# Patient Record
Sex: Female | Born: 1995 | Race: Black or African American | Hispanic: No | Marital: Married | Smoking: Never smoker
Health system: Southern US, Community
[De-identification: ages and names within clinical notes are randomized; demographics above are authoritative.]

## PROBLEM LIST (undated history)

## (undated) DIAGNOSIS — I10 Essential (primary) hypertension: Secondary | ICD-10-CM

## (undated) DIAGNOSIS — N289 Disorder of kidney and ureter, unspecified: Secondary | ICD-10-CM

## (undated) DIAGNOSIS — D649 Anemia, unspecified: Secondary | ICD-10-CM

## (undated) DIAGNOSIS — N390 Urinary tract infection, site not specified: Secondary | ICD-10-CM

## (undated) DIAGNOSIS — G988 Other disorders of nervous system: Secondary | ICD-10-CM

## (undated) DIAGNOSIS — R519 Headache, unspecified: Secondary | ICD-10-CM

## (undated) DIAGNOSIS — E119 Type 2 diabetes mellitus without complications: Secondary | ICD-10-CM

## (undated) DIAGNOSIS — G709 Myoneural disorder, unspecified: Secondary | ICD-10-CM

## (undated) HISTORY — DX: Other disorders of nervous system: G98.8

## (undated) HISTORY — DX: Essential (primary) hypertension: I10

## (undated) HISTORY — PX: INSERTION OF DIALYSIS CATHETER: SHX1324

## (undated) HISTORY — PX: OTHER SURGICAL HISTORY: SHX169

---
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
## 2013-03-04 DIAGNOSIS — I10 Essential (primary) hypertension: Secondary | ICD-10-CM

## 2013-03-04 HISTORY — DX: Essential (primary) hypertension: I10

## 2013-03-06 DIAGNOSIS — F331 Major depressive disorder, recurrent, moderate: Secondary | ICD-10-CM | POA: Insufficient documentation

## 2013-04-13 DIAGNOSIS — G629 Polyneuropathy, unspecified: Secondary | ICD-10-CM | POA: Insufficient documentation

## 2013-04-13 DIAGNOSIS — R634 Abnormal weight loss: Secondary | ICD-10-CM | POA: Insufficient documentation

## 2015-05-03 DIAGNOSIS — Z975 Presence of (intrauterine) contraceptive device: Secondary | ICD-10-CM | POA: Insufficient documentation

## 2015-08-13 DIAGNOSIS — Z8619 Personal history of other infectious and parasitic diseases: Secondary | ICD-10-CM | POA: Insufficient documentation

## 2015-09-09 ENCOUNTER — Other Ambulatory Visit: Payer: Self-pay | Admitting: Emergency Medicine

## 2015-09-09 ENCOUNTER — Inpatient Hospital Stay
Admission: EM | Admit: 2015-09-09 | Discharge: 2015-09-10 | DRG: 637 | Disposition: A | Discharge status: Home / Self Care | Payer: Medicaid Other | Attending: Specialist | Admitting: Specialist

## 2015-09-09 ENCOUNTER — Emergency Department: Payer: Medicaid Other

## 2015-09-09 ENCOUNTER — Encounter: Payer: Self-pay | Admitting: Emergency Medicine

## 2015-09-09 DIAGNOSIS — E1010 Type 1 diabetes mellitus with ketoacidosis without coma: Principal | ICD-10-CM | POA: Diagnosis present

## 2015-09-09 DIAGNOSIS — Z833 Family history of diabetes mellitus: Secondary | ICD-10-CM | POA: Diagnosis not present

## 2015-09-09 DIAGNOSIS — Z9119 Patient's noncompliance with other medical treatment and regimen: Secondary | ICD-10-CM

## 2015-09-09 DIAGNOSIS — Z803 Family history of malignant neoplasm of breast: Secondary | ICD-10-CM

## 2015-09-09 DIAGNOSIS — D72829 Elevated white blood cell count, unspecified: Secondary | ICD-10-CM | POA: Diagnosis present

## 2015-09-09 DIAGNOSIS — E870 Hyperosmolality and hypernatremia: Secondary | ICD-10-CM | POA: Diagnosis present

## 2015-09-09 DIAGNOSIS — E860 Dehydration: Secondary | ICD-10-CM | POA: Diagnosis present

## 2015-09-09 DIAGNOSIS — N170 Acute kidney failure with tubular necrosis: Secondary | ICD-10-CM | POA: Diagnosis present

## 2015-09-09 DIAGNOSIS — Z794 Long term (current) use of insulin: Secondary | ICD-10-CM

## 2015-09-09 DIAGNOSIS — E871 Hypo-osmolality and hyponatremia: Secondary | ICD-10-CM | POA: Diagnosis present

## 2015-09-09 DIAGNOSIS — E1110 Type 2 diabetes mellitus with ketoacidosis without coma: Secondary | ICD-10-CM | POA: Diagnosis present

## 2015-09-09 HISTORY — DX: Type 2 diabetes mellitus without complications: E11.9

## 2015-09-09 LAB — BASIC METABOLIC PANEL
Anion gap: 12 (ref 5–15)
Anion gap: 10 (ref 5–15)
BUN: 23 mg/dL — AB (ref 6–20)
BUN: 21 mg/dL — AB (ref 6–20)
CALCIUM: 8.6 mg/dL — AB (ref 8.9–10.3)
CALCIUM: 8.5 mg/dL — AB (ref 8.9–10.3)
CO2: 16 mmol/L — AB (ref 22–32)
CO2: 19 mmol/L — AB (ref 22–32)
CREATININE: 0.87 mg/dL (ref 0.44–1.00)
CREATININE: 0.86 mg/dL (ref 0.44–1.00)
Chloride: 107 mmol/L (ref 101–111)
Chloride: 106 mmol/L (ref 101–111)
GFR calc Af Amer: >60 mL/min (ref >60)
GFR calc Af Amer: >60 mL/min (ref >60)
GFR calc non Af Amer: >60 mL/min (ref >60)
GFR calc non Af Amer: >60 mL/min (ref >60)
GLUCOSE: 257 mg/dL — AB (ref 65–99)
GLUCOSE: 194 mg/dL — AB (ref 65–99)
Potassium: 4.4 mmol/L (ref 3.5–5.1)
Potassium: 4.5 mmol/L (ref 3.5–5.1)
Sodium: 135 mmol/L (ref 135–145)
Sodium: 135 mmol/L (ref 135–145)

## 2015-09-09 LAB — CBC
HCT: 39.9 % (ref 35.0–47.0)
Hemoglobin: 13.3 g/dL (ref 12.0–16.0)
MCH: 30.6 pg (ref 26.0–34.0)
MCHC: 33.4 g/dL (ref 32.0–36.0)
MCV: 91.6 fL (ref 80.0–100.0)
PLATELETS: 435 10*3/uL (ref 150–440)
RBC: 4.36 MIL/uL (ref 3.80–5.20)
RDW: 12.9 % (ref 11.5–14.5)
WBC: 21.2 10*3/uL — ABNORMAL HIGH (ref 3.6–11.0)

## 2015-09-09 LAB — POCT PREGNANCY, URINE: Preg Test, Ur: NEGATIVE

## 2015-09-09 LAB — MRSA PCR SCREENING: MRSA by PCR: NEGATIVE

## 2015-09-09 LAB — GLUCOSE, CAPILLARY
GLUCOSE-CAPILLARY: 406 mg/dL — AB (ref 65–99)
GLUCOSE-CAPILLARY: 309 mg/dL — AB (ref 65–99)
GLUCOSE-CAPILLARY: 247 mg/dL — AB (ref 65–99)
GLUCOSE-CAPILLARY: 201 mg/dL — AB (ref 65–99)
GLUCOSE-CAPILLARY: 167 mg/dL — AB (ref 65–99)
GLUCOSE-CAPILLARY: 175 mg/dL — AB (ref 65–99)
GLUCOSE-CAPILLARY: 152 mg/dL — AB (ref 65–99)
GLUCOSE-CAPILLARY: 176 mg/dL — AB (ref 65–99)
Glucose-Capillary: 118 mg/dL — ABNORMAL HIGH (ref 65–99)
Glucose-Capillary: 122 mg/dL — ABNORMAL HIGH (ref 65–99)
Glucose-Capillary: 133 mg/dL — ABNORMAL HIGH (ref 65–99)
Glucose-Capillary: 198 mg/dL — ABNORMAL HIGH (ref 65–99)
Glucose-Capillary: 483 mg/dL — ABNORMAL HIGH (ref 65–99)

## 2015-09-09 LAB — HEMOGLOBIN A1C: Hgb A1c MFr Bld: 15.1 % — ABNORMAL HIGH (ref 4.0–6.0)

## 2015-09-09 MED ORDER — ONDANSETRON HCL 4 MG PO TABS: 4.0000 mg | ORAL_TABLET | Freq: Four times a day (QID) | ORAL | Status: DC | PRN

## 2015-09-09 MED ORDER — ONDANSETRON HCL 4 MG/2ML IJ SOLN
4.0000 mg | Freq: Four times a day (QID) | INTRAMUSCULAR | Status: DC | PRN
  Administered 2015-09-09: 4 mg via INTRAVENOUS
  Filled 2015-09-09: qty 2

## 2015-09-09 MED ORDER — LIVING WELL WITH DIABETES BOOK
Freq: Once | Status: AC
  Administered 2015-09-09: 09:00:00
  Filled 2015-09-09 (×2): qty 1

## 2015-09-09 MED ORDER — ONDANSETRON HCL 4 MG/2ML IJ SOLN
INTRAMUSCULAR | Status: AC
  Filled 2015-09-09: qty 2

## 2015-09-09 MED ORDER — SODIUM CHLORIDE 0.9 % IV SOLN
INTRAVENOUS | Status: DC
  Administered 2015-09-09: 4.3 [IU]/h via INTRAVENOUS
  Filled 2015-09-09: qty 2.5

## 2015-09-09 MED ORDER — ACETAMINOPHEN 650 MG RE SUPP: 650.0000 mg | Freq: Four times a day (QID) | RECTAL | Status: DC | PRN

## 2015-09-09 MED ORDER — SODIUM CHLORIDE 0.9 % IV SOLN: INTRAVENOUS | Status: DC

## 2015-09-09 MED ORDER — PNEUMOCOCCAL VAC POLYVALENT 25 MCG/0.5ML IJ INJ
0.5000 mL | INJECTION | INTRAMUSCULAR | Status: AC
  Administered 2015-09-10: 0.5 mL via INTRAMUSCULAR
  Filled 2015-09-09: qty 0.5

## 2015-09-09 MED ORDER — ACETAMINOPHEN 325 MG PO TABS
650.0000 mg | ORAL_TABLET | Freq: Four times a day (QID) | ORAL | Status: DC | PRN
  Administered 2015-09-09 – 2015-09-10 (×3): 650 mg via ORAL
  Filled 2015-09-09 (×3): qty 2

## 2015-09-09 MED ORDER — METOCLOPRAMIDE HCL 5 MG/ML IJ SOLN
INTRAMUSCULAR | Status: AC
  Filled 2015-09-09: qty 2

## 2015-09-09 MED ORDER — SODIUM CHLORIDE 0.9 % IV BOLUS (SEPSIS)
1000.0000 mL | Freq: Once | INTRAVENOUS | Status: AC
  Administered 2015-09-09: 1000 mL via INTRAVENOUS

## 2015-09-09 MED ORDER — ONDANSETRON HCL 4 MG/2ML IJ SOLN
4.0000 mg | Freq: Once | INTRAMUSCULAR | Status: AC
  Administered 2015-09-09: 4 mg via INTRAVENOUS

## 2015-09-09 MED ORDER — FENTANYL CITRATE (PF) 100 MCG/2ML IJ SOLN
50.0000 ug | Freq: Once | INTRAMUSCULAR | Status: AC
  Administered 2015-09-09: 50 ug via INTRAVENOUS

## 2015-09-09 MED ORDER — SODIUM CHLORIDE 0.9 % IV SOLN
INTRAVENOUS | Status: DC
  Administered 2015-09-09: 4.2 [IU]/h via INTRAVENOUS
  Filled 2015-09-09: qty 2.5

## 2015-09-09 MED ORDER — SODIUM CHLORIDE 0.9 % IV SOLN: INTRAVENOUS | Status: AC

## 2015-09-09 MED ORDER — INSULIN DETEMIR 100 UNIT/ML ~~LOC~~ SOLN
16.0000 [IU] | Freq: Two times a day (BID) | SUBCUTANEOUS | Status: DC
  Administered 2015-09-09 – 2015-09-10 (×2): 16 [IU] via SUBCUTANEOUS
  Filled 2015-09-09 (×3): qty 0.16

## 2015-09-09 MED ORDER — DEXTROSE-NACL 5-0.45 % IV SOLN
INTRAVENOUS | Status: DC
  Administered 2015-09-09: 15:00:00 via INTRAVENOUS

## 2015-09-09 MED ORDER — POTASSIUM CHLORIDE 10 MEQ/100ML IV SOLN
10.0000 meq | INTRAVENOUS | Status: AC
  Administered 2015-09-09 (×2): 10 meq via INTRAVENOUS
  Filled 2015-09-09 (×2): qty 100

## 2015-09-09 MED ORDER — FENTANYL CITRATE (PF) 100 MCG/2ML IJ SOLN
INTRAMUSCULAR | Status: AC
  Filled 2015-09-09: qty 2

## 2015-09-09 MED ORDER — ENOXAPARIN SODIUM 40 MG/0.4ML ~~LOC~~ SOLN
40.0000 mg | SUBCUTANEOUS | Status: DC
  Administered 2015-09-09: 40 mg via SUBCUTANEOUS
  Filled 2015-09-09: qty 0.4

## 2015-09-09 MED ORDER — METOCLOPRAMIDE HCL 5 MG/ML IJ SOLN
10.0000 mg | Freq: Once | INTRAMUSCULAR | Status: AC
  Administered 2015-09-09: 10 mg via INTRAVENOUS

## 2015-09-09 NOTE — Care Management Note (Signed)
Case Management Note  Patient Details  Name: Emma Thomas MRN: [id number] Date of Birth: [birth year]  Subjective/Objective:    Case management referral received for [facility] verification. Patient DOES have an active Medicaid Policy.  Patient reports she just doesn't take her medication as prescribed. PCP is Dr. Derrel Nip. No additional needs identified.               Action/Plan:   Expected Discharge Date:                  Expected Discharge Plan:  Home/Self Care  In-House Referral:     Discharge planning Services  CM Consult  Post Acute Care Choice:    Choice offered to:     DME Arranged:    DME Agency:     HH Arranged:    HH Agency:     Status of Service:  In process, will continue to follow  Medicare Important Message Given:    Date Medicare IM Given:    Medicare IM give by:    Date Additional Medicare IM Given:    Additional Medicare Important Message give by:     If discussed at [place] of Stay Meetings, dates discussed:    Additional Comments:  Jolly Mango, RN 09/09/2015, 3:22 PM

## 2015-09-09 NOTE — ED Provider Notes (Signed)
[facility] Emergency Department Provider Note  ____________________________________________  Time seen: Approximately 6:15 AM  I have reviewed the triage vital signs and the nursing notes.   HISTORY  Chief Complaint Hyperglycemia    HPI Emma Thomas is a 19 y.o. female with a history of diabetes who comes in today with nausea and vomiting. The patient reports that she's been feeling this way for the last day. The patient also endorses some shortness of breath and some chest pain. The patient reports that she's had no diarrhea or abdominal pain no problems with her urination. When asked if she is taking her insulin she reports that she has not been taking her insulin like she is supposed to for a while. The patient's emesis is nonbilious and nonbloody. The patient reports he just does not feel well so she decided to come in for evaluation. The patient also endorses some increased thirst since yesterday as well. She's had no headache or blurred vision, no problems with urination.   Past Medical History  Diagnosis Date  . Diabetes mellitus without complication ([place])     There are no active problems to display for this patient.   History reviewed. No pertinent past surgical history.  Current Outpatient Rx  Name  Route  Sig  Dispense  Refill  . insulin aspart protamine- aspart (NOVOLOG MIX 70/30) (70-30) 100 UNIT/ML injection   Subcutaneous   Inject 40 Units into the skin daily with breakfast.         . insulin aspart protamine- aspart (NOVOLOG MIX 70/30) (70-30) 100 UNIT/ML injection   Subcutaneous   Inject 22 Units into the skin daily with supper.           Allergies Review of patient's allergies indicates no known allergies.  No family history on file.  Social History Social History  Substance Use Topics  . Smoking status: Never Smoker   . Smokeless tobacco: None  . Alcohol Use: No    Review of Systems Constitutional: No  fever/chills Eyes: No visual changes. ENT: No sore throat. Cardiovascular:  chest pain. Respiratory:  shortness of breath. Gastrointestinal: Nausea and vomiting with No abdominal pain,  No diarrhea.  No constipation. Genitourinary: Negative for dysuria. Musculoskeletal: back pain. Skin: Negative for rash. Neurological: Negative for headaches, focal weakness or numbness. 10-point ROS otherwise negative.  ____________________________________________   PHYSICAL EXAM:  VITAL SIGNS: ED Triage Vitals  Enc Vitals Group     BP 09/09/15 0615 140/88 mmHg     Pulse --      Resp 09/09/15 0615 22     Temp --      Temp Source 09/09/15 0615 Oral     SpO2 09/09/15 0615 98 %     Weight 09/09/15 0615 150 lb (68.04 kg)     Height 09/09/15 0615 5\' 2"  (1.575 m)     Head Cir --      Peak Flow --      Pain Score 09/09/15 0616 7     Pain Loc --      Pain Edu? --      Excl. in [place]? --     Constitutional: Alert and oriented. Well appearing and in moderate distress. Eyes: Conjunctivae are normal. PERRL. EOMI. Head: Atraumatic. Nose: No congestion/rhinnorhea. Mouth/Throat: Mucous membranes are moist.  Oropharynx non-erythematous. Cardiovascular: Tachycardia regular rhythm. Grossly normal heart sounds.  Good peripheral circulation. Respiratory: Normal respiratory effort.  No retractions. Lungs CTAB. Gastrointestinal: Soft and nontender. No distention. Positive bowel sounds  Musculoskeletal: No lower extremity tenderness nor edema.   Neurologic:  Normal speech and language. No gross focal neurologic deficits are appreciated.  Skin:  Skin is warm, dry and intact.  Psychiatric: Mood and affect are normal.   ____________________________________________   LABS (all labs ordered are listed, but only abnormal results are displayed)  Labs Reviewed  CBC  URINALYSIS COMPLETEWITH MICROSCOPIC ([place])  COMPREHENSIVE METABOLIC PANEL  LIPASE, BLOOD  TROPONIN I  BLOOD GAS, VENOUS  CBG MONITORING,  ED   ____________________________________________  EKG  ED ECG REPORT I, Loney Hering, the attending physician, personally viewed and interpreted this ECG.   Date: 09/09/2015  EKG Time: 616  Rate: 108  Rhythm: sinus tachycardia  Axis: none  Intervals: prolonged qtc  ST&T Change: none  ____________________________________________  RADIOLOGY  CXR: NAD ____________________________________________   PROCEDURES  Procedure(s) performed: None  Critical Care performed: Yes, see critical care note(s)  CRITICAL CARE Performed by: Charlesetta Ivory P   Total critical care time: 45 min  Critical care time was exclusive of separately billable procedures and treating other patients.  Critical care was necessary to treat or prevent imminent or life-threatening deterioration.  Critical care was time spent personally by me on the following activities: development of treatment plan with patient and/or surrogate as well as nursing, discussions with consultants, evaluation of patient's response to treatment, examination of patient, obtaining history from patient or surrogate, ordering and performing treatments and interventions, ordering and review of laboratory studies, ordering and review of radiographic studies, pulse oximetry and re-evaluation of patient's condition.  ____________________________________________   INITIAL IMPRESSION / ASSESSMENT AND PLAN / ED COURSE  Pertinent labs & imaging results that were available during my care of the patient were reviewed by me and considered in my medical decision making (see chart for details).  This is a 19 year old female with a history of diabetes who comes in today with vomiting and nausea as well as increased thirst and high blood sugars. My concern is the patient may be in diabetic ketoacidosis. We'll check some blood work, a urinalysis, a chest x-ray and reassess the patient. The patient will receive a liter of normal saline  while we are waiting the results of her blood work. The patient received a second Liter of NS. I will start the patient on an insulin drip and admit her to the hospitalist service. He received a dose of Reglan as well as a dose of Zofran. ____________________________________________   FINAL CLINICAL IMPRESSION(S) / ED DIAGNOSES  Final diagnoses:  Diabetic ketoacidosis without coma associated with type 1 diabetes mellitus ([place])      Loney Hering, MD 09/09/15 [phone]

## 2015-09-09 NOTE — H&P (Signed)
[facility] [place] NAME: Rylei Giasson    MR#:  [id number]  DATE OF BIRTH:  02-06-1996  DATE OF ADMISSION:  09/09/2015  PRIMARY CARE PHYSICIAN: [facility]   REQUESTING/REFERRING PHYSICIAN: Dr. Mariea Clonts  CHIEF COMPLAINT:   Chief Complaint  Patient presents with  . Hyperglycemia   acute DKA  HISTORY OF PRESENT ILLNESS:  Jamilee Volner  is a 19 y.o. female with a known history of type 1 diabetes who presents to the hospital due to ongoing nausea vomiting for the past day. Patient says that she has not been compliant in taking her insulin like she was supposed to. She has also not been checking her blood sugars on a regular basis. Given her worsening nausea vomiting and increasing thirst she came to the ER for further evaluation and was noted to be in acute diabetic ketoacidosis. Hospitalist services were contacted further treatment and evaluation. Patient denies any fevers chills chest pain, abdominal pain, diarrhea or any other sick contacts.  PAST MEDICAL HISTORY:   Past Medical History  Diagnosis Date  . Diabetes mellitus without complication ([place])     PAST SURGICAL HISTORY:  History reviewed. No pertinent past surgical history.  SOCIAL HISTORY:   Social History  Substance Use Topics  . Smoking status: Never Smoker   . Smokeless tobacco: Not on file  . Alcohol Use: No    FAMILY HISTORY:   Family History  Problem Relation Age of Onset  . Breast cancer Mother   . Diabetes type II Brother     DRUG ALLERGIES:  No Known Allergies  REVIEW OF SYSTEMS:   Review of Systems  Constitutional: Negative for fever and weight loss.  HENT: Negative for congestion, nosebleeds and tinnitus.   Eyes: Negative for blurred vision, double vision and redness.  Respiratory: Negative for cough, hemoptysis and shortness of breath.   Cardiovascular: Negative for chest pain, orthopnea, leg swelling and PND.   Gastrointestinal: Positive for nausea and vomiting. Negative for abdominal pain, diarrhea and melena.  Genitourinary: Negative for dysuria, urgency and hematuria.  Musculoskeletal: Negative for joint pain and falls.  Neurological: Negative for dizziness, tingling, sensory change, focal weakness, seizures, weakness and headaches.  Endo/Heme/Allergies: Negative for polydipsia. Does not bruise/bleed easily.  Psychiatric/Behavioral: Negative for depression and memory loss. The patient is not nervous/anxious.     MEDICATIONS AT HOME:   Prior to Admission medications   Medication Sig Start Date End Date Taking? Authorizing Provider  insulin aspart protamine- aspart (NOVOLOG MIX 70/30) (70-30) 100 UNIT/ML injection Inject 40 Units into the skin daily with breakfast.   Yes Historical Provider, MD  insulin aspart protamine- aspart (NOVOLOG MIX 70/30) (70-30) 100 UNIT/ML injection Inject 22 Units into the skin daily with supper.   Yes Historical Provider, MD      VITAL SIGNS:  Blood pressure 127/79, pulse 108, resp. rate 18, height 5\' 2"  (1.575 m), weight 68.04 kg (150 lb), SpO2 99 %.  PHYSICAL EXAMINATION:  Physical Exam  GENERAL:  19 y.o.-year-old patient lying in the bed with no acute distress.  EYES: Pupils equal, round, reactive to light and accommodation. No scleral icterus. Extraocular muscles intact.  HEENT: Head atraumatic, normocephalic. Oropharynx and nasopharynx clear. No oropharyngeal erythema, moist oral mucosa  NECK:  Supple, no jugular venous distention. No thyroid enlargement, no tenderness.  LUNGS: Normal breath sounds bilaterally, no wheezing, rales, rhonchi. No use of accessory muscles of respiration.  CARDIOVASCULAR: S1, S2 RRR. No murmurs,  rubs, gallops, clicks.  ABDOMEN: Soft, nontender, nondistended. Bowel sounds present. No organomegaly or mass.  EXTREMITIES: No pedal edema, cyanosis, or clubbing. + 2 pedal & radial pulses b/l.   NEUROLOGIC: Cranial nerves II through  XII are intact. No focal Motor or sensory deficits appreciated b/l PSYCHIATRIC: The patient is alert and oriented x 3. Good affect.  SKIN: No obvious rash, lesion, or ulcer.   LABORATORY PANEL:   CBC No results for input(s): WBC, HGB, HCT, PLT in the last 168 hours. ------------------------------------------------------------------------------------------------------------------  Chemistries  No results for input(s): NA, K, CL, CO2, GLUCOSE, BUN, CREATININE, CALCIUM, MG, AST, ALT, ALKPHOS, BILITOT in the last 168 hours.  Invalid input(s): GFRCGP ------------------------------------------------------------------------------------------------------------------  Cardiac Enzymes No results for input(s): TROPONINI in the last 168 hours. ------------------------------------------------------------------------------------------------------------------  RADIOLOGY:  No results found.   IMPRESSION AND PLAN:   20 year old female with past medical history of type 1 diabetes who presents to the hospital due to nausea vomiting and noted to be in acute diabetic ketoacidosis.  #1 acute DKA-this is likely secondary to patient's noncompliance. Patient has not been taking her insulin and following a strict diet. -We'll hydrate her with IV fluids, start insulin drip DKA protocol. Follow serial metabolic profiles. -Continue insulin drip until the anion gap closes. We'll add potassium to her IV fluids once it falls below 4.5. -I will get an endocrinology consult as patient wants to see somebody locally. -Check a hemoglobin A1c.  #2 leukocytosis-likely stress mediated. -No evidence of acute infectious source. Follow white cell count.  #3 acute renal failure-likely acute tubular necrosis from dehydration and DKA. -Hydrate her with IV fluids and follow BUN and creatinine.  #4 hyponatremia-likely hyperosmolar secondary to the hyperglycemia. -Should improve as the blood sugars correct.   All the  records are reviewed and case discussed with ED provider. Management plans discussed with the patient, family and they are in agreement.  CODE STATUS: Full  TOTAL Critical Care TIME TAKING CARE OF THIS PATIENT: 45 minutes.    Henreitta Leber M.D on 09/09/2015 at 9:06 AM  Between 7am to 6pm - Pager - [phone]  After 6pm go to [email or web address] - password [email or web address] [place] [facility]  Office  [phone]  CC: Primary care physician; [place]

## 2015-09-09 NOTE — Consult Note (Signed)
Endocrine Initial Consult Note Date of Consult: 09/09/2015  Consulting Service: [facility] Endocrinology  Consulting MD: Dr. Verdell Carmine  SUBJECTIVE: Reason for Consultation: DKA, uncontrolled diabetes The history was obtained from the chart. Patient was not arousable during interview.   History of Present Illness: Emma Thomas is a 19 y.o. female with uncontrolled type 1 DM and history of nonadherence admitted with nausea/vomiting and found to be in DKA. She was admitted to the ICU and is currently on IV insulin and fluids with anion gap closed. She was unable to provide any history. Per her RN, patient had received zofran not long before my visit. She is currently NPO. Fingerstick BG 133.  Per the EMR, she is on a regimen of mixed insulin 70/30 40 units before breakfast and 22 units before supper. The most recent Hb A1c was 15.1%.    ROS could not be completed due to patient's somnolence.   Patient Active Problem List   Diagnosis Date Noted  . DKA (diabetic ketoacidoses) ([place]) 09/09/2015     Past Medical History  Diagnosis Date  . Diabetes mellitus without complication ([place])    History reviewed. No pertinent past surgical history. Family History  Problem Relation Age of Onset  . Breast cancer Mother   . Diabetes type II Brother     Social History:  Social History  Substance Use Topics  . Smoking status: Never Smoker   . Smokeless tobacco: Not on file  . Alcohol Use: No    No Known Allergies   Medications:  70/30 mixed insulin, 40 units in am, 22 units in pm  OBJECTIVE: Temp:  [98.8 F (37.1 C)] 98.8 F (37.1 C) (10/24 1032) Pulse Rate:  [108-120] 109 (10/24 1700) Resp:  [16-25] 19 (10/24 1700) BP: (102-140)/(63-88) 102/65 mmHg (10/24 1700) SpO2:  [97 %-100 %] 98 % (10/24 1700) Weight:  [68.04 kg (150 lb)] 68.04 kg (150 lb) (10/24 0615)  Temp (24hrs), Avg:98.8 F (37.1 C), Min:98.8 F (37.1 C), Max:98.8 F (37.1 C)  Weight: 68.04 kg (150 lb)  Physical  Exam: limited Gen: lying in bed asleep, not arousable, mumbling but no coherent speech HEENT: NCAT, limited exam CVS: tachycardic, reg rhythm, no murmur Lung: CTAB Abd: soft, benign Extr: warm, well-perfused, no obvious lesions Skin: warm dry Neuro: could not assess  Labs: BMP Latest Ref Rng 09/09/2015 09/09/2015  Glucose 65 - 99 mg/dL 194(H) 257(H)  BUN 6 - 20 mg/dL 21(H) 23(H)  Creatinine 0.44 - 1.00 mg/dL 0.86 0.87  Sodium 135 - 145 mmol/L 135 135  Potassium 3.5 - 5.1 mmol/L 4.5 4.4  Chloride 101 - 111 mmol/L 106 107  CO2 22 - 32 mmol/L 19(L) 16(L)  Calcium 8.9 - 10.3 mg/dL 8.5(L) 8.6(L)    CBC Latest Ref Rng 09/09/2015  WBC 3.6 - 11.0 K/uL 21.2(H)  Hemoglobin 12.0 - 16.0 g/dL 13.3  Hematocrit 35.0 - 47.0 % 39.9  Platelets 150 - 440 K/uL 435   Component     Latest Ref Rng 09/09/2015  Hemoglobin A1C     4.0 - 6.0 % 15.1 (H)   Blood glucose values reviewed in glucose accordion view  ASSESSMENT:  1. DKA - resolved, anion gap now 12 2. Type 1 diabetes, uncontrolled  RECOMMENDATIONS:   Start Levemir 16 units subQ every 12 hours; overlap with insulin infusion by 2 hours then discontinue insulin gtt Check fingerstick BG hourly x 3 hours after insulin gtt discontinued Transition to [facility] and HS fingerstick bg Once diet is advanced, would start  Novolog 10 units with meals, hold if PO intake poor Continue IV fluids, D5 1/2 NS Plan reviewed with patient's nurse Will follow along and coordinate follow up post-discharge Thank you for allowing me to participate in this patient's care.   Atha Starks, MD [facility] Endocrinology

## 2015-09-09 NOTE — ED Notes (Addendum)
Pt to room 8 via EMS from home; c/o N/V, back pain and increased thirst since yesterday; last FSBS Sund am was 300's and took ds insulin at that time--none since; +BS, abd soft/nondist, c/o diffuse discomfort with palpation of abdomen; pt also reports "hasn't been taking insulin like she is supposed to"; when asked why, st "I just don't"

## 2015-09-09 NOTE — ED Notes (Signed)
Pt has completed two 1065ml bags of normal saline per order. See mar.

## 2015-09-09 NOTE — ED Notes (Signed)
Attempt to call report to ICU, RN unavailable to take report at this time.

## 2015-09-09 NOTE — ED Notes (Signed)
Attempts made by this RN and medic for second iv site unsuccessful.

## 2015-09-09 NOTE — ED Notes (Signed)
Blood gas sample obtained for resp  By this RN. Stacy from resp collected sample from room.

## 2015-09-09 NOTE — Progress Notes (Signed)
Patient was admitted with DKA and started on an insulin drip. In reviewing the chart, noted in H&P that patient "has not been compliant in taking her insulin like she was supposed to. She has also not been checking her blood sugars on a regular basis". Went to talk with patient regarding diabetes and home regimen for diabetes control. Patient kept her eyes closed the entire time I was visiting with her and only answered my questions and did not contribute anything extra to our conversation. Patient stated that she has Medicaid (which is not reflected in the chart at this time) and that gets her insulin from [facility]. Patient states that she takes 70/30 40 units with breakfast and 70/30 22 units with supper. Patient reports that she has everything she needs for diabetes management (insulin, syringes, glucometer, and testing supplies) and she does not have any problems with getting any of those things noted.  Patient reports that she sees Dr. Hoy Morn (PCP) for diabetes management. Inquired about an endocrinologist and patient states that she needs a local endocrinologist. Informed patient that Dr. Gabriel Carina is consulted to see her as an inpatient. Informed patient that diabetes coordinator will be following along while she is inpatient and that diabetes coordinator will revisit with her tomorrow. Patient verbalized understanding of information discussed and states that she has no questions at this time.  Thanks, Barnie Alderman, RN, MSN, CCRN, CDE Diabetes Coordinator Inpatient Diabetes Program [phone] (Team Pager from [place] to [place]) [phone] ([place] office) [phone] [facility] office) [phone] [facility] office)

## 2015-09-10 LAB — BASIC METABOLIC PANEL
ANION GAP: 9 (ref 5–15)
BUN: 16 mg/dL (ref 6–20)
CO2: 21 mmol/L — AB (ref 22–32)
Calcium: 8.4 mg/dL — ABNORMAL LOW (ref 8.9–10.3)
Chloride: 103 mmol/L (ref 101–111)
Creatinine, Ser: 0.65 mg/dL (ref 0.44–1.00)
GFR calc Af Amer: >60 mL/min (ref >60)
GFR calc non Af Amer: >60 mL/min (ref >60)
GLUCOSE: 235 mg/dL — AB (ref 65–99)
POTASSIUM: 4.1 mmol/L (ref 3.5–5.1)
Sodium: 133 mmol/L — ABNORMAL LOW (ref 135–145)

## 2015-09-10 LAB — GLUCOSE, CAPILLARY
GLUCOSE-CAPILLARY: 483 mg/dL — AB (ref 65–99)
GLUCOSE-CAPILLARY: 236 mg/dL — AB (ref 65–99)
Glucose-Capillary: 253 mg/dL — ABNORMAL HIGH (ref 65–99)

## 2015-09-10 LAB — CBC
HCT: 38.3 % (ref 35.0–47.0)
HEMOGLOBIN: 13 g/dL (ref 12.0–16.0)
MCH: 31.1 pg (ref 26.0–34.0)
MCHC: 34 g/dL (ref 32.0–36.0)
MCV: 91.4 fL (ref 80.0–100.0)
Platelets: 368 10*3/uL (ref 150–440)
RBC: 4.2 MIL/uL (ref 3.80–5.20)
RDW: 12.7 % (ref 11.5–14.5)
WBC: 9.6 10*3/uL (ref 3.6–11.0)

## 2015-09-10 MED ORDER — INSULIN ASPART 100 UNIT/ML ~~LOC~~ SOLN: 0.0000 [IU] | Freq: Three times a day (TID) | SUBCUTANEOUS | Status: DC

## 2015-09-10 MED ORDER — INSULIN ASPART 100 UNIT/ML ~~LOC~~ SOLN
0.0000 [IU] | Freq: Three times a day (TID) | SUBCUTANEOUS | Status: DC
  Administered 2015-09-10: 5 [IU] via SUBCUTANEOUS
  Administered 2015-09-10: 3 [IU] via SUBCUTANEOUS
  Filled 2015-09-10: qty 5
  Filled 2015-09-10: qty 3

## 2015-09-10 MED ORDER — INSULIN DETEMIR 100 UNIT/ML FLEXPEN: 16.0000 [IU] | PEN_INJECTOR | Freq: Two times a day (BID) | SUBCUTANEOUS | Status: DC

## 2015-09-10 MED ORDER — INSULIN ASPART 100 UNIT/ML ~~LOC~~ SOLN
8.0000 [IU] | Freq: Three times a day (TID) | SUBCUTANEOUS | Status: DC
  Administered 2015-09-10: 8 [IU] via SUBCUTANEOUS
  Filled 2015-09-10: qty 8

## 2015-09-10 MED ORDER — INSULIN ASPART 100 UNIT/ML FLEXPEN: 8.0000 [IU] | PEN_INJECTOR | Freq: Three times a day (TID) | SUBCUTANEOUS | Status: DC

## 2015-09-10 MED ORDER — INSULIN ASPART 100 UNIT/ML ~~LOC~~ SOLN: 0.0000 [IU] | Freq: Every day | SUBCUTANEOUS | Status: DC

## 2015-09-10 NOTE — Progress Notes (Addendum)
Patient discharge. Discharge instructions given - verbal and hard copy, patient verbalized information back, all questions answered. Friend at side. Ambulates w/o difficulty. No needs. Prescriptions given.  Patient aware to keep a log of her blood sugars. Patient did not want to wait for a wheelchair and ambulated to exit with friend at side.

## 2015-09-10 NOTE — Progress Notes (Signed)
Insulin drip off at 2000. Morning levimir given. Should go to floor today.

## 2015-09-10 NOTE — Discharge Summary (Signed)
[facility] NAME: Emma Thomas    MR#:  [id number]  DATE OF BIRTH:  [birth year]  DATE OF ADMISSION:  09/09/2015 ADMITTING PHYSICIAN: Henreitta Leber, MD  DATE OF DISCHARGE: 09/10/2015 12:32 PM  PRIMARY CARE PHYSICIAN: [facility]    ADMISSION DIAGNOSIS:  Diabetic ketoacidosis without coma associated with type 1 diabetes mellitus ([place]) [E10.10]  DISCHARGE DIAGNOSIS:  Active Problems:   DKA (diabetic ketoacidoses) ([place])   SECONDARY DIAGNOSIS:   Past Medical History  Diagnosis Date  . Diabetes mellitus without complication [facility])     HOSPITAL COURSE:   19 year old female with past medical history of type 1 diabetes who presents to the hospital due to nausea vomiting and noted to be in acute diabetic ketoacidosis.  #1 acute DKA-this is likely secondary to patient's noncompliance. Patient had not been taking her insulin and following a strict diet. -Patient was admitted to the hospital and started on aggressive IV fluids, insulin drip. After getting aggressive therapy her anion gap has closed. Her clinical symptoms of nausea and vomiting and thirst have improved. -Patient was seen by endocrinology and he recommended switching her from her insulin 75/25 to Levemir and NovoLog with meals. Patient's hemoglobin A1c was noted to be as high as 11. -Patient currently is tolerating diet well. She is being discharged on Levemir 16 units twice daily along with NovoLog with meals and follow up with endocrinology as an outpatient.  #2 leukocytosis-likely stress mediated and has normalized now. -No evidence of acute infectious source.  #3 acute renal failure-likely acute tubular necrosis from dehydration and DKA. -Improved and normalized with IV fluid hydration.  #4 hyponatremia-likely hyperosmolar secondary to the hyperglycemia. -Corrected as her hyperglycemia has improved  DISCHARGE CONDITIONS:    Stable  CONSULTS OBTAINED:  Treatment Team:  Judi Cong, MD Abby Percell Locus, MD  DRUG ALLERGIES:  No Known Allergies  DISCHARGE MEDICATIONS:   Discharge Medication List as of 09/10/2015 12:13 PM    START taking these medications   Details  insulin aspart (NOVOLOG FLEXPEN) 100 UNIT/ML FlexPen Inject 8 Units into the skin 3 (three) times daily with meals., Starting 09/10/2015, Until Discontinued, Print    Insulin Detemir (LEVEMIR FLEXPEN) 100 UNIT/ML Pen Inject 16 Units into the skin 2 (two) times daily., Starting 09/10/2015, Until Discontinued, Print      STOP taking these medications     insulin aspart protamine- aspart (NOVOLOG MIX 70/30) (70-30) 100 UNIT/ML injection      insulin aspart protamine- aspart (NOVOLOG MIX 70/30) (70-30) 100 UNIT/ML injection          DISCHARGE INSTRUCTIONS:   DIET:  Diabetic diet  DISCHARGE CONDITION:  Stable  ACTIVITY:  Activity as tolerated  OXYGEN:  Home Oxygen: No.   Oxygen Delivery: room air  DISCHARGE LOCATION:  home   If you experience worsening of your admission symptoms, develop shortness of breath, life threatening emergency, suicidal or homicidal thoughts you must seek medical attention immediately by calling 911 or calling your MD immediately  if symptoms less severe.  You Must read complete instructions/literature along with all the possible adverse reactions/side effects for all the Medicines you take and that have been prescribed to you. Take any new Medicines after you have completely understood and accpet all the possible adverse reactions/side effects.   Please note  You were cared for by a hospitalist during your hospital stay. If you have any questions about your discharge medications or the  care you received while you were in the hospital after you are discharged, you can call the unit and asked to speak with the hospitalist on call if the hospitalist that took care of you is not available. Once you  are discharged, your primary care physician will handle any further medical issues. Please note that NO REFILLS for any discharge medications will be authorized once you are discharged, as it is imperative that you return to your primary care physician (or establish a relationship with a primary care physician if you do not have one) for your aftercare needs so that they can reassess your need for medications and monitor your lab values.     Today   No complaints presently. no nausea, vomiting. off the insulin drip and tolerating by mouth well.  VITAL SIGNS:  Blood pressure 108/72, pulse 96, temperature 98 F (36.7 C), temperature source Oral, resp. rate 20, height 5\' 2"  (1.575 m), weight 68.04 kg (150 lb), SpO2 99 %.  I/O:   Intake/Output Summary (Last 24 hours) at 09/10/15 1436 Last data filed at 09/10/15 0200  Gross per 24 hour  Intake 1628.24 ml  Output   1250 ml  Net 378.24 ml    PHYSICAL EXAMINATION:   GENERAL:  19 y.o.-year-old patient lying in the bed with no acute distress.  EYES: Pupils equal, round, reactive to light and accommodation. No scleral icterus. Extraocular muscles intact.  HEENT: Head atraumatic, normocephalic. Oropharynx and nasopharynx clear.  NECK:  Supple, no jugular venous distention. No thyroid enlargement, no tenderness.  LUNGS: Normal breath sounds bilaterally, no wheezing, rales,rhonchi. No use of accessory muscles of respiration.  CARDIOVASCULAR: S1, S2 normal. No murmurs, rubs, or gallops.  ABDOMEN: Soft, non-tender, non-distended. Bowel sounds present. No organomegaly or mass.  EXTREMITIES: No pedal edema, cyanosis, or clubbing.  NEUROLOGIC: Cranial nerves II through XII are intact. No focal motor or sensory defecits b/l.  PSYCHIATRIC: The patient is alert and oriented x 3. Good affect.  SKIN: No obvious rash, lesion, or ulcer.   DATA REVIEW:   CBC  Recent Labs Lab 09/10/15 0552  WBC 9.6  HGB 13.0  HCT 38.3  PLT 368    Chemistries    Recent Labs Lab 09/10/15 0552  NA 133*  K 4.1  CL 103  CO2 21*  GLUCOSE 235*  BUN 16  CREATININE 0.65  CALCIUM 8.4*    Cardiac Enzymes No results for input(s): TROPONINI in the last 168 hours.  Microbiology Results  Results for orders placed or performed during the hospital encounter of 09/09/15  MRSA PCR Screening     Status: None   Collection Time: 09/09/15  6:07 AM  Result Value Ref Range Status   MRSA by PCR NEGATIVE NEGATIVE Final    Comment:        The GeneXpert MRSA Assay (FDA approved for NASAL specimens only), is one component of a comprehensive MRSA colonization surveillance program. It is not intended to diagnose MRSA infection nor to guide or monitor treatment for MRSA infections.     RADIOLOGY:  Dg Chest Portable 1 View  09/09/2015  CLINICAL DATA:  Sob and chest pain. Vomiting x 1 day. Back pain since getting sick. Diabetic. Stated her symptoms were because her blood surgar way high. EXAM: PORTABLE CHEST 1 VIEW COMPARISON:  None. FINDINGS: The heart size and mediastinal contours are within normal limits. Both lungs are clear. The visualized skeletal structures are unremarkable. IMPRESSION: No active disease. Electronically Signed   By: Lajean Manes  M.D.   On: 09/09/2015 09:35      Management plans discussed with the patient, family and they are in agreement.  CODE STATUS:     Code Status Orders        Start     Ordered   09/09/15 1103  Full code   Continuous     09/09/15 1102      TOTAL TIME TAKING CARE OF THIS PATIENT: 40 minutes.    Henreitta Leber M.D on 09/10/2015 at 2:36 PM  Between 7am to 6pm - Pager - [phone]  After 6pm go to [email or web address] - password [email or web address] [place] [facility]  Office  [phone]  CC: Primary care physician; [place]

## 2015-09-10 NOTE — Progress Notes (Signed)
S: Ms. Isenberg reports she is feeling better today but still no appetite. She denies nausea or vomiting but had a headache earlier. She did not eat breakfast. Transitioned from IV insulin to subQ yesterday pm. BG mildly elevated this am. No hypoglycemia events. She will likely be discharged today.  O:  Filed Vitals:   09/10/15 0300 09/10/15 0400 09/10/15 0500 09/10/15 0600  BP: 111/72 98/62 104/74   Pulse: 94 95 92 92  Temp:      TempSrc:      Resp: 17 16 17 13   Height:      Weight:      SpO2: 99% 97% 99% 99%   Gen: well-appearing, conversant CVS: RRR, s1, s2 no murmur Lung: CTAB Abd: soft, normoactive bowel sounds, nontender Extr: no edema, wwp Psych: good insight into her medical condition  BMP Latest Ref Rng 09/10/2015 09/09/2015 09/09/2015  Glucose 65 - 99 mg/dL 235(H) 194(H) 257(H)  BUN 6 - 20 mg/dL 16 21(H) 23(H)  Creatinine 0.44 - 1.00 mg/dL 0.65 0.86 0.87  Sodium 135 - 145 mmol/L 133(L) 135 135  Potassium 3.5 - 5.1 mmol/L 4.1 4.5 4.4  Chloride 101 - 111 mmol/L 103 106 107  CO2 22 - 32 mmol/L 21(L) 19(L) 16(L)  Calcium 8.9 - 10.3 mg/dL 8.4(L) 8.5(L) 8.6(L)   CBC Latest Ref Rng 09/10/2015 09/09/2015  WBC 3.6 - 11.0 K/uL 9.6 21.2(H)  Hemoglobin 12.0 - 16.0 g/dL 13.0 13.3  Hematocrit 35.0 - 47.0 % 38.3 39.9  Platelets 150 - 440 K/uL 368 435   Blood glucose reviewed in glucose accordion view   ASSESSMENT:  1. DKA - resolved  2. Type 1 diabetes, uncontrolled  RECOMMENDATIONS:   Continue Levemir 16 units subQ every 12 hours  Start Novolog 8 units TIDCC. Hold if not eating. Correction scale upon discharge: 3 units/50 BG>200 mg/dL Check fingerstick BG AC/HS Discontinue IV fluids including dextrose Will see her in follow up next week She will need prescriptions for the following upon discharge: Insulin pens, pen needles, test strips, lancets, glucometer  Thank you for allowing me to participate in this patients care. Atha Starks, MD [facility] Endocr[facility]

## 2015-09-10 NOTE — Progress Notes (Addendum)
Inpatient Diabetes Program Recommendations  AACE/ADA: New Consensus Statement on Inpatient Glycemic Control (2015)  Target Ranges:  Prepandial:   less than 140 mg/dL      Peak postprandial:   less than 180 mg/dL (1-2 hours)      Critically ill patients:  140 - 180 mg/dL  Results for Emma Thomas, Emma Thomas (MRN [id number]) as of 09/10/2015 07:50  Ref. Range 09/09/2015 15:56 09/09/2015 17:09 09/09/2015 18:07 09/09/2015 19:46 09/09/2015 21:05 09/09/2015 22:08 09/09/2015 23:47 09/10/2015 07:12  Glucose-Capillary Latest Ref Range: 65-99 mg/dL 175 (H) 133 (H) 122 (H) 118 (H) 152 (H) 176 (H) 198 (H) 236 (H)   Review of Glycemic Control  Diabetes history: DM1 Outpatient Diabetes medications: 70/30 40 units with breakfast and 70/30 22 units with supper Current orders for Inpatient glycemic control: Levemir 16 units BID  Inpatient Diabetes Program Recommendations: IV fluids: If patient is eating and drinking without any issues, please consider discontinuing dextrose in IV fluids. Correction (SSI): Patient has Type 1 diabetes and will require Novolog correction. Please order Novolog sensitive correction scale ACHS. Insulin - Meal Coverage: Patient has Type 1 diabetes and will require meal coverage. If patient is eating at least 50% of meals, please order Novolog 10 units TID with meals as recommended by Dr. Graceann Congress.  Note: Patient received Levemir 16 units on 09/09/15 at 17:42 and was transitioned off IV insulin 2 hours after receiving Levemir. Fasting glucose 236 mg/dl at 7:12 this morning and patient received Levemir 16 units at 6:09 am today. Since patient has Type 1 diabetes she has an absolute insulin deficiency and will require both correction and meal coverage insulin.   09/10/15@8 :10-Talked with Dr. Verdell Carmine regarding recommendations. Order given for Novolog sensitive correction scale only. Dr. Verdell Carmine plans to discharge patient today and will call Dr. Graceann Congress to discuss discharge plan for patient  (whether she will discharge back on 70/30 or change to Levemir and Novolog).  Talked with Maudie Mercury, RN to update on plan and orders.  Thanks, Barnie Alderman, RN, MSN, CCRN, CDE Diabetes Coordinator Inpatient Diabetes Program [phone] (Team Pager from [place] to [place]) [phone] ([place] office) [phone] [facility] office) [phone] [facility] office)

## 2015-09-19 LAB — BLOOD GAS, VENOUS
Acid-base deficit: 20.2 mmol/L — ABNORMAL HIGH (ref 0.0–2.0)
Bicarbonate: 9.4 mEq/L — ABNORMAL LOW (ref 21.0–28.0)
FIO2: 0.21
PH VEN: 7.05 — AB (ref 7.250–7.300)
Patient temperature: 37
pCO2, Ven: 34 mmHg — ABNORMAL LOW (ref 44.0–60.0)

## 2015-11-12 ENCOUNTER — Encounter: Payer: Self-pay | Admitting: Emergency Medicine

## 2015-11-12 ENCOUNTER — Emergency Department
Admission: EM | Admit: 2015-11-12 | Discharge: 2015-11-12 | Disposition: A | Discharge status: Home / Self Care | Payer: Medicaid Other | Attending: Emergency Medicine | Admitting: Emergency Medicine

## 2015-11-12 DIAGNOSIS — E669 Obesity, unspecified: Secondary | ICD-10-CM | POA: Insufficient documentation

## 2015-11-12 DIAGNOSIS — Z794 Long term (current) use of insulin: Secondary | ICD-10-CM | POA: Diagnosis not present

## 2015-11-12 DIAGNOSIS — E119 Type 2 diabetes mellitus without complications: Secondary | ICD-10-CM | POA: Diagnosis not present

## 2015-11-12 DIAGNOSIS — L0231 Cutaneous abscess of buttock: Secondary | ICD-10-CM | POA: Insufficient documentation

## 2015-11-12 MED ORDER — LIDOCAINE-EPINEPHRINE (PF) 1 %-1:200000 IJ SOLN
INTRAMUSCULAR | Status: AC
  Filled 2015-11-12: qty 30

## 2015-11-12 MED ORDER — LIDOCAINE-EPINEPHRINE (PF) 2 %-1:200000 IJ SOLN: 30.0000 mL | Freq: Once | INTRAMUSCULAR | Status: DC

## 2015-11-12 MED ORDER — OXYCODONE-ACETAMINOPHEN 7.5-325 MG PO TABS: 1.0000 | ORAL_TABLET | ORAL | Status: DC | PRN

## 2015-11-12 MED ORDER — LIDOCAINE HCL (PF) 1 % IJ SOLN
INTRAMUSCULAR | Status: AC
  Filled 2015-11-12: qty 5

## 2015-11-12 MED ORDER — SULFAMETHOXAZOLE-TRIMETHOPRIM 800-160 MG PO TABS: 1.0000 | ORAL_TABLET | Freq: Two times a day (BID) | ORAL | Status: DC

## 2015-11-12 NOTE — ED Notes (Signed)
Patient ambulatory to triage with steady gait, without difficulty or distress noted; pt reports abscess to right buttock since yesterday, hx of same

## 2015-11-12 NOTE — ED Notes (Signed)
Pt discharged home after verbalizing understanding of discharge instructions; nad noted. 

## 2015-11-12 NOTE — Discharge Instructions (Signed)
Percutaneous Abscess Drain, Care After °Refer to this sheet in the next few weeks. These instructions provide you with information on caring for yourself after your procedure. Your health care provider may also give you more specific instructions. Your treatment has been planned according to current medical practices, but problems sometimes occur. Call your health care provider if you have any problems or questions after your procedure. °WHAT TO EXPECT AFTER THE PROCEDURE °After your procedure, it is typical to have the following:  °· A small amount of discomfort in the area where the drainage tube was placed. °· A small amount of bruising around the area where the drainage tube was placed. °· Sleepiness and fatigue for the rest of the day from the medicines used. °HOME CARE INSTRUCTIONS °· Rest at home for 1-2 days following your procedure or as directed by your health care provider. °· If you go home right after the procedure, plan to have someone with you for 24 hours. °· Do not take a bath or shower for 24 hours after your procedure. °· Take medicines only as directed by your health care provider. Ask your health care provider when you can resume taking any normal medicines. °· Change bandages (dressings) as directed.   °· You may be told to record the amount of drainage from the bag every time you empty it. Follow your health care provider's directions for emptying the bag. Write down the amount of drainage, the date, and the time you emptied it. °· Call your health care provider when the drain is putting out less than 10 mL of drainage per day for 2-3 days in a row or as directed by your health care provider. °· Follow your health care provider's instructions for cleaning the drainage tube. You may need to clean the tube every day so that it does not clog. °SEEK MEDICAL CARE IF: °· You have increased bleeding (more than a small spot) from the site where the drainage tube was placed. °· You have redness,  swelling, or increasing pain around the site where the drainage tube was placed. °· You notice a discharge or bad smell coming from the site where the drainage tube was placed. °· You have a fever or chills.  °· You have pain that is not helped by medicine.   °SEEK IMMEDIATE MEDICAL CARE IF: °· There is leakage around the drainage tube. °· The drainage tube pulls out. °· You suddenly stop having drainage from the tube. °· You suddenly have blood in the drainage fluid. °· You become dizzy or faint. °· You develop a rash.   °· You have nausea or vomiting. °· You have difficulty breathing, feel short of breath, or feel faint.   °· You develop chest pain. °· You have problems with your speech or vision. °· You have trouble balancing or moving your arms or legs. °  °This information is not intended to replace advice given to you by your health care provider. Make sure you discuss any questions you have with your health care provider. °  °Document Released: 03/19/2014 Document Revised: 08/21/2014 Document Reviewed: 03/19/2014 °Elsevier Interactive Patient Education ©2016 [employer] Inc[employer] ° °

## 2015-11-12 NOTE — ED Provider Notes (Signed)
[facility] Emergency Department Provider Note  ____________________________________________  Time seen: Approximately 7:12 AM  I have reviewed the triage vital signs and the nursing notes.   HISTORY  Chief Complaint Abscess    HPI Emma Thomas is a 19 y.o. female patient complaining of pain and swelling to the right buttocks. Patient states she noticed a swelling yesterday. Patient states she has a history of abscess. Patient rated her pain discomfort as 8/10 no palliative measures taken for this complaint.   Past Medical History  Diagnosis Date  . Diabetes mellitus without complication [facility])     Patient Active Problem List   Diagnosis Date Noted  . DKA (diabetic ketoacidoses) ([place]) 09/09/2015    History reviewed. No pertinent past surgical history.  Current Outpatient Rx  Name  Route  Sig  Dispense  Refill  . insulin aspart (NOVOLOG FLEXPEN) 100 UNIT/ML FlexPen   Subcutaneous   Inject 8 Units into the skin 3 (three) times daily with meals.   15 mL   11   . Insulin Detemir (LEVEMIR FLEXPEN) 100 UNIT/ML Pen   Subcutaneous   Inject 16 Units into the skin 2 (two) times daily.   15 mL   11   . oxyCODONE-acetaminophen (PERCOCET) 7.5-325 MG tablet   Oral   Take 1 tablet by mouth every 4 (four) hours as needed for severe pain.   20 tablet   0   . sulfamethoxazole-trimethoprim (BACTRIM DS,SEPTRA DS) 800-160 MG tablet   Oral   Take 1 tablet by mouth 2 (two) times daily.   20 tablet   0     Allergies Review of patient's allergies indicates no known allergies.  Family History  Problem Relation Age of Onset  . Breast cancer Mother   . Diabetes type II Brother     Social History Social History  Substance Use Topics  . Smoking status: Never Smoker   . Smokeless tobacco: None  . Alcohol Use: No    Review of Systems Constitutional: No fever/chills Eyes: No visual changes. ENT: No sore throat. Cardiovascular: Denies chest  pain. Respiratory: Denies shortness of breath. Gastrointestinal: No abdominal pain.  No nausea, no vomiting.  No diarrhea.  No constipation. Genitourinary: Negative for dysuria. Musculoskeletal: Negative for back pain. Skin: Negative for rash. Swelling right buttocks. Neurological: Negative for headaches, focal weakness or numbness. Endocrine: Obese 10-point ROS otherwise negative.  ____________________________________________   PHYSICAL EXAM:  VITAL SIGNS: ED Triage Vitals  Enc Vitals Group     BP 11/12/15 0634 117/75 mmHg     Pulse Rate 11/12/15 0634 100     Resp 11/12/15 0634 18     Temp 11/12/15 0634 97.9 F (36.6 C)     Temp Source 11/12/15 0634 Oral     SpO2 11/12/15 0634 100 %     Weight 11/12/15 0634 160 lb (72.576 kg)     Height 11/12/15 0634 5\' 6"  (1.676 m)     Head Cir --      Peak Flow --      Pain Score 11/12/15 0633 8     Pain Loc --      Pain Edu? --      Excl. in [place]? --     Constitutional: Alert and oriented. Well appearing and in no acute distress. Eyes: Conjunctivae are normal. PERRL. EOMI. Head: Atraumatic. Nose: No congestion/rhinnorhea. Mouth/Throat: Mucous membranes are moist.  Oropharynx non-erythematous. Neck: No stridor. No cervical spine tenderness to palpation. Hematological/Lymphatic/Immunilogical: No cervical lymphadenopathy. Cardiovascular:  Normal rate, regular rhythm. Grossly normal heart sounds.  Good peripheral circulation. Respiratory: Normal respiratory effort.  No retractions. Lungs CTAB. Gastrointestinal: Soft and nontender. No distention. No abdominal bruits. No CVA tenderness. Musculoskeletal: No lower extremity tenderness nor edema.  No joint effusions. Neurologic:  Normal speech and language. No gross focal neurologic deficits are appreciated. No gait instability. Skin:  Skin is warm, dry and intact. No rash noted. Nodule lesion right buttocks Psychiatric: Mood and affect are normal. Speech and behavior are  normal.  ____________________________________________   LABS (all labs ordered are listed, but only abnormal results are displayed)  Labs Reviewed  WOUND CULTURE   ____________________________________________  EKG   ____________________________________________  RADIOLOGY   ____________________________________________   PROCEDURES  Procedure(s) performed: See procedure note  Critical Care performed: No  __________________INCISION AND DRAINAGE Performed by: Sable Feil Consent: Verbal consent obtained. Risks and benefits: risks, benefits and alternatives were discussed Type: abscess  Body area: Right buttocks Anesthesia: local infiltration  Incision was made with a scalpel.  Local anesthetic: lidocaine 1% with epinephrine  Anesthetic total: 8 mL ml  Complexity: complex Blunt dissection to break up loculations  Drainage: purulent.   Drainage amount: Small Packing material: 1/4 in iodoform gauze  Patient tolerance: Patient tolerated the procedure well with no immediate complications.   __________________________   INITIAL IMPRESSION / ASSESSMENT AND PLAN / ED COURSE  Pertinent labs & imaging results that were available during my care of the patient were reviewed by me and considered in my medical decision making (see chart for details).  Abscess right buttocks. Area was I&D. Cultures were taken. She given discharge Instructions and a prescription for Percocet and Bactrim. Patient advised to follow-up 20 file for wound check. ____________________________________________   FINAL CLINICAL IMPRESSION(S) / ED DIAGNOSES  Final diagnoses:  Cutaneous abscess of buttock      Sable Feil, PA-C 11/12/15 0800  Earleen Newport, MD 11/12/15 [phone]

## 2015-11-12 NOTE — ED Notes (Signed)
Pt presents with abscess to buttock. Pt has had these in the past. Complains that unable to sit down on it and it has gotten larger in last 24 hours.

## 2015-11-17 LAB — WOUND CULTURE

## 2015-11-18 NOTE — Progress Notes (Signed)
ED Antimicrobial Stewardship Positive Culture Follow Up   Emma Thomas is an 20 y.o. female who presented to [facility] on 11/12/2015 with a chief complaint of  Chief Complaint  Patient presents with  . Abscess    Recent Results (from the past 720 hour(s))  Wound culture     Status: None   Collection Time: 11/12/15  7:52 AM  Result Value Ref Range Status   Specimen Description BUTTOCKS  Final   Special Requests BUTTOCK  Final   Gram Stain MODERATE WBC SEEN RARE GRAM NEGATIVE RODS   Final   Culture   Final    LIGHT GROWTH GROUP B STREP(S.AGALACTIAE)ISOLATED Virtually 100% of S. agalactiae (Group B) strains are susceptible to Penicillin.  For Penicillin-allergic patients, Erythromycin (85-95% sensitive) and Clindamycin (80% sensitive) are drugs of choice. Contact microbiology lab to request sensitivities if  needed within 7 days. WITH NORMAL SKIN FLORA    Report Status 11/17/2015 FINAL  Final    [x]  Treated with sulfamethoxazole-trimethoprim 800/160 1 tab PO BID x 10 days, organism resistant to prescribed antimicrobial []  Patient discharged originally without antimicrobial agent and treatment is now indicated  New antibiotic prescription: Faxed results to patient's PCP Ishmael Holter [place]) to consider transitioning to Amoxicillin 500 mg PO BID x 10 days if they felt appropriate.     Gricel Copen G 11/18/2015, 2:22 PM

## 2016-01-11 ENCOUNTER — Emergency Department
Admission: EM | Admit: 2016-01-11 | Discharge: 2016-01-12 | Disposition: A | Discharge status: Home / Self Care | Payer: Medicaid Other | Attending: Emergency Medicine | Admitting: Emergency Medicine

## 2016-01-11 ENCOUNTER — Encounter: Payer: Self-pay | Admitting: Emergency Medicine

## 2016-01-11 DIAGNOSIS — L0291 Cutaneous abscess, unspecified: Secondary | ICD-10-CM

## 2016-01-11 DIAGNOSIS — L02416 Cutaneous abscess of left lower limb: Secondary | ICD-10-CM | POA: Insufficient documentation

## 2016-01-11 DIAGNOSIS — R739 Hyperglycemia, unspecified: Secondary | ICD-10-CM

## 2016-01-11 DIAGNOSIS — E1165 Type 2 diabetes mellitus with hyperglycemia: Secondary | ICD-10-CM | POA: Insufficient documentation

## 2016-01-11 DIAGNOSIS — Z794 Long term (current) use of insulin: Secondary | ICD-10-CM | POA: Insufficient documentation

## 2016-01-11 LAB — COMPREHENSIVE METABOLIC PANEL
ALK PHOS: 109 U/L (ref 38–126)
ALT: 11 U/L — AB (ref 14–54)
AST: 14 U/L — ABNORMAL LOW (ref 15–41)
Albumin: 3.7 g/dL (ref 3.5–5.0)
Anion gap: 13 (ref 5–15)
BUN: 13 mg/dL (ref 6–20)
CALCIUM: 9.1 mg/dL (ref 8.9–10.3)
CHLORIDE: 94 mmol/L — AB (ref 101–111)
CO2: 21 mmol/L — ABNORMAL LOW (ref 22–32)
CREATININE: 0.76 mg/dL (ref 0.44–1.00)
Glucose, Bld: 569 mg/dL (ref 65–99)
Potassium: 4.4 mmol/L (ref 3.5–5.1)
Sodium: 128 mmol/L — ABNORMAL LOW (ref 135–145)
TOTAL PROTEIN: 7.6 g/dL (ref 6.5–8.1)
Total Bilirubin: 1.2 mg/dL (ref 0.3–1.2)

## 2016-01-11 LAB — CBC WITH DIFFERENTIAL/PLATELET
Basophils Absolute: 0.1 10*3/uL (ref 0–0.1)
Basophils Relative: 1 %
EOS PCT: 1 %
Eosinophils Absolute: 0.1 10*3/uL (ref 0–0.7)
HCT: 40.9 % (ref 35.0–47.0)
Hemoglobin: 13.7 g/dL (ref 12.0–16.0)
LYMPHS ABS: 1.3 10*3/uL (ref 1.0–3.6)
LYMPHS PCT: 12 %
MCH: 30.1 pg (ref 26.0–34.0)
MCHC: 33.6 g/dL (ref 32.0–36.0)
MCV: 89.9 fL (ref 80.0–100.0)
MONO ABS: 0.7 10*3/uL (ref 0.2–0.9)
Monocytes Relative: 7 %
Neutro Abs: 8.9 10*3/uL — ABNORMAL HIGH (ref 1.4–6.5)
Neutrophils Relative %: 79 %
PLATELETS: 342 10*3/uL (ref 150–440)
RBC: 4.55 MIL/uL (ref 3.80–5.20)
RDW: 12.8 % (ref 11.5–14.5)
WBC: 11.1 10*3/uL — ABNORMAL HIGH (ref 3.6–11.0)

## 2016-01-11 LAB — BLOOD GAS, VENOUS
Acid-base deficit: 4.2 mmol/L — ABNORMAL HIGH (ref 0.0–2.0)
Bicarbonate: 20.1 mEq/L — ABNORMAL LOW (ref 21.0–28.0)
O2 Saturation: 88.1 %
PCO2 VEN: 34 mmHg — AB (ref 44.0–60.0)
PH VEN: 7.38 (ref 7.320–7.430)
Patient temperature: 37
pO2, Ven: <31 mmHg (ref 30.0–45.0)

## 2016-01-11 LAB — GLUCOSE, CAPILLARY: Glucose-Capillary: 531 mg/dL — ABNORMAL HIGH (ref 65–99)

## 2016-01-11 MED ORDER — INSULIN ASPART 100 UNIT/ML ~~LOC~~ SOLN: 10.0000 [IU] | Freq: Once | SUBCUTANEOUS | Status: DC

## 2016-01-11 MED ORDER — SODIUM CHLORIDE 0.9 % IV BOLUS (SEPSIS)
1000.0000 mL | Freq: Once | INTRAVENOUS | Status: AC
  Administered 2016-01-11: 1000 mL via INTRAVENOUS

## 2016-01-11 MED ORDER — SULFAMETHOXAZOLE-TRIMETHOPRIM 800-160 MG PO TABS
1.0000 | ORAL_TABLET | Freq: Once | ORAL | Status: AC
  Administered 2016-01-12: 1 via ORAL
  Filled 2016-01-11: qty 1

## 2016-01-11 MED ORDER — LIDOCAINE-EPINEPHRINE 1 %-1:100000 IJ SOLN
10.0000 mL | Freq: Once | INTRAMUSCULAR | Status: AC
  Administered 2016-01-11: 10 mL

## 2016-01-11 MED ORDER — SODIUM CHLORIDE 0.9 % IV BOLUS (SEPSIS): 1000.0000 mL | Freq: Once | INTRAVENOUS | Status: DC

## 2016-01-11 MED ORDER — LIDOCAINE-EPINEPHRINE (PF) 1 %-1:200000 IJ SOLN
INTRAMUSCULAR | Status: AC
  Filled 2016-01-11: qty 30

## 2016-01-11 NOTE — ED Notes (Signed)
Pt states is diabetic and not taking her insulin.

## 2016-01-11 NOTE — ED Notes (Signed)
Pt with abscess to upper inner left thigh for two days, area is approx 1.5 inches in diameter, pt denies known fever, no drainage noted.

## 2016-01-11 NOTE — ED Provider Notes (Signed)
[facility] Emergency Department Provider Note  Time seen: 11:32 PM  I have reviewed the triage vital signs and the nursing notes.   HISTORY  Chief Complaint Abscess    HPI Emma Thomas is a 20 y.o. female with a past medical history of diabetes who presents the emergency department with elevated blood glucose and an abscess to her left thigh. According to the patient for the past 2 days she has noticed a painful red swollen area to her left medial thigh. Denies fever, nausea, vomiting. Patient is also noted her blood glucose has been in the 4-500 range. She states she ran out of insulin 3 days ago. She recently got a job and her Medicaid ran out, she has no way to obtain her insulin at this time. Denies any abdominal pain, chest pain. Describes the pain over the abscess on the left thigh as moderate, worse if she moves or touches it.     Past Medical History  Diagnosis Date  . Diabetes mellitus without complication [facility])     Patient Active Problem List   Diagnosis Date Noted  . DKA (diabetic ketoacidoses) ([place]) 09/09/2015    No past surgical history on file.  Current Outpatient Rx  Name  Route  Sig  Dispense  Refill  . insulin aspart (NOVOLOG FLEXPEN) 100 UNIT/ML FlexPen   Subcutaneous   Inject 8 Units into the skin 3 (three) times daily with meals.   15 mL   11   . Insulin Detemir (LEVEMIR FLEXPEN) 100 UNIT/ML Pen   Subcutaneous   Inject 16 Units into the skin 2 (two) times daily.   15 mL   11   . oxyCODONE-acetaminophen (PERCOCET) 7.5-325 MG tablet   Oral   Take 1 tablet by mouth every 4 (four) hours as needed for severe pain.   20 tablet   0   . sulfamethoxazole-trimethoprim (BACTRIM DS,SEPTRA DS) 800-160 MG tablet   Oral   Take 1 tablet by mouth 2 (two) times daily.   20 tablet   0     Allergies Review of patient's allergies indicates no known allergies.  Family History  Problem Relation Age of Onset  . Breast cancer  Mother   . Diabetes type II Brother     Social History Social History  Substance Use Topics  . Smoking status: Never Smoker   . Smokeless tobacco: Never Used  . Alcohol Use: No    Review of Systems Constitutional: Negative for fever. Cardiovascular: Negative for chest pain. Respiratory: Negative for shortness of breath. Gastrointestinal: Negative for abdominal pain, vomiting and diarrhea. Skin: Abscess to left thigh. Neurological: Negative for headache 10-point ROS otherwise negative.  ____________________________________________   PHYSICAL EXAM:  VITAL SIGNS: ED Triage Vitals  Enc Vitals Group     BP 01/11/16 2105 149/95 mmHg     Pulse Rate 01/11/16 2105 100     Resp 01/11/16 2105 18     Temp 01/11/16 2105 98.6 F (37 C)     Temp Source 01/11/16 2105 Oral     SpO2 01/11/16 2105 100 %     Weight 01/11/16 2105 144 lb (65.318 kg)     Height 01/11/16 2105 5\' 6"  (1.676 m)     Head Cir --      Peak Flow --      Pain Score 01/11/16 2106 8     Pain Loc --      Pain Edu? --      Excl. in [place]? --  Constitutional: Alert and oriented. Well appearing and in no distress. Eyes: Normal exam ENT   Head: Normocephalic and atraumatic   Mouth/Throat: Mucous membranes are moist. Cardiovascular: Normal rate, regular rhythm. No murmur Respiratory: Normal respiratory effort without tachypnea nor retractions. Breath sounds are clear  Gastrointestinal: Soft and nontender. No distention. Musculoskeletal: Nontender with normal range of motion in all extremities Neurologic:  Normal speech and language. No gross focal neurologic deficits  Skin:  1.5 x 2 cm area of erythema, swelling, tenderness, mild fluctuance of the left medial proximal thigh. Psychiatric: Mood and affect are normal. Speech and behavior are normal.  ____________________________________________     INITIAL IMPRESSION / ASSESSMENT AND PLAN / ED COURSE  Pertinent labs & imaging results that were available  during my care of the patient were reviewed by me and considered in my medical decision making (see chart for details).  Patient with likely 1.5 x 2 cm abscess to left medial proximal thigh. Mild fluctuance, we will evaluate with bedside ultrasound to ensure adequate fluid pocket for drainage. Patient has elevated blood glucose which is more likely related to the patient's noncompliance for the past 3 days. Patient ran out of insulin as her Medicaid ran out. Case management has worked with the patient has arranged for the patient to obtain insulin.   Incision and drainage performed by myself, approximately 15-20 cc of purulent exudate removed. We'll place patient on Bactrim. We'll recheck a fingerstick. Procedure well tolerated by patient.  Blood sugar currently 360 we'll continue with IV fluids and additional insulin dose into the blood glucose is under 300 and then the patient will be discharged home with Bactrim and a short course of pain medication. Case management is working with the patient to her insulin filled tomorrow.  Patient care signed out to Dr. Karma Greaser.     INCISION AND DRAINAGE Performed by: Harvest Dark Consent: Verbal consent obtained. Risks and benefits: risks, benefits and alternatives were discussed Type: abscess  Body area: left medial thight  Anesthesia: local infiltration  Incision was made with a scalpel.  Local anesthetic: lidocaine 1% with epinephrine  Anesthetic total: 8 ml  Complexity: complex Blunt dissection to break up loculations  Drainage: purulent  Drainage amount: 15cc purulent Patient tolerance: Patient tolerated the procedure well with no immediate complications.     ____________________________________________   FINAL CLINICAL IMPRESSION(S) / ED DIAGNOSES  Hyperglycemia Abscess   Harvest Dark, MD 01/12/16 0009

## 2016-01-11 NOTE — Care Management Note (Signed)
Case Management Note  Patient Details  Name: IMALAY BEMBENEK MRN: [id number] Date of Birth: [birth year]  Subjective/Objective:  20 y.o. F seen in the ED for elevated CBG ( 531). Reports she was using both Novolog and Humalin Insulin Pens but is "Losing her Medicaid" at the end of February and is no longer able to afford her medications.   Uses Novolog 8 Units with each meal. Will require Prescriptions for syringes, as well as, vial of Novolog at discharge so she can have this filled at her [facility] in [place] [phone])        Action/Plan: Will contact usual CM, Michelle in am for specific procedure in this county to [facility] patients requiring Medication assistance. No further CM needs at this time. Made patient as well as MD aware of plan.    Expected Discharge Date:                  Expected Discharge Plan:     In-House Referral:     Discharge planning Services     Post Acute Care Choice:    Choice offered to:     DME Arranged:    DME Agency:     HH Arranged:    [place] Agency:     Status of Service:     Medicare Important Message Given:    Date Medicare IM Given:    Medicare IM give by:    Date Additional Medicare IM Given:    Additional Medicare Important Message give by:     If discussed at [place] of Stay Meetings, dates discussed:    Additional Comments:  Delrae Sawyers, RN 01/11/2016, 11:03 PM

## 2016-01-11 NOTE — ED Notes (Signed)
Pt resting comfortably at this time.

## 2016-01-12 LAB — GLUCOSE, CAPILLARY
GLUCOSE-CAPILLARY: 308 mg/dL — AB (ref 65–99)
Glucose-Capillary: 370 mg/dL — ABNORMAL HIGH (ref 65–99)

## 2016-01-12 MED ORDER — HYDROCODONE-ACETAMINOPHEN 5-325 MG PO TABS: 1.0000 | ORAL_TABLET | ORAL | Status: DC | PRN

## 2016-01-12 MED ORDER — INSULIN ASPART 100 UNIT/ML ~~LOC~~ SOLN
6.0000 [IU] | Freq: Once | SUBCUTANEOUS | Status: AC
  Administered 2016-01-12: 6 [IU] via SUBCUTANEOUS
  Filled 2016-01-12: qty 6

## 2016-01-12 MED ORDER — SODIUM CHLORIDE 0.9 % IV BOLUS (SEPSIS)
1000.0000 mL | Freq: Once | INTRAVENOUS | Status: AC
Start: 2016-01-12 — End: 2016-01-12
  Administered 2016-01-12: 1000 mL via INTRAVENOUS

## 2016-01-12 MED ORDER — SULFAMETHOXAZOLE-TRIMETHOPRIM 800-160 MG PO TABS: 1.0000 | ORAL_TABLET | Freq: Two times a day (BID) | ORAL | Status: DC

## 2016-01-12 MED ORDER — HYDROCODONE-ACETAMINOPHEN 5-325 MG PO TABS
2.0000 | ORAL_TABLET | Freq: Once | ORAL | Status: AC
  Administered 2016-01-12: 2 via ORAL

## 2016-01-12 MED ORDER — HYDROCODONE-ACETAMINOPHEN 5-325 MG PO TABS
ORAL_TABLET | ORAL | Status: AC
  Administered 2016-01-12: 2 via ORAL
  Filled 2016-01-12: qty 2

## 2016-01-12 NOTE — Progress Notes (Addendum)
[place] letter completed. Will ascertain which pharmacies are participants in [place] area and fax to facilitate patient care. Spoke with Emma Thomas on the phone to keep her updated on the progress of [place] that we had talked about last pm. She was unable to tell me why the provider did not write Rx for Novolog Insulin last pm. CM suggested she call her PCP, [facility] in [place] to see if they had a provider on call that could call in her Prescription for  Novolog to a participating Pharmacy. If not she will have to call them first thing in am to avoid another ED visit. Made her aware that we would be unable to cover the cost of her pain meds Emma Thomas expressed understanding and appreciation. Understands she needs to complete all of antibiotics for infection.Also understands this is available once in 12 month period.  Will continue to follow.  Faxed [place] letter with follow up call to [facility] @ [facility] Phone [phone] fax: [phone]. Made Emma Thomas aware she could pick up her Rx there later today. No further CM needs.

## 2016-01-12 NOTE — ED Provider Notes (Signed)
-----------------------------------------   12:16 AM on 01/12/2016 -----------------------------------------   Blood pressure 149/95, pulse 100, temperature 98.6 F (37 C), temperature source Oral, resp. rate 18, height 5\' 6"  (1.676 m), weight 65.318 kg, last menstrual period 01/11/2016, SpO2 100 %.  Assuming care from Dr. Kerman Passey.  In short, Emma Thomas is a 20 y.o. female with a chief complaint of Abscess .  Refer to the original H&P for additional details.  The current plan of care is to monitor on fluids and discharge.  ----------------------------------------- 1:18 AM on 01/12/2016 -----------------------------------------  The patient's fingerstick blood sugars down to 308.  I authorized to [facility] for her pain.  We will discharge as per Dr. Lucilla Lame recommendations.  [name], MD 01/12/16 [phone]

## 2016-01-12 NOTE — Discharge Instructions (Signed)
Abscess An abscess (boil or furuncle) is an infected area on or under the skin. This area is filled with yellowish-white fluid (pus) and other material (debris). HOME CARE   Only take medicines as told by your doctor.  If you were given antibiotic medicine, take it as directed. Finish the medicine even if you start to feel better.  If gauze is used, follow your doctor's directions for changing the gauze.  To avoid spreading the infection:  Keep your abscess covered with a bandage.  Wash your hands well.  Do not share personal care items, towels, or whirlpools with others.  Avoid skin contact with others.  Keep your skin and clothes clean around the abscess.  Keep all doctor visits as told. GET HELP RIGHT AWAY IF:   You have more pain, puffiness (swelling), or redness in the wound site.  You have more fluid or blood coming from the wound site.  You have muscle aches, chills, or you feel sick.  You have a fever. MAKE SURE YOU:   Understand these instructions.  Will watch your condition.  Will get help right away if you are not doing well or get worse.   This information is not intended to replace advice given to you by your health care provider. Make sure you discuss any questions you have with your health care provider.   Document Released: 04/20/2008 Document Revised: 05/03/2012 Document Reviewed: 01/16/2012 Elsevier Interactive Patient Education 2016 Elsevier Inc.  Hyperglycemia High blood sugar (hyperglycemia) means that the level of sugar in your blood is higher than it should be. Signs of high blood sugar include:  Feeling thirsty.  Frequent peeing (urinating).  Feeling tired or sleepy.  Dry mouth.  Vision changes.  Feeling weak.  Feeling hungry but losing weight.  Numbness and tingling in your hands or feet.  Headache. When you ignore these signs, your blood sugar may keep going up. These problems may get worse, and other problems may  begin. HOME CARE  Check your blood sugars as told by your doctor. Write down the numbers with the date and time.  Take the right amount of insulin or diabetes pills at the right time. Write down the dose with date and time.  Refill your insulin or diabetes pills before running out.  Watch what you eat. Follow your meal plan.  Drink liquids without sugar, such as water. Check with your doctor if you have kidney or heart disease.  Follow your doctor's orders for exercise. Exercise at the same time of day.  Keep your doctor's appointments. GET HELP RIGHT AWAY IF:   You have trouble thinking or are confused.  You have fast breathing with fruity smelling breath.  You pass out (faint).  You have 2 to 3 days of high blood sugars and you do not know why.  You have chest pain.  You are feeling sick to your stomach (nauseous) or throwing up (vomiting).  You have sudden vision changes. MAKE SURE YOU:   Understand these instructions.  Will watch your condition.  Will get help right away if you are not doing well or get worse.   This information is not intended to replace advice given to you by your health care provider. Make sure you discuss any questions you have with your health care provider.   Document Released: 08/30/2009 Document Revised: 11/23/2014 Document Reviewed: 07/09/2015 Elsevier Interactive Patient Education [employer].

## 2016-01-12 NOTE — ED Notes (Signed)
Pt discharged to home.  Family member driving.  Discharge instructions reviewed.  Verbalized understanding.  No questions or concerns at this time.  Teach back verified.  Pt in NAD.  No items left in ED.   

## 2016-01-12 NOTE — Progress Notes (Signed)
Reviewed MATCH procedure for [facility] with Gary, the WE CM. Pt was discharged with pain meds and antibiotics. NO RX for Novolog and/or Syringes in discharge prescriptions. Will call pt later today and assess if she can make follow up appt with [facility] for Rxs. Unsure why Rxs were not written last pm as CM and MD had Face to face conversation about same. Will hold off on follow through until I can call [facility] or pt to provide closure.

## 2016-02-03 ENCOUNTER — Encounter: Payer: Self-pay | Admitting: Emergency Medicine

## 2016-02-03 ENCOUNTER — Inpatient Hospital Stay
Admission: EM | Admit: 2016-02-03 | Discharge: 2016-02-04 | DRG: 638 | Disposition: A | Discharge status: Home / Self Care | Payer: 59 | Attending: Internal Medicine | Admitting: Internal Medicine

## 2016-02-03 DIAGNOSIS — E860 Dehydration: Secondary | ICD-10-CM | POA: Diagnosis present

## 2016-02-03 DIAGNOSIS — E1110 Type 2 diabetes mellitus with ketoacidosis without coma: Secondary | ICD-10-CM | POA: Diagnosis present

## 2016-02-03 DIAGNOSIS — R112 Nausea with vomiting, unspecified: Secondary | ICD-10-CM

## 2016-02-03 DIAGNOSIS — E1010 Type 1 diabetes mellitus with ketoacidosis without coma: Principal | ICD-10-CM | POA: Diagnosis present

## 2016-02-03 DIAGNOSIS — Z794 Long term (current) use of insulin: Secondary | ICD-10-CM

## 2016-02-03 DIAGNOSIS — Z79899 Other long term (current) drug therapy: Secondary | ICD-10-CM

## 2016-02-03 DIAGNOSIS — Z833 Family history of diabetes mellitus: Secondary | ICD-10-CM

## 2016-02-03 DIAGNOSIS — Z803 Family history of malignant neoplasm of breast: Secondary | ICD-10-CM

## 2016-02-03 DIAGNOSIS — E871 Hypo-osmolality and hyponatremia: Secondary | ICD-10-CM | POA: Diagnosis present

## 2016-02-03 DIAGNOSIS — Z9114 Patient's other noncompliance with medication regimen: Secondary | ICD-10-CM

## 2016-02-03 LAB — BLOOD GAS, VENOUS
ACID-BASE DEFICIT: 24.5 mmol/L — AB (ref 0.0–2.0)
BICARBONATE: 6.7 meq/L — AB (ref 21.0–28.0)
O2 Saturation: 34.9 %
PATIENT TEMPERATURE: 37
PO2 VEN: 36 mmHg (ref 31.0–45.0)
pCO2, Ven: 30 mmHg — ABNORMAL LOW (ref 44.0–60.0)
pH, Ven: 6.96 — CL (ref 7.320–7.430)

## 2016-02-03 LAB — URINALYSIS COMPLETE WITH MICROSCOPIC (ARMC ONLY)
BILIRUBIN URINE: NEGATIVE
Bacteria, UA: NONE SEEN
Leukocytes, UA: NEGATIVE
Nitrite: NEGATIVE
PROTEIN: 100 mg/dL — AB
Specific Gravity, Urine: 1.021 (ref 1.005–1.030)
pH: 5 (ref 5.0–8.0)

## 2016-02-03 LAB — BASIC METABOLIC PANEL
Anion gap: 23 — ABNORMAL HIGH (ref 5–15)
Anion gap: 11 (ref 5–15)
BUN: 16 mg/dL (ref 6–20)
BUN: 17 mg/dL (ref 6–20)
CALCIUM: 9.5 mg/dL (ref 8.9–10.3)
CHLORIDE: 111 mmol/L (ref 101–111)
CO2: 8 mmol/L — AB (ref 22–32)
CO2: 12 mmol/L — ABNORMAL LOW (ref 22–32)
CREATININE: 1.06 mg/dL — AB (ref 0.44–1.00)
CREATININE: 0.74 mg/dL (ref 0.44–1.00)
Calcium: 8.7 mg/dL — ABNORMAL LOW (ref 8.9–10.3)
Chloride: 99 mmol/L — ABNORMAL LOW (ref 101–111)
GFR calc Af Amer: >60 mL/min (ref >60)
GFR calc Af Amer: >60 mL/min (ref >60)
GLUCOSE: 525 mg/dL — AB (ref 65–99)
Glucose, Bld: 197 mg/dL — ABNORMAL HIGH (ref 65–99)
Potassium: 4.4 mmol/L (ref 3.5–5.1)
Potassium: 4.1 mmol/L (ref 3.5–5.1)
SODIUM: 134 mmol/L — AB (ref 135–145)
Sodium: 130 mmol/L — ABNORMAL LOW (ref 135–145)

## 2016-02-03 LAB — CBC
HCT: 48.1 % — ABNORMAL HIGH (ref 35.0–47.0)
Hemoglobin: 15.6 g/dL (ref 12.0–16.0)
MCH: 30.2 pg (ref 26.0–34.0)
MCHC: 32.4 g/dL (ref 32.0–36.0)
MCV: 93.2 fL (ref 80.0–100.0)
PLATELETS: 416 10*3/uL (ref 150–440)
RBC: 5.16 MIL/uL (ref 3.80–5.20)
RDW: 13.6 % (ref 11.5–14.5)
WBC: 12 10*3/uL — ABNORMAL HIGH (ref 3.6–11.0)

## 2016-02-03 LAB — GLUCOSE, CAPILLARY
GLUCOSE-CAPILLARY: 452 mg/dL — AB (ref 65–99)
GLUCOSE-CAPILLARY: 204 mg/dL — AB (ref 65–99)
GLUCOSE-CAPILLARY: 184 mg/dL — AB (ref 65–99)
Glucose-Capillary: 164 mg/dL — ABNORMAL HIGH (ref 65–99)
Glucose-Capillary: 171 mg/dL — ABNORMAL HIGH (ref 65–99)
Glucose-Capillary: 175 mg/dL — ABNORMAL HIGH (ref 65–99)
Glucose-Capillary: 184 mg/dL — ABNORMAL HIGH (ref 65–99)
Glucose-Capillary: 217 mg/dL — ABNORMAL HIGH (ref 65–99)
Glucose-Capillary: 330 mg/dL — ABNORMAL HIGH (ref 65–99)
Glucose-Capillary: 422 mg/dL — ABNORMAL HIGH (ref 65–99)
Glucose-Capillary: 489 mg/dL — ABNORMAL HIGH (ref 65–99)

## 2016-02-03 LAB — MRSA PCR SCREENING: MRSA BY PCR: NEGATIVE

## 2016-02-03 LAB — POCT PREGNANCY, URINE: Preg Test, Ur: NEGATIVE

## 2016-02-03 LAB — MAGNESIUM: MAGNESIUM: 2 mg/dL (ref 1.7–2.4)

## 2016-02-03 MED ORDER — POTASSIUM CHLORIDE 10 MEQ/100ML IV SOLN
10.0000 meq | INTRAVENOUS | Status: DC
  Administered 2016-02-03: 10 meq via INTRAVENOUS
  Filled 2016-02-03 (×2): qty 100

## 2016-02-03 MED ORDER — ACETAMINOPHEN 650 MG RE SUPP: 650.0000 mg | Freq: Four times a day (QID) | RECTAL | Status: DC | PRN

## 2016-02-03 MED ORDER — SODIUM CHLORIDE 0.9 % IV BOLUS (SEPSIS): 1000.0000 mL | Freq: Once | INTRAVENOUS | Status: DC

## 2016-02-03 MED ORDER — ONDANSETRON HCL 4 MG PO TABS: 4.0000 mg | ORAL_TABLET | Freq: Four times a day (QID) | ORAL | Status: DC | PRN

## 2016-02-03 MED ORDER — ONDANSETRON HCL 4 MG/2ML IJ SOLN: 4.0000 mg | Freq: Four times a day (QID) | INTRAMUSCULAR | Status: DC | PRN

## 2016-02-03 MED ORDER — ACETAMINOPHEN 500 MG PO TABS
1000.0000 mg | ORAL_TABLET | Freq: Once | ORAL | Status: AC
  Administered 2016-02-03: 1000 mg via ORAL
  Filled 2016-02-03: qty 2

## 2016-02-03 MED ORDER — INSULIN REGULAR HUMAN 100 UNIT/ML IJ SOLN
INTRAMUSCULAR | Status: DC
  Administered 2016-02-03: 4.3 [IU]/h via INTRAVENOUS
  Filled 2016-02-03: qty 2.5

## 2016-02-03 MED ORDER — ONDANSETRON HCL 4 MG/2ML IJ SOLN
4.0000 mg | Freq: Once | INTRAMUSCULAR | Status: AC
Start: 2016-02-03 — End: 2016-02-03
  Administered 2016-02-03: 4 mg via INTRAVENOUS

## 2016-02-03 MED ORDER — DEXTROSE-NACL 5-0.45 % IV SOLN
INTRAVENOUS | Status: DC
  Administered 2016-02-03: 19:00:00 via INTRAVENOUS

## 2016-02-03 MED ORDER — ONDANSETRON HCL 4 MG/2ML IJ SOLN
INTRAMUSCULAR | Status: AC
  Administered 2016-02-03: 4 mg via INTRAVENOUS
  Filled 2016-02-03: qty 2

## 2016-02-03 MED ORDER — SODIUM CHLORIDE 0.9 % IV SOLN: INTRAVENOUS | Status: DC

## 2016-02-03 MED ORDER — ENOXAPARIN SODIUM 40 MG/0.4ML ~~LOC~~ SOLN
40.0000 mg | SUBCUTANEOUS | Status: DC
  Filled 2016-02-03: qty 0.4

## 2016-02-03 MED ORDER — SODIUM CHLORIDE 0.9 % IV SOLN
INTRAVENOUS | Status: DC
  Administered 2016-02-03: 4.7 [IU]/h via INTRAVENOUS
  Administered 2016-02-03: 7.2 [IU]/h via INTRAVENOUS
  Filled 2016-02-03: qty 2.5

## 2016-02-03 MED ORDER — SODIUM CHLORIDE 0.9 % IV BOLUS (SEPSIS)
1000.0000 mL | Freq: Once | INTRAVENOUS | Status: AC
  Administered 2016-02-03: 1000 mL via INTRAVENOUS

## 2016-02-03 MED ORDER — SODIUM CHLORIDE 0.9 % IV BOLUS (SEPSIS)
1000.0000 mL | Freq: Once | INTRAVENOUS | Status: AC
Start: 2016-02-03 — End: 2016-02-03
  Administered 2016-02-03: 1000 mL via INTRAVENOUS

## 2016-02-03 MED ORDER — POLYETHYLENE GLYCOL 3350 17 G PO PACK: 17.0000 g | PACK | Freq: Every day | ORAL | Status: DC | PRN

## 2016-02-03 MED ORDER — SODIUM CHLORIDE 0.9% FLUSH
3.0000 mL | Freq: Two times a day (BID) | INTRAVENOUS | Status: DC
  Administered 2016-02-03 (×2): 3 mL via INTRAVENOUS

## 2016-02-03 MED ORDER — ACETAMINOPHEN 325 MG PO TABS
650.0000 mg | ORAL_TABLET | Freq: Four times a day (QID) | ORAL | Status: DC | PRN
  Administered 2016-02-03: 650 mg via ORAL
  Filled 2016-02-03: qty 2

## 2016-02-03 NOTE — Progress Notes (Signed)
1730 admitted from ED. Noncompliant type 1 diabetic. Stable . On insulin drip.

## 2016-02-03 NOTE — ED Notes (Signed)
Pt to ed with c/o back pain, nausea and elevated blood sugar.  Pt with CBG of 452 at triage.  Pt skin warm and dry.

## 2016-02-03 NOTE — H&P (Signed)
[facility] NAME: Emma Thomas    MR#:  [id number]  DATE OF BIRTH:  [birth year]  DATE OF ADMISSION:  02/03/2016  PRIMARY CARE PHYSICIAN: [facility] [place]   REQUESTING/REFERRING PHYSICIAN: Dr. Mariea Clonts  CHIEF COMPLAINT:   Chief Complaint  Patient presents with  . Back Pain  . Hyperglycemia    HISTORY OF PRESENT ILLNESS:  Emma Thomas  is a 20 y.o. female with a known history of Insulin-dependent diabetes mellitus presents to the emergency room complaining of nausea, vomiting, abdominal pain. Patient has ran out of her Levemir since Friday. She normally takes 16 units twice a day and NovoLog 8 units 3 times a day with meals. Patient has not had any vomiting in the emergency room but continues to have abdominal pain and back pain. She has follow-up with endocrinology Dr. Lovett Calender on 02/06/2016. ABG shows pH of 7.96. Bicarbonate is 8. Blood sugars 545. She missed her Levemir as her pharmacy [facility] told her she needs prior authorization for her insulin.  PAST MEDICAL HISTORY:   Past Medical History  Diagnosis Date  . Diabetes mellitus without complication ([place])     PAST SURGICAL HISTORY:  History reviewed. No pertinent past surgical history.  SOCIAL HISTORY:   Social History  Substance Use Topics  . Smoking status: Never Smoker   . Smokeless tobacco: Never Used  . Alcohol Use: No    FAMILY HISTORY:   Family History  Problem Relation Age of Onset  . Breast cancer Mother   . Diabetes type II Brother     DRUG ALLERGIES:  No Known Allergies  REVIEW OF SYSTEMS:   Review of Systems  Constitutional: Positive for malaise/fatigue. Negative for fever, chills and weight loss.  HENT: Negative for hearing loss and nosebleeds.   Eyes: Negative for blurred vision, double vision and pain.  Respiratory: Negative for cough, hemoptysis, sputum production, shortness of breath and wheezing.   Cardiovascular:  Negative for chest pain, palpitations, orthopnea and leg swelling.  Gastrointestinal: Positive for nausea, vomiting and abdominal pain. Negative for diarrhea and constipation.  Genitourinary: Negative for dysuria and hematuria.  Musculoskeletal: Negative for myalgias, back pain and falls.  Skin: Negative for rash.  Neurological: Positive for dizziness and weakness. Negative for tremors, sensory change, speech change, focal weakness, seizures and headaches.  Endo/Heme/Allergies: Does not bruise/bleed easily.  Psychiatric/Behavioral: Negative for depression and memory loss. The patient is not nervous/anxious.     MEDICATIONS AT HOME:   Prior to Admission medications   Medication Sig Start Date End Date Taking? Authorizing Provider  insulin aspart (NOVOLOG FLEXPEN) 100 UNIT/ML FlexPen Inject 8 Units into the skin 3 (three) times daily with meals. 09/10/15  Yes Henreitta Leber, MD  Insulin Detemir (LEVEMIR FLEXPEN) 100 UNIT/ML Pen Inject 16 Units into the skin 2 (two) times daily. 09/10/15  Yes Henreitta Leber, MD  HYDROcodone-acetaminophen (NORCO/VICODIN) 5-325 MG tablet Take 1 tablet by mouth every 4 (four) hours as needed for moderate pain. Patient not taking: Reported on 02/03/2016 01/12/16   Harvest Dark, MD  oxyCODONE-acetaminophen (PERCOCET) 7.5-325 MG tablet Take 1 tablet by mouth every 4 (four) hours as needed for severe pain. Patient not taking: Reported on 02/03/2016 11/12/15 11/11/16  Sable Feil, PA-C  sulfamethoxazole-trimethoprim (BACTRIM DS,SEPTRA DS) 800-160 MG tablet Take 1 tablet by mouth 2 (two) times daily. Patient not taking: Reported on 02/03/2016 01/12/16   Harvest Dark, MD     VITAL SIGNS:  Last  menstrual period 01/11/2016.  PHYSICAL EXAMINATION:  Physical Exam  GENERAL:  20 y.o.-year-old patient lying in the bed with no acute distress.  EYES:  No scleral icterus. Extraocular muscles intact.  HEENT: Head atraumatic, normocephalic. Oropharynx and  nasopharynx clear. No oropharyngeal erythema, moist oral dry NECK:  Supple, no jugular venous distention. No thyroid enlargement, no tenderness.  LUNGS: Normal breath sounds bilaterally, no wheezing, rales, rhonchi. No use of accessory muscles of respiration.  CARDIOVASCULAR: S1, S2 normal. No murmurs, rubs, or gallops. Tachycardia ABDOMEN: Soft, nondistended. Bowel sounds present. No organomegaly or mass. Epigastric tenderness. No rigidity or guarding. EXTREMITIES: No pedal edema, cyanosis, or clubbing. + 2 pedal & radial pulses b/l.   NEUROLOGIC: Moves all 4 extremities equally. PSYCHIATRIC: The patient is alert and oriented x 3.   LABORATORY PANEL:   CBC  Recent Labs Lab 02/03/16 1129  WBC 12.0*  HGB 15.6  HCT 48.1*  PLT 416   ------------------------------------------------------------------------------------------------------------------  Chemistries   Recent Labs Lab 02/03/16 1129  NA 130*  K 4.4  CL 99*  CO2 8*  GLUCOSE 525*  BUN 16  CREATININE 1.06*  CALCIUM 9.5   ------------------------------------------------------------------------------------------------------------------  Cardiac Enzymes No results for input(s): TROPONINI in the last 168 hours. ------------------------------------------------------------------------------------------------------------------  RADIOLOGY:  No results found.   IMPRESSION AND PLAN:   * DKA with associated severe dehydration, pseudohyponatremia - due to missing her Levemir for 3 days. Heart aggressive IV fluid resuscitation with 3 L normal saline bolus. We'll continue normal saline at 200 ML per hour after that. Change fluids to D5 normal saline once blood sugars less than 250. Every 4 hours BMP. Potassium replacement. We will request case manager help for patient's Levemir. Patient has follow-up with endocrinology in 3 days.  Patient is critically ill with severe DKA with pH of 6.96. Will be admitted to ICU.   All  the records are reviewed and case discussed with ED provider. Management plans discussed with the patient, family and they are in agreement.  CODE STATUS: FULL CODE  TOTAL TIME TAKING CARE OF THIS PATIENT: 40 minutes.   Hillary Bow R M.D on 02/03/2016 at 2:35 PM  Between 7am to 6pm - Pager - [phone]  After 6pm go to [email or web address] - password [email or web address] [facility] [place] [facility]  Office  [phone]  CC: Primary care physician; [facility] [place]  Note: This dictation was prepared with Dragon dictation along with smaller phrase technology. Any transcriptional errors that result from this process are unintentional.

## 2016-02-03 NOTE — ED Provider Notes (Addendum)
[facility] Emergency Department Provider Note  ____________________________________________  Time seen: Approximately 1:24 PM  I have reviewed the triage vital signs and the nursing notes.   HISTORY  Chief Complaint Back Pain and Hyperglycemia    HPI Emma Thomas is a 20 y.o. female with a history of type 1 diabetes and multiple previous admissions for DKA presenting for "I think I have DKA." Patient states that she ran out of her insulin, last dose Thursday, and was unable to obtain more due to insurance issues. Over the past 2 days, she has developed polydipsia, low back pain, nausea and a single episode of vomiting in the emergency department.  Last bowel movement was several days ago and it is common for her to go several days without bowel movements. She denies any abdominal pain, cough or cold symptoms, fever or chills, urinary symptoms. LMP 2/24.   Past Medical History  Diagnosis Date  . Diabetes mellitus without complication [facility])     Patient Active Problem List   Diagnosis Date Noted  . DKA (diabetic ketoacidoses) ([place]) 09/09/2015    History reviewed. No pertinent past surgical history.  Current Outpatient Rx  Name  Route  Sig  Dispense  Refill  . HYDROcodone-acetaminophen (NORCO/VICODIN) 5-325 MG tablet   Oral   Take 1 tablet by mouth every 4 (four) hours as needed for moderate pain.   10 tablet   0   . insulin aspart (NOVOLOG FLEXPEN) 100 UNIT/ML FlexPen   Subcutaneous   Inject 8 Units into the skin 3 (three) times daily with meals.   15 mL   11   . Insulin Detemir (LEVEMIR FLEXPEN) 100 UNIT/ML Pen   Subcutaneous   Inject 16 Units into the skin 2 (two) times daily.   15 mL   11   . oxyCODONE-acetaminophen (PERCOCET) 7.5-325 MG tablet   Oral   Take 1 tablet by mouth every 4 (four) hours as needed for severe pain.   20 tablet   0   . sulfamethoxazole-trimethoprim (BACTRIM DS,SEPTRA DS) 800-160 MG tablet   Oral   Take 1  tablet by mouth 2 (two) times daily.   20 tablet   0     Allergies Review of patient's allergies indicates no known allergies.  Family History  Problem Relation Age of Onset  . Breast cancer Mother   . Diabetes type II Brother     Social History Social History  Substance Use Topics  . Smoking status: Never Smoker   . Smokeless tobacco: Never Used  . Alcohol Use: No    Review of Systems Constitutional: No fever/chills. No syncope or lightheadedness. Eyes: No visual changes. No eye discharge. ENT: No sore throat. No congestion or rhinorrhea. Cardiovascular: Denies chest pain, palpitations. Respiratory: Denies shortness of breath.  No cough. Gastrointestinal: No abdominal pain.  Positive nausea, positive vomiting.  No diarrhea.  Chronic constipation which is unchanged.  Genitourinary: Negative for dysuria. No hematuria. Musculoskeletal: Positive low back pain. Skin: Negative for rash. Neurological: Positive for mild headache. Negative for numbness tickling or weakness. No gait change.  10-point ROS otherwise negative.  ____________________________________________   PHYSICAL EXAM:  VITAL SIGNS: ED Triage Vitals  Enc Vitals Group     BP --      Pulse --      Resp --      Temp --      Temp src --      SpO2 --      Weight --  Height --      Head Cir --      Peak Flow --      Pain Score 02/03/16 1128 8     Pain Loc --      Pain Edu? --      Excl. in [place]? --     Constitutional: Patient is alert and oriented and answering questions appropriately. She appears clinically dehydrated, but nontoxic at this time.  Eyes: Conjunctivae are normal.  EOMI. no scleral icterus. No eye discharge. Head: Atraumatic. Nose: No congestion/rhinnorhea. Mouth/Throat: Mucous membranes are dry. Diffuse poor dentition but no evidence of acute abscess..  Neck: No stridor.  Supple.  No meningismus. Cardiovascular: Normal rate, regular rhythm. No murmurs, rubs or gallops.   Respiratory: Normal respiratory effort.  No retractions. Lungs CTAB.  No wheezes, rales or ronchi. Gastrointestinal: Abdomen is soft and nondistended. The patient has minimal tenderness to palpation in the left lower quadrant without palpable mass. No rebound or guarding. No peritoneal signs.  Musculoskeletal: No LE edema. Moves all extremities without pain. Neurologic:  Normal speech and language. No gross focal neurologic deficits are appreciated.  Skin:  Skin is warm, dry and intact. No rash noted. Psychiatric: Mood and affect are normal. Speech and behavior are normal.  Normal judgement.  ____________________________________________   LABS (all labs ordered are listed, but only abnormal results are displayed)  Labs Reviewed  GLUCOSE, CAPILLARY - Abnormal; Notable for the following:    Glucose-Capillary 452 (*)    All other components within normal limits  BASIC METABOLIC PANEL - Abnormal; Notable for the following:    Sodium 130 (*)    Chloride 99 (*)    CO2 8 (*)    Glucose, Bld 525 (*)    Creatinine, Ser 1.06 (*)    Anion gap 23 (*)    All other components within normal limits  CBC - Abnormal; Notable for the following:    WBC 12.0 (*)    HCT 48.1 (*)    All other components within normal limits  URINALYSIS COMPLETEWITH MICROSCOPIC ([facility] ONLY)  BLOOD GAS, VENOUS  CBG MONITORING, ED  POC URINE PREG, ED   ____________________________________________  EKG  ED ECG REPORT I, Eula Listen, the attending physician, personally viewed and interpreted this ECG.   Date: 02/03/2016  EKG Time: 1354  Rate: 110  Rhythm: sinus tachycardia  Axis: Normal  Intervals:Prolonged QtC  ST&T Change: No ST elevation.  ____________________________________________  RADIOLOGY  No results found.  ____________________________________________   PROCEDURES  Procedure(s) performed: None  Critical Care performed:  No ____________________________________________   INITIAL IMPRESSION / ASSESSMENT AND PLAN / ED COURSE  Pertinent labs & imaging results that were available during my care of the patient were reviewed by me and considered in my medical decision making (see chart for details).  20 y.o. female with a history of type I DM was not taken any insulin for 4 days presenting with thirst, nausea and vomiting. The patient's labs from triage due show hyperglycemia with an anion gap of 23; she is in DKA. The most likely etiology is the case noncompliance. She has some minimal left lower quadrant tenderness but also has not had a bowel movement in several days. At this time, acute infection such as diverticulitis or bowel obstruction is much less likely than medication noncompliance as a cause for her symptoms. We will also check her urine to rule out pregnancy and urinary tract infection. I will initiate a insulin drip and admit the patient  for further treatment.  ----------------------------------------- 1:57 PM on 02/03/2016 -----------------------------------------  The patient has been admitted to the hospital. Her gas shows a pH of 6.96.  ____________________________________________  FINAL CLINICAL IMPRESSION(S) / ED DIAGNOSES  Final diagnoses:  Diabetic ketoacidosis without coma associated with type 1 diabetes mellitus ([place])  Non-intractable vomiting with nausea, vomiting of unspecified type  Noncompliance with medications  Dehydration with hyponatremia      NEW MEDICATIONS STARTED DURING THIS VISIT:  New Prescriptions   No medications on file     Eula Listen, MD 02/03/16 1357  Eula Listen, MD 02/03/16 1408

## 2016-02-04 LAB — BASIC METABOLIC PANEL
ANION GAP: 4 — AB (ref 5–15)
ANION GAP: 6 (ref 5–15)
BUN: 12 mg/dL (ref 6–20)
BUN: 13 mg/dL (ref 6–20)
CALCIUM: 8.3 mg/dL — AB (ref 8.9–10.3)
CHLORIDE: 110 mmol/L (ref 101–111)
CO2: 17 mmol/L — ABNORMAL LOW (ref 22–32)
CO2: 16 mmol/L — AB (ref 22–32)
Calcium: 8.2 mg/dL — ABNORMAL LOW (ref 8.9–10.3)
Chloride: 112 mmol/L — ABNORMAL HIGH (ref 101–111)
Creatinine, Ser: 0.47 mg/dL (ref 0.44–1.00)
Creatinine, Ser: 0.67 mg/dL (ref 0.44–1.00)
GFR calc Af Amer: >60 mL/min (ref >60)
GLUCOSE: 176 mg/dL — AB (ref 65–99)
Glucose, Bld: 146 mg/dL — ABNORMAL HIGH (ref 65–99)
POTASSIUM: 3.3 mmol/L — AB (ref 3.5–5.1)
POTASSIUM: 3.9 mmol/L (ref 3.5–5.1)
SODIUM: 133 mmol/L — AB (ref 135–145)
Sodium: 132 mmol/L — ABNORMAL LOW (ref 135–145)

## 2016-02-04 LAB — GLUCOSE, CAPILLARY
GLUCOSE-CAPILLARY: 136 mg/dL — AB (ref 65–99)
GLUCOSE-CAPILLARY: 149 mg/dL — AB (ref 65–99)
GLUCOSE-CAPILLARY: 151 mg/dL — AB (ref 65–99)
GLUCOSE-CAPILLARY: 157 mg/dL — AB (ref 65–99)
GLUCOSE-CAPILLARY: 210 mg/dL — AB (ref 65–99)
Glucose-Capillary: 136 mg/dL — ABNORMAL HIGH (ref 65–99)
Glucose-Capillary: 246 mg/dL — ABNORMAL HIGH (ref 65–99)

## 2016-02-04 MED ORDER — INSULIN ASPART 100 UNIT/ML ~~LOC~~ SOLN: 0.0000 [IU] | Freq: Every day | SUBCUTANEOUS | Status: DC

## 2016-02-04 MED ORDER — INSULIN DETEMIR 100 UNIT/ML ~~LOC~~ SOLN
19.0000 [IU] | Freq: Once | SUBCUTANEOUS | Status: AC
  Administered 2016-02-04: 19 [IU] via SUBCUTANEOUS
  Filled 2016-02-04: qty 0.19

## 2016-02-04 MED ORDER — SODIUM CHLORIDE 0.9 % IV SOLN: 250.0000 mL | INTRAVENOUS | Status: DC | PRN

## 2016-02-04 MED ORDER — SODIUM CHLORIDE 0.9% FLUSH
3.0000 mL | Freq: Two times a day (BID) | INTRAVENOUS | Status: DC
  Administered 2016-02-04: 3 mL via INTRAVENOUS

## 2016-02-04 MED ORDER — SODIUM CHLORIDE 0.9% FLUSH: 3.0000 mL | INTRAVENOUS | Status: DC | PRN

## 2016-02-04 MED ORDER — OXYCODONE-ACETAMINOPHEN 5-325 MG PO TABS
1.0000 | ORAL_TABLET | Freq: Once | ORAL | Status: AC
  Administered 2016-02-04: 1 via ORAL

## 2016-02-04 MED ORDER — MEPERIDINE HCL 25 MG/ML IJ SOLN
25.0000 mg | Freq: Once | INTRAMUSCULAR | Status: AC
  Administered 2016-02-04: 25 mg via INTRAVENOUS
  Filled 2016-02-04: qty 1

## 2016-02-04 MED ORDER — OXYCODONE-ACETAMINOPHEN 5-325 MG PO TABS
ORAL_TABLET | ORAL | Status: AC
  Filled 2016-02-04: qty 1

## 2016-02-04 MED ORDER — POTASSIUM CHLORIDE CRYS ER 20 MEQ PO TBCR: 40.0000 meq | EXTENDED_RELEASE_TABLET | Freq: Once | ORAL | Status: DC

## 2016-02-04 MED ORDER — INSULIN ASPART 100 UNIT/ML ~~LOC~~ SOLN
0.0000 [IU] | Freq: Three times a day (TID) | SUBCUTANEOUS | Status: DC
  Administered 2016-02-04: 5 [IU] via SUBCUTANEOUS
  Filled 2016-02-04: qty 5

## 2016-02-04 MED ORDER — PROMETHAZINE HCL 25 MG/ML IJ SOLN
12.5000 mg | Freq: Once | INTRAMUSCULAR | Status: AC
  Administered 2016-02-04: 12.5 mg via INTRAVENOUS
  Filled 2016-02-04: qty 1

## 2016-02-04 MED ORDER — INSULIN LISPRO 100 UNIT/ML ~~LOC~~ SOLN: 5.0000 [IU] | Freq: Three times a day (TID) | SUBCUTANEOUS | Status: DC

## 2016-02-04 NOTE — Progress Notes (Signed)
Patient alert and oriented. Some complaints of headaches this am. Has subsided with medication. Up to bedside commode, ate breakfast. Patient has refused to take insulin for lunch. Explained the importance and she states that she will take at home when she eats. Does not want to stay at hospital for lunch. Did offer patient some crackers or sandwich but denied.Dr Posey Pronto sent prescriptions to patients pharmacy. Follow up with Endocrinology. Care Manager Moishe Spice has called grandfather to pick patient from hospital.

## 2016-02-04 NOTE — Progress Notes (Signed)
Pharmacy ICU Daily Progress Note  TH is a 20yo female admitted 02/03/16 for DKA 2/2 non-compliant type 1 diabetes.  PTA insulin regimen: levemir 16 units BID, novolog 8 units TID AC.   Active pharmacy consults: none  Infectious:  Antimicrobials: none WBC Afebrile Culture Results:  Electrolytes:  Potassium: 3.3 Magnesium: 2.0 on 3/20 Phosphorus: none Supplementation plans: KCl 89mEq PO x1  Pulmonary: Bronchodilators? none Ventilator status: no O2 sat/FiO2: 100 on RA  Current steroids: none Taper plans:   GI:  Constipation PPx: PEG prn (pt has not had BM in several days, which is not uncommon for her, but she does have lower abdominal tenderness) Feeding status: carb-modified, thin liquids LBM: per patient, several days SUP: none  Insulin:  SSI use in 24hrs:  Current Insulin orders: insulin gtt @ 3.6 units/hr, SSI TID AC + QHS Last 3 CBGs: 146, 197, 525  DVT PPx: lovenox 40mg  SQ q24hrs  Sedation+Pain:  RASS goal? 0 GCS 15 Last pain score:  Opioid use in last 24 hrs:   Pressors: none MAP goal:   Medication education/counseling required? No

## 2016-02-04 NOTE — Discharge Instructions (Signed)
Take your insulin and keep log of your sugars

## 2016-02-04 NOTE — Discharge Summary (Signed)
[place] [facility] [place] NAME: Emma Thomas    MR#:  [id number]  DATE OF BIRTH:  November 14, 1996  DATE OF ADMISSION:  02/03/2016 ADMITTING PHYSICIAN: Hillary Bow, MD  DATE OF DISCHARGE: 02/04/2016  PRIMARY CARE PHYSICIAN: [facility]    ADMISSION DIAGNOSIS:  Dehydration with hyponatremia [E87.1] Noncompliance with medications [Z91.14] Diabetic ketoacidosis without coma associated with type 1 diabetes mellitus ([place]) [E10.10] Non-intractable vomiting with nausea, vomiting of unspecified type [R11.2]  DISCHARGE DIAGNOSIS:  DKA without coma associated with type 1 diabetes Type 1 diabetes  SECONDARY DIAGNOSIS:   Past Medical History  Diagnosis Date  . Diabetes mellitus without complication [facility])     HOSPITAL COURSE:  Emma Thomas is a 20 y.o. female with a known history of Insulin-dependent diabetes mellitus presents to the emergency room complaining of nausea, vomiting, abdominal pain. Patient has ran out of her Levemir since Friday. She normally takes 16 units twice a day and NovoLog 8 units 3 times a day with meals.   * DKA with associated severe dehydration, pseudohyponatremia - due to missing her Levemir for 3 days. -Patient received aggressive IV fluid resuscitation with 3 L normal saline bolus. We'll continue normal saline at 200 ML per hour after that. -Patient's anion gap has closed  -Patient will be able to take Levemir 16 units twice a day and Humalog 5 units 3 times a day. Manager has verified with her insurance for coverage of this insulin regimen. -She will follow E endocrinology on her scheduled appointment 02/07/2016 Patient has follow-up with endocrinology in 3 days.  *Headache We'll give one dose of Demerol and Phenergan Patient has had Percocet earlier how ever did not help. She does have history of migraine headaches in the remote past. Patient is asked to follow with primary care physician for  further evaluation.  She is being discharged later this afternoon to home  CONSULTS OBTAINED:     DRUG ALLERGIES:  No Known Allergies  DISCHARGE MEDICATIONS:   Current Discharge Medication List    START taking these medications   Details  insulin lispro (HUMALOG) 100 UNIT/ML injection Inject 0.05 mLs (5 Units total) into the skin 3 (three) times daily before meals. Qty: 10 mL, Refills: 11      CONTINUE these medications which have NOT CHANGED   Details  Insulin Detemir (LEVEMIR FLEXPEN) 100 UNIT/ML Pen Inject 16 Units into the skin 2 (two) times daily. Qty: 15 mL, Refills: 11    HYDROcodone-acetaminophen (NORCO/VICODIN) 5-325 MG tablet Take 1 tablet by mouth every 4 (four) hours as needed for moderate pain. Qty: 10 tablet, Refills: 0      STOP taking these medications     insulin aspart (NOVOLOG FLEXPEN) 100 UNIT/ML FlexPen      oxyCODONE-acetaminophen (PERCOCET) 7.5-325 MG tablet      sulfamethoxazole-trimethoprim (BACTRIM DS,SEPTRA DS) 800-160 MG tablet         If you experience worsening of your admission symptoms, develop shortness of breath, life threatening emergency, suicidal or homicidal thoughts you must seek medical attention immediately by calling 911 or calling your MD immediately  if symptoms less severe.  You Must read complete instructions/literature along with all the possible adverse reactions/side effects for all the Medicines you take and that have been prescribed to you. Take any new Medicines after you have completely understood and accept all the possible adverse reactions/side effects.   Please note  You were cared for by a hospitalist during your  hospital stay. If you have any questions about your discharge medications or the care you received while you were in the hospital after you are discharged, you can call the unit and asked to speak with the hospitalist on call if the hospitalist that took care of you is not available. Once you are  discharged, your primary care physician will handle any further medical issues. Please note that NO REFILLS for any discharge medications will be authorized once you are discharged, as it is imperative that you return to your primary care physician (or establish a relationship with a primary care physician if you do not have one) for your aftercare needs so that they can reassess your need for medications and monitor your lab values. Today   SUBJECTIVE   Headache  VITAL SIGNS:  Blood pressure 108/72, pulse 98, temperature 98.6 F (37 C), temperature source Oral, resp. rate 18, weight 67.2 kg (148 lb 2.4 oz), last menstrual period 01/11/2016, SpO2 100 %.  I/O:   Intake/Output Summary (Last 24 hours) at 02/04/16 1140 Last data filed at 02/04/16 0400  Gross per 24 hour  Intake   1345 ml  Output    250 ml  Net   1095 ml    PHYSICAL EXAMINATION:  GENERAL:  20 y.o.-year-old patient lying in the bed with no acute distress.  EYES: Pupils equal, round, reactive to light and accommodation. No scleral icterus. Extraocular muscles intact.  HEENT: Head atraumatic, normocephalic. Oropharynx and nasopharynx clear.  NECK:  Supple, no jugular venous distention. No thyroid enlargement, no tenderness.  LUNGS: Normal breath sounds bilaterally, no wheezing, rales,rhonchi or crepitation. No use of accessory muscles of respiration.  CARDIOVASCULAR: S1, S2 normal. No murmurs, rubs, or gallops.  ABDOMEN: Soft, non-tender, non-distended. Bowel sounds present. No organomegaly or mass.  EXTREMITIES: No pedal edema, cyanosis, or clubbing.  NEUROLOGIC: Cranial nerves II through XII are intact. Muscle strength 5/5 in all extremities. Sensation intact. Gait not checked.  PSYCHIATRIC: The patient is alert and oriented x 3.  SKIN: No obvious rash, lesion, or ulcer.   DATA REVIEW:   CBC   Recent Labs Lab 02/03/16 1129  WBC 12.0*  HGB 15.6  HCT 48.1*  PLT 416    Chemistries   Recent Labs Lab  02/03/16 1933  02/04/16 0409  NA  --   < > 133*  K  --   < > 3.3*  CL  --   < > 112*  CO2  --   < > 17*  GLUCOSE  --   < > 146*  BUN  --   < > 12  CREATININE  --   < > 0.47  CALCIUM  --   < > 8.3*  MG 2.0  --   --   < > = values in this interval not displayed.  Microbiology Results   Recent Results (from the past 240 hour(s))  MRSA PCR Screening     Status: None   Collection Time: 02/03/16  8:03 PM  Result Value Ref Range Status   MRSA by PCR NEGATIVE NEGATIVE Final    Comment:        The GeneXpert MRSA Assay (FDA approved for NASAL specimens only), is one component of a comprehensive MRSA colonization surveillance program. It is not intended to diagnose MRSA infection nor to guide or monitor treatment for MRSA infections.     RADIOLOGY:  No results found.   Management plans discussed with the patient, family and they are in agreement.  CODE STATUS:     Code Status Orders        Start     Ordered   02/03/16 1431  Full code   Continuous     02/03/16 1431    Code Status History    Date Active Date Inactive Code Status Order ID Comments User Context   09/09/2015 11:02 AM 09/10/2015  3:32 PM Full Code [id number]  Henreitta Leber, MD Inpatient      TOTAL TIME TAKING CARE OF THIS PATIENT: 40* minutes.    Shiheem Corporan M.D on 02/04/2016 at 11:40 AM  Between 7am to 6pm - Pager - [phone] After 6pm go to [email or web address] - password [email or web address] [facility]  [place] [facility]  Office  [phone]  CC: Primary care physician; [place]

## 2016-02-04 NOTE — Progress Notes (Addendum)
Chaplain rounded the unit and provided a compassionate presence and support with prayer to patient who appeared to be sleeping. Chaplain Kasumi Ditullio [phone] 

## 2016-02-04 NOTE — Progress Notes (Signed)
Inpatient Diabetes Program Recommendations  AACE/ADA: New Consensus Statement on Inpatient Glycemic Control (2015)  Target Ranges:  Prepandial:   less than 140 mg/dL      Peak postprandial:   less than 180 mg/dL (1-2 hours)      Critically ill patients:  140 - 180 mg/dL  Results for Emma Thomas, Emma Thomas (MRN [id number]) as of 02/04/2016 10:07  Ref. Range 02/03/2016 23:36 02/04/2016 00:38 02/04/2016 01:39 02/04/2016 02:52 02/04/2016 04:11 02/04/2016 05:13 02/04/2016 08:23  Glucose-Capillary Latest Ref Range: 65-99 mg/dL 164 (H) 151 (H) 136 (H) 149 (H) 136 (H) 157 (H) 246 (H)  Results for Emma Thomas, Emma Thomas (MRN [id number]) as of 02/04/2016 10:07  Ref. Range 02/03/2016 11:29  Glucose Latest Ref Range: 65-99 mg/dL 525 (HH)   Review of Glycemic Control  Diabetes history: DM1 Outpatient Diabetes medications: Levemir 16 units BID, Novolog 8 units TID with meals Current orders for Inpatient glycemic control: Novolog 0-15 units TID with meals, Novolog 0-5 units QHS  Inpatient Diabetes Program Recommendations: Insulin - Basal: Patient admitted with DKA and was placed on insulin drip and has been transitioned off. Patient received Levemir 19 units at 3:15 am today and glucose 246 mg/dl at 8:23 am today. Please consider ordering Levemir 15 units BID.  Correction (SSI): Patient has Type 1 diabetes and is very sensitive to insulin. Please consider decreasing Novolog correction to sensitive correction scale ACHS. Insulin - Meal Coverage: Since patient has Type 1 diabetes she will require meal coverage for carbohydrates consumed. Please consider ordering Novolog 5 units TID with meals for meal coverage if patient eats at least 50% of meal. HgbA1C: Please order an A1C to evaluate glycemic control over the past 2-3 months. Outpatient Referral: Called Endocrinology office and verified patient does have an appointment with Dr. Graceann Congress on 02/07/16 @ 9:30 am.  Thanks, Barnie Alderman, RN, MSN, CDE Diabetes Coordinator Inpatient  Diabetes Program [phone] (Team Pager from [place] to [place]) [phone] ([place] office) [phone] [facility] office) [phone] [facility] office)

## 2016-02-04 NOTE — Progress Notes (Signed)
Patient ID: Emma Thomas, female   DOB: 01-25-1996, 20 y.o.   MRN: [id number]                                          [name] was admitted to the Hospital on 02/03/2016 and Discharged  02/04/2016 and should be excused from work/school   for  2 days starting 02/03/2016 , may return to work/school without any restrictions.  Call Fritzi Mandes MD, [facility]  [phone] with questions.  Vonnie Ligman M.D on 02/04/2016,at 12:33 PM

## 2016-02-04 NOTE — Progress Notes (Signed)
Patient walked out of unit after refusing wheelchair and refusing to have her grandfather come pick her up.

## 2016-02-04 NOTE — Care Management (Addendum)
Patient admitted with DKA and it is reported to CM that patient is not able to obtain her insulin.  Spoke with patient and informed that she can pay for her insulin but has not been able to obtain her novolog because it is requiring prior authorization. Confirmed with [facility] in [place] that there is a pending authorization for the Novolog.  Per insurance company Humalog will not require prior authorization.  Informed patient and attending.  Dr patel will write for humalog.  Updated primary nurse.  Patient's primary PCP is Dr Hoy Morn with [facility].  She has an appt with endocrinology on 3/24

## 2016-02-04 NOTE — Care Management (Signed)
Patient informed nurse that she did not have transportation home and no one to call.  CM called contact on face sheet- patient's grandfather and he will transport patient home.  Informed patient

## 2016-06-08 ENCOUNTER — Inpatient Hospital Stay
Admission: EM | Admit: 2016-06-08 | Discharge: 2016-06-11 | DRG: 871 | Disposition: A | Discharge status: Home / Self Care | Payer: 59 | Attending: Internal Medicine | Admitting: Internal Medicine

## 2016-06-08 ENCOUNTER — Encounter: Payer: Self-pay | Admitting: Emergency Medicine

## 2016-06-08 DIAGNOSIS — Z803 Family history of malignant neoplasm of breast: Secondary | ICD-10-CM

## 2016-06-08 DIAGNOSIS — E1010 Type 1 diabetes mellitus with ketoacidosis without coma: Secondary | ICD-10-CM | POA: Diagnosis present

## 2016-06-08 DIAGNOSIS — Z9119 Patient's noncompliance with other medical treatment and regimen: Secondary | ICD-10-CM

## 2016-06-08 DIAGNOSIS — I10 Essential (primary) hypertension: Secondary | ICD-10-CM | POA: Diagnosis present

## 2016-06-08 DIAGNOSIS — A419 Sepsis, unspecified organism: Secondary | ICD-10-CM | POA: Diagnosis present

## 2016-06-08 DIAGNOSIS — Z9114 Patient's other noncompliance with medication regimen: Secondary | ICD-10-CM | POA: Diagnosis not present

## 2016-06-08 DIAGNOSIS — N10 Acute tubulo-interstitial nephritis: Secondary | ICD-10-CM | POA: Diagnosis present

## 2016-06-08 DIAGNOSIS — N12 Tubulo-interstitial nephritis, not specified as acute or chronic: Secondary | ICD-10-CM | POA: Diagnosis present

## 2016-06-08 DIAGNOSIS — E876 Hypokalemia: Secondary | ICD-10-CM | POA: Diagnosis present

## 2016-06-08 DIAGNOSIS — Z833 Family history of diabetes mellitus: Secondary | ICD-10-CM | POA: Diagnosis not present

## 2016-06-08 DIAGNOSIS — Z794 Long term (current) use of insulin: Secondary | ICD-10-CM | POA: Diagnosis not present

## 2016-06-08 DIAGNOSIS — R000 Tachycardia, unspecified: Secondary | ICD-10-CM | POA: Diagnosis present

## 2016-06-08 LAB — BASIC METABOLIC PANEL
Anion gap: 9 (ref 5–15)
BUN: 10 mg/dL (ref 6–20)
CHLORIDE: 109 mmol/L (ref 101–111)
CO2: 16 mmol/L — ABNORMAL LOW (ref 22–32)
CREATININE: 0.69 mg/dL (ref 0.44–1.00)
Calcium: 7.7 mg/dL — ABNORMAL LOW (ref 8.9–10.3)
GFR calc Af Amer: >60 mL/min (ref >60)
GFR calc non Af Amer: >60 mL/min (ref >60)
GLUCOSE: 176 mg/dL — AB (ref 65–99)
Potassium: 3.8 mmol/L (ref 3.5–5.1)
SODIUM: 134 mmol/L — AB (ref 135–145)

## 2016-06-08 LAB — URINALYSIS COMPLETE WITH MICROSCOPIC (ARMC ONLY)
Bilirubin Urine: NEGATIVE
Glucose, UA: >500 mg/dL — AB
NITRITE: NEGATIVE
PROTEIN: 100 mg/dL — AB
SPECIFIC GRAVITY, URINE: 1.02 (ref 1.005–1.030)
pH: 5 (ref 5.0–8.0)

## 2016-06-08 LAB — CBC WITH DIFFERENTIAL/PLATELET
Basophils Absolute: 0 10*3/uL (ref 0–0.1)
Basophils Relative: 0 %
EOS ABS: 0 10*3/uL (ref 0–0.7)
EOS PCT: 0 %
HCT: 39.3 % (ref 35.0–47.0)
HEMOGLOBIN: 13.3 g/dL (ref 12.0–16.0)
LYMPHS ABS: 1.5 10*3/uL (ref 1.0–3.6)
LYMPHS PCT: 10 %
MCH: 31.3 pg (ref 26.0–34.0)
MCHC: 33.9 g/dL (ref 32.0–36.0)
MCV: 92.2 fL (ref 80.0–100.0)
MONOS PCT: 11 %
Monocytes Absolute: 1.7 10*3/uL — ABNORMAL HIGH (ref 0.2–0.9)
NEUTROS PCT: 79 %
Neutro Abs: 12.5 10*3/uL — ABNORMAL HIGH (ref 1.4–6.5)
Platelets: 318 10*3/uL (ref 150–440)
RBC: 4.26 MIL/uL (ref 3.80–5.20)
RDW: 13.8 % (ref 11.5–14.5)
WBC: 15.7 10*3/uL — ABNORMAL HIGH (ref 3.6–11.0)

## 2016-06-08 LAB — COMPREHENSIVE METABOLIC PANEL
ALT: 11 U/L — AB (ref 14–54)
AST: 20 U/L (ref 15–41)
Albumin: 3.4 g/dL — ABNORMAL LOW (ref 3.5–5.0)
Alkaline Phosphatase: 146 U/L — ABNORMAL HIGH (ref 38–126)
Anion gap: 16 — ABNORMAL HIGH (ref 5–15)
BUN: 13 mg/dL (ref 6–20)
CHLORIDE: 101 mmol/L (ref 101–111)
CO2: 17 mmol/L — AB (ref 22–32)
CREATININE: 0.84 mg/dL (ref 0.44–1.00)
Calcium: 8.6 mg/dL — ABNORMAL LOW (ref 8.9–10.3)
GFR calc Af Amer: >60 mL/min (ref >60)
GFR calc non Af Amer: >60 mL/min (ref >60)
Glucose, Bld: 271 mg/dL — ABNORMAL HIGH (ref 65–99)
POTASSIUM: 4.1 mmol/L (ref 3.5–5.1)
SODIUM: 134 mmol/L — AB (ref 135–145)
Total Bilirubin: 1.9 mg/dL — ABNORMAL HIGH (ref 0.3–1.2)
Total Protein: 8.1 g/dL (ref 6.5–8.1)

## 2016-06-08 LAB — GLUCOSE, CAPILLARY
GLUCOSE-CAPILLARY: 250 mg/dL — AB (ref 65–99)
GLUCOSE-CAPILLARY: 303 mg/dL — AB (ref 65–99)
GLUCOSE-CAPILLARY: 253 mg/dL — AB (ref 65–99)

## 2016-06-08 LAB — LACTIC ACID, PLASMA: LACTIC ACID, VENOUS: 2.2 mmol/L — AB (ref 0.5–1.9)

## 2016-06-08 MED ORDER — SODIUM CHLORIDE 0.9 % IV BOLUS (SEPSIS)
1000.0000 mL | Freq: Once | INTRAVENOUS | Status: AC
  Administered 2016-06-08: 1000 mL via INTRAVENOUS

## 2016-06-08 MED ORDER — ACETAMINOPHEN 325 MG PO TABS
ORAL_TABLET | ORAL | Status: AC
  Administered 2016-06-08: 650 mg
  Filled 2016-06-08: qty 2

## 2016-06-08 MED ORDER — INSULIN DETEMIR 100 UNIT/ML ~~LOC~~ SOLN
16.0000 [IU] | Freq: Two times a day (BID) | SUBCUTANEOUS | Status: DC
  Administered 2016-06-08 – 2016-06-10 (×5): 16 [IU] via SUBCUTANEOUS
  Filled 2016-06-08 (×7): qty 0.16

## 2016-06-08 MED ORDER — MORPHINE SULFATE (PF) 4 MG/ML IV SOLN
4.0000 mg | Freq: Once | INTRAVENOUS | Status: AC
  Administered 2016-06-08: 4 mg via INTRAVENOUS
  Filled 2016-06-08: qty 1

## 2016-06-08 MED ORDER — ONDANSETRON HCL 4 MG/2ML IJ SOLN: 4.0000 mg | Freq: Four times a day (QID) | INTRAMUSCULAR | Status: DC | PRN

## 2016-06-08 MED ORDER — INSULIN ASPART 100 UNIT/ML ~~LOC~~ SOLN
0.0000 [IU] | Freq: Every day | SUBCUTANEOUS | Status: DC
  Administered 2016-06-08: 4 [IU] via SUBCUTANEOUS
  Administered 2016-06-09: 2 [IU] via SUBCUTANEOUS
  Filled 2016-06-08: qty 2
  Filled 2016-06-08: qty 4

## 2016-06-08 MED ORDER — ONDANSETRON HCL 4 MG PO TABS: 4.0000 mg | ORAL_TABLET | Freq: Four times a day (QID) | ORAL | Status: DC | PRN

## 2016-06-08 MED ORDER — INSULIN DETEMIR 100 UNIT/ML FLEXPEN: 16.0000 [IU] | PEN_INJECTOR | Freq: Two times a day (BID) | SUBCUTANEOUS | Status: DC

## 2016-06-08 MED ORDER — ACETAMINOPHEN 325 MG PO TABS
650.0000 mg | ORAL_TABLET | Freq: Four times a day (QID) | ORAL | Status: DC | PRN
  Administered 2016-06-08 – 2016-06-09 (×2): 650 mg via ORAL
  Filled 2016-06-08 (×2): qty 2

## 2016-06-08 MED ORDER — ONDANSETRON HCL 4 MG/2ML IJ SOLN
4.0000 mg | Freq: Once | INTRAMUSCULAR | Status: AC
  Administered 2016-06-08: 4 mg via INTRAVENOUS

## 2016-06-08 MED ORDER — DEXTROSE 5 % IV SOLN
2.0000 g | Freq: Once | INTRAVENOUS | Status: AC
  Administered 2016-06-08: 2 g via INTRAVENOUS
  Filled 2016-06-08: qty 2

## 2016-06-08 MED ORDER — HYDROCODONE-ACETAMINOPHEN 5-325 MG PO TABS
1.0000 | ORAL_TABLET | ORAL | Status: DC | PRN
  Administered 2016-06-09: 1 via ORAL
  Administered 2016-06-10 – 2016-06-11 (×5): 2 via ORAL
  Filled 2016-06-08 (×4): qty 2
  Filled 2016-06-08: qty 1
  Filled 2016-06-08: qty 2

## 2016-06-08 MED ORDER — ENOXAPARIN SODIUM 40 MG/0.4ML ~~LOC~~ SOLN
40.0000 mg | SUBCUTANEOUS | Status: DC
  Filled 2016-06-08: qty 0.4

## 2016-06-08 MED ORDER — INSULIN ASPART 100 UNIT/ML ~~LOC~~ SOLN
0.0000 [IU] | Freq: Three times a day (TID) | SUBCUTANEOUS | Status: DC
  Administered 2016-06-09: 5 [IU] via SUBCUTANEOUS
  Administered 2016-06-09: 1 [IU] via SUBCUTANEOUS
  Administered 2016-06-10 (×2): 2 [IU] via SUBCUTANEOUS
  Filled 2016-06-08: qty 1
  Filled 2016-06-08: qty 2
  Filled 2016-06-08: qty 3
  Filled 2016-06-08: qty 2

## 2016-06-08 MED ORDER — SODIUM CHLORIDE 0.9% FLUSH
3.0000 mL | Freq: Two times a day (BID) | INTRAVENOUS | Status: DC
  Administered 2016-06-08: 3 mL via INTRAVENOUS

## 2016-06-08 MED ORDER — ONDANSETRON HCL 4 MG/2ML IJ SOLN
INTRAMUSCULAR | Status: AC
  Administered 2016-06-08: 4 mg via INTRAVENOUS
  Filled 2016-06-08: qty 2

## 2016-06-08 MED ORDER — INSULIN ASPART 100 UNIT/ML ~~LOC~~ SOLN
5.0000 [IU] | Freq: Three times a day (TID) | SUBCUTANEOUS | Status: DC
  Administered 2016-06-09 – 2016-06-10 (×5): 5 [IU] via SUBCUTANEOUS
  Filled 2016-06-08 (×5): qty 5

## 2016-06-08 MED ORDER — INSULIN ASPART 100 UNIT/ML ~~LOC~~ SOLN
10.0000 [IU] | Freq: Once | SUBCUTANEOUS | Status: AC
  Administered 2016-06-08: 10 [IU] via SUBCUTANEOUS
  Filled 2016-06-08: qty 10

## 2016-06-08 MED ORDER — SODIUM CHLORIDE 0.9 % IV SOLN
INTRAVENOUS | Status: DC
  Administered 2016-06-08 – 2016-06-11 (×7): via INTRAVENOUS

## 2016-06-08 MED ORDER — ACETAMINOPHEN 650 MG RE SUPP: 650.0000 mg | Freq: Four times a day (QID) | RECTAL | Status: DC | PRN

## 2016-06-08 MED ORDER — DEXTROSE 5 % IV SOLN
1.0000 g | INTRAVENOUS | Status: DC
  Administered 2016-06-09 – 2016-06-10 (×2): 1 g via INTRAVENOUS
  Filled 2016-06-08 (×2): qty 10

## 2016-06-08 NOTE — ED Triage Notes (Signed)
Patient presents to the ED with hyperglycemia.  Patient states, "I feel terrible, I've been sleeping all day, my back hurts and my head hurts."  Patient has a fever in triage and is shivering.  Patient states she is a type 1 diabetic but does not always check her sugar regularly.

## 2016-06-08 NOTE — H&P (Signed)
[facility] [place] NAME: Emma Thomas    MR#:  [id number]  DATE OF BIRTH:  [birth year]  DATE OF ADMISSION:  06/08/2016  PRIMARY CARE PHYSICIAN: [facility]   REQUESTING/REFERRING PHYSICIAN: Dr. Larae Grooms  CHIEF COMPLAINT:   Chief Complaint  Patient presents with  . Hyperglycemia  . Weakness    HISTORY OF PRESENT ILLNESS:  Emma Thomas  is a 20 y.o. female with a known history of Type 1 diabetes mellitus noncompliant with her insulin regimen presents to emergency room secondary to fever and left flank pain. 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 She forgets to take her insulin at times. Her last admission for DKA was in March 2017. She says her symptoms started about a week ago with chills and had some abdominal pain which resolved immediately. Yesterday she started having left flank pain associated with chills, weakness, increased thirst and increased frequency of urination. She is sexually active, denies any pain with sexual intercourse. She couldn't stay awake today and so presented to the emergency room. She has a temperature of 10 82F, increased white count, tachycardia and possible pyelonephritis. She is being admitted for sepsis.  PAST MEDICAL HISTORY:   Past Medical History:  Diagnosis Date  . Diabetes mellitus without complication (HCC)    Type 1 DM  . Hypertension     PAST SURGICAL HISTORY:   Past Surgical History:  Procedure Laterality Date  . abscess removal      SOCIAL HISTORY:   Social History  Substance Use Topics  . Smoking status: Never Smoker  . Smokeless tobacco: Never Used  . Alcohol use No    FAMILY HISTORY:   Family History  Problem Relation Age of Onset  . Breast cancer Mother   . Diabetes type II Brother     DRUG ALLERGIES:  No Known Allergies  REVIEW OF SYSTEMS:   Review of Systems  Constitutional: Positive  for fever and malaise/fatigue. Negative for chills and weight loss.  HENT: Negative for ear discharge, ear pain, hearing loss and nosebleeds.   Eyes: Negative for blurred vision, double vision and photophobia.  Respiratory: Negative for cough, hemoptysis, shortness of breath and wheezing.   Cardiovascular: Negative for chest pain, palpitations, orthopnea and leg swelling.  Gastrointestinal: Positive for abdominal pain and nausea. Negative for constipation, diarrhea, heartburn, melena and vomiting.  Genitourinary: Positive for frequency. Negative for dysuria and urgency.  Musculoskeletal: Positive for back pain. Negative for myalgias and neck pain.  Skin: Negative for rash.  Neurological: Negative for dizziness, tingling, tremors, sensory change, speech change, focal weakness and headaches.  Endo/Heme/Allergies: Does not bruise/bleed easily.  Psychiatric/Behavioral: Negative for depression.    MEDICATIONS AT HOME:   Prior to Admission medications   Medication Sig Start Date End Date Taking? Authorizing Provider  Insulin Detemir (LEVEMIR FLEXPEN) 100 UNIT/ML Pen Inject 16 Units into the skin 2 (two) times daily. 09/10/15  Yes Henreitta Leber, MD  insulin lispro (HUMALOG) 100 UNIT/ML injection Inject 0.05 mLs (5 Units total) into the skin 3 (three) times daily before meals. 02/04/16  Yes Fritzi Mandes, MD      VITAL SIGNS:  Blood pressure (!) 149/95, pulse (!) 125, temperature (!) 103.2 F (39.6 C), temperature source Oral, resp. rate (!) 22, height 5\' 6"  (1.676 m), weight 65.8 kg (145 lb), SpO2 100 %.  PHYSICAL EXAMINATION:   Physical Exam  GENERAL:  20 y.o.-year-old patient sitting in the bed with no acute distress.  EYES: Pupils equal, round, reactive to light and accommodation. No scleral icterus. Extraocular muscles intact.  HEENT: Head atraumatic, normocephalic. Oropharynx and nasopharynx clear.  NECK:  Supple, no jugular venous distention. No thyroid enlargement, no tenderness.   LUNGS: Normal breath sounds bilaterally, no wheezing, rales,rhonchi or crepitation. No use of accessory muscles of respiration. Decreased bibasilar breath sounds. CARDIOVASCULAR: S1, S2 normal. No murmurs, rubs, or gallops.  ABDOMEN: soft but tender left flank region, CVA tenderness present. No guarding or rigidity,, nondistended. Bowel sounds present. No organomegaly or mass.  EXTREMITIES: No pedal edema, cyanosis, or clubbing.  NEUROLOGIC: Cranial nerves II through XII are intact. Muscle strength 5/5 in all extremities. Sensation intact. Gait not checked.  PSYCHIATRIC: The patient is alert and oriented x 3.  SKIN: No obvious rash, lesion, or ulcer.   LABORATORY PANEL:   CBC  Recent Labs Lab 06/08/16 1857  WBC 15.7*  HGB 13.3  HCT 39.3  PLT 318   ------------------------------------------------------------------------------------------------------------------  Chemistries   Recent Labs Lab 06/08/16 1857  NA 134*  K 4.1  CL 101  CO2 17*  GLUCOSE 271*  BUN 13  CREATININE 0.84  CALCIUM 8.6*  AST 20  ALT 11*  ALKPHOS 146*  BILITOT 1.9*   ------------------------------------------------------------------------------------------------------------------  Cardiac Enzymes No results for input(s): TROPONINI in the last 168 hours. ------------------------------------------------------------------------------------------------------------------  RADIOLOGY:  No results found.  EKG:   Orders placed or performed during the hospital encounter of 02/03/16  . ED EKG  . ED EKG  . EKG 12-Lead  . EKG 12-Lead    IMPRESSION AND PLAN:   Emma Thomas  is a 20 y.o. female with a known history of Type 1 diabetes mellitus noncompliant with her insulin regimen presents to emergency room secondary to fever and left flank pain.  #1 sepsis-secondary to UTI and acute pyelonephritis. -Blood cultures and urine cultures have been sent for. -IV fluid resuscitation. Recheck lactic  acid. -Started on Rocephin.  #2 type 1 diabetes mellitus-uncontrolled diabetes mellitus due to noncompliance. -Education given. Restart on Levemir twice a day and pre-meal insulin. Also sliding scale insulin. -Check A1c. On carb controlled diet. -Anion gap on the borderline high side. IV fluid resuscitation and recheck another basic metabolic panel tonight.  #3 leukocytosis-secondary to her infection. Monitor.  #4 DVT prophylaxis-on Lovenox    All the records are reviewed and case discussed with ED provider. Management plans discussed with the patient, family and they are in agreement.  CODE STATUS: Full Code  TOTAL TIME TAKING CARE OF THIS PATIENT: 50  minutes.    Gladstone Lighter M.D on 06/08/2016 at 8:06 PM  Between 7am to 6pm - Pager - [phone]  After 6pm go to [email or web address] - password [email or web address] [facility]  [facility]  Office  [phone]  CC: Primary care physician; Trainer

## 2016-06-08 NOTE — Progress Notes (Signed)
Pharmacy Antibiotic Note  Emma Thomas is a 20 y.o. female admitted on 06/08/2016 with UTI.  Pharmacy has been consulted for ceftriaxone dosing.  Plan:  Pt received ceftriaxone 2g IV for one does in the ED. Patient will be started on ceftriaxone 1g IV q24 tomorrow evening.   Height: 5\' 6"  (167.6 cm) Weight: 145 lb (65.8 kg) IBW/kg (Calculated) : 59.3  Temp (24hrs), Avg:103.2 F (39.6 C), Min:103.2 F (39.6 C), Max:103.2 F (39.6 C)  No results for input(s): WBC, CREATININE, LATICACIDVEN, VANCOTROUGH, VANCOPEAK, VANCORANDOM, GENTTROUGH, GENTPEAK, GENTRANDOM, TOBRATROUGH, TOBRAPEAK, TOBRARND, AMIKACINPEAK, AMIKACINTROU, AMIKACIN in the last 168 hours.  CrCl cannot be calculated (Patient's most recent lab result is older than the maximum 21 days allowed.).    No Known Allergies  Antimicrobials this admission: Ceftriaxone 07/24 >>   Microbiology results: 07/24 BCx: Sent 07/24 UCx: Sent   Thank you for allowing pharmacy to be a part of this patient's care.  Darrow Bussing, PharmD Pharmacy Resident 06/08/2016 7:08 PM

## 2016-06-08 NOTE — ED Provider Notes (Signed)
[facility] Emergency Department Provider Note   ____________________________________________  Time seen: Approximately [id number] PM  I have reviewed the triage vital signs and the nursing notes.   HISTORY  Chief Complaint Hyperglycemia and Weakness   HPI Emma Thomas is a 20 y.o. female with a history of diabetes and hypertension who is presenting to the emergency department today with weakness as well as back pain and a headache. She says that her symptoms started yesterday with body aches. She says that she thought she was just working a lot but they continued today. She says that she is also having left flank pain and left lower back pain which is a 12 out of 10 at this time. Says that her headache is about a 910 and was gradual in onset. Denies any neck pain. Says that she intermittently takes her insulin last took her insulin earlier today.His that she urinates frequently but this is her baseline. Denies any burning with urination. Says that she doesn't a history of urinary tract infections.   Past Medical History:  Diagnosis Date  . Diabetes mellitus without complication ([place])   . Hypertension     Patient Active Problem List   Diagnosis Date Noted  . DKA (diabetic ketoacidoses) ([place]) 09/09/2015    Past Surgical History:  Procedure Laterality Date  . abscess removal      Current Outpatient Rx  . Order #: [id number] Class: Print  . Order #: [id number] Class: Print  . Order #: [id number] Class: Normal    Allergies Review of patient's allergies indicates no known allergies.  Family History  Problem Relation Age of Onset  . Breast cancer Mother   . Diabetes type II Brother     Social History Social History  Substance Use Topics  . Smoking status: Never Smoker  . Smokeless tobacco: Never Used  . Alcohol use No    Review of Systems Constitutional: Weakness and chills Eyes: No visual changes. ENT: No sore throat. Cardiovascular: Denies  chest pain. Respiratory: Denies shortness of breath. Gastrointestinal: No abdominal pain.  No nausea, no vomiting.  No diarrhea.  No constipation. Genitourinary: Negative for dysuria. Musculoskeletal: Negative for back pain. Skin: Negative for rash. Neurological: Negative for headaches, focal weakness or numbness.  10-point ROS otherwise negative.  ____________________________________________   PHYSICAL EXAM:  VITAL SIGNS: ED Triage Vitals  Enc Vitals Group     BP 06/08/16 1819 (!) 142/95     Pulse Rate 06/08/16 1819 (!) 129     Resp 06/08/16 1819 (!) 22     Temp 06/08/16 1819 (!) 103.2 F (39.6 C)     Temp Source 06/08/16 1819 Oral     SpO2 06/08/16 1819 98 %     Weight 06/08/16 1819 145 lb (65.8 kg)     Height 06/08/16 1819 5\' 6"  (1.676 m)     Head Circumference --      Peak Flow --      Pain Score 06/08/16 1820 10     Pain Loc --      Pain Edu? --      Excl. in [place]? --     Constitutional: Alert and oriented. Ill-appearing. Eyes: Conjunctivae are normal. PERRL. EOMI. Head: Atraumatic. Nose: No congestion/rhinnorhea. Mouth/Throat: Mucous membranes are moist.   Neck: No stridor.  No meningismus. Ranges neck freely. Cardiovascular: Tachycardic, regular rhythm. Grossly normal heart sounds.   Respiratory: Normal respiratory effort.  No retractions. Lungs CTAB. Gastrointestinal: Soft and nontender. No distention. Left-sided CVA tenderness palpation. Musculoskeletal:  No lower extremity tenderness nor edema.  No joint effusions. Neurologic:  Normal speech and language. No gross focal neurologic deficits are appreciated. No gait instability. Skin:  Skin is warm, dry and intact. No rash noted. Psychiatric: Mood and affect are normal. Speech and behavior are normal.  ____________________________________________   LABS (all labs ordered are listed, but only abnormal results are displayed)  Labs Reviewed  GLUCOSE, CAPILLARY - Abnormal; Notable for the following:        Result Value   Glucose-Capillary 250 (*)    All other components within normal limits  CULTURE, BLOOD (ROUTINE X 2)  CULTURE, BLOOD (ROUTINE X 2)  URINE CULTURE  COMPREHENSIVE METABOLIC PANEL  CBC WITH DIFFERENTIAL/PLATELET  LACTIC ACID, PLASMA  LACTIC ACID, PLASMA  URINALYSIS COMPLETEWITH MICROSCOPIC ([facility] ONLY)   ____________________________________________  EKG   ____________________________________________  RADIOLOGY   ____________________________________________   PROCEDURES    Procedures   ____________________________________________   INITIAL IMPRESSION / ASSESSMENT AND PLAN / ED COURSE  Pertinent labs & imaging results that were available during my care of the patient were reviewed by me and considered in my medical decision making (see chart for details).  Sepsis alert called.  ----------------------------------------- 7:57 PM on 06/08/2016 ----------------------------------------- Patient with likely pyelonephritis. Also with anion gap acidosis likely a combination of early DKA as well as sepsis with an elevated lactic acid. Patient will be admitted to the hospital. Discussed management with Dr. Tressia Miners who recommended subcutaneous aspart because of the patient's relatively low glucose. Ceftriaxone given. Patient updated about diagnosis and plan.  ____________________________________________   FINAL CLINICAL IMPRESSION(S) / ED DIAGNOSES  Sepsis. Pyelonephritis. Diabetic ketoacidosis.    NEW MEDICATIONS STARTED DURING THIS VISIT:  New Prescriptions   No medications on file     Note:  This document was prepared using Dragon voice recognition software and may include unintentional dictation errors.    Orbie Pyo, MD 06/08/16 [phone]

## 2016-06-08 NOTE — ED Notes (Signed)
Report called to Ashley RN

## 2016-06-09 LAB — BASIC METABOLIC PANEL
Anion gap: 16 — ABNORMAL HIGH (ref 5–15)
Anion gap: 9 (ref 5–15)
BUN: 9 mg/dL (ref 6–20)
BUN: 5 mg/dL — AB (ref 6–20)
CO2: 14 mmol/L — ABNORMAL LOW (ref 22–32)
CO2: 19 mmol/L — ABNORMAL LOW (ref 22–32)
CREATININE: 0.51 mg/dL (ref 0.44–1.00)
Calcium: 7.8 mg/dL — ABNORMAL LOW (ref 8.9–10.3)
Calcium: 7.5 mg/dL — ABNORMAL LOW (ref 8.9–10.3)
Chloride: 104 mmol/L (ref 101–111)
Chloride: 103 mmol/L (ref 101–111)
Creatinine, Ser: 0.57 mg/dL (ref 0.44–1.00)
GFR calc Af Amer: >60 mL/min (ref >60)
Glucose, Bld: 246 mg/dL — ABNORMAL HIGH (ref 65–99)
Glucose, Bld: 122 mg/dL — ABNORMAL HIGH (ref 65–99)
POTASSIUM: 4.3 mmol/L (ref 3.5–5.1)
POTASSIUM: 3.2 mmol/L — AB (ref 3.5–5.1)
SODIUM: 134 mmol/L — AB (ref 135–145)
SODIUM: 131 mmol/L — AB (ref 135–145)

## 2016-06-09 LAB — GLUCOSE, CAPILLARY
GLUCOSE-CAPILLARY: 129 mg/dL — AB (ref 65–99)
GLUCOSE-CAPILLARY: 110 mg/dL — AB (ref 65–99)
Glucose-Capillary: 215 mg/dL — ABNORMAL HIGH (ref 65–99)
Glucose-Capillary: 226 mg/dL — ABNORMAL HIGH (ref 65–99)

## 2016-06-09 LAB — HEMOGLOBIN A1C: HEMOGLOBIN A1C: 14.4 % — AB (ref 4.0–6.0)

## 2016-06-09 LAB — CBC
HEMATOCRIT: 35.2 % (ref 35.0–47.0)
Hemoglobin: 12.3 g/dL (ref 12.0–16.0)
MCH: 32.6 pg (ref 26.0–34.0)
MCHC: 35 g/dL (ref 32.0–36.0)
MCV: 93.2 fL (ref 80.0–100.0)
PLATELETS: 252 10*3/uL (ref 150–440)
RBC: 3.78 MIL/uL — ABNORMAL LOW (ref 3.80–5.20)
RDW: 14.1 % (ref 11.5–14.5)
WBC: 15.9 10*3/uL — AB (ref 3.6–11.0)

## 2016-06-09 LAB — LACTIC ACID, PLASMA: Lactic Acid, Venous: 1.6 mmol/L (ref 0.5–1.9)

## 2016-06-09 MED ORDER — BUTALBITAL-APAP-CAFFEINE 50-325-40 MG PO TABS
1.0000 | ORAL_TABLET | Freq: Four times a day (QID) | ORAL | Status: DC | PRN
  Administered 2016-06-09 (×2): 1 via ORAL
  Filled 2016-06-09 (×2): qty 1

## 2016-06-09 MED ORDER — MORPHINE SULFATE (PF) 2 MG/ML IV SOLN
2.0000 mg | INTRAVENOUS | Status: DC | PRN
  Administered 2016-06-09 – 2016-06-11 (×5): 2 mg via INTRAVENOUS
  Filled 2016-06-09 (×5): qty 1

## 2016-06-09 MED ORDER — DIPHENHYDRAMINE HCL 25 MG PO CAPS
25.0000 mg | ORAL_CAPSULE | Freq: Four times a day (QID) | ORAL | Status: DC | PRN
  Administered 2016-06-09: 25 mg via ORAL
  Filled 2016-06-09: qty 1

## 2016-06-09 MED ORDER — SODIUM CHLORIDE 0.9 % IV BOLUS (SEPSIS)
500.0000 mL | Freq: Once | INTRAVENOUS | Status: AC
  Administered 2016-06-09: 500 mL via INTRAVENOUS

## 2016-06-09 NOTE — Progress Notes (Signed)
Dr. Margaretmary Eddy notified about patient's HR sustaining in the 120s.

## 2016-06-09 NOTE — Progress Notes (Signed)
[facility] [place] NAME: Emma Thomas    MR#:  [id number]  DATE OF BIRTH:  1996/06/07  SUBJECTIVE:  CHIEF COMPLAINT:  Patient's left flank pain is better. Denies any nausea vomiting. Denies any palpitations or chest pain.   REVIEW OF SYSTEMS:  CONSTITUTIONAL: No fever, fatigue or weakness.  EYES: No blurred or double vision.  EARS, NOSE, AND THROAT: No tinnitus or ear pain.  RESPIRATORY: No cough, shortness of breath, wheezing or hemoptysis.  CARDIOVASCULAR: No chest pain, orthopnea, edema.  GASTROINTESTINAL: No nausea, vomiting, diarrhea or abdominal pain.  GENITOURINARY: No dysuria, hematuria.  ENDOCRINE: No polyuria, nocturia,  HEMATOLOGY: No anemia, easy bruising or bleeding SKIN: No rash or lesion. MUSCULOSKELETAL: No joint pain or arthritis.  Improving left flank pain NEUROLOGIC: No tingling, numbness, weakness. Denies neck stiffness PSYCHIATRY: No anxiety or depression.   DRUG ALLERGIES:  No Known Allergies  VITALS:  Blood pressure 128/75, pulse (!) 116, temperature 100.1 F (37.8 C), temperature source Oral, resp. rate 17, height 5\' 6"  (1.676 m), weight 65.8 kg (145 lb), SpO2 98 %.  PHYSICAL EXAMINATION:  GENERAL:  20 y.o.-year-old patient lying in the bed with no acute distress.  EYES: Pupils equal, round, reactive to light and accommodation. No scleral icterus. Extraocular muscles intact.  HEENT: Head atraumatic, normocephalic. Oropharynx and nasopharynx clear.  NECK:  Supple, no jugular venous distention. No thyroid enlargement, no tenderness.  LUNGS: Normal breath sounds bilaterally, no wheezing, rales,rhonchi or crepitation. No use of accessory muscles of respiration.  CARDIOVASCULAR: S1, S2 normal. No murmurs, rubs, or gallops.  ABDOMEN: Soft, nontender, nondistended. Bowel sounds present. No organomegaly or mass. Very very minimal left flank tenderness EXTREMITIES: No pedal edema, cyanosis, or clubbing.   NEUROLOGIC: Cranial nerves II through XII are intact. Muscle strength 5/5 in all extremities. Sensation intact. Gait not checked. No meningeal signs PSYCHIATRIC: The patient is alert and oriented x 3.  SKIN: No obvious rash, lesion, or ulcer.    LABORATORY PANEL:   CBC  Recent Labs Lab 06/09/16 0415  WBC 15.9*  HGB 12.3  HCT 35.2  PLT 252   ------------------------------------------------------------------------------------------------------------------  Chemistries   Recent Labs Lab 06/08/16 1857  06/09/16 0415  NA 134*  < > 134*  K 4.1  < > 4.3  CL 101  < > 104  CO2 17*  < > 14*  GLUCOSE 271*  < > 246*  BUN 13  < > 9  CREATININE 0.84  < > 0.57  CALCIUM 8.6*  < > 7.8*  AST 20  --   --   ALT 11*  --   --   ALKPHOS 146*  --   --   BILITOT 1.9*  --   --   < > = values in this interval not displayed. ------------------------------------------------------------------------------------------------------------------  Cardiac Enzymes No results for input(s): TROPONINI in the last 168 hours. ------------------------------------------------------------------------------------------------------------------  RADIOLOGY:  No results found.  EKG:   Orders placed or performed during the hospital encounter of 02/03/16  . ED EKG  . ED EKG  . EKG 12-Lead  . EKG 12-Lead    ASSESSMENT AND PLAN:   Emma Thomas  is a 20 y.o. female with a known history of Type 1 diabetes mellitus noncompliant with her insulin regimen presents to emergency room secondary to fever and left flank pain.  #1 sepsis-Meets criteria with fever and tachycardia at the time of admission  secondary to UTI and acute pyelonephritis. -Blood cultures and urine  cultures with no growth in less than 24 hours -IV fluid resuscitation. -Lactic acid is back to normal 1.6 -Continue on Rocephin while pending cultures  #2 type 1 diabetes mellitus-uncontrolled diabetes mellitus due to  noncompliance. -Reinforced the importance of being complaint with her medications -on Levemir twice a day and pre-meal insulin. Also sliding scale insulin.  On carb controlled diet. -Anion gap on the borderline high side. IV fluid resuscitation and recheck another basic metabolic panel to night and in am -Check hemoglobin A1c  #3 sinus tachycardia in the setting of sepsis with fever  Provide IV fluids and antipyretics and monitor closely   #leukocytosis-secondary to her infection. Monitor. Repeat a.m. labs  #4 DVT prophylaxis-on Lovenox      All the records are reviewed and case discussed with Care Management/Social Workerr. Management plans discussed with the patient, family and they are in agreement.  CODE STATUS: fc  TOTAL TIME TAKING CARE OF THIS PATIENT: 36  minutes.   POSSIBLE D/C IN 2 DAYS, DEPENDING ON CLINICAL CONDITION.  Note: This dictation was prepared with Dragon dictation along with smaller phrase technology. Any transcriptional errors that result from this process are unintentional.   Nicholes Mango M.D on 06/09/2016 at 12:07 PM  Between 7am to 6pm - Pager - [phone] After 6pm go to [email or web address] - password [email or web address] [facility]  [facility]  Office  [phone]  CC: Primary care physician; [place]

## 2016-06-09 NOTE — Progress Notes (Signed)
Spoke to Dr. Margaretmary Eddy about elevated HR r/t sepsis. Bolus given x1. MD acknowledge and no new orders

## 2016-06-09 NOTE — Care Management Important Message (Deleted)
Important Message  Patient Details  Name: CAITRIONA WANNER MRN: [id number] Date of Birth: 16-Jan-1996   Medicare Important Message Given:  Yes    Iain Sawchuk A, RN 06/09/2016, 9:33 AM

## 2016-06-10 LAB — CBC WITH DIFFERENTIAL/PLATELET
BASOS ABS: 0.1 10*3/uL (ref 0–0.1)
Basophils Relative: 1 %
EOS ABS: 0.1 10*3/uL (ref 0–0.7)
EOS PCT: 1 %
HCT: 35.8 % (ref 35.0–47.0)
Hemoglobin: 12.4 g/dL (ref 12.0–16.0)
LYMPHS PCT: 21 %
Lymphs Abs: 2 10*3/uL (ref 1.0–3.6)
MCH: 31.8 pg (ref 26.0–34.0)
MCHC: 34.6 g/dL (ref 32.0–36.0)
MCV: 91.9 fL (ref 80.0–100.0)
Monocytes Absolute: 1.8 10*3/uL — ABNORMAL HIGH (ref 0.2–0.9)
Monocytes Relative: 19 %
NEUTROS PCT: 58 %
Neutro Abs: 5.6 10*3/uL (ref 1.4–6.5)
PLATELETS: 221 10*3/uL (ref 150–440)
RBC: 3.9 MIL/uL (ref 3.80–5.20)
RDW: 13.8 % (ref 11.5–14.5)
WBC: 9.5 10*3/uL (ref 3.6–11.0)

## 2016-06-10 LAB — URINE CULTURE

## 2016-06-10 LAB — GLUCOSE, CAPILLARY
GLUCOSE-CAPILLARY: 164 mg/dL — AB (ref 65–99)
GLUCOSE-CAPILLARY: 95 mg/dL (ref 65–99)
GLUCOSE-CAPILLARY: 185 mg/dL — AB (ref 65–99)
Glucose-Capillary: 190 mg/dL — ABNORMAL HIGH (ref 65–99)

## 2016-06-10 LAB — BASIC METABOLIC PANEL
ANION GAP: 6 (ref 5–15)
BUN: 6 mg/dL (ref 6–20)
CO2: 21 mmol/L — ABNORMAL LOW (ref 22–32)
Calcium: 7.6 mg/dL — ABNORMAL LOW (ref 8.9–10.3)
Chloride: 110 mmol/L (ref 101–111)
Creatinine, Ser: 0.49 mg/dL (ref 0.44–1.00)
GFR calc Af Amer: >60 mL/min (ref >60)
Glucose, Bld: 187 mg/dL — ABNORMAL HIGH (ref 65–99)
POTASSIUM: 2.8 mmol/L — AB (ref 3.5–5.1)
SODIUM: 137 mmol/L (ref 135–145)

## 2016-06-10 LAB — POTASSIUM: POTASSIUM: 3.7 mmol/L (ref 3.5–5.1)

## 2016-06-10 LAB — MAGNESIUM: MAGNESIUM: 2 mg/dL (ref 1.7–2.4)

## 2016-06-10 MED ORDER — MAGNESIUM SULFATE 2 GM/50ML IV SOLN
2.0000 g | Freq: Once | INTRAVENOUS | Status: AC
  Administered 2016-06-10: 2 g via INTRAVENOUS
  Filled 2016-06-10: qty 50

## 2016-06-10 MED ORDER — POTASSIUM CHLORIDE 10 MEQ/100ML IV SOLN
10.0000 meq | INTRAVENOUS | Status: AC
  Administered 2016-06-10 (×3): 10 meq via INTRAVENOUS
  Filled 2016-06-10 (×2): qty 100

## 2016-06-10 MED ORDER — POTASSIUM CHLORIDE CRYS ER 20 MEQ PO TBCR
40.0000 meq | EXTENDED_RELEASE_TABLET | Freq: Once | ORAL | Status: AC
  Administered 2016-06-10: 40 meq via ORAL
  Filled 2016-06-10: qty 2

## 2016-06-10 NOTE — Progress Notes (Addendum)
Inpatient Diabetes Program Recommendations  AACE/ADA: New Consensus Statement on Inpatient Glycemic Control (2015)  Target Ranges:  Prepandial:   less than 140 mg/dL      Peak postprandial:   less than 180 mg/dL (1-2 hours)      Critically ill patients:  140 - 180 mg/dL  Results for YAITZA, LOR (MRN [id number]) as of 06/10/2016 08:25  Ref. Range 06/09/2016 07:30 06/09/2016 11:47 06/09/2016 16:32 06/09/2016 21:34 06/10/2016 07:49  Glucose-Capillary Latest Ref Range: 65 - 99 mg/dL 215 (H) 129 (H) 110 (H) 226 (H) 164 (H)   Results for YARITZI, HAMMER (MRN [id number]) as of 06/10/2016 08:25  Ref. Range 06/08/2016 23:07  Hemoglobin A1C Latest Ref Range: 4.0 - 6.0 % 14.4 (H)   Review of Glycemic Control  Diabetes history: DM1 Outpatient Diabetes medications: Levemir 16 units QAM, Levemir 8 units QHS, Humalog 8 units TID with meals Current orders for Inpatient glycemic control: Levemir 16 units BID, Novolog 0-9 units TID with meals, Novolog 0-5 units QHS, Novolog 5 units TID with meals for meal coverage  Inpatient Diabetes Program Recommendations: Insulin - Meal Coverage: Please consider increasing meal coverage to Novolog 6 units TID with meals. HgbA1C: A1C 14.4% on 06/08/16 indicating an average glucose of 367 mg/dl over the past 2-3 months. Noted patient had initial visit with Dr. Graceann Congress (Endocrinologist) on 02/07/16 and has not seen since then. Patient needs to follow up with Endocrinologist as soon as possible regarding glycemic control.  Outpatient Diabetes Regimen: At time of discharge, please consider adjusting Levemir and Humalog dosages to improve glycemic control. In talking with the patient she is taking Levemir differently than prescribed by Dr. Graceann Congress at last office visit.  NOTE: Spoke with patient over the phone about diabetes and home regimen for diabetes control. Patient reports that she is followed by Dr. Graceann Congress for diabetes management and currently she takes Levemir 16 units  QAM, Levemir 8 units QHS, Humalog 8 units TID with meals as an outpatient for diabetes control. Patient reports that she takes insulin as prescribed but admits that she does often forget to take her insulin. Patient states that she checks her glucose 2-3 times per day and that it fluctuates up and down. Patient admits that she is not checking glucose as often as she should.  Discussed A1C results (14.4% on 06/08/16) and explained that her current A1C indicates an average glucose of 367 mg/dl over the past 2-3 months. Discussed glucose and A1C goals. Discussed importance of checking CBGs and maintaining good CBG control to prevent long-term and short-term complications.  Stressed to the patient the importance of improving glycemic control to prevent further complications from uncontrolled diabetes. Patient reports that she does not have a follow up visit scheduled with Dr. Graceann Congress but she states that she plans to call and make an appointment. Encouraged patient to talk with Dr. Graceann Congress about improving diabetes control. Patient states that she has been on Lantus in the past and that she was switched to Levemir. Discussed duration of Levemir and Lantus and suggested she talk with Dr. Graceann Congress about seeing if it would be appropriate to switch to Lantus and may be able to cut down to one injection for basal insulin instead of two since she forgets to take insulin frequently. Also discussed carbohydrate coverage and correction (with insulin to carb ratio and insulin sensitivity factor) and asked patient to talk with Dr. Graceann Congress about both.  Encouraged patient to check her glucose 3-4 times per day (before meals and at  bedtime) and to keep a log book of glucose readings and insulin taken which she will need to take to doctor appointments. Explained how Dr. Graceann Congress can use the log book to continue to make insulin adjustments if needed.  Discussed continuous glucose monitoring (CGM)and encouraged patient to talk with Dr.  Graceann Congress about CGM as well. Patient reports that Dr. Graceann Congress talked with her about potentially using an insulin pump at her last office visit. Discussed insulin pumps and informed patient that even with an insulin pump she would need to be checking her glucose QID (with meals and at bedtime) and counting carbohydrates.  Patient verbalized understanding of information discussed and she states that she has no further questions at this time related to diabetes.  In reviewing Care Everywhere noted Dr. Delorise Shiner office note dated 02/07/16 in which patient was prescribed the following:  "You should continue current regimen of insulin:  Inject 16 units of Levemir twice daily Inject 8 units of Humalog with meals 3 times daily. If you do not eat, skip your Humalog shot.  If blood glucose is running high before a meal, take extra insulin using the following scale:  BG reading Units of extra meal-time insulin 200-249 1 250-299 2 300-349 3 350-399 4 >400 5 "       Thanks, Barnie Alderman, RN, MSN, CDE Diabetes Coordinator Inpatient Diabetes Program [phone] (Team Pager from [place] to [place]) [phone] ([facility] office) [phone] [facility] office) [phone] [facility] office)

## 2016-06-10 NOTE — Progress Notes (Signed)
Patient ID: Emma Thomas, female   DOB: Sep 06, 1996, 20 y.o.   MRN: [id number]  [employer] PROGRESS NOTE  ANHAR RABORN [id number] DOB: 02/20/1996 DOA: 06/08/2016 PCP: [facility]  HPI/Subjective: Patient not hungry and not eating well. Feels better than when she came in. This morning's potassium very low.  Objective: Vitals:   06/10/16 0442 06/10/16 1328  BP: 122/90 127/81  Pulse: 100 (!) 107  Resp: 16 16  Temp: 97.6 F (36.4 C) 98 F (36.7 C)    Filed Weights   06/08/16 1819  Weight: 65.8 kg (145 lb)    ROS: Review of Systems  Constitutional: Negative for chills and fever.  Eyes: Negative for blurred vision.  Respiratory: Negative for cough and shortness of breath.   Cardiovascular: Negative for chest pain.  Gastrointestinal: Negative for abdominal pain, constipation, diarrhea, nausea and vomiting.  Genitourinary: Positive for flank pain. Negative for dysuria.  Musculoskeletal: Negative for joint pain.  Neurological: Positive for headaches. Negative for dizziness.   Exam: Physical Exam  Constitutional: She is oriented to person, place, and time.  HENT:  Nose: No mucosal edema.  Mouth/Throat: No oropharyngeal exudate or posterior oropharyngeal edema.  Eyes: Conjunctivae, EOM and lids are normal. Pupils are equal, round, and reactive to light.  Neck: No JVD present. Carotid bruit is not present. No edema present. No thyroid mass and no thyromegaly present.  Cardiovascular: S1 normal and S2 normal.  Exam reveals no gallop.   No murmur heard. Pulses:      Dorsalis pedis pulses are 2+ on the right side, and 2+ on the left side.  Respiratory: No respiratory distress. She has no wheezes. She has no rhonchi. She has no rales.  GI: Soft. Bowel sounds are normal. There is no tenderness.  Musculoskeletal:       Right ankle: She exhibits no swelling.       Left ankle: She exhibits no swelling.  Lymphadenopathy:    She has no cervical adenopathy.   Neurological: She is alert and oriented to person, place, and time. No cranial nerve deficit.  Skin: Skin is warm. No rash noted. Nails show no clubbing.  Psychiatric: She has a normal mood and affect.      Data Reviewed: Basic Metabolic Panel:  Recent Labs Lab 06/08/16 1857 06/08/16 2307 06/09/16 0415 06/09/16 1747 06/10/16 0425  NA 134* 134* 134* 131* 137  K 4.1 3.8 4.3 3.2* 2.8*  CL 101 109 104 103 110  CO2 17* 16* 14* 19* 21*  GLUCOSE 271* 176* 246* 122* 187*  BUN 13 10 9  5* 6  CREATININE 0.84 0.69 0.57 0.51 0.49  CALCIUM 8.6* 7.7* 7.8* 7.5* 7.6*  MG  --   --   --   --  2.0   Liver Function Tests:  Recent Labs Lab 06/08/16 1857  AST 20  ALT 11*  ALKPHOS 146*  BILITOT 1.9*  PROT 8.1  ALBUMIN 3.4*   CBC:  Recent Labs Lab 06/08/16 1857 06/09/16 0415 06/10/16 0425  WBC 15.7* 15.9* 9.5  NEUTROABS 12.5*  --  5.6  HGB 13.3 12.3 12.4  HCT 39.3 35.2 35.8  MCV 92.2 93.2 91.9  PLT 318 252 221    CBG:  Recent Labs Lab 06/09/16 1147 06/09/16 1632 06/09/16 2134 06/10/16 0749 06/10/16 1121  GLUCAP 129* 110* 226* 164* 95    Recent Results (from the past 240 hour(s))  Blood Culture (routine x 2)     Status: None (Preliminary result)  Collection Time: 06/08/16  6:57 PM  Result Value Ref Range Status   Specimen Description BLOOD RIGHT ASSIST CONTROL  Final   Special Requests BOTTLES DRAWN AEROBIC AND ANAEROBIC 5CC  Final   Culture NO GROWTH 2 DAYS  Final   Report Status PENDING  Incomplete  Blood Culture (routine x 2)     Status: None (Preliminary result)   Collection Time: 06/08/16  6:57 PM  Result Value Ref Range Status   Specimen Description BLOOD LEFT ASSIST CONTROL  Final   Special Requests BOTTLES DRAWN AEROBIC AND ANAEROBIC 1CC  Final   Culture NO GROWTH 2 DAYS  Final   Report Status PENDING  Incomplete  Urine culture     Status: Abnormal   Collection Time: 06/08/16  6:57 PM  Result Value Ref Range Status   Specimen Description URINE,  RANDOM  Final   Special Requests NONE  Final   Culture MULTIPLE SPECIES PRESENT, SUGGEST RECOLLECTION (A)  Final   Report Status 06/10/2016 FINAL  Final      Scheduled Meds: . cefTRIAXone (ROCEPHIN)  IV  1 g Intravenous Q24H  . enoxaparin (LOVENOX) injection  40 mg Subcutaneous Q24H  . insulin aspart  0-5 Units Subcutaneous QHS  . insulin aspart  0-9 Units Subcutaneous TID WC  . insulin aspart  5 Units Subcutaneous TID WC  . insulin detemir  16 Units Subcutaneous BID  . sodium chloride flush  3 mL Intravenous Q12H   Continuous Infusions: . sodium chloride 125 mL/hr at 06/10/16 1359    Assessment/Plan:  1. Hypokalemia. Replace IV and orally today. Recheck today and tomorrow morning. Patient advised that she must eat. 2. Clinical sepsis and acute pyelonephritis. Urine culture growing for organisms likely contamination. Continue Rocephin. Potential discharge tomorrow. 3. Type 1 diabetes mellitus uncontrolled. Sugars trending better. A1c very elevated at 14 4. Sinus tachycardia with sepsis 5. Leukocytosis trending better  Code Status:     Code Status Orders        Start     Ordered   06/08/16 2137  Full code  Continuous     06/08/16 2136    Code Status History    Date Active Date Inactive Code Status Order ID Comments User Context   02/03/2016  2:31 PM 02/04/2016  4:16 PM Full Code [id number]  Hillary Bow, MD ED   09/09/2015 11:02 AM 09/10/2015  3:32 PM Full Code [id number]  Henreitta Leber, MD Inpatient     Disposition Plan: Home tomorrow  Antibiotics:  Rocephin  Time spent: 25 minutes  Loletha Grayer  [employer]

## 2016-06-11 LAB — GLUCOSE, CAPILLARY: GLUCOSE-CAPILLARY: 87 mg/dL (ref 65–99)

## 2016-06-11 LAB — BASIC METABOLIC PANEL
Anion gap: 9 (ref 5–15)
CHLORIDE: 106 mmol/L (ref 101–111)
CO2: 23 mmol/L (ref 22–32)
Calcium: 7.9 mg/dL — ABNORMAL LOW (ref 8.9–10.3)
Creatinine, Ser: 0.44 mg/dL (ref 0.44–1.00)
GFR calc non Af Amer: >60 mL/min (ref >60)
Glucose, Bld: 77 mg/dL (ref 65–99)
POTASSIUM: 3.4 mmol/L — AB (ref 3.5–5.1)
SODIUM: 138 mmol/L (ref 135–145)

## 2016-06-11 LAB — HEMOGLOBIN A1C: Hgb A1c MFr Bld: 14 % — ABNORMAL HIGH (ref 4.0–6.0)

## 2016-06-11 MED ORDER — POTASSIUM CHLORIDE CRYS ER 20 MEQ PO TBCR
40.0000 meq | EXTENDED_RELEASE_TABLET | Freq: Once | ORAL | Status: AC
  Administered 2016-06-11: 40 meq via ORAL
  Filled 2016-06-11: qty 2

## 2016-06-11 MED ORDER — INSULIN DETEMIR 100 UNIT/ML FLEXPEN: 15.0000 [IU] | PEN_INJECTOR | Freq: Two times a day (BID) | SUBCUTANEOUS | 11 refills | Status: DC

## 2016-06-11 MED ORDER — CEPHALEXIN 500 MG PO CAPS: 500.0000 mg | ORAL_CAPSULE | Freq: Three times a day (TID) | ORAL | 0 refills | Status: DC

## 2016-06-11 MED ORDER — CEPHALEXIN 500 MG PO CAPS
500.0000 mg | ORAL_CAPSULE | Freq: Three times a day (TID) | ORAL | Status: DC
  Administered 2016-06-11: 500 mg via ORAL
  Filled 2016-06-11: qty 1

## 2016-06-11 MED ORDER — INSULIN LISPRO 100 UNIT/ML ~~LOC~~ SOLN: 5.0000 [IU] | Freq: Three times a day (TID) | SUBCUTANEOUS | 11 refills | Status: DC

## 2016-06-11 MED ORDER — HYDROCODONE-ACETAMINOPHEN 5-325 MG PO TABS: 1.0000 | ORAL_TABLET | Freq: Four times a day (QID) | ORAL | 0 refills | Status: DC | PRN

## 2016-06-11 NOTE — Progress Notes (Signed)
Patient ID: Emma Thomas, female   DOB: 06/15/1996, 20 y.o.   MRN: [id number] [place] [facility] [place] was admitted to the Hospital on 06/08/2016 and Discharged  06/11/2016 and should be excused from work/school   for 3  days starting 06/08/2016 , may return to work/school without any restrictions.  Loletha Grayer M.D on 06/11/2016,at 7:47 AM  [place] [facility] [place]

## 2016-06-12 NOTE — Discharge Summary (Signed)
[place] [facility] [place] NAME: Emma Thomas    MR#:  [id number]  DATE OF BIRTH:  07/10/1996  DATE OF ADMISSION:  06/08/2016 ADMITTING PHYSICIAN: Gladstone Lighter, MD  DATE OF DISCHARGE: 06/11/2016  9:08 AM  PRIMARY CARE PHYSICIAN: [facility]    ADMISSION DIAGNOSIS:  Pyelonephritis [N12] Sepsis, due to unspecified organism ([place]) [A41.9] Diabetic ketoacidosis without coma associated with type 1 diabetes mellitus ([place]) [E10.10]  DISCHARGE DIAGNOSIS:  Active Problems:   Sepsis ([place])   SECONDARY DIAGNOSIS:   Past Medical History:  Diagnosis Date  . Diabetes mellitus without complication (HCC)    Type 1 DM  . Hypertension     HOSPITAL COURSE:   1. Clinical sepsis and pyelonephritis. Urine culture with mixed organisms. Patient was on Rocephin while here. Patient given Keflex upon discharge home. 2. Hypokalemia patient received IV and oral potassium replacement. This was likely secondary to poor appetite while here in the hospital. 3. Type 1 diabetes mellitus uncontrolled. Hemoglobin A1c very elevated at 14. Compliance with diabetic regimen explained to the patient. 4. Sinus tachycardia with sepsis. 5. Leukocytosis which trended better.  DISCHARGE CONDITIONS:   Satisfactory  CONSULTS OBTAINED:   None DRUG ALLERGIES:  No Known Allergies  DISCHARGE MEDICATIONS:   Discharge Medication List as of 06/11/2016  7:53 AM    START taking these medications   Details  cephALEXin (KEFLEX) 500 MG capsule Take 1 capsule (500 mg total) by mouth every 8 (eight) hours., Starting Thu 06/11/2016, Print    HYDROcodone-acetaminophen (NORCO/VICODIN) 5-325 MG tablet Take 1 tablet by mouth every 6 (six) hours as needed for moderate pain., Starting Thu 06/11/2016, Print      CONTINUE these medications which have CHANGED   Details  Insulin Detemir (LEVEMIR FLEXPEN) 100 UNIT/ML Pen Inject 15 Units into the skin 2 (two) times daily.,  Starting Thu 06/11/2016, No Print    insulin lispro (HUMALOG) 100 UNIT/ML injection Inject 0.05 mLs (5 Units total) into the skin 3 (three) times daily before meals., Starting Thu 06/11/2016, No Print         DISCHARGE INSTRUCTIONS:   Follow-up with PMD one week. Follow-up with endocrinologist as soon as possible.  If you experience worsening of your admission symptoms, develop shortness of breath, life threatening emergency, suicidal or homicidal thoughts you must seek medical attention immediately by calling 911 or calling your MD immediately  if symptoms less severe.  You Must read complete instructions/literature along with all the possible adverse reactions/side effects for all the Medicines you take and that have been prescribed to you. Take any new Medicines after you have completely understood and accept all the possible adverse reactions/side effects.   Please note  You were cared for by a hospitalist during your hospital stay. If you have any questions about your discharge medications or the care you received while you were in the hospital after you are discharged, you can call the unit and asked to speak with the hospitalist on call if the hospitalist that took care of you is not available. Once you are discharged, your primary care physician will handle any further medical issues. Please note that NO REFILLS for any discharge medications will be authorized once you are discharged, as it is imperative that you return to your primary care physician (or establish a relationship with a primary care physician if you do not have one) for your aftercare needs so that they can reassess your need for medications and monitor  your lab values.    Today   CHIEF COMPLAINT:   Chief Complaint  Patient presents with  . Hyperglycemia  . Weakness    HISTORY OF PRESENT ILLNESS:  Emma Thomas  is a 20 y.o. female presented with hyperglycemia and weakness and found to have urinary tract  infection   VITAL SIGNS:  Blood pressure 108/65, pulse (!) 101, temperature 98.2 F (36.8 C), temperature source Oral, resp. rate 20, height 5\' 6"  (1.676 m), weight 65.8 kg (145 lb), SpO2 99 %.  I/O:  No intake or output data in the 24 hours ending 06/12/16 1535  PHYSICAL EXAMINATION:  GENERAL:  20 y.o.-year-old patient lying in the bed with no acute distress.  EYES: Pupils equal, round, reactive to light and accommodation. No scleral icterus. Extraocular muscles intact.  HEENT: Head atraumatic, normocephalic. Oropharynx and nasopharynx clear.  NECK:  Supple, no jugular venous distention. No thyroid enlargement, no tenderness.  LUNGS: Normal breath sounds bilaterally, no wheezing, rales,rhonchi or crepitation. No use of accessory muscles of respiration.  CARDIOVASCULAR: S1, S2 normal. No murmurs, rubs, or gallops.  ABDOMEN: Soft, non-tender, non-distended. Bowel sounds present. No organomegaly or mass.  EXTREMITIES: No pedal edema, cyanosis, or clubbing.  NEUROLOGIC: Cranial nerves II through XII are intact. Muscle strength 5/5 in all extremities. Sensation intact. Gait not checked.  PSYCHIATRIC: The patient is alert and oriented x 3.  SKIN: No obvious rash, lesion, or ulcer.   DATA REVIEW:   CBC  Recent Labs Lab 06/10/16 0425  WBC 9.5  HGB 12.4  HCT 35.8  PLT 221    Chemistries   Recent Labs Lab 06/08/16 1857  06/10/16 0425  06/11/16 0344  NA 134*  < > 137  --  138  K 4.1  < > 2.8*  < > 3.4*  CL 101  < > 110  --  106  CO2 17*  < > 21*  --  23  GLUCOSE 271*  < > 187*  --  77  BUN 13  < > 6  --  <5*  CREATININE 0.84  < > 0.49  --  0.44  CALCIUM 8.6*  < > 7.6*  --  7.9*  MG  --   --  2.0  --   --   AST 20  --   --   --   --   ALT 11*  --   --   --   --   ALKPHOS 146*  --   --   --   --   BILITOT 1.9*  --   --   --   --   < > = values in this interval not displayed.   Microbiology Results  Results for orders placed or performed during the hospital encounter of  06/08/16  Blood Culture (routine x 2)     Status: None (Preliminary result)   Collection Time: 06/08/16  6:57 PM  Result Value Ref Range Status   Specimen Description BLOOD RIGHT ASSIST CONTROL  Final   Special Requests BOTTLES DRAWN AEROBIC AND ANAEROBIC 5CC  Final   Culture NO GROWTH 3 DAYS  Final   Report Status PENDING  Incomplete  Blood Culture (routine x 2)     Status: None (Preliminary result)   Collection Time: 06/08/16  6:57 PM  Result Value Ref Range Status   Specimen Description BLOOD LEFT ASSIST CONTROL  Final   Special Requests BOTTLES DRAWN AEROBIC AND ANAEROBIC 1CC  Final   Culture NO GROWTH 3  DAYS  Final   Report Status PENDING  Incomplete  Urine culture     Status: Abnormal   Collection Time: 06/08/16  6:57 PM  Result Value Ref Range Status   Specimen Description URINE, RANDOM  Final   Special Requests NONE  Final   Culture MULTIPLE SPECIES PRESENT, SUGGEST RECOLLECTION (A)  Final   Report Status 06/10/2016 FINAL  Final    Management plans discussed with the patient, family and they are in agreement.  CODE STATUS:  Code Status History    Date Active Date Inactive Code Status Order ID Comments User Context   06/08/2016  9:36 PM 06/11/2016 12:08 PM Full Code [id number]  Gladstone Lighter, MD Inpatient   02/03/2016  2:31 PM 02/04/2016  4:16 PM Full Code [id number]  Hillary Bow, MD ED   09/09/2015 11:02 AM 09/10/2015  3:32 PM Full Code [id number]  Henreitta Leber, MD Inpatient      TOTAL TIME TAKING CARE OF THIS PATIENT: 35 minutes.    Loletha Grayer M.D on 06/12/2016 at 3:35 PM  Between 7am to 6pm - Pager - [phone]  After 6pm go to [email or web address] - Proofreader  [employer] Office  [phone]  CC: Primary care physician; [place]

## 2016-06-13 LAB — CULTURE, BLOOD (ROUTINE X 2)
CULTURE: NO GROWTH
Culture: NO GROWTH

## 2016-07-09 ENCOUNTER — Ambulatory Visit
Admission: EM | Admit: 2016-07-09 | Discharge: 2016-07-09 | Disposition: A | Discharge status: Home / Self Care | Payer: 59 | Attending: Family Medicine | Admitting: Family Medicine

## 2016-07-09 ENCOUNTER — Encounter: Payer: Self-pay | Admitting: *Deleted

## 2016-07-09 DIAGNOSIS — K029 Dental caries, unspecified: Secondary | ICD-10-CM

## 2016-07-09 DIAGNOSIS — K0889 Other specified disorders of teeth and supporting structures: Secondary | ICD-10-CM | POA: Diagnosis not present

## 2016-07-09 DIAGNOSIS — K047 Periapical abscess without sinus: Secondary | ICD-10-CM | POA: Diagnosis not present

## 2016-07-09 MED ORDER — HYDROCODONE-ACETAMINOPHEN 5-325 MG PO TABS: 1.0000 | ORAL_TABLET | Freq: Three times a day (TID) | ORAL | 0 refills | Status: DC | PRN

## 2016-07-09 MED ORDER — AMOXICILLIN-POT CLAVULANATE 875-125 MG PO TABS: 1.0000 | ORAL_TABLET | Freq: Two times a day (BID) | ORAL | 0 refills | Status: DC

## 2016-07-09 MED ORDER — MELOXICAM 15 MG PO TABS: 15.0000 mg | ORAL_TABLET | Freq: Every day | ORAL | 0 refills | Status: DC

## 2016-07-09 NOTE — ED Triage Notes (Signed)
Dental pain from broken tooth on rear left upper x1 week.

## 2016-07-09 NOTE — ED Provider Notes (Signed)
[facility]    CSN: [id number] Arrival date & time: 07/09/16  1200  First Provider Contact:  First MD Initiated Contact with Patient 07/09/16 1256        History   Chief Complaint Chief Complaint  Patient presents with  . Dental Pain    HPI Emma Thomas is a 20 y.o. female.   Patient's here because of dental pain. She's had several teeth break in her mouth. About 2 weeks ago to the molars on the left side of her mouth broke. Initiation there was a lot of discomfort lately she's noticed bleeding discomfort which eats tries to swallow and increased pain. She states she hasn't called for dental appointment but early cigars see her is about mid September. She's not allergic to anything she does have diabetes and his type 1 diabetes and apparently she is very brittle with the mid section for sepsis and diabetic ketoacidosis before in the past. She is a gravida 0 para 0 station number metoprolol at this time. Had multiple abscesses before. She does not smoke. Mother type 2 diabetes and mother with breast cancer no known drug allergies.   The history is provided by the patient. No language interpreter was used.  Dental Pain  Location:  Upper Quality:  Constant Severity:  Moderate Duration:  2 weeks Timing:  Constant Progression:  Worsening Chronicity:  New Context: dental fracture   Relieved by:  Nothing Worsened by:  Cold food/drink Ineffective treatments:  Acetaminophen and NSAIDs Associated symptoms: facial pain, facial swelling, gum swelling, oral bleeding and oral lesions   Risk factors: diabetes, lack of dental care and periodontal disease   Risk factors: no alcohol problem, no cancer, no chewing tobacco use and no smoking     Past Medical History:  Diagnosis Date  . Diabetes mellitus without complication (HCC)    Type 1 DM  . Hypertension     Patient Active Problem List   Diagnosis Date Noted  . Sepsis ([place]) 06/08/2016  . DKA (diabetic ketoacidoses)  ([place]) 09/09/2015    Past Surgical History:  Procedure Laterality Date  . abscess removal      OB History    No data available       Home Medications    Prior to Admission medications   Medication Sig Start Date End Date Taking? Authorizing Provider  insulin aspart (NOVOLOG) 100 UNIT/ML injection Inject 8 Units into the skin 3 (three) times daily before meals.   Yes Historical Provider, MD  amoxicillin-clavulanate (AUGMENTIN) 875-125 MG tablet Take 1 tablet by mouth 2 (two) times daily. 07/09/16   Frederich Cha, MD  cephALEXin (KEFLEX) 500 MG capsule Take 1 capsule (500 mg total) by mouth every 8 (eight) hours. 06/11/16   Loletha Grayer, MD  HYDROcodone-acetaminophen (NORCO) 5-325 MG tablet Take 1 tablet by mouth every 8 (eight) hours as needed for moderate pain. 07/09/16   Frederich Cha, MD  HYDROcodone-acetaminophen (NORCO/VICODIN) 5-325 MG tablet Take 1 tablet by mouth every 6 (six) hours as needed for moderate pain. 06/11/16   Loletha Grayer, MD  Insulin Detemir (LEVEMIR FLEXPEN) 100 UNIT/ML Pen Inject 15 Units into the skin 2 (two) times daily. 06/11/16   Loletha Grayer, MD  insulin lispro (HUMALOG) 100 UNIT/ML injection Inject 0.05 mLs (5 Units total) into the skin 3 (three) times daily before meals. 06/11/16   Loletha Grayer, MD  meloxicam (MOBIC) 15 MG tablet Take 1 tablet (15 mg total) by mouth daily. Do not take with Motrin or Naprosyn 07/09/16  Frederich Cha, MD    Family History Family History  Problem Relation Age of Onset  . Breast cancer Mother   . Diabetes type II Brother     Social History Social History  Substance Use Topics  . Smoking status: Never Smoker  . Smokeless tobacco: Never Used  . Alcohol use Yes     Allergies   Review of patient's allergies indicates no known allergies.   Review of Systems Review of Systems  HENT: Positive for facial swelling and mouth sores.   All other systems reviewed and are negative.    Physical Exam Triage Vital  Signs ED Triage Vitals  Enc Vitals Group     BP 07/09/16 1213 125/83     Pulse Rate 07/09/16 1213 (!) 101     Resp 07/09/16 1213 16     Temp 07/09/16 1213 98.7 F (37.1 C)     Temp Source 07/09/16 1213 Oral     SpO2 07/09/16 1213 100 %     Weight 07/09/16 1215 150 lb (68 kg)     Height 07/09/16 1215 5\' 6"  (1.676 m)     Head Circumference --      Peak Flow --      Pain Score 07/09/16 1218 6     Pain Loc --      Pain Edu? --      Excl. in [place]? --    No data found.   Updated Vital Signs BP 125/83 (BP Location: Left Arm)   Pulse (!) 101   Temp 98.7 F (37.1 C) (Oral)   Resp 16   Ht 5\' 6"  (1.676 m)   Wt 150 lb (68 kg)   LMP  (LMP Unknown) Comment: I'm on birth control, I don't have it.    SpO2 100%   BMI 24.21 kg/m   Visual Acuity Right Eye Distance:   Left Eye Distance:   Bilateral Distance:    Right Eye Near:   Left Eye Near:    Bilateral Near:     Physical Exam  Constitutional: She is oriented to person, place, and time. She appears well-developed and well-nourished.  HENT:  Head: Normocephalic.  Right Ear: Hearing, tympanic membrane, external ear and ear canal normal.  Left Ear: Hearing, tympanic membrane, external ear and ear canal normal.  Nose: Nose normal. Right sinus exhibits no maxillary sinus tenderness and no frontal sinus tenderness. Left sinus exhibits no maxillary sinus tenderness and no frontal sinus tenderness.  Mouth/Throat: Uvula is midline. Oral lesions present. Abnormal dentition. Dental caries present. No posterior oropharyngeal edema or tonsillar abscesses.    A short multiple dental caries she has swelling in the upper gum as well and swelling is present as well as increase erythema  Eyes: Pupils are equal, round, and reactive to light.  Neck: Normal range of motion. Neck supple.  Pulmonary/Chest: Effort normal.  Lymphadenopathy:    She has cervical adenopathy.  Neurological: She is alert and oriented to person, place, and time.  Skin:  Skin is warm and dry.  Psychiatric: She has a normal mood and affect.  Vitals reviewed.    UC Treatments / Results  Labs (all labs ordered are listed, but only abnormal results are displayed) Labs Reviewed - No data to display  EKG  EKG Interpretation None       Radiology No results found.  Procedures Procedures (including critical care time)  Medications Ordered in UC Medications - No data to display   Initial Impression / Assessment and  Plan / UC Course  I have reviewed the triage vital signs and the nursing notes.  Pertinent labs & imaging results that were available during my care of the patient were reviewed by me and considered in my medical decision making (see chart for details).  Clinical Course    Patient was looked up at the [facility] reporting site. She did get Vicodin in July and 2 other times this year but no clear-cut pattern is been established and she's been in the ED because of abscesses and pyelonephritis and she is also been admitted for sepsis before recently. We'll place on Vicodin 1 tablet every 6-8 hours when necessary basis we'll place Augmentin for infection 875 one tablet twice a day Mobic 10 mg of pain and strongly suggest she follow-up with PCP or try to get another dental appointment earlier.  Final Clinical Impressions(s) / UC Diagnoses   Final diagnoses:  Pain, dental  Dental caries  Pain due to dental caries  Dental infection    New Prescriptions New Prescriptions   AMOXICILLIN-CLAVULANATE (AUGMENTIN) 875-125 MG TABLET    Take 1 tablet by mouth 2 (two) times daily.   HYDROCODONE-ACETAMINOPHEN (NORCO) 5-325 MG TABLET    Take 1 tablet by mouth every 8 (eight) hours as needed for moderate pain.   MELOXICAM (MOBIC) 15 MG TABLET    Take 1 tablet (15 mg total) by mouth daily. Do not take with Motrin or Naprosyn    Note: This dictation was prepared with Dragon dictation along with smaller phrase technology. Any transcriptional  errors that result from this process are unintentional.   Frederich Cha, MD 07/09/16 1335

## 2016-07-21 ENCOUNTER — Encounter: Payer: Self-pay | Admitting: Emergency Medicine

## 2016-07-21 ENCOUNTER — Ambulatory Visit
Admission: EM | Admit: 2016-07-21 | Discharge: 2016-07-21 | Disposition: A | Discharge status: Home / Self Care | Payer: 59 | Attending: Family Medicine | Admitting: Family Medicine

## 2016-07-21 DIAGNOSIS — H109 Unspecified conjunctivitis: Secondary | ICD-10-CM

## 2016-07-21 MED ORDER — MOXIFLOXACIN HCL 0.5 % OP SOLN: 1.0000 [drp] | Freq: Three times a day (TID) | OPHTHALMIC | 0 refills | Status: DC

## 2016-07-21 NOTE — ED Provider Notes (Signed)
[facility]    CSN: [id number] Arrival date & time: 07/21/16  1555  First Provider Contact:  None       History   Chief Complaint Chief Complaint  Patient presents with  . Eye Problem    HPI Emma Thomas is a 20 y.o. female.   The history is provided by the patient.  Eye Problem  Location:  Right eye Quality:  Tearing, aching and burning Severity:  Moderate Onset quality:  Sudden Duration:  1 day Timing:  Constant Progression:  Worsening Context: not burn, not chemical exposure, not contact lens problem, not direct trauma, not foreign body, not using machinery, not scratch, not smoke exposure and not UV exposure   Relieved by:  None tried Associated symptoms: crusting, discharge, itching, redness and tearing   Associated symptoms: no blurred vision, no decreased vision, no double vision, no facial rash, no headaches, no inflammation, no nausea, no numbness, no photophobia, no scotomas, no swelling, no tingling, no vomiting and no weakness     Past Medical History:  Diagnosis Date  . Diabetes mellitus without complication (HCC)    Type 1 DM  . Hypertension     Patient Active Problem List   Diagnosis Date Noted  . Sepsis ([place]) 06/08/2016  . DKA (diabetic ketoacidoses) ([place]) 09/09/2015    Past Surgical History:  Procedure Laterality Date  . abscess removal      OB History    No data available       Home Medications    Prior to Admission medications   Medication Sig Start Date End Date Taking? Authorizing Provider  HYDROcodone-acetaminophen (NORCO) 5-325 MG tablet Take 1 tablet by mouth every 8 (eight) hours as needed for moderate pain. 07/09/16   Frederich Cha, MD  insulin aspart (NOVOLOG) 100 UNIT/ML injection Inject 8 Units into the skin 3 (three) times daily before meals.    Historical Provider, MD  Insulin Detemir (LEVEMIR FLEXPEN) 100 UNIT/ML Pen Inject 15 Units into the skin 2 (two) times daily. 06/11/16   Loletha Grayer, MD  insulin  lispro (HUMALOG) 100 UNIT/ML injection Inject 0.05 mLs (5 Units total) into the skin 3 (three) times daily before meals. 06/11/16   Loletha Grayer, MD  meloxicam (MOBIC) 15 MG tablet Take 1 tablet (15 mg total) by mouth daily. Do not take with Motrin or Naprosyn 07/09/16   Frederich Cha, MD  moxifloxacin (VIGAMOX) 0.5 % ophthalmic solution Place 1 drop into the right eye 3 (three) times daily. 07/21/16   Norval Gable, MD    Family History Family History  Problem Relation Age of Onset  . Breast cancer Mother   . Diabetes type II Brother     Social History Social History  Substance Use Topics  . Smoking status: Never Smoker  . Smokeless tobacco: Never Used  . Alcohol use Yes     Allergies   Review of patient's allergies indicates no known allergies.   Review of Systems Review of Systems  Eyes: Positive for discharge, redness and itching. Negative for blurred vision, double vision and photophobia.  Gastrointestinal: Negative for nausea and vomiting.  Neurological: Negative for tingling, weakness, numbness and headaches.     Physical Exam Triage Vital Signs ED Triage Vitals  Enc Vitals Group     BP 07/21/16 1603 99/71     Pulse Rate 07/21/16 1603 (!) 108     Resp 07/21/16 1603 16     Temp 07/21/16 1603 98 F (36.7 C)  Temp Source 07/21/16 1603 Oral     SpO2 07/21/16 1603 100 %     Weight 07/21/16 1604 150 lb (68 kg)     Height 07/21/16 1604 5\' 6"  (1.676 m)     Head Circumference --      Peak Flow --      Pain Score 07/21/16 1606 7     Pain Loc --      Pain Edu? --      Excl. in [place]? --    No data found.   Updated Vital Signs BP 99/71 (BP Location: Left Arm)   Pulse (!) 108   Temp 98 F (36.7 C) (Oral)   Resp 16   Ht 5\' 6"  (1.676 m)   Wt 150 lb (68 kg)   SpO2 100%   BMI 24.21 kg/m   Visual Acuity Right Eye Distance: 20/70 uncorrected Left Eye Distance: 20/25 uncorrected Bilateral Distance:    Right Eye Near:   Left Eye Near:    Bilateral Near:      Physical Exam  Constitutional: She appears well-developed and well-nourished. No distress.  Eyes: EOM and lids are normal. Pupils are equal, round, and reactive to light. Right eye exhibits discharge. Left eye exhibits no discharge. Right conjunctiva is injected. Left conjunctiva is not injected.  Skin: She is not diaphoretic.  Nursing note and vitals reviewed.    UC Treatments / Results  Labs (all labs ordered are listed, but only abnormal results are displayed) Labs Reviewed - No data to display  EKG  EKG Interpretation None       Radiology No results found.  Procedures Procedures (including critical care time)  Medications Ordered in UC Medications - No data to display   Initial Impression / Assessment and Plan / UC Course  I have reviewed the triage vital signs and the nursing notes.  Pertinent labs & imaging results that were available during my care of the patient were reviewed by me and considered in my medical decision making (see chart for details).  Clinical Course      Final Clinical Impressions(s) / UC Diagnoses   Final diagnoses:  Conjunctivitis, right eye    New Prescriptions Discharge Medication List as of 07/21/2016  4:32 PM    START taking these medications   Details  moxifloxacin (VIGAMOX) 0.5 % ophthalmic solution Place 1 drop into the right eye 3 (three) times daily., Starting Tue 07/21/2016, Normal       1. diagnosis reviewed with patient 2. rx as per orders above; reviewed possible side effects, interactions, risks and benefits  3. Recommend supportive treatment with cool compresses 4. Follow-up prn if symptoms worsen or don't improve   Norval Gable, MD 07/21/16 1914

## 2016-07-21 NOTE — ED Triage Notes (Signed)
Patient c/o redness and drainage out of her right eye that started yesterday.  Patient denies fevers.

## 2016-07-30 ENCOUNTER — Inpatient Hospital Stay
Admission: EM | Admit: 2016-07-30 | Discharge: 2016-08-02 | DRG: 872 | Disposition: A | Discharge status: Home / Self Care | Payer: 59 | Attending: Internal Medicine | Admitting: Internal Medicine

## 2016-07-30 ENCOUNTER — Encounter: Payer: Self-pay | Admitting: Emergency Medicine

## 2016-07-30 ENCOUNTER — Emergency Department: Payer: 59

## 2016-07-30 DIAGNOSIS — I10 Essential (primary) hypertension: Secondary | ICD-10-CM | POA: Diagnosis present

## 2016-07-30 DIAGNOSIS — N12 Tubulo-interstitial nephritis, not specified as acute or chronic: Secondary | ICD-10-CM | POA: Diagnosis not present

## 2016-07-30 DIAGNOSIS — N10 Acute tubulo-interstitial nephritis: Secondary | ICD-10-CM | POA: Diagnosis present

## 2016-07-30 DIAGNOSIS — Z803 Family history of malignant neoplasm of breast: Secondary | ICD-10-CM

## 2016-07-30 DIAGNOSIS — Z833 Family history of diabetes mellitus: Secondary | ICD-10-CM

## 2016-07-30 DIAGNOSIS — E109 Type 1 diabetes mellitus without complications: Secondary | ICD-10-CM | POA: Diagnosis present

## 2016-07-30 DIAGNOSIS — A419 Sepsis, unspecified organism: Secondary | ICD-10-CM | POA: Diagnosis not present

## 2016-07-30 DIAGNOSIS — Z794 Long term (current) use of insulin: Secondary | ICD-10-CM

## 2016-07-30 DIAGNOSIS — Z79899 Other long term (current) drug therapy: Secondary | ICD-10-CM

## 2016-07-30 LAB — BASIC METABOLIC PANEL
Anion gap: 13 (ref 5–15)
BUN: 10 mg/dL (ref 6–20)
CHLORIDE: 95 mmol/L — AB (ref 101–111)
CO2: 24 mmol/L (ref 22–32)
Calcium: 9.5 mg/dL (ref 8.9–10.3)
Creatinine, Ser: 0.82 mg/dL (ref 0.44–1.00)
GFR calc Af Amer: >60 mL/min (ref >60)
Glucose, Bld: 430 mg/dL — ABNORMAL HIGH (ref 65–99)
POTASSIUM: 4.7 mmol/L (ref 3.5–5.1)
Sodium: 132 mmol/L — ABNORMAL LOW (ref 135–145)

## 2016-07-30 LAB — CBC WITH DIFFERENTIAL/PLATELET
Basophils Absolute: 0.1 10*3/uL (ref 0–0.1)
Basophils Relative: 1 %
Eosinophils Absolute: 0 10*3/uL (ref 0–0.7)
Eosinophils Relative: 0 %
HEMATOCRIT: 42.2 % (ref 35.0–47.0)
HEMOGLOBIN: 14.6 g/dL (ref 12.0–16.0)
LYMPHS ABS: 0.9 10*3/uL — AB (ref 1.0–3.6)
Lymphocytes Relative: 8 %
MCH: 31.8 pg (ref 26.0–34.0)
MCHC: 34.5 g/dL (ref 32.0–36.0)
MCV: 92 fL (ref 80.0–100.0)
MONOS PCT: 7 %
Monocytes Absolute: 0.9 10*3/uL (ref 0.2–0.9)
NEUTROS ABS: 10.2 10*3/uL — AB (ref 1.4–6.5)
NEUTROS PCT: 84 %
Platelets: 361 10*3/uL (ref 150–440)
RBC: 4.59 MIL/uL (ref 3.80–5.20)
RDW: 13.2 % (ref 11.5–14.5)
WBC: 12.1 10*3/uL — ABNORMAL HIGH (ref 3.6–11.0)

## 2016-07-30 LAB — URINALYSIS COMPLETE WITH MICROSCOPIC (ARMC ONLY)
BACTERIA UA: NONE SEEN
BILIRUBIN URINE: NEGATIVE
NITRITE: NEGATIVE
Protein, ur: 100 mg/dL — AB
Specific Gravity, Urine: 1.024 (ref 1.005–1.030)
pH: 6 (ref 5.0–8.0)

## 2016-07-30 LAB — GLUCOSE, CAPILLARY
GLUCOSE-CAPILLARY: 394 mg/dL — AB (ref 65–99)
GLUCOSE-CAPILLARY: 304 mg/dL — AB (ref 65–99)
Glucose-Capillary: 292 mg/dL — ABNORMAL HIGH (ref 65–99)
Glucose-Capillary: 337 mg/dL — ABNORMAL HIGH (ref 65–99)

## 2016-07-30 LAB — LACTIC ACID, PLASMA: Lactic Acid, Venous: 1.7 mmol/L (ref 0.5–1.9)

## 2016-07-30 LAB — PREGNANCY, URINE: Preg Test, Ur: NEGATIVE

## 2016-07-30 MED ORDER — IBUPROFEN 400 MG PO TABS
400.0000 mg | ORAL_TABLET | Freq: Once | ORAL | Status: AC
  Administered 2016-07-30: 400 mg via ORAL

## 2016-07-30 MED ORDER — CEFTRIAXONE SODIUM 1 G IJ SOLR
INTRAMUSCULAR | Status: AC
  Administered 2016-07-30: 1 g via INTRAVENOUS
  Filled 2016-07-30: qty 10

## 2016-07-30 MED ORDER — INSULIN ASPART 100 UNIT/ML ~~LOC~~ SOLN
6.0000 [IU] | Freq: Once | SUBCUTANEOUS | Status: AC
  Administered 2016-07-30: 6 [IU] via INTRAVENOUS
  Filled 2016-07-30: qty 6

## 2016-07-30 MED ORDER — DIPHENHYDRAMINE HCL 25 MG PO CAPS: 50.0000 mg | ORAL_CAPSULE | Freq: Once | ORAL | Status: DC

## 2016-07-30 MED ORDER — SODIUM CHLORIDE 0.9 % IV BOLUS (SEPSIS)
1000.0000 mL | Freq: Once | INTRAVENOUS | Status: AC
  Administered 2016-07-30: 1000 mL via INTRAVENOUS

## 2016-07-30 MED ORDER — DEXTROSE 5 % IV SOLN
1.0000 g | Freq: Once | INTRAVENOUS | Status: AC
  Administered 2016-07-30: 1 g via INTRAVENOUS

## 2016-07-30 NOTE — ED Notes (Signed)
Patient transported to Ultrasound 

## 2016-07-30 NOTE — ED Notes (Signed)
MD at bedside plan to admitting to hospital.

## 2016-07-30 NOTE — ED Provider Notes (Signed)
[facility] Emergency Department Provider Note  ______________________________________   I have reviewed the triage vital signs and the nursing notes.   HISTORY  Chief Complaint Flank Pain   History limited by: Not Limited   HPI Emma Thomas is a 20 y.o. female who presents to the emergency department today because of concerns for left flank pain. It started last night. It has been constant since then. It is severe. She has had similar pain in the past and was recently admitted for pyelonephritis. She feels this is similar to that episode. She denies any recent dysuria, increased frequency of urination or bad odor to her urine. She did not appreciated any fevers at home. It does not sound like she is perfectly compliant with her insulin regimen. States that her sugars been over 300 but this is not unusual for her.   Past Medical History:  Diagnosis Date  . Diabetes mellitus without complication (HCC)    Type 1 DM  . Hypertension     Patient Active Problem List   Diagnosis Date Noted  . Sepsis ([place]) 06/08/2016  . DKA (diabetic ketoacidoses) ([place]) 09/09/2015    Past Surgical History:  Procedure Laterality Date  . abscess removal      Prior to Admission medications   Medication Sig Start Date End Date Taking? Authorizing Provider  HYDROcodone-acetaminophen (NORCO) 5-325 MG tablet Take 1 tablet by mouth every 8 (eight) hours as needed for moderate pain. 07/09/16   Frederich Cha, MD  insulin aspart (NOVOLOG) 100 UNIT/ML injection Inject 8 Units into the skin 3 (three) times daily before meals.    Historical Provider, MD  Insulin Detemir (LEVEMIR FLEXPEN) 100 UNIT/ML Pen Inject 15 Units into the skin 2 (two) times daily. 06/11/16   Loletha Grayer, MD  insulin lispro (HUMALOG) 100 UNIT/ML injection Inject 0.05 mLs (5 Units total) into the skin 3 (three) times daily before meals. 06/11/16   Loletha Grayer, MD  meloxicam (MOBIC) 15 MG tablet Take 1 tablet  (15 mg total) by mouth daily. Do not take with Motrin or Naprosyn 07/09/16   Frederich Cha, MD  moxifloxacin (VIGAMOX) 0.5 % ophthalmic solution Place 1 drop into the right eye 3 (three) times daily. 07/21/16   Norval Gable, MD    Allergies Review of patient's allergies indicates no known allergies.  Family History  Problem Relation Age of Onset  . Breast cancer Mother   . Diabetes type II Brother     Social History Social History  Substance Use Topics  . Smoking status: Never Smoker  . Smokeless tobacco: Never Used  . Alcohol use Yes    Review of Systems  Constitutional: Did not appreciate fever at home Cardiovascular: Negative for chest pain. Respiratory: Negative for shortness of breath. Gastrointestinal: Positive for left flank pain. Genitourinary: Negative for dysuria. Musculoskeletal: Negative for back pain. Skin: Negative for rash. Neurological: Negative for headaches, focal weakness or numbness.   10-point ROS otherwise negative.  ____________________________________________   PHYSICAL EXAM:  VITAL SIGNS: ED Triage Vitals [07/30/16 1607]  Enc Vitals Group     BP (!) 140/95     Pulse Rate (!) 128     Resp 20     Temp (!) 100.6 F (38.1 C)     Temp Source Oral     SpO2 100 %     Weight 150 lb (68 kg)     Height 5\' 6"  (1.676 m)   Constitutional: Alert and oriented. Appears somewhat uncomfortable. Eyes: Conjunctivae are  normal. Normal extraocular movements. ENT   Head: Normocephalic and atraumatic.   Nose: No congestion/rhinnorhea.   Mouth/Throat: Mucous membranes are moist.   Neck: No stridor. Hematological/Lymphatic/Immunilogical: No cervical lymphadenopathy. Cardiovascular: Tachycardic, regular rhythm.  No murmurs, rubs, or gallops. Respiratory: Normal respiratory effort without tachypnea nor retractions. Breath sounds are clear and equal bilaterally. No wheezes/rales/rhonchi. Gastrointestinal: Soft and nontender. Left sided cva  tenderness. Genitourinary: Deferred Musculoskeletal: Normal range of motion in all extremities. No lower extremity edema. Neurologic:  Normal speech and language. No gross focal neurologic deficits are appreciated.  Skin:  Skin is warm, dry and intact. No rash noted. Psychiatric: Mood and affect are normal. Speech and behavior are normal. Patient exhibits appropriate insight and judgment.  ____________________________________________    LABS (pertinent positives/negatives)  Labs Reviewed  URINALYSIS COMPLETEWITH MICROSCOPIC ([facility] ONLY) - Abnormal; Notable for the following:       Result Value   Color, Urine YELLOW (*)    APPearance CLOUDY (*)    Glucose, UA >500 (*)    Ketones, ur 2+ (*)    Hgb urine dipstick 2+ (*)    Protein, ur 100 (*)    Leukocytes, UA 3+ (*)    Squamous Epithelial / LPF 0-5 (*)    All other components within normal limits  CBC WITH DIFFERENTIAL/PLATELET - Abnormal; Notable for the following:    WBC 12.1 (*)    Neutro Abs 10.2 (*)    Lymphs Abs 0.9 (*)    All other components within normal limits  BASIC METABOLIC PANEL - Abnormal; Notable for the following:    Sodium 132 (*)    Chloride 95 (*)    Glucose, Bld 430 (*)    All other components within normal limits  GLUCOSE, CAPILLARY - Abnormal; Notable for the following:    Glucose-Capillary 394 (*)    All other components within normal limits  GLUCOSE, CAPILLARY - Abnormal; Notable for the following:    Glucose-Capillary 304 (*)    All other components within normal limits  GLUCOSE, CAPILLARY - Abnormal; Notable for the following:    Glucose-Capillary 292 (*)    All other components within normal limits  GLUCOSE, CAPILLARY - Abnormal; Notable for the following:    Glucose-Capillary 337 (*)    All other components within normal limits  PREGNANCY, URINE  LACTIC ACID, PLASMA  CBG MONITORING, ED      ____________________________________________   EKG  None  ____________________________________________    RADIOLOGY  US renal IMPRESSION:  Bladder wall thickening suggesting cystitis. Mild left  pelvicaliectasis, which in the setting of flank pain may reflect  pyelonephritis.    ____________________________________________   PROCEDURES  Procedures  ____________________________________________   INITIAL IMPRESSION / ASSESSMENT AND PLAN / ED COURSE  Pertinent labs & imaging results that were available during my care of the patient were reviewed by me and considered in my medical decision making (see chart for details).  Patient presents to the emergency department today with concerns for left flank pain. Workup is consistent with pyelonephritis. Patient was given multiple IV fluid boluses and antibiotics here in the emergency department. She remained tachycardic. Additionally her blood sugars remained elevated. Because of this will plan on admission in the hospital service for further workup and management. ____________________________________________   FINAL CLINICAL IMPRESSION(S) / ED DIAGNOSES  Final diagnoses:  Pyelonephritis     Note: This dictation was prepared with Dragon dictation. Any transcriptional errors that result from this process are unintentional    Nance Pear, MD 07/30/16  2251  

## 2016-07-30 NOTE — ED Triage Notes (Signed)
Reports left flank pain since last pm, today bodyaches and chills. States she had UTI last month and the sx were the same.

## 2016-07-31 DIAGNOSIS — E109 Type 1 diabetes mellitus without complications: Secondary | ICD-10-CM | POA: Diagnosis present

## 2016-07-31 DIAGNOSIS — A419 Sepsis, unspecified organism: Secondary | ICD-10-CM | POA: Diagnosis present

## 2016-07-31 DIAGNOSIS — Z79899 Other long term (current) drug therapy: Secondary | ICD-10-CM | POA: Diagnosis not present

## 2016-07-31 DIAGNOSIS — N12 Tubulo-interstitial nephritis, not specified as acute or chronic: Secondary | ICD-10-CM | POA: Diagnosis present

## 2016-07-31 DIAGNOSIS — Z833 Family history of diabetes mellitus: Secondary | ICD-10-CM | POA: Diagnosis not present

## 2016-07-31 DIAGNOSIS — N10 Acute tubulo-interstitial nephritis: Secondary | ICD-10-CM | POA: Diagnosis present

## 2016-07-31 DIAGNOSIS — Z803 Family history of malignant neoplasm of breast: Secondary | ICD-10-CM | POA: Diagnosis not present

## 2016-07-31 DIAGNOSIS — I10 Essential (primary) hypertension: Secondary | ICD-10-CM | POA: Diagnosis present

## 2016-07-31 DIAGNOSIS — Z794 Long term (current) use of insulin: Secondary | ICD-10-CM | POA: Diagnosis not present

## 2016-07-31 LAB — GLUCOSE, CAPILLARY
GLUCOSE-CAPILLARY: 291 mg/dL — AB (ref 65–99)
GLUCOSE-CAPILLARY: 256 mg/dL — AB (ref 65–99)
GLUCOSE-CAPILLARY: 282 mg/dL — AB (ref 65–99)
Glucose-Capillary: 337 mg/dL — ABNORMAL HIGH (ref 65–99)
Glucose-Capillary: 359 mg/dL — ABNORMAL HIGH (ref 65–99)

## 2016-07-31 LAB — BASIC METABOLIC PANEL
ANION GAP: 14 (ref 5–15)
BUN: 8 mg/dL (ref 6–20)
CALCIUM: 8.1 mg/dL — AB (ref 8.9–10.3)
CO2: 15 mmol/L — AB (ref 22–32)
Chloride: 103 mmol/L (ref 101–111)
Creatinine, Ser: 0.7 mg/dL (ref 0.44–1.00)
GFR calc Af Amer: >60 mL/min (ref >60)
GFR calc non Af Amer: >60 mL/min (ref >60)
GLUCOSE: 368 mg/dL — AB (ref 65–99)
Potassium: 4.6 mmol/L (ref 3.5–5.1)
Sodium: 132 mmol/L — ABNORMAL LOW (ref 135–145)

## 2016-07-31 LAB — CBC
HCT: 35.4 % (ref 35.0–47.0)
HEMOGLOBIN: 12 g/dL (ref 12.0–16.0)
MCH: 31.4 pg (ref 26.0–34.0)
MCHC: 33.8 g/dL (ref 32.0–36.0)
MCV: 92.8 fL (ref 80.0–100.0)
Platelets: 282 10*3/uL (ref 150–440)
RBC: 3.82 MIL/uL (ref 3.80–5.20)
RDW: 13.5 % (ref 11.5–14.5)
WBC: 11.5 10*3/uL — ABNORMAL HIGH (ref 3.6–11.0)

## 2016-07-31 MED ORDER — INSULIN ASPART 100 UNIT/ML ~~LOC~~ SOLN
0.0000 [IU] | Freq: Three times a day (TID) | SUBCUTANEOUS | Status: DC
  Administered 2016-07-31: 11 [IU] via SUBCUTANEOUS
  Filled 2016-07-31: qty 11

## 2016-07-31 MED ORDER — ONDANSETRON HCL 4 MG/2ML IJ SOLN
4.0000 mg | Freq: Four times a day (QID) | INTRAMUSCULAR | Status: DC | PRN
  Administered 2016-07-31 (×2): 4 mg via INTRAVENOUS
  Filled 2016-07-31 (×2): qty 2

## 2016-07-31 MED ORDER — INSULIN ASPART 100 UNIT/ML ~~LOC~~ SOLN
15.0000 [IU] | Freq: Once | SUBCUTANEOUS | Status: AC
  Administered 2016-07-31: 15 [IU] via INTRAVENOUS
  Filled 2016-07-31: qty 15

## 2016-07-31 MED ORDER — INSULIN ASPART 100 UNIT/ML ~~LOC~~ SOLN
5.0000 [IU] | Freq: Three times a day (TID) | SUBCUTANEOUS | Status: DC
  Administered 2016-07-31 – 2016-08-02 (×6): 5 [IU] via SUBCUTANEOUS
  Filled 2016-07-31 (×6): qty 5

## 2016-07-31 MED ORDER — MOXIFLOXACIN HCL 0.5 % OP SOLN
1.0000 [drp] | Freq: Three times a day (TID) | OPHTHALMIC | Status: DC
  Filled 2016-07-31: qty 3

## 2016-07-31 MED ORDER — BISACODYL 5 MG PO TBEC: 5.0000 mg | DELAYED_RELEASE_TABLET | Freq: Every day | ORAL | Status: DC | PRN

## 2016-07-31 MED ORDER — MAGNESIUM CITRATE PO SOLN: 1.0000 | Freq: Once | ORAL | Status: DC | PRN

## 2016-07-31 MED ORDER — ONDANSETRON HCL 4 MG PO TABS: 4.0000 mg | ORAL_TABLET | Freq: Four times a day (QID) | ORAL | Status: DC | PRN

## 2016-07-31 MED ORDER — ACETAMINOPHEN 650 MG RE SUPP
650.0000 mg | Freq: Four times a day (QID) | RECTAL | Status: DC | PRN
Start: 2016-07-31 — End: 2016-08-02

## 2016-07-31 MED ORDER — INSULIN GLARGINE 100 UNIT/ML ~~LOC~~ SOLN
24.0000 [IU] | Freq: Every day | SUBCUTANEOUS | Status: DC
  Administered 2016-07-31 – 2016-08-01 (×2): 24 [IU] via SUBCUTANEOUS
  Filled 2016-07-31 (×2): qty 0.24

## 2016-07-31 MED ORDER — IBUPROFEN 400 MG PO TABS
400.0000 mg | ORAL_TABLET | Freq: Four times a day (QID) | ORAL | Status: DC | PRN
  Administered 2016-07-31 – 2016-08-01 (×2): 400 mg via ORAL
  Filled 2016-07-31 (×2): qty 1

## 2016-07-31 MED ORDER — INSULIN ASPART 100 UNIT/ML ~~LOC~~ SOLN: 0.0000 [IU] | Freq: Every day | SUBCUTANEOUS | Status: DC

## 2016-07-31 MED ORDER — INSULIN ASPART 100 UNIT/ML ~~LOC~~ SOLN
0.0000 [IU] | Freq: Three times a day (TID) | SUBCUTANEOUS | Status: DC
  Administered 2016-07-31 – 2016-08-01 (×3): 5 [IU] via SUBCUTANEOUS
  Administered 2016-08-01: 3 [IU] via SUBCUTANEOUS
  Administered 2016-08-01: 2 [IU] via SUBCUTANEOUS
  Administered 2016-08-02: 5 [IU] via SUBCUTANEOUS
  Filled 2016-07-31 (×2): qty 5
  Filled 2016-07-31: qty 2
  Filled 2016-07-31: qty 5
  Filled 2016-07-31: qty 4
  Filled 2016-07-31: qty 5

## 2016-07-31 MED ORDER — MORPHINE SULFATE (PF) 2 MG/ML IV SOLN
1.0000 mg | INTRAVENOUS | Status: DC | PRN
  Administered 2016-07-31 – 2016-08-01 (×5): 1 mg via INTRAVENOUS
  Filled 2016-07-31 (×5): qty 1

## 2016-07-31 MED ORDER — INSULIN ASPART 100 UNIT/ML ~~LOC~~ SOLN
0.0000 [IU] | Freq: Every day | SUBCUTANEOUS | Status: DC
  Administered 2016-07-31 – 2016-08-01 (×2): 3 [IU] via SUBCUTANEOUS
  Filled 2016-07-31 (×2): qty 3

## 2016-07-31 MED ORDER — ACETAMINOPHEN 325 MG PO TABS
650.0000 mg | ORAL_TABLET | Freq: Four times a day (QID) | ORAL | Status: DC | PRN
  Administered 2016-07-31 – 2016-08-01 (×3): 650 mg via ORAL
  Filled 2016-07-31 (×3): qty 2

## 2016-07-31 MED ORDER — SENNOSIDES-DOCUSATE SODIUM 8.6-50 MG PO TABS: 1.0000 | ORAL_TABLET | Freq: Every evening | ORAL | Status: DC | PRN

## 2016-07-31 MED ORDER — DEXTROSE 5 % IV SOLN
1.0000 g | INTRAVENOUS | Status: DC
  Administered 2016-07-31 – 2016-08-01 (×2): 1 g via INTRAVENOUS
  Filled 2016-07-31 (×3): qty 10

## 2016-07-31 MED ORDER — SODIUM CHLORIDE 0.9 % IV SOLN
INTRAVENOUS | Status: DC
  Administered 2016-07-31 – 2016-08-02 (×6): via INTRAVENOUS

## 2016-07-31 MED ORDER — HYDROCODONE-ACETAMINOPHEN 5-325 MG PO TABS
1.0000 | ORAL_TABLET | Freq: Three times a day (TID) | ORAL | Status: DC | PRN
  Administered 2016-07-31 – 2016-08-01 (×3): 1 via ORAL
  Filled 2016-07-31 (×3): qty 1

## 2016-07-31 MED ORDER — MELOXICAM 7.5 MG PO TABS
15.0000 mg | ORAL_TABLET | Freq: Every day | ORAL | Status: DC
  Administered 2016-07-31: 15 mg via ORAL
  Filled 2016-07-31: qty 2

## 2016-07-31 NOTE — H&P (Signed)
[place] @ [facility] Admission History and Physical [employer], D.O.  ---------------------------------------------------------------------------------------------------------------------   PATIENT NAME: Emma Thomas MR#: [id number] DATE OF BIRTH: [birth year] DATE OF ADMISSION: 07/30/2016 PRIMARY CARE PHYSICIAN: [facility]  REQUESTING/REFERRING PHYSICIAN: ED Dr. Archie Balboa  CHIEF COMPLAINT: Chief Complaint  Patient presents with  . Flank Pain    HISTORY OF PRESENT ILLNESS: Emma Thomas is a 20 y.o. female with a known history of IDDM, Hypertension was in a usual state of health until Last night when she developed left-sided flank pain radiating around to her abdomen. She reports the pain is deep, aching and described as severe. She had developed fevers today along with some mild nausea. She states that her sugars have been poorly controlled recently in the 300s and that she does not check her blood sugars as often as she should.   Of note she was admitted to this hospital on 06/08/2016 with sepsis secondary to pyelonephritis and DKA.  Otherwise there has been no change in status. There has been no recent illness, travel or sick contacts.    Patient denies weakness, dizziness, chest pain, shortness of breath, N/V/C/D, abdominal pain, dysuria/frequency, changes in mental status.   EMS/ED COURSE:   Patient received Rocephin  PAST MEDICAL HISTORY: Past Medical History:  Diagnosis Date  . Diabetes mellitus without complication (HCC)    Type 1 DM  . Hypertension       PAST SURGICAL HISTORY: Past Surgical History:  Procedure Laterality Date  . abscess removal        SOCIAL HISTORY: Social History  Substance Use Topics  . Smoking status: Never Smoker  . Smokeless tobacco: Never Used  . Alcohol use Yes      FAMILY HISTORY: Family History  Problem Relation Age of Onset  . Breast cancer Mother   . Diabetes type II Brother       MEDICATIONS AT HOME: Prior to Admission medications   Medication Sig Start Date End Date Taking? Authorizing Provider  HYDROcodone-acetaminophen (NORCO) 5-325 MG tablet Take 1 tablet by mouth every 8 (eight) hours as needed for moderate pain. 07/09/16  Yes Frederich Cha, MD  Insulin Detemir (LEVEMIR FLEXPEN) 100 UNIT/ML Pen Inject 15 Units into the skin 2 (two) times daily. Patient taking differently: Inject 16 Units into the skin 2 (two) times daily.  06/11/16  Yes Richard Leslye Peer, MD  insulin lispro (HUMALOG) 100 UNIT/ML injection Inject 8 Units into the skin 3 (three) times daily before meals.   Yes Historical Provider, MD  Levonorgestrel (SKYLA IU) 1 Device by Intrauterine route.   Yes Historical Provider, MD  meloxicam (MOBIC) 15 MG tablet Take 1 tablet (15 mg total) by mouth daily. Do not take with Motrin or Naprosyn 07/09/16  Yes Frederich Cha, MD  moxifloxacin (VIGAMOX) 0.5 % ophthalmic solution Place 1 drop into the right eye 3 (three) times daily. 07/21/16  Yes Norval Gable, MD      DRUG ALLERGIES: No Known Allergies   REVIEW OF SYSTEMS: CONSTITUTIONAL: Positive fever. Negative fatigue, weakness, weight gain/loss, headache EYES: No blurry or double vision. ENT: No tinnitus, postnasal drip, redness or soreness of the oropharynx. RESPIRATORY: No cough, wheeze, hemoptysis, dyspnea. CARDIOVASCULAR: No chest pain, orthopnea, palpitations, syncope. GASTROINTESTINAL: No nausea, vomiting, constipation, diarrhea, abdominal pain, hematemesis, melena or hematochezia. GENITOURINARY: No dysuria or hematuria. Positive left flank pain ENDOCRINE: No polyuria or nocturia. No heat or cold intolerance. HEMATOLOGY: No anemia, bruising, bleeding. INTEGUMENTARY: No rashes, ulcers, lesions. MUSCULOSKELETAL: No arthritis, swelling, gout.  NEUROLOGIC: No numbness, tingling, weakness or ataxia. No seizure-type activity. PSYCHIATRIC: No anxiety, depression, insomnia.  PHYSICAL EXAMINATION: VITAL  SIGNS: Blood pressure (!) 148/102, pulse (!) 113, temperature 99.1 F (37.3 C), resp. rate 18, height 5\' 6"  (1.676 m), weight 68 kg (150 lb), SpO2 100 %.  GENERAL: 20 y.o.-year-old black female patient, well-developed, well-nourished lying in the bed in no acute distress.  Pleasant and cooperative.   HEENT: Head atraumatic, normocephalic. Pupils equal, round, reactive to light and accommodation. No scleral icterus. Extraocular muscles intact. Nares are patent. Oropharynx is clear. Mucus membranes moist. NECK: Supple, full range of motion. No JVD, no bruit heard. No thyroid enlargement, no tenderness, no cervical lymphadenopathy. CHEST: Normal breath sounds bilaterally. No wheezing, rales, rhonchi or crackles. No use of accessory muscles of respiration.  No reproducible chest wall tenderness.  CARDIOVASCULAR: S1, S2 normal. No murmurs, rubs, or gallops. Cap refill <2 seconds. ABDOMEN: Soft, nondistended. No rebound, guarding, rigidity. Normoactive bowel sounds present in all four quadrants. No organomegaly or mass. Positive left-sided CVA tenderness. Positive tenderness to the left upper quadrant. EXTREMITIES: Full range of motion. No pedal edema, cyanosis, or clubbing. NEUROLOGIC: Cranial nerves II through XII are grossly intact with no focal sensorimotor deficit. Muscle strength 5/5 in all extremities. Sensation intact. Gait not checked. PSYCHIATRIC: The patient is alert and oriented x 3. Normal affect, mood, thought content. SKIN: Warm, dry, and intact without obvious rash, lesion, or ulcer.  LABORATORY PANEL:  CBC  Recent Labs Lab 07/30/16 1716  WBC 12.1*  HGB 14.6  HCT 42.2  PLT 361   ----------------------------------------------------------------------------------------------------------------- Chemistries  Recent Labs Lab 07/30/16 1716  NA 132*  K 4.7  CL 95*  CO2 24  GLUCOSE 430*  BUN 10  CREATININE 0.82  CALCIUM 9.5    ------------------------------------------------------------------------------------------------------------------ Cardiac Enzymes No results for input(s): TROPONINI in the last 168 hours. ------------------------------------------------------------------------------------------------------------------  RADIOLOGY: US Renal  Result Date: 07/30/2016 CLINICAL DATA:  Left flank pain. EXAM: RENAL / URINARY TRACT ULTRASOUND COMPLETE COMPARISON:  None. FINDINGS: Right Kidney: Length: 12.3 cm. Echogenicity within normal limits. No mass or hydronephrosis visualized. Left Kidney: Length: 13.3 cm. Mild pelvic caliectasis. Echogenicity within normal limits. No mass visualized. No perirenal or intrarenal fluid collection. Bladder: Nondependent bladder wall appears thickened measuring 7 mm. Both ureteral jets are seen. IMPRESSION: Bladder wall thickening suggesting cystitis. Mild left pelvicaliectasis, which in the setting of flank pain may reflect pyelonephritis. Electronically Signed   By: Jeb Levering M.D.   On: 07/30/2016 20:59    IMPRESSION AND PLAN:  This is a 20 y.o. female with a history of insulin-dependent diabetes, hypertension now being admitted with: 1. Sepsis secondary to pyelonephritis-we'll admit the patient for IV antibiotics with Rocephin, IV fluids, urine and blood cultures. 2. History of insulin-dependent diabetes-no evidence of DKA at this time. Take glucose control with insulin sliding scale coverage and will check hemoglobin A1c 3. Hypertension-patient not currently taking any medication for elevated blood pressure. We'll monitor.   Diet/Nutrition: Carb controlled Fluids: IV normal saline DVT Px:  SCDs and early ambulation Code Status: Full  All the records are reviewed and case discussed with ED provider. Management plans discussed with the patient and/or family who express understanding and agree with plan of care.   TOTAL TIME TAKING CARE OF THIS PATIENT: 60  minutes.   Jazyah Butsch D.O. on 07/31/2016 at 12:06 AM Between 7am to 6pm - Pager - [phone] After 6pm go to [email or web address] - [facility] Office  [phone] CC: Primary care physician; [facility]     Note: This dictation was prepared with Dragon dictation along with smaller phrase technology. Any transcriptional errors that result from this process are unintentional.

## 2016-07-31 NOTE — ED Notes (Signed)
Dr. Archie Balboa aware of CBG

## 2016-07-31 NOTE — Progress Notes (Signed)
Pharmacy Antibiotic Note  Emma Thomas is a 20 y.o. female admitted on 07/30/2016 with UTI.  Pharmacy has been consulted for ceftriaxone dosing.  Plan: Ceftriaxone 1 gm IV Q24H  Height: 5\' 6"  (167.6 cm) Weight: 150 lb (68 kg) IBW/kg (Calculated) : 59.3  Temp (24hrs), Avg:100.5 F (38.1 C), Min:99.1 F (37.3 C), Max:103 F (39.4 C)   Recent Labs Lab 07/30/16 1716  WBC 12.1*  CREATININE 0.82  LATICACIDVEN 1.7    Estimated Creatinine Clearance: 101.6 mL/min (by C-G formula based on SCr of 0.82 mg/dL).    No Known Allergies   Thank you for allowing pharmacy to be a part of this patient's care.  Laural Benes, Pharm.D., BCPS Clinical Pharmacist 07/31/2016 1:25 AM

## 2016-07-31 NOTE — Progress Notes (Signed)
Paged Dr Bridgett Larsson, notified Dr of pt fever still 103.2, that fever had not come down since tylenol given

## 2016-07-31 NOTE — Progress Notes (Signed)
Inpatient Diabetes Program Recommendations  AACE/ADA: New Consensus Statement on Inpatient Glycemic Control (2015)  Target Ranges:  Prepandial:   less than 140 mg/dL      Peak postprandial:   less than 180 mg/dL (1-2 hours)      Critically ill patients:  140 - 180 mg/dL  Results for ASHIAH, Emma Thomas (MRN [id number]) as of 07/31/2016 10:29  Ref. Range 07/30/2016 19:23 07/30/2016 21:13 07/30/2016 21:47 07/30/2016 22:43 07/31/2016 00:39 07/31/2016 07:36  Glucose-Capillary Latest Ref Range: 65 - 99 mg/dL 394 (H) 304 (H) 292 (H) 337 (H) 359 (H) 337 (H)  Results for EADIE, Emma Thomas (MRN [id number]) as of 07/31/2016 10:29  Ref. Range 07/31/2016 05:31  CO2 Latest Ref Range: 22 - 32 mmol/L 15 (L)  Results for DEZIREE, Emma Thomas (MRN [id number]) as of 07/31/2016 10:29  Ref. Range 07/31/2016 05:31  Anion gap Latest Ref Range: 5 - 15  14  Results for AMIT, Emma Thomas (MRN [id number]) as of 07/31/2016 10:29  Ref. Range 07/31/2016 05:31  Glucose Latest Ref Range: 65 - 99 mg/dL 368 (H)   Results for SHALLA, Emma Thomas (MRN [id number]) as of 07/31/2016 10:29  Ref. Range 06/10/2016 04:25  Hemoglobin A1C Latest Ref Range: 4.0 - 6.0 % 14.0 (H)   Review of Glycemic Control  Diabetes history: DM1 Outpatient Diabetes medications: Levemir 16 units BID, Humalog 8 units TID with meals Current orders for Inpatient glycemic control: Novolog 0-15 units TID with meals, Novolog 0-5 units QHS  Inpatient Diabetes Program Recommendations: IV Insulin drip/GlucoStabilizer: No basal insulin given since admitted. Patient received Novolog 11 units today at 8:34 am for correction (glucose 337 mg/dl). Patient has Type 1 diabetes and requires basal, correction, and carb coverage insulin. Labs at 5:31 am today resulted with CO2 15, AG 14 and glucose 368 mg/dl which are indicative of DKA. Please order stat BMET and if labs indicate DKA, patient will need to be transferred to ICU/Stepdown to be placed on IV insulin per DKA order set Insulin -  Basal: If current BMET does not indicate DKA, please order Lantus 24 units Q24H (based on 68 kg x 0.35 units) start ASAP after BMET resulted. Correction (SSI): If current BMET does not indicate DKA, please decrease Novolog correction to sensitive correction scale. Insulin - Meal Coverage: If current BMET does not indicate DKA, please order Novolog 5 units TID with meals for meal coverage. HgbA1C: A1C 14.0% on 06/10/16. Patient was seen by Diabetes Coordinator on 06/10/16 while inpatient and advised to follow up with Dr. Graceann Thomas (Endocrinologist).  Thanks, Emma Alderman, RN, MSN, CDE Diabetes Coordinator Inpatient Diabetes Program [phone] (Team Pager from [place] to [place]) [phone] ([facility] office) [phone] [facility] office) [phone] [facility] office)

## 2016-07-31 NOTE — Progress Notes (Signed)
Notified Dr Bridgett Larsson of pt tep of 103.1; Dr acknowledged, no new orders

## 2016-07-31 NOTE — ED Notes (Addendum)
Insulin given for BS. Pt given IV pain and nasuea meds. Pt states nausea and pain 9/10 to left flank prior to meds. Pain coming down now.  Pt states cold. Temp 100.8.

## 2016-07-31 NOTE — ED Notes (Signed)
DR. Ara Kussmaul paged in regards to elevated BS, HR, and Temp. New orders to give Insl[place] one time. MD aware of Temp and HR.

## 2016-07-31 NOTE — Progress Notes (Signed)
[place] at [place] NAME: Emma Thomas    MR#:  [id number]  DATE OF BIRTH:  [birth year]  SUBJECTIVE:  CHIEF COMPLAINT:   Chief Complaint  Patient presents with  . Flank Pain   Left flank pain, fever 102 this am, weakness. REVIEW OF SYSTEMS:  Review of Systems  Constitutional: Positive for chills, fever and malaise/fatigue.  Eyes: Negative for blurred vision.  Respiratory: Negative for cough, shortness of breath, wheezing and stridor.   Cardiovascular: Negative for chest pain, palpitations and leg swelling.  Gastrointestinal: Positive for abdominal pain and nausea. Negative for blood in stool, diarrhea, melena and vomiting.  Genitourinary: Positive for flank pain and frequency. Negative for dysuria, hematuria and urgency.  Musculoskeletal: Negative for joint pain.  Skin: Negative for rash.  Neurological: Positive for dizziness and headaches. Negative for focal weakness and loss of consciousness.  Psychiatric/Behavioral: Negative for depression.    DRUG ALLERGIES:  No Known Allergies VITALS:  Blood pressure 115/77, pulse (!) 105, temperature 99.1 F (37.3 C), temperature source Oral, resp. rate 17, height 5\' 6"  (1.676 m), weight 150 lb (68 kg), SpO2 100 %. PHYSICAL EXAMINATION:  Physical Exam  Constitutional: She is oriented to person, place, and time and well-developed, well-nourished, and in no distress.  lethargic  HENT:  Head: Normocephalic.  Mouth/Throat: Oropharynx is clear and moist.  Eyes: Conjunctivae and EOM are normal. No scleral icterus.  Neck: Normal range of motion. Neck supple. No JVD present. No thyromegaly present.  Cardiovascular: Normal rate, regular rhythm and normal heart sounds.  Exam reveals no gallop.   No murmur heard. Pulmonary/Chest: Effort normal and breath sounds normal. No respiratory distress. She has no wheezes. She has no rales.  Abdominal: Soft. Bowel sounds are normal. She exhibits no  distension. There is tenderness. There is no rebound and no guarding.  No CVA tenderness but has tenderness on left side of abdomen.  Musculoskeletal: Normal range of motion. She exhibits no edema.  Lymphadenopathy:    She has no cervical adenopathy.  Neurological: She is alert and oriented to person, place, and time. No cranial nerve deficit.  Skin: Skin is warm. No rash noted. No erythema.  Psychiatric: Affect normal.   LABORATORY PANEL:   CBC  Recent Labs Lab 07/31/16 0531  WBC 11.5*  HGB 12.0  HCT 35.4  PLT 282   ------------------------------------------------------------------------------------------------------------------ Chemistries   Recent Labs Lab 07/31/16 0531  NA 132*  K 4.6  CL 103  CO2 15*  GLUCOSE 368*  BUN 8  CREATININE 0.70  CALCIUM 8.1*   RADIOLOGY:  US Renal  Result Date: 07/30/2016 CLINICAL DATA:  Left flank pain. EXAM: RENAL / URINARY TRACT ULTRASOUND COMPLETE COMPARISON:  None. FINDINGS: Right Kidney: Length: 12.3 cm. Echogenicity within normal limits. No mass or hydronephrosis visualized. Left Kidney: Length: 13.3 cm. Mild pelvic caliectasis. Echogenicity within normal limits. No mass visualized. No perirenal or intrarenal fluid collection. Bladder: Nondependent bladder wall appears thickened measuring 7 mm. Both ureteral jets are seen. IMPRESSION: Bladder wall thickening suggesting cystitis. Mild left pelvicaliectasis, which in the setting of flank pain may reflect pyelonephritis. Electronically Signed   By: Jeb Levering M.D.   On: 07/30/2016 20:59   ASSESSMENT AND PLAN:   1. Sepsis secondary to pyelonephritis Continue Rocephin, IV fluids, follow-up urine and blood cultures.  2. History of insulin-dependent diabetes with hyperglycemia  Start on Lantus 24 units subcutaneous at bedtime, NovoLog 5 units before meals meals, continue insulin sliding scale, hemoglobin  A1c.   3. Hypertension. Controlled without medication.  All the records are  reviewed and case discussed with Care Management/Social Worker. Management plans discussed with the patient, family and they are in agreement.  CODE STATUS: full code.  TOTAL TIME TAKING CARE OF THIS PATIENT: 39 minutes.   More than 50% of the time was spent in counseling/coordination of care: YES  POSSIBLE D/C IN 2-3 DAYS, DEPENDING ON CLINICAL CONDITION.   Demetrios Loll M.D on 07/31/2016 at 2:00 PM  Between 7am to 6pm - Pager - [phone]  After 6pm go to [email or web address] - Proofreader  [facility]  Office  [phone]  CC: Primary care physician; [facility]  Note: This dictation was prepared with Dragon dictation along with smaller phrase technology. Any transcriptional errors that result from this process are unintentional.

## 2016-08-01 LAB — BASIC METABOLIC PANEL
Anion gap: 9 (ref 5–15)
BUN: 11 mg/dL (ref 6–20)
CO2: 20 mmol/L — ABNORMAL LOW (ref 22–32)
CREATININE: 0.53 mg/dL (ref 0.44–1.00)
Calcium: 8 mg/dL — ABNORMAL LOW (ref 8.9–10.3)
Chloride: 104 mmol/L (ref 101–111)
GFR calc Af Amer: >60 mL/min (ref >60)
GFR calc non Af Amer: >60 mL/min (ref >60)
GLUCOSE: 300 mg/dL — AB (ref 65–99)
POTASSIUM: 4.2 mmol/L (ref 3.5–5.1)
SODIUM: 133 mmol/L — AB (ref 135–145)

## 2016-08-01 LAB — GLUCOSE, CAPILLARY
GLUCOSE-CAPILLARY: 198 mg/dL — AB (ref 65–99)
GLUCOSE-CAPILLARY: 265 mg/dL — AB (ref 65–99)
Glucose-Capillary: 281 mg/dL — ABNORMAL HIGH (ref 65–99)
Glucose-Capillary: 206 mg/dL — ABNORMAL HIGH (ref 65–99)

## 2016-08-01 LAB — URINE CULTURE

## 2016-08-01 MED ORDER — HYDROCODONE-ACETAMINOPHEN 5-325 MG PO TABS
1.0000 | ORAL_TABLET | Freq: Three times a day (TID) | ORAL | Status: DC | PRN
  Administered 2016-08-01: 2 via ORAL
  Administered 2016-08-01: 1 via ORAL
  Administered 2016-08-02: 2 via ORAL
  Filled 2016-08-01 (×2): qty 2
  Filled 2016-08-01: qty 1
  Filled 2016-08-01: qty 2

## 2016-08-01 MED ORDER — BUTALBITAL-APAP-CAFFEINE 50-325-40 MG PO TABS
1.0000 | ORAL_TABLET | Freq: Once | ORAL | Status: AC
  Administered 2016-08-01: 1 via ORAL
  Filled 2016-08-01: qty 1

## 2016-08-01 MED ORDER — INSULIN GLARGINE 100 UNIT/ML ~~LOC~~ SOLN
26.0000 [IU] | Freq: Every day | SUBCUTANEOUS | Status: DC
  Administered 2016-08-02: 26 [IU] via SUBCUTANEOUS
  Filled 2016-08-01: qty 0.26

## 2016-08-01 NOTE — Progress Notes (Signed)
[place] at [place] NAME: Emma Thomas    MR#:  [id number]  DATE OF BIRTH:  06-06-1996  SUBJECTIVE: Patient feels better. No further fever. Still has left flank pain.   CHIEF COMPLAINT:   Chief Complaint  Patient presents with  . Flank Pain   Left flank pain, fever 102 this am, weakness. REVIEW OF SYSTEMS:  Review of Systems  Constitutional: Negative for chills, fever and malaise/fatigue.  Eyes: Negative for blurred vision.  Respiratory: Negative for cough, shortness of breath, wheezing and stridor.   Cardiovascular: Negative for chest pain, palpitations and leg swelling.  Gastrointestinal: Negative for blood in stool, diarrhea, melena, nausea and vomiting.  Genitourinary: Positive for flank pain and frequency. Negative for dysuria, hematuria and urgency.  Musculoskeletal: Negative for joint pain.  Skin: Negative for rash.  Neurological: Positive for dizziness and headaches. Negative for focal weakness and loss of consciousness.  Psychiatric/Behavioral: Negative for depression.    DRUG ALLERGIES:  No Known Allergies VITALS:  Blood pressure 120/70, pulse 88, temperature 98.5 F (36.9 C), temperature source Oral, resp. rate 18, height 5\' 6"  (1.676 m), weight 68 kg (150 lb), SpO2 100 %. PHYSICAL EXAMINATION:  Physical Exam  Constitutional: She is oriented to person, place, and time and well-developed, well-nourished, and in no distress.  lethargic  HENT:  Head: Normocephalic.  Mouth/Throat: Oropharynx is clear and moist.  Eyes: Conjunctivae and EOM are normal. No scleral icterus.  Neck: Normal range of motion. Neck supple. No JVD present. No thyromegaly present.  Cardiovascular: Normal rate, regular rhythm and normal heart sounds.  Exam reveals no gallop.   No murmur heard. Pulmonary/Chest: Effort normal and breath sounds normal. No respiratory distress. She has no wheezes. She has no rales.  Abdominal: Soft. Bowel sounds are  normal. She exhibits no distension. There is tenderness. There is no rebound and no guarding.  No CVA tenderness but has tenderness on left side of abdomen.  Musculoskeletal: Normal range of motion. She exhibits no edema.  Lymphadenopathy:    She has no cervical adenopathy.  Neurological: She is alert and oriented to person, place, and time. No cranial nerve deficit.  Skin: Skin is warm. No rash noted. No erythema.  Psychiatric: Affect normal.   LABORATORY PANEL:   CBC  Recent Labs Lab 07/31/16 0531  WBC 11.5*  HGB 12.0  HCT 35.4  PLT 282   ------------------------------------------------------------------------------------------------------------------ Chemistries   Recent Labs Lab 08/01/16 0639  NA 133*  K 4.2  CL 104  CO2 20*  GLUCOSE 300*  BUN 11  CREATININE 0.53  CALCIUM 8.0*   RADIOLOGY:  No results found. ASSESSMENT AND PLAN:   1. Sepsis secondary to pyelonephritis Continue Rocephin, IV fluids, Cultures, urine cultures are negative to date. Likely discharge tomorrow with by mouth Cipro. Continue IV hydration today also. \ 2. History of insulin-dependent diabetes with hyperglycemia  increase on Lantus 26units subcutaneous at bedtime, NovoLog 5 units before meals meals, continue insulin sliding scale,   3. Hypertension. Controlled without medication.  All the records are reviewed and case discussed with Care Management/Social Worker. Management plans discussed with the patient, family and they are in agreement.  CODE STATUS: full code.  TOTAL TIME TAKING CARE OF THIS PATIENT: 39 minutes.   More than 50% of the time was spent in counseling/coordination of care: YES  POSSIBLE D/C IN 2-3 DAYS, DEPENDING ON CLINICAL CONDITION.   Epifanio Lesches M.D on 08/01/2016 at 12:05 PM  Between 7am to 6pm -  Pager - [phone]  After 6pm go to [email or web address] - Proofreader  [facility] [place] [facility]  Office  [phone]  CC: Primary care  physician; [facility]  Note: This dictation was prepared with Dragon dictation along with smaller phrase technology. Any transcriptional errors that result from this process are unintentional.

## 2016-08-02 LAB — GLUCOSE, CAPILLARY
GLUCOSE-CAPILLARY: 271 mg/dL — AB (ref 65–99)
GLUCOSE-CAPILLARY: 267 mg/dL — AB (ref 65–99)

## 2016-08-02 LAB — HEMOGLOBIN A1C
Hgb A1c MFr Bld: 15.2 % — ABNORMAL HIGH (ref 4.8–5.6)
Mean Plasma Glucose: 390 mg/dL

## 2016-08-02 MED ORDER — NITROFURANTOIN MONOHYD MACRO 100 MG PO CAPS: 100.0000 mg | ORAL_CAPSULE | Freq: Two times a day (BID) | ORAL | 0 refills | Status: DC

## 2016-08-02 MED ORDER — HYDROCODONE-ACETAMINOPHEN 5-325 MG PO TABS: 1.0000 | ORAL_TABLET | Freq: Three times a day (TID) | ORAL | 0 refills | Status: DC | PRN

## 2016-08-02 MED ORDER — INFLUENZA VAC SPLIT QUAD 0.5 ML IM SUSY
0.5000 mL | PREFILLED_SYRINGE | INTRAMUSCULAR | Status: DC
Start: 2016-08-03 — End: 2016-08-02

## 2016-08-02 MED ORDER — INSULIN GLARGINE 100 UNIT/ML ~~LOC~~ SOLN: 26.0000 [IU] | Freq: Every day | SUBCUTANEOUS | 11 refills | Status: DC

## 2016-08-02 NOTE — Progress Notes (Signed)
Patient discharged to home as ordered. Patient given prescription for Norco as ordered. Patient is alert and oriented, no acute distress noted, ambulates without assistance. Patient instructed to follow up with medical doctor in 3 days. Back to work note given to patient upon discharge.

## 2016-08-02 NOTE — Discharge Summary (Signed)
Emma Thomas, is a 20 y.o. female  DOB Jun 20, 1996  MRN [id number].  Admission date:  07/30/2016  Admitting Physician  Harvie Bridge, DO  Discharge Date:  08/02/2016   Primary MD  [facility]  Recommendations for primary care physician for things to follow:    follow Up with primary doctor in 2-3 days   Admission Diagnosis  Pyelonephritis [N12]   Discharge Diagnosis  Pyelonephritis [N12]    Active Problems:   Pyelonephritis      Past Medical History:  Diagnosis Date  . Diabetes mellitus without complication (HCC)    Type 1 DM  . Hypertension     Past Surgical History:  Procedure Laterality Date  . abscess removal         History of present illness and  Hospital Course:     Kindly see H&P for history of present illness and admission details, please review complete Labs, Consult reports and Test reports for all details in brief  HPI  from the history and physical done on the day of admission 20 year old female patient with history of essential hypertension, diabetes mellitus type 2 comes in because of left flank pain, some nausea and chills. Blood sugar when she became more than 300.   Hospital Course  Admitted for sepsis secondary to acute pyelonephritis: Started on IV Rocephin, IV fluids. WBC on admission 12.1. Patient to urine culture did not show any growth, blood culture did not show growth. Fever when she came stated to 103.2 on September 15. After that patient has been afebrile for 2 days. Discharging her today with Macrodantin 100 mg twice a day for 7 days. Patient has no dysuria, no chills. Tolerating the diet. #2 headache, possible migraines: Patient is to see her primary doctor regarding migraine management. Gave a limited supply of Norco, 10 tablets only. Suspect possible  narcotic seeking behavior also. #3 diabetes mellitus type 1: Blood sugar when she came more than 300 seen by diabetes coordinator, not in DKA, iron gap 13 on admission with normal renal function. Patient hemoglobin A1c was 14 in July. Diabetes coordinator recommended Lantus 24 units every 24 hours based on her weigh,novolog 5 units 3 times a day with meals for meal coverage, patient can see her endocrinologist Dr. Graceann Congress  Discharge Condition:    Follow UP  Follow-up Information    [facility] Follow up in 3 day(s).   Contact information: [place] [phone]             Discharge Instructions  and  Discharge Medications        Medication List    STOP taking these medications   Insulin Detemir 100 UNIT/ML Pen Commonly known as:  LEVEMIR FLEXPEN   moxifloxacin 0.5 % ophthalmic solution Commonly known as:  VIGAMOX     TAKE these medications   HYDROcodone-acetaminophen 5-325 MG tablet Commonly known as:  NORCO Take 1 tablet by mouth every 8 (eight) hours as needed for moderate pain.   insulin glargine 100 UNIT/ML injection Commonly known as:  LANTUS Inject 0.26 mLs (26 Units total) into the skin daily.   insulin lispro 100 UNIT/ML injection Commonly known as:  HUMALOG Inject 8 Units into the skin 3 (three) times daily before meals.   meloxicam 15 MG tablet Commonly known as:  MOBIC Take 1 tablet (15 mg total) by mouth daily. Do not take with Motrin or Naprosyn   nitrofurantoin (macrocrystal-monohydrate) 100 MG capsule Commonly  known as:  MACROBID Take 1 capsule (100 mg total) by mouth 2 (two) times daily.   SKYLA IU 1 Device by Intrauterine route.         Diet and Activity recommendation: See Discharge Instructions above Diabetic diet  Consults obtained -Diabetic coordinator   Major procedures and Radiology Reports - PLEASE review detailed and final reports for all details, in brief -      US  Renal  Result Date: 07/30/2016 CLINICAL DATA:  Left flank pain. EXAM: RENAL / URINARY TRACT ULTRASOUND COMPLETE COMPARISON:  None. FINDINGS: Right Kidney: Length: 12.3 cm. Echogenicity within normal limits. No mass or hydronephrosis visualized. Left Kidney: Length: 13.3 cm. Mild pelvic caliectasis. Echogenicity within normal limits. No mass visualized. No perirenal or intrarenal fluid collection. Bladder: Nondependent bladder wall appears thickened measuring 7 mm. Both ureteral jets are seen. IMPRESSION: Bladder wall thickening suggesting cystitis. Mild left pelvicaliectasis, which in the setting of flank pain may reflect pyelonephritis. Electronically Signed   By: Jeb Levering M.D.   On: 07/30/2016 20:59    Micro Results    Recent Results (from the past 240 hour(s))  Urine culture     Status: Abnormal   Collection Time: 07/31/16  5:10 AM  Result Value Ref Range Status   Specimen Description URINE, RANDOM  Final   Special Requests NONE  Final   Culture MULTIPLE SPECIES PRESENT, SUGGEST RECOLLECTION (A)  Final   Report Status 08/01/2016 FINAL  Final  CULTURE, BLOOD (ROUTINE X 2) w Reflex to ID Panel     Status: None (Preliminary result)   Collection Time: 07/31/16  4:07 PM  Result Value Ref Range Status   Specimen Description BLOOD  RIGHT HAND  Final   Special Requests BOTTLES DRAWN AEROBIC AND ANAEROBIC AER3CC ANA 1CC  Final   Culture NO GROWTH 2 DAYS  Final   Report Status PENDING  Incomplete  CULTURE, BLOOD (ROUTINE X 2) w Reflex to ID Panel     Status: None (Preliminary result)   Collection Time: 07/31/16  4:07 PM  Result Value Ref Range Status   Specimen Description BLOOD LEFT AC  Final   Special Requests   Final    BOTTLES DRAWN AEROBIC AND ANAEROBIC  AER 10CC ANA6CC   Culture NO GROWTH 2 DAYS  Final   Report Status PENDING  Incomplete       Today   Subjective:   Emma Thomas today has no headache,no chest abdominal pain,no new weakness tingling or numbness, feels  much better wants to go home today.   Objective:   Blood pressure 118/80, pulse 89, temperature 97.9 F (36.6 C), temperature source Oral, resp. rate 17, height 5\' 6"  (1.676 m), weight 68 kg (150 lb), SpO2 99 %.   Intake/Output Summary (Last 24 hours) at 08/02/16 1048 Last data filed at 08/02/16 0713  Gross per 24 hour  Intake          3188.34 ml  Output             2650 ml  Net           538.34 ml    Exam Awake Alert, Oriented x 3, No new F.N deficits, Normal affect [place].AT,PERRAL Supple Neck,No JVD, No cervical lymphadenopathy appriciated.  Symmetrical Chest wall movement, Good air movement bilaterally, CTAB RRR,No Gallops,Rubs or new Murmurs, No Parasternal Heave +ve B.Sounds, Abd Soft, Non tender, No organomegaly appriciated, No rebound -guarding or rigidity. No Cyanosis, Clubbing or edema, No new Rash or bruise  Data Review   CBC w Diff: Lab Results  Component Value Date   WBC 11.5 (H) 07/31/2016   HGB 12.0 07/31/2016   HCT 35.4 07/31/2016   PLT 282 07/31/2016   LYMPHOPCT 8 07/30/2016   MONOPCT 7 07/30/2016   EOSPCT 0 07/30/2016   BASOPCT 1 07/30/2016    CMP: Lab Results  Component Value Date   NA 133 (L) 08/01/2016   K 4.2 08/01/2016   CL 104 08/01/2016   CO2 20 (L) 08/01/2016   BUN 11 08/01/2016   CREATININE 0.53 08/01/2016   PROT 8.1 06/08/2016   ALBUMIN 3.4 (L) 06/08/2016   BILITOT 1.9 (H) 06/08/2016   ALKPHOS 146 (H) 06/08/2016   AST 20 06/08/2016   ALT 11 (L) 06/08/2016  .   Total Time in preparing paper work, data evaluation and todays exam - 90 minutes  Emma Thomas M.D on 08/02/2016 at 10:48 AM    Note: This dictation was prepared with Dragon dictation along with smaller phrase technology. Any transcriptional errors that result from this process are unintentional.

## 2016-08-02 NOTE — Progress Notes (Signed)
     Emma Thomas was admitted to the Hospital on 07/30/2016 and Discharged  08/02/2016 and should be excused from work/school   for 5 days starting 07/30/2016 , may return to work/school without any restrictions.    Epifanio Lesches M.D on 08/02/2016,at 12:50 PM

## 2016-08-05 LAB — CULTURE, BLOOD (ROUTINE X 2)
CULTURE: NO GROWTH
Culture: NO GROWTH

## 2016-08-18 LAB — CULTURE, BLOOD (ROUTINE X 2)
CULTURE: NO GROWTH
CULTURE: NO GROWTH

## 2016-11-18 ENCOUNTER — Ambulatory Visit
Admission: EM | Admit: 2016-11-18 | Discharge: 2016-11-18 | Disposition: A | Discharge status: Home / Self Care | Payer: 59 | Attending: Family Medicine | Admitting: Family Medicine

## 2016-11-18 ENCOUNTER — Encounter: Payer: Self-pay | Admitting: Emergency Medicine

## 2016-11-18 DIAGNOSIS — J111 Influenza due to unidentified influenza virus with other respiratory manifestations: Secondary | ICD-10-CM

## 2016-11-18 DIAGNOSIS — R69 Illness, unspecified: Secondary | ICD-10-CM

## 2016-11-18 LAB — RAPID INFLUENZA A&B ANTIGENS (ARMC ONLY): INFLUENZA A (ARMC): NEGATIVE

## 2016-11-18 LAB — RAPID INFLUENZA A&B ANTIGENS: Influenza B (ARMC): NEGATIVE

## 2016-11-18 MED ORDER — HYDROCOD POLST-CPM POLST ER 10-8 MG/5ML PO SUER: 5.0000 mL | Freq: Two times a day (BID) | ORAL | 0 refills | Status: DC | PRN

## 2016-11-18 MED ORDER — OSELTAMIVIR PHOSPHATE 75 MG PO CAPS: 75.0000 mg | ORAL_CAPSULE | Freq: Two times a day (BID) | ORAL | 0 refills | Status: DC

## 2016-11-18 NOTE — ED Triage Notes (Signed)
Patient c/o bodyaches, cough, and fever for the past 3 days.

## 2016-11-18 NOTE — ED Provider Notes (Signed)
[facility]    CSN: [id number] Arrival date & time: 11/18/16  1258     History   Chief Complaint Chief Complaint  Patient presents with  . Generalized Body Aches  . Cough    HPI Emma Thomas is a 21 y.o. female.   The history is provided by the patient.  Cough  Associated symptoms: fever and myalgias   URI  Presenting symptoms: congestion, cough and fever   Severity:  Moderate Onset quality:  Sudden Duration:  3 days Timing:  Constant Progression:  Worsening Chronicity:  New Relieved by:  Nothing Ineffective treatments:  OTC medications Associated symptoms: myalgias   Risk factors: diabetes mellitus and sick contacts   Risk factors: not elderly, no chronic cardiac disease, no chronic kidney disease, no chronic respiratory disease, no immunosuppression, no recent illness and no recent travel     Past Medical History:  Diagnosis Date  . Diabetes mellitus without complication (HCC)    Type 1 DM  . Hypertension     Patient Active Problem List   Diagnosis Date Noted  . Pyelonephritis 07/30/2016  . Sepsis ([place]) 06/08/2016  . DKA (diabetic ketoacidoses) ([place]) 09/09/2015    Past Surgical History:  Procedure Laterality Date  . abscess removal      OB History    No data available       Home Medications    Prior to Admission medications   Medication Sig Start Date End Date Taking? Authorizing Provider  chlorpheniramine-HYDROcodone (TUSSIONEX PENNKINETIC ER) 10-8 MG/5ML SUER Take 5 mLs by mouth every 12 (twelve) hours as needed. 11/18/16   Norval Gable, MD  insulin glargine (LANTUS) 100 UNIT/ML injection Inject 0.26 mLs (26 Units total) into the skin daily. 08/02/16   Epifanio Lesches, MD  insulin lispro (HUMALOG) 100 UNIT/ML injection Inject 8 Units into the skin 3 (three) times daily before meals.    Historical Provider, MD  Levonorgestrel (SKYLA IU) 1 Device by Intrauterine route.    Historical Provider, MD  oseltamivir (TAMIFLU) 75 MG  capsule Take 1 capsule (75 mg total) by mouth 2 (two) times daily. 11/18/16   Norval Gable, MD    Family History Family History  Problem Relation Age of Onset  . Breast cancer Mother   . Diabetes type II Brother     Social History Social History  Substance Use Topics  . Smoking status: Never Smoker  . Smokeless tobacco: Never Used  . Alcohol use 1.2 oz/week    2 Shots of liquor per week     Allergies   [name].   Review of Systems Review of Systems  Constitutional: Positive for fever.  HENT: Positive for congestion.   Respiratory: Positive for cough.   Musculoskeletal: Positive for myalgias.     Physical Exam Triage Vital Signs ED Triage Vitals  Enc Vitals Group     BP 11/18/16 1408 131/87     Pulse Rate 11/18/16 1408 (!) 106     Resp 11/18/16 1408 16     Temp 11/18/16 1408 99 F (37.2 C)     Temp Source 11/18/16 1408 Oral     SpO2 11/18/16 1408 100 %     Weight 11/18/16 1407 150 lb (68 kg)     Height 11/18/16 1407 5\' 6"  (1.676 m)     Head Circumference --      Peak Flow --      Pain Score 11/18/16 1410 9     Pain Loc --  Pain Edu? --      Excl. in [place]? --    No data found.   Updated Vital Signs BP 131/87 (BP Location: Left Arm)   Pulse (!) 106   Temp 99 F (37.2 C) (Oral)   Resp 16   Ht 5\' 6"  (1.676 m)   Wt 150 lb (68 kg)   SpO2 100%   BMI 24.21 kg/m   Visual Acuity Right Eye Distance:   Left Eye Distance:   Bilateral Distance:    Right Eye Near:   Left Eye Near:    Bilateral Near:     Physical Exam  Constitutional: She appears well-developed and well-nourished. No distress.  HENT:  Head: Normocephalic and atraumatic.  Right Ear: Tympanic membrane, external ear and ear canal normal.  Left Ear: Tympanic membrane, external ear and ear canal normal.  Nose: Mucosal edema and rhinorrhea present. No nose lacerations, sinus tenderness, nasal deformity, septal deviation or nasal septal hematoma. No epistaxis.  No  foreign bodies.  Mouth/Throat: Uvula is midline, oropharynx is clear and moist and mucous membranes are normal. No oropharyngeal exudate.  Eyes: Conjunctivae and EOM are normal. Pupils are equal, round, and reactive to light. Right eye exhibits no discharge. Left eye exhibits no discharge. No scleral icterus.  Neck: Normal range of motion. Neck supple. No thyromegaly present.  Cardiovascular: Normal rate, regular rhythm and normal heart sounds.   Pulmonary/Chest: Effort normal and breath sounds normal. No respiratory distress. She has no wheezes. She has no rales.  Lymphadenopathy:    She has no cervical adenopathy.  Skin: She is not diaphoretic.  Nursing note and vitals reviewed.    UC Treatments / Results  Labs (all labs ordered are listed, but only abnormal results are displayed) Labs Reviewed  RAPID INFLUENZA A&B ANTIGENS ([place])    EKG  EKG Interpretation None       Radiology No results found.  Procedures Procedures (including critical care time)  Medications Ordered in UC Medications - No data to display   Initial Impression / Assessment and Plan / UC Course  I have reviewed the triage vital signs and the nursing notes.  Pertinent labs & imaging results that were available during my care of the patient were reviewed by me and considered in my medical decision making (see chart for details).  Clinical Course       Final Clinical Impressions(s) / UC Diagnoses   Final diagnoses:  Influenza-like illness    New Prescriptions Discharge Medication List as of 11/18/2016  2:28 PM    START taking these medications   Details  chlorpheniramine-HYDROcodone (TUSSIONEX PENNKINETIC ER) 10-8 MG/5ML SUER Take 5 mLs by mouth every 12 (twelve) hours as needed., Starting Wed 11/18/2016, Normal    oseltamivir (TAMIFLU) 75 MG capsule Take 1 capsule (75 mg total) by mouth 2 (two) times daily., Starting Wed 11/18/2016, Normal       1. Lab results and diagnosis reviewed with  patient 2. rx as per orders above; reviewed possible side effects, interactions, risks and benefits  3. Recommend supportive treatment with rest, fluids, otc analgesics 4. Follow-up prn if symptoms worsen or don't improve   Norval Gable, MD 11/18/16 [phone]

## 2016-11-20 ENCOUNTER — Ambulatory Visit
Admission: EM | Admit: 2016-11-20 | Discharge: 2016-11-20 | Disposition: A | Discharge status: Home / Self Care | Payer: 59 | Attending: Family Medicine | Admitting: Family Medicine

## 2016-11-20 DIAGNOSIS — J111 Influenza due to unidentified influenza virus with other respiratory manifestations: Secondary | ICD-10-CM

## 2016-11-20 DIAGNOSIS — R69 Illness, unspecified: Secondary | ICD-10-CM | POA: Diagnosis not present

## 2016-11-20 LAB — GLUCOSE, CAPILLARY: Glucose-Capillary: 483 mg/dL — ABNORMAL HIGH (ref 65–99)

## 2016-11-20 NOTE — ED Triage Notes (Signed)
Pt seen here Wednesday and diagnosed with flu. She still has the sx of flu including cough and body aches. She doesn't want another flu test, but just needs an extended work note. Also reports FSBS has been higher since she's been sick and was 330 today.

## 2017-01-08 ENCOUNTER — Emergency Department
Admission: EM | Admit: 2017-01-08 | Discharge: 2017-01-08 | Disposition: A | Discharge status: Home / Self Care | Payer: 59 | Attending: Emergency Medicine | Admitting: Emergency Medicine

## 2017-01-08 DIAGNOSIS — N390 Urinary tract infection, site not specified: Secondary | ICD-10-CM | POA: Insufficient documentation

## 2017-01-08 DIAGNOSIS — I10 Essential (primary) hypertension: Secondary | ICD-10-CM | POA: Diagnosis not present

## 2017-01-08 DIAGNOSIS — E1065 Type 1 diabetes mellitus with hyperglycemia: Secondary | ICD-10-CM | POA: Diagnosis not present

## 2017-01-08 DIAGNOSIS — R739 Hyperglycemia, unspecified: Secondary | ICD-10-CM

## 2017-01-08 DIAGNOSIS — Z79899 Other long term (current) drug therapy: Secondary | ICD-10-CM | POA: Insufficient documentation

## 2017-01-08 DIAGNOSIS — M546 Pain in thoracic spine: Secondary | ICD-10-CM | POA: Diagnosis present

## 2017-01-08 LAB — GLUCOSE, CAPILLARY
Glucose-Capillary: 426 mg/dL — ABNORMAL HIGH (ref 65–99)
Glucose-Capillary: 509 mg/dL (ref 65–99)
Glucose-Capillary: 416 mg/dL — ABNORMAL HIGH (ref 65–99)

## 2017-01-08 LAB — URINALYSIS, COMPLETE (UACMP) WITH MICROSCOPIC
BACTERIA UA: NONE SEEN
BILIRUBIN URINE: NEGATIVE
Glucose, UA: >=500 mg/dL — AB
Ketones, ur: NEGATIVE mg/dL
Nitrite: NEGATIVE
PH: 6 (ref 5.0–8.0)
Protein, ur: 30 mg/dL — AB
SPECIFIC GRAVITY, URINE: 1.024 (ref 1.005–1.030)

## 2017-01-08 LAB — POCT PREGNANCY, URINE
PREG TEST UR: NEGATIVE
Preg Test, Ur: NEGATIVE

## 2017-01-08 LAB — BASIC METABOLIC PANEL
Anion gap: 8 (ref 5–15)
BUN: 10 mg/dL (ref 6–20)
CHLORIDE: 97 mmol/L — AB (ref 101–111)
CO2: 27 mmol/L (ref 22–32)
CREATININE: 0.66 mg/dL (ref 0.44–1.00)
Calcium: 9 mg/dL (ref 8.9–10.3)
GFR calc Af Amer: >60 mL/min (ref >60)
GLUCOSE: 483 mg/dL — AB (ref 65–99)
Potassium: 4.1 mmol/L (ref 3.5–5.1)
SODIUM: 132 mmol/L — AB (ref 135–145)

## 2017-01-08 LAB — CBC
HEMATOCRIT: 39 % (ref 35.0–47.0)
Hemoglobin: 13.3 g/dL (ref 12.0–16.0)
MCH: 31.5 pg (ref 26.0–34.0)
MCHC: 34.1 g/dL (ref 32.0–36.0)
MCV: 92.3 fL (ref 80.0–100.0)
PLATELETS: 372 10*3/uL (ref 150–440)
RBC: 4.22 MIL/uL (ref 3.80–5.20)
RDW: 13.5 % (ref 11.5–14.5)
WBC: 7.4 10*3/uL (ref 3.6–11.0)

## 2017-01-08 MED ORDER — INSULIN ASPART 100 UNIT/ML ~~LOC~~ SOLN
10.0000 [IU] | Freq: Once | SUBCUTANEOUS | Status: AC
  Administered 2017-01-08: 10 [IU] via SUBCUTANEOUS

## 2017-01-08 MED ORDER — SODIUM CHLORIDE 0.9 % IV SOLN
1000.0000 mL | Freq: Once | INTRAVENOUS | Status: AC
  Administered 2017-01-08: 1000 mL via INTRAVENOUS

## 2017-01-08 MED ORDER — SODIUM CHLORIDE 0.9 % IV BOLUS (SEPSIS)
1000.0000 mL | Freq: Once | INTRAVENOUS | Status: AC
  Administered 2017-01-08: 1000 mL via INTRAVENOUS

## 2017-01-08 MED ORDER — INSULIN ASPART 100 UNIT/ML ~~LOC~~ SOLN
SUBCUTANEOUS | Status: AC
  Administered 2017-01-08: 10 [IU] via SUBCUTANEOUS
  Filled 2017-01-08: qty 10

## 2017-01-08 MED ORDER — INSULIN ASPART 100 UNIT/ML ~~LOC~~ SOLN
5.0000 [IU] | Freq: Once | SUBCUTANEOUS | Status: AC
Start: 2017-01-08 — End: 2017-01-08
  Administered 2017-01-08: 5 [IU] via INTRAVENOUS

## 2017-01-08 MED ORDER — CEPHALEXIN 500 MG PO CAPS: 500.0000 mg | ORAL_CAPSULE | Freq: Two times a day (BID) | ORAL | 0 refills | Status: DC

## 2017-01-08 MED ORDER — INSULIN ASPART 100 UNIT/ML ~~LOC~~ SOLN
SUBCUTANEOUS | Status: AC
  Administered 2017-01-08: 5 [IU] via INTRAVENOUS
  Filled 2017-01-08: qty 5

## 2017-01-08 NOTE — ED Provider Notes (Addendum)
[facility] Emergency Department Provider Note   ____________________________________________    I have reviewed the triage vital signs and the nursing notes.   HISTORY  Chief Complaint Back Pain and Hyperglycemia     HPI Emma Thomas is a 21 y.o. female who presents with complaints of elevated blood sugar. Patient reports she had labs done 3 days ago by her PCP and they called her and told her she did go to the emergency department because she was in DKA but she was unable to do so because of work. She also complains of mild left sided back discomfort. Does have urinary frequency but denies dysuria. Denies fevers. No nausea or vomiting   Past Medical History:  Diagnosis Date  . Diabetes mellitus without complication (HCC)    Type 1 DM  . Hypertension     Patient Active Problem List   Diagnosis Date Noted  . Pyelonephritis 07/30/2016  . Sepsis ([place]) 06/08/2016  . DKA (diabetic ketoacidoses) ([place]) 09/09/2015    Past Surgical History:  Procedure Laterality Date  . abscess removal      Prior to Admission medications   Medication Sig Start Date End Date Taking? Authorizing Provider  cephALEXin (KEFLEX) 500 MG capsule Take 1 capsule (500 mg total) by mouth 2 (two) times daily. 01/08/17   Lavonia Drafts, MD  chlorpheniramine-HYDROcodone [facility] ER) 10-8 MG/5ML SUER Take 5 mLs by mouth every 12 (twelve) hours as needed. 11/18/16   Norval Gable, MD  insulin glargine (LANTUS) 100 UNIT/ML injection Inject 0.26 mLs (26 Units total) into the skin daily. 08/02/16   Epifanio Lesches, MD  insulin lispro (HUMALOG) 100 UNIT/ML injection Inject 8 Units into the skin 3 (three) times daily before meals.    Historical Provider, MD  Levonorgestrel (SKYLA IU) 1 Device by Intrauterine route.    Historical Provider, MD  oseltamivir (TAMIFLU) 75 MG capsule Take 1 capsule (75 mg total) by mouth 2 (two) times daily. 11/18/16   Norval Gable, MD    oseltamivir (TAMIFLU) 75 MG capsule Take 1 capsule (75 mg total) by mouth 2 (two) times daily. 11/18/16   Norval Gable, MD     Allergies [name].  Family History  Problem Relation Age of Onset  . Breast cancer Mother   . Diabetes type II Brother     Social History Social History  Substance Use Topics  . Smoking status: Never Smoker  . Smokeless tobacco: Never Used  . Alcohol use 1.2 oz/week    2 Shots of liquor per week    Review of Systems  Constitutional: No fever/chills Eyes: No visual changes.  . Cardiovascular: Denies chest pain. Respiratory: Denies shortness of breath. Gastrointestinal: No abdominal pain.  No nausea, no vomiting.   Genitourinary: Positive for frequency Musculoskeletal: Negative for back pain. Skin: Negative for rash. Neurological: Negative for headaches  10-point ROS otherwise negative.  ____________________________________________   PHYSICAL EXAM:  VITAL SIGNS: ED Triage Vitals [01/08/17 1848]  Enc Vitals Group     BP (!) 165/99     Pulse Rate (!) 109     Resp 18     Temp 99.1 F (37.3 C)     Temp Source Oral     SpO2 100 %     Weight 155 lb (70.3 kg)     Height 5\' 6"  (1.676 m)     Head Circumference      Peak Flow      Pain Score 8  Pain Loc      Pain Edu?      Excl. in [place]?     Constitutional: Alert and oriented. No acute distress. Pleasant and interactive Eyes: Conjunctivae are normal.   Nose: No congestion/rhinnorhea. Mouth/Throat: Mucous membranes are moist.   Neck:  Painless ROM Cardiovascular:Tachycardia, regular rhythm. Grossly normal heart sounds.  Good peripheral circulation. Respiratory: Normal respiratory effort.  No retractions. Lungs CTAB. Gastrointestinal: Soft and nontender. No distention.  No CVA tenderness. Genitourinary: deferred Musculoskeletal: No lower extremity tenderness nor edema.  Warm and well perfused Neurologic:  Normal speech and language. No gross focal neurologic  deficits are appreciated.  Skin:  Skin is warm, dry and intact. No rash noted. Psychiatric: Mood and affect are normal. Speech and behavior are normal.  ____________________________________________   LABS (all labs ordered are listed, but only abnormal results are displayed)  Labs Reviewed  BASIC METABOLIC PANEL - Abnormal; Notable for the following:       Result Value   Sodium 132 (*)    Chloride 97 (*)    Glucose, Bld 483 (*)    All other components within normal limits  URINALYSIS, COMPLETE (UACMP) WITH MICROSCOPIC - Abnormal; Notable for the following:    Color, Urine STRAW (*)    APPearance CLEAR (*)    Glucose, UA >=500 (*)    Hgb urine dipstick LARGE (*)    Protein, ur 30 (*)    Leukocytes, UA TRACE (*)    Squamous Epithelial / LPF 0-5 (*)    All other components within normal limits  GLUCOSE, CAPILLARY - Abnormal; Notable for the following:    Glucose-Capillary 426 (*)    All other components within normal limits  GLUCOSE, CAPILLARY - Abnormal; Notable for the following:    Glucose-Capillary 509 (*)    All other components within normal limits  GLUCOSE, CAPILLARY - Abnormal; Notable for the following:    Glucose-Capillary 416 (*)    All other components within normal limits  CBC  CBG MONITORING, ED  POC URINE PREG, ED  POCT PREGNANCY, URINE  POCT PREGNANCY, URINE  CBG MONITORING, ED  CBG MONITORING, ED   ____________________________________________  EKG  None ____________________________________________  RADIOLOGY  None ____________________________________________   PROCEDURES  Procedure(s) performed: No    Critical Care performed:No ____________________________________________   INITIAL IMPRESSION / ASSESSMENT AND PLAN / ED COURSE  Pertinent labs & imaging results that were available during my care of the patient were reviewed by me and considered in my medical decision making (see chart for details).  Patient well-appearing and in no  acute distress, she does have mild tachycardia. Laboratory is reassuring, anion gap is normal, bicarbonate is normal. She does appear to have a urinary tract infection however She does not appear to be in DKA, we will give IV fluids and recheck sugar.    ----------------------------------------- 10:09 PM on 01/08/2017 ----------------------------------------- Patient just received additional dose of insulin, she now wants to leave. She does not want to stay for additional glucose check. She states she feels comfortable and can check her own sugar. We will prescribe her Keflex for a urinary tract infection. She she can return at any time  ----------------------------------------- 10:19 PM on 01/08/2017 -----------------------------------------  Informed by nurse that patient's heart rate is still mildly elevated, patient says it "is always like this ". Recommended for her to stay longer for further fluids which she refuses and wants to leave because she is "tired of being here ". ____________________________________________   FINAL CLINICAL  IMPRESSION(S) / ED DIAGNOSES  Final diagnoses:  Hyperglycemia  Lower urinary tract infectious disease      NEW MEDICATIONS STARTED DURING THIS VISIT:  New Prescriptions   CEPHALEXIN (KEFLEX) 500 MG CAPSULE    Take 1 capsule (500 mg total) by mouth 2 (two) times daily.     Note:  This document was prepared using Dragon voice recognition software and may include unintentional dictation errors.    Lavonia Drafts, MD 01/08/17 3267    Lavonia Drafts, MD 01/08/17 2220

## 2017-01-08 NOTE — ED Notes (Signed)
Pt up to use restroom.

## 2017-01-08 NOTE — ED Triage Notes (Signed)
Pt reports she was told that she was in DKA on Monday, could not come to ED due to work. Pt reports lower back and side pains since. Pt states CBG in 500's. Pt insulin dependent.

## 2017-01-28 NOTE — ED Provider Notes (Signed)
[facility]    CSN: [id number] Arrival date & time: 11/20/16  1248     History   Chief Complaint Chief Complaint  Patient presents with  . Influenza  . Hyperglycemia    HPI Emma Thomas is a 21 y.o. female.   21 yo female with a c/o continuing flu symptoms. Was seen here 2 days ago and started on Tamiflu. States she just needs a note for work.     Influenza  Presenting symptoms: cough, fatigue and fever   Severity:  Moderate Hyperglycemia  Associated symptoms: fatigue and fever     Past Medical History:  Diagnosis Date  . Diabetes mellitus without complication (HCC)    Type 1 DM  . Hypertension     Patient Active Problem List   Diagnosis Date Noted  . Pyelonephritis 07/30/2016  . Sepsis ([place]) 06/08/2016  . DKA (diabetic ketoacidoses) ([place]) 09/09/2015    Past Surgical History:  Procedure Laterality Date  . abscess removal      OB History    No data available       Home Medications    Prior to Admission medications   Medication Sig Start Date End Date Taking? Authorizing Provider  cephALEXin (KEFLEX) 500 MG capsule Take 1 capsule (500 mg total) by mouth 2 (two) times daily. 01/08/17   Lavonia Drafts, MD  chlorpheniramine-HYDROcodone [facility]) 10-8 MG/5ML SUER Take 5 mLs by mouth every 12 (twelve) hours as needed. 11/18/16   Norval Gable, MD  insulin glargine (LANTUS) 100 UNIT/ML injection Inject 0.26 mLs (26 Units total) into the skin daily. 08/02/16   Epifanio Lesches, MD  insulin lispro (HUMALOG) 100 UNIT/ML injection Inject 8 Units into the skin 3 (three) times daily before meals.    Historical Provider, MD  Levonorgestrel (SKYLA IU) 1 Device by Intrauterine route.    Historical Provider, MD  oseltamivir (TAMIFLU) 75 MG capsule Take 1 capsule (75 mg total) by mouth 2 (two) times daily. 11/18/16   Norval Gable, MD  oseltamivir (TAMIFLU) 75 MG capsule Take 1 capsule (75 mg total) by mouth 2 (two) times daily. 11/18/16    Norval Gable, MD    Family History Family History  Problem Relation Age of Onset  . Breast cancer Mother   . Diabetes type II Brother     Social History Social History  Substance Use Topics  . Smoking status: Never Smoker  . Smokeless tobacco: Never Used  . Alcohol use 1.2 oz/week    2 Shots of liquor per week     Allergies   [name].   Review of Systems Review of Systems  Constitutional: Positive for fatigue and fever.  Respiratory: Positive for cough.      Physical Exam Triage Vital Signs ED Triage Vitals  Enc Vitals Group     BP 11/20/16 1419 126/83     Pulse Rate 11/20/16 1419 (!) 110     Resp 11/20/16 1419 18     Temp 11/20/16 1419 98.8 F (37.1 C)     Temp Source 11/20/16 1419 Oral     SpO2 11/20/16 1419 100 %     Weight 11/20/16 1420 150 lb (68 kg)     Height 11/20/16 1420 5\' 6"  (1.676 m)     Head Circumference --      Peak Flow --      Pain Score 11/20/16 1421 8     Pain Loc --      Pain Edu? --  Excl. in [place]? --    No data found.   Updated Vital Signs BP 126/83   Pulse (!) 109   Temp 98.8 F (37.1 C) (Oral)   Resp 18   Ht 5\' 6"  (1.676 m)   Wt 150 lb (68 kg)   SpO2 100%   BMI 24.21 kg/m   Visual Acuity Right Eye Distance:   Left Eye Distance:   Bilateral Distance:    Right Eye Near:   Left Eye Near:    Bilateral Near:     Physical Exam  Constitutional: She appears well-developed and well-nourished. No distress (.).  HENT:  Head: Normocephalic and atraumatic.  Right Ear: Tympanic membrane, external ear and ear canal normal.  Left Ear: Tympanic membrane, external ear and ear canal normal.  Nose: Rhinorrhea present. No mucosal edema, nose lacerations, sinus tenderness, nasal deformity, septal deviation or nasal septal hematoma. No epistaxis.  No foreign bodies. Right sinus exhibits no maxillary sinus tenderness and no frontal sinus tenderness. Left sinus exhibits no maxillary sinus tenderness and no  frontal sinus tenderness.  Mouth/Throat: Uvula is midline, oropharynx is clear and moist and mucous membranes are normal. No oropharyngeal exudate.  Eyes: Conjunctivae and EOM are normal. Pupils are equal, round, and reactive to light. Right eye exhibits no discharge. Left eye exhibits no discharge. No scleral icterus.  Neck: Normal range of motion. Neck supple. No thyromegaly present.  Cardiovascular: Normal rate, regular rhythm and normal heart sounds.   Pulmonary/Chest: Effort normal and breath sounds normal. No respiratory distress. She has no wheezes. She has no rales.  Lymphadenopathy:    She has no cervical adenopathy.  Skin: She is not diaphoretic.  Nursing note and vitals reviewed.    UC Treatments / Results  Labs (all labs ordered are listed, but only abnormal results are displayed) Labs Reviewed  GLUCOSE, CAPILLARY - Abnormal; Notable for the following:       Result Value   Glucose-Capillary 483 (*)    All other components within normal limits  CBG MONITORING, ED    EKG  EKG Interpretation None       Radiology No results found.  Procedures Procedures (including critical care time)  Medications Ordered in UC Medications - No data to display   Initial Impression / Assessment and Plan / UC Course  I have reviewed the triage vital signs and the nursing notes.  Pertinent labs & imaging results that were available during my care of the patient were reviewed by me and considered in my medical decision making (see chart for details).       Final Clinical Impressions(s) / UC Diagnoses   Final diagnoses:  Influenza-like illness    New Prescriptions Discharge Medication List as of 11/20/2016  3:16 PM     1. diagnosis reviewed with patient 2. Recommend continue supportive treatment  3. Follow-up prn if symptoms worsen or don't improve   Norval Gable, MD 01/28/17 1244

## 2017-07-31 ENCOUNTER — Encounter: Payer: Self-pay | Admitting: Emergency Medicine

## 2017-07-31 ENCOUNTER — Inpatient Hospital Stay
Admission: EM | Admit: 2017-07-31 | Discharge: 2017-08-02 | DRG: 638 | Disposition: A | Discharge status: Home / Self Care | Payer: 59 | Attending: Internal Medicine | Admitting: Internal Medicine

## 2017-07-31 DIAGNOSIS — Z9119 Patient's noncompliance with other medical treatment and regimen: Secondary | ICD-10-CM

## 2017-07-31 DIAGNOSIS — Z803 Family history of malignant neoplasm of breast: Secondary | ICD-10-CM

## 2017-07-31 DIAGNOSIS — E10649 Type 1 diabetes mellitus with hypoglycemia without coma: Secondary | ICD-10-CM | POA: Diagnosis present

## 2017-07-31 DIAGNOSIS — Z833 Family history of diabetes mellitus: Secondary | ICD-10-CM

## 2017-07-31 DIAGNOSIS — E1010 Type 1 diabetes mellitus with ketoacidosis without coma: Secondary | ICD-10-CM | POA: Diagnosis not present

## 2017-07-31 DIAGNOSIS — Z794 Long term (current) use of insulin: Secondary | ICD-10-CM

## 2017-07-31 DIAGNOSIS — Z79899 Other long term (current) drug therapy: Secondary | ICD-10-CM | POA: Diagnosis not present

## 2017-07-31 DIAGNOSIS — E860 Dehydration: Secondary | ICD-10-CM | POA: Diagnosis present

## 2017-07-31 DIAGNOSIS — R000 Tachycardia, unspecified: Secondary | ICD-10-CM | POA: Diagnosis present

## 2017-07-31 DIAGNOSIS — E1110 Type 2 diabetes mellitus with ketoacidosis without coma: Secondary | ICD-10-CM | POA: Diagnosis present

## 2017-07-31 DIAGNOSIS — N179 Acute kidney failure, unspecified: Secondary | ICD-10-CM | POA: Diagnosis present

## 2017-07-31 LAB — MAGNESIUM: Magnesium: 2 mg/dL (ref 1.7–2.4)

## 2017-07-31 LAB — GLUCOSE, CAPILLARY
GLUCOSE-CAPILLARY: 355 mg/dL — AB (ref 65–99)
GLUCOSE-CAPILLARY: 570 mg/dL — AB (ref 65–99)
Glucose-Capillary: >600 mg/dL (ref 65–99)
Glucose-Capillary: 466 mg/dL — ABNORMAL HIGH (ref 65–99)

## 2017-07-31 LAB — URINALYSIS, COMPLETE (UACMP) WITH MICROSCOPIC
Bacteria, UA: NONE SEEN
Bilirubin Urine: NEGATIVE
Glucose, UA: >=500 mg/dL — AB
Ketones, ur: 80 mg/dL — AB
Nitrite: NEGATIVE
Protein, ur: 30 mg/dL — AB
Specific Gravity, Urine: 1.017 (ref 1.005–1.030)
pH: 5 (ref 5.0–8.0)

## 2017-07-31 LAB — BLOOD GAS, VENOUS
Acid-base deficit: 16.9 mmol/L — ABNORMAL HIGH (ref 0.0–2.0)
BICARBONATE: 12.4 mmol/L — AB (ref 20.0–28.0)
PCO2 VEN: 41 mmHg — AB (ref 44.0–60.0)
PH VEN: 7.09 — AB (ref 7.250–7.430)
Patient temperature: 37

## 2017-07-31 LAB — CBC
HCT: 42.5 % (ref 35.0–47.0)
Hemoglobin: 14 g/dL (ref 12.0–16.0)
MCH: 31.2 pg (ref 26.0–34.0)
MCHC: 32.9 g/dL (ref 32.0–36.0)
MCV: 94.8 fL (ref 80.0–100.0)
Platelets: 401 10*3/uL (ref 150–440)
RBC: 4.48 MIL/uL (ref 3.80–5.20)
RDW: 13 % (ref 11.5–14.5)
WBC: 18.9 10*3/uL — ABNORMAL HIGH (ref 3.6–11.0)

## 2017-07-31 LAB — BETA-HYDROXYBUTYRIC ACID: Beta-Hydroxybutyric Acid: >8 mmol/L — ABNORMAL HIGH (ref 0.05–0.27)

## 2017-07-31 LAB — BASIC METABOLIC PANEL
Anion gap: 29 — ABNORMAL HIGH (ref 5–15)
Anion gap: 20 — ABNORMAL HIGH (ref 5–15)
BUN: 26 mg/dL — ABNORMAL HIGH (ref 6–20)
BUN: 24 mg/dL — AB (ref 6–20)
CHLORIDE: 110 mmol/L (ref 101–111)
CO2: 13 mmol/L — ABNORMAL LOW (ref 22–32)
CO2: 14 mmol/L — ABNORMAL LOW (ref 22–32)
CREATININE: 1.15 mg/dL — AB (ref 0.44–1.00)
Calcium: 10 mg/dL (ref 8.9–10.3)
Calcium: 8.7 mg/dL — ABNORMAL LOW (ref 8.9–10.3)
Chloride: 99 mmol/L — ABNORMAL LOW (ref 101–111)
Creatinine, Ser: 1.32 mg/dL — ABNORMAL HIGH (ref 0.44–1.00)
GFR calc Af Amer: >60 mL/min (ref >60)
GFR calc Af Amer: >60 mL/min (ref >60)
GFR calc non Af Amer: 57 mL/min — ABNORMAL LOW (ref >60)
GFR calc non Af Amer: >60 mL/min (ref >60)
Glucose, Bld: 649 mg/dL (ref 65–99)
Glucose, Bld: 440 mg/dL — ABNORMAL HIGH (ref 65–99)
POTASSIUM: 4.2 mmol/L (ref 3.5–5.1)
Potassium: 5 mmol/L (ref 3.5–5.1)
Sodium: 141 mmol/L (ref 135–145)
Sodium: 144 mmol/L (ref 135–145)

## 2017-07-31 LAB — LIPASE, BLOOD: Lipase: 12 U/L (ref 11–51)

## 2017-07-31 LAB — TROPONIN I: Troponin I: <0.03 ng/mL (ref <0.03)

## 2017-07-31 LAB — PHOSPHORUS: Phosphorus: 4.5 mg/dL (ref 2.5–4.6)

## 2017-07-31 LAB — PREGNANCY, URINE: Preg Test, Ur: NEGATIVE

## 2017-07-31 MED ORDER — ONDANSETRON HCL 4 MG/2ML IJ SOLN
4.0000 mg | Freq: Once | INTRAMUSCULAR | Status: AC
  Administered 2017-07-31: 4 mg via INTRAVENOUS
  Filled 2017-07-31: qty 2

## 2017-07-31 MED ORDER — SODIUM CHLORIDE 0.9 % IV BOLUS (SEPSIS)
1000.0000 mL | Freq: Once | INTRAVENOUS | Status: AC
  Administered 2017-07-31: 1000 mL via INTRAVENOUS

## 2017-07-31 MED ORDER — SODIUM CHLORIDE 0.9 % IV SOLN
Freq: Once | INTRAVENOUS | Status: AC
  Administered 2017-07-31: 21:00:00 via INTRAVENOUS

## 2017-07-31 MED ORDER — DEXTROSE-NACL 5-0.45 % IV SOLN
INTRAVENOUS | Status: DC
  Administered 2017-08-01: 125 mL/h via INTRAVENOUS

## 2017-07-31 MED ORDER — INSULIN REGULAR HUMAN 100 UNIT/ML IJ SOLN
INTRAMUSCULAR | Status: DC
  Filled 2017-07-31: qty 1

## 2017-07-31 MED ORDER — SODIUM CHLORIDE 0.9 % IV SOLN
Freq: Once | INTRAVENOUS | Status: AC
  Administered 2017-07-31: 999 mL/h via INTRAVENOUS

## 2017-07-31 MED ORDER — LIDOCAINE 5 % EX PTCH
1.0000 | MEDICATED_PATCH | CUTANEOUS | Status: DC
  Administered 2017-08-01: 1 via TRANSDERMAL
  Filled 2017-07-31: qty 3
  Filled 2017-07-31: qty 1

## 2017-07-31 MED ORDER — ACETAMINOPHEN 325 MG PO TABS: 650.0000 mg | ORAL_TABLET | Freq: Four times a day (QID) | ORAL | Status: DC | PRN

## 2017-07-31 MED ORDER — SODIUM CHLORIDE 0.9 % IV SOLN
INTRAVENOUS | Status: DC
  Administered 2017-07-31: 5.4 [IU]/h via INTRAVENOUS
  Filled 2017-07-31: qty 1

## 2017-07-31 MED ORDER — PROCHLORPERAZINE EDISYLATE 5 MG/ML IJ SOLN
5.0000 mg | Freq: Four times a day (QID) | INTRAMUSCULAR | Status: DC | PRN
  Administered 2017-08-01: 5 mg via INTRAVENOUS
  Filled 2017-07-31 (×2): qty 1

## 2017-07-31 MED ORDER — LIDOCAINE 5 % EX PTCH
1.0000 | MEDICATED_PATCH | CUTANEOUS | Status: DC
  Administered 2017-07-31: 1 via TRANSDERMAL

## 2017-07-31 MED ORDER — LIDOCAINE 5 % EX PTCH
MEDICATED_PATCH | CUTANEOUS | Status: AC
  Administered 2017-07-31: 1 via TRANSDERMAL
  Filled 2017-07-31: qty 1

## 2017-07-31 MED ORDER — MORPHINE SULFATE (PF) 4 MG/ML IV SOLN
INTRAVENOUS | Status: AC
  Administered 2017-07-31: 4 mg via INTRAVENOUS
  Filled 2017-07-31: qty 1

## 2017-07-31 MED ORDER — CYCLOBENZAPRINE HCL 10 MG PO TABS
ORAL_TABLET | ORAL | Status: AC
  Administered 2017-07-31: 5 mg via ORAL
  Filled 2017-07-31: qty 1

## 2017-07-31 MED ORDER — CYCLOBENZAPRINE HCL 10 MG PO TABS
5.0000 mg | ORAL_TABLET | Freq: Three times a day (TID) | ORAL | Status: DC | PRN
  Administered 2017-07-31: 5 mg via ORAL
  Filled 2017-07-31: qty 1

## 2017-07-31 MED ORDER — MORPHINE SULFATE (PF) 4 MG/ML IV SOLN
4.0000 mg | Freq: Once | INTRAVENOUS | Status: AC
  Administered 2017-07-31: 4 mg via INTRAVENOUS
  Filled 2017-07-31: qty 1

## 2017-07-31 MED ORDER — SODIUM CHLORIDE 0.9 % IV SOLN
INTRAVENOUS | Status: DC
  Administered 2017-07-31: 125 mL/h via INTRAVENOUS

## 2017-07-31 MED ORDER — POTASSIUM CHLORIDE 10 MEQ/100ML IV SOLN
10.0000 meq | INTRAVENOUS | Status: AC
  Administered 2017-07-31 (×2): 10 meq via INTRAVENOUS
  Filled 2017-07-31 (×2): qty 100

## 2017-07-31 MED ORDER — MORPHINE SULFATE (PF) 4 MG/ML IV SOLN
4.0000 mg | Freq: Once | INTRAVENOUS | Status: AC
  Administered 2017-07-31: 4 mg via INTRAVENOUS

## 2017-07-31 NOTE — ED Triage Notes (Signed)
Pt arrives via [facility] with c/o hyperglycemia. Pt has CBG of 570 in triage. Pt appears weak and lethargic at this time in triage. Pt also c/o N/V all day.

## 2017-07-31 NOTE — Consult Note (Signed)
PULMONARY / CRITICAL CARE MEDICINE   Name: Emma Thomas MRN: [id number] DOB: 1995-12-26    ADMISSION DATE:  07/31/2017   CONSULTATION DATE:  07/31/17  REFERRING MD:  Dr Hugelmelyer  REASON: DKA   CHIEF COMPLAINT:  Nausea and vomiting  HISTORY OF PRESENT ILLNESS:   21 y/o AA female with a history of type 1 diabetes who presented to the emergency room with a one-day history of nausea and vomiting. Patient states that she has not been very compliant with her insulin. This morning his she started having severe nausea and vomiting and symptoms got worse during the course of the day hence she decided to come to the emergency room. She is currently on an insulin drip and her blood sugars are still in the 400s. She  reports persistent nausea but no vomiting.  PAST MEDICAL HISTORY :  She  has a past medical history of Diabetes mellitus without complication ([place]).  PAST SURGICAL HISTORY: She  has a past surgical history that includes abscess removal.  No Known Allergies  No current facility-administered medications on file prior to encounter.    Current Outpatient Prescriptions on File Prior to Encounter  Medication Sig  . Levonorgestrel (SKYLA IU) 1 Device by Intrauterine route.    FAMILY HISTORY:  Her indicated that her mother is deceased. She indicated that her father is alive. She indicated that the status of her brother is unknown.    SOCIAL HISTORY: She  reports that she has never smoked. She has never used smokeless tobacco. She reports that she drinks about 1.2 oz of alcohol per week . She reports that she does not use drugs.  REVIEW OF SYSTEMS:   Constitutional: Negative for fever and chills but positive for generalized malaise.  HENT: Negative for congestion and rhinorrhea.  Eyes: Negative for redness and visual disturbance.  Respiratory: Negative for shortness of breath and wheezing.  Cardiovascular: Negative for chest pain and palpitations.  Gastrointestinal:  Positive  for nausea , vomiting but negative abdominal pain and  Loose stools Genitourinary: Negative for dysuria and urgency.  Endocrine: Denies polyuria, polyphagia and heat intolerance Musculoskeletal: Negative for myalgias and arthralgias.  Skin: Negative for pallor and wound.  Neurological: Negative for dizziness and headaches   SUBJECTIVE:    VITAL SIGNS: BP 135/88   Pulse (!) 116   Temp 98.2 F (36.8 C) (Oral)   Resp (!) 27   Ht 5\' 6"  (1.676 m)   Wt 139 lb (63 kg)   SpO2 99%   BMI 22.44 kg/m   HEMODYNAMICS:    VENTILATOR SETTINGS:    INTAKE / OUTPUT: No intake/output data recorded.  PHYSICAL EXAMINATION: General: Acutely ill-looking Neuro:  Alert and oriented 4, no focal deficits HEENT:  PERRLA, trachea midline, no JVD, oral mucosa dry Cardiovascular: Tachycardic with a heart rate of 112, S1, S2, no murmur, regurg or gallop, no edema Lungs: Clear to auscultation bilaterally Abdomen:  Nondistended, normal bowel sounds, palpation reveals no organomegaly Musculoskeletal:  No deformities, positive range of motion Skin:  Warm and dry, poor skin turgor  LABS:  BMET  Recent Labs Lab 07/31/17 2029 07/31/17 2302  NA 141 144  K 5.0 4.2  CL 99* 110  CO2 13* 14*  BUN 26* 24*  CREATININE 1.32* 1.15*  GLUCOSE 649* 440*    Electrolytes  Recent Labs Lab 07/31/17 2029 07/31/17 2302  CALCIUM 10.0 8.7*  MG  --  2.0  PHOS  --  4.5    CBC  Recent  Labs Lab 07/31/17 2029  WBC 18.9*  HGB 14.0  HCT 42.5  PLT 401    Coag's No results for input(s): APTT, INR in the last 168 hours.  Sepsis Markers No results for input(s): LATICACIDVEN, PROCALCITON, O2SATVEN in the last 168 hours.  ABG No results for input(s): PHART, PCO2ART, PO2ART in the last 168 hours.  Liver Enzymes No results for input(s): AST, ALT, ALKPHOS, BILITOT, ALBUMIN in the last 168 hours.  Cardiac Enzymes  Recent Labs Lab 07/31/17 2029  TROPONINI <0.03    Glucose  Recent  Labs Lab 07/31/17 2127 07/31/17 2217 07/31/17 2352 08/01/17 0053 08/01/17 0204 08/01/17 0251  GLUCAP >600* 466* 355* 308* 286* 240*    Imaging No results found.   CULTURES: Urine culture pending   SIGNIFICANT EVENTS: 07/31/2017: Admitted  LINES/TUBES: Peripheral IVs  DISCUSSION: 21 year old A female presenting with DKA, anion gap 29, and a blood glucose level of 649 mg/dL and intractable nausea and vomiting  ASSESSMENT DKA Intractable nausea and vomiting. AKI PLAN IV insulin per protocol. IV fluids Consult diabetes education. Monitor and correct electrolytes. Compazine and Reglan when necessary for nausea and vomiting. EKG when necessary DVT prophylaxis  FAMILY  - Updates: Patient updated on current treatment plan  - Inter-disciplinary family meet or Palliative Care meeting due by:  day [place]. [facility] ANP-BC Pulmonary and Critical Care Medicine [facility] Pager [phone] or [phone]  08/01/2017, 3:42 AM

## 2017-07-31 NOTE — ED Provider Notes (Signed)
[facility] Emergency Department Provider Note       Time seen: ----------------------------------------- 8:26 PM on 07/31/2017 -----------------------------------------     I have reviewed the triage vital signs and the nursing notes.   HISTORY   Chief Complaint Hyperglycemia    HPI Emma Thomas is a 21 y.o. female who presents to the ED for hyperglycemia. Patient has a blood sugar of 570 here. Patient reportedly has been weak and lethargic since arrival. She's had nausea and vomiting all day.patient states she's also had some back pain of uncertain etiology. Pain is 10 out of 10 in her low back. Patient states he does not feel like she has pyelonephritis. She denies fevers, chills or other complaints. Patient states her blood sugar has been somewhat elevated and sometimes she forgets to take her insulin.   Past Medical History:  Diagnosis Date  . Diabetes mellitus without complication ([place])    Type 1 DM    Patient Active Problem List   Diagnosis Date Noted  . Pyelonephritis 07/30/2016  . Sepsis ([place]) 06/08/2016  . DKA (diabetic ketoacidoses) ([place]) 09/09/2015    Past Surgical History:  Procedure Laterality Date  . abscess removal      Allergies [name].  Social History Social History  Substance Use Topics  . Smoking status: Never Smoker  . Smokeless tobacco: Never Used  . Alcohol use 1.2 oz/week    2 Shots of liquor per week    Review of Systems Constitutional: Negative for fever. Cardiovascular: Negative for chest pain. Respiratory: Negative for shortness of breath. Gastrointestinal: Negative for abdominal pain,positive for vomiting Genitourinary: Negative for dysuria. Musculoskeletal: positive for back pain Skin: Negative for rash. Neurological: Negative for headaches,positive for generalized weakness  All systems negative/normal/unremarkable except as stated in the  HPI  ____________________________________________   PHYSICAL EXAM:  VITAL SIGNS: ED Triage Vitals  Enc Vitals Group     BP 07/31/17 2016 (!) 94/56     Pulse Rate 07/31/17 2016 (!) 117     Resp 07/31/17 2016 20     Temp 07/31/17 2016 98.2 F (36.8 C)     Temp Source 07/31/17 2016 Oral     SpO2 07/31/17 2016 100 %     Weight 07/31/17 2017 139 lb (63 kg)     Height 07/31/17 2017 5\' 6"  (1.676 m)     Head Circumference --      Peak Flow --      Pain Score 07/31/17 2020 10     Pain Loc --      Pain Edu? --      Excl. in [place]? --     Constitutional: Alert and oriented. no distress Eyes: Conjunctivae are normal. Normal extraocular movements. ENT   Head: Normocephalic and atraumatic.   Nose: No congestion/rhinnorhea.   Mouth/Throat: Mucous membranes are moist.   Neck: No stridor. Cardiovascular: Normal rate, regular rhythm. No murmurs, rubs, or gallops. Respiratory: Normal respiratory effort without tachypnea nor retractions. Breath sounds are clear and equal bilaterally. No wheezes/rales/rhonchi. Gastrointestinal: Soft and nontender. Normal bowel sounds Musculoskeletal: Nontender with normal range of motion in extremities. No lower extremity tenderness nor edema. Neurologic:  Normal speech and language. No gross focal neurologic deficits are appreciated.  Skin:  Skin is warm, dry and intact. No rash noted. Psychiatric: flat affect ____________________________________________  EKG: Interpreted by me.sinus tachycardia rate 109 bpm, normal PR interval, normal QRS, long QT.  ____________________________________________  ED COURSE:  Pertinent labs & imaging results  that were available during my care of the patient were reviewed by me and considered in my medical decision making (see chart for details). Patient presents for hyperglycemia and weakness, we will assess with labs and imaging as indicated.   Procedures ____________________________________________    CRITICAL CARE Performed by: Earleen Newport   Total critical care time: 30 minutes  Critical care time was exclusive of separately billable procedures and treating other patients.  Critical care was necessary to treat or prevent imminent or life-threatening deterioration.  Critical care was time spent personally by me on the following activities: development of treatment plan with patient and/or surrogate as well as nursing, discussions with consultants, evaluation of patient's response to treatment, examination of patient, obtaining history from patient or surrogate, ordering and performing treatments and interventions, ordering and review of laboratory studies, ordering and review of radiographic studies, pulse oximetry and re-evaluation of patient's condition.   LABS (pertinent positives/negatives)  Labs Reviewed  BASIC METABOLIC PANEL - Abnormal; Notable for the following:       Result Value   Chloride 99 (*)    CO2 13 (*)    Glucose, Bld 649 (*)    BUN 26 (*)    Creatinine, Ser 1.32 (*)    GFR calc non Af Amer 57 (*)    Anion gap 29 (*)    All other components within normal limits  CBC - Abnormal; Notable for the following:    WBC 18.9 (*)    All other components within normal limits  BLOOD GAS, VENOUS - Abnormal; Notable for the following:    pH, Ven 7.09 (*)    pCO2, Ven 41 (*)    pO2, Ven <31.0 (*)    Bicarbonate 12.4 (*)    Acid-base deficit 16.9 (*)    All other components within normal limits  GLUCOSE, CAPILLARY - Abnormal; Notable for the following:    Glucose-Capillary 570 (*)    All other components within normal limits  URINALYSIS, COMPLETE (UACMP) WITH MICROSCOPIC  LIPASE, BLOOD  TROPONIN I  BETA-HYDROXYBUTYRIC ACID  CBG MONITORING, ED   ____________________________________________  FINAL ASSESSMENT AND PLAN  DKA   Plan: Patient's labs were dictated above. Patient had presented for weakness, hyperglycemia and vomiting consistent with DKA.  Patient is acidemic with a pH of 7.09. We have started her on IV fluids, antiemetics and an insulin drip. I will discuss with the hospitalist for admission.   Earleen Newport, MD   Note: This note was generated in part or whole with voice recognition software. Voice recognition is usually quite accurate but there are transcription errors that can and very often do occur. I apologize for any typographical errors that were not detected and corrected.     Earleen Newport, MD 07/31/17 2113

## 2017-07-31 NOTE — ED Notes (Signed)
Altha Harm, RN aware of assigned bed

## 2017-07-31 NOTE — H&P (Addendum)
History and Physical   [facility] - [place] [facility] Admission History and Physical [employer], D.O.    Patient Name: Emma Thomas MR#: [id number] Date of Birth: 12/28/1995 Date of Admission: 07/31/2017  Referring MD/NP/PA: Dr. Jimmye Norman Primary Care Physician: [facility]  Chief Complaint:  Chief Complaint  Patient presents with  . Hyperglycemia    HPI: Emma Thomas is a 21 y.o. female with a known history of IDDM presents to the emergency department for evaluation of hyperglycemia, weakness, lethargy.  Patient was in a usual state of health until two days ago when she began feeling lethargic and nauseous with some vomiting.  States that she takes her insulin intermittently and is not compliant with a diabetic diet.    Patient denies, dizziness, chest pain, shortness of breath, N/V/C/D, abdominal pain, dysuria/frequency, changes in mental status.    Otherwise there has been no change in statust.  No recent antibiotics.  There has been no recent illness, hospitalizations, travel or sick contacts.    EMS/ED Course: Patient received IV insulin, morphine, Zofran, lidoderm, Flexeril and NS. Medical admission has been requested for further management of DKA.  Review of Systems:  CONSTITUTIONAL: Positive fever/chills, fatigue, weakness,  negative weight gain/loss, headache. EYES: No blurry or double vision. ENT: No tinnitus, postnasal drip, redness or soreness of the oropharynx. RESPIRATORY: No cough, dyspnea, wheeze.  No hemoptysis.  CARDIOVASCULAR: No chest pain, palpitations, syncope, orthopnea. No lower extremity edema.  GASTROINTESTINAL: Positive nausea, vomiting, negative abdominal pain, diarrhea, constipation.  No hematemesis, melena or hematochezia. GENITOURINARY: No dysuria, frequency, hematuria. ENDOCRINE: No polyuria or nocturia. No heat or cold intolerance. HEMATOLOGY: No anemia, bruising, bleeding. INTEGUMENTARY: No rashes, ulcers,  lesions. MUSCULOSKELETAL: Positive back pain, No arthritis, gout, dyspnea. NEUROLOGIC: No numbness, tingling, ataxia, seizure-type activity, weakness. PSYCHIATRIC: No anxiety, depression, insomnia.   Past Medical History:  Diagnosis Date  . Diabetes mellitus without complication ([place])    Type 1 DM    Past Surgical History:  Procedure Laterality Date  . abscess removal       reports that she has never smoked. She has never used smokeless tobacco. She reports that she drinks about 1.2 oz of alcohol per week . She reports that she does not use drugs.  No Known Allergies  Family History  Problem Relation Age of Onset  . Breast cancer Mother   . Diabetes type II Brother     Prior to Admission medications   Medication Sig Start Date End Date Taking? Authorizing Provider  HUMALOG KWIKPEN 100 UNIT/ML KiwkPen Inject 8 Units into the skin 3 (three) times daily. 07/04/17  Yes [provider]  LEVEMIR FLEXTOUCH 100 UNIT/ML Pen Inject 16 Units into the skin 2 (two) times daily. 06/19/17  Yes [provider]  Levonorgestrel (SKYLA IU) 1 Device by Intrauterine route.   Yes [provider]  SUMAtriptan (IMITREX) 50 MG tablet Take 1 tablet by mouth as needed. 06/11/17  Yes [provider]  divalproex (DEPAKOTE ER) 500 MG 24 hr tablet Take 500 mg by mouth daily. 01/22/17 01/22/18  [provider]    Physical Exam: Vitals:   07/31/17 2100 07/31/17 2109 07/31/17 2130 07/31/17 2145  BP: (!) 100/52 (!) 103/53 (!) 97/46 (!) 99/48  Pulse: (!) 109 (!) 109 (!) 105 (!) 105  Resp:  (!) 26  (!) 21  Temp:      TempSrc:      SpO2: 100% 100% 100% 100%  Weight:  Height:        GENERAL: 21 y.o.-year-old female patient, well-developed, well-nourished lying in the bed in no acute distress.  Pleasant and cooperative.   HEENT: Head atraumatic, normocephalic. Pupils equal. Mucus membranes dry NECK: Supple, full range of motion. No JVD, no bruit heard. No thyroid  enlargement, no tenderness, no cervical lymphadenopathy. CHEST: Normal breath sounds bilaterally. No wheezing, rales, rhonchi or crackles. No use of accessory muscles of respiration.  No reproducible chest wall tenderness.  CARDIOVASCULAR: S1, S2 normal. No murmurs, rubs, or gallops. Cap refill <2 seconds. Pulses intact distally.  ABDOMEN: Soft, nondistended, nontender. No rebound, guarding, rigidity. Normoactive bowel sounds present in all four quadrants.  EXTREMITIES: No pedal edema, cyanosis, or clubbing. No calf tenderness or Homan's sign.  NEUROLOGIC: The patient is alert and oriented x 3. Cranial nerves II through XII are grossly intact with no focal sensorimotor deficit. PSYCHIATRIC:  Normal affect, mood, thought content. SKIN: Warm, dry, and intact without obvious rash, lesion, or ulcer.    Labs on Admission:  CBC:  Recent Labs Lab 07/31/17 2029  WBC 18.9*  HGB 14.0  HCT 42.5  MCV 94.8  PLT 858   Basic Metabolic Panel:  Recent Labs Lab 07/31/17 2029  NA 141  K 5.0  CL 99*  CO2 13*  GLUCOSE 649*  BUN 26*  CREATININE 1.32*  CALCIUM 10.0   GFR: Estimated Creatinine Clearance: 62.6 mL/min (A) (by C-G formula based on SCr of 1.32 mg/dL (H)). Liver Function Tests: No results for input(s): AST, ALT, ALKPHOS, BILITOT, PROT, ALBUMIN in the last 168 hours.  Recent Labs Lab 07/31/17 2029  LIPASE 12   No results for input(s): AMMONIA in the last 168 hours. Coagulation Profile: No results for input(s): INR, PROTIME in the last 168 hours. Cardiac Enzymes:  Recent Labs Lab 07/31/17 2029  TROPONINI <0.03   BNP (last 3 results) No results for input(s): PROBNP in the last 8760 hours. HbA1C: No results for input(s): HGBA1C in the last 72 hours. CBG:  Recent Labs Lab 07/31/17 2023 07/31/17 2127  GLUCAP 570* >600*   Lipid Profile: No results for input(s): CHOL, HDL, LDLCALC, TRIG, CHOLHDL, LDLDIRECT in the last 72 hours. Thyroid Function Tests: No results  for input(s): TSH, T4TOTAL, FREET4, T3FREE, THYROIDAB in the last 72 hours. Anemia Panel: No results for input(s): VITAMINB12, FOLATE, FERRITIN, TIBC, IRON, RETICCTPCT in the last 72 hours. Urine analysis:    Component Value Date/Time   COLORURINE STRAW (A) 01/08/2017 1852   APPEARANCEUR CLEAR (A) 01/08/2017 1852   LABSPEC 1.024 01/08/2017 1852   PHURINE 6.0 01/08/2017 1852   GLUCOSEU >=500 (A) 01/08/2017 1852   HGBUR LARGE (A) 01/08/2017 1852   BILIRUBINUR NEGATIVE 01/08/2017 [place] 01/08/2017 1852   PROTEINUR 30 (A) 01/08/2017 1852   NITRITE NEGATIVE 01/08/2017 1852   LEUKOCYTESUR TRACE (A) 01/08/2017 1852   Sepsis Labs: @LABRCNTIP (procalcitonin:4,lacticidven:4) )No results found for this or any previous visit (from the past 240 hour(s)).   Radiological Exams on Admission: No results found.  EKG: Sinus tach at 109 bpm with normal axis and nonspecific ST-T wave changes.   Assessment/Plan  This is a 21 y.o. female with a history of IDDM now being admitted with:  #. DKA 2/2 noncompliance - Admit stepdown - Insulin drip per protocol - IV fluid hydratikon with K replacement - BMP q4h - Trend l;actate - Check UA and urine HCG - CCM consulted d/w ELink - Diabetes coordinator has been consulted.  #. AKI 2/2  above - IV fluid hydration - Follow BMP  Admission status: Inpatient, stepdown IV Fluids: NS, KCl runs Diet/Nutrition: NPO Consults called: CCM  DVT Px: SCDs and early ambulation. Code Status: Full Code  Disposition Plan: To home in 1-2 days  All the records are reviewed and case discussed with ED provider. Management plans discussed with the patient and/or family who express understanding and agree with plan of care.  Lorik Guo D.O. on 07/31/2017 at 10:17 PM Between 7am to 6pm - Pager - [phone] After 6pm go to [email or web address] - [employer] [facility] [place] [facility] Office [phone] CC: Primary care  physician; [facility]   07/31/2017, 10:17 PM

## 2017-08-01 DIAGNOSIS — E1010 Type 1 diabetes mellitus with ketoacidosis without coma: Principal | ICD-10-CM

## 2017-08-01 LAB — GLUCOSE, CAPILLARY
GLUCOSE-CAPILLARY: 133 mg/dL — AB (ref 65–99)
GLUCOSE-CAPILLARY: 135 mg/dL — AB (ref 65–99)
GLUCOSE-CAPILLARY: 173 mg/dL — AB (ref 65–99)
GLUCOSE-CAPILLARY: 207 mg/dL — AB (ref 65–99)
GLUCOSE-CAPILLARY: 308 mg/dL — AB (ref 65–99)
GLUCOSE-CAPILLARY: 57 mg/dL — AB (ref 65–99)
Glucose-Capillary: 114 mg/dL — ABNORMAL HIGH (ref 65–99)
Glucose-Capillary: 122 mg/dL — ABNORMAL HIGH (ref 65–99)
Glucose-Capillary: 123 mg/dL — ABNORMAL HIGH (ref 65–99)
Glucose-Capillary: 133 mg/dL — ABNORMAL HIGH (ref 65–99)
Glucose-Capillary: 143 mg/dL — ABNORMAL HIGH (ref 65–99)
Glucose-Capillary: 181 mg/dL — ABNORMAL HIGH (ref 65–99)
Glucose-Capillary: 233 mg/dL — ABNORMAL HIGH (ref 65–99)
Glucose-Capillary: 240 mg/dL — ABNORMAL HIGH (ref 65–99)
Glucose-Capillary: 286 mg/dL — ABNORMAL HIGH (ref 65–99)
Glucose-Capillary: 91 mg/dL (ref 65–99)

## 2017-08-01 LAB — BASIC METABOLIC PANEL
Anion gap: 8 (ref 5–15)
Anion gap: 4 — ABNORMAL LOW (ref 5–15)
Anion gap: 6 (ref 5–15)
BUN: 23 mg/dL — AB (ref 6–20)
BUN: 22 mg/dL — AB (ref 6–20)
BUN: 19 mg/dL (ref 6–20)
CALCIUM: 8.5 mg/dL — AB (ref 8.9–10.3)
CHLORIDE: 116 mmol/L — AB (ref 101–111)
CO2: 20 mmol/L — AB (ref 22–32)
CO2: 23 mmol/L (ref 22–32)
CO2: 23 mmol/L (ref 22–32)
CREATININE: 0.75 mg/dL (ref 0.44–1.00)
CREATININE: 0.55 mg/dL (ref 0.44–1.00)
CREATININE: 0.63 mg/dL (ref 0.44–1.00)
Calcium: 8.6 mg/dL — ABNORMAL LOW (ref 8.9–10.3)
Calcium: 8.6 mg/dL — ABNORMAL LOW (ref 8.9–10.3)
Chloride: 115 mmol/L — ABNORMAL HIGH (ref 101–111)
Chloride: 114 mmol/L — ABNORMAL HIGH (ref 101–111)
GFR calc Af Amer: >60 mL/min (ref >60)
GFR calc Af Amer: >60 mL/min (ref >60)
GFR calc Af Amer: >60 mL/min (ref >60)
GFR calc non Af Amer: >60 mL/min (ref >60)
GFR calc non Af Amer: >60 mL/min (ref >60)
GFR calc non Af Amer: >60 mL/min (ref >60)
GLUCOSE: 163 mg/dL — AB (ref 65–99)
GLUCOSE: 141 mg/dL — AB (ref 65–99)
Glucose, Bld: 258 mg/dL — ABNORMAL HIGH (ref 65–99)
POTASSIUM: 4.3 mmol/L (ref 3.5–5.1)
Potassium: 4 mmol/L (ref 3.5–5.1)
Potassium: 4 mmol/L (ref 3.5–5.1)
Sodium: 144 mmol/L (ref 135–145)
Sodium: 142 mmol/L (ref 135–145)
Sodium: 143 mmol/L (ref 135–145)

## 2017-08-01 LAB — URINE DRUG SCREEN, QUALITATIVE (ARMC ONLY)
AMPHETAMINES, UR SCREEN: NOT DETECTED
BENZODIAZEPINE, UR SCRN: NOT DETECTED
Barbiturates, Ur Screen: NOT DETECTED
COCAINE METABOLITE, UR ~~LOC~~: NOT DETECTED
Cannabinoid 50 Ng, Ur ~~LOC~~: NOT DETECTED
MDMA (Ecstasy)Ur Screen: NOT DETECTED
METHADONE SCREEN, URINE: NOT DETECTED
OPIATE, UR SCREEN: POSITIVE — AB
PHENCYCLIDINE (PCP) UR S: NOT DETECTED
Tricyclic, Ur Screen: NOT DETECTED

## 2017-08-01 LAB — HEMOGLOBIN A1C
HEMOGLOBIN A1C: 12.2 % — AB (ref 4.8–5.6)
Mean Plasma Glucose: 303.44 mg/dL

## 2017-08-01 LAB — MRSA PCR SCREENING: MRSA by PCR: NEGATIVE

## 2017-08-01 MED ORDER — METOCLOPRAMIDE HCL 5 MG/ML IJ SOLN
5.0000 mg | Freq: Four times a day (QID) | INTRAMUSCULAR | Status: DC | PRN
  Administered 2017-08-01: 5 mg via INTRAVENOUS
  Filled 2017-08-01: qty 2

## 2017-08-01 MED ORDER — INSULIN LISPRO 100 UNIT/ML (KWIKPEN): 8.0000 [IU] | PEN_INJECTOR | Freq: Three times a day (TID) | SUBCUTANEOUS | Status: DC

## 2017-08-01 MED ORDER — SODIUM CHLORIDE 0.9 % IV SOLN
INTRAVENOUS | Status: DC
  Administered 2017-08-01: 100 mL/h via INTRAVENOUS

## 2017-08-01 MED ORDER — INSULIN ASPART 100 UNIT/ML ~~LOC~~ SOLN: 8.0000 [IU] | Freq: Three times a day (TID) | SUBCUTANEOUS | Status: DC

## 2017-08-01 MED ORDER — INSULIN ASPART 100 UNIT/ML ~~LOC~~ SOLN
0.0000 [IU] | SUBCUTANEOUS | Status: DC
  Administered 2017-08-01: 4 [IU] via SUBCUTANEOUS
  Filled 2017-08-01: qty 1

## 2017-08-01 MED ORDER — LIVING WELL WITH DIABETES BOOK
Freq: Once | Status: AC
  Administered 2017-08-02
  Filled 2017-08-01: qty 1

## 2017-08-01 MED ORDER — INSULIN ASPART 100 UNIT/ML ~~LOC~~ SOLN: 0.0000 [IU] | SUBCUTANEOUS | Status: DC

## 2017-08-01 MED ORDER — INSULIN ASPART 100 UNIT/ML ~~LOC~~ SOLN
0.0000 [IU] | SUBCUTANEOUS | Status: DC
  Administered 2017-08-01: 3 [IU] via SUBCUTANEOUS
  Filled 2017-08-01: qty 1

## 2017-08-01 MED ORDER — INSULIN DETEMIR 100 UNIT/ML ~~LOC~~ SOLN
16.0000 [IU] | Freq: Two times a day (BID) | SUBCUTANEOUS | Status: DC
  Administered 2017-08-01: 16 [IU] via SUBCUTANEOUS
  Filled 2017-08-01 (×3): qty 0.16

## 2017-08-01 MED ORDER — INSULIN DETEMIR 100 UNIT/ML FLEXPEN: 16.0000 [IU] | PEN_INJECTOR | Freq: Two times a day (BID) | SUBCUTANEOUS | Status: DC

## 2017-08-01 NOTE — Progress Notes (Signed)
FSBS 57. Pt given 8 ounces to drink. Will recheck CBG in 15 minutes

## 2017-08-01 NOTE — Progress Notes (Signed)
1000 late note. Attempted to discuss patient's type 1 diabetes. Patient withdrawn and non interactive.1100 given menu and encouraged to select her preferences. Patient stated she was not hungry. Patient is sucking her thumb.

## 2017-08-01 NOTE — Progress Notes (Signed)
Patient admitted for hyperglycemia--patient also has active vomiting and was ordered compazine and reglan.  Spoke to NP about patient's QT being 520 on EKG; but no other therapies are working and NP is concerned about gastroparesis.  NP informed of concern and will recheck pt's QTc in the am and if QTc worsens will d/c therapy and try something else.  Tobie Lords, PharmD, BCPS Clinical Pharmacist 08/01/2017

## 2017-08-01 NOTE — Progress Notes (Signed)
Repeat CBG 91 after 8 ounces of juice

## 2017-08-01 NOTE — Progress Notes (Signed)
PULMONARY / CRITICAL CARE MEDICINE   Name: Emma Thomas MRN: [id number] DOB: 1996-03-27    ADMISSION DATE:  07/31/2017   CONSULTATION DATE:  07/31/17  REFERRING MD:  Dr Hugelmelyer  REASON: DKA   CHIEF COMPLAINT:  Nausea and vomiting  HISTORY OF PRESENT ILLNESS:   21 y/o AA female with a history of type 1 diabetes who presented to the emergency room with a one-day history of nausea and vomiting. Patient states that she has not been very compliant with her insulin. This morning his she started having severe nausea and vomiting and symptoms got worse during the course of the day hence she decided to come to the emergency room. She is currently on an insulin drip and her blood sugars are still in the 400s. She  reports persistent nausea but no vomiting.  SUBJECTIVE: Persistent nausea and vomiting. Given 1 dose of Reglan. Elevated QTC at baseline. Anion gap closed and blood glucose level trending. Patient is sleeping comfortably   VITAL SIGNS: BP (!) 99/54   Pulse (!) 106   Temp 98.2 F (36.8 C) (Oral)   Resp 19   Ht 5\' 6"  (1.676 m)   Wt 139 lb (63 kg)   SpO2 97%   BMI 22.44 kg/m   HEMODYNAMICS:    VENTILATOR SETTINGS:    INTAKE / OUTPUT: No intake/output data recorded.  PHYSICAL EXAMINATION: General: Acutely ill-looking Neuro:  Alert and oriented 4, no focal deficits HEENT:  PERRLA, trachea midline, no JVD, oral mucosa dry Cardiovascular: Tachycardic with a heart rate of 112, S1, S2, no murmur, regurg or gallop, no edema Lungs: Clear to auscultation bilaterally Abdomen:  Nondistended, normal bowel sounds, palpation reveals no organomegaly Musculoskeletal:  No deformities, positive range of motion Skin:  Warm and dry, poor skin turgor  LABS:  BMET  Recent Labs Lab 07/31/17 2029 07/31/17 2302 08/01/17 0308  NA 141 144 144  K 5.0 4.2 4.3  CL 99* 110 116*  CO2 13* 14* 20*  BUN 26* 24* 23*  CREATININE 1.32* 1.15* 0.75  GLUCOSE 649* 440* 258*     Electrolytes  Recent Labs Lab 07/31/17 2029 07/31/17 2302 08/01/17 0308  CALCIUM 10.0 8.7* 8.6*  MG  --  2.0  --   PHOS  --  4.5  --     CBC  Recent Labs Lab 07/31/17 2029  WBC 18.9*  HGB 14.0  HCT 42.5  PLT 401    Coag's No results for input(s): APTT, INR in the last 168 hours.  Sepsis Markers No results for input(s): LATICACIDVEN, PROCALCITON, O2SATVEN in the last 168 hours.  ABG No results for input(s): PHART, PCO2ART, PO2ART in the last 168 hours.  Liver Enzymes No results for input(s): AST, ALT, ALKPHOS, BILITOT, ALBUMIN in the last 168 hours.  Cardiac Enzymes  Recent Labs Lab 07/31/17 2029  TROPONINI <0.03    Glucose  Recent Labs Lab 08/01/17 0053 08/01/17 0204 08/01/17 0251 08/01/17 0405 08/01/17 0454 08/01/17 0557  GLUCAP 308* 286* 240* 233* 207* 181*    Imaging No results found.   CULTURES: Urine culture pending   SIGNIFICANT EVENTS: 07/31/2017: Admitted  LINES/TUBES: Peripheral IVs  DISCUSSION: 21 year old A female presenting with DKA, anion gap 29, and a blood glucose level of 649 mg/dL and intractable nausea and vomiting  ASSESSMENT DKA Intractable nausea and vomiting. AKI PLAN Continue IV insulin per protocol and transitioned to subcutaneous insulin as indicated Continue IV fluids Consult diabetes education. Monitor and correct electrolytes. Compazine and Reglan when necessary  for nausea and vomiting. EKG when necessary Resume regular diet, was nausea and vomiting subsides DVT prophylaxis  FAMILY  - Updates: Patient updated on current treatment plan  - Inter-disciplinary family meet or Palliative Care meeting due by:  day [facility] ANP-BC Pulmonary and Critical Care Medicine [facility] Pager [phone] or [phone]  08/01/2017, 6:20 AM

## 2017-08-01 NOTE — Progress Notes (Signed)
[place] [facility] [place] NAME: Emma Thomas    MR#:  [id number]  DATE OF BIRTH:  07/14/1996  SUBJECTIVE: Admitted because of DKA, started on insulin drip, IV fluids. Anion gap 20 on admission. Today 6.. Patient feels better, no nausea.Marland Kitchen   CHIEF COMPLAINT:   Chief Complaint  Patient presents with  . Hyperglycemia    REVIEW OF SYSTEMS:   ROS CONSTITUTIONAL: No fever, fatigue or weakness.  EYES: No blurred or double vision.  EARS, NOSE, AND THROAT: No tinnitus or ear pain.  RESPIRATORY: No cough, shortness of breath, wheezing or hemoptysis.  CARDIOVASCULAR: No chest pain, orthopnea, edema.  GASTROINTESTINAL: No nausea, vomiting, diarrhea or abdominal pain.  GENITOURINARY: No dysuria, hematuria.  ENDOCRINE: No polyuria, nocturia,  HEMATOLOGY: No anemia, easy bruising or bleeding SKIN: No rash or lesion. MUSCULOSKELETAL: No joint pain or arthritis.   NEUROLOGIC: No tingling, numbness, weakness.  PSYCHIATRY: No anxiety or depression.   DRUG ALLERGIES:  No Known Allergies  VITALS:  Blood pressure 107/64, pulse (!) 110, temperature 98.2 F (36.8 C), temperature source Oral, resp. rate 17, height 5\' 6"  (1.676 m), weight 63 kg (139 lb), SpO2 99 %.  PHYSICAL EXAMINATION:  GENERAL:  21 y.o.-year-old patient lying in the bed with no acute distress.  EYES: Pupils equal, round, reactive to light and accommodation. No scleral icterus. Extraocular muscles intact.  HEENT: Head atraumatic, normocephalic. Oropharynx and nasopharynx clear.  NECK:  Supple, no jugular venous distention. No thyroid enlargement, no tenderness.  LUNGS: Normal breath sounds bilaterally, no wheezing, rales,rhonchi or crepitation. No use of accessory muscles of respiration.  CARDIOVASCULAR: S1, S2 normal. No murmurs, rubs, or gallops.  ABDOMEN: Soft, nontender, nondistended. Bowel sounds present. No organomegaly or mass.  EXTREMITIES: No pedal edema, cyanosis, or  clubbing.  NEUROLOGIC: Cranial nerves II through XII are intact. Muscle strength 5/5 in all extremities. Sensation intact. Gait not checked.  PSYCHIATRIC: The patient is alert and oriented x 3.  SKIN: No obvious rash, lesion, or ulcer.    LABORATORY PANEL:   CBC  Recent Labs Lab 07/31/17 2029  WBC 18.9*  HGB 14.0  HCT 42.5  PLT 401   ------------------------------------------------------------------------------------------------------------------  Chemistries   Recent Labs Lab 07/31/17 2302  08/01/17 1023  NA 144  < > 143  K 4.2  < > 4.0  CL 110  < > 114*  CO2 14*  < > 23  GLUCOSE 440*  < > 141*  BUN 24*  < > 19  CREATININE 1.15*  < > 0.63  CALCIUM 8.7*  < > 8.6*  MG 2.0  --   --   < > = values in this interval not displayed. ------------------------------------------------------------------------------------------------------------------  Cardiac Enzymes  Recent Labs Lab 07/31/17 2029  TROPONINI <0.03   ------------------------------------------------------------------------------------------------------------------  RADIOLOGY:  No results found.  EKG:   Orders placed or performed during the hospital encounter of 07/31/17  . ED EKG  . ED EKG  . EKG 12-Lead  . EKG 12-Lead    ASSESSMENT AND PLAN:   21 year old female patient with type 1 diabetes mellitus admitted for DKA with weakness, nausea. Received IV insulin, IV fluids. Patient remained gap is closed now. Transition from insulin drip to Levemir, Humalog. Transfer out of ICU, start her on diabetic diet. # #2 sinus tachycardia due to dehydration: Continue IV fluids with normal 700 cc an hour. Likely discharge tomorrow home. #3. diabetes mellitus type 1 and noncompliance with medicines at home.  All the records are reviewed and case discussed with Care Management/Social Workerr. Management plans discussed with the patient, family and they are in agreement.  CODE STATUS: Full code  TOTAL  TIME TAKING CARE OF THIS PATIENT: 35 minutes.   POSSIBLE D/C IN 1 DAYDEPENDING ON CLINICAL CONDITION.   Epifanio Lesches M.D on 08/01/2017 at 11:03 AM  Between 7am to 6pm - Pager - [phone]  After 6pm go to [email or web address] - password [email or web address] [place] [facility]  Office  [phone]  CC: Primary care physician; [facility]   Note: This dictation was prepared with Dragon dictation along with smaller phrase technology. Any transcriptional errors that result from this process are unintentional.

## 2017-08-02 LAB — GLUCOSE, CAPILLARY
Glucose-Capillary: 79 mg/dL (ref 65–99)
Glucose-Capillary: 94 mg/dL (ref 65–99)

## 2017-08-02 MED ORDER — INSULIN ASPART 100 UNIT/ML ~~LOC~~ SOLN: 0.0000 [IU] | Freq: Every day | SUBCUTANEOUS | Status: DC

## 2017-08-02 MED ORDER — INSULIN ASPART 100 UNIT/ML ~~LOC~~ SOLN: 0.0000 [IU] | Freq: Three times a day (TID) | SUBCUTANEOUS | Status: DC

## 2017-08-02 MED ORDER — INSULIN ASPART 100 UNIT/ML ~~LOC~~ SOLN: 0.0000 [IU] | Freq: Every day | SUBCUTANEOUS | 11 refills | Status: DC

## 2017-08-02 MED ORDER — LEVEMIR FLEXTOUCH 100 UNIT/ML ~~LOC~~ SOPN: 16.0000 [IU] | PEN_INJECTOR | Freq: Every day | SUBCUTANEOUS | 1 refills | Status: DC

## 2017-08-02 MED ORDER — HUMALOG KWIKPEN 100 UNIT/ML ~~LOC~~ SOPN: 6.0000 [IU] | PEN_INJECTOR | Freq: Three times a day (TID) | SUBCUTANEOUS | 11 refills | Status: DC

## 2017-08-02 MED ORDER — HUMALOG KWIKPEN 100 UNIT/ML ~~LOC~~ SOPN: 4.0000 [IU] | PEN_INJECTOR | Freq: Three times a day (TID) | SUBCUTANEOUS | 11 refills | Status: DC

## 2017-08-02 MED ORDER — INSULIN ASPART 100 UNIT/ML ~~LOC~~ SOLN: 0.0000 [IU] | Freq: Three times a day (TID) | SUBCUTANEOUS | 11 refills | Status: DC

## 2017-08-02 NOTE — Progress Notes (Signed)
Discharge summary reviewed with verbal understanding. Additional education regarding insulin coverage. Medication returned from Rx and patient placed into backpack. Ambulated to personal vehicle.

## 2017-08-02 NOTE — Discharge Summary (Signed)
Emma Thomas, is a 21 y.o. female  DOB 1996/01/18  MRN [id number].  Admission date:  07/31/2017  Admitting Physician  Harvie Bridge, DO  Discharge Date:  08/02/2017   Primary MD  [facility]  Recommendations for primary care physician for things to follow:   Follow with with PCP in one week   Admission Diagnosis  Diabetic ketoacidosis without coma associated with type 1 diabetes mellitus ([place]) [E10.10]   Discharge Diagnosis  Diabetic ketoacidosis without coma associated with type 1 diabetes mellitus ([place]) [E10.10]    Active Problems:   DKA (diabetic ketoacidoses) ([place])      Past Medical History:  Diagnosis Date  . Diabetes mellitus without complication ([place])    Type 1 DM    Past Surgical History:  Procedure Laterality Date  . abscess removal         History of present illness and  Hospital Course:     Kindly see H&P for history of present illness and admission details, please review complete Labs, Consult reports and Test reports for all details in brief  HPI  from the history and physical done on the day of admission  21 year old female patient admitted to ICU for DKA. Patient felt nauseous and weak, found to have DKA with anion gap 20,sugar was 570 in [place]  #1 DKA in type 1 diabetes mellitus. Admitted to intensive care unit, started on insulin drip, aggressive hydration with normal saline. Patient also received nausea medicines. DKA resolved, anion gap closed to 7.Nausea also improved.  Started on carb controlled diet. Patient is converted to Lantus, sliding scale insulin. And transfer from ICU to regular floor. Today morning patient is seen by diabetic nurse. Patient had an episode of hypoglycemia last night at 8 PM. Received orange juice, did not have any further  hypo-glycemia episodes.patient presents hemoglobin A1c 12.2 on September 15. Indicating poor control of diabetes. Because of episode of hypoglycemia last night we told the patient that she can take Levemir 16 units daily instead of twice daily that she takes, continue NovoLog 4 units 3 times a day with meals for meal coverage if patient eats less than 50% of meals. Continue NovoLog correction to Sensitivity scale 0-9 units 3 times a day with meals, NovoLog 0-5 units daily at bedtime.   she understood all the medication  Instructions.   Discharge Condition: stable   Follow UP  Follow-up Information    [place], [facility]. Go on 08/09/2017.   Why:  at 10:20am. Please arrive 15 minutes prior to appointment Contact information: [address] [place] [address] [phone]             Discharge Instructions  and  Discharge Medications      Allergies as of 08/02/2017   No Known Allergies     Medication List    TAKE these medications   divalproex 500 MG 24 hr tablet Commonly known as:  DEPAKOTE ER Take 500 mg by mouth daily.   HUMALOG KWIKPEN 100 UNIT/ML KiwkPen Generic drug:  insulin lispro Inject 0.04 mLs (4 Units total) into the skin 3 (three) times daily. What changed:  how much to take   insulin aspart 100 UNIT/ML injection Commonly known as:  novoLOG Inject 0-9 Units into the skin 3 (three) times daily with meals.   insulin aspart 100 UNIT/ML injection Commonly known as:  novoLOG Inject 0-5 Units into the skin at bedtime.   LEVEMIR FLEXTOUCH 100 UNIT/ML Pen Generic  drug:  Insulin Detemir Inject 16 Units into the skin daily at 8 pm. What changed:  when to take this   SKYLA IU 1 Device by Intrauterine route.   SUMAtriptan 50 MG tablet Commonly known as:  IMITREX Take 1 tablet by mouth as needed.            Discharge Care Instructions        Start     Ordered   08/02/17 0000  HUMALOG KWIKPEN 100 UNIT/ML KiwkPen  3 times daily      08/02/17 0807   08/02/17 0000  insulin aspart (NOVOLOG) 100 UNIT/ML injection  3 times daily with meals     08/02/17 0809   08/02/17 0000  insulin aspart (NOVOLOG) 100 UNIT/ML injection  Daily at bedtime     08/02/17 0809   08/02/17 0000  LEVEMIR FLEXTOUCH 100 UNIT/ML Pen  Daily     08/02/17 1014        Diet and Activity recommendation: See Discharge Instructions above   Consults obtained -critical care, diabetic nurse.   Major procedures and Radiology Reports - PLEASE review detailed and final reports for all details, in brief -      No results found.  Micro Results     Recent Results (from the past 240 hour(s))  MRSA PCR Screening     Status: None   Collection Time: 07/31/17 10:52 PM  Result Value Ref Range Status   MRSA by PCR NEGATIVE NEGATIVE Final    Comment:        The GeneXpert MRSA Assay (FDA approved for NASAL specimens only), is one component of a comprehensive MRSA colonization surveillance program. It is not intended to diagnose MRSA infection nor to guide or monitor treatment for MRSA infections.        Today   Subjective:   Emma Thomas today has no headache,no chest abdominal pain,no new weakness tingling or numbness, feels much better wants to go home today.   Objective:   Blood pressure 121/85, pulse 96, temperature 97.8 F (36.6 C), temperature source Oral, resp. rate 17, height 5\' 6"  (1.676 m), weight 63 kg (139 lb), SpO2 99 %.   Intake/Output Summary (Last 24 hours) at 08/02/17 1328 Last data filed at 08/02/17 0800  Gross per 24 hour  Intake           686.67 ml  Output                0 ml  Net           686.67 ml    Exam Awake Alert, Oriented x 3, No new F.N deficits, Normal affect [place].AT,PERRAL Supple Neck,No JVD, No cervical lymphadenopathy appriciated.  Symmetrical Chest wall movement, Good air movement bilaterally, CTAB RRR,No Gallops,Rubs or new Murmurs, No Parasternal Heave +ve B.Sounds, Abd Soft, Non tender, No  organomegaly appriciated, No rebound -guarding or rigidity. No Cyanosis, Clubbing or edema, No new Rash or bruise  Data Review   CBC w Diff: Lab Results  Component Value Date   WBC 18.9 (H) 07/31/2017   HGB 14.0 07/31/2017   HCT 42.5 07/31/2017   PLT 401 07/31/2017   LYMPHOPCT 8 07/30/2016   MONOPCT 7 07/30/2016   EOSPCT 0 07/30/2016   BASOPCT 1 07/30/2016    CMP: Lab Results  Component Value Date   NA 143 08/01/2017   K 4.0 08/01/2017   CL 114 (H) 08/01/2017   CO2 23 08/01/2017   BUN 19 08/01/2017   CREATININE 0.63  08/01/2017   PROT 8.1 06/08/2016   ALBUMIN 3.4 (L) 06/08/2016   BILITOT 1.9 (H) 06/08/2016   ALKPHOS 146 (H) 06/08/2016   AST 20 06/08/2016   ALT 11 (L) 06/08/2016  .   Total Time in preparing paper work, data evaluation and todays exam - 13 minutes  Tranice Laduke M.D on 08/02/2017 at 1:28 PM    Note: This dictation was prepared with Dragon dictation along with smaller phrase technology. Any transcriptional errors that result from this process are unintentional.

## 2017-08-02 NOTE — Progress Notes (Addendum)
Inpatient Diabetes Program Recommendations  AACE/ADA: New Consensus Statement on Inpatient Glycemic Control (2015)  Target Ranges:  Prepandial:   less than 140 mg/dL      Peak postprandial:   less than 180 mg/dL (1-2 hours)      Critically ill patients:  140 - 180 mg/dL   Results for Emma Thomas, Emma Thomas (MRN [id number]) as of 08/02/2017 07:36  Ref. Range 08/01/2017 06:52 08/01/2017 08:13 08/01/2017 09:22 08/01/2017 10:12 08/01/2017 11:13 08/01/2017 12:32 08/01/2017 16:04 08/01/2017 20:03 08/01/2017 20:43 08/01/2017 23:52 08/02/2017 04:08  Glucose-Capillary Latest Ref Range: 65 - 99 mg/dL 133 (H) 122 (H) 123 (H) 133 (H) 135 (H) 173 (H) 143 (H) 57 (L) 91 114 (H) 79   Results for Emma Thomas, Emma Thomas (MRN [id number]) as of 08/02/2017 07:36  Ref. Range 06/10/2016 04:25 07/31/2016 05:31 07/31/2017 23:02  Hemoglobin A1C Latest Ref Range: 4.8 - 5.6 % 14.0 (H) 15.2 (H) 12.2 (H)   Review of Glycemic Control  Diabetes history: DM1 (Patient does not make insulin and is sensitive to insulin; will require basal, correction, and meal coverage insulin) Outpatient Diabetes medications: Levemir 16 units BID, Humalog 8 units TID with meals Current orders for Inpatient glycemic control: Novolog 0-20 units Q4H  Inpatient Diabetes Program Recommendations:  Insulin - Basal: Noted Levemir was discontinued. Please reorder Levemir 16 units daily to start around noon today. Correction (SSI): Patient has Type 1 DM and is very sensitive to insulin. Please decrease Novolog correction to Sensitive scale (0-9 units) TID with meals and Novolog 0-5 units QHS. Insulin - Meal Coverage: Please consider ordering Novolog 4 units TID with meals for meal coverage if patient eats at least 50% of meals. A1C: A1C 12.2% on 07/31/17 indicating an average glucose of 303 mg/dl over the past 2-3 months.  NOTE: Initial glucose 649 mg/dl and patient admitted with DKA. Patient was transitioned off IV insulin to SQ insulin on 08/01/17. Patient received Levemir 16  units at 13:15 on 08/01/17. Patient has resistant Novolog correction ordered which is too aggressive to a person with Type 1 diabetes. Patient received Novolog 3 units at 16:39 on 08/01/17 and following CBG 57 mg/dl at 20:03. Hypoglycemia on 08/01/17 a result of too much Novolog insulin. One unit of Novolog can bring glucose down 40-50 mg/dl for a person with Type 1 DM. Per care everywhere, patient's last A1C was >14% on 04/21/17 and PCP office note states that patient was running out of Humalog before it was time for her to refill. Will plan to talk with patient today.  Addendum 08/02/17@8 :52-Spoke with patient over phone about diabetes and home regimen for diabetes control. Patient reports that she is followed by PCP for diabetes management and currently she takes Levemir 16 units BID, Humalog 8 units TID with meals as an outpatient for diabetes control. Patient reports that she is taking insulin as prescribed but admits that she does not consistently take Humalog insulin. Patient states that she checks her glucose and it is usually low 60-75 mg/dl in the morning and goes up in the 200-300's mg/dl during the day. Discussed A1C results (12.2% on 07/31/17) and explained that her current A1C indicates an average glucose of 303 mg/dl over the past 2-3 months. Discussed glucose and A1C goals. Discussed importance of checking CBGs and maintaining good CBG control to prevent long-term and short-term complications. Explained how hyperglycemia leads to damage within blood vessels which lead to the common complications seen with uncontrolled diabetes. Stressed to the patient the importance of improving glycemic control to  prevent further complications from uncontrolled diabetes especially since she is so young. Discussed impact of nutrition, exercise, stress, sickness, and medications on diabetes control. Noted in chart review, that patient last seen PCP 04/21/17 and PCP noted patient was running out of Humalog insulin. Patient  states that she has Levemir and Humalog insulin at home but when she refills her Humalog the pharmacy only gives her 2 Humalog pens for 1 month. Patient states that she ends up running out of Humalog before it is time to refill. Explained to the patient that her PCP may need to change the Humalog insulin dose to provide her enough for meal coverage and additional units for correction when needed. Discussed Levemir insulin and explained that if she is frequently waking up with low glucose then her insulin dosages need to be adjusted. Encouraged patient to take Humalog TID consistently along with Levemir insulin. Explained that she needs to check glucose 3-4 times per day and when she follows up with PCP she needs to provide glucose data so adjustments can be made with insulin regimen to get DM better controlled. Also encouraged patient to try to get re-established with an Endocrinologist which can also assist with improving DM control.  Patient verbalized understanding of information discussed and she states that she has no further questions at this time related to diabetes.  Thanks, Barnie Alderman, RN, MSN, CDE Diabetes Coordinator Inpatient Diabetes Program [phone] (Team Pager from 8am to 5pm)

## 2017-08-02 NOTE — Progress Notes (Signed)
     Emma Thomas was admitted to the Hospital on 07/31/2017 and Discharged  08/02/2017 and should be excused from work/school   for 3 days starting 07/31/2017 , may return to work/school without any restrictions.    Epifanio Lesches M.D on 08/02/2017,at 11:58 AM

## 2017-08-03 LAB — HIV ANTIBODY (ROUTINE TESTING W REFLEX): HIV Screen 4th Generation wRfx: NONREACTIVE

## 2017-08-16 ENCOUNTER — Emergency Department: Payer: 59

## 2017-08-16 ENCOUNTER — Encounter: Payer: Self-pay | Admitting: Emergency Medicine

## 2017-08-16 ENCOUNTER — Inpatient Hospital Stay
Admission: EM | Admit: 2017-08-16 | Discharge: 2017-08-18 | DRG: 872 | Disposition: A | Discharge status: Home / Self Care | Payer: 59 | Attending: Internal Medicine | Admitting: Internal Medicine

## 2017-08-16 DIAGNOSIS — Z833 Family history of diabetes mellitus: Secondary | ICD-10-CM | POA: Diagnosis not present

## 2017-08-16 DIAGNOSIS — E109 Type 1 diabetes mellitus without complications: Secondary | ICD-10-CM | POA: Diagnosis present

## 2017-08-16 DIAGNOSIS — K5900 Constipation, unspecified: Secondary | ICD-10-CM | POA: Diagnosis present

## 2017-08-16 DIAGNOSIS — A419 Sepsis, unspecified organism: Principal | ICD-10-CM | POA: Diagnosis present

## 2017-08-16 DIAGNOSIS — N12 Tubulo-interstitial nephritis, not specified as acute or chronic: Secondary | ICD-10-CM | POA: Diagnosis present

## 2017-08-16 DIAGNOSIS — Z794 Long term (current) use of insulin: Secondary | ICD-10-CM | POA: Diagnosis not present

## 2017-08-16 DIAGNOSIS — Z79899 Other long term (current) drug therapy: Secondary | ICD-10-CM

## 2017-08-16 LAB — URINALYSIS, ROUTINE W REFLEX MICROSCOPIC
Bilirubin Urine: NEGATIVE
Ketones, ur: 80 mg/dL — AB
NITRITE: POSITIVE — AB
PH: 5 (ref 5.0–8.0)
Protein, ur: 30 mg/dL — AB
Specific Gravity, Urine: 1.014 (ref 1.005–1.030)

## 2017-08-16 LAB — COMPREHENSIVE METABOLIC PANEL
ALK PHOS: 86 U/L (ref 38–126)
ALT: 12 U/L — AB (ref 14–54)
ANION GAP: 11 (ref 5–15)
AST: 15 U/L (ref 15–41)
Albumin: 3.8 g/dL (ref 3.5–5.0)
BILIRUBIN TOTAL: 1.9 mg/dL — AB (ref 0.3–1.2)
BUN: 11 mg/dL (ref 6–20)
CALCIUM: 9.3 mg/dL (ref 8.9–10.3)
CO2: 25 mmol/L (ref 22–32)
CREATININE: 0.63 mg/dL (ref 0.44–1.00)
Chloride: 98 mmol/L — ABNORMAL LOW (ref 101–111)
GFR calc non Af Amer: >60 mL/min (ref >60)
Glucose, Bld: 295 mg/dL — ABNORMAL HIGH (ref 65–99)
Potassium: 4.1 mmol/L (ref 3.5–5.1)
Sodium: 134 mmol/L — ABNORMAL LOW (ref 135–145)
TOTAL PROTEIN: 7.4 g/dL (ref 6.5–8.1)

## 2017-08-16 LAB — LACTIC ACID, PLASMA: Lactic Acid, Venous: 1 mmol/L (ref 0.5–1.9)

## 2017-08-16 LAB — PROTIME-INR
INR: 1.16
Prothrombin Time: 14.7 seconds (ref 11.4–15.2)

## 2017-08-16 LAB — CBC
HCT: 37.3 % (ref 35.0–47.0)
HEMOGLOBIN: 12.8 g/dL (ref 12.0–16.0)
MCH: 30.5 pg (ref 26.0–34.0)
MCHC: 34.3 g/dL (ref 32.0–36.0)
MCV: 88.8 fL (ref 80.0–100.0)
PLATELETS: 340 10*3/uL (ref 150–440)
RBC: 4.2 MIL/uL (ref 3.80–5.20)
RDW: 12.3 % (ref 11.5–14.5)
WBC: 8.8 10*3/uL (ref 3.6–11.0)

## 2017-08-16 LAB — PROCALCITONIN
Procalcitonin: <0.1 ng/mL
Procalcitonin: 0.17 ng/mL

## 2017-08-16 LAB — TROPONIN I: Troponin I: 0.03 ng/mL (ref <0.03)

## 2017-08-16 LAB — GLUCOSE, CAPILLARY: GLUCOSE-CAPILLARY: 225 mg/dL — AB (ref 65–99)

## 2017-08-16 LAB — LIPASE, BLOOD: Lipase: 11 U/L (ref 11–51)

## 2017-08-16 LAB — PREGNANCY, URINE: Preg Test, Ur: NEGATIVE

## 2017-08-16 MED ORDER — ONDANSETRON HCL 4 MG PO TABS
4.0000 mg | ORAL_TABLET | Freq: Four times a day (QID) | ORAL | Status: DC | PRN
  Administered 2017-08-18 (×2): 4 mg via ORAL
  Filled 2017-08-16 (×2): qty 1

## 2017-08-16 MED ORDER — ACETAMINOPHEN 500 MG PO TABS
1000.0000 mg | ORAL_TABLET | Freq: Once | ORAL | Status: AC
  Administered 2017-08-16: 1000 mg via ORAL
  Filled 2017-08-16 (×2): qty 2

## 2017-08-16 MED ORDER — ONDANSETRON HCL 4 MG/2ML IJ SOLN
4.0000 mg | Freq: Four times a day (QID) | INTRAMUSCULAR | Status: DC | PRN
  Administered 2017-08-17 (×2): 4 mg via INTRAVENOUS
  Filled 2017-08-16 (×2): qty 2

## 2017-08-16 MED ORDER — INSULIN DETEMIR 100 UNIT/ML ~~LOC~~ SOLN
16.0000 [IU] | Freq: Every day | SUBCUTANEOUS | Status: DC
  Administered 2017-08-16 – 2017-08-17 (×2): 16 [IU] via SUBCUTANEOUS
  Filled 2017-08-16 (×3): qty 0.16

## 2017-08-16 MED ORDER — ALBUTEROL SULFATE (2.5 MG/3ML) 0.083% IN NEBU: 2.5000 mg | INHALATION_SOLUTION | RESPIRATORY_TRACT | Status: DC | PRN

## 2017-08-16 MED ORDER — ACETAMINOPHEN 325 MG PO TABS
650.0000 mg | ORAL_TABLET | Freq: Four times a day (QID) | ORAL | Status: DC | PRN
  Administered 2017-08-17: 650 mg via ORAL
  Filled 2017-08-16: qty 2

## 2017-08-16 MED ORDER — SODIUM CHLORIDE 0.9 % IV BOLUS (SEPSIS)
1000.0000 mL | Freq: Once | INTRAVENOUS | Status: AC
  Administered 2017-08-16: 1000 mL via INTRAVENOUS

## 2017-08-16 MED ORDER — DIVALPROEX SODIUM ER 500 MG PO TB24
500.0000 mg | ORAL_TABLET | Freq: Every day | ORAL | Status: DC
  Administered 2017-08-17: 500 mg via ORAL
  Filled 2017-08-16: qty 1

## 2017-08-16 MED ORDER — SENNOSIDES-DOCUSATE SODIUM 8.6-50 MG PO TABS: 1.0000 | ORAL_TABLET | Freq: Every evening | ORAL | Status: DC | PRN

## 2017-08-16 MED ORDER — DEXTROSE 5 % IV SOLN
2.0000 g | INTRAVENOUS | Status: DC
  Filled 2017-08-16: qty 2

## 2017-08-16 MED ORDER — KETOROLAC TROMETHAMINE 30 MG/ML IJ SOLN
30.0000 mg | Freq: Four times a day (QID) | INTRAMUSCULAR | Status: DC | PRN
  Administered 2017-08-16 – 2017-08-17 (×2): 30 mg via INTRAVENOUS
  Filled 2017-08-16 (×2): qty 1

## 2017-08-16 MED ORDER — DEXTROSE 5 % IV SOLN
2.0000 g | Freq: Once | INTRAVENOUS | Status: AC
  Administered 2017-08-16: 2 g via INTRAVENOUS
  Filled 2017-08-16: qty 2

## 2017-08-16 MED ORDER — INSULIN ASPART 100 UNIT/ML ~~LOC~~ SOLN
0.0000 [IU] | Freq: Every day | SUBCUTANEOUS | Status: DC
  Administered 2017-08-16: 2 [IU] via SUBCUTANEOUS
  Filled 2017-08-16: qty 1

## 2017-08-16 MED ORDER — SODIUM CHLORIDE 0.9 % IV SOLN
INTRAVENOUS | Status: DC
  Administered 2017-08-16 – 2017-08-17 (×4): via INTRAVENOUS

## 2017-08-16 MED ORDER — SODIUM CHLORIDE 0.9 % IV BOLUS (SEPSIS)
2000.0000 mL | Freq: Once | INTRAVENOUS | Status: AC
  Administered 2017-08-16: 2000 mL via INTRAVENOUS

## 2017-08-16 MED ORDER — ACETAMINOPHEN 650 MG RE SUPP
650.0000 mg | Freq: Four times a day (QID) | RECTAL | Status: DC | PRN
  Filled 2017-08-16: qty 1

## 2017-08-16 MED ORDER — SODIUM CHLORIDE 0.9 % IV SOLN
INTRAVENOUS | Status: DC
  Administered 2017-08-17: 06:00:00 via INTRAVENOUS

## 2017-08-16 MED ORDER — MORPHINE SULFATE (PF) 4 MG/ML IV SOLN
4.0000 mg | Freq: Once | INTRAVENOUS | Status: AC
  Administered 2017-08-16: 4 mg via INTRAVENOUS
  Filled 2017-08-16: qty 1

## 2017-08-16 MED ORDER — INSULIN ASPART 100 UNIT/ML ~~LOC~~ SOLN
0.0000 [IU] | Freq: Three times a day (TID) | SUBCUTANEOUS | Status: DC
  Administered 2017-08-17: 2 [IU] via SUBCUTANEOUS
  Filled 2017-08-16: qty 1

## 2017-08-16 MED ORDER — CEFTRIAXONE SODIUM 2 G IJ SOLR
2.0000 g | INTRAMUSCULAR | Status: DC
  Administered 2017-08-17: 2 g via INTRAVENOUS
  Filled 2017-08-16 (×2): qty 2

## 2017-08-16 MED ORDER — ENOXAPARIN SODIUM 40 MG/0.4ML ~~LOC~~ SOLN
40.0000 mg | SUBCUTANEOUS | Status: DC
  Filled 2017-08-16: qty 0.4

## 2017-08-16 MED ORDER — ONDANSETRON HCL 4 MG/2ML IJ SOLN
4.0000 mg | Freq: Once | INTRAMUSCULAR | Status: AC
  Administered 2017-08-16: 4 mg via INTRAVENOUS
  Filled 2017-08-16: qty 2

## 2017-08-16 MED ORDER — HYDROCODONE-ACETAMINOPHEN 5-325 MG PO TABS
1.0000 | ORAL_TABLET | ORAL | Status: DC | PRN
  Administered 2017-08-16 (×2): 1 via ORAL
  Administered 2017-08-17 – 2017-08-18 (×6): 2 via ORAL
  Filled 2017-08-16: qty 1
  Filled 2017-08-16 (×6): qty 2
  Filled 2017-08-16: qty 1

## 2017-08-16 MED ORDER — INSULIN ASPART 100 UNIT/ML ~~LOC~~ SOLN
4.0000 [IU] | Freq: Three times a day (TID) | SUBCUTANEOUS | Status: DC
  Administered 2017-08-17: 4 [IU] via SUBCUTANEOUS
  Filled 2017-08-16 (×3): qty 1

## 2017-08-16 NOTE — ED Provider Notes (Signed)
[facility] Emergency Department Provider Note  ____________________________________________   First MD Initiated Contact with Patient 08/16/17 1756     (approximate)  I have reviewed the triage vital signs and the nursing notes.   HISTORY  Chief Complaint Flank Pain   HPI Emma Thomas is a 21 y.o. female who self presents to the emergency Department with roughly 1 week of dysuria frequency hesitancy that has now progressed to the right flank pain. Her pain is moderate to severe and aching. It is constant. It is nonradiating. Nothing in particular seems to make it better but movement makes it worse. She reports subjective fever. She has a history of type 1 diabetes mellitus that is poorly controlled.   Past Medical History:  Diagnosis Date  . Diabetes mellitus without complication ([place])    Type 1 DM    Patient Active Problem List   Diagnosis Date Noted  . Pyelonephritis 07/30/2016  . Sepsis ([place]) 06/08/2016  . DKA (diabetic ketoacidoses) ([place]) 09/09/2015    Past Surgical History:  Procedure Laterality Date  . abscess removal      Prior to Admission medications   Medication Sig Start Date End Date Taking? Authorizing Provider  HUMALOG KWIKPEN 100 UNIT/ML KiwkPen Inject 0.04 mLs (4 Units total) into the skin 3 (three) times daily. 08/02/17  Yes Epifanio Lesches, MD  insulin aspart (NOVOLOG) 100 UNIT/ML injection Inject 0-5 Units into the skin at bedtime. 08/02/17  Yes Epifanio Lesches, MD  LEVEMIR FLEXTOUCH 100 UNIT/ML Pen Inject 16 Units into the skin daily at 8 pm. 08/02/17  Yes Epifanio Lesches, MD  Levonorgestrel (SKYLA IU) 1 Device by Intrauterine route.   Yes [provider]  SUMAtriptan (IMITREX) 50 MG tablet Take 1 tablet by mouth as needed. 06/11/17  Yes [provider]  divalproex (DEPAKOTE ER) 500 MG 24 hr tablet Take 500 mg by mouth daily. 01/22/17 01/22/18  [provider]  insulin aspart (NOVOLOG)  100 UNIT/ML injection Inject 0-9 Units into the skin 3 (three) times daily with meals. Patient not taking: Reported on 08/16/2017 08/02/17   Epifanio Lesches, MD    Allergies [name].  Family History  Problem Relation Age of Onset  . Breast cancer Mother   . Diabetes type II Brother     Social History Social History  Substance Use Topics  . Smoking status: Never Smoker  . Smokeless tobacco: Never Used  . Alcohol use 1.2 oz/week    2 Shots of liquor per week    Review of Systems Constitutional: positive fevers and chills Eyes: No visual changes. ENT: No sore throat. Cardiovascular: Denies chest pain. Respiratory: Denies shortness of breath. Gastrointestinal: positive for abdominal pain.  positive for nausea, no vomiting.  No diarrhea.  No constipation. Genitourinary: positive for for dysuria. Musculoskeletal: Negative for back pain. Skin: Negative for rash. Neurological: Negative for headaches, focal weakness or numbness.   ____________________________________________   PHYSICAL EXAM:  VITAL SIGNS: ED Triage Vitals  Enc Vitals Group     BP 08/16/17 1622 135/88     Pulse Rate 08/16/17 1622 (!) 130     Resp 08/16/17 1622 12     Temp 08/16/17 1622 (!) 100.8 F (38.2 C)     Temp Source 08/16/17 1622 Oral     SpO2 08/16/17 1622 100 %     Weight 08/16/17 1623 135 lb (61.2 kg)     Height 08/16/17 1623 5\' 6"  (1.676 m)     Head Circumference --  Peak Flow --      Pain Score 08/16/17 1630 10     Pain Loc --      Pain Edu? --      Excl. in [place]? --     Constitutional: alert and oriented 4 appears quite uncomfortable curled onto her left side and tearful Eyes: PERRL EOMI. Head: Atraumatic. Nose: No congestion/rhinnorhea. Mouth/Throat: No trismus Neck: No stridor.   Cardiovascular: tachycardic rate, regular rhythm. Grossly normal heart sounds.  Good peripheral circulation. Respiratory: Normal respiratory effort.  No retractions. Lungs  CTAB and moving good air Gastrointestinal: soft nondistended moderate diffuse tenderness with no rebound no guarding no peritonitis and no focality exquisite right costovertebral tenderness Musculoskeletal: No lower extremity edema   Neurologic:  Normal speech and language. No gross focal neurologic deficits are appreciated. Skin:  Skin is warm, dry and intact. No rash noted. Psychiatric: Mood and affect are normal. Speech and behavior are normal.    ____________________________________________   DIFFERENTIAL includes but not limited to  pyelonephritis, sepsis, urinary tract infection, diabetic ketoacidosis ____________________________________________   LABS (all labs ordered are listed, but only abnormal results are displayed)  Labs Reviewed  COMPREHENSIVE METABOLIC PANEL - Abnormal; Notable for the following:       Result Value   Sodium 134 (*)    Chloride 98 (*)    Glucose, Bld 295 (*)    ALT 12 (*)    Total Bilirubin 1.9 (*)    All other components within normal limits  URINALYSIS, ROUTINE W REFLEX MICROSCOPIC - Abnormal; Notable for the following:    Color, Urine YELLOW (*)    APPearance CLEAR (*)    Glucose, UA >=500 (*)    Hgb urine dipstick MODERATE (*)    Ketones, ur 80 (*)    Protein, ur 30 (*)    Nitrite POSITIVE (*)    Leukocytes, UA TRACE (*)    Bacteria, UA FEW (*)    Squamous Epithelial / LPF 0-5 (*)    All other components within normal limits  TROPONIN I - Abnormal; Notable for the following:    Troponin I 0.03 (*)    All other components within normal limits  GLUCOSE, CAPILLARY - Abnormal; Notable for the following:    Glucose-Capillary 225 (*)    All other components within normal limits  CULTURE, BLOOD (ROUTINE X 2)  CULTURE, BLOOD (ROUTINE X 2)  URINE CULTURE  CBC  PREGNANCY, URINE  LACTIC ACID, PLASMA  PROTIME-INR  LIPASE, BLOOD  PROCALCITONIN  BASIC METABOLIC PANEL  CBC  PROCALCITONIN    blood work reviewed and interpreted by me shows  urinalysis consistent with infection. She has an elevated blood sugar but no signs of diabetic ketoacidosis. __________________________________________  EKG  ED ECG REPORT I, Darel Hong, the attending physician, personally viewed and interpreted this ECG.  Date: 08/16/2017 EKG Time:  Rate: 124 Rhythm: sinus tachycardia QRS Axis: normal Intervals: normal ST/T Wave abnormalities: normal Narrative Interpretation: no evidence of acute ischemia  ____________________________________________  RADIOLOGY  chest x-ray reviewed by me shows no acute disease ____________________________________________   PROCEDURES  Procedure(s) performed: no  Procedures  Critical Care performed: yes  CRITICAL CARE Performed by: Darel Hong   Total critical care time: 35 minutes  Critical care time was exclusive of separately billable procedures and treating other patients.  Critical care was necessary to treat or prevent imminent or life-threatening deterioration.  Critical care was time spent personally by me on the following activities: development of treatment plan with  patient and/or surrogate as well as nursing, discussions with consultants, evaluation of patient's response to treatment, examination of patient, obtaining history from patient or surrogate, ordering and performing treatments and interventions, ordering and review of laboratory studies, ordering and review of radiographic studies, pulse oximetry and re-evaluation of patient's condition.   Observation: no ____________________________________________   INITIAL IMPRESSION / ASSESSMENT AND PLAN / ED COURSE  Pertinent labs & imaging results that were available during my care of the patient were reviewed by me and considered in my medical decision making (see chart for details).  The patient arrives uncomfortable appearing febrile tachycardic with right flank pain and cloudy urine which is concerning for pyleonephritis.   Sepsis called in and ceftriaxone for now.     After 2 L of fluid an intravenous morphine the patient's heart rateremains markedly elevated. She has a very positive urine with significant costovertebral tenderness concerning for pyelonephritis injury sepsis. As the patient's symptoms have not resolved with treatment in the emergency department she requires inpatient admission for continued intravenous opioid pain medication as well as intravenous antibiotics and IV fluids. ____________________________________________   FINAL CLINICAL IMPRESSION(S) / ED DIAGNOSES  Final diagnoses:  Sepsis, due to unspecified organism [facility])  Pyelonephritis      NEW MEDICATIONS STARTED DURING THIS VISIT:  Current Discharge Medication List       Note:  This document was prepared using Dragon voice recognition software and may include unintentional dictation errors.     Darel Hong, MD 08/16/17 2244

## 2017-08-16 NOTE — ED Triage Notes (Addendum)
States recently hospitalized for DKA.  Arrives today with c/o low back and right flank pain and cloudy urine x 1 week.

## 2017-08-16 NOTE — ED Notes (Signed)
Lab called with a critical high troponin of 0.03 Dr. Mable Paris was notified.

## 2017-08-16 NOTE — Progress Notes (Signed)
Pharmacy Antibiotic Note  Emma Thomas is a 21 y.o. female admitted on 08/16/2017 with UTI.  Pharmacy has been consulted for ceftriaxone dosing.  Plan: Ceftriaxone 2 grams q 24 hours ordered.  Height: 5\' 6"  (167.6 cm) Weight: 135 lb (61.2 kg) IBW/kg (Calculated) : 59.3  Temp (24hrs), Avg:99.6 F (37.6 C), Min:98.7 F (37.1 C), Max:100.8 F (38.2 C)   Recent Labs Lab 08/16/17 1632 08/16/17 1829  WBC 8.8  --   CREATININE 0.63  --   LATICACIDVEN  --  1.0    Estimated Creatinine Clearance: 103.3 mL/min (by C-G formula based on SCr of 0.63 mg/dL).    No Known Allergies  Antimicrobials this admission: Ceftriaxone 10/1  >>    >>   Dose adjustments this admission:   Microbiology results: 10/1 BCx: pending 10/1 UCx: pending  9/15 MRSA PCR: (-)      10/1 UA: LE(tr) NO2(+) WBC 6-30 Thank you for allowing pharmacy to be a part of this patient's care.  Lyn Deemer S 08/16/2017 10:11 PM

## 2017-08-16 NOTE — ED Notes (Signed)
Patient transported to room 220.

## 2017-08-16 NOTE — ED Notes (Signed)
Mallie Mussel RN called lab to add the requested test. Lab said that they can add the newly added test to the blood already available in the lab.

## 2017-08-16 NOTE — Progress Notes (Signed)
RN Amy acknowledges she received Rocephin 2 gm and has it to administer.

## 2017-08-16 NOTE — H&P (Signed)
[place] [facility] [place] NAME: Emma Thomas    MR#:  [id number]  DATE OF BIRTH:  [birth year]  DATE OF ADMISSION:  08/16/2017  PRIMARY CARE PHYSICIAN: [facility]   REQUESTING/REFERRING PHYSICIAN: Darel Hong, MD  CHIEF COMPLAINT:   Chief Complaint  Patient presents with  . Flank Pain   Fever, chills, abdominal pain and right-sided flank pain since last night. HISTORY OF PRESENT ILLNESS:  Emma Thomas  is a 21 y.o. female with a known history of Diabetes 1, DKA and pyelonephritis. The patient presently ED with the above chief complaints. She complains of fever, chills, right sided flank pain since last night. She also complains of nausea and mild abdominal pain. She has dysuria and urinary frequency and urgency. She was found septic and UTI, treated with Rocephin in ED.  PAST MEDICAL HISTORY:   Past Medical History:  Diagnosis Date  . Diabetes mellitus without complication ([place])    Type 1 DM    PAST SURGICAL HISTORY:   Past Surgical History:  Procedure Laterality Date  . abscess removal      SOCIAL HISTORY:   Social History  Substance Use Topics  . Smoking status: Never Smoker  . Smokeless tobacco: Never Used  . Alcohol use 1.2 oz/week    2 Shots of liquor per week    FAMILY HISTORY:   Family History  Problem Relation Age of Onset  . Breast cancer Mother   . Diabetes type II Brother     DRUG ALLERGIES:  No Known Allergies  REVIEW OF SYSTEMS:   Review of Systems  Constitutional: Positive for chills and fever. Negative for malaise/fatigue.  HENT: Negative for sore throat.   Eyes: Negative for blurred vision and double vision.  Respiratory: Negative for cough, hemoptysis, shortness of breath, wheezing and stridor.   Cardiovascular: Negative for chest pain, palpitations, orthopnea and leg swelling.  Gastrointestinal: Positive for abdominal pain and nausea. Negative for blood in stool, diarrhea,  melena and vomiting.  Genitourinary: Positive for dysuria, flank pain, frequency and urgency. Negative for hematuria.  Musculoskeletal: Negative for back pain and joint pain.  Skin: Negative for rash.  Neurological: Negative for dizziness, sensory change, focal weakness, seizures, loss of consciousness, weakness and headaches.  Endo/Heme/Allergies: Negative for polydipsia.  Psychiatric/Behavioral: Negative for depression. The patient is not nervous/anxious.     MEDICATIONS AT HOME:   Prior to Admission medications   Medication Sig Start Date End Date Taking? Authorizing Provider  HUMALOG KWIKPEN 100 UNIT/ML KiwkPen Inject 0.04 mLs (4 Units total) into the skin 3 (three) times daily. 08/02/17  Yes Epifanio Lesches, MD  insulin aspart (NOVOLOG) 100 UNIT/ML injection Inject 0-5 Units into the skin at bedtime. 08/02/17  Yes Epifanio Lesches, MD  LEVEMIR FLEXTOUCH 100 UNIT/ML Pen Inject 16 Units into the skin daily at 8 pm. 08/02/17  Yes Epifanio Lesches, MD  Levonorgestrel (SKYLA IU) 1 Device by Intrauterine route.   Yes [provider]  SUMAtriptan (IMITREX) 50 MG tablet Take 1 tablet by mouth as needed. 06/11/17  Yes [provider]  divalproex (DEPAKOTE ER) 500 MG 24 hr tablet Take 500 mg by mouth daily. 01/22/17 01/22/18  [provider]  insulin aspart (NOVOLOG) 100 UNIT/ML injection Inject 0-9 Units into the skin 3 (three) times daily with meals. Patient not taking: Reported on 08/16/2017 08/02/17   Epifanio Lesches, MD      VITAL SIGNS:  Blood pressure 132/89, pulse (!) 119, temperature [facility] Kitchen)  100.8 F (38.2 C), temperature source Oral, resp. rate 16, height 5\' 6"  (1.676 m), weight 135 lb (61.2 kg), SpO2 99 %.  PHYSICAL EXAMINATION:  Physical Exam  Constitutional: She is oriented to person, place, and time and well-developed, well-nourished, and in no distress.  HENT:  Head: Normocephalic.  Mouth/Throat: Oropharynx is clear and moist.  Eyes: Pupils  are equal, round, and reactive to light. Conjunctivae and EOM are normal. No scleral icterus.  Neck: Normal range of motion. Neck supple. No JVD present. No tracheal deviation present.  Cardiovascular: Normal rate, regular rhythm and normal heart sounds.  Exam reveals no gallop.   No murmur heard. Pulmonary/Chest: Effort normal and breath sounds normal. No respiratory distress. She has no wheezes. She has no rales.  Abdominal: Soft. Bowel sounds are normal. She exhibits no distension. There is no tenderness. There is no rebound.  Genitourinary:  Genitourinary Comments: Right flank pain.  Musculoskeletal: Normal range of motion. She exhibits no edema or tenderness.  Neurological: She is alert and oriented to person, place, and time. No cranial nerve deficit.  Skin: No rash noted. No erythema.  Psychiatric: Affect normal.    LABORATORY PANEL:   CBC  Recent Labs Lab 08/16/17 1632  WBC 8.8  HGB 12.8  HCT 37.3  PLT 340   ------------------------------------------------------------------------------------------------------------------  Chemistries   Recent Labs Lab 08/16/17 1632  NA 134*  K 4.1  CL 98*  CO2 25  GLUCOSE 295*  BUN 11  CREATININE 0.63  CALCIUM 9.3  AST 15  ALT 12*  ALKPHOS 86  BILITOT 1.9*   ------------------------------------------------------------------------------------------------------------------  Cardiac Enzymes  Recent Labs Lab 08/16/17 1632  TROPONINI 0.03*   ------------------------------------------------------------------------------------------------------------------  RADIOLOGY:  Dg Chest Port 1 View  Result Date: 08/16/2017 CLINICAL DATA:  Recently hospitalized for DKA. Low back and right flank pain today with cloudy urine 1 week. EXAM: PORTABLE CHEST 1 VIEW COMPARISON:  09/09/2015 FINDINGS: The heart size and mediastinal contours are within normal limits. Both lungs are clear. The visualized skeletal structures are unremarkable.  IMPRESSION: No active disease. Electronically Signed   By: Marin Olp M.D.   On: 08/16/2017 19:34      IMPRESSION AND PLAN:   Sepsis due to pyelonephritis The patient will be admitted to medical floor. Start sepsis protocol, continue Rocephin, follow-up blood culture and urine culture. Pain control.  Diabetes type 1. Continue Levemir and start sliding scale.  Elevated bilirubin. Unclear ideology. Follow-up bilirubin level, abdominal ultrasound when necessary.  All the records are reviewed and case discussed with ED provider. Management plans discussed with the patient, family and they are in agreement.  CODE STATUS: Full code  TOTAL TIME TAKING CARE OF THIS PATIENT: 53 minutes.    Demetrios Loll M.D on 08/16/2017 at 9:08 PM  Between 7am to 6pm - Pager - [phone]  After 6pm go to [email or web address] - Proofreader  [facility]  Office  [phone]  CC: Primary care physician; [facility]   Note: This dictation was prepared with Dragon dictation along with smaller phrase technology. Any transcriptional errors that result from this process are unin

## 2017-08-16 NOTE — ED Notes (Signed)
ED Provider at bedside. 

## 2017-08-17 LAB — GLUCOSE, CAPILLARY
GLUCOSE-CAPILLARY: 133 mg/dL — AB (ref 65–99)
Glucose-Capillary: 118 mg/dL — ABNORMAL HIGH (ref 65–99)
Glucose-Capillary: 160 mg/dL — ABNORMAL HIGH (ref 65–99)
Glucose-Capillary: 73 mg/dL (ref 65–99)
Glucose-Capillary: 112 mg/dL — ABNORMAL HIGH (ref 65–99)

## 2017-08-17 LAB — BASIC METABOLIC PANEL
Anion gap: 8 (ref 5–15)
BUN: 10 mg/dL (ref 6–20)
CALCIUM: 7.9 mg/dL — AB (ref 8.9–10.3)
CHLORIDE: 107 mmol/L (ref 101–111)
CO2: 21 mmol/L — AB (ref 22–32)
CREATININE: 0.6 mg/dL (ref 0.44–1.00)
GFR calc non Af Amer: >60 mL/min (ref >60)
GLUCOSE: 179 mg/dL — AB (ref 65–99)
Potassium: 3.8 mmol/L (ref 3.5–5.1)
Sodium: 136 mmol/L (ref 135–145)

## 2017-08-17 LAB — CBC
HCT: 33.7 % — ABNORMAL LOW (ref 35.0–47.0)
Hemoglobin: 11.8 g/dL — ABNORMAL LOW (ref 12.0–16.0)
MCH: 31.1 pg (ref 26.0–34.0)
MCHC: 35 g/dL (ref 32.0–36.0)
MCV: 88.9 fL (ref 80.0–100.0)
PLATELETS: 289 10*3/uL (ref 150–440)
RBC: 3.8 MIL/uL (ref 3.80–5.20)
RDW: 12.3 % (ref 11.5–14.5)
WBC: 10.7 10*3/uL (ref 3.6–11.0)

## 2017-08-17 MED ORDER — MORPHINE SULFATE (PF) 2 MG/ML IV SOLN: 2.0000 mg | INTRAVENOUS | Status: DC | PRN

## 2017-08-17 MED ORDER — MORPHINE SULFATE (PF) 2 MG/ML IV SOLN
2.0000 mg | INTRAVENOUS | Status: DC | PRN
  Administered 2017-08-17 (×2): 2 mg via INTRAVENOUS
  Filled 2017-08-17 (×2): qty 1

## 2017-08-17 MED ORDER — SENNOSIDES-DOCUSATE SODIUM 8.6-50 MG PO TABS
1.0000 | ORAL_TABLET | Freq: Two times a day (BID) | ORAL | Status: DC
  Administered 2017-08-17: 1 via ORAL
  Filled 2017-08-17: qty 1

## 2017-08-17 MED ORDER — METOPROLOL TARTRATE 5 MG/5ML IV SOLN
5.0000 mg | Freq: Three times a day (TID) | INTRAVENOUS | Status: DC | PRN
  Filled 2017-08-17: qty 5

## 2017-08-17 NOTE — Progress Notes (Signed)
Informed MD via text page that pt was febrile at 103.1 F and has exceeded the 24-Hour 4g total dose of Tylenol today.  No new orders given at this time.  Will continue to monitor.

## 2017-08-17 NOTE — Progress Notes (Signed)
Spoke w/Matt in Pharmacy who advised okay to give IV Toradol as an antipyretic.    Medication administered.  See VS Flow sheet for temperature trends.

## 2017-08-17 NOTE — Progress Notes (Signed)
MD notified of high hr. Orders for IV metoprolol prn q8h. Will give and continue to monitor.

## 2017-08-17 NOTE — Progress Notes (Signed)
[place] [facility] [place] NAME: Emma Thomas    MR#:  [id number]  DATE OF BIRTH:  [birth year]  SUBJECTIVE:  Patient continues to have right-sided flank pain. She vomited earlier. Tolerating liquid. Did have fever 101.6.  REVIEW OF SYSTEMS:   Review of Systems  Constitutional: Negative for chills, fever and weight loss.  HENT: Negative for ear discharge, ear pain and nosebleeds.   Eyes: Negative for blurred vision, pain and discharge.  Respiratory: Negative for sputum production, shortness of breath, wheezing and stridor.   Cardiovascular: Negative for chest pain, palpitations, orthopnea and PND.  Gastrointestinal: Positive for abdominal pain, nausea and vomiting. Negative for diarrhea.  Genitourinary: Negative for frequency and urgency.  Musculoskeletal: Negative for back pain and joint pain.  Neurological: Positive for weakness. Negative for sensory change, speech change and focal weakness.  Psychiatric/Behavioral: Negative for depression and hallucinations. The patient is not nervous/anxious.    Tolerating Diet:CLD Tolerating PT: ambulaotry  DRUG ALLERGIES:  No Known Allergies  VITALS:  Blood pressure 114/65, pulse (!) 120, temperature 99.3 F (37.4 C), temperature source Oral, resp. rate 14, height 5\' 6"  (1.676 m), weight 61.2 kg (135 lb), SpO2 100 %.  PHYSICAL EXAMINATION:   Physical Exam  GENERAL:  21 y.o.-year-old patient lying in the bed with no acute distress.  EYES: Pupils equal, round, reactive to light and accommodation. No scleral icterus. Extraocular muscles intact.  HEENT: Head atraumatic, normocephalic. Oropharynx and nasopharynx clear.  NECK:  Supple, no jugular venous distention. No thyroid enlargement, no tenderness.  LUNGS: Normal breath sounds bilaterally, no wheezing, rales, rhonchi. No use of accessory muscles of respiration.  CARDIOVASCULAR: S1, S2 normal. No murmurs, rubs, or gallops.  ABDOMEN: Soft,  nontender, nondistended. Bowel sounds present. No organomegaly or mass.  EXTREMITIES: No cyanosis, clubbing or edema b/l.    NEUROLOGIC: Cranial nerves II through XII are intact. No focal Motor or sensory deficits b/l.   PSYCHIATRIC:  patient is alert and oriented x 3.  SKIN: No obvious rash, lesion, or ulcer.   LABORATORY PANEL:  CBC  Recent Labs Lab 08/17/17 0331  WBC 10.7  HGB 11.8*  HCT 33.7*  PLT 289    Chemistries   Recent Labs Lab 08/16/17 1632 08/17/17 0331  NA 134* 136  K 4.1 3.8  CL 98* 107  CO2 25 21*  GLUCOSE 295* 179*  BUN 11 10  CREATININE 0.63 0.60  CALCIUM 9.3 7.9*  AST 15  --   ALT 12*  --   ALKPHOS 86  --   BILITOT 1.9*  --    Cardiac Enzymes  Recent Labs Lab 08/16/17 1632  TROPONINI 0.03*   RADIOLOGY:  Dg Chest Port 1 View  Result Date: 08/16/2017 CLINICAL DATA:  Recently hospitalized for DKA. Low back and right flank pain today with cloudy urine 1 week. EXAM: PORTABLE CHEST 1 VIEW COMPARISON:  09/09/2015 FINDINGS: The heart size and mediastinal contours are within normal limits. Both lungs are clear. The visualized skeletal structures are unremarkable. IMPRESSION: No active disease. Electronically Signed   By: Marin Olp M.D.   On: 08/16/2017 19:34   ASSESSMENT AND PLAN:   Emma Thomas  is a 21 y.o. female with a known history of Diabetes 1, DKA and pyelonephritis. The patient presently ED with the above chief complaints. She complains of fever, chills, right sided flank pain since last night. She also complains of nausea and mild abdominal pain. She has dysuria and urinary frequency and  urgency. She was found septic and UTI  1.Sepsis due to right sided pyelonephritis -Patient came in with tachycardia fever nausea vomiting and right flank pain. -on  sepsis protocol - continue Rocephin - follow-up blood culture  Negative -urine culture pending -prn Pain control  2.Diabetes type 1. Continue Levemir and start sliding scale.  3.  DVT prophylaxis subcutaneous Lovenox.  4. Nausea vomiting when necessary Zofran and continue IV fluids  Case discussed with Care Management/Social Worker. Management plans discussed with the patient, family and they are in agreement.  CODE STATUS: FuLL  DVT Prophylaxis: Lovenox  TOTAL TIME TAKING CARE OF THIS PATIENT: *40* minutes.  >50% time spent on counselling and coordination of care  POSSIBLE D/C IN *1-2* DAYS, DEPENDING ON CLINICAL CONDITION.  Note: This dictation was prepared with Dragon dictation along with smaller phrase technology. Any transcriptional errors that result from this process are unintentional.  Emma Thomas M.D on 08/17/2017 at 1:56 PM  Between 7am to 6pm - Pager - [phone]  After 6pm go to [email or web address] - Proofreader  [facility]  Office  [phone]  CC: Primary care physician; Shari Prows, [facility]Patient ID: Emma Thomas, female   DOB: 10-23-1996, 21 y.o.   MRN: [id number]

## 2017-08-17 NOTE — Progress Notes (Signed)
Inpatient Diabetes Program Recommendations  AACE/ADA: New Consensus Statement on Inpatient Glycemic Control (2015)  Target Ranges:  Prepandial:   less than 140 mg/dL      Peak postprandial:   less than 180 mg/dL (1-2 hours)      Critically ill patients:  140 - 180 mg/dL   Lab Results  Component Value Date   GLUCAP 160 (H) 08/17/2017   HGBA1C 12.2 (H) 07/31/2017    Review of Glycemic ControlResults for JACKILYN, UMPHLETT (MRN [id number]) as of 08/17/2017 12:16  Ref. Range 08/16/2017 21:53 08/17/2017 07:33 08/17/2017 11:52  Glucose-Capillary Latest Ref Range: 65 - 99 mg/dL 225 (H) 118 (H) 160 (H)   Diabetes history: DM1 (Patient does not make insulin and is sensitive to insulin; will require basal, correction, and meal coverage insulin) Outpatient Diabetes medications: Levemir 16 units q 2000, Humalog 4 units TID with meals Current orders for Inpatient glycemic control:  Levemir 16 units daily, Novolog 4 units tid with meals, Novolog sensitive tid with meals and HS   Inpatient Diabetes Program Recommendations:   Agree with current orders.  Blood sugars currently within hospital goals.    Thanks,  Adah Perl, RN, BC-ADM Inpatient Diabetes Coordinator Pager [phone] (8a-5p)

## 2017-08-18 LAB — GLUCOSE, CAPILLARY
Glucose-Capillary: 85 mg/dL (ref 65–99)
Glucose-Capillary: 100 mg/dL — ABNORMAL HIGH (ref 65–99)

## 2017-08-18 LAB — URINE CULTURE

## 2017-08-18 MED ORDER — HYDROCODONE-ACETAMINOPHEN 5-325 MG PO TABS: 1.0000 | ORAL_TABLET | Freq: Four times a day (QID) | ORAL | 0 refills | Status: DC | PRN

## 2017-08-18 MED ORDER — FLEET ENEMA 7-19 GM/118ML RE ENEM
1.0000 | ENEMA | Freq: Once | RECTAL | Status: AC
  Administered 2017-08-18: 1 via RECTAL

## 2017-08-18 MED ORDER — BISACODYL 10 MG RE SUPP
10.0000 mg | Freq: Once | RECTAL | Status: AC
  Administered 2017-08-18: 10 mg via RECTAL
  Filled 2017-08-18: qty 1

## 2017-08-18 MED ORDER — CEPHALEXIN 500 MG PO CAPS: 500.0000 mg | ORAL_CAPSULE | Freq: Three times a day (TID) | ORAL | 0 refills | Status: DC

## 2017-08-18 MED ORDER — CEPHALEXIN 500 MG PO CAPS
500.0000 mg | ORAL_CAPSULE | Freq: Three times a day (TID) | ORAL | Status: DC
  Administered 2017-08-18: 500 mg via ORAL
  Filled 2017-08-18: qty 1

## 2017-08-18 NOTE — Progress Notes (Signed)
Pt states that she has not had a BM "for 2 weeks" Dr. Posey Pronto notified she ordered to administer bisacodyl suppository now and if no results to administer a fleet enema. She also stated that pt may be discharged after she has a BM.

## 2017-08-18 NOTE — Progress Notes (Signed)
Emma Thomas was admitted to the Hospital on 08/16/2017 and Discharged  08/18/2017 and should be excused from work/school   for 4  days starting 08/16/2017 , may return to work/school without any restrictions.  Call Fritzi Mandes MD, [facility]  [phone] with questions.  Emma Thomas M.D on 08/18/2017,at 3:20 PM  Patient ID: Emma Thomas, female   DOB: 1996-06-24, 21 y.o.   MRN: [id number]

## 2017-08-18 NOTE — Progress Notes (Signed)
Patient is to be discharged home today. Patient is in no acute distress at this time, and assessment is unchanged from this morning. Patient's IV is out, discharge paperwork has been discussed with patient/family and there are no questions or concerns at this time. Pt states she had a small bowel movement once she received the suppository and no additional bowel movement with enema. However, she states that she is comfortable with going home if Dr. Posey Pronto is okay with it. Dr. Posey Pronto aware and is ok with discharge.  Patient will be accompanied downstairs by staff and family via wheelchair.

## 2017-08-18 NOTE — Discharge Summary (Signed)
[place] at [place] NAME: Emma Thomas    MR#:  [id number]  DATE OF BIRTH:  Oct 28, 1996  DATE OF ADMISSION:  08/16/2017 ADMITTING PHYSICIAN: Demetrios Loll, MD  DATE OF DISCHARGE: 08/18/17  PRIMARY CARE PHYSICIAN: [place], [facility]    ADMISSION DIAGNOSIS:  Pyelonephritis [N12] Sepsis, due to unspecified organism ([place]) [A41.9]  DISCHARGE DIAGNOSIS:  Acute Right side pyelonephritis Sepsis on admission--resolved DM-1  SECONDARY DIAGNOSIS:   Past Medical History:  Diagnosis Date  . Diabetes mellitus without complication (HCC)    Type 1 DM    HOSPITAL COURSE:   Emma Thomas a 21 y.o.femalewith a known history of Diabetes 1, DKA and pyelonephritis. The patient presently ED with the above chief complaints. She complains of fever, chills, right sided flank pain since last night. She also complains of nausea and mild abdominal pain. She has dysuria and urinary frequency and urgency. She was found septic and UTI  1.Sepsis due to right sided pyelonephritis -Patient came in with tachycardia fever nausea vomiting and right flank pain. -on  sepsis protocol - continue Rocephin---change to po keflex tid for 5 days - blood culture  Negative -urine culture multiple species -prn Pain control -now afebrile  2.Diabetes type 1. Continue Levemir and start sliding scale.  3. DVT prophylaxis subcutaneous Lovenox.  4. Nausea vomiting when necessary Zofran -recieved IV fluids  5. Constipation Dulcolax suppository  Overall improving Will d/c later to go home Pt agreeable CONSULTS OBTAINED:    DRUG ALLERGIES:  No Known Allergies  DISCHARGE MEDICATIONS:   Current Discharge Medication List    START taking these medications   Details  cephALEXin (KEFLEX) 500 MG capsule Take 1 capsule (500 mg total) by mouth every 8 (eight) hours. Qty: 15 capsule, Refills: 0    HYDROcodone-acetaminophen (NORCO/VICODIN) 5-325 MG  tablet Take 1 tablet by mouth every 6 (six) hours as needed for moderate pain. Qty: 10 tablet, Refills: 0      CONTINUE these medications which have NOT CHANGED   Details  HUMALOG KWIKPEN 100 UNIT/ML KiwkPen Inject 0.04 mLs (4 Units total) into the skin 3 (three) times daily. Qty: 15 mL, Refills: 11    !! insulin aspart (NOVOLOG) 100 UNIT/ML injection Inject 0-5 Units into the skin at bedtime. Qty: 10 mL, Refills: 11    LEVEMIR FLEXTOUCH 100 UNIT/ML Pen Inject 16 Units into the skin daily at 8 pm. Qty: 15 mL, Refills: 1    Levonorgestrel (SKYLA IU) 1 Device by Intrauterine route.    SUMAtriptan (IMITREX) 50 MG tablet Take 1 tablet by mouth as needed.    divalproex (DEPAKOTE ER) 500 MG 24 hr tablet Take 500 mg by mouth daily.    !! insulin aspart (NOVOLOG) 100 UNIT/ML injection Inject 0-9 Units into the skin 3 (three) times daily with meals. Qty: 10 mL, Refills: 11     !! - Potential duplicate medications found. Please discuss with provider.      If you experience worsening of your admission symptoms, develop shortness of breath, life threatening emergency, suicidal or homicidal thoughts you must seek medical attention immediately by calling 911 or calling your MD immediately  if symptoms less severe.  You Must read complete instructions/literature along with all the possible adverse reactions/side effects for all the Medicines you take and that have been prescribed to you. Take any new Medicines after you have completely understood and accept all the possible adverse reactions/side effects.   Please note  You were cared  for by a hospitalist during your hospital stay. If you have any questions about your discharge medications or the care you received while you were in the hospital after you are discharged, you can call the unit and asked to speak with the hospitalist on call if the hospitalist that took care of you is not available. Once you are discharged, your primary care  physician will handle any further medical issues. Please note that NO REFILLS for any discharge medications will be authorized once you are discharged, as it is imperative that you return to your primary care physician (or establish a relationship with a primary care physician if you do not have one) for your aftercare needs so that they can reassess your need for medications and monitor your lab values. Today   SUBJECTIVE   constipation  VITAL SIGNS:  Blood pressure 108/60, pulse (!) 104, temperature 98.3 F (36.8 C), temperature source Oral, resp. rate 16, height 5\' 6"  (1.676 m), weight 61.2 kg (135 lb), SpO2 99 %.  I/O:   Intake/Output Summary (Last 24 hours) at 08/18/17 1106 Last data filed at 08/18/17 1008  Gross per 24 hour  Intake             2805 ml  Output             1500 ml  Net             1305 ml    PHYSICAL EXAMINATION:  GENERAL:  21 y.o.-year-old patient lying in the bed with no acute distress.  EYES: Pupils equal, round, reactive to light and accommodation. No scleral icterus. Extraocular muscles intact.  HEENT: Head atraumatic, normocephalic. Oropharynx and nasopharynx clear.  NECK:  Supple, no jugular venous distention. No thyroid enlargement, no tenderness.  LUNGS: Normal breath sounds bilaterally, no wheezing, rales,rhonchi or crepitation. No use of accessory muscles of respiration.  CARDIOVASCULAR: S1, S2 normal. No murmurs, rubs, or gallops.  ABDOMEN: Soft, non-tender, non-distended. Bowel sounds present. No organomegaly or mass.  EXTREMITIES: No pedal edema, cyanosis, or clubbing.  NEUROLOGIC: Cranial nerves II through XII are intact. Muscle strength 5/5 in all extremities. Sensation intact. Gait not checked.  PSYCHIATRIC: The patient is alert and oriented x 3.  SKIN: No obvious rash, lesion, or ulcer.   DATA REVIEW:   CBC   Recent Labs Lab 08/17/17 0331  WBC 10.7  HGB 11.8*  HCT 33.7*  PLT 289    Chemistries   Recent Labs Lab 08/16/17 1632  08/17/17 0331  NA 134* 136  K 4.1 3.8  CL 98* 107  CO2 25 21*  GLUCOSE 295* 179*  BUN 11 10  CREATININE 0.63 0.60  CALCIUM 9.3 7.9*  AST 15  --   ALT 12*  --   ALKPHOS 86  --   BILITOT 1.9*  --     Microbiology Results   Recent Results (from the past 240 hour(s))  Blood Culture (routine x 2)     Status: None (Preliminary result)   Collection Time: 08/16/17  6:05 PM  Result Value Ref Range Status   Specimen Description BLOOD RIGHT ANTECUBITAL  Final   Special Requests   Final    BOTTLES DRAWN AEROBIC AND ANAEROBIC Blood Culture adequate volume   Culture NO GROWTH 2 DAYS  Final   Report Status PENDING  Incomplete  Blood Culture (routine x 2)     Status: None (Preliminary result)   Collection Time: 08/16/17  6:20 PM  Result Value Ref Range Status  Specimen Description BLOOD LEFT ANTECUBITAL  Final   Special Requests   Final    BOTTLES DRAWN AEROBIC AND ANAEROBIC Blood Culture adequate volume   Culture NO GROWTH 2 DAYS  Final   Report Status PENDING  Incomplete  Urine culture     Status: Abnormal   Collection Time: 08/16/17  7:27 PM  Result Value Ref Range Status   Specimen Description URINE, RANDOM  Final   Special Requests NONE  Final   Culture MULTIPLE SPECIES PRESENT, SUGGEST RECOLLECTION (A)  Final   Report Status 08/18/2017 FINAL  Final    RADIOLOGY:  Dg Chest Port 1 View  Result Date: 08/16/2017 CLINICAL DATA:  Recently hospitalized for DKA. Low back and right flank pain today with cloudy urine 1 week. EXAM: PORTABLE CHEST 1 VIEW COMPARISON:  09/09/2015 FINDINGS: The heart size and mediastinal contours are within normal limits. Both lungs are clear. The visualized skeletal structures are unremarkable. IMPRESSION: No active disease. Electronically Signed   By: Marin Olp M.D.   On: 08/16/2017 19:34     Management plans discussed with the patient, family and they are in agreement.  CODE STATUS:     Code Status Orders        Start     Ordered    08/16/17 2145  Full code  Continuous     08/16/17 2144    Code Status History    Date Active Date Inactive Code Status Order ID Comments User Context   07/31/2017 10:34 PM 08/02/2017  3:38 PM Full Code [id number]  [place], Ubaldo Glassing, DO Inpatient   07/31/2016  1:23 AM 08/02/2016  4:42 PM Full Code [id number]  Harvie Bridge, DO Inpatient   06/08/2016  9:36 PM 06/11/2016 12:08 PM Full Code [id number]  Gladstone Lighter, MD Inpatient   02/03/2016  2:31 PM 02/04/2016  4:16 PM Full Code [id number]  Hillary Bow, MD ED   09/09/2015 11:02 AM 09/10/2015  3:32 PM Full Code [id number]  Henreitta Leber, MD Inpatient      TOTAL TIME TAKING CARE OF THIS PATIENT: *40 minutes.    Mansa Willers M.D on 08/18/2017 at 11:06 AM  Between 7am to 6pm - Pager - [phone] After 6pm go to [email or web address] - password [email or web address] [facility]  Office  [phone]  CC: Primary care physician; [facility]

## 2017-08-18 NOTE — Progress Notes (Signed)
Pt stated she had a very small bowel movement and would like to see if she gets more results with an enema. In addition, she is still not hungry and has not eaten breakfast or lunch. She has had some juice. Dr. Posey Pronto aware.

## 2017-08-20 ENCOUNTER — Ambulatory Visit
Admission: EM | Admit: 2017-08-20 | Discharge: 2017-08-20 | Disposition: A | Discharge status: Home / Self Care | Payer: 59 | Attending: Family Medicine | Admitting: Family Medicine

## 2017-08-20 ENCOUNTER — Encounter: Payer: Self-pay | Admitting: *Deleted

## 2017-08-20 DIAGNOSIS — M5441 Lumbago with sciatica, right side: Secondary | ICD-10-CM

## 2017-08-20 LAB — URINALYSIS, COMPLETE (UACMP) WITH MICROSCOPIC
BACTERIA UA: NONE SEEN
Bilirubin Urine: NEGATIVE
GLUCOSE, UA: NEGATIVE mg/dL
Ketones, ur: NEGATIVE mg/dL
Nitrite: NEGATIVE
PH: 7 (ref 5.0–8.0)
Protein, ur: 30 mg/dL — AB
SPECIFIC GRAVITY, URINE: 1.015 (ref 1.005–1.030)

## 2017-08-20 MED ORDER — HYDROCODONE-ACETAMINOPHEN 5-325 MG PO TABS: ORAL_TABLET | ORAL | 0 refills | Status: DC

## 2017-08-20 MED ORDER — CYCLOBENZAPRINE HCL 10 MG PO TABS: 10.0000 mg | ORAL_TABLET | Freq: Three times a day (TID) | ORAL | 0 refills | Status: DC | PRN

## 2017-08-20 NOTE — ED Provider Notes (Signed)
[facility]    CSN: [id number] Arrival date & time: 08/20/17  1405     History   Chief Complaint Chief Complaint  Patient presents with  . Flank Pain    HPI Emma Thomas is a 21 y.o. female.   21 yo female with a c/o right lower back pain radiating down the leg. Patient states "it might be my kidney because I was recently in the hospital with a kidney infection". Denies any fevers or chills today; denies any nausea, vomiting, dysuria, diarrhea, constipation. Patient was hospitalized 5 days ago with pyelonephritis, given IV antibiotic and discharged home 2 days ago on oral antibiotic which she is still taking. Blood cultures were negative.    The history is provided by the patient.  Flank Pain     Past Medical History:  Diagnosis Date  . Diabetes mellitus without complication ([place])    Type 1 DM    Patient Active Problem List   Diagnosis Date Noted  . Pyelonephritis 07/30/2016  . Sepsis ([place]) 06/08/2016  . DKA (diabetic ketoacidoses) ([place]) 09/09/2015    Past Surgical History:  Procedure Laterality Date  . abscess removal      OB History    No data available       Home Medications    Prior to Admission medications   Medication Sig Start Date End Date Taking? Authorizing Provider  cephALEXin (KEFLEX) 500 MG capsule Take 1 capsule (500 mg total) by mouth every 8 (eight) hours. 08/18/17  Yes Fritzi Mandes, MD  divalproex (DEPAKOTE ER) 500 MG 24 hr tablet Take 500 mg by mouth daily. 01/22/17 01/22/18 Yes [provider]  HUMALOG KWIKPEN 100 UNIT/ML KiwkPen Inject 0.04 mLs (4 Units total) into the skin 3 (three) times daily. 08/02/17  Yes Epifanio Lesches, MD  insulin aspart (NOVOLOG) 100 UNIT/ML injection Inject 0-9 Units into the skin 3 (three) times daily with meals. 08/02/17  Yes Epifanio Lesches, MD  insulin aspart (NOVOLOG) 100 UNIT/ML injection Inject 0-5 Units into the skin at bedtime. 08/02/17  Yes Epifanio Lesches, MD  LEVEMIR  FLEXTOUCH 100 UNIT/ML Pen Inject 16 Units into the skin daily at 8 pm. 08/02/17  Yes Epifanio Lesches, MD  Levonorgestrel (SKYLA IU) 1 Device by Intrauterine route.   Yes [provider]  SUMAtriptan (IMITREX) 50 MG tablet Take 1 tablet by mouth as needed. 06/11/17  Yes [provider]  cyclobenzaprine (FLEXERIL) 10 MG tablet Take 1 tablet (10 mg total) by mouth 3 (three) times daily as needed for muscle spasms. 08/20/17   Norval Gable, MD  HYDROcodone-acetaminophen (NORCO/VICODIN) 5-325 MG tablet 1-2 tabs po bid prn 08/20/17   Norval Gable, MD    Family History Family History  Problem Relation Age of Onset  . Breast cancer Mother   . Diabetes type II Brother     Social History Social History  Substance Use Topics  . Smoking status: Never Smoker  . Smokeless tobacco: Never Used  . Alcohol use 1.2 oz/week    2 Shots of liquor per week     Allergies   [name].   Review of Systems Review of Systems  Genitourinary: Positive for flank pain.     Physical Exam Triage Vital Signs ED Triage Vitals  Enc Vitals Group     BP 08/20/17 1510 133/85     Pulse Rate 08/20/17 1510 (!) 109     Resp 08/20/17 1510 16     Temp 08/20/17 1510 98.9 F (37.2  C)     Temp Source 08/20/17 1510 Oral     SpO2 08/20/17 1510 100 %     Weight 08/20/17 1511 135 lb (61.2 kg)     Height 08/20/17 1511 5\' 6"  (1.676 m)     Head Circumference --      Peak Flow --      Pain Score 08/20/17 1512 9     Pain Loc --      Pain Edu? --      Excl. in [place]? --    No data found.   Updated Vital Signs BP 133/85 (BP Location: Left Arm)   Pulse (!) 109   Temp 98.9 F (37.2 C) (Oral)   Resp 16   Ht 5\' 6"  (1.676 m)   Wt 135 lb (61.2 kg)   SpO2 100%   BMI 21.79 kg/m   Visual Acuity Right Eye Distance:   Left Eye Distance:   Bilateral Distance:    Right Eye Near:   Left Eye Near:    Bilateral Near:     Physical Exam  Constitutional: She appears  well-developed and well-nourished. No distress.  Abdominal: Soft. Bowel sounds are normal. She exhibits no distension and no mass. There is no tenderness. There is no rebound and no guarding.  Musculoskeletal:       Lumbar back: She exhibits tenderness (over the lumbar sacral paraspinous muscles on the right). She exhibits normal range of motion, no bony tenderness, no swelling, no edema, no deformity, no laceration and no pain.  Skin: She is not diaphoretic.  Nursing note and vitals reviewed.    UC Treatments / Results  Labs (all labs ordered are listed, but only abnormal results are displayed) Labs Reviewed  URINALYSIS, COMPLETE (UACMP) WITH MICROSCOPIC - Abnormal; Notable for the following:       Result Value   Hgb urine dipstick MODERATE (*)    Protein, ur 30 (*)    Leukocytes, UA TRACE (*)    Squamous Epithelial / LPF 6-30 (*)    All other components within normal limits    EKG  EKG Interpretation None       Radiology No results found.  Procedures Procedures (including critical care time)  Medications Ordered in UC Medications - No data to display   Initial Impression / Assessment and Plan / UC Course  I have reviewed the triage vital signs and the nursing notes.  Pertinent labs & imaging results that were available during my care of the patient were reviewed by me and considered in my medical decision making (see chart for details).       Final Clinical Impressions(s) / UC Diagnoses   Final diagnoses:  Acute right-sided low back pain with right-sided sciatica    New Prescriptions Discharge Medication List as of 08/20/2017  4:03 PM    START taking these medications   Details  cyclobenzaprine (FLEXERIL) 10 MG tablet Take 1 tablet (10 mg total) by mouth 3 (three) times daily as needed for muscle spasms., Starting Fri 08/20/2017, Normal        1. Lab result (UA negative for infection) and diagnosis reviewed with patient 2. rx as per orders above;  reviewed possible side effects, interactions, risks and benefits  3. Recommend supportive treatment with increased fluids, rest, heat/ice 4. Follow-up prn if symptoms worsen or don't improve  Controlled Substance Prescriptions [place] Controlled Substance Registry consulted? Not Applicable   Norval Gable, MD 08/20/17 [phone]

## 2017-08-20 NOTE — ED Triage Notes (Signed)
Pt discharged from [facility] Wednesday, was admitted for "kidney infection". Here today with right flank pain and cloudy urine. Also, possible fever.

## 2017-08-21 LAB — CULTURE, BLOOD (ROUTINE X 2)
CULTURE: NO GROWTH
Culture: NO GROWTH
SPECIAL REQUESTS: ADEQUATE
SPECIAL REQUESTS: ADEQUATE

## 2018-04-26 ENCOUNTER — Other Ambulatory Visit: Payer: Self-pay

## 2018-04-26 ENCOUNTER — Encounter: Payer: Self-pay | Admitting: Emergency Medicine

## 2018-04-26 ENCOUNTER — Ambulatory Visit
Admission: EM | Admit: 2018-04-26 | Discharge: 2018-04-26 | Disposition: A | Discharge status: Home / Self Care | Payer: 59 | Attending: Family Medicine | Admitting: Family Medicine

## 2018-04-26 DIAGNOSIS — K029 Dental caries, unspecified: Secondary | ICD-10-CM | POA: Diagnosis not present

## 2018-04-26 DIAGNOSIS — K0889 Other specified disorders of teeth and supporting structures: Secondary | ICD-10-CM

## 2018-04-26 MED ORDER — HYDROCODONE-ACETAMINOPHEN 5-325 MG PO TABS: 1.0000 | ORAL_TABLET | Freq: Four times a day (QID) | ORAL | 0 refills | Status: DC | PRN

## 2018-04-26 MED ORDER — AMOXICILLIN-POT CLAVULANATE 875-125 MG PO TABS: 1.0000 | ORAL_TABLET | Freq: Two times a day (BID) | ORAL | 0 refills | Status: DC

## 2018-04-26 NOTE — ED Triage Notes (Signed)
Patient states she has a broken tooth on the right upper side that is giving her a lot of pain and she can't get in to see her dentist

## 2018-04-26 NOTE — ED Provider Notes (Signed)
[facility]   CSN: [id number] Arrival date & time: 04/26/18  1433  History   Chief Complaint Chief Complaint  Patient presents with  . Dental Pain   HPI  22 year old female presents with dental pain.  Patient has multiple dental caries/eroding teeth.  She states that she recently broke a tooth recently and is now experiencing worsen pain.  Pain is severe.  Patient states that she called dentist but was unable to be seen.  No fever she has taken [employer] powder with some improvement.  Is no longer working.  No other associated symptoms.  No other complaints or concerns at this time.  Past Medical History:  Diagnosis Date  . Diabetes mellitus without complication ([place])    Type 1 DM   Patient Active Problem List   Diagnosis Date Noted  . Pyelonephritis 07/30/2016  . Sepsis ([place]) 06/08/2016  . DKA (diabetic ketoacidoses) ([place]) 09/09/2015   Past Surgical History:  Procedure Laterality Date  . abscess removal     OB History   None    Home Medications    Prior to Admission medications   Medication Sig Start Date End Date Taking? Authorizing Provider  HUMALOG KWIKPEN 100 UNIT/ML KiwkPen Inject 0.04 mLs (4 Units total) into the skin 3 (three) times daily. 08/02/17  Yes Epifanio Lesches, MD  insulin aspart (NOVOLOG) 100 UNIT/ML injection Inject 0-9 Units into the skin 3 (three) times daily with meals. 08/02/17  Yes Epifanio Lesches, MD  insulin aspart (NOVOLOG) 100 UNIT/ML injection Inject 0-5 Units into the skin at bedtime. 08/02/17  Yes Epifanio Lesches, MD  LEVEMIR FLEXTOUCH 100 UNIT/ML Pen Inject 16 Units into the skin daily at 8 pm. 08/02/17  Yes Epifanio Lesches, MD  Levonorgestrel (SKYLA IU) 1 Device by Intrauterine route.   Yes [provider]  SUMAtriptan (IMITREX) 50 MG tablet Take 1 tablet by mouth as needed. 06/11/17  Yes [provider]  amoxicillin-clavulanate (AUGMENTIN) 875-125 MG tablet Take 1 tablet by mouth every 12 (twelve)  hours. 04/26/18   Coral Spikes, DO  HYDROcodone-acetaminophen (NORCO/VICODIN) 5-325 MG tablet Take 1 tablet by mouth every 6 (six) hours as needed. 04/26/18   Coral Spikes, DO    Family History Family History  Problem Relation Age of Onset  . Breast cancer Mother   . Diabetes type II Brother     Social History Social History   Tobacco Use  . Smoking status: Never Smoker  . Smokeless tobacco: Never Used  Substance Use Topics  . Alcohol use: Not Currently    Alcohol/week: 1.2 oz    Types: 2 Shots of liquor per week  . Drug use: No     Allergies   [name].   Review of Systems Review of Systems  Constitutional: Negative.   HENT: Positive for dental problem.    Physical Exam Triage Vital Signs ED Triage Vitals  Enc Vitals Group     BP 04/26/18 1446 (!) 125/96     Pulse Rate 04/26/18 1446 100     Resp 04/26/18 1446 16     Temp 04/26/18 1446 98.6 F (37 C)     Temp src --      SpO2 04/26/18 1446 100 %     Weight 04/26/18 1447 140 lb (63.5 kg)     Height 04/26/18 1447 5\' 6"  (1.676 m)     Head Circumference --      Peak Flow --      Pain Score 04/26/18  1446 10     Pain Loc --      Pain Edu? --      Excl. in [place]? --    Updated Vital Signs BP (!) 125/96 (BP Location: Left Arm)   Pulse 100   Temp 98.6 F (37 C)   Resp 16   Ht 5\' 6"  (1.676 m)   Wt 140 lb (63.5 kg)   SpO2 100%   BMI 22.60 kg/m  Physical Exam  Constitutional: She is oriented to person, place, and time. She appears well-developed. No distress.  HENT:  Head: Normocephalic and atraumatic.  Multiple dental caries which are quite extensive.  Cardiovascular: Normal rate and regular rhythm.  Pulmonary/Chest: Effort normal and breath sounds normal.  Neurological: She is alert and oriented to person, place, and time.  Psychiatric: She has a normal mood and affect. Her behavior is normal.  Nursing note and vitals reviewed.  UC Treatments / Results  Labs (all labs ordered are  listed, but only abnormal results are displayed) Labs Reviewed - No data to display  EKG None  Radiology No results found.  Procedures Procedures (including critical care time)  Medications Ordered in UC Medications - No data to display  Initial Impression / Assessment and Plan / UC Course  I have reviewed the triage vital signs and the nursing notes.  Pertinent labs & imaging results that were available during my care of the patient were reviewed by me and considered in my medical decision making (see chart for details).    22 year old female presents with multiple dental caries.  Ongoing severe pain after broken tooth.  Icing on Augmentin.  Vicodin for pain.  [place] controlled substance database was reviewed.  No concerns at this time.  Advised to see dentist.  Final Clinical Impressions(s) / UC Diagnoses   Final diagnoses:  Pain due to dental caries     Discharge Instructions     Call dentist.  Meds a prescribed.  Take care  Dr. Lacinda Axon    ED Prescriptions    Medication Sig Dispense Auth. Provider   HYDROcodone-acetaminophen (NORCO/VICODIN) 5-325 MG tablet Take 1 tablet by mouth every 6 (six) hours as needed. 10 tablet Catheleen Langhorne G, DO   amoxicillin-clavulanate (AUGMENTIN) 875-125 MG tablet Take 1 tablet by mouth every 12 (twelve) hours. 14 tablet Coral Spikes, DO     Controlled Substance Prescriptions [place] Controlled Substance Registry consulted? Not Applicable   Coral Spikes, DO 04/26/18 1535

## 2018-04-26 NOTE — Discharge Instructions (Signed)
Call dentist.  Meds a prescribed.  Take care  Dr. Lacinda Axon

## 2018-07-02 ENCOUNTER — Other Ambulatory Visit: Payer: Self-pay

## 2018-07-02 ENCOUNTER — Emergency Department
Admission: EM | Admit: 2018-07-02 | Discharge: 2018-07-02 | Disposition: A | Discharge status: Home / Self Care | Payer: 59 | Attending: Emergency Medicine | Admitting: Emergency Medicine

## 2018-07-02 ENCOUNTER — Encounter: Payer: Self-pay | Admitting: Emergency Medicine

## 2018-07-02 DIAGNOSIS — N10 Acute tubulo-interstitial nephritis: Secondary | ICD-10-CM | POA: Insufficient documentation

## 2018-07-02 DIAGNOSIS — E109 Type 1 diabetes mellitus without complications: Secondary | ICD-10-CM | POA: Insufficient documentation

## 2018-07-02 DIAGNOSIS — Z794 Long term (current) use of insulin: Secondary | ICD-10-CM | POA: Insufficient documentation

## 2018-07-02 DIAGNOSIS — N12 Tubulo-interstitial nephritis, not specified as acute or chronic: Secondary | ICD-10-CM

## 2018-07-02 LAB — URINALYSIS, COMPLETE (UACMP) WITH MICROSCOPIC
Bilirubin Urine: NEGATIVE
Glucose, UA: >=500 mg/dL — AB
Ketones, ur: 20 mg/dL — AB
Nitrite: NEGATIVE
Protein, ur: 100 mg/dL — AB
SPECIFIC GRAVITY, URINE: 1.015 (ref 1.005–1.030)
WBC, UA: >50 WBC/hpf — ABNORMAL HIGH (ref 0–5)
pH: 5 (ref 5.0–8.0)

## 2018-07-02 LAB — BASIC METABOLIC PANEL
Anion gap: 14 (ref 5–15)
BUN: 12 mg/dL (ref 6–20)
CHLORIDE: 95 mmol/L — AB (ref 98–111)
CO2: 24 mmol/L (ref 22–32)
CREATININE: 0.88 mg/dL (ref 0.44–1.00)
Calcium: 8.7 mg/dL — ABNORMAL LOW (ref 8.9–10.3)
GFR calc Af Amer: >60 mL/min (ref >60)
GFR calc non Af Amer: >60 mL/min (ref >60)
Glucose, Bld: 279 mg/dL — ABNORMAL HIGH (ref 70–99)
Potassium: 4.2 mmol/L (ref 3.5–5.1)
SODIUM: 133 mmol/L — AB (ref 135–145)

## 2018-07-02 LAB — CBC
HCT: 36.2 % (ref 35.0–47.0)
Hemoglobin: 12.7 g/dL (ref 12.0–16.0)
MCH: 31.4 pg (ref 26.0–34.0)
MCHC: 34.9 g/dL (ref 32.0–36.0)
MCV: 89.7 fL (ref 80.0–100.0)
PLATELETS: 323 10*3/uL (ref 150–440)
RBC: 4.04 MIL/uL (ref 3.80–5.20)
RDW: 12.6 % (ref 11.5–14.5)
WBC: 9.6 10*3/uL (ref 3.6–11.0)

## 2018-07-02 LAB — LACTIC ACID, PLASMA: LACTIC ACID, VENOUS: 0.7 mmol/L (ref 0.5–1.9)

## 2018-07-02 LAB — POCT PREGNANCY, URINE: PREG TEST UR: NEGATIVE

## 2018-07-02 LAB — GLUCOSE, CAPILLARY: GLUCOSE-CAPILLARY: 256 mg/dL — AB (ref 70–99)

## 2018-07-02 MED ORDER — CEPHALEXIN 500 MG PO CAPS: 500.0000 mg | ORAL_CAPSULE | Freq: Three times a day (TID) | ORAL | 0 refills | Status: AC

## 2018-07-02 MED ORDER — SODIUM CHLORIDE 0.9 % IV SOLN
1.0000 g | Freq: Once | INTRAVENOUS | Status: AC
  Administered 2018-07-02: 1 g via INTRAVENOUS
  Filled 2018-07-02: qty 10

## 2018-07-02 MED ORDER — ONDANSETRON 8 MG PO TBDP: 8.0000 mg | ORAL_TABLET | Freq: Three times a day (TID) | ORAL | 0 refills | Status: DC | PRN

## 2018-07-02 MED ORDER — KETOROLAC TROMETHAMINE 30 MG/ML IJ SOLN
30.0000 mg | Freq: Once | INTRAMUSCULAR | Status: AC
  Administered 2018-07-02: 30 mg via INTRAVENOUS
  Filled 2018-07-02: qty 1

## 2018-07-02 MED ORDER — ACETAMINOPHEN 325 MG PO TABS
650.0000 mg | ORAL_TABLET | Freq: Once | ORAL | Status: AC
  Administered 2018-07-02: 650 mg via ORAL
  Filled 2018-07-02: qty 2

## 2018-07-02 MED ORDER — MORPHINE SULFATE (PF) 4 MG/ML IV SOLN
4.0000 mg | Freq: Once | INTRAVENOUS | Status: AC
  Administered 2018-07-02: 4 mg via INTRAVENOUS
  Filled 2018-07-02: qty 1

## 2018-07-02 MED ORDER — IBUPROFEN 600 MG PO TABS: 600.0000 mg | ORAL_TABLET | Freq: Four times a day (QID) | ORAL | 0 refills | Status: DC | PRN

## 2018-07-02 MED ORDER — SODIUM CHLORIDE 0.9 % IV BOLUS
1000.0000 mL | Freq: Once | INTRAVENOUS | Status: AC
  Administered 2018-07-02: 1000 mL via INTRAVENOUS

## 2018-07-02 NOTE — ED Provider Notes (Signed)
[facility] Emergency Department Provider Note ____________________________________________   First MD Initiated Contact with Patient 07/02/18 1715     (approximate)  I have reviewed the triage vital signs and the nursing notes.   HISTORY  Chief Complaint Flank Pain and Hyperglycemia    HPI Emma Thomas is a 22 y.o. female with PMH as noted below including type I DM on insulin who presents with generalized weakness over the last several days, associated with left flank pain, fever, and headache.  Patient states it feels similar to prior kidney infections, and it feels similar to when she has had DKA.  Patient states that she has been compliant with her insulin but did not take it this morning.  Past Medical History:  Diagnosis Date  . Diabetes mellitus without complication ([place])    Type 1 DM    Patient Active Problem List   Diagnosis Date Noted  . Pyelonephritis 07/30/2016  . Sepsis ([place]) 06/08/2016  . DKA (diabetic ketoacidoses) ([place]) 09/09/2015    Past Surgical History:  Procedure Laterality Date  . abscess removal      Prior to Admission medications   Medication Sig Start Date End Date Taking? Authorizing Provider  amoxicillin-clavulanate (AUGMENTIN) 875-125 MG tablet Take 1 tablet by mouth every 12 (twelve) hours. 04/26/18   Coral Spikes, DO  cephALEXin (KEFLEX) 500 MG capsule Take 1 capsule (500 mg total) by mouth 3 (three) times daily for 10 days. 07/02/18 07/12/18  Arta Silence, MD  HUMALOG KWIKPEN 100 UNIT/ML KiwkPen Inject 0.04 mLs (4 Units total) into the skin 3 (three) times daily. 08/02/17   Epifanio Lesches, MD  HYDROcodone-acetaminophen (NORCO/VICODIN) 5-325 MG tablet Take 1 tablet by mouth every 6 (six) hours as needed. 04/26/18   Coral Spikes, DO  ibuprofen (ADVIL,MOTRIN) 600 MG tablet Take 1 tablet (600 mg total) by mouth every 6 (six) hours as needed. 07/02/18   Arta Silence, MD  insulin aspart (NOVOLOG) 100  UNIT/ML injection Inject 0-9 Units into the skin 3 (three) times daily with meals. 08/02/17   Epifanio Lesches, MD  insulin aspart (NOVOLOG) 100 UNIT/ML injection Inject 0-5 Units into the skin at bedtime. 08/02/17   Epifanio Lesches, MD  LEVEMIR FLEXTOUCH 100 UNIT/ML Pen Inject 16 Units into the skin daily at 8 pm. 08/02/17   Epifanio Lesches, MD  Levonorgestrel (SKYLA IU) 1 Device by Intrauterine route.    [provider]  ondansetron (ZOFRAN ODT) 8 MG disintegrating tablet Take 1 tablet (8 mg total) by mouth every 8 (eight) hours as needed for nausea or vomiting. 07/02/18   Arta Silence, MD  SUMAtriptan (IMITREX) 50 MG tablet Take 1 tablet by mouth as needed. 06/11/17   [provider]    Allergies [name].  Family History  Problem Relation Age of Onset  . Breast cancer Mother   . Diabetes type II Brother     Social History Social History   Tobacco Use  . Smoking status: Never Smoker  . Smokeless tobacco: Never Used  Substance Use Topics  . Alcohol use: Not Currently    Alcohol/week: 2.0 standard drinks    Types: 2 Shots of liquor per week  . Drug use: No    Review of Systems  Constitutional: Positive for fever. Eyes: No redness. ENT: No sore throat. Cardiovascular: Denies chest pain. Respiratory: Denies shortness of breath. Gastrointestinal: Positive for nausea.  Genitourinary: Positive for hematuria. Musculoskeletal: Negative for back pain. Skin: Negative for rash. Neurological: Positive  for headache.   ____________________________________________   PHYSICAL EXAM:  VITAL SIGNS: ED Triage Vitals  Enc Vitals Group     BP 07/02/18 1701 111/77     Pulse Rate 07/02/18 1701 (!) 124     Resp 07/02/18 1701 18     Temp 07/02/18 1701 99.6 F (37.6 C)     Temp Source 07/02/18 1701 Oral     SpO2 07/02/18 1701 98 %     Weight 07/02/18 1655 130 lb (59 kg)     Height 07/02/18 1655 5\' 6"  (1.676 m)     Head  Circumference --      Peak Flow --      Pain Score 07/02/18 1655 10     Pain Loc --      Pain Edu? --      Excl. in [place]? --     Constitutional: Alert and oriented.  Uncomfortable appearing.   Eyes: Conjunctivae are normal.  Head: Atraumatic. Nose: No congestion/rhinnorhea. Mouth/Throat: Mucous membranes are dry.   Neck: Normal range of motion.  Cardiovascular: Tachycardic, regular rhythm. Grossly normal heart sounds.  Good peripheral circulation. Respiratory: Normal respiratory effort.  No retractions. Lungs CTAB. Gastrointestinal: Soft and nontender. No distention.  Genitourinary: Mild left CVA tenderness. Musculoskeletal:  Extremities warm and well perfused.  Neurologic:  Normal speech and language. No gross focal neurologic deficits are appreciated.  Skin:  Skin is warm and dry. No rash noted. Psychiatric: Mood and affect are normal. Speech and behavior are normal.  ____________________________________________   LABS (all labs ordered are listed, but only abnormal results are displayed)  Labs Reviewed  GLUCOSE, CAPILLARY - Abnormal; Notable for the following components:      Result Value   Glucose-Capillary 256 (*)    All other components within normal limits  BASIC METABOLIC PANEL - Abnormal; Notable for the following components:   Sodium 133 (*)    Chloride 95 (*)    Glucose, Bld 279 (*)    Calcium 8.7 (*)    All other components within normal limits  URINALYSIS, COMPLETE (UACMP) WITH MICROSCOPIC - Abnormal; Notable for the following components:   Color, Urine AMBER (*)    APPearance TURBID (*)    Glucose, UA >=500 (*)    Hgb urine dipstick LARGE (*)    Ketones, ur 20 (*)    Protein, ur 100 (*)    Leukocytes, UA LARGE (*)    WBC, UA >50 (*)    Bacteria, UA MANY (*)    All other components within normal limits  CBC  LACTIC ACID, PLASMA  POC URINE PREG, ED  POCT PREGNANCY, URINE  CBG MONITORING, ED    ____________________________________________  EKG   ____________________________________________  RADIOLOGY    ____________________________________________   PROCEDURES  Procedure(s) performed: No  Procedures  Critical Care performed: No ____________________________________________   INITIAL IMPRESSION / ASSESSMENT AND PLAN / ED COURSE  Pertinent labs & imaging results that were available during my care of the patient were reviewed by me and considered in my medical decision making (see chart for details).  22 year old female with PMH as noted above presents with generalized weakness, fever, left flank pain, and headache over the last few days.  The patient states that it is similar to prior episodes of pyelonephritis as well as DKA.  On exam, the patient is tachycardic with a low-grade temperature but otherwise normal vital signs.  She is uncomfortable appearing but in no acute distress.  The remainder of the exam is as  described above.  Differential includes pyelonephritis, cystitis, DKA, hyperglycemia, dehydration, other metabolic etiology, or less likely other source of infection.  Will obtain labs and UA, give fluids, and reassess.  ----------------------------------------- 8:22 PM on 07/02/2018 -----------------------------------------  The UA is consistent with UTI, so clinically the presentation is consistent with pyelonephritis.  The patient's lab work-up is otherwise reassuring, with no elevated anion gap or other evidence of DKA.  The lactate is negative.  Her vital signs have significantly improved after fluids, with heart rate now in the 90s.  She feels much more comfortable, and states that she feels well to go home.  At this time given that her vital signs are stable, she has no evidence of sepsis, and is tolerating p.o., she is appropriate for discharge home with outpatient robotics.  A dose of ceftriaxone was given in the ED, and will continue with Keflex  at home.  Return precautions given, and the patient expresses understanding. ____________________________________________   FINAL CLINICAL IMPRESSION(S) / ED DIAGNOSES  Final diagnoses:  Pyelonephritis      NEW MEDICATIONS STARTED DURING THIS VISIT:  New Prescriptions   CEPHALEXIN (KEFLEX) 500 MG CAPSULE    Take 1 capsule (500 mg total) by mouth 3 (three) times daily for 10 days.   IBUPROFEN (ADVIL,MOTRIN) 600 MG TABLET    Take 1 tablet (600 mg total) by mouth every 6 (six) hours as needed.   ONDANSETRON (ZOFRAN ODT) 8 MG DISINTEGRATING TABLET    Take 1 tablet (8 mg total) by mouth every 8 (eight) hours as needed for nausea or vomiting.     Note:  This document was prepared using Dragon voice recognition software and may include unintentional dictation errors.    Arta Silence, MD 07/02/18 2024

## 2018-07-02 NOTE — ED Notes (Signed)
CBG at triage 254.

## 2018-07-02 NOTE — Discharge Instructions (Addendum)
Take the antibiotic as prescribed starting tomorrow morning and finish the full course.  You may take the ibuprofen or over-the-counter Tylenol as needed for pain or fever.  You can use the ondansetron as needed for nausea.  Make sure that you continue to take your insulin according to your normal regimen.  Return to the ER immediately for new, worsening, persistent severe pain, vomiting or inability to take the antibiotic, fevers, weakness, persistent high blood sugar levels, or any other new or worsening symptoms that concern you.

## 2018-07-02 NOTE — ED Triage Notes (Addendum)
Pt presents to ED with c/o headache, fever 103.4 and left flank pain. Pt states taking 1 Alleve (200mg ) at around 9am this morning. Pt states " I have diabetes and I feel like I am in DKA" Pt states taking her blood sugar last night which was unreadable. Pt states, she has not taking her blood sugar/ insulin this morning. Pt states she is feeling weak and sleepy; feeling nauseous/ dizzy and not able to eat anything since last night.   NAD noted.

## 2018-07-02 NOTE — ED Notes (Addendum)
Pt states that she thinks she has a kidney infection. Her left side has been hurting for past three days and she has a bad HA. No appetite x 3 days. Pt is sitting quietly in dark when I entered room.

## 2018-08-07 ENCOUNTER — Encounter: Payer: Self-pay | Admitting: Emergency Medicine

## 2018-08-07 ENCOUNTER — Emergency Department: Payer: Self-pay

## 2018-08-07 ENCOUNTER — Inpatient Hospital Stay
Admission: EM | Admit: 2018-08-07 | Discharge: 2018-08-08 | DRG: 638 | Disposition: A | Discharge status: Home / Self Care | Payer: Self-pay | Attending: Internal Medicine | Admitting: Internal Medicine

## 2018-08-07 ENCOUNTER — Other Ambulatory Visit: Payer: Self-pay

## 2018-08-07 DIAGNOSIS — E1010 Type 1 diabetes mellitus with ketoacidosis without coma: Principal | ICD-10-CM | POA: Diagnosis present

## 2018-08-07 DIAGNOSIS — R945 Abnormal results of liver function studies: Secondary | ICD-10-CM

## 2018-08-07 DIAGNOSIS — E1110 Type 2 diabetes mellitus with ketoacidosis without coma: Secondary | ICD-10-CM | POA: Diagnosis present

## 2018-08-07 DIAGNOSIS — E860 Dehydration: Secondary | ICD-10-CM | POA: Diagnosis present

## 2018-08-07 DIAGNOSIS — R109 Unspecified abdominal pain: Secondary | ICD-10-CM

## 2018-08-07 DIAGNOSIS — E8729 Other acidosis: Secondary | ICD-10-CM

## 2018-08-07 DIAGNOSIS — Z794 Long term (current) use of insulin: Secondary | ICD-10-CM

## 2018-08-07 DIAGNOSIS — Z833 Family history of diabetes mellitus: Secondary | ICD-10-CM

## 2018-08-07 DIAGNOSIS — N179 Acute kidney failure, unspecified: Secondary | ICD-10-CM

## 2018-08-07 DIAGNOSIS — Z803 Family history of malignant neoplasm of breast: Secondary | ICD-10-CM

## 2018-08-07 DIAGNOSIS — E872 Acidosis: Secondary | ICD-10-CM

## 2018-08-07 DIAGNOSIS — E875 Hyperkalemia: Secondary | ICD-10-CM | POA: Diagnosis present

## 2018-08-07 DIAGNOSIS — T383X6A Underdosing of insulin and oral hypoglycemic [antidiabetic] drugs, initial encounter: Secondary | ICD-10-CM | POA: Diagnosis present

## 2018-08-07 DIAGNOSIS — Z91120 Patient's intentional underdosing of medication regimen due to financial hardship: Secondary | ICD-10-CM

## 2018-08-07 LAB — CBC WITH DIFFERENTIAL/PLATELET
BASOS PCT: 1 %
Basophils Absolute: 0.2 10*3/uL — ABNORMAL HIGH (ref 0–0.1)
Eosinophils Absolute: 0 10*3/uL (ref 0–0.7)
Eosinophils Relative: 0 %
HEMATOCRIT: 37.3 % (ref 35.0–47.0)
HEMOGLOBIN: 12.1 g/dL (ref 12.0–16.0)
LYMPHS ABS: 1.3 10*3/uL (ref 1.0–3.6)
LYMPHS PCT: 9 %
MCH: 30.7 pg (ref 26.0–34.0)
MCHC: 32.6 g/dL (ref 32.0–36.0)
MCV: 94.3 fL (ref 80.0–100.0)
MONO ABS: 0.7 10*3/uL (ref 0.2–0.9)
MONOS PCT: 5 %
NEUTROS ABS: 12.8 10*3/uL — AB (ref 1.4–6.5)
Neutrophils Relative %: 85 %
Platelets: 578 10*3/uL — ABNORMAL HIGH (ref 150–440)
RBC: 3.96 MIL/uL (ref 3.80–5.20)
RDW: 13.4 % (ref 11.5–14.5)
WBC: 14.9 10*3/uL — ABNORMAL HIGH (ref 3.6–11.0)

## 2018-08-07 LAB — URINALYSIS, COMPLETE (UACMP) WITH MICROSCOPIC
Bacteria, UA: NONE SEEN
Bilirubin Urine: NEGATIVE
KETONES UR: 80 mg/dL — AB
Leukocytes, UA: NEGATIVE
Nitrite: NEGATIVE
PH: 5 (ref 5.0–8.0)
Protein, ur: 30 mg/dL — AB
SPECIFIC GRAVITY, URINE: 1.021 (ref 1.005–1.030)

## 2018-08-07 LAB — BASIC METABOLIC PANEL
Anion gap: 9 (ref 5–15)
Anion gap: 7 (ref 5–15)
BUN: 15 mg/dL (ref 6–20)
BUN: 14 mg/dL (ref 6–20)
CALCIUM: 8.5 mg/dL — AB (ref 8.9–10.3)
CALCIUM: 8.5 mg/dL — AB (ref 8.9–10.3)
CO2: 18 mmol/L — AB (ref 22–32)
CO2: 19 mmol/L — ABNORMAL LOW (ref 22–32)
CREATININE: 0.76 mg/dL (ref 0.44–1.00)
CREATININE: 0.65 mg/dL (ref 0.44–1.00)
Chloride: 107 mmol/L (ref 98–111)
Chloride: 109 mmol/L (ref 98–111)
GFR calc Af Amer: >60 mL/min (ref >60)
GFR calc Af Amer: >60 mL/min (ref >60)
GFR calc non Af Amer: >60 mL/min (ref >60)
GFR calc non Af Amer: >60 mL/min (ref >60)
GLUCOSE: 247 mg/dL — AB (ref 70–99)
GLUCOSE: 134 mg/dL — AB (ref 70–99)
Potassium: 4.2 mmol/L (ref 3.5–5.1)
Potassium: 4 mmol/L (ref 3.5–5.1)
Sodium: 134 mmol/L — ABNORMAL LOW (ref 135–145)
Sodium: 135 mmol/L (ref 135–145)

## 2018-08-07 LAB — BLOOD GAS, VENOUS
Patient temperature: 37
pCO2, Ven: 25 mmHg — ABNORMAL LOW (ref 44.0–60.0)
pH, Ven: 7.15 — CL (ref 7.250–7.430)
pO2, Ven: 36 mmHg (ref 32.0–45.0)

## 2018-08-07 LAB — COMPREHENSIVE METABOLIC PANEL
ALBUMIN: 3 g/dL — AB (ref 3.5–5.0)
ALT: 11 U/L (ref 0–44)
ANION GAP: 24 — AB (ref 5–15)
AST: 13 U/L — AB (ref 15–41)
Alkaline Phosphatase: 160 U/L — ABNORMAL HIGH (ref 38–126)
BUN: 21 mg/dL — ABNORMAL HIGH (ref 6–20)
CHLORIDE: 92 mmol/L — AB (ref 98–111)
CO2: 9 mmol/L — ABNORMAL LOW (ref 22–32)
Calcium: 8.7 mg/dL — ABNORMAL LOW (ref 8.9–10.3)
Creatinine, Ser: 1.2 mg/dL — ABNORMAL HIGH (ref 0.44–1.00)
GFR calc Af Amer: >60 mL/min (ref >60)
GFR calc non Af Amer: >60 mL/min (ref >60)
GLUCOSE: 767 mg/dL — AB (ref 70–99)
POTASSIUM: 5.2 mmol/L — AB (ref 3.5–5.1)
SODIUM: 125 mmol/L — AB (ref 135–145)
TOTAL PROTEIN: 7.6 g/dL (ref 6.5–8.1)
Total Bilirubin: 2 mg/dL — ABNORMAL HIGH (ref 0.3–1.2)

## 2018-08-07 LAB — GLUCOSE, CAPILLARY
GLUCOSE-CAPILLARY: 265 mg/dL — AB (ref 70–99)
GLUCOSE-CAPILLARY: 200 mg/dL — AB (ref 70–99)
GLUCOSE-CAPILLARY: 127 mg/dL — AB (ref 70–99)
GLUCOSE-CAPILLARY: 159 mg/dL — AB (ref 70–99)
GLUCOSE-CAPILLARY: 141 mg/dL — AB (ref 70–99)
Glucose-Capillary: 535 mg/dL (ref 70–99)
Glucose-Capillary: 400 mg/dL — ABNORMAL HIGH (ref 70–99)
Glucose-Capillary: 300 mg/dL — ABNORMAL HIGH (ref 70–99)
Glucose-Capillary: 117 mg/dL — ABNORMAL HIGH (ref 70–99)
Glucose-Capillary: 129 mg/dL — ABNORMAL HIGH (ref 70–99)
Glucose-Capillary: 147 mg/dL — ABNORMAL HIGH (ref 70–99)

## 2018-08-07 LAB — MRSA PCR SCREENING: MRSA by PCR: NEGATIVE

## 2018-08-07 LAB — PHOSPHORUS: Phosphorus: 2 mg/dL — ABNORMAL LOW (ref 2.5–4.6)

## 2018-08-07 LAB — MAGNESIUM: Magnesium: 2 mg/dL (ref 1.7–2.4)

## 2018-08-07 LAB — POCT PREGNANCY, URINE: PREG TEST UR: NEGATIVE

## 2018-08-07 MED ORDER — SODIUM CHLORIDE 0.9 % IV BOLUS
1000.0000 mL | Freq: Once | INTRAVENOUS | Status: AC
  Administered 2018-08-07: 1000 mL via INTRAVENOUS

## 2018-08-07 MED ORDER — PROMETHAZINE HCL 25 MG/ML IJ SOLN
12.5000 mg | Freq: Four times a day (QID) | INTRAMUSCULAR | Status: DC | PRN
Start: 2018-08-07 — End: 2018-08-08
  Administered 2018-08-07: 12.5 mg via INTRAVENOUS
  Filled 2018-08-07: qty 1

## 2018-08-07 MED ORDER — SODIUM CHLORIDE 0.9 % IV SOLN
INTRAVENOUS | Status: DC
  Administered 2018-08-07: 14:00:00 via INTRAVENOUS

## 2018-08-07 MED ORDER — ACETAMINOPHEN 500 MG PO TABS
1000.0000 mg | ORAL_TABLET | Freq: Three times a day (TID) | ORAL | Status: DC | PRN
  Administered 2018-08-07 – 2018-08-08 (×2): 1000 mg via ORAL
  Filled 2018-08-07 (×2): qty 2

## 2018-08-07 MED ORDER — HYDROCODONE-ACETAMINOPHEN 5-325 MG PO TABS
1.0000 | ORAL_TABLET | Freq: Four times a day (QID) | ORAL | Status: DC | PRN
  Administered 2018-08-08: 1 via ORAL
  Filled 2018-08-07: qty 1

## 2018-08-07 MED ORDER — POTASSIUM CHLORIDE 10 MEQ/100ML IV SOLN
10.0000 meq | INTRAVENOUS | Status: DC
  Filled 2018-08-07 (×2): qty 100

## 2018-08-07 MED ORDER — SODIUM CHLORIDE 0.9 % IV SOLN: Freq: Once | INTRAVENOUS | Status: DC

## 2018-08-07 MED ORDER — ONDANSETRON HCL 4 MG PO TABS: 4.0000 mg | ORAL_TABLET | Freq: Four times a day (QID) | ORAL | Status: DC | PRN

## 2018-08-07 MED ORDER — SODIUM CHLORIDE 0.9 % IV SOLN: INTRAVENOUS | Status: DC

## 2018-08-07 MED ORDER — SUMATRIPTAN SUCCINATE 50 MG PO TABS
50.0000 mg | ORAL_TABLET | ORAL | Status: DC | PRN
  Administered 2018-08-08: 50 mg via ORAL
  Filled 2018-08-07 (×3): qty 1

## 2018-08-07 MED ORDER — ACETAMINOPHEN 650 MG RE SUPP: 650.0000 mg | Freq: Four times a day (QID) | RECTAL | Status: DC | PRN

## 2018-08-07 MED ORDER — ACETAMINOPHEN 325 MG PO TABS: 650.0000 mg | ORAL_TABLET | Freq: Four times a day (QID) | ORAL | Status: DC | PRN

## 2018-08-07 MED ORDER — DEXTROSE-NACL 5-0.45 % IV SOLN
INTRAVENOUS | Status: DC
  Administered 2018-08-07: 18:00:00 via INTRAVENOUS

## 2018-08-07 MED ORDER — FAMOTIDINE IN NACL 20-0.9 MG/50ML-% IV SOLN
20.0000 mg | Freq: Two times a day (BID) | INTRAVENOUS | Status: DC
  Administered 2018-08-07: 20 mg via INTRAVENOUS
  Filled 2018-08-07: qty 50

## 2018-08-07 MED ORDER — ONDANSETRON HCL 4 MG/2ML IJ SOLN: 4.0000 mg | Freq: Four times a day (QID) | INTRAMUSCULAR | Status: DC | PRN

## 2018-08-07 MED ORDER — ENOXAPARIN SODIUM 40 MG/0.4ML ~~LOC~~ SOLN
40.0000 mg | SUBCUTANEOUS | Status: DC
  Filled 2018-08-07: qty 0.4

## 2018-08-07 MED ORDER — SODIUM CHLORIDE 0.9 % IV SOLN
INTRAVENOUS | Status: DC
  Filled 2018-08-07: qty 1

## 2018-08-07 MED ORDER — SODIUM CHLORIDE 0.9 % IV SOLN
INTRAVENOUS | Status: DC
  Administered 2018-08-07: 5.4 [IU]/h via INTRAVENOUS
  Filled 2018-08-07: qty 1

## 2018-08-07 NOTE — H&P (Signed)
[place] [facility] [place] NAME: Emma Thomas    MR#:  [id number]  DATE OF BIRTH:  1996-05-30  DATE OF ADMISSION:  08/07/2018  PRIMARY CARE PHYSICIAN: Shari Prows, [facility]   REQUESTING/REFERRING PHYSICIAN: Merlyn Lot, MD  CHIEF COMPLAINT:   Abdominal pain, nausea vomiting and ran out of Levemir HISTORY OF PRESENT ILLNESS:  Emma Thomas  is a 22 y.o. female with a known history of insulin requiring diabetes mellitus is presenting to the ED with a chief complaint of nausea vomiting and abdominal pain.  Patient also stated that she ran out of her Levemir for the past 3 days and could not afford to buy it.  Sodium at 125 anion gap 24 bicarb at 9 with blood sugar at 760.  Patient is started on aggressive hydration with IV fluid boluses and insulin drip and hospitalist team is called to admit the patient.  Patient reports a nausea and vomiting improved after antinausea medications were given in the ED.  Lives with her boyfriend. PAST MEDICAL HISTORY:   Past Medical History:  Diagnosis Date  . Diabetes mellitus without complication ([place])    Type 1 DM    PAST SURGICAL HISTOIRY:   Past Surgical History:  Procedure Laterality Date  . abscess removal      SOCIAL HISTORY:   Social History   Tobacco Use  . Smoking status: Never Smoker  . Smokeless tobacco: Never Used  Substance Use Topics  . Alcohol use: Not Currently    Alcohol/week: 2.0 standard drinks    Types: 2 Shots of liquor per week    FAMILY HISTORY:   Family History  Problem Relation Age of Onset  . Breast cancer Mother   . Diabetes type II Brother     DRUG ALLERGIES:  No Known Allergies  REVIEW OF SYSTEMS:  CONSTITUTIONAL: No fever, reports generalized weakness EYES: No blurred or double vision.  EARS, NOSE, AND THROAT: No tinnitus or ear pain.  RESPIRATORY: No cough, shortness of breath, wheezing or hemoptysis.  CARDIOVASCULAR: No chest pain,  orthopnea, edema.  GASTROINTESTINAL: Improving nausea, vomiting, denies diarrhea or abdominal pain.  GENITOURINARY: No dysuria, hematuria.  ENDOCRINE: No polyuria, nocturia,  HEMATOLOGY: No anemia, easy bruising or bleeding SKIN: No rash or lesion. MUSCULOSKELETAL: No joint pain or arthritis.  Reporting back pain during my examination  nEUROLOGIC: No tingling, numbness, weakness.  PSYCHIATRY: No anxiety or depression.   MEDICATIONS AT HOME:   Prior to Admission medications   Medication Sig Start Date End Date Taking? Authorizing Provider  amoxicillin-clavulanate (AUGMENTIN) 875-125 MG tablet Take 1 tablet by mouth every 12 (twelve) hours. 04/26/18   Cook, Jayce G, DO  HUMALOG KWIKPEN 100 UNIT/ML KiwkPen Inject 0.04 mLs (4 Units total) into the skin 3 (three) times daily. 08/02/17   Epifanio Lesches, MD  HYDROcodone-acetaminophen (NORCO/VICODIN) 5-325 MG tablet Take 1 tablet by mouth every 6 (six) hours as needed. 04/26/18   Coral Spikes, DO  ibuprofen (ADVIL,MOTRIN) 600 MG tablet Take 1 tablet (600 mg total) by mouth every 6 (six) hours as needed. 07/02/18   Arta Silence, MD  insulin aspart (NOVOLOG) 100 UNIT/ML injection Inject 0-9 Units into the skin 3 (three) times daily with meals. 08/02/17   Epifanio Lesches, MD  insulin aspart (NOVOLOG) 100 UNIT/ML injection Inject 0-5 Units into the skin at bedtime. 08/02/17   Epifanio Lesches, MD  LEVEMIR FLEXTOUCH 100 UNIT/ML Pen Inject 16 Units into the skin daily at 8 pm. 08/02/17  Epifanio Lesches, MD  Levonorgestrel (SKYLA IU) 1 Device by Intrauterine route.    [provider]  ondansetron (ZOFRAN ODT) 8 MG disintegrating tablet Take 1 tablet (8 mg total) by mouth every 8 (eight) hours as needed for nausea or vomiting. 07/02/18   Arta Silence, MD  SUMAtriptan (IMITREX) 50 MG tablet Take 1 tablet by mouth as needed. 06/11/17   [provider]      VITAL SIGNS:  Blood pressure (!) 130/92, pulse (!) 110,  temperature 97.8 F (36.6 C), temperature source Oral, resp. rate (!) 22, height 5\' 6"  (1.676 m), weight 57.1 kg, SpO2 100 %.  PHYSICAL EXAMINATION:  GENERAL:  22 y.o.-year-old patient lying in the bed with no acute distress.  EYES: Pupils equal, round, reactive to light and accommodation. No scleral icterus. Extraocular muscles intact.  HEENT: Head atraumatic, normocephalic. Oropharynx and nasopharynx clear.  Dry mucous membranes NECK:  Supple, no jugular venous distention. No thyroid enlargement, no tenderness.  LUNGS: Normal breath sounds bilaterally, no wheezing, rales,rhonchi or crepitation. No use of accessory muscles of respiration.  CARDIOVASCULAR: S1, S2 normal. No murmurs, rubs, or gallops.  ABDOMEN: Soft, nontender, nondistended. Bowel sounds present.  EXTREMITIES: No pedal edema, cyanosis, or clubbing.  NEUROLOGIC: Cranial nerves II through XII are intact. Muscle strength global weakness in all extremities. Sensation intact. Gait not checked.  PSYCHIATRIC: The patient is alert and oriented x 3.  SKIN: No obvious rash, lesion, or ulcer.   LABORATORY PANEL:   CBC Recent Labs  Lab 08/07/18 0952  WBC 14.9*  HGB 12.1  HCT 37.3  PLT 578*   ------------------------------------------------------------------------------------------------------------------  Chemistries  Recent Labs  Lab 08/07/18 0953  NA 125*  K 5.2*  CL 92*  CO2 9*  GLUCOSE 767*  BUN 21*  CREATININE 1.20*  CALCIUM 8.7*  AST 13*  ALT 11  ALKPHOS 160*  BILITOT 2.0*   ------------------------------------------------------------------------------------------------------------------  Cardiac Enzymes No results for input(s): TROPONINI in the last 168 hours. ------------------------------------------------------------------------------------------------------------------  RADIOLOGY:  Dg Chest 2 View  Result Date: 08/07/2018 CLINICAL DATA:  Fever, cough EXAM: CHEST - 2 VIEW COMPARISON:  08/16/2017  FINDINGS: Lungs are clear.  No pleural effusion or pneumothorax. The heart is normal in size. Visualized osseous structures are within normal limits. IMPRESSION: Normal chest radiographs. Electronically Signed   By: Julian Hy M.D.   On: 08/07/2018 11:08    EKG:   Orders placed or performed during the hospital encounter of 08/07/18  . ED EKG  . ED EKG  . EKG 12-Lead  . EKG 12-Lead    IMPRESSION AND PLAN:    #DKA secondary to noncompliance with medication Admit to intensive care unit Insulin drip Monitor CBGs closely Serial BMPs Aggressive hydration with IV fluids And blood sugar is at 250 change IV fluids from normal saline to D5 half-normal with 20 KCl Diabetic coordinator consult placed Consult placed to case management regarding medication assistance mainly insulin  #Pseudohyponatremia from hyperglycemia Should be auto corrected with correction of hyperglycemia with insulin drip and IV fluids Monitor serial BMPs  #Hyperkalemia from underlying DKA Should be auto corrected with IV fluids and insulin Continue close monitoring  #AKI from dehydration IV fluids and monitor renal function closely and avoid nephrotoxins  #Back pain with nausea and vomiting Symptomatic treatment Urinalysis is normal and urine is negative for pregnancy test   All the records are reviewed and case discussed with ED provider. Management plans discussed with the patient, family and they are in agreement.  CODE STATUS:  fc   TOTAL CRITICAL CARE TIME TAKING CARE OF THIS PATIENT: 45 minutes.   Note: This dictation was prepared with Dragon dictation along with smaller phrase technology. Any transriptional errors that result from this process are unintentional.  Nicholes Mango M.D on 08/07/2018 at 2:03 PM  Between 7am to 6pm - Pager - [phone]  After 6pm go to [email or web address] - password [email or web address] [facility]  [facility]  Office  [phone]  CC: Primary care physician; [facility]

## 2018-08-07 NOTE — ED Notes (Signed)
Patient transported to Ultrasound 

## 2018-08-07 NOTE — Progress Notes (Addendum)
1800 Admitted to ICU at 1400 for DKA. Patient quickly dropped her blood sugar to 200 after Insulin drip titrated every hour. Now on D5NS. More alert and awake.

## 2018-08-07 NOTE — ED Notes (Signed)
Date and time results received: 08/07/18 1156 (use smartphrase ".now" to insert current time)  Test: CBG Critical Value: 767  Name of Provider Notified: Quentin Cornwall, MD  Orders Received? Or Actions Taken?: Actions Taken:  and no new orders at this time

## 2018-08-07 NOTE — ED Provider Notes (Addendum)
[facility] Emergency Department Provider Note    First MD Initiated Contact with Patient 08/07/18 [phone]     (approximate)  I have reviewed the triage vital signs and the nursing notes.   HISTORY  Chief Complaint Emesis    HPI Emma Thomas is a 22 y.o. female history of insulin-dependent diabetes presents the ER with 3 days of nausea generalized malaise low back pain and elevated blood sugars associated with nausea and vomiting.  Has been outside of her insulin for the past several days because she cannot afford it.  Did have fever to 101 yesterday.  Denies any dysuria.  Has had a nonproductive cough.  Denies any abdominal pain.   Denies any vaginal discharge or vaginal bleeding.   Past Medical History:  Diagnosis Date  . Diabetes mellitus without complication (HCC)    Type 1 DM   Family History  Problem Relation Age of Onset  . Breast cancer Mother   . Diabetes type II Brother    Past Surgical History:  Procedure Laterality Date  . abscess removal     Patient Active Problem List   Diagnosis Date Noted  . Pyelonephritis 07/30/2016  . Sepsis ([place]) 06/08/2016  . DKA (diabetic ketoacidoses) ([place]) 09/09/2015      Prior to Admission medications   Medication Sig Start Date End Date Taking? Authorizing Provider  amoxicillin-clavulanate (AUGMENTIN) 875-125 MG tablet Take 1 tablet by mouth every 12 (twelve) hours. 04/26/18   Cook, Jayce G, DO  HUMALOG KWIKPEN 100 UNIT/ML KiwkPen Inject 0.04 mLs (4 Units total) into the skin 3 (three) times daily. 08/02/17   Epifanio Lesches, MD  HYDROcodone-acetaminophen (NORCO/VICODIN) 5-325 MG tablet Take 1 tablet by mouth every 6 (six) hours as needed. 04/26/18   Coral Spikes, DO  ibuprofen (ADVIL,MOTRIN) 600 MG tablet Take 1 tablet (600 mg total) by mouth every 6 (six) hours as needed. 07/02/18   Arta Silence, MD  insulin aspart (NOVOLOG) 100 UNIT/ML injection Inject 0-9 Units into the skin 3 (three)  times daily with meals. 08/02/17   Epifanio Lesches, MD  insulin aspart (NOVOLOG) 100 UNIT/ML injection Inject 0-5 Units into the skin at bedtime. 08/02/17   Epifanio Lesches, MD  LEVEMIR FLEXTOUCH 100 UNIT/ML Pen Inject 16 Units into the skin daily at 8 pm. 08/02/17   Epifanio Lesches, MD  Levonorgestrel (SKYLA IU) 1 Device by Intrauterine route.    [provider]  ondansetron (ZOFRAN ODT) 8 MG disintegrating tablet Take 1 tablet (8 mg total) by mouth every 8 (eight) hours as needed for nausea or vomiting. 07/02/18   Arta Silence, MD  SUMAtriptan (IMITREX) 50 MG tablet Take 1 tablet by mouth as needed. 06/11/17   [provider]    Allergies [name].    Social History Social History   Tobacco Use  . Smoking status: Never Smoker  . Smokeless tobacco: Never Used  Substance Use Topics  . Alcohol use: Not Currently    Alcohol/week: 2.0 standard drinks    Types: 2 Shots of liquor per week  . Drug use: No    Review of Systems Patient denies headaches, rhinorrhea, blurry vision, numbness, shortness of breath, chest pain, edema, cough, abdominal pain, nausea, vomiting, diarrhea, dysuria, fevers, rashes or hallucinations unless otherwise stated above in HPI. ____________________________________________   PHYSICAL EXAM:  VITAL SIGNS: Vitals:   08/07/18 1300 08/07/18 1401  BP: (!) 130/92   Pulse: (!) 110   Resp: (!) 22   Temp:  97.8 F (36.6 C)  SpO2: 100%     Constitutional: Alert and oriented.  Eyes: Conjunctivae are normal.  Head: Atraumatic. Nose: No congestion/rhinnorhea. Mouth/Throat: Mucous membranes are moist.   Neck: No stridor. Painless ROM.  Cardiovascular: tachycardic, regular rhythm. Grossly normal heart sounds.  Good peripheral circulation. Respiratory: Normal respiratory effort.  No retractions. Lungs CTAB. Gastrointestinal: Soft and nontender. No distention. No abdominal bruits. No CVA  tenderness. Genitourinary: deferred Musculoskeletal: No lower extremity tenderness nor edema.  No joint effusions. Neurologic:  Normal speech and language. No gross focal neurologic deficits are appreciated. No facial droop Skin:  Skin is warm, dry and intact. No rash noted. Psychiatric: Mood and affect are normal. Speech and behavior are normal.  ____________________________________________   LABS (all labs ordered are listed, but only abnormal results are displayed)  Results for orders placed or performed during the hospital encounter of 08/07/18 (from the past 24 hour(s))  Glucose, capillary     Status: Abnormal   Collection Time: 08/07/18  9:02 AM  Result Value Ref Range   Glucose-Capillary >600 (HH) 70 - 99 mg/dL  Blood gas, venous     Status: Abnormal   Collection Time: 08/07/18  9:18 AM  Result Value Ref Range   pH, Ven 7.15 (LL) 7.250 - 7.430   pCO2, Ven 25 (L) 44.0 - 60.0 mmHg   pO2, Ven 36.0 32.0 - 45.0 mmHg   Patient temperature 37.0    Collection site VENOUS    Sample type VENOUS   Urinalysis, Complete w Microscopic     Status: Abnormal   Collection Time: 08/07/18  9:52 AM  Result Value Ref Range   Color, Urine STRAW (A) YELLOW   APPearance HAZY (A) CLEAR   Specific Gravity, Urine 1.021 1.005 - 1.030   pH 5.0 5.0 - 8.0   Glucose, UA >=500 (A) NEGATIVE mg/dL   Hgb urine dipstick MODERATE (A) NEGATIVE   Bilirubin Urine NEGATIVE NEGATIVE   Ketones, ur 80 (A) NEGATIVE mg/dL   Protein, ur 30 (A) NEGATIVE mg/dL   Nitrite NEGATIVE NEGATIVE   Leukocytes, UA NEGATIVE NEGATIVE   RBC / HPF 0-5 0 - 5 RBC/hpf   WBC, UA 21-50 0 - 5 WBC/hpf   Bacteria, UA NONE SEEN NONE SEEN   Squamous Epithelial / LPF 0-5 0 - 5   Mucus PRESENT   CBC with Differential/Platelet     Status: Abnormal   Collection Time: 08/07/18  9:52 AM  Result Value Ref Range   WBC 14.9 (H) 3.6 - 11.0 K/uL   RBC 3.96 3.80 - 5.20 MIL/uL   Hemoglobin 12.1 12.0 - 16.0 g/dL   HCT 37.3 35.0 - 47.0 %   MCV  94.3 80.0 - 100.0 fL   MCH 30.7 26.0 - 34.0 pg   MCHC 32.6 32.0 - 36.0 g/dL   RDW 13.4 11.5 - 14.5 %   Platelets 578 (H) 150 - 440 K/uL   Neutrophils Relative % 85 %   Neutro Abs 12.8 (H) 1.4 - 6.5 K/uL   Lymphocytes Relative 9 %   Lymphs Abs 1.3 1.0 - 3.6 K/uL   Monocytes Relative 5 %   Monocytes Absolute 0.7 0.2 - 0.9 K/uL   Eosinophils Relative 0 %   Eosinophils Absolute 0.0 0 - 0.7 K/uL   Basophils Relative 1 %   Basophils Absolute 0.2 (H) 0 - 0.1 K/uL  Comprehensive metabolic panel     Status: Abnormal   Collection Time: 08/07/18  9:53 AM  Result Value Ref  Range   Sodium 125 (L) 135 - 145 mmol/L   Potassium 5.2 (H) 3.5 - 5.1 mmol/L   Chloride 92 (L) 98 - 111 mmol/L   CO2 9 (L) 22 - 32 mmol/L   Glucose, Bld 767 (HH) 70 - 99 mg/dL   BUN 21 (H) 6 - 20 mg/dL   Creatinine, Ser 1.20 (H) 0.44 - 1.00 mg/dL   Calcium 8.7 (L) 8.9 - 10.3 mg/dL   Total Protein 7.6 6.5 - 8.1 g/dL   Albumin 3.0 (L) 3.5 - 5.0 g/dL   AST 13 (L) 15 - 41 U/L   ALT 11 0 - 44 U/L   Alkaline Phosphatase 160 (H) 38 - 126 U/L   Total Bilirubin 2.0 (H) 0.3 - 1.2 mg/dL   GFR calc non Af Amer >60 >60 mL/min   GFR calc Af Amer >60 >60 mL/min   Anion gap 24 (H) 5 - 15  Pregnancy, urine POC     Status: None   Collection Time: 08/07/18 10:21 AM  Result Value Ref Range   Preg Test, Ur NEGATIVE NEGATIVE  Glucose, capillary     Status: Abnormal   Collection Time: 08/07/18  1:00 PM  Result Value Ref Range   Glucose-Capillary 535 (HH) 70 - 99 mg/dL   Comment 1 Document in Chart    ____________________________________________  EKG My review and personal interpretation at Time: 10:22   Indication: N/V  Rate: 115 Rhythm: sinus Axis: normal Other: normal intervals, no stemi ____________________________________________  RADIOLOGY  I personally reviewed all radiographic images ordered to evaluate for the above acute complaints and reviewed radiology reports and findings.  These findings were personally discussed  with the patient.  Please see medical record for radiology report.  ____________________________________________   PROCEDURES  Procedure(s) performed:  .Critical Care Performed by: Merlyn Lot, MD Authorized by: Merlyn Lot, MD   Critical care provider statement:    Critical care time (minutes):  45   Critical care was necessary to treat or prevent imminent or life-threatening deterioration of the following conditions:  Metabolic crisis and endocrine crisis   Critical care was time spent personally by me on the following activities:  Discussions with consultants, evaluation of patient's response to treatment, examination of patient, ordering and performing treatments and interventions, ordering and review of laboratory studies, ordering and review of radiographic studies, pulse oximetry, re-evaluation of patient's condition, obtaining history from patient or surrogate and review of old charts      Critical Care performed: yes ____________________________________________   INITIAL IMPRESSION / [place] / ED COURSE  Pertinent labs & imaging results that were available during my care of the patient were reviewed by me and considered in my medical decision making (see chart for details).   DDX: dka, pyelo, pna, sepsis, dehydration, pregnancy  Emma Thomas is a 22 y.o. who presents to the ED with symptoms as described above.  Patient afebrile but is tachycardic and ill-appearing.  Will provide IV fluids.  Her abdominal exam is soft and benign.  Sign concerning for possible pneumonia versus possible UTI [place].  Still have high suspicion and concern for DKA therefore will give aggressive IV fluids.  The patient will be placed on continuous pulse oximetry and telemetry for monitoring.  Laboratory evaluation will be sent to evaluate for the above complaints.     Clinical Course as of Aug 07 1402  Sun Aug 07, 2018  1103 Blood work is consistent with DKA.  Will  continue with IV fluid  resuscitation and initiate insulin drip.   [PR]  1217 Patient with significant acidosis consistent with DKA.  Hemodynamics stabilizing on IV fluids and insulin drip.  Patient will be admitted to the hospital for further medical management.   [PR]    Clinical Course User Index [PR] Merlyn Lot, MD     As part of my medical decision making, I reviewed the following data within the [place] notes reviewed and incorporated, Labs reviewed, notes from prior ED visits and [place] Controlled Substance Database   ____________________________________________   FINAL CLINICAL IMPRESSION(S) / ED DIAGNOSES  Final diagnoses:  Diabetic ketoacidosis without coma associated with type 1 diabetes mellitus (HCC)  High anion gap metabolic acidosis      NEW MEDICATIONS STARTED DURING THIS VISIT:  Current Discharge Medication List       Note:  This document was prepared using Dragon voice recognition software and may include unintentional dictation errors.    Merlyn Lot, MD 08/07/18 1356    Merlyn Lot, MD 08/07/18 [phone]

## 2018-08-07 NOTE — ED Notes (Signed)
MD at bedside. 

## 2018-08-07 NOTE — ED Notes (Signed)
Pt refuses DG Chest. Pt states "I need to know why do I need to get a chest X-ray if I am in DKA".  This RN explained pt the reason for obtaining a DG chest.

## 2018-08-07 NOTE — ED Notes (Signed)
Pressure bag applied per EDP order.

## 2018-08-07 NOTE — ED Notes (Addendum)
Pt states "I have not taken insulin since 3 days ago, because I don't have insurance and can't pay for it". Pt c/o lower back and L/flank pain 8/10; N/V. Denies SOB/dizziness.

## 2018-08-07 NOTE — ED Notes (Signed)
Patient transported to X-ray 

## 2018-08-07 NOTE — Consult Note (Signed)
[facility] Consultation      Name: Emma Thomas MRN: [id number] DOB: 02-20-1996    ADMISSION DATE:  08/07/2018 CONSULTATION DATE: 08/07/18  REFERRING MD :  Ricka Burdock   CHIEF COMPLAINT:    nausea vomiting with abd pain and ran out of [place]  Emma Thomas is a 28 old woman with known h/o IDDM is admitted thru ER with DKA. She presented to ER today with c/o nausea vomiting x 1 day .She also c/o L flank pain for about 2 weeks. She was treated for UTI 4 weeks ago and her flank pain had resolved but it started 2 weeks ago again.she reports fever of 100.2 yesterday. She reports chronic dry cough without sputum.  She reports that she ran out of levemir 3 days ago as she couldn't support it.she was found to have BG >700 with co2 of 9 and AG od 24,.She was started on IVF, insulin and transferred to ICU. She     SIGNIFICANT EVENTS      PAST MEDICAL HISTORY    :  Past Medical History:  Diagnosis Date  . Diabetes mellitus without complication ([place])    Type 1 DM   Past Surgical History:  Procedure Laterality Date  . abscess removal     Prior to Admission medications   Medication Sig Start Date End Date Taking? Authorizing Provider  amoxicillin-clavulanate (AUGMENTIN) 875-125 MG tablet Take 1 tablet by mouth every 12 (twelve) hours. 04/26/18   Cook, Jayce G, DO  HUMALOG KWIKPEN 100 UNIT/ML KiwkPen Inject 0.04 mLs (4 Units total) into the skin 3 (three) times daily. 08/02/17   Epifanio Lesches, MD  HYDROcodone-acetaminophen (NORCO/VICODIN) 5-325 MG tablet Take 1 tablet by mouth every 6 (six) hours as needed. 04/26/18   Coral Spikes, DO  ibuprofen (ADVIL,MOTRIN) 600 MG tablet Take 1 tablet (600 mg total) by mouth every 6 (six) hours as needed. 07/02/18   Arta Silence, MD  insulin aspart (NOVOLOG) 100 UNIT/ML injection Inject 0-9 Units into the skin 3 (three) times daily with meals. 08/02/17   Epifanio Lesches, MD    insulin aspart (NOVOLOG) 100 UNIT/ML injection Inject 0-5 Units into the skin at bedtime. 08/02/17   Epifanio Lesches, MD  LEVEMIR FLEXTOUCH 100 UNIT/ML Pen Inject 16 Units into the skin daily at 8 pm. 08/02/17   Epifanio Lesches, MD  Levonorgestrel (SKYLA IU) 1 Device by Intrauterine route.    [provider]  ondansetron (ZOFRAN ODT) 8 MG disintegrating tablet Take 1 tablet (8 mg total) by mouth every 8 (eight) hours as needed for nausea or vomiting. 07/02/18   Arta Silence, MD  SUMAtriptan (IMITREX) 50 MG tablet Take 1 tablet by mouth as needed. 06/11/17   [provider]   No Known Allergies   FAMILY HISTORY   Family History  Problem Relation Age of Onset  . Breast cancer Mother   . Diabetes type II Brother       SOCIAL HISTORY    reports that she has never smoked. She has never used smokeless tobacco. She reports that she drank about 2.0 standard drinks of alcohol per week. She reports that she does not use drugs.  ROS  Chronic headaches. No seizures. No diarrhea,melena or hematmesis. No dysuria,no sob,no CP.no rashes or joint pain    VITAL SIGNS    Temp:  [97.8 F (36.6 C)-98.5 F (36.9 C)] 97.8 F (36.6 C) (09/22 1401) Pulse Rate:  [108-118] 117 (09/22 1401) Resp:  [  16-25] 22 (09/22 1300) BP: (109-144)/(79-95) 109/90 (09/22 1401) SpO2:  [95 %-100 %] 95 % (09/22 1401) Weight:  [57.1 kg-61.2 kg] 57.1 kg (09/22 1358) HEMODYNAMICS:   VENTILATOR SETTINGS:   INTAKE / OUTPUT:  Intake/Output Summary (Last 24 hours) at 08/07/2018 1605 Last data filed at 08/07/2018 1522 Gross per 24 hour  Intake -  Output 325 ml  Net -325 ml       PHYSICAL EXAM   Physical Exam     LABS   LABS:  CBC Recent Labs  Lab 08/07/18 0952  WBC 14.9*  HGB 12.1  HCT 37.3  PLT 578*   Coag's No results for input(s): APTT, INR in the last 168 hours. BMET Recent Labs  Lab 08/07/18 0953  NA 125*  K 5.2*  CL 92*  CO2 9*  BUN 21*   CREATININE 1.20*  GLUCOSE 767*   Electrolytes Recent Labs  Lab 08/07/18 0953  CALCIUM 8.7*   Sepsis Markers No results for input(s): LATICACIDVEN, PROCALCITON, O2SATVEN in the last 168 hours. ABG No results for input(s): PHART, PCO2ART, PO2ART in the last 168 hours. Liver Enzymes Recent Labs  Lab 08/07/18 0953  AST 13*  ALT 11  ALKPHOS 160*  BILITOT 2.0*  ALBUMIN 3.0*   Cardiac Enzymes No results for input(s): TROPONINI, PROBNP in the last 168 hours. Glucose Recent Labs  Lab 08/07/18 0902 08/07/18 1300 08/07/18 1409 08/07/18 1517  GLUCAP >600* 535* 400* 300*     Recent Results (from the past 240 hour(s))  MRSA PCR Screening     Status: None   Collection Time: 08/07/18  2:03 PM  Result Value Ref Range Status   MRSA by PCR NEGATIVE NEGATIVE Final    Comment:        The GeneXpert MRSA Assay (FDA approved for NASAL specimens only), is one component of a comprehensive MRSA colonization surveillance program. It is not intended to diagnose MRSA infection nor to guide or monitor treatment for MRSA infections. Performed at [facility], [address], [place], [place] [address]      Current Facility-Administered Medications:  .  0.9 %  sodium chloride infusion, , Intravenous, Continuous, Gouru, Aruna, MD, Last Rate: 200 mL/hr at 08/07/18 1407 .  acetaminophen (TYLENOL) tablet 1,000 mg, 1,000 mg, Oral, Q8H PRN, 1,000 mg at 08/07/18 1525 **OR** acetaminophen (TYLENOL) suppository 650 mg, 650 mg, Rectal, Q6H PRN, Rosine Door, MD .  enoxaparin (LOVENOX) injection 40 mg, 40 mg, Subcutaneous, Q24H, Gouru, Aruna, MD .  HYDROcodone-acetaminophen (NORCO/VICODIN) 5-325 MG per tablet 1 tablet, 1 tablet, Oral, Q6H PRN, Gouru, Aruna, MD .  insulin regular (NOVOLIN R,HUMULIN R) 100 Units in sodium chloride 0.9 % 100 mL (1 Units/mL) infusion, , Intravenous, Continuous, Gouru, Aruna, MD, Last Rate: 4.8 mL/hr at 08/07/18 1524, 4.8 Units/hr at 08/07/18 1524 .   ondansetron (ZOFRAN) tablet 4 mg, 4 mg, Oral, Q6H PRN **OR** ondansetron (ZOFRAN) injection 4 mg, 4 mg, Intravenous, Q6H PRN, Gouru, Aruna, MD .  promethazine (PHENERGAN) injection 12.5 mg, 12.5 mg, Intravenous, Q6H PRN, Gouru, Aruna, MD, 12.5 mg at 08/07/18 1134 .  SUMAtriptan (IMITREX) tablet 50 mg, 50 mg, Oral, PRN, Nicholes Mango, MD  IMAGING    Dg Chest 2 View  Result Date: 08/07/2018 CLINICAL DATA:  Fever, cough EXAM: CHEST - 2 VIEW COMPARISON:  08/16/2017 FINDINGS: Lungs are clear.  No pleural effusion or pneumothorax. The heart is normal in size. Visualized osseous structures are within normal limits. IMPRESSION: Normal chest radiographs. Electronically Signed   By:  Julian Hy M.D.   On: 08-19-18 11:08        MICRO DATA: MRSA PCR  UA clear Blood Resp   ANTIMICROBIALS:     ASSESSMENT/PLAN   1.DKA secondary to noncompliance with medications Insulin drip as per DKA protocol. Monitor CBGs as per DKA protocolSerial BMPs Aggressive hydration with IV fluids At blood sugar > 250 change IV fluids from normal saline to D5 half-normal with 20 KCl Diabetic coordinator and case management  Consulted   2.Pseudohyponatremia related  hyperglycemia Cont IVF  3.Hyperkalemia from underlying DKA Should be auto corrected with IV fluids and insulin Continue close monitoring  4.AKI from dehydration IV fluids and monitor renal function closely and avoid nephrotoxins  5.Back pain with nausea and vomiting. No evidence of UTI Symptomatic treatment with tyelonol Urinalysis is normal and urine is negative for pregnancy test  6. Elevated LFT,s , follow for now  Full code, IV h2 blockers Lovenox for DVT-P     I have personally obtained a history, examined the patient, evaluated laboratory and independently reviewed  imaging results, formulated the assessment and plan and placed orders.  The Patient requires high complexity decision making for assessment and support,  frequent evaluation and titration of therapies, application of advanced monitoring technologies and extensive interpretation of multiple databases. Critical Care Time devoted to patient care services described in this note is 70 minutes.   Overall, patient is critically ill, prognosis is guarded. Patient at high risk for cardiac arrest and death.    Rosine Door, MD  19-Aug-2018 4:05 PM [facility]

## 2018-08-07 NOTE — ED Triage Notes (Addendum)
C/O fever yesterday and emesis today.  Patient states she believes she is DKA.  Patient states she doesn't have any insurance and has not had any insulin in 3 days.  Sates has been taking Levimir 16 u nits QPM and Humalog SS before meals.

## 2018-08-08 DIAGNOSIS — E1010 Type 1 diabetes mellitus with ketoacidosis without coma: Principal | ICD-10-CM

## 2018-08-08 LAB — CBC
HCT: 31.2 % — ABNORMAL LOW (ref 35.0–47.0)
HEMOGLOBIN: 11 g/dL — AB (ref 12.0–16.0)
MCH: 31.4 pg (ref 26.0–34.0)
MCHC: 35.4 g/dL (ref 32.0–36.0)
MCV: 88.6 fL (ref 80.0–100.0)
Platelets: 519 10*3/uL — ABNORMAL HIGH (ref 150–440)
RBC: 3.52 MIL/uL — ABNORMAL LOW (ref 3.80–5.20)
RDW: 13.2 % (ref 11.5–14.5)
WBC: 14 10*3/uL — ABNORMAL HIGH (ref 3.6–11.0)

## 2018-08-08 LAB — BASIC METABOLIC PANEL
ANION GAP: 6 (ref 5–15)
BUN: 9 mg/dL (ref 6–20)
CHLORIDE: 108 mmol/L (ref 98–111)
CO2: 21 mmol/L — ABNORMAL LOW (ref 22–32)
CREATININE: 0.7 mg/dL (ref 0.44–1.00)
Calcium: 8.1 mg/dL — ABNORMAL LOW (ref 8.9–10.3)
GFR calc non Af Amer: >60 mL/min (ref >60)
Glucose, Bld: 165 mg/dL — ABNORMAL HIGH (ref 70–99)
POTASSIUM: 3.5 mmol/L (ref 3.5–5.1)
SODIUM: 135 mmol/L (ref 135–145)

## 2018-08-08 LAB — COMPREHENSIVE METABOLIC PANEL
ALBUMIN: 2.8 g/dL — AB (ref 3.5–5.0)
ALK PHOS: 139 U/L — AB (ref 38–126)
ALT: 8 U/L (ref 0–44)
ANION GAP: 8 (ref 5–15)
AST: 10 U/L — AB (ref 15–41)
BUN: 10 mg/dL (ref 6–20)
CALCIUM: 8.4 mg/dL — AB (ref 8.9–10.3)
CO2: 20 mmol/L — AB (ref 22–32)
Chloride: 107 mmol/L (ref 98–111)
Creatinine, Ser: 0.72 mg/dL (ref 0.44–1.00)
GFR calc Af Amer: >60 mL/min (ref >60)
GFR calc non Af Amer: >60 mL/min (ref >60)
GLUCOSE: 174 mg/dL — AB (ref 70–99)
Potassium: 3.9 mmol/L (ref 3.5–5.1)
SODIUM: 135 mmol/L (ref 135–145)
Total Bilirubin: 0.8 mg/dL (ref 0.3–1.2)
Total Protein: 7.2 g/dL (ref 6.5–8.1)

## 2018-08-08 LAB — GLUCOSE, CAPILLARY
GLUCOSE-CAPILLARY: 163 mg/dL — AB (ref 70–99)
GLUCOSE-CAPILLARY: 89 mg/dL (ref 70–99)
GLUCOSE-CAPILLARY: 278 mg/dL — AB (ref 70–99)
Glucose-Capillary: 154 mg/dL — ABNORMAL HIGH (ref 70–99)
Glucose-Capillary: 156 mg/dL — ABNORMAL HIGH (ref 70–99)
Glucose-Capillary: 156 mg/dL — ABNORMAL HIGH (ref 70–99)
Glucose-Capillary: 169 mg/dL — ABNORMAL HIGH (ref 70–99)
Glucose-Capillary: 67 mg/dL — ABNORMAL LOW (ref 70–99)
Glucose-Capillary: 68 mg/dL — ABNORMAL LOW (ref 70–99)

## 2018-08-08 LAB — HEMOGLOBIN A1C
Hgb A1c MFr Bld: 14.7 % — ABNORMAL HIGH (ref 4.8–5.6)
Mean Plasma Glucose: 375.19 mg/dL

## 2018-08-08 MED ORDER — INSULIN ASPART 100 UNIT/ML ~~LOC~~ SOLN
0.0000 [IU] | Freq: Three times a day (TID) | SUBCUTANEOUS | Status: DC
  Administered 2018-08-08: 5 [IU] via SUBCUTANEOUS
  Filled 2018-08-08: qty 1

## 2018-08-08 MED ORDER — FAMOTIDINE 20 MG PO TABS
20.0000 mg | ORAL_TABLET | Freq: Two times a day (BID) | ORAL | Status: DC
  Administered 2018-08-08: 20 mg via ORAL
  Filled 2018-08-08: qty 1

## 2018-08-08 MED ORDER — INSULIN DETEMIR 100 UNIT/ML ~~LOC~~ SOLN: 10.0000 [IU] | SUBCUTANEOUS | 1 refills | Status: DC

## 2018-08-08 MED ORDER — INSULIN ASPART 100 UNIT/ML ~~LOC~~ SOLN
3.0000 [IU] | Freq: Three times a day (TID) | SUBCUTANEOUS | Status: DC
  Administered 2018-08-08 (×2): 3 [IU] via SUBCUTANEOUS
  Filled 2018-08-08 (×2): qty 1

## 2018-08-08 MED ORDER — INSULIN ASPART 100 UNIT/ML ~~LOC~~ SOLN: 3.0000 [IU] | Freq: Three times a day (TID) | SUBCUTANEOUS | 1 refills | Status: DC

## 2018-08-08 MED ORDER — INSULIN ASPART 100 UNIT/ML ~~LOC~~ SOLN: 0.0000 [IU] | Freq: Every day | SUBCUTANEOUS | Status: DC

## 2018-08-08 MED ORDER — INSULIN DETEMIR 100 UNIT/ML ~~LOC~~ SOLN
10.0000 [IU] | SUBCUTANEOUS | Status: DC
  Administered 2018-08-08: 10 [IU] via SUBCUTANEOUS
  Filled 2018-08-08: qty 0.1

## 2018-08-08 MED ORDER — INSULIN ASPART 100 UNIT/ML ~~LOC~~ SOLN
0.0000 [IU] | SUBCUTANEOUS | Status: DC
  Administered 2018-08-08: 3 [IU] via SUBCUTANEOUS
  Filled 2018-08-08: qty 1

## 2018-08-08 MED ORDER — POTASSIUM CHLORIDE CRYS ER 20 MEQ PO TBCR
40.0000 meq | EXTENDED_RELEASE_TABLET | Freq: Once | ORAL | Status: AC
  Administered 2018-08-08: 40 meq via ORAL
  Filled 2018-08-08: qty 2

## 2018-08-08 NOTE — Care Management (Signed)
Patient for discharge and verbalizes  understanding to proceed to [facility] to obtain her insulin and syringes. She says she is not sure whether she will complete the application because "My insurance is good Nov 1.  Works for [employer]. Updated primary nurse of need to proceed with discharge so patient can obtain her meds before clinic closes

## 2018-08-08 NOTE — Care Management Note (Signed)
Case Management Note  Patient Details  Name: Emma Thomas MRN: [id number] Date of Birth: Aug 16, 1996  Subjective/Objective:                 Patient admitted for DKA from Home.   She lost her job and lost her insurance.  Says is is now employed but her insurance does not go into effect until Nov 1.  She is [facility] with [facility] but says "It takes too long to get it approved so I did not do it." Confirms she lives in [place] and PCP is Dr Hoy Morn   Action/Plan:  Spoke with Velva Harman at [facility]. Confirmed has Humalog and Levemir vials on hand. Does not carry pens. Will be able to provide needles. Provided with application for [facility]. Updated attending and primary nurse.  on insulin scripts needed for the clinic at discharge  Expected Discharge Date:                  Expected Discharge Plan:     In-House Referral:     Discharge planning Services     Post Acute Care Choice:    Choice offered to:     DME Arranged:    DME Agency:     HH Arranged:    [place] Agency:     Status of Service:     If discussed at [employer], dates discussed:    Additional Comments:  Katrina Stack, RN 08/08/2018, 10:36 AM

## 2018-08-08 NOTE — Progress Notes (Signed)
0730 Blood sugar 67 and rechecked 68. Given crackers and apple juice. Will recheck per protocol.

## 2018-08-08 NOTE — Progress Notes (Addendum)
Discharge teaching done and prescriptions given. Joni Reining with Social Services also in with patient. Return visit at primary care. Patient sucking her thumb and ignoring the nurse. Grandfather enroute to pick up patient. Patient took herself off the cardiac, SPO2 monitor, and blood pressure cuff.

## 2018-08-08 NOTE — Progress Notes (Signed)
Patient was given Levemir at 0300.  The CBGs were then checked at 0325 and 0425 and were still less than 180 with the insulin infusing less than 4 units/hr.  The patient was transitioned off on the insulin drip at 0430, and 3 units were given based on the current sliding scale.  This patient is getting CBGs checked every four hours now.    Cameron Ali, RN

## 2018-08-08 NOTE — Progress Notes (Signed)
[facility] Consultation      Name: Emma Thomas MRN: [id number] DOB: October 25, 1996    ADMISSION DATE:  08/07/2018 CONSULTATION DATE: 08/07/18  REFERRING MD :  Ricka Burdock   CHIEF COMPLAINT:    nausea vomiting with abd pain and ran out of [place]  Emma Thomas is a 61 old woman with known h/o IDDM is admitted thru ER with DKA. She presented to ER today with c/o nausea vomiting x 1 day .She also c/o L flank pain for about 2 weeks. She was treated for UTI 4 weeks ago and her flank pain had resolved but it started 2 weeks ago again.she reports fever of 100.2 yesterday. She reports chronic dry cough without sputum.  She reports that she ran out of levemir 3 days ago as she couldn't support it.she was found to have BG >700 with co2 of 9 and AG od 24,.She was started on IVF, insulin and transferred to ICU.  SUBJECTIVE  No acute issues overnight. Off insulin gtte. Denies chest pain, nausea, vomiting and abdominal pain. N  VITAL SIGNS    Temp:  [97.8 F (36.6 C)-98.8 F (37.1 C)] 98.8 F (37.1 C) (09/23 0200) Pulse Rate:  [90-118] 92 (09/23 0600) Resp:  [12-27] 19 (09/23 0600) BP: (108-144)/(70-95) 114/82 (09/23 0600) SpO2:  [95 %-100 %] 99 % (09/23 0600) Weight:  [57.1 kg-61.2 kg] 57.1 kg (09/22 1358) HEMODYNAMICS:   VENTILATOR SETTINGS:   INTAKE / OUTPUT:  Intake/Output Summary (Last 24 hours) at 08/08/2018 0640 Last data filed at 08/08/2018 0600 Gross per 24 hour  Intake 440.87 ml  Output 770 ml  Net -329.13 ml       PHYSICAL EXAM   Constitutional: She is oriented to person, place, and time, no distress.  HENT: Normocephalic and atraumatic,  EOM are normal,  Pupils are equal, round, and reactive to light, normal range of motion and neck is supple; trachea in midline Cardiovascular: Normal rate and regular rhythm, no murmur, gallop and no friction rub.  Pulmonary/Chest: Effort normal. She has no wheezes. She has no  rales.  Abdominal: Soft, non-distended, epigastric tenderness on palpation. No no guarding, rebound or referred rebound tenderness.  Musculoskeletal: She exhibits no edema or tenderness.  Neurological: She is alert and oriented to person, place, and time.  Skin: Skin is warm and dry. She is not diaphoretic.  Psychiatric:Mood is depressed  LABS   LABS:  CBC Recent Labs  Lab 08/07/18 0952 08/08/18 0109  WBC 14.9* 14.0*  HGB 12.1 11.0*  HCT 37.3 31.2*  PLT 578* 519*   Coag's No results for input(s): APTT, INR in the last 168 hours. BMET Recent Labs  Lab 08/07/18 2035 08/08/18 0109 08/08/18 0438  NA 135 135 135  K 4.0 3.9 3.5  CL 109 107 108  CO2 19* 20* 21*  BUN 14 10 9   CREATININE 0.65 0.72 0.70  GLUCOSE 134* 174* 165*   Electrolytes Recent Labs  Lab 08/07/18 1701 08/07/18 2035 08/08/18 0109 08/08/18 0438  CALCIUM 8.5* 8.5* 8.4* 8.1*  MG 2.0  --   --   --   PHOS 2.0*  --   --   --    Sepsis Markers No results for input(s): LATICACIDVEN, PROCALCITON, O2SATVEN in the last 168 hours. ABG No results for input(s): PHART, PCO2ART, PO2ART in the last 168 hours. Liver Enzymes Recent Labs  Lab 08/07/18 0953 08/08/18 0109  AST 13* 10*  ALT 11 8  ALKPHOS 160*  139*  BILITOT 2.0* 0.8  ALBUMIN 3.0* 2.8*   Cardiac Enzymes No results for input(s): TROPONINI, PROBNP in the last 168 hours. Glucose Recent Labs  Lab 08/07/18 2329 08/08/18 0034 08/08/18 0134 08/08/18 0227 08/08/18 0330 08/08/18 0421  GLUCAP 141* 154* 156* 169* 156* 163*     Recent Results (from the past 240 hour(s))  MRSA PCR Screening     Status: None   Collection Time: 08/07/18  2:03 PM  Result Value Ref Range Status   MRSA by PCR NEGATIVE NEGATIVE Final    Comment:        The GeneXpert MRSA Assay (FDA approved for NASAL specimens only), is one component of a comprehensive MRSA colonization surveillance program. It is not intended to diagnose MRSA infection nor to guide or monitor  treatment for MRSA infections. Performed at [facility], [address], [place], [place] [address]      Current Facility-Administered Medications:  .  0.9 %  sodium chloride infusion, , Intravenous, Continuous, Rosine Door, MD, Stopped at 08/07/18 1729 .  acetaminophen (TYLENOL) tablet 1,000 mg, 1,000 mg, Oral, Q8H PRN, 1,000 mg at 08/08/18 0130 **OR** acetaminophen (TYLENOL) suppository 650 mg, 650 mg, Rectal, Q6H PRN, Rosine Door, MD .  dextrose 5 %-0.45 % sodium chloride infusion, , Intravenous, Continuous, Rosine Door, MD, Stopped at 08/08/18 0435 .  enoxaparin (LOVENOX) injection 40 mg, 40 mg, Subcutaneous, Q24H, Gouru, Aruna, MD .  famotidine (PEPCID) IVPB 20 mg premix, 20 mg, Intravenous, Q12H, Rosine Door, MD, Stopped at 08/07/18 2221 .  HYDROcodone-acetaminophen (NORCO/VICODIN) 5-325 MG per tablet 1 tablet, 1 tablet, Oral, Q6H PRN, Gouru, Aruna, MD, 1 tablet at 08/08/18 0225 .  insulin aspart (novoLOG) injection 0-15 Units, 0-15 Units, Subcutaneous, Q4H, Tukov-Yual, Magdalene S, NP, 3 Units at 08/08/18 0427 .  insulin aspart (novoLOG) injection 3 Units, 3 Units, Subcutaneous, TID WC, Tukov-Yual, Magdalene S, NP .  insulin detemir (LEVEMIR) injection 10 Units, 10 Units, Subcutaneous, Q24H, Tukov-Yual, Magdalene S, NP, 10 Units at 08/08/18 0302 .  insulin regular (NOVOLIN R,HUMULIN R) 100 Units in sodium chloride 0.9 % 100 mL (1 Units/mL) infusion, , Intravenous, Continuous, Rosine Door, MD, Stopped at 08/08/18 0600 .  ondansetron (ZOFRAN) tablet 4 mg, 4 mg, Oral, Q6H PRN **OR** ondansetron (ZOFRAN) injection 4 mg, 4 mg, Intravenous, Q6H PRN, Gouru, Aruna, MD .  promethazine (PHENERGAN) injection 12.5 mg, 12.5 mg, Intravenous, Q6H PRN, Gouru, Aruna, MD, 12.5 mg at 08/07/18 1134 .  SUMAtriptan (IMITREX) tablet 50 mg, 50 mg, Oral, PRN, Gouru, Aruna, MD, 50 mg at 08/08/18 0044  IMAGING    Dg Chest 2 View  Result Date: 08/07/2018 CLINICAL DATA:  Fever, cough  EXAM: CHEST - 2 VIEW COMPARISON:  08/16/2017 FINDINGS: Lungs are clear.  No pleural effusion or pneumothorax. The heart is normal in size. Visualized osseous structures are within normal limits. IMPRESSION: Normal chest radiographs. Electronically Signed   By: Julian Hy M.D.   On: 08/07/2018 11:08    MICRO DATA: MRSA PCR  UA clear Blood Resp   ANTIMICROBIALS:  None   ASSESSMENT/PLAN   1.DKA secondary to noncompliance with medications D/C Insulin drip and start SQ insulin per DKA transition protocol. D/c serial BMPs q 6hrs  BMP AT 1030 and 1700 Continue IV and Po hydration   Diabetic coordinator and case management  Consulted   2.Pseudohyponatremia related  hyperglycemia Cont IVF  3.Hyperkalemia from underlying DKA Should be auto corrected with IV fluids and insulin Continue close monitoring  4.AKI from dehydration  IV fluids and monitor renal function closely and avoid nephrotoxins  5.Back pain with nausea and vomiting. No evidence of UTI Symptomatic treatment with tyelonol Urinalysis is normal and urine is negative for pregnancy test  6. Elevated LFT,s , follow for now  Full code, IV h2 blockers Lovenox for DVT-P    Deforest Hoyles, NP  08/08/2018 6:40 AM [facility]

## 2018-08-08 NOTE — Progress Notes (Addendum)
Inpatient Diabetes Program Recommendations  AACE/ADA: New Consensus Statement on Inpatient Glycemic Control (2019)  Target Ranges:  Prepandial:   less than 140 mg/dL      Peak postprandial:   less than 180 mg/dL (1-2 hours)      Critically ill patients:  140 - 180 mg/dL  Results for Emma Thomas, Emma Thomas (MRN [id number]) as of 08/08/2018 08:16  Ref. Range 08/08/2018 00:34 08/08/2018 01:34 08/08/2018 02:27 08/08/2018 03:30 08/08/2018 04:21 08/08/2018 07:21 08/08/2018 07:23 08/08/2018 07:54  Glucose-Capillary Latest Ref Range: 70 - 99 mg/dL 154 (H) 156 (H) 169 (H) 156 (H)  Levemir 10 units 163 (H)  Novolog 3 units  68 (L) 67 (L) 89  Results for Emma Thomas, Emma Thomas (MRN [id number]) as of 08/08/2018 08:16  Ref. Range 08/07/2018 09:53  Glucose Latest Ref Range: 70 - 99 mg/dL [facility])   Results for Emma Thomas, Emma Thomas (MRN [id number]) as of 08/08/2018 08:16  Ref. Range 07/31/2017 23:02  Hemoglobin A1C Latest Ref Range: 4.8 - 5.6 % 12.2 (H)   Review of Glycemic Control  Diabetes history: DM1 (makes NO insulin; requires basal, correction, and meal coverage insulin) Outpatient Diabetes medications: Levemir 16 units QHS, Humalog 4 units TID with meals, Novolog 0-9 units TID with meals, Novolog 0-5 units QHS Current orders for Inpatient glycemic control: Levemir 10 units Q24H, Novolog 0-15 units Q4H, Novolog 3 units TID with meals for meal coverage  Inpatient Diabetes Program Recommendations:  Correction (SSI): Noted glucose down to 68 mg/dl this morning. Hypoglycemia likely from too much Novolog correction scale.  Please consider decreasing Novolog correction to sensitive scale (0-9 units). HgbA1C: A1C in process.  NOTE: In reviewing chart, noted patient has Type 1 DM and PCP is listed as [facility]. Last office visit at [facility] was 11/03/2017. Patient had an appointment for 05/30/18 but per chart patient was a NO show for appointment.  Per H&P, patient ran out of Levemir 3 days ago and noted NO  insurance listed in chart. CM consult already ordered. Will follow up with patient today.  Addendum 9/23*19@13 :10-Spoke with patient regarding DM control and insulin regimen. Patient lying in bed with covers up over most of her face with eyes closed. Patient awakened easily and engaged in conversation by providing only short answers to questions asked.  Patient states that she takes Levemir 16 units QHS, Humalog 4-6 units TID with meals for meal coverage, and Novolog per correction scale BID. Inquired about why she is using Novolog and Humalog and patient stated "that is what they give me; I use one for meal coverage and the other for my sugar if it is high."  Patient confirms that she ran out of Levemir insulin and notes that she lost her insurance she had and now has a new job and will get insurance effective November 1st.  Patient reports that she will be going to Medication Management clinic [facility]) to get discharge medications filled. Patient notes that she has been to [facility] before and she states that they close at 4:30 today so she needs to get discharged in time to get the medications filled. Explained that [facility] has Levemir and Humalog insulin vials and that is what she will be discharged on. Patient reports that she has everything she needs for glucose monitoring at home. Discussed Reli-On products at [facility] which include NOVOLIN N, NOVOLIN R, and NOVOLIN 70/30 insulin vials for $25 per vial.  Encouraged patient to talk with PCP about those insulins and how they would be  prescribed compared to her current insulins in case she would ever need to use them if she was unable to get Levemir and Humalog insulin refilled. Stressed importance of improving DM control and decrease risk of complications of uncontrolled DM, especially with her being so young. Patient verbalized understanding of information discussed and notes that she has no questions or concerns at this time. Myra, RN is off unit at the time; spoke with  Judson Roch, RN and asked that she make sure Myra knows that patient has discharge orders in, CM and Diabetes Coordinator have spoken with patient, and [facility] closes today at 4:30 so patient will need to be discharged in time to get prescriptions filled at [facility].  Thanks, Barnie Alderman, RN, MSN, CDE Diabetes Coordinator Inpatient Diabetes Program [phone] (Team Pager from 8am to 5pm)

## 2018-08-08 NOTE — Progress Notes (Signed)
PHARMACIST - PHYSICIAN COMMUNICATION  CONCERNING: IV to Oral Route Change Policy  RECOMMENDATION: This patient is receiving famotidine by the intravenous route.  Based on criteria approved by the Pharmacy and Therapeutics Committee, the intravenous medication(s) is/are being converted to the equivalent oral dose form(s).   DESCRIPTION: These criteria include:  The patient is eating (either orally or via tube) and/or has been taking other orally administered medications for a least 24 hours  The patient has no evidence of active gastrointestinal bleeding or impaired GI absorption (gastrectomy, short bowel, patient on TNA or NPO).  If you have questions about this conversion, please contact the Pharmacy Department  []   [phone] )  Emma Thomas [x]   [phone] )  [facility] []   [phone] )  Emma Thomas []   [phone] )  [facility] []   [phone] )  Emma Thomas, [facility] 08/08/2018 8:50 AM

## 2018-08-08 NOTE — Progress Notes (Signed)
Discharged home with grandfather and girlfriend.

## 2018-08-08 NOTE — Progress Notes (Signed)
Recent labs for [name] at 0100 showed a CO2 of 20 and anion gap of 8.  The last six CBGs were all under 180 with the last one reading 156.  The NP was notified, and plans were made transition the patient with the next blood draw.  Cameron Ali, RN

## 2018-08-09 LAB — HIV ANTIBODY (ROUTINE TESTING W REFLEX): HIV SCREEN 4TH GENERATION: NONREACTIVE

## 2018-08-10 NOTE — Discharge Summary (Signed)
[place] [facility] [place] NAME: Emma Thomas    MR#:  [id number]  DATE OF BIRTH:  03-31-1996  DATE OF ADMISSION:  08/07/2018 ADMITTING PHYSICIAN: Nicholes Mango, MD  DATE OF DISCHARGE: 08/08/2018  2:57 PM  PRIMARY CARE PHYSICIAN: [place]    ADMISSION DIAGNOSIS:  High anion gap metabolic acidosis [T55.7] Diabetic ketoacidosis without coma associated with type 1 diabetes mellitus ([place]) [E10.10]  DISCHARGE DIAGNOSIS:  Active Problems:   DKA (diabetic ketoacidoses) (HCC)   High anion gap metabolic acidosis   AKI (acute kidney injury) ([place])   Liver function abnormality   Abdominal pain   SECONDARY DIAGNOSIS:   Past Medical History:  Diagnosis Date  . Diabetes mellitus without complication ([place])    Type 1 DM    HOSPITAL COURSE:   Came with DKA as she ran out of her insuline, treated in ICU with insuline drip. Once resolved, tolerated diet and started on basal insuline. Discharged with assistance with her insuline and syringes.  DISCHARGE CONDITIONS:   Stable.  CONSULTS OBTAINED:  Treatment Team:  Rosine Door, MD Lahoma Rocker, MD  DRUG ALLERGIES:  No Known Allergies  DISCHARGE MEDICATIONS:   Allergies as of 08/08/2018   No Known Allergies     Medication List    STOP taking these medications   amoxicillin-clavulanate 875-125 MG tablet Commonly known as:  AUGMENTIN   HUMALOG KWIKPEN 100 UNIT/ML KiwkPen Generic drug:  insulin lispro   LEVEMIR FLEXTOUCH 100 UNIT/ML Pen Generic drug:  Insulin Detemir Replaced by:  insulin detemir 100 UNIT/ML injection     TAKE these medications   HYDROcodone-acetaminophen 5-325 MG tablet Commonly known as:  NORCO/VICODIN Take 1 tablet by mouth every 6 (six) hours as needed.   ibuprofen 600 MG tablet Commonly known as:  ADVIL,MOTRIN Take 1 tablet (600 mg total) by mouth every 6 (six) hours as needed.   insulin aspart 100 UNIT/ML injection Commonly known  as:  novoLOG Inject 3 Units into the skin 3 (three) times daily with meals. What changed:    how much to take  Another medication with the same name was removed. Continue taking this medication, and follow the directions you see here.   insulin detemir 100 UNIT/ML injection Commonly known as:  LEVEMIR Inject 0.1 mLs (10 Units total) into the skin daily. Replaces:  LEVEMIR FLEXTOUCH 100 UNIT/ML Pen   ondansetron 8 MG disintegrating tablet Commonly known as:  ZOFRAN-ODT Take 1 tablet (8 mg total) by mouth every 8 (eight) hours as needed for nausea or vomiting.   SKYLA IU 1 Device by Intrauterine route.   SUMAtriptan 50 MG tablet Commonly known as:  IMITREX Take 1 tablet by mouth as needed.        DISCHARGE INSTRUCTIONS:    Follow with PMD in 1-2 weeks.  If you experience worsening of your admission symptoms, develop shortness of breath, life threatening emergency, suicidal or homicidal thoughts you must seek medical attention immediately by calling 911 or calling your MD immediately  if symptoms less severe.  You Must read complete instructions/literature along with all the possible adverse reactions/side effects for all the Medicines you take and that have been prescribed to you. Take any new Medicines after you have completely understood and accept all the possible adverse reactions/side effects.   Please note  You were cared for by a hospitalist during your hospital stay. If you have any questions about your discharge medications or the care you received while  you were in the hospital after you are discharged, you can call the unit and asked to speak with the hospitalist on call if the hospitalist that took care of you is not available. Once you are discharged, your primary care physician will handle any further medical issues. Please note that NO REFILLS for any discharge medications will be authorized once you are discharged, as it is imperative that you return to your  primary care physician (or establish a relationship with a primary care physician if you do not have one) for your aftercare needs so that they can reassess your need for medications and monitor your lab values.    Today   CHIEF COMPLAINT:   Chief Complaint  Patient presents with  . Emesis    HISTORY OF PRESENT ILLNESS:  Emma Thomas  is a 22 y.o. female with a known history of insulin requiring diabetes mellitus is presenting to the ED with a chief complaint of nausea vomiting and abdominal pain.  Patient also stated that she ran out of her Levemir for the past 3 days and could not afford to buy it.  Sodium at 125 anion gap 24 bicarb at 9 with blood sugar at 760.  Patient is started on aggressive hydration with IV fluid boluses and insulin drip and hospitalist team is called to admit the patient.  Patient reports a nausea and vomiting improved after antinausea medications were given in the ED.  Lives with her boyfriend.   VITAL SIGNS:  Blood pressure 128/90, pulse (!) 106, temperature 97.7 F (36.5 C), temperature source Oral, resp. rate 18, height 5\' 6"  (1.676 m), weight 57.1 kg, SpO2 100 %.  I/O:  No intake or output data in the 24 hours ending 08/10/18 0744  PHYSICAL EXAMINATION:  GENERAL:  22 y.o.-year-old patient lying in the bed with no acute distress.  EYES: Pupils equal, round, reactive to light and accommodation. No scleral icterus. Extraocular muscles intact.  HEENT: Head atraumatic, normocephalic. Oropharynx and nasopharynx clear.  NECK:  Supple, no jugular venous distention. No thyroid enlargement, no tenderness.  LUNGS: Normal breath sounds bilaterally, no wheezing, rales,rhonchi or crepitation. No use of accessory muscles of respiration.  CARDIOVASCULAR: S1, S2 normal. No murmurs, rubs, or gallops.  ABDOMEN: Soft, non-tender, non-distended. Bowel sounds present. No organomegaly or mass.  EXTREMITIES: No pedal edema, cyanosis, or clubbing.  NEUROLOGIC: Cranial nerves  II through XII are intact. Muscle strength 5/5 in all extremities. Sensation intact. Gait not checked.  PSYCHIATRIC: The patient is alert and oriented x 3.  SKIN: No obvious rash, lesion, or ulcer.   DATA REVIEW:   CBC Recent Labs  Lab 08/08/18 0109  WBC 14.0*  HGB 11.0*  HCT 31.2*  PLT 519*    Chemistries  Recent Labs  Lab 08/07/18 1701  08/08/18 0109 08/08/18 0438  NA 134*   < > 135 135  K 4.2   < > 3.9 3.5  CL 107   < > 107 108  CO2 18*   < > 20* 21*  GLUCOSE 247*   < > 174* 165*  BUN 15   < > 10 9  CREATININE 0.76   < > 0.72 0.70  CALCIUM 8.5*   < > 8.4* 8.1*  MG 2.0  --   --   --   AST  --   --  10*  --   ALT  --   --  8  --   ALKPHOS  --   --  139*  --  BILITOT  --   --  0.8  --    < > = values in this interval not displayed.    Cardiac Enzymes No results for input(s): TROPONINI in the last 168 hours.  Microbiology Results  Results for orders placed or performed during the hospital encounter of 08/07/18  MRSA PCR Screening     Status: None   Collection Time: 08/07/18  2:03 PM  Result Value Ref Range Status   MRSA by PCR NEGATIVE NEGATIVE Final    Comment:        The GeneXpert MRSA Assay (FDA approved for NASAL specimens only), is one component of a comprehensive MRSA colonization surveillance program. It is not intended to diagnose MRSA infection nor to guide or monitor treatment for MRSA infections. Performed at [facility], [address], [place], [place] [address]     RADIOLOGY:  No results found.  EKG:   Orders placed or performed during the hospital encounter of 08/07/18  . ED EKG  . ED EKG  . EKG 12-Lead  . EKG 12-Lead      Management plans discussed with the patient, family and they are in agreement.  CODE STATUS:  Code Status History    Date Active Date Inactive Code Status Order ID Comments User Context   08/07/2018 1358 08/08/2018 1806 Full Code [id number]  Nicholes Mango, MD Inpatient   08/16/2017 2144 08/18/2017  2223 Full Code [id number]  Demetrios Loll, MD Inpatient   07/31/2017 2234 08/02/2017 1538 Full Code [id number]  Harvie Bridge, DO Inpatient   07/31/2016 0123 08/02/2016 1642 Full Code [id number]  Harvie Bridge, DO Inpatient   06/08/2016 2136 06/11/2016 1208 Full Code [id number]  Gladstone Lighter, MD Inpatient   02/03/2016 1431 02/04/2016 1616 Full Code [id number]  Hillary Bow, MD ED   09/09/2015 1102 09/10/2015 1532 Full Code [id number]  Henreitta Leber, MD Inpatient      TOTAL TIME TAKING CARE OF THIS PATIENT: 35 minutes.    Vaughan Basta M.D on 08/10/2018 at 7:44 AM  Between 7am to 6pm - Pager - [phone]  After 6pm go to [email or web address] - password [email or web address] [facility]  Office  [phone]  CC: Primary care physician; [facility]   Note: This dictation was prepared with Dragon dictation along with smaller phrase technology. Any transcriptional errors that result from this process are unintentional.

## 2018-08-15 DIAGNOSIS — R945 Abnormal results of liver function studies: Secondary | ICD-10-CM

## 2018-08-15 DIAGNOSIS — E1121 Type 2 diabetes mellitus with diabetic nephropathy: Secondary | ICD-10-CM | POA: Insufficient documentation

## 2018-09-10 ENCOUNTER — Other Ambulatory Visit: Payer: Self-pay

## 2018-09-10 ENCOUNTER — Emergency Department
Admission: EM | Admit: 2018-09-10 | Discharge: 2018-09-10 | Disposition: A | Discharge status: Home / Self Care | Payer: Self-pay | Attending: Emergency Medicine | Admitting: Emergency Medicine

## 2018-09-10 ENCOUNTER — Encounter: Payer: Self-pay | Admitting: Emergency Medicine

## 2018-09-10 DIAGNOSIS — E10628 Type 1 diabetes mellitus with other skin complications: Secondary | ICD-10-CM

## 2018-09-10 DIAGNOSIS — Z79899 Other long term (current) drug therapy: Secondary | ICD-10-CM | POA: Insufficient documentation

## 2018-09-10 DIAGNOSIS — K610 Anal abscess: Secondary | ICD-10-CM | POA: Insufficient documentation

## 2018-09-10 DIAGNOSIS — E109 Type 1 diabetes mellitus without complications: Secondary | ICD-10-CM | POA: Insufficient documentation

## 2018-09-10 DIAGNOSIS — Z9114 Patient's other noncompliance with medication regimen: Secondary | ICD-10-CM

## 2018-09-10 MED ORDER — LIDOCAINE-EPINEPHRINE 2 %-1:100000 IJ SOLN
20.0000 mL | Freq: Once | INTRAMUSCULAR | Status: DC
  Filled 2018-09-10: qty 1

## 2018-09-10 MED ORDER — HYDROMORPHONE HCL 1 MG/ML IJ SOLN
2.0000 mg | Freq: Once | INTRAMUSCULAR | Status: AC
  Administered 2018-09-10: 2 mg via INTRAMUSCULAR
  Filled 2018-09-10: qty 2

## 2018-09-10 MED ORDER — AMOXICILLIN-POT CLAVULANATE 875-125 MG PO TABS: 1.0000 | ORAL_TABLET | Freq: Two times a day (BID) | ORAL | 0 refills | Status: AC

## 2018-09-10 MED ORDER — HYDROCODONE-ACETAMINOPHEN 5-325 MG PO TABS: 1.0000 | ORAL_TABLET | Freq: Four times a day (QID) | ORAL | 0 refills | Status: DC | PRN

## 2018-09-10 NOTE — Discharge Instructions (Addendum)
Please take all of your antibiotics as prescribed and make sure you use sitz baths twice a day until you can follow-up with the general surgeon.  Return to the emergency department sooner for any concerns.  It was a pleasure to take care of you today, and thank you for coming to our emergency department.  If you have any questions or concerns before leaving please ask the nurse to grab me and I'm more than happy to go through your aftercare instructions again.  If you were prescribed any opioid pain medication today such as Norco, Vicodin, Percocet, morphine, hydrocodone, or oxycodone please make sure you do not drive when you are taking this medication as it can alter your ability to drive safely.  If you have any concerns once you are home that you are not improving or are in fact getting worse before you can make it to your follow-up appointment, please do not hesitate to call 911 and come back for further evaluation.  Darel Hong, MD

## 2018-09-10 NOTE — ED Notes (Signed)
This RN discussed that patient would not be able to drive home immediatly, and for several hours post hydromorphone administration. Patient drove herself and does not currently have another method of transport home. Patient currently on her phone, attempting to arrange for transportation home.

## 2018-09-10 NOTE — ED Provider Notes (Signed)
[facility] Emergency Department Provider Note  ____________________________________________   First MD Initiated Contact with Patient 09/10/18 0542     (approximate)  I have reviewed the triage vital signs and the nursing notes.   HISTORY  Chief Complaint Abscess   HPI Emma Thomas is a 22 y.o. female who self presents to the emergency department with 3 days of gradual onset slowly progressive now moderate to severe painful swelling to her inner right buttock.  She has a past medical history of diabetes mellitus type 1 that is poorly controlled.  Her last A1c was somewhere around 12.  Her symptoms began as a small "bump" and have begun to spread.  No fevers or chills.  No pain with defecation.  Pain is worse when sitting down or movement.  She has had several abscesses in the past and she feels like this needs to be "cut open".    Past Medical History:  Diagnosis Date  . Diabetes mellitus without complication ([place])    Type 1 DM    Patient Active Problem List   Diagnosis Date Noted  . High anion gap metabolic acidosis   . AKI (acute kidney injury) ([place])   . Liver function abnormality   . Abdominal pain   . Pyelonephritis 07/30/2016  . Sepsis ([place]) 06/08/2016  . DKA (diabetic ketoacidoses) ([place]) 09/09/2015    Past Surgical History:  Procedure Laterality Date  . abscess removal      Prior to Admission medications   Medication Sig Start Date End Date Taking? Authorizing Provider  amoxicillin-clavulanate (AUGMENTIN) 875-125 MG tablet Take 1 tablet by mouth 2 (two) times daily for 10 days. 09/10/18 09/20/18  Darel Hong, MD  HYDROcodone-acetaminophen (NORCO) 5-325 MG tablet Take 1 tablet by mouth every 6 (six) hours as needed for up to 7 doses for severe pain. 09/10/18   Darel Hong, MD  ibuprofen (ADVIL,MOTRIN) 600 MG tablet Take 1 tablet (600 mg total) by mouth every 6 (six) hours as needed. 07/02/18   Arta Silence, MD  insulin  aspart (NOVOLOG) 100 UNIT/ML injection Inject 3 Units into the skin 3 (three) times daily with meals. 08/08/18   Vaughan Basta, MD  insulin detemir (LEVEMIR) 100 UNIT/ML injection Inject 0.1 mLs (10 Units total) into the skin daily. 08/09/18   Vaughan Basta, MD  Levonorgestrel (SKYLA IU) 1 Device by Intrauterine route.    [provider]  ondansetron (ZOFRAN ODT) 8 MG disintegrating tablet Take 1 tablet (8 mg total) by mouth every 8 (eight) hours as needed for nausea or vomiting. 07/02/18   Arta Silence, MD  SUMAtriptan (IMITREX) 50 MG tablet Take 1 tablet by mouth as needed. 06/11/17   [provider]    Allergies [name].  Family History  Problem Relation Age of Onset  . Breast cancer Mother   . Diabetes type II Brother     Social History Social History   Tobacco Use  . Smoking status: Never Smoker  . Smokeless tobacco: Never Used  Substance Use Topics  . Alcohol use: Not Currently    Alcohol/week: 2.0 standard drinks    Types: 2 Shots of liquor per week  . Drug use: No    Review of Systems Constitutional: No fever/chills Cardiovascular: Denies chest pain. Respiratory: Denies shortness of breath. Gastrointestinal: No abdominal pain.  No nausea, no vomiting.  No diarrhea.  No constipation. Musculoskeletal: Positive for buttock pain Neurological: Negative for headaches   ____________________________________________   PHYSICAL EXAM:  VITAL SIGNS: ED Triage Vitals  Enc Vitals Group     BP 09/10/18 0238 128/89     Pulse Rate 09/10/18 0238 (!) 109     Resp 09/10/18 0238 20     Temp 09/10/18 0238 98.6 F (37 C)     Temp Source 09/10/18 0238 Oral     SpO2 09/10/18 0238 99 %     Weight 09/10/18 0240 130 lb (59 kg)     Height 09/10/18 0240 5\' 6"  (1.676 m)     Head Circumference --      Peak Flow --      Pain Score 09/10/18 0240 8     Pain Loc --      Pain Edu? --      Excl. in [place]? --      Constitutional: Alert and oriented x4 appears obviously uncomfortable pacing around the room holding her right buttock Cardiovascular: Tachycardic regular rhythm Respiratory: Normal respiratory effort.  No retractions. Gastrointestinal: Soft nontender Roughly 4 cm perianal abscess on the right with slight surrounding cellulitis Neurologic:  Normal speech and language. No gross focal neurologic deficits are appreciated.  Skin:  Skin is warm, dry and intact. No rash noted.    ____________________________________________  LABS (all labs ordered are listed, but only abnormal results are displayed)  Labs Reviewed - No data to display   __________________________________________  EKG   ____________________________________________  RADIOLOGY   ____________________________________________   DIFFERENTIAL includes but not limited to  Perianal abscess, perirectal abscess, gluteal abscess, cellulitis   PROCEDURES  Procedure(s) performed: Yes  .Marland KitchenIncision and Drainage Date/Time: 09/10/2018 6:34 AM Performed by: Darel Hong, MD Authorized by: Darel Hong, MD   Consent:    Consent obtained:  Verbal   Consent given by:  Patient   Risks discussed:  Bleeding, infection, incomplete drainage and pain   Alternatives discussed:  Alternative treatment, delayed treatment and observation Location:    Type:  Abscess   Size:  4   Location:  Anogenital   Anogenital location:  Perianal Pre-procedure details:    Skin preparation:  Chloraprep Sedation:    Sedation type:  Anxiolysis Anesthesia (see MAR for exact dosages):    Anesthesia method:  Local infiltration   Local anesthetic:  Lidocaine 2% WITH epi Procedure type:    Complexity:  Complex Procedure details:    Incision types:  Single straight   Scalpel blade:  11   Wound management:  Probed and deloculated, irrigated with saline, extensive cleaning and debrided   Drainage:  Purulent   Drainage amount:   Copious   Wound treatment:  Wound left open   Packing materials:  None Post-procedure details:    Patient tolerance of procedure:  Tolerated well, no immediate complications    Critical Care performed: no  ____________________________________________   INITIAL IMPRESSION / ASSESSMENT AND PLAN / ED COURSE  Pertinent labs & imaging results that were available during my care of the patient were reviewed by me and considered in my medical decision making (see chart for details).   As part of my medical decision making, I reviewed the following data within the [place] History obtained from family if available, nursing notes, old chart and ekg, as well as notes from prior ED visits.  The patient initially did not have a ride here but once her boyfriend arrived we gave her 2 mg of intramuscular Dilaudid to help with the pain.  I then cleansed her abscess with chlorhexidine and performed a block with 2%  lidocaine with epinephrine.  I then made a linear incision radial from the anus over the point of maximal fluctuance is close to the anal verge as I could and immediately expressed a large amount of foul-smelling purulent material.  All loculations broken up and the wound was debrided.  I allowed to to drain washed it out.  Then left open.  Dry dressing on top.  Sitz baths were discussed and I will cover her with Augmentin as well as hydrocodone and refer her to general surgery.  She tolerated well.  Pain significantly improved following the procedure.      ____________________________________________   FINAL CLINICAL IMPRESSION(S) / ED DIAGNOSES  Final diagnoses:  Type 1 diabetes mellitus with other skin complication (HCC)  H/O medication noncompliance  Perianal abscess      NEW MEDICATIONS STARTED DURING THIS VISIT:  Discharge Medication List as of 09/10/2018  7:03 AM    START taking these medications   Details  amoxicillin-clavulanate (AUGMENTIN) 875-125 MG  tablet Take 1 tablet by mouth 2 (two) times daily for 10 days., Starting Sat 09/10/2018, Until Tue 09/20/2018, Print         Note:  This document was prepared using Dragon voice recognition software and may include unintentional dictation errors.      Darel Hong, MD 09/12/18 2236

## 2018-09-10 NOTE — ED Notes (Signed)
Patient's ride present at bedside.

## 2018-09-10 NOTE — ED Triage Notes (Signed)
Pt reports has been dealing with an abscess for past 3 days located in her right gluteus area. Pt denies any other symptom pt talks in complete sentences no distress noted

## 2018-09-10 NOTE — ED Notes (Signed)
Pt reports she has type 1 DM

## 2018-11-02 ENCOUNTER — Emergency Department
Admission: EM | Admit: 2018-11-02 | Discharge: 2018-11-02 | Disposition: A | Discharge status: Home / Self Care | Payer: Managed Care, Other (non HMO) | Attending: Emergency Medicine | Admitting: Emergency Medicine

## 2018-11-02 ENCOUNTER — Encounter: Payer: Self-pay | Admitting: Emergency Medicine

## 2018-11-02 DIAGNOSIS — Z794 Long term (current) use of insulin: Secondary | ICD-10-CM | POA: Diagnosis not present

## 2018-11-02 DIAGNOSIS — H60312 Diffuse otitis externa, left ear: Secondary | ICD-10-CM

## 2018-11-02 DIAGNOSIS — H9202 Otalgia, left ear: Secondary | ICD-10-CM | POA: Diagnosis present

## 2018-11-02 DIAGNOSIS — E109 Type 1 diabetes mellitus without complications: Secondary | ICD-10-CM | POA: Insufficient documentation

## 2018-11-02 MED ORDER — NEOMYCIN-POLYMYXIN-HC 3.5-10000-1 OT SOLN: 3.0000 [drp] | Freq: Three times a day (TID) | OTIC | 0 refills | Status: AC

## 2018-11-02 MED ORDER — HYDROCODONE-ACETAMINOPHEN 5-325 MG PO TABS: 1.0000 | ORAL_TABLET | Freq: Four times a day (QID) | ORAL | 0 refills | Status: DC | PRN

## 2018-11-02 MED ORDER — SULFAMETHOXAZOLE-TRIMETHOPRIM 800-160 MG PO TABS: 1.0000 | ORAL_TABLET | Freq: Two times a day (BID) | ORAL | 0 refills | Status: DC

## 2018-11-02 NOTE — ED Triage Notes (Signed)
Pt stating left ear pain that started 3 weeks ago. Pt stating her PCP gave her antibiotics which she has not completed.. Pt stating she feels like the pain is getting "worser." Pt denies drainage. Pt stating a fever "a few days ago." Afebrile.

## 2018-11-02 NOTE — Discharge Instructions (Addendum)
Follow up with your regular doctor if not better in 3 days, return if worsening

## 2018-11-02 NOTE — ED Notes (Signed)

## 2018-11-02 NOTE — ED Provider Notes (Signed)
[facility] Emergency Department Provider Note  ____________________________________________   First MD Initiated Contact with Patient 11/02/18 1853     (approximate)  I have reviewed the triage vital signs and the nursing notes.   HISTORY  Chief Complaint Otalgia    HPI Emma Thomas is a 22 y.o. female presents emergency department complaining about left ear pain.  She states that she was seen at her primary care doctors at the end of November and was told to take Augmentin and Cipro eardrops.  She states she could not afford the Cipro eardrops because they were $60 so got an over-the-counter eardrop.  She states she did take a while for her to get her medications but she states that she is steadily getting worse.  She has a history of type 1 diabetes.  She denies any fever or chills.    Past Medical History:  Diagnosis Date  . Diabetes mellitus without complication ([place])    Type 1 DM    Patient Active Problem List   Diagnosis Date Noted  . High anion gap metabolic acidosis   . AKI (acute kidney injury) ([place])   . Liver function abnormality   . Abdominal pain   . Pyelonephritis 07/30/2016  . Sepsis ([place]) 06/08/2016  . DKA (diabetic ketoacidoses) ([place]) 09/09/2015    Past Surgical History:  Procedure Laterality Date  . abscess removal      Prior to Admission medications   Medication Sig Start Date End Date Taking? Authorizing Provider  HYDROcodone-acetaminophen (NORCO) 5-325 MG tablet Take 1 tablet by mouth every 6 (six) hours as needed for up to 7 doses for severe pain. 11/02/18   Bonna Steury, Linden Dolin, PA-C  ibuprofen (ADVIL,MOTRIN) 600 MG tablet Take 1 tablet (600 mg total) by mouth every 6 (six) hours as needed. 07/02/18   Arta Silence, MD  insulin aspart (NOVOLOG) 100 UNIT/ML injection Inject 3 Units into the skin 3 (three) times daily with meals. 08/08/18   Vaughan Basta, MD  insulin detemir (LEVEMIR) 100 UNIT/ML injection  Inject 0.1 mLs (10 Units total) into the skin daily. 08/09/18   Vaughan Basta, MD  Levonorgestrel (SKYLA IU) 1 Device by Intrauterine route.    [provider]  neomycin-polymyxin-hydrocortisone (CORTISPORIN) OTIC solution Place 3 drops into the left ear 3 (three) times daily for 10 days. 11/02/18 11/12/18  Karleen Seebeck, Linden Dolin, PA-C  sulfamethoxazole-trimethoprim (BACTRIM DS,SEPTRA DS) 800-160 MG tablet Take 1 tablet by mouth 2 (two) times daily. 11/02/18   Latha Staunton, Linden Dolin, PA-C  SUMAtriptan (IMITREX) 50 MG tablet Take 1 tablet by mouth as needed. 06/11/17   [provider]    Allergies [name].  Family History  Problem Relation Age of Onset  . Breast cancer Mother   . Diabetes type II Brother     Social History Social History   Tobacco Use  . Smoking status: Never Smoker  . Smokeless tobacco: Never Used  Substance Use Topics  . Alcohol use: Not Currently    Alcohol/week: 2.0 standard drinks    Types: 2 Shots of liquor per week  . Drug use: No    Review of Systems  Constitutional: No fever/chills Eyes: No visual changes. ENT: No sore throat.  Positive for left ear pain Respiratory: Denies cough Genitourinary: Negative for dysuria. Musculoskeletal: Negative for back pain. Skin: Negative for rash.    ____________________________________________   PHYSICAL EXAM:  VITAL SIGNS: ED Triage Vitals  Enc Vitals Group     BP  11/02/18 1845 (!) 146/103     Pulse Rate 11/02/18 1845 (!) 106     Resp 11/02/18 1845 18     Temp 11/02/18 1845 98.4 F (36.9 C)     Temp Source 11/02/18 1845 Oral     SpO2 11/02/18 1845 98 %     Weight 11/02/18 1848 140 lb (63.5 kg)     Height 11/02/18 1848 5\' 6"  (1.676 m)     Head Circumference --      Peak Flow --      Pain Score 11/02/18 1847 9     Pain Loc --      Pain Edu? --      Excl. in [place]? --     Constitutional: Alert and oriented. Well appearing and in no acute distress. Eyes:  Conjunctivae are normal.  Head: Atraumatic. Ears: The left ear canal is swollen and tender, pain is reproduced by pulling on the pinna Nose: No congestion/rhinnorhea. Mouth/Throat: Mucous membranes are moist.  Throat is normal Neck:  supple no lymphadenopathy noted Cardiovascular: Normal rate, regular rhythm. Heart sounds are normal Respiratory: Normal respiratory effort.  No retractions, lungs c t a  GU: deferred Musculoskeletal: FROM all extremities, warm and well perfused Neurologic:  Normal speech and language.  Skin:  Skin is warm, dry and intact. No rash noted. Psychiatric: Mood and affect are normal. Speech and behavior are normal.  ____________________________________________   LABS (all labs ordered are listed, but only abnormal results are displayed)  Labs Reviewed - No data to display ____________________________________________   ____________________________________________  RADIOLOGY    ____________________________________________   PROCEDURES  Procedure(s) performed: No  Procedures    ____________________________________________   INITIAL IMPRESSION / ASSESSMENT AND PLAN / ED COURSE  Pertinent labs & imaging results that were available during my care of the patient were reviewed by me and considered in my medical decision making (see chart for details).   Patient is 22 year old female presents emergency department complaining of left ear pain.  She was diagnosed with otitis externa and given Augmentin and Ciprodex, however she waited to get the prescription filled and did not fill the Ciprodex as it was too expensive.  Since then she has been getting worse and the medication is not helping.  Physical exam shows a swollen left ear canal.  The area is very tender.  Discussed the findings with the patient.  Switched her antibiotic to Septra and Cortisporin otic drops.  Explained to her even if they are expensive she needs to get the medications as she has  type 1 diabetes and the other medication is not helping.  She states she understands and will comply.  She was discharged in stable condition.  She is to return if worsening.     As part of my medical decision making, I reviewed the following data within the [place] notes reviewed and incorporated, Old chart reviewed, Notes from prior ED visits and [place] Controlled Substance Database  ____________________________________________   FINAL CLINICAL IMPRESSION(S) / ED DIAGNOSES  Final diagnoses:  Acute diffuse otitis externa of left ear      NEW MEDICATIONS STARTED DURING THIS VISIT:  Discharge Medication List as of 11/02/2018  7:14 PM    START taking these medications   Details  neomycin-polymyxin-hydrocortisone (CORTISPORIN) OTIC solution Place 3 drops into the left ear 3 (three) times daily for 10 days., Starting Wed 11/02/2018, Until Sat 11/12/2018, Normal    sulfamethoxazole-trimethoprim (BACTRIM DS,SEPTRA DS) 800-160 MG tablet Take 1  tablet by mouth 2 (two) times daily., Starting Wed 11/02/2018, Normal         Note:  This document was prepared using Dragon voice recognition software and may include unintentional dictation errors.    Versie Starks, PA-C 11/02/18 2122    Delman Kitten, MD 11/02/18 [phone]

## 2018-11-02 NOTE — ED Notes (Signed)
See triage note  Presents with a 3 week hx of left ear pain   Was seen and placed on Augmentin  States pain started out as intermittent and now is constant  Denies any fever or drainage from ear

## 2019-01-18 ENCOUNTER — Telehealth: Payer: Self-pay | Admitting: Obstetrics and Gynecology

## 2019-01-18 NOTE — Telephone Encounter (Signed)
[facility] Referring for IUD removal, IUD is stuck to [place]. Called and left voicemail for patient to call back to be schedule.

## 2019-02-02 ENCOUNTER — Ambulatory Visit (INDEPENDENT_AMBULATORY_CARE_PROVIDER_SITE_OTHER): Payer: Managed Care, Other (non HMO) | Admitting: Obstetrics and Gynecology

## 2019-02-02 ENCOUNTER — Other Ambulatory Visit: Payer: Self-pay

## 2019-02-02 ENCOUNTER — Encounter: Payer: Self-pay | Admitting: Obstetrics and Gynecology

## 2019-02-02 VITALS — BP 141/97 | HR 106 | Ht 66.0 in | Wt 144.0 lb

## 2019-02-02 DIAGNOSIS — T8339XA Other mechanical complication of intrauterine contraceptive device, initial encounter: Secondary | ICD-10-CM

## 2019-02-02 DIAGNOSIS — Z30432 Encounter for removal of intrauterine contraceptive device: Secondary | ICD-10-CM | POA: Diagnosis not present

## 2019-02-02 NOTE — Progress Notes (Signed)
   IUD Removal  Patient identified, informed consent performed, consent signed.  Patient was in the dorsal lithotomy position, normal external genitalia was noted.  A speculum was placed in the patient's vagina, normal discharge was noted, no lesions. The cervix was visualized, no lesions, no abnormal discharge.  No IUD strings were visualized.  However the distal end of the vertical bar was seen at the external cervical os.  This was grasped with a [place] forceps.  Gentle traction was applied and the IUD did not come out with this amount of traction.  A gentle probe with a cervical dilator was performed after cleaning the cervix with Betadine.  It appears the IUD was lodged in the lower uterine segment or the proximal cervix at about the 2 o'clock position.  Utilizing the dilator for traction multiple attempts were made to remove the IUD.  Eventually, the IUD did break free and was visualized in its entirety, noting that the crossbar was completely intact.  There was some bleeding at the end of the procedure.  The bleeding was treated with silver nitrate and watched for about 10 minutes and hemostasis was assured.  Though this was a difficult procedure, the patient tolerated the procedure well.  Patient will use nothing for contraception for now.  She states that she has been on Nexplanon in the past and will likely resume using that for birth control.  Routine preventative health maintenance measures emphasized.  [name], MD, [facility], [facility] 02/02/2019 4:47 PM    CC: Hortencia Pilar, MD [id number] [place],  [phone]

## 2019-03-06 ENCOUNTER — Emergency Department
Admission: EM | Admit: 2019-03-06 | Discharge: 2019-03-06 | Disposition: A | Discharge status: Home / Self Care | Payer: Managed Care, Other (non HMO) | Attending: Emergency Medicine | Admitting: Emergency Medicine

## 2019-03-06 ENCOUNTER — Encounter: Payer: Self-pay | Admitting: Emergency Medicine

## 2019-03-06 ENCOUNTER — Other Ambulatory Visit: Payer: Self-pay

## 2019-03-06 DIAGNOSIS — E119 Type 2 diabetes mellitus without complications: Secondary | ICD-10-CM | POA: Insufficient documentation

## 2019-03-06 DIAGNOSIS — L02416 Cutaneous abscess of left lower limb: Secondary | ICD-10-CM | POA: Diagnosis not present

## 2019-03-06 DIAGNOSIS — L0291 Cutaneous abscess, unspecified: Secondary | ICD-10-CM

## 2019-03-06 DIAGNOSIS — Z79899 Other long term (current) drug therapy: Secondary | ICD-10-CM | POA: Diagnosis not present

## 2019-03-06 DIAGNOSIS — I10 Essential (primary) hypertension: Secondary | ICD-10-CM | POA: Diagnosis not present

## 2019-03-06 MED ORDER — OXYCODONE-ACETAMINOPHEN 5-325 MG PO TABS
1.0000 | ORAL_TABLET | Freq: Once | ORAL | Status: AC
  Administered 2019-03-06: 1 via ORAL
  Filled 2019-03-06: qty 1

## 2019-03-06 MED ORDER — HYDROCODONE-ACETAMINOPHEN 5-325 MG PO TABS: 1.0000 | ORAL_TABLET | ORAL | 0 refills | Status: DC | PRN

## 2019-03-06 MED ORDER — CLINDAMYCIN PHOSPHATE 300 MG/2ML IJ SOLN
300.0000 mg | Freq: Once | INTRAMUSCULAR | Status: AC
  Administered 2019-03-06: 300 mg via INTRAMUSCULAR
  Filled 2019-03-06: qty 2

## 2019-03-06 MED ORDER — CLINDAMYCIN HCL 300 MG PO CAPS: 300.0000 mg | ORAL_CAPSULE | Freq: Four times a day (QID) | ORAL | 0 refills | Status: DC

## 2019-03-06 MED ORDER — LIDOCAINE HCL (PF) 1 % IJ SOLN
10.0000 mL | Freq: Once | INTRAMUSCULAR | Status: AC
  Administered 2019-03-06: 10 mL
  Filled 2019-03-06: qty 10

## 2019-03-06 NOTE — ED Provider Notes (Signed)
[facility] Emergency Department Provider Note  ____________________________________________  Time seen: Approximately 9:29 PM  I have reviewed the triage vital signs and the nursing notes.   HISTORY  Chief Complaint Abscess    HPI Emma Thomas is a 23 y.o. female who presents emergency department complaining of an abscess to the left medial thigh.  Patient reports that she has had multiple abscesses requiring incision and drainage.  Patient reports that she has been experiencing symptoms for 4 to 7 days.  Patient reports that the area is edematous, tender to touch.  She denies any purulent drainage.  Patient denies using any medication for this complaint prior to arrival.  Because of her repeated abscesses, patient has an appointment already for dermatology.  Patient does have a history of diabetes, hypertension.  No complaints of chronic medical problems.         Past Medical History:  Diagnosis Date  . Diabetes mellitus without complication (HCC)    Type 1 DM  . Essential hypertension   . Neurologic disorder    Both feet    Patient Active Problem List   Diagnosis Date Noted  . High anion gap metabolic acidosis   . AKI (acute kidney injury) ([place])   . Liver function abnormality   . Abdominal pain   . Pyelonephritis 07/30/2016  . Sepsis ([place]) 06/08/2016  . DKA (diabetic ketoacidoses) ([place]) 09/09/2015    Past Surgical History:  Procedure Laterality Date  . abscess removal     excision of bartholin cyst  . Nexplanon  01/2011    Prior to Admission medications   Medication Sig Start Date End Date Taking? Authorizing Provider  clindamycin (CLEOCIN) 300 MG capsule Take 1 capsule (300 mg total) by mouth 4 (four) times daily. 03/06/19   Tirzah Fross, Charline Bills, PA-C  HYDROcodone-acetaminophen (NORCO/VICODIN) 5-325 MG tablet Take 1 tablet by mouth every 4 (four) hours as needed for moderate pain. 03/06/19   Arnol Mcgibbon, Charline Bills, PA-C  insulin aspart  (NOVOLOG) 100 UNIT/ML injection Inject 3 Units into the skin 3 (three) times daily with meals. 08/08/18   Vaughan Basta, MD  insulin detemir (LEVEMIR) 100 UNIT/ML injection Inject 0.1 mLs (10 Units total) into the skin daily. 08/09/18   Vaughan Basta, MD  SUMAtriptan (IMITREX) 50 MG tablet Take 1 tablet by mouth as needed. 06/11/17   [provider]    Allergies [name].  Family History  Problem Relation Age of Onset  . Breast cancer Mother 75  . Lung cancer Maternal Grandmother   . Diabetes type II Paternal Grandmother     Social History Social History   Tobacco Use  . Smoking status: Never Smoker  . Smokeless tobacco: Never Used  Substance Use Topics  . Alcohol use: Not Currently    Alcohol/week: 2.0 standard drinks    Types: 2 Shots of liquor per week  . Drug use: No     Review of Systems  Constitutional: No fever/chills Eyes: No visual changes. No discharge ENT: No upper respiratory complaints. Cardiovascular: no chest pain. Respiratory: no cough. No SOB. Gastrointestinal: No abdominal pain.  No nausea, no vomiting.  No diarrhea.  No constipation. Genitourinary: Negative for dysuria. No hematuria Musculoskeletal: Negative for musculoskeletal pain. Skin: Positive for abscess to left medial thigh. Neurological: Negative for headaches, focal weakness or numbness. 10-point ROS otherwise negative.  ____________________________________________   PHYSICAL EXAM:  VITAL SIGNS: ED Triage Vitals  Enc Vitals Group     BP 03/06/19 2116 Marland Kitchen)  137/91     Pulse Rate 03/06/19 2116 (!) 109     Resp 03/06/19 2116 18     Temp 03/06/19 2116 98.4 F (36.9 C)     Temp Source 03/06/19 2116 Oral     SpO2 03/06/19 2116 100 %     Weight 03/06/19 2115 140 lb (63.5 kg)     Height 03/06/19 2115 5\' 6"  (1.676 m)     Head Circumference --      Peak Flow --      Pain Score 03/06/19 2115 8     Pain Loc --      Pain Edu? --      Excl. in  [place]? --      Constitutional: Alert and oriented. Well appearing and in no acute distress. Eyes: Conjunctivae are normal. PERRL. EOMI. Head: Atraumatic. ENT:      Ears:       Nose: No congestion/rhinnorhea.      Mouth/Throat: Mucous membranes are moist.  Neck: No stridor.    Cardiovascular: Normal rate, regular rhythm. Normal S1 and S2.  Good peripheral circulation. Respiratory: Normal respiratory effort without tachypnea or retractions. Lungs CTAB. Good air entry to the bases with no decreased or absent breath sounds. Musculoskeletal: Full range of motion to all extremities. No gross deformities appreciated. Neurologic:  Normal speech and language. No gross focal neurologic deficits are appreciated.  Skin:  Skin is warm, dry and intact. No rash noted.  Visualization of the left medial thigh reveals erythematous and edematous lesion just distal to the inguinal fold.  Area is very tender to palpation.  Positive for induration and fluctuance.  No purulent drainage identified at this time.  No streaking.  No regional lymphadenopathy. Psychiatric: Mood and affect are normal. Speech and behavior are normal. Patient exhibits appropriate insight and judgement.   ____________________________________________   LABS (all labs ordered are listed, but only abnormal results are displayed)  Labs Reviewed - No data to display ____________________________________________  EKG   ____________________________________________  RADIOLOGY   No results found.  ____________________________________________    PROCEDURES  Procedure(s) performed:    Marland KitchenMarland KitchenIncision and Drainage Date/Time: 03/06/2019 9:36 PM Performed by: Darletta Moll, PA-C Authorized by: Darletta Moll, PA-C   Consent:    Consent obtained:  Verbal   Consent given by:  Patient   Risks discussed:  Bleeding, incomplete drainage and pain Location:    Type:  Abscess   Size:  4 cm   Location:  Lower extremity    Lower extremity location:  Leg   Leg location:  L upper leg Pre-procedure details:    Skin preparation:  Betadine Anesthesia (see MAR for exact dosages):    Anesthesia method:  Local infiltration   Local anesthetic:  Lidocaine 1% w/o epi Procedure type:    Complexity:  Simple Procedure details:    Needle aspiration: no     Incision types:  Single straight   Incision depth:  Subcutaneous   Scalpel blade:  11   Wound management:  Probed and deloculated   Drainage:  Purulent   Drainage amount:  Moderate   Wound treatment:  Wound left open   Packing materials:  None Post-procedure details:    Patient tolerance of procedure:  Tolerated well, no immediate complications      Medications  clindamycin (CLEOCIN) injection 300 mg (has no administration in time range)  lidocaine (PF) (XYLOCAINE) 1 % injection 10 mL (10 mLs Infiltration Given 03/06/19 2154)  oxyCODONE-acetaminophen (PERCOCET/ROXICET) 5-325 MG per  tablet 1 tablet (1 tablet Oral Given 03/06/19 2153)     ____________________________________________   INITIAL IMPRESSION / ASSESSMENT AND PLAN / ED COURSE  Pertinent labs & imaging results that were available during my care of the patient were reviewed by me and considered in my medical decision making (see chart for details).  Review of the [place] CSRS was performed in accordance of the [place] prior to dispensing any controlled drugs.           Patient's diagnosis is consistent with abscess to the left thigh.  Patient has had multiple abscesses requiring incision and drainage.  Patient has appointment with dermatology already established.  Today, patient had a edematous, erythematous, fluctuant lesion to the left medial thigh.  This was incised and drained as described above.  While there was moderate amount of purulent drainage, this was relatively superficial in nature and did not require packing.  Wound care instructions discussed with patient.  Patient will be placed on  clindamycin and first dose given IM here in the emergency department.  Given sensitive region for incision and drainage, patient will be started on pain medication as well.  Follow-up with primary care and dermatology..  Patient is given ED precautions to return to the ED for any worsening or new symptoms.     ____________________________________________  FINAL CLINICAL IMPRESSION(S) / ED DIAGNOSES  Final diagnoses:  Abscess      NEW MEDICATIONS STARTED DURING THIS VISIT:  ED Discharge Orders         Ordered    clindamycin (CLEOCIN) 300 MG capsule  4 times daily     03/06/19 2207    HYDROcodone-acetaminophen (NORCO/VICODIN) 5-325 MG tablet  Every 4 hours PRN     03/06/19 2207              This chart was dictated using voice recognition software/Dragon. Despite best efforts to proofread, errors can occur which can change the meaning. Any change was purely unintentional.    Darletta Moll, PA-C 03/06/19 2207    Harvest Dark, MD 03/06/19 2250

## 2019-03-06 NOTE — ED Triage Notes (Signed)
Patient ambulatory to triage with steady gait, without difficulty or distress noted; pt reports abscess to left inner thigh x 3 days with hx of same

## 2019-03-06 NOTE — ED Notes (Signed)
Pt st having a ride home. Will arrive within 30 minutes.

## 2019-07-30 ENCOUNTER — Emergency Department
Admission: EM | Admit: 2019-07-30 | Discharge: 2019-07-30 | Disposition: A | Discharge status: Home / Self Care | Payer: Managed Care, Other (non HMO) | Attending: Emergency Medicine | Admitting: Emergency Medicine

## 2019-07-30 ENCOUNTER — Other Ambulatory Visit: Payer: Self-pay

## 2019-07-30 ENCOUNTER — Encounter: Payer: Self-pay | Admitting: Emergency Medicine

## 2019-07-30 DIAGNOSIS — R1110 Vomiting, unspecified: Secondary | ICD-10-CM | POA: Diagnosis present

## 2019-07-30 DIAGNOSIS — N390 Urinary tract infection, site not specified: Secondary | ICD-10-CM | POA: Diagnosis not present

## 2019-07-30 DIAGNOSIS — Z20828 Contact with and (suspected) exposure to other viral communicable diseases: Secondary | ICD-10-CM | POA: Insufficient documentation

## 2019-07-30 DIAGNOSIS — Z794 Long term (current) use of insulin: Secondary | ICD-10-CM | POA: Insufficient documentation

## 2019-07-30 DIAGNOSIS — Z79899 Other long term (current) drug therapy: Secondary | ICD-10-CM | POA: Insufficient documentation

## 2019-07-30 DIAGNOSIS — I10 Essential (primary) hypertension: Secondary | ICD-10-CM | POA: Diagnosis not present

## 2019-07-30 DIAGNOSIS — E109 Type 1 diabetes mellitus without complications: Secondary | ICD-10-CM | POA: Diagnosis not present

## 2019-07-30 DIAGNOSIS — R112 Nausea with vomiting, unspecified: Secondary | ICD-10-CM | POA: Insufficient documentation

## 2019-07-30 LAB — URINALYSIS, COMPLETE (UACMP) WITH MICROSCOPIC
Bilirubin Urine: NEGATIVE
Glucose, UA: 150 mg/dL — AB
Ketones, ur: 5 mg/dL — AB
Nitrite: POSITIVE — AB
Protein, ur: 100 mg/dL — AB
Specific Gravity, Urine: 1.013 (ref 1.005–1.030)
WBC, UA: >50 WBC/hpf — ABNORMAL HIGH (ref 0–5)
pH: 5 (ref 5.0–8.0)

## 2019-07-30 LAB — COMPREHENSIVE METABOLIC PANEL
ALT: 20 U/L (ref 0–44)
AST: 24 U/L (ref 15–41)
Albumin: 3.4 g/dL — ABNORMAL LOW (ref 3.5–5.0)
Alkaline Phosphatase: 77 U/L (ref 38–126)
Anion gap: 8 (ref 5–15)
BUN: 12 mg/dL (ref 6–20)
CO2: 27 mmol/L (ref 22–32)
Calcium: 9.1 mg/dL (ref 8.9–10.3)
Chloride: 102 mmol/L (ref 98–111)
Creatinine, Ser: 0.75 mg/dL (ref 0.44–1.00)
GFR calc Af Amer: >60 mL/min (ref >60)
GFR calc non Af Amer: >60 mL/min (ref >60)
Glucose, Bld: 264 mg/dL — ABNORMAL HIGH (ref 70–99)
Potassium: 3.7 mmol/L (ref 3.5–5.1)
Sodium: 137 mmol/L (ref 135–145)
Total Bilirubin: 1.3 mg/dL — ABNORMAL HIGH (ref 0.3–1.2)
Total Protein: 7.2 g/dL (ref 6.5–8.1)

## 2019-07-30 LAB — POCT PREGNANCY, URINE: Preg Test, Ur: NEGATIVE

## 2019-07-30 LAB — CBC
HCT: 33.6 % — ABNORMAL LOW (ref 36.0–46.0)
Hemoglobin: 11.7 g/dL — ABNORMAL LOW (ref 12.0–15.0)
MCH: 29.8 pg (ref 26.0–34.0)
MCHC: 34.8 g/dL (ref 30.0–36.0)
MCV: 85.5 fL (ref 80.0–100.0)
Platelets: 331 10*3/uL (ref 150–400)
RBC: 3.93 MIL/uL (ref 3.87–5.11)
RDW: 11.6 % (ref 11.5–15.5)
WBC: 4.3 10*3/uL (ref 4.0–10.5)
nRBC: 0 % (ref 0.0–0.2)

## 2019-07-30 LAB — LIPASE, BLOOD: Lipase: 16 U/L (ref 11–51)

## 2019-07-30 MED ORDER — CEPHALEXIN 500 MG PO CAPS: 500.0000 mg | ORAL_CAPSULE | Freq: Two times a day (BID) | ORAL | 0 refills | Status: DC

## 2019-07-30 MED ORDER — ONDANSETRON 4 MG PO TBDP: 4.0000 mg | ORAL_TABLET | Freq: Once | ORAL | Status: DC

## 2019-07-30 MED ORDER — ONDANSETRON 4 MG PO TBDP: 4.0000 mg | ORAL_TABLET | Freq: Three times a day (TID) | ORAL | 0 refills | Status: DC | PRN

## 2019-07-30 MED ORDER — METOCLOPRAMIDE HCL 5 MG/ML IJ SOLN
20.0000 mg | Freq: Once | INTRAVENOUS | Status: AC
  Administered 2019-07-30: 20 mg via INTRAVENOUS
  Filled 2019-07-30: qty 4

## 2019-07-30 MED ORDER — SODIUM CHLORIDE 0.9 % IV SOLN
1.0000 g | Freq: Once | INTRAVENOUS | Status: AC
  Administered 2019-07-30: 1 g via INTRAVENOUS
  Filled 2019-07-30: qty 10

## 2019-07-30 MED ORDER — KETOROLAC TROMETHAMINE 30 MG/ML IJ SOLN
15.0000 mg | Freq: Once | INTRAMUSCULAR | Status: AC
  Administered 2019-07-30: 18:00:00 15 mg via INTRAVENOUS
  Filled 2019-07-30: qty 1

## 2019-07-30 MED ORDER — SODIUM CHLORIDE 0.9 % IV SOLN
1000.0000 mL | Freq: Once | INTRAVENOUS | Status: AC
  Administered 2019-07-30: 1000 mL via INTRAVENOUS

## 2019-07-30 MED ORDER — KETOROLAC TROMETHAMINE 30 MG/ML IJ SOLN: 30.0000 mg | Freq: Once | INTRAMUSCULAR | Status: DC

## 2019-07-30 MED ORDER — ONDANSETRON 4 MG PO TBDP: 4.0000 mg | ORAL_TABLET | Freq: Once | ORAL | Status: DC | PRN

## 2019-07-30 MED ORDER — DIPHENHYDRAMINE HCL 50 MG/ML IJ SOLN
25.0000 mg | Freq: Once | INTRAMUSCULAR | Status: AC
  Administered 2019-07-30: 25 mg via INTRAVENOUS
  Filled 2019-07-30: qty 1

## 2019-07-30 MED ORDER — ONDANSETRON HCL 4 MG/2ML IJ SOLN
4.0000 mg | Freq: Once | INTRAMUSCULAR | Status: AC
  Administered 2019-07-30: 4 mg via INTRAVENOUS
  Filled 2019-07-30: qty 2

## 2019-07-30 NOTE — ED Triage Notes (Signed)
Pt arrived via POV with reports of vomiting since 11am this morning and HA.

## 2019-07-30 NOTE — ED Provider Notes (Signed)
[facility] Emergency Department Provider Note   ____________________________________________    I have reviewed the triage vital signs and the nursing notes.   HISTORY  Chief Complaint Emesis     HPI Emma Thomas is a 23 y.o. female who presents with complaints of nausea and vomiting.  Patient has type 1 diabetes, but reports her glucose has been relatively well controlled recently.  She reports yesterday she felt queasy and this morning she started vomiting.  She denies severe abdominal pain, denies diarrhea.  No fevers or chills.  No sick contacts.  No recent travel.  Has not take anything for this.  Past Medical History:  Diagnosis Date  . Diabetes mellitus without complication (HCC)    Type 1 DM  . Essential hypertension   . Neurologic disorder    Both feet    Patient Active Problem List   Diagnosis Date Noted  . High anion gap metabolic acidosis   . AKI (acute kidney injury) ([place])   . Liver function abnormality   . Abdominal pain   . Pyelonephritis 07/30/2016  . Sepsis ([place]) 06/08/2016  . DKA (diabetic ketoacidoses) ([place]) 09/09/2015    Past Surgical History:  Procedure Laterality Date  . abscess removal     excision of bartholin cyst  . Nexplanon  01/2011    Prior to Admission medications   Medication Sig Start Date End Date Taking? Authorizing Provider  cephALEXin (KEFLEX) 500 MG capsule Take 1 capsule (500 mg total) by mouth 2 (two) times daily. 07/30/19   Lavonia Drafts, MD  clindamycin (CLEOCIN) 300 MG capsule Take 1 capsule (300 mg total) by mouth 4 (four) times daily. 03/06/19   Cuthriell, Charline Bills, PA-C  HYDROcodone-acetaminophen (NORCO/VICODIN) 5-325 MG tablet Take 1 tablet by mouth every 4 (four) hours as needed for moderate pain. 03/06/19   Cuthriell, Charline Bills, PA-C  insulin aspart (NOVOLOG) 100 UNIT/ML injection Inject 3 Units into the skin 3 (three) times daily with meals. 08/08/18   Vaughan Basta, MD   insulin detemir (LEVEMIR) 100 UNIT/ML injection Inject 0.1 mLs (10 Units total) into the skin daily. 08/09/18   Vaughan Basta, MD  ondansetron (ZOFRAN ODT) 4 MG disintegrating tablet Take 1 tablet (4 mg total) by mouth every 8 (eight) hours as needed. 07/30/19   Lavonia Drafts, MD  SUMAtriptan (IMITREX) 50 MG tablet Take 1 tablet by mouth as needed. 06/11/17   [provider]     Allergies [name].  Family History  Problem Relation Age of Onset  . Breast cancer Mother 23  . Lung cancer Maternal Grandmother   . Diabetes type II Paternal Grandmother     Social History Social History   Tobacco Use  . Smoking status: Never Smoker  . Smokeless tobacco: Never Used  Substance Use Topics  . Alcohol use: Not Currently    Alcohol/week: 2.0 standard drinks    Types: 2 Shots of liquor per week  . Drug use: No    Review of Systems  Constitutional: No fever/chills Eyes: No visual changes.  ENT: Sore throat from vomiting Cardiovascular: Denies chest pain. Respiratory: Denies shortness of breath. Gastrointestinal: As above Genitourinary: Mild dysuria, no flank pain Musculoskeletal: Negative for back pain. Skin: Negative for rash. Neurological: Negative for headaches    ____________________________________________   PHYSICAL EXAM:  VITAL SIGNS: ED Triage Vitals [07/30/19 1718]  Enc Vitals Group     BP (!) 142/111     Pulse Rate (!) 102  Resp 18     Temp 98 F (36.7 C)     Temp Source Oral     SpO2 100 %     Weight 65.8 kg (145 lb)     Height 1.676 m (5\' 6" )     Head Circumference      Peak Flow      Pain Score 8     Pain Loc      Pain Edu?      Excl. in [place]?     Constitutional: Alert and oriented.  Eyes: Conjunctivae are normal.   Nose: No congestion/rhinnorhea. Mouth/Throat: Mucous membranes are moist.    Cardiovascular: Normal rate, regular rhythm.  Good peripheral circulation. Respiratory: Normal respiratory effort.   No retractions.  Gastrointestinal: Soft and nontender. No distention.  No CVA tenderness.  Musculoskeletal: Warm and well perfused Neurologic:  Normal speech and language. No gross focal neurologic deficits are appreciated.  Skin:  Skin is warm, dry and intact. No rash noted. Psychiatric: Mood and affect are normal. Speech and behavior are normal.  ____________________________________________   LABS (all labs ordered are listed, but only abnormal results are displayed)  Labs Reviewed  COMPREHENSIVE METABOLIC PANEL - Abnormal; Notable for the following components:      Result Value   Glucose, Bld 264 (*)    Albumin 3.4 (*)    Total Bilirubin 1.3 (*)    All other components within normal limits  CBC - Abnormal; Notable for the following components:   Hemoglobin 11.7 (*)    HCT 33.6 (*)    All other components within normal limits  URINALYSIS, COMPLETE (UACMP) WITH MICROSCOPIC - Abnormal; Notable for the following components:   Color, Urine YELLOW (*)    APPearance CLOUDY (*)    Glucose, UA 150 (*)    Hgb urine dipstick LARGE (*)    Ketones, ur 5 (*)    Protein, ur 100 (*)    Nitrite POSITIVE (*)    Leukocytes,Ua MODERATE (*)    WBC, UA >50 (*)    Bacteria, UA MANY (*)    All other components within normal limits  SARS CORONAVIRUS 2 (TAT 6-24 HRS)  LIPASE, BLOOD  POC URINE PREG, ED  POCT PREGNANCY, URINE   ____________________________________________  EKG  None ____________________________________________  RADIOLOGY  None ____________________________________________   PROCEDURES  Procedure(s) performed: No  Procedures   Critical Care performed: No ____________________________________________   INITIAL IMPRESSION / ASSESSMENT AND PLAN / ED COURSE  Pertinent labs & imaging results that were available during my care of the patient were reviewed by me and considered in my medical decision making (see chart for details).  Patient with a history of  diabetes presents with nausea and vomiting, will give IV fluids, IV Zofran while we await labs, doubt DKA, suspect viral gastroenteritis/gastritis which is common in the community this time.  Reassuring abdominal exam.  Lab work is overall unremarkable, normal anion gap.  Urinalysis consistent with UTI.  Will give IV Rocephin although no CVA tenderness or white count to suggest pyelonephritis     ____________________________________________   FINAL CLINICAL IMPRESSION(S) / ED DIAGNOSES  Final diagnoses:  Non-intractable vomiting with nausea, unspecified vomiting type  Lower urinary tract infectious disease        Note:  This document was prepared using Dragon voice recognition software and may include unintentional dictation errors.   Lavonia Drafts, MD 07/30/19 2055

## 2019-07-30 NOTE — ED Notes (Signed)
Pt c/o nausea and vomiting since 1100 today. Pt states she is unable to tolerate any liquids or food. Pt says she is unable to count the number of times she threw up since this stated Pt also c/o dull pain on the anterior aspect of her head. Pt tried to take Aleve but was unable to keep it down. Denies any fevers, chest pain, SOB, and cough.

## 2019-07-30 NOTE — ED Notes (Signed)
PAtient had episode of emesis

## 2019-07-31 LAB — SARS CORONAVIRUS 2 (TAT 6-24 HRS): SARS Coronavirus 2: NEGATIVE

## 2019-09-07 ENCOUNTER — Ambulatory Visit: Payer: Managed Care, Other (non HMO) | Admitting: Advanced Practice Midwife

## 2019-09-30 ENCOUNTER — Encounter: Payer: Self-pay | Admitting: Emergency Medicine

## 2019-09-30 ENCOUNTER — Emergency Department
Admission: EM | Admit: 2019-09-30 | Discharge: 2019-09-30 | Disposition: A | Discharge status: Home / Self Care | Payer: Managed Care, Other (non HMO) | Attending: Emergency Medicine | Admitting: Emergency Medicine

## 2019-09-30 ENCOUNTER — Other Ambulatory Visit: Payer: Self-pay

## 2019-09-30 DIAGNOSIS — Z3490 Encounter for supervision of normal pregnancy, unspecified, unspecified trimester: Secondary | ICD-10-CM | POA: Insufficient documentation

## 2019-09-30 DIAGNOSIS — N390 Urinary tract infection, site not specified: Secondary | ICD-10-CM | POA: Insufficient documentation

## 2019-09-30 LAB — URINALYSIS, COMPLETE (UACMP) WITH MICROSCOPIC
Bilirubin Urine: NEGATIVE
Glucose, UA: 150 mg/dL — AB
Ketones, ur: 5 mg/dL — AB
Nitrite: NEGATIVE
Protein, ur: 100 mg/dL — AB
Specific Gravity, Urine: 1.015 (ref 1.005–1.030)
WBC, UA: >50 WBC/hpf — ABNORMAL HIGH (ref 0–5)
pH: 5 (ref 5.0–8.0)

## 2019-09-30 LAB — POCT PREGNANCY, URINE: Preg Test, Ur: POSITIVE — AB

## 2019-09-30 LAB — BASIC METABOLIC PANEL
Anion gap: 12 (ref 5–15)
BUN: 29 mg/dL — ABNORMAL HIGH (ref 6–20)
CO2: 20 mmol/L — ABNORMAL LOW (ref 22–32)
Calcium: 8.7 mg/dL — ABNORMAL LOW (ref 8.9–10.3)
Chloride: 98 mmol/L (ref 98–111)
Creatinine, Ser: 1.83 mg/dL — ABNORMAL HIGH (ref 0.44–1.00)
GFR calc Af Amer: 44 mL/min — ABNORMAL LOW (ref >60)
GFR calc non Af Amer: 38 mL/min — ABNORMAL LOW (ref >60)
Glucose, Bld: 437 mg/dL — ABNORMAL HIGH (ref 70–99)
Potassium: 4.1 mmol/L (ref 3.5–5.1)
Sodium: 130 mmol/L — ABNORMAL LOW (ref 135–145)

## 2019-09-30 LAB — CBC
HCT: 30.5 % — ABNORMAL LOW (ref 36.0–46.0)
Hemoglobin: 10.8 g/dL — ABNORMAL LOW (ref 12.0–15.0)
MCH: 29.9 pg (ref 26.0–34.0)
MCHC: 35.4 g/dL (ref 30.0–36.0)
MCV: 84.5 fL (ref 80.0–100.0)
Platelets: 401 10*3/uL — ABNORMAL HIGH (ref 150–400)
RBC: 3.61 MIL/uL — ABNORMAL LOW (ref 3.87–5.11)
RDW: 11.3 % — ABNORMAL LOW (ref 11.5–15.5)
WBC: 7.7 10*3/uL (ref 4.0–10.5)
nRBC: 0 % (ref 0.0–0.2)

## 2019-09-30 LAB — GLUCOSE, CAPILLARY: Glucose-Capillary: 393 mg/dL — ABNORMAL HIGH (ref 70–99)

## 2019-09-30 LAB — HCG, QUANTITATIVE, PREGNANCY: hCG, Beta Chain, Quant, S: 137401 m[IU]/mL — ABNORMAL HIGH (ref <5)

## 2019-09-30 MED ORDER — SODIUM CHLORIDE 0.9 % IV SOLN
1.0000 g | Freq: Once | INTRAVENOUS | Status: AC
  Administered 2019-09-30: 13:00:00 1 g via INTRAVENOUS
  Filled 2019-09-30: qty 10

## 2019-09-30 MED ORDER — CEPHALEXIN 500 MG PO CAPS: 500.0000 mg | ORAL_CAPSULE | Freq: Four times a day (QID) | ORAL | 0 refills | Status: DC

## 2019-09-30 MED ORDER — SODIUM CHLORIDE 0.9 % IV BOLUS
2000.0000 mL | Freq: Once | INTRAVENOUS | Status: AC
  Administered 2019-09-30: 2000 mL via INTRAVENOUS

## 2019-09-30 NOTE — ED Notes (Signed)
Jonathan PA at bedside

## 2019-09-30 NOTE — ED Notes (Signed)
Pt up to bedside toilet.  

## 2019-09-30 NOTE — ED Provider Notes (Signed)
[facility] Emergency Department Provider Note  ____________________________________________  Time seen: Approximately 12:03 PM  I have reviewed the triage vital signs and the nursing notes.   HISTORY  Chief Complaint Flank Pain    HPI Emma Thomas is a 23 y.o. female who presents to the emergency department concerned that she may have a urinary tract infection.  Patient states that she has had urinary urgency, dark foul-smelling urine,  and intermittent left flank pain.  Patient reports that she had a urinary tract infection 1 month ago with similar symptoms.  Patient states that she took the entire course of antibiotics and states that all her symptoms had improved.  She does not have a history of recurrent urinary tract infections.  Patient does have a history of diabetes, hypertension and apparently neuropathy.  Patient has had history of pyelonephritis in the past as well as a history of acute kidney injuries.  Patient denies any complaints with her diabetes at this time.  She denies any fevers or chills, abdominal pain, vaginal bleeding or discharge.  Patient states that her flank pain is intermittent and not present currently.        Past Medical History:  Diagnosis Date  . Diabetes mellitus without complication (HCC)    Type 1 DM  . Essential hypertension   . Neurologic disorder    Both feet    Patient Active Problem List   Diagnosis Date Noted  . High anion gap metabolic acidosis   . AKI (acute kidney injury) ([place])   . Liver function abnormality   . Abdominal pain   . Pyelonephritis 07/30/2016  . Sepsis ([place]) 06/08/2016  . DKA (diabetic ketoacidoses) ([place]) 09/09/2015    Past Surgical History:  Procedure Laterality Date  . abscess removal     excision of bartholin cyst  . Nexplanon  01/2011    Prior to Admission medications   Medication Sig Start Date End Date Taking? Authorizing Provider  cephALEXin (KEFLEX) 500 MG capsule Take 1  capsule (500 mg total) by mouth 4 (four) times daily. 09/30/19   Cuthriell, Charline Bills, PA-C  clindamycin (CLEOCIN) 300 MG capsule Take 1 capsule (300 mg total) by mouth 4 (four) times daily. 03/06/19   Cuthriell, Charline Bills, PA-C  HYDROcodone-acetaminophen (NORCO/VICODIN) 5-325 MG tablet Take 1 tablet by mouth every 4 (four) hours as needed for moderate pain. 03/06/19   Cuthriell, Charline Bills, PA-C  insulin aspart (NOVOLOG) 100 UNIT/ML injection Inject 3 Units into the skin 3 (three) times daily with meals. 08/08/18   Vaughan Basta, MD  insulin detemir (LEVEMIR) 100 UNIT/ML injection Inject 0.1 mLs (10 Units total) into the skin daily. 08/09/18   Vaughan Basta, MD  ondansetron (ZOFRAN ODT) 4 MG disintegrating tablet Take 1 tablet (4 mg total) by mouth every 8 (eight) hours as needed. 07/30/19   Lavonia Drafts, MD  SUMAtriptan (IMITREX) 50 MG tablet Take 1 tablet by mouth as needed. 06/11/17   [provider]    Allergies [name].  Family History  Problem Relation Age of Onset  . Breast cancer Mother 53  . Lung cancer Maternal Grandmother   . Diabetes type II Paternal Grandmother     Social History Social History   Tobacco Use  . Smoking status: Never Smoker  . Smokeless tobacco: Never Used  Substance Use Topics  . Alcohol use: Not Currently    Alcohol/week: 2.0 standard drinks    Types: 2 Shots of liquor per week  .  Drug use: No     Review of Systems  Constitutional: No fever/chills Eyes: No visual changes. No discharge ENT: No upper respiratory complaints. Cardiovascular: no chest pain. Respiratory: no cough. No SOB. Gastrointestinal: No abdominal pain.  No nausea, no vomiting.  No diarrhea.  No constipation. Genitourinary: Negative for dysuria. No hematuria.  Positive for dark/foul-smelling urine.  Positive for urinary urgency. Musculoskeletal: Negative for musculoskeletal pain. Skin: Negative for rash, abrasions, lacerations,  ecchymosis. Neurological: Negative for headaches, focal weakness or numbness. 10-point ROS otherwise negative.  ____________________________________________   PHYSICAL EXAM:  VITAL SIGNS: ED Triage Vitals  Enc Vitals Group     BP 09/30/19 1030 133/79     Pulse Rate 09/30/19 1030 (!) 103     Resp 09/30/19 1030 16     Temp 09/30/19 1030 98.6 F (37 C)     Temp Source 09/30/19 1030 Oral     SpO2 09/30/19 1030 100 %     Weight 09/30/19 1040 150 lb (68 kg)     Height 09/30/19 1040 5\' 6"  (1.676 m)     Head Circumference --      Peak Flow --      Pain Score 09/30/19 1040 8     Pain Loc --      Pain Edu? --      Excl. in [place]? --      Constitutional: Alert and oriented. Well appearing and in no acute distress. Eyes: Conjunctivae are normal. PERRL. EOMI. Head: Atraumatic. ENT:      Ears:       Nose: No congestion/rhinnorhea.      Mouth/Throat: Mucous membranes are moist.  Neck: No stridor.    Cardiovascular: Normal rate, regular rhythm. Normal S1 and S2.  Good peripheral circulation. Respiratory: Normal respiratory effort without tachypnea or retractions. Lungs CTAB. Good air entry to the bases with no decreased or absent breath sounds. Gastrointestinal: No visible external abdominal wall findings.  Bowel sounds 4 quadrants. Soft and nontender to palpation all quadrants and suprapubic region.. No guarding or rigidity. No palpable masses. No distention. No CVA tenderness. Musculoskeletal: Full range of motion to all extremities. No gross deformities appreciated. Neurologic:  Normal speech and language. No gross focal neurologic deficits are appreciated.  Skin:  Skin is warm, dry and intact. No rash noted. Psychiatric: Mood and affect are normal. Speech and behavior are normal. Patient exhibits appropriate insight and judgement.   ____________________________________________   LABS (all labs ordered are listed, but only abnormal results are displayed)  Labs Reviewed   URINALYSIS, COMPLETE (UACMP) WITH MICROSCOPIC - Abnormal; Notable for the following components:      Result Value   Color, Urine YELLOW (*)    APPearance CLOUDY (*)    Glucose, UA 150 (*)    Hgb urine dipstick MODERATE (*)    Ketones, ur 5 (*)    Protein, ur 100 (*)    Leukocytes,Ua LARGE (*)    WBC, UA >50 (*)    Bacteria, UA MANY (*)    All other components within normal limits  BASIC METABOLIC PANEL - Abnormal; Notable for the following components:   Sodium 130 (*)    CO2 20 (*)    Glucose, Bld 437 (*)    BUN 29 (*)    Creatinine, Ser 1.83 (*)    Calcium 8.7 (*)    GFR calc non Af Amer 38 (*)    GFR calc Af Amer 44 (*)    All other components within normal limits  CBC - Abnormal; Notable for the following components:   RBC 3.61 (*)    Hemoglobin 10.8 (*)    HCT 30.5 (*)    RDW 11.3 (*)    Platelets 401 (*)    All other components within normal limits  HCG, QUANTITATIVE, PREGNANCY - Abnormal; Notable for the following components:   hCG, Beta Chain, Quant, S 137,401 (*)    All other components within normal limits  GLUCOSE, CAPILLARY - Abnormal; Notable for the following components:   Glucose-Capillary 393 (*)    All other components within normal limits  POCT PREGNANCY, URINE - Abnormal; Notable for the following components:   Preg Test, Ur POSITIVE (*)    All other components within normal limits  POC URINE PREG, ED   ____________________________________________  EKG   ____________________________________________  RADIOLOGY   No results found.  ____________________________________________    PROCEDURES  Procedure(s) performed:    Procedures    Medications  cefTRIAXone (ROCEPHIN) 1 g in sodium chloride 0.9 % 100 mL IVPB (0 g Intravenous Stopped 09/30/19 1305)  sodium chloride 0.9 % bolus 2,000 mL (0 mLs Intravenous Stopped 09/30/19 1602)     ____________________________________________   INITIAL IMPRESSION / ASSESSMENT AND PLAN / ED  COURSE  Pertinent labs & imaging results that were available during my care of the patient were reviewed by me and considered in my medical decision making (see chart for details).  Review of the [place] CSRS was performed in accordance of the [place] prior to dispensing any controlled drugs.  Clinical Course as of Sep 29 1645  Sat Sep 30, 2019  1225 Patient presents emergency department with complaints of urinary urgency, dark foul-smelling cloudy urine with intermittent left leg pain.  Patient has a history of urinary tract infections though she denies them as being recurrent.  Patient did have a UTI approximately a month ago that was treated with complete resolution of symptoms.  No fevers or chills.  No flank pain currently.  No CVA tenderness on exam.  No abdominal tenderness.  When labs, patient has findings consistent with urinary tract infection.  Patient is also pregnant, however patient denied realizes prior to today's encounter.  She does not have an OB/GYN with and will be referred upon discharge.  At this time no abdominal pain, no vaginal bleeding or discharge.  Patient does have slight hyponatremia, elevated blood glucose reading, evidence of acute kidney injury.  Patient will be given IV Rocephin and fluids here in the emergency department.   [JC]    Clinical Course User Index [JC] Cuthriell, Charline Bills, PA-C          Patient's diagnosis is consistent with pregnant, UTI.  Patient presented to the emergency department complaining of symptoms concerning for urinary tract infection.  During initial work-up, patient was found to be pregnant.  Patient did not realize she was pregnant at this time.  Patient had findings consistent with mild dehydration, urinary tract infection.  Patient was given IV fluids and IV antibiotic care in the emergency department.  At this time, patient is stable for discharge and will be discharged home with course of oral antibiotics.  Follow-up with OB/GYN.  Return  precautions are discussed with the patient..  Patient is given ED precautions to return to the ED for any worsening or new symptoms.     ____________________________________________  FINAL CLINICAL IMPRESSION(S) / ED DIAGNOSES  Final diagnoses:  Pregnancy, unspecified gestational age      NEW MEDICATIONS STARTED DURING THIS VISIT:  ED Discharge Orders         Ordered    cephALEXin (KEFLEX) 500 MG capsule  4 times daily     09/30/19 1641              This chart was dictated using voice recognition software/Dragon. Despite best efforts to proofread, errors can occur which can change the meaning. Any change was purely unintentional.    Darletta Moll, PA-C 09/30/19 1646    Lavonia Drafts, MD 10/01/19 [phone]

## 2019-09-30 NOTE — ED Notes (Signed)
Pt given 2 warm blankets

## 2019-09-30 NOTE — ED Triage Notes (Signed)
Patient presents to the ED with left flank pain, dark urine, urinary frequency with halting urination.  Patient states, "this feels like when I have a kidney infection."  Patient is in no obvious distress at this time.

## 2019-10-03 ENCOUNTER — Telehealth: Payer: Self-pay

## 2019-10-03 NOTE — Telephone Encounter (Signed)
Patient is schedule 10/25/19

## 2019-10-03 NOTE — Telephone Encounter (Signed)
Pt calling to see how much prenatal care will be without insurance.  [phone]

## 2019-10-25 ENCOUNTER — Ambulatory Visit (INDEPENDENT_AMBULATORY_CARE_PROVIDER_SITE_OTHER): Payer: BC Managed Care – PPO | Admitting: Obstetrics and Gynecology

## 2019-10-25 ENCOUNTER — Other Ambulatory Visit: Payer: Self-pay

## 2019-10-25 ENCOUNTER — Ambulatory Visit (INDEPENDENT_AMBULATORY_CARE_PROVIDER_SITE_OTHER): Payer: BC Managed Care – PPO

## 2019-10-25 ENCOUNTER — Other Ambulatory Visit (HOSPITAL_COMMUNITY)
Admission: RE | Admit: 2019-10-25 | Discharge: 2019-10-25 | Disposition: A | Discharge status: Home / Self Care | Payer: BC Managed Care – PPO | Source: Ambulatory Visit | Attending: Obstetrics and Gynecology | Admitting: Obstetrics and Gynecology

## 2019-10-25 ENCOUNTER — Encounter: Payer: Self-pay | Admitting: Obstetrics and Gynecology

## 2019-10-25 VITALS — BP 130/80 | Temp 97.9°F | Wt 145.0 lb

## 2019-10-25 DIAGNOSIS — Z3A17 17 weeks gestation of pregnancy: Secondary | ICD-10-CM

## 2019-10-25 DIAGNOSIS — O0992 Supervision of high risk pregnancy, unspecified, second trimester: Secondary | ICD-10-CM

## 2019-10-25 DIAGNOSIS — O26892 Other specified pregnancy related conditions, second trimester: Secondary | ICD-10-CM

## 2019-10-25 DIAGNOSIS — O0990 Supervision of high risk pregnancy, unspecified, unspecified trimester: Secondary | ICD-10-CM

## 2019-10-25 DIAGNOSIS — E109 Type 1 diabetes mellitus without complications: Secondary | ICD-10-CM

## 2019-10-25 DIAGNOSIS — O24012 Pre-existing diabetes mellitus, type 1, in pregnancy, second trimester: Secondary | ICD-10-CM

## 2019-10-25 DIAGNOSIS — Z3687 Encounter for antenatal screening for uncertain dates: Secondary | ICD-10-CM | POA: Diagnosis not present

## 2019-10-25 DIAGNOSIS — R011 Cardiac murmur, unspecified: Secondary | ICD-10-CM

## 2019-10-25 DIAGNOSIS — Z124 Encounter for screening for malignant neoplasm of cervix: Secondary | ICD-10-CM | POA: Diagnosis present

## 2019-10-25 NOTE — Patient Instructions (Signed)
First Trimester of Pregnancy The first trimester of pregnancy is from week 1 until the end of week 13 (months 1 through 3). A week after a sperm fertilizes an egg, the egg will implant on the wall of the uterus. This embryo will begin to develop into a baby. Genes from you and your partner will form the baby. The female genes will determine whether the baby will be a boy or a girl. At 6-8 weeks, the eyes and face will be formed, and the heartbeat can be seen on ultrasound. At the end of 12 weeks, all the baby's organs will be formed. Now that you are pregnant, you will want to do everything you can to have a healthy baby. Two of the most important things are to get good prenatal care and to follow your health care provider's instructions. Prenatal care is all the medical care you receive before the baby's birth. This care will help prevent, find, and treat any problems during the pregnancy and childbirth. Body changes during your first trimester Your body goes through many changes during pregnancy. The changes vary from woman to woman.  You may gain or lose a couple of pounds at first.  You may feel sick to your stomach (nauseous) and you may throw up (vomit). If the vomiting is uncontrollable, call your health care provider.  You may tire easily.  You may develop headaches that can be relieved by medicines. All medicines should be approved by your health care provider.  You may urinate more often. Painful urination may mean you have a bladder infection.  You may develop heartburn as a result of your pregnancy.  You may develop constipation because certain hormones are causing the muscles that push stool through your intestines to slow down.  You may develop hemorrhoids or swollen veins (varicose veins).  Your breasts may begin to grow larger and become tender. Your nipples may stick out more, and the tissue that surrounds them (areola) may become darker.  Your gums may bleed and may be  sensitive to brushing and flossing.  Dark spots or blotches (chloasma, mask of pregnancy) may develop on your face. This will likely fade after the baby is born.  Your menstrual periods will stop.  You may have a loss of appetite.  You may develop cravings for certain kinds of food.  You may have changes in your emotions from day to day, such as being excited to be pregnant or being concerned that something may go wrong with the pregnancy and baby.  You may have more vivid and strange dreams.  You may have changes in your hair. These can include thickening of your hair, rapid growth, and changes in texture. Some women also have hair loss during or after pregnancy, or hair that feels dry or thin. Your hair will most likely return to normal after your baby is born. What to expect at prenatal visits During a routine prenatal visit:  You will be weighed to make sure you and the baby are growing normally.  Your blood pressure will be taken.  Your abdomen will be measured to track your baby's growth.  The fetal heartbeat will be listened to between weeks 10 and 14 of your pregnancy.  Test results from any previous visits will be discussed. Your health care provider may ask you:  How you are feeling.  If you are feeling the baby move.  If you have had any abnormal symptoms, such as leaking fluid, bleeding, severe headaches, or abdominal   cramping.  If you are using any tobacco products, including cigarettes, chewing tobacco, and electronic cigarettes.  If you have any questions. Other tests that may be performed during your first trimester include:  Blood tests to find your blood type and to check for the presence of any previous infections. The tests will also be used to check for low iron levels (anemia) and protein on red blood cells (Rh antibodies). Depending on your risk factors, or if you previously had diabetes during pregnancy, you may have tests to check for high blood sugar  that affects pregnant women (gestational diabetes).  Urine tests to check for infections, diabetes, or protein in the urine.  An ultrasound to confirm the proper growth and development of the baby.  Fetal screens for spinal cord problems (spina bifida) and Down syndrome.  HIV (human immunodeficiency virus) testing. Routine prenatal testing includes screening for HIV, unless you choose not to have this test.  You may need other tests to make sure you and the baby are doing well. Follow these instructions at home: Medicines  Follow your health care provider's instructions regarding medicine use. Specific medicines may be either safe or unsafe to take during pregnancy.  Take a prenatal vitamin that contains at least 600 micrograms (mcg) of folic acid.  If you develop constipation, try taking a stool softener if your health care provider approves. Eating and drinking   Eat a balanced diet that includes fresh fruits and vegetables, whole grains, good sources of protein such as meat, eggs, or tofu, and low-fat dairy. Your health care provider will help you determine the amount of weight gain that is right for you.  Avoid raw meat and uncooked cheese. These carry germs that can cause birth defects in the baby.  Eating four or five small meals rather than three large meals a day may help relieve nausea and vomiting. If you start to feel nauseous, eating a few soda crackers can be helpful. Drinking liquids between meals, instead of during meals, also seems to help ease nausea and vomiting.  Limit foods that are high in fat and processed sugars, such as fried and sweet foods.  To prevent constipation: ? Eat foods that are high in fiber, such as fresh fruits and vegetables, whole grains, and beans. ? Drink enough fluid to keep your urine clear or pale yellow. Activity  Exercise only as directed by your health care provider. Most women can continue their usual exercise routine during  pregnancy. Try to exercise for 30 minutes at least 5 days a week. Exercising will help you: ? Control your weight. ? Stay in shape. ? Be prepared for labor and delivery.  Experiencing pain or cramping in the lower abdomen or lower back is a good sign that you should stop exercising. Check with your health care provider before continuing with normal exercises.  Try to avoid standing for long periods of time. Move your legs often if you must stand in one place for a long time.  Avoid heavy lifting.  Wear low-heeled shoes and practice good posture.  You may continue to have sex unless your health care provider tells you not to. Relieving pain and discomfort  Wear a good support bra to relieve breast tenderness.  Take warm sitz baths to soothe any pain or discomfort caused by hemorrhoids. Use hemorrhoid cream if your health care provider approves.  Rest with your legs elevated if you have leg cramps or low back pain.  If you develop varicose veins in   your legs, wear support hose. Elevate your feet for 15 minutes, 3-4 times a day. Limit salt in your diet. Prenatal care  Schedule your prenatal visits by the twelfth week of pregnancy. They are usually scheduled monthly at first, then more often in the last 2 months before delivery.  Write down your questions. Take them to your prenatal visits.  Keep all your prenatal visits as told by your health care provider. This is important. Safety  Wear your seat belt at all times when driving.  Make a list of emergency phone numbers, including numbers for family, friends, the hospital, and police and fire departments. General instructions  Ask your health care provider for a referral to a local prenatal education class. Begin classes no later than the beginning of month 6 of your pregnancy.  Ask for help if you have counseling or nutritional needs during pregnancy. Your health care provider can offer advice or refer you to specialists for help  with various needs.  Do not use hot tubs, steam rooms, or saunas.  Do not douche or use tampons or scented sanitary pads.  Do not cross your legs for long periods of time.  Avoid cat litter boxes and soil used by cats. These carry germs that can cause birth defects in the baby and possibly loss of the fetus by miscarriage or stillbirth.  Avoid all smoking, herbs, alcohol, and medicines not prescribed by your health care provider. Chemicals in these products affect the formation and growth of the baby.  Do not use any products that contain nicotine or tobacco, such as cigarettes and e-cigarettes. If you need help quitting, ask your health care provider. You may receive counseling support and other resources to help you quit.  Schedule a dentist appointment. At home, brush your teeth with a soft toothbrush and be gentle when you floss. Contact a health care provider if:  You have dizziness.  You have mild pelvic cramps, pelvic pressure, or nagging pain in the abdominal area.  You have persistent nausea, vomiting, or diarrhea.  You have a bad smelling vaginal discharge.  You have pain when you urinate.  You notice increased swelling in your face, hands, legs, or ankles.  You are exposed to fifth disease or chickenpox.  You are exposed to German measles (rubella) and have never had it. Get help right away if:  You have a fever.  You are leaking fluid from your vagina.  You have spotting or bleeding from your vagina.  You have severe abdominal cramping or pain.  You have rapid weight gain or loss.  You vomit blood or material that looks like coffee grounds.  You develop a severe headache.  You have shortness of breath.  You have any kind of trauma, such as from a fall or a car accident. Summary  The first trimester of pregnancy is from week 1 until the end of week 13 (months 1 through 3).  Your body goes through many changes during pregnancy. The changes vary from  woman to woman.  You will have routine prenatal visits. During those visits, your health care provider will examine you, discuss any test results you may have, and talk with you about how you are feeling. This information is not intended to replace advice given to you by your health care provider. Make sure you discuss any questions you have with your health care provider. Document Released: 10/27/2001 Document Revised: 10/15/2017 Document Reviewed: 10/14/2016 Elsevier Patient Education  2020 [employer]  

## 2019-10-25 NOTE — Progress Notes (Signed)
NOB C/o nausea/vomiting and headaches Denies lof, some cramping no vb Declines flu shot

## 2019-10-25 NOTE — Progress Notes (Signed)
10/25/2019   Chief Complaint: Missed period  Transfer of Care Patient: no  History of Present Illness: Emma Thomas is a 23 y.o. G1P0 [redacted]w[redacted]d based on Patient's last menstrual period was 06/23/2019 (exact date). with an Estimated Date of Delivery: 03/29/20, with the above CC.   Her periods were: irregular periods. Her IUD was removed in March. She found out in the ER on 09/30/2019 that she was pregnant.   She was using no method when she conceived.  She has Positive signs or symptoms of nausea/vomiting of pregnancy. She has Negative signs or symptoms of miscarriage or preterm labor. She has had a small amount of cramping. She was not taking different medications around the time she conceived/early pregnancy. Since her LMP, she has used alcohol. A small amount before she knew she was pregnant.  Since her LMP, she has not used tobacco products Since her LMP, she has not used illegal drugs.    Current or past history of domestic violence. no  Infection History:  1. Since her LMP, she has not had a viral illness.  2. She denies close contact with children on a regular basis    3. She is unsure about if she has a history of chicken pox. She is uncertain about vaccination for chicken pox in the past. 4. Patient or partner has history of genital herpes  no 5. History of STI (GC, CT, HPV, syphilis, HIV) - yes: hx of chlamydia 6.  She does not live with someone with TB or TB exposed. 7. History of recent travel :  no 8. She identifies Negative Zika risk factors for her and her partner 20. There are not cats in the home in the home.  She understands that while pregnant she should not change cat litter.   Genetic Screening Questions: (Includes patient, baby's father, or anyone in either family)   1. Patient's age >/= 71 at [facility]  no 2. Thalassemia ([place], [place], [place], or Asian background): MCV<80  no 3. Neural tube defect (meningomyelocele, spina bifida, anencephaly)  no 4. Congenital  heart defect  no  5. Down syndrome  no 6. Tay-Sachs (Jewish, [place])  no 7. Canavan's Disease  no 8. Sickle cell disease or trait (African)  no  9. Hemophilia or other blood disorders  no  10. Muscular dystrophy  no  11. Cystic fibrosis  no  12. Huntington's Chorea  no  13. Mental retardation/autism  no 14. Other inherited genetic or chromosomal disorder  no 15. Maternal metabolic disorder (DM, PKU, etc)  yes 16. Patient or FOB with a child with a birth defect not listed above no  16a. Patient or FOB with a birth defect themselves no 17. Recurrent pregnancy loss, or stillbirth  no  18. Any medications since LMP other than prenatal vitamins (include vitamins, supplements, OTC meds, drugs, alcohol)  no 19. Any other genetic/environmental exposure to discuss  no  ROS:  ROS  OBGYN History: As per HPI. OB History  Gravida Para Term Preterm AB Living  1            SAB TAB Ectopic Multiple Live Births               # Outcome Date GA Lbr Len/2nd Weight Sex Delivery Anes PTL Lv  1 Current             Any issues with any prior pregnancies: no Any prior children are healthy, doing well, without any problems or issues: no Last pap smear  One year ago, unsure about the result History of STIs: Yes- history of Chlamydia at 61.    Past Medical History: Past Medical History:  Diagnosis Date  . Diabetes mellitus without complication (HCC)    Type 1 DM  . Essential hypertension   . Neurologic disorder    Both feet    Past Surgical History: Past Surgical History:  Procedure Laterality Date  . abscess removal     excision of bartholin cyst  . Nexplanon  01/2011    Family History:  Family History  Problem Relation Age of Onset  . Breast cancer Mother 2  . Lung cancer Maternal Grandmother   . Diabetes type II Paternal Grandmother    She denies any female cancers, bleeding or blood clotting disorders.   Social History:  Social History   Socioeconomic History  .  Marital status: Single    Spouse name: Not on file  . Number of children: Not on file  . Years of education: Not on file  . Highest education level: Not on file  Occupational History  . Not on file  Social Needs  . Financial resource strain: Not on file  . Food insecurity    Worry: Not on file    Inability: Not on file  . Transportation needs    Medical: Not on file    Non-medical: Not on file  Tobacco Use  . Smoking status: Never Smoker  . Smokeless tobacco: Never Used  Substance and Sexual Activity  . Alcohol use: Not Currently    Alcohol/week: 2.0 standard drinks    Types: 2 Shots of liquor per week  . Drug use: No  . Sexual activity: Yes  Lifestyle  . Physical activity    Days per week: Not on file    Minutes per session: Not on file  . Stress: Not on file  Relationships  . Social Herbalist on phone: Not on file    Gets together: Not on file    Attends religious service: Not on file    Active member of club or organization: Not on file    Attends meetings of clubs or organizations: Not on file    Relationship status: Not on file  . Intimate partner violence    Fear of current or ex partner: Not on file    Emotionally abused: Not on file    Physically abused: Not on file    Forced sexual activity: Not on file  Other Topics Concern  . Not on file  Social History Narrative  . Not on file    Allergy: No Known Allergies  Current Outpatient Medications:  Current Outpatient Medications:  .  cephALEXin (KEFLEX) 500 MG capsule, Take 1 capsule (500 mg total) by mouth 4 (four) times daily., Disp: 28 capsule, Rfl: 0 .  clindamycin (CLEOCIN) 300 MG capsule, Take 1 capsule (300 mg total) by mouth 4 (four) times daily., Disp: 28 capsule, Rfl: 0 .  HYDROcodone-acetaminophen (NORCO/VICODIN) 5-325 MG tablet, Take 1 tablet by mouth every 4 (four) hours as needed for moderate pain., Disp: 12 tablet, Rfl: 0 .  insulin aspart (NOVOLOG) 100 UNIT/ML injection, Inject 3  Units into the skin 3 (three) times daily with meals., Disp: 10 mL, Rfl: 1 .  insulin detemir (LEVEMIR) 100 UNIT/ML injection, Inject 0.1 mLs (10 Units total) into the skin daily., Disp: 10 mL, Rfl: 1 .  ondansetron (ZOFRAN ODT) 4 MG disintegrating tablet, Take 1 tablet (4 mg total) by mouth every  8 (eight) hours as needed., Disp: 20 tablet, Rfl: 0 .  SUMAtriptan (IMITREX) 50 MG tablet, Take 1 tablet by mouth as needed., Disp: , Rfl:    Physical Exam: Physical Exam  Constitutional: She is oriented to person, place, and time and well-developed, well-nourished, and in no distress.  HENT:  Head: Normocephalic and atraumatic.  Eyes: Pupils are equal, round, and reactive to light.  Neck: Normal range of motion. Neck supple. No thyromegaly present.  Cardiovascular: Normal rate and regular rhythm.  Murmur heard.  Systolic murmur is present with a grade of 1/6. Pulmonary/Chest: Effort normal.  Abdominal: Soft. Bowel sounds are normal. She exhibits no distension. There is no abdominal tenderness. There is no rebound and no guarding.  Genitourinary:    Genitourinary Comments: External: Normal appearing vulva. No lesions noted.  Speculum examination: Normal appearing cervix. No blood in the vaginal vault. no discharge.   Bimanual examination: Uterus midline, non-tender, normal in size, shape and contour.  No CMT. No adnexal masses. No adnexal tenderness. Pelvis not fixed.      Musculoskeletal: Normal range of motion.  Neurological: She is alert and oriented to person, place, and time.  Skin: Skin is warm and dry.  Psychiatric: Affect and judgment normal.  Nursing note and vitals reviewed.  Assessment: Emma Thomas is a 23 y.o. G1P0 [redacted]w[redacted]d based on Patient's last menstrual period was 06/23/2019 (exact date). with an Estimated Date of Delivery: 03/29/20,  for prenatal care.  Plan:  1) Avoid alcoholic beverages. 2) Patient encouraged not to smoke.  3) Discontinue the use of all non-medicinal drugs  and chemicals.  4) Take prenatal vitamins daily.  5) Seatbelt use advised 6) Nutrition, food safety (fish, cheese advisories, and high nitrite foods) and exercise discussed. 7) Hospital and practice style delivering at [facility] discussed  8) Patient is asked about travel to areas at risk for the Lamb virus, and counseled to avoid travel and exposure to mosquitoes or sexual partners who may have themselves been exposed to the virus. Testing is discussed, and will be ordered as appropriate.  9) Childbirth classes at [facility] advised 10) Genetic Screening, such as with 1st Trimester Screening, cell free fetal DNA, AFP testing, and Ultrasound, as well as with amniocentesis and CVS as appropriate, is discussed with patient. She plans to have genetic testing this pregnancy.   Type 1 diabetes: currently using Lantus 18 units QHS and a sliding scale of humalog. She carb counts before eating. She has been working on improving her glucose control. She follows with Dr. Seth Bake Diprincipe. She last saw her on 09/12/2019.  She has been a Type 1 diabetic since 23 yo. Her last hgba1c from a year ago in the computer was 14.7. Will obtain hgba1c and baseline labs. Will refer to MFM. Encouraged Rekia to continue to follow closely with her endocrinologist and notify her of her pregnancy.   Systolic heart murmur today on exam- will refer to cardiology and obtain EKG.   Patient will return this afternoon for a dating [place].   Discussed initiation of 81mg  ASA after [redacted] weeks gestation  Problem list reviewed and updated.  I discussed the assessment and treatment plan with the patient. The patient was provided an opportunity to ask questions and all were answered. The patient agreed with the plan and demonstrated an understanding of the instructions.  Adrian Prows MD [facility], [facility] 10/25/2019 9:33 AM

## 2019-10-26 LAB — MONITOR DRUG PROFILE 10(MW)
Amphetamine Scrn, Ur: NEGATIVE ng/mL
BARBITURATE SCREEN URINE: NEGATIVE ng/mL
BENZODIAZEPINE SCREEN, URINE: NEGATIVE ng/mL
CANNABINOIDS UR QL SCN: NEGATIVE ng/mL
Cocaine (Metab) Scrn, Ur: NEGATIVE ng/mL
Creatinine(Crt), U: 81.5 mg/dL (ref 20.0–300.0)
Methadone Screen, Urine: NEGATIVE ng/mL
OXYCODONE+OXYMORPHONE UR QL SCN: NEGATIVE ng/mL
Opiate Scrn, Ur: NEGATIVE ng/mL
Ph of Urine: 6.7 (ref 4.5–8.9)
Phencyclidine Qn, Ur: NEGATIVE ng/mL
Propoxyphene Scrn, Ur: NEGATIVE ng/mL

## 2019-10-26 LAB — PROTEIN / CREATININE RATIO, URINE
Creatinine, Urine: 88.2 mg/dL
Protein, Ur: 507.8 mg/dL
Protein/Creat Ratio: 5757 mg/g creat — ABNORMAL HIGH (ref 0–200)

## 2019-10-27 ENCOUNTER — Ambulatory Visit (INDEPENDENT_AMBULATORY_CARE_PROVIDER_SITE_OTHER): Payer: BLUE CROSS/BLUE SHIELD | Admitting: Cardiology

## 2019-10-27 ENCOUNTER — Other Ambulatory Visit: Payer: Self-pay

## 2019-10-27 ENCOUNTER — Encounter: Payer: Self-pay | Admitting: Cardiology

## 2019-10-27 VITALS — BP 120/76 | HR 92 | Temp 97.3°F | Ht 66.0 in | Wt 144.5 lb

## 2019-10-27 DIAGNOSIS — N186 End stage renal disease: Secondary | ICD-10-CM | POA: Insufficient documentation

## 2019-10-27 DIAGNOSIS — E559 Vitamin D deficiency, unspecified: Secondary | ICD-10-CM | POA: Insufficient documentation

## 2019-10-27 DIAGNOSIS — R011 Cardiac murmur, unspecified: Secondary | ICD-10-CM | POA: Diagnosis not present

## 2019-10-27 DIAGNOSIS — E119 Type 2 diabetes mellitus without complications: Secondary | ICD-10-CM | POA: Insufficient documentation

## 2019-10-27 DIAGNOSIS — E1065 Type 1 diabetes mellitus with hyperglycemia: Secondary | ICD-10-CM | POA: Insufficient documentation

## 2019-10-27 DIAGNOSIS — F329 Major depressive disorder, single episode, unspecified: Secondary | ICD-10-CM | POA: Insufficient documentation

## 2019-10-27 DIAGNOSIS — Z8659 Personal history of other mental and behavioral disorders: Secondary | ICD-10-CM | POA: Insufficient documentation

## 2019-10-27 DIAGNOSIS — Z8619 Personal history of other infectious and parasitic diseases: Secondary | ICD-10-CM | POA: Insufficient documentation

## 2019-10-27 DIAGNOSIS — F32A Depression, unspecified: Secondary | ICD-10-CM | POA: Insufficient documentation

## 2019-10-27 LAB — COMPREHENSIVE METABOLIC PANEL
ALT: 13 IU/L (ref 0–32)
AST: 15 IU/L (ref 0–40)
Albumin/Globulin Ratio: 1 — ABNORMAL LOW (ref 1.2–2.2)
Albumin: 3 g/dL — ABNORMAL LOW (ref 3.9–5.0)
Alkaline Phosphatase: 81 IU/L (ref 39–117)
BUN/Creatinine Ratio: 15 (ref 9–23)
BUN: 21 mg/dL — ABNORMAL HIGH (ref 6–20)
Bilirubin Total: 0.3 mg/dL (ref 0.0–1.2)
CO2: 19 mmol/L — ABNORMAL LOW (ref 20–29)
Calcium: 8.6 mg/dL — ABNORMAL LOW (ref 8.7–10.2)
Chloride: 103 mmol/L (ref 96–106)
Creatinine, Ser: 1.37 mg/dL — ABNORMAL HIGH (ref 0.57–1.00)
GFR calc Af Amer: 62 mL/min/{1.73_m2} (ref >59)
GFR calc non Af Amer: 54 mL/min/{1.73_m2} — ABNORMAL LOW (ref >59)
Globulin, Total: 3 g/dL (ref 1.5–4.5)
Glucose: 113 mg/dL — ABNORMAL HIGH (ref 65–99)
Potassium: 4.3 mmol/L (ref 3.5–5.2)
Sodium: 136 mmol/L (ref 134–144)
Total Protein: 6 g/dL (ref 6.0–8.5)

## 2019-10-27 LAB — HEMOGLOBINOPATHY EVALUATION
HGB C: 0 %
HGB S: 0 %
HGB VARIANT: 0 %
Hemoglobin A2 Quantitation: 2.1 % (ref 1.8–3.2)
Hemoglobin F Quantitation: 0 % (ref 0.0–2.0)
Hgb A: 97.9 % (ref 96.4–98.8)

## 2019-10-27 LAB — RPR+RH+ABO+RUB AB+AB SCR+CB...
Antibody Screen: NEGATIVE
HIV Screen 4th Generation wRfx: NONREACTIVE
Hematocrit: 26 % — ABNORMAL LOW (ref 34.0–46.6)
Hemoglobin: 8.7 g/dL — ABNORMAL LOW (ref 11.1–15.9)
Hepatitis B Surface Ag: NEGATIVE
MCH: 29.7 pg (ref 26.6–33.0)
MCHC: 33.5 g/dL (ref 31.5–35.7)
MCV: 89 fL (ref 79–97)
Platelets: 358 10*3/uL (ref 150–450)
RBC: 2.93 x10E6/uL — ABNORMAL LOW (ref 3.77–5.28)
RDW: 11.8 % (ref 11.7–15.4)
RPR Ser Ql: NONREACTIVE
Rh Factor: POSITIVE
Rubella Antibodies, IGG: 18.3 index (ref >0.99)
Varicella zoster IgG: 236 index (ref >165)
WBC: 7.8 10*3/uL (ref 3.4–10.8)

## 2019-10-27 LAB — HEMOGLOBIN A1C
Est. average glucose Bld gHb Est-mCnc: 212 mg/dL
Hgb A1c MFr Bld: 9 % — ABNORMAL HIGH (ref 4.8–5.6)

## 2019-10-27 LAB — HEPATITIS C ANTIBODY: Hep C Virus Ab: <0.1 s/co ratio (ref 0.0–0.9)

## 2019-10-27 NOTE — Patient Instructions (Signed)
Medication Instructions:  No changes  *If you need a refill on your cardiac medications before your next appointment, please call your pharmacy*  Lab Work: None  If you have labs (blood work) drawn today and your tests are completely normal, you will receive your results only by: Marland Kitchen MyChart Message (if you have MyChart) OR . A paper copy in the mail If you have any lab test that is abnormal or we need to change your treatment, we will call you to review the results.  Testing/Procedures: Your physician has requested that you have an echocardiogram. Echocardiography is a painless test that uses sound waves to create images of your heart. It provides your doctor with information about the size and shape of your heart and how well your heart's chambers and valves are working. This procedure takes approximately one hour. There are no restrictions for this procedure.    Follow-Up: At [facility], you and your health needs are our priority.  As part of our continuing mission to provide you with exceptional heart care, we have created designated Provider Care Teams.  These Care Teams include your primary Cardiologist (physician) and Advanced Practice Providers (APPs -  Physician Assistants and Nurse Practitioners) who all work together to provide you with the care you need, when you need it.  Your next appointment:   As needed. We will call you with results and follow up if needed.

## 2019-10-27 NOTE — Progress Notes (Signed)
Cardiology Office Note:    Date:  10/27/2019   ID:  ADALAE KAAI, DOB September 02, 1996, MRN [id number]  PCP:  [facility]  Cardiologist:  No primary care provider on file.  Electrophysiologist:  None   Referring MD: Homero Fellers, *   Chief Complaint  Patient presents with  . New Patient (Initial Visit)    Ref by Adrian Prows, MD for evaluation of heart murmur and is [redacted] weeks pregnant. Pt. c/o a cough for 3 months. Meds reviewed by the pt. verbally.    Emma Thomas is a 23 y.o. female who is being seen today for the evaluation of cardiac murmur at the request of Gilman Schmidt, Christanna R, *.   History of Present Illness:    Emma Thomas is a 23 y.o. female with a hx of type 1 diabetes, [redacted] weeks pregnant who presents due to cardiac murmur.  She denies any history of heart disease or any prior known murmurs.  She saw her OB/GYN for regular visit and was told she has a murmur.  She denies any symptoms of chest pain shortness of breath.  Denies alcohol or smoking.  Past Medical History:  Diagnosis Date  . Diabetes mellitus without complication (HCC)    Type 1 DM  . Essential hypertension   . Hypertension 03/04/2013  . Neurologic disorder    Both feet    Past Surgical History:  Procedure Laterality Date  . abscess removal     excision of bartholin cyst  . Nexplanon  01/2011    Current Medications: Current Meds  Medication Sig  . HUMALOG KWIKPEN 100 UNIT/ML KwikPen   . Insulin Pen Needle (FIFTY50 PEN NEEDLES) 31G X 8 MM MISC as directed USE TO INJECT INSULIN 4 TO 6 TIMES PER DAY  . LANTUS SOLOSTAR 100 UNIT/ML Solostar Pen   . ONETOUCH ULTRA test strip   . SUMAtriptan (IMITREX) 50 MG tablet Take 1 tablet by mouth as needed.     Allergies:   [name].   Social History   Socioeconomic History  . Marital status: Single    Spouse name: Not on file  . Number of children: Not on file  . Years of education: Not on file    . Highest education level: Not on file  Occupational History  . Not on file  Tobacco Use  . Smoking status: Never Smoker  . Smokeless tobacco: Never Used  Substance and Sexual Activity  . Alcohol use: Not Currently    Alcohol/week: 2.0 standard drinks    Types: 2 Shots of liquor per week  . Drug use: No  . Sexual activity: Yes    Birth control/protection: None  Other Topics Concern  . Not on file  Social History Narrative  . Not on file   Social Determinants of Health   Financial Resource Strain:   . Difficulty of Paying Living Expenses: Not on file  Food Insecurity:   . Worried About Charity fundraiser in the Last Year: Not on file  . Ran Out of Food in the Last Year: Not on file  Transportation Needs:   . Lack of Transportation (Medical): Not on file  . Lack of Transportation (Non-Medical): Not on file  Physical Activity:   . Days of Exercise per Week: Not on file  . Minutes of Exercise per Session: Not on file  Stress:   . Feeling of Stress : Not on file  Social Connections:   .  Frequency of Communication with Friends and Family: Not on file  . Frequency of Social Gatherings with Friends and Family: Not on file  . Attends Religious Services: Not on file  . Active Member of Clubs or Organizations: Not on file  . Attends Archivist Meetings: Not on file  . Marital Status: Not on file     Family History: The patient's family history includes Breast cancer (age of onset: 18) in her mother; Diabetes type II in her paternal grandmother; Lung cancer in her maternal grandmother.  ROS:   Please see the history of present illness.     All other systems reviewed and are negative.  EKGs/Labs/Other Studies Reviewed:    The following studies were reviewed today:   EKG:  EKG is  ordered today.  The ekg ordered today demonstrates sinus rhythm, normal ECG  Recent Labs: 07/30/2019: ALT 20 09/30/2019: BUN 29; Creatinine, Ser 1.83; Hemoglobin 10.8; Platelets 401;  Potassium 4.1; Sodium 130  Recent Lipid Panel No results found for: CHOL, TRIG, HDL, CHOLHDL, VLDL, LDLCALC, LDLDIRECT  Physical Exam:    VS:  BP 120/76 (BP Location: Right Arm, Patient Position: Sitting, Cuff Size: Normal)   Pulse 92   Temp (!) 97.3 F (36.3 C)   Ht 5\' 6"  (1.676 m)   Wt 144 lb 8 oz (65.5 kg)   LMP 06/23/2019 (Exact Date)   SpO2 97%   BMI 23.32 kg/m     Wt Readings from Last 3 Encounters:  10/27/19 144 lb 8 oz (65.5 kg)  10/25/19 145 lb (65.8 kg)  09/30/19 150 lb (68 kg)     GEN:  Well nourished, well developed in no acute distress HEENT: Normal NECK: No JVD; No carotid bruits LYMPHATICS: No lymphadenopathy CARDIAC: RRR, faint 1/6 systolic murmur at the apex, rubs, gallops RESPIRATORY:  Clear to auscultation without rales, wheezing or rhonchi  ABDOMEN: Soft, non-tender, non-distended MUSCULOSKELETAL:  No edema; No deformity  SKIN: Warm and dry NEUROLOGIC:  Alert and oriented x 3 PSYCHIATRIC:  Normal affect   ASSESSMENT:   Pregnant patient with a faint systolic murmur at the apex.  Symptoms are likely secondary to fluid/volume increase.  Trivial MR is a likely etiology. 1. Murmur, cardiac    PLAN:    In order of problems listed above:  1. Obtain echocardiogram.  We will call patient with results of test.  Further recommendations pending findings on echocardiogram.    Medication Adjustments/Labs and Tests Ordered: Current medicines are reviewed at length with the patient today.  Concerns regarding medicines are outlined above.  Orders Placed This Encounter  Procedures  . EKG 12-Lead  . ECHOCARDIOGRAM COMPLETE   No orders of the defined types were placed in this encounter.   Patient Instructions  Medication Instructions:  No changes  *If you need a refill on your cardiac medications before your next appointment, please call your pharmacy*  Lab Work: None  If you have labs (blood work) drawn today and your tests are completely normal, you  will receive your results only by: Marland Kitchen MyChart Message (if you have MyChart) OR . A paper copy in the mail If you have any lab test that is abnormal or we need to change your treatment, we will call you to review the results.  Testing/Procedures: Your physician has requested that you have an echocardiogram. Echocardiography is a painless test that uses sound waves to create images of your heart. It provides your doctor with information about the size and shape of your  heart and how well your heart's chambers and valves are working. This procedure takes approximately one hour. There are no restrictions for this procedure.    Follow-Up: At [facility], you and your health needs are our priority.  As part of our continuing mission to provide you with exceptional heart care, we have created designated Provider Care Teams.  These Care Teams include your primary Cardiologist (physician) and Advanced Practice Providers (APPs -  Physician Assistants and Nurse Practitioners) who all work together to provide you with the care you need, when you need it.  Your next appointment:   As needed. We will call you with results and follow up if needed.     Signed, Kate Sable, MD  10/27/2019 2:48 PM    [facility]

## 2019-10-28 LAB — URINE CULTURE

## 2019-10-30 LAB — CYTOLOGY - PAP
Chlamydia: NEGATIVE
Comment: NEGATIVE
Comment: NEGATIVE
Comment: NORMAL
Diagnosis: NEGATIVE
Neisseria Gonorrhea: NEGATIVE
Trichomonas: NEGATIVE

## 2019-10-31 NOTE — Progress Notes (Signed)
Called patient- went straight to voicemail. Left message for her to return the call. Will need review of labs and treatment for UTI/BV. Mychart not set up at this time.

## 2019-11-01 ENCOUNTER — Other Ambulatory Visit: Payer: Self-pay | Admitting: Obstetrics and Gynecology

## 2019-11-01 ENCOUNTER — Other Ambulatory Visit: Payer: Self-pay

## 2019-11-01 ENCOUNTER — Inpatient Hospital Stay
Admission: AD | Admit: 2019-11-01 | Discharge: 2019-11-02 | DRG: 831 | Disposition: A | Discharge status: Other Acute Inpatient Hospital | Payer: BC Managed Care – PPO | Source: Ambulatory Visit | Attending: Obstetrics and Gynecology | Admitting: Obstetrics and Gynecology

## 2019-11-01 ENCOUNTER — Telehealth: Payer: Self-pay | Admitting: Obstetrics and Gynecology

## 2019-11-01 ENCOUNTER — Encounter: Payer: Self-pay | Admitting: Obstetrics and Gynecology

## 2019-11-01 ENCOUNTER — Other Ambulatory Visit: Payer: Self-pay | Admitting: Certified Nurse Midwife

## 2019-11-01 DIAGNOSIS — I129 Hypertensive chronic kidney disease with stage 1 through stage 4 chronic kidney disease, or unspecified chronic kidney disease: Secondary | ICD-10-CM | POA: Diagnosis present

## 2019-11-01 DIAGNOSIS — D649 Anemia, unspecified: Secondary | ICD-10-CM | POA: Diagnosis present

## 2019-11-01 DIAGNOSIS — Z794 Long term (current) use of insulin: Secondary | ICD-10-CM

## 2019-11-01 DIAGNOSIS — O10011 Pre-existing essential hypertension complicating pregnancy, first trimester: Secondary | ICD-10-CM | POA: Diagnosis present

## 2019-11-01 DIAGNOSIS — Z20828 Contact with and (suspected) exposure to other viral communicable diseases: Secondary | ICD-10-CM | POA: Diagnosis present

## 2019-11-01 DIAGNOSIS — E1010 Type 1 diabetes mellitus with ketoacidosis without coma: Secondary | ICD-10-CM | POA: Diagnosis present

## 2019-11-01 DIAGNOSIS — O0990 Supervision of high risk pregnancy, unspecified, unspecified trimester: Secondary | ICD-10-CM

## 2019-11-01 DIAGNOSIS — N10 Acute tubulo-interstitial nephritis: Secondary | ICD-10-CM

## 2019-11-01 DIAGNOSIS — O2301 Infections of kidney in pregnancy, first trimester: Secondary | ICD-10-CM | POA: Diagnosis present

## 2019-11-01 DIAGNOSIS — O99011 Anemia complicating pregnancy, first trimester: Secondary | ICD-10-CM | POA: Diagnosis present

## 2019-11-01 DIAGNOSIS — O24011 Pre-existing diabetes mellitus, type 1, in pregnancy, first trimester: Secondary | ICD-10-CM | POA: Diagnosis present

## 2019-11-01 DIAGNOSIS — E1022 Type 1 diabetes mellitus with diabetic chronic kidney disease: Secondary | ICD-10-CM | POA: Diagnosis present

## 2019-11-01 DIAGNOSIS — N12 Tubulo-interstitial nephritis, not specified as acute or chronic: Secondary | ICD-10-CM

## 2019-11-01 DIAGNOSIS — O26831 Pregnancy related renal disease, first trimester: Secondary | ICD-10-CM | POA: Diagnosis present

## 2019-11-01 DIAGNOSIS — Z3A12 12 weeks gestation of pregnancy: Secondary | ICD-10-CM | POA: Diagnosis not present

## 2019-11-01 DIAGNOSIS — O2341 Unspecified infection of urinary tract in pregnancy, first trimester: Secondary | ICD-10-CM | POA: Diagnosis present

## 2019-11-01 HISTORY — DX: Urinary tract infection, site not specified: N39.0

## 2019-11-01 LAB — GLUCOSE, CAPILLARY
Glucose-Capillary: 163 mg/dL — ABNORMAL HIGH (ref 70–99)
Glucose-Capillary: 173 mg/dL — ABNORMAL HIGH (ref 70–99)
Glucose-Capillary: 179 mg/dL — ABNORMAL HIGH (ref 70–99)
Glucose-Capillary: 193 mg/dL — ABNORMAL HIGH (ref 70–99)
Glucose-Capillary: 210 mg/dL — ABNORMAL HIGH (ref 70–99)
Glucose-Capillary: 210 mg/dL — ABNORMAL HIGH (ref 70–99)
Glucose-Capillary: 218 mg/dL — ABNORMAL HIGH (ref 70–99)
Glucose-Capillary: 231 mg/dL — ABNORMAL HIGH (ref 70–99)
Glucose-Capillary: 246 mg/dL — ABNORMAL HIGH (ref 70–99)
Glucose-Capillary: 304 mg/dL — ABNORMAL HIGH (ref 70–99)
Glucose-Capillary: 359 mg/dL — ABNORMAL HIGH (ref 70–99)
Glucose-Capillary: 372 mg/dL — ABNORMAL HIGH (ref 70–99)
Glucose-Capillary: 380 mg/dL — ABNORMAL HIGH (ref 70–99)

## 2019-11-01 LAB — BASIC METABOLIC PANEL
Anion gap: 14 (ref 5–15)
Anion gap: 9 (ref 5–15)
BUN: 30 mg/dL — ABNORMAL HIGH (ref 6–20)
BUN: 32 mg/dL — ABNORMAL HIGH (ref 6–20)
CO2: 15 mmol/L — ABNORMAL LOW (ref 22–32)
CO2: 20 mmol/L — ABNORMAL LOW (ref 22–32)
Calcium: 8.5 mg/dL — ABNORMAL LOW (ref 8.9–10.3)
Calcium: 8.4 mg/dL — ABNORMAL LOW (ref 8.9–10.3)
Chloride: 98 mmol/L (ref 98–111)
Chloride: 99 mmol/L (ref 98–111)
Creatinine, Ser: 1.94 mg/dL — ABNORMAL HIGH (ref 0.44–1.00)
Creatinine, Ser: 1.97 mg/dL — ABNORMAL HIGH (ref 0.44–1.00)
GFR calc Af Amer: 41 mL/min — ABNORMAL LOW (ref >60)
GFR calc Af Amer: 40 mL/min — ABNORMAL LOW (ref >60)
GFR calc non Af Amer: 35 mL/min — ABNORMAL LOW (ref >60)
GFR calc non Af Amer: 34 mL/min — ABNORMAL LOW (ref >60)
Glucose, Bld: 406 mg/dL — ABNORMAL HIGH (ref 70–99)
Glucose, Bld: 190 mg/dL — ABNORMAL HIGH (ref 70–99)
Potassium: 4.9 mmol/L (ref 3.5–5.1)
Potassium: 4.3 mmol/L (ref 3.5–5.1)
Sodium: 127 mmol/L — ABNORMAL LOW (ref 135–145)
Sodium: 128 mmol/L — ABNORMAL LOW (ref 135–145)

## 2019-11-01 LAB — IRON AND TIBC
Iron: 30 ug/dL (ref 28–170)
Saturation Ratios: 15 % (ref 10.4–31.8)
TIBC: 200 ug/dL — ABNORMAL LOW (ref 250–450)
UIBC: 170 ug/dL

## 2019-11-01 LAB — CBC
HCT: 22.2 % — ABNORMAL LOW (ref 36.0–46.0)
Hemoglobin: 7.6 g/dL — ABNORMAL LOW (ref 12.0–15.0)
MCH: 29.6 pg (ref 26.0–34.0)
MCHC: 34.2 g/dL (ref 30.0–36.0)
MCV: 86.4 fL (ref 80.0–100.0)
Platelets: 374 10*3/uL (ref 150–400)
RBC: 2.57 MIL/uL — ABNORMAL LOW (ref 3.87–5.11)
RDW: 11.3 % — ABNORMAL LOW (ref 11.5–15.5)
WBC: 8.2 10*3/uL (ref 4.0–10.5)
nRBC: 0 % (ref 0.0–0.2)

## 2019-11-01 LAB — MAGNESIUM: Magnesium: 2 mg/dL (ref 1.7–2.4)

## 2019-11-01 LAB — PHOSPHORUS: Phosphorus: 5.2 mg/dL — ABNORMAL HIGH (ref 2.5–4.6)

## 2019-11-01 LAB — FERRITIN: Ferritin: 336 ng/mL — ABNORMAL HIGH (ref 11–307)

## 2019-11-01 LAB — CREATININE, URINE, RANDOM: Creatinine, Urine: 88 mg/dL

## 2019-11-01 LAB — SODIUM, URINE, RANDOM: Sodium, Ur: 49 mmol/L

## 2019-11-01 LAB — HIV ANTIBODY (ROUTINE TESTING W REFLEX): HIV Screen 4th Generation wRfx: NONREACTIVE

## 2019-11-01 LAB — LACTIC ACID, PLASMA: Lactic Acid, Venous: 1.1 mmol/L (ref 0.5–1.9)

## 2019-11-01 LAB — VITAMIN B12: Vitamin B-12: 376 pg/mL (ref 180–914)

## 2019-11-01 MED ORDER — ONDANSETRON HCL 4 MG/2ML IJ SOLN: 4.0000 mg | Freq: Four times a day (QID) | INTRAMUSCULAR | Status: DC | PRN

## 2019-11-01 MED ORDER — SODIUM CHLORIDE 0.9 % IV SOLN: INTRAVENOUS | Status: DC

## 2019-11-01 MED ORDER — DEXTROSE 50 % IV SOLN: 0.0000 mL | INTRAVENOUS | Status: DC | PRN

## 2019-11-01 MED ORDER — INSULIN ASPART 100 UNIT/ML ~~LOC~~ SOLN
0.0000 [IU] | Freq: Three times a day (TID) | SUBCUTANEOUS | Status: DC
  Administered 2019-11-01: 15:00:00 9 [IU] via SUBCUTANEOUS
  Filled 2019-11-01: qty 1

## 2019-11-01 MED ORDER — INSULIN REGULAR(HUMAN) IN NACL 100-0.9 UT/100ML-% IV SOLN
INTRAVENOUS | Status: DC
  Administered 2019-11-01: 16:00:00 7 [IU]/h via INTRAVENOUS
  Administered 2019-11-02: 11:00:00 1.8 [IU]/h via INTRAVENOUS
  Filled 2019-11-01: qty 100

## 2019-11-01 MED ORDER — FERROUS SULFATE 325 (65 FE) MG PO TABS
325.0000 mg | ORAL_TABLET | Freq: Three times a day (TID) | ORAL | Status: DC
  Administered 2019-11-02: 10:00:00 325 mg via ORAL
  Filled 2019-11-01: qty 1

## 2019-11-01 MED ORDER — SODIUM CHLORIDE 0.9 % IV SOLN
1.0000 g | INTRAVENOUS | Status: DC
  Administered 2019-11-01: 15:00:00 1 g via INTRAVENOUS
  Filled 2019-11-01: qty 10
  Filled 2019-11-01: qty 1

## 2019-11-01 MED ORDER — ACETAMINOPHEN 500 MG PO TABS
ORAL_TABLET | ORAL | Status: AC
  Administered 2019-11-01: 18:00:00 1000 mg via ORAL
  Filled 2019-11-01: qty 2

## 2019-11-01 MED ORDER — ADULT MULTIVITAMIN W/MINERALS CH
1.0000 | ORAL_TABLET | Freq: Every day | ORAL | Status: DC
  Administered 2019-11-02: 10:00:00 1 via ORAL
  Filled 2019-11-01: qty 1

## 2019-11-01 MED ORDER — DEXTROSE-NACL 5-0.45 % IV SOLN: INTRAVENOUS | Status: DC

## 2019-11-01 MED ORDER — ACETAMINOPHEN 500 MG PO TABS: 1000.0000 mg | ORAL_TABLET | Freq: Four times a day (QID) | ORAL | Status: DC | PRN

## 2019-11-01 MED ORDER — ONDANSETRON HCL 4 MG PO TABS: 4.0000 mg | ORAL_TABLET | Freq: Four times a day (QID) | ORAL | Status: DC | PRN

## 2019-11-01 MED ORDER — DOCUSATE SODIUM 100 MG PO CAPS
100.0000 mg | ORAL_CAPSULE | Freq: Two times a day (BID) | ORAL | Status: DC
  Administered 2019-11-01 – 2019-11-02 (×2): 100 mg via ORAL
  Filled 2019-11-01 (×2): qty 1

## 2019-11-01 NOTE — Progress Notes (Signed)
Pt states she went to [facility] 10/31/2019 and got rapid COVID-19 and flu- both were negative per pt. Carmon Ginsberg., CNM aware.

## 2019-11-01 NOTE — Consult Note (Signed)
[facility] Initial Consultation Note   Patient Name: Emma Thomas    [id number] PCP: [facility]     DOB: [birth year]  DOA: 11/01/2019 DOS: the patient was seen and examined on 11/01/2019   Referring physician: Dr. Leo Grosser Reason for consult: Medical management  [id number] is a 23 y.o. G1P0 female with EDC=05/10/2020 at [redacted]w[redacted]d admitted to the [facility] unit with suspected UTI.  Following admission patient also was found to be in DKA, hyponatremic AKI versus AKI on CKD.  Hospitalist service was consulted for further medical management.  Patient states she has been having some fever at home.  She was also feeling tired and fatigued.  However she denies having any dysuria, hematuria or pyuria.  Has some associated flank pain.  Patient denies any chest pain, shortness of breath, cough, focal neurological deficit.  She does acknowledge having some headache.  She used to take sumatriptan prior to pregnancy but has not been taking it since her conception. She had a negative Covid and flu test at the [facility] urgent care yesterday per record.   Patient is coming from Home   Review of Systems: as mentioned in the history of present illness.  All other systems reviewed and are negative.  Past Medical History:  Diagnosis Date  . Diabetes mellitus without complication (HCC)    Type 1 DM  . Essential hypertension   . Hypertension 03/04/2013  . Neurologic disorder    Both feet  . Recurrent UTI    Past Surgical History:  Procedure Laterality Date  . abscess removal     excision of bartholin cyst  . Nexplanon  01/2011   Social History:  reports that she has never smoked. She has never used smokeless tobacco. She reports previous alcohol use of about 2.0 standard drinks of alcohol per week. She reports that she does not use drugs.  No Known Allergies  Family history reviewed and not pertinent Family History  Problem Relation Age of Onset  . Breast cancer Mother 49  . Lung  cancer Maternal Grandmother   . Diabetes type II Paternal Grandmother     Prior to Admission medications   Medication Sig Start Date End Date Taking? Authorizing Provider  HUMALOG KWIKPEN 100 UNIT/ML KwikPen  08/02/19  Yes [provider]  Insulin Pen Needle (FIFTY50 PEN NEEDLES) 31G X 8 MM MISC as directed USE TO INJECT INSULIN 4 TO 6 TIMES PER DAY 09/29/18  Yes [provider]  LANTUS SOLOSTAR 100 UNIT/ML Solostar Pen  09/06/19  Yes [provider]  ONETOUCH ULTRA test strip  08/02/19  Yes [provider]  SUMAtriptan (IMITREX) 50 MG tablet Take 1 tablet by mouth as needed. 06/11/17  Yes [provider]    Physical Exam: Vitals:   11/01/19 1311 11/01/19 1409 11/01/19 1759  BP: 115/77  121/72  Pulse: 98  98  Resp: 20  18  Temp: 98.5 F (36.9 C)  98.4 F (36.9 C)  TempSrc: Axillary  Oral  SpO2: 99%    Weight:  67.1 kg   Height:  5\' 6"  (1.676 m)    Constitutional: Was sleeping in her room.  Appears to be tired and sleepy.  Responds to questions appropriately. Eyes: Conjunctivae are normal. PERRL. EOMI. Head: Atraumatic. ENT:      Ears:       Nose: No congestion/rhinnorhea.      Mouth/Throat: Mucous membranes are moist.  Neck: No stridor.    Cardiovascular: Mildly tachycardic, regular rhythm. Normal S1  and S2.  Good peripheral circulation. Respiratory: Normal respiratory effort without tachypnea or retractions. Lungs CTAB. Good air entry to the bases with no decreased or absent breath sounds. Gastrointestinal:  Gravid; Bowel sounds 4 quadrants. Soft and nontender to palpation all quadrants and suprapubic region.. No guarding or rigidity. No palpable masses. No distention.  Questionable CVA tenderness. Musculoskeletal: Full range of motion to all extremities. No gross deformities appreciated. Neurologic:  Normal speech and language. No gross focal neurologic deficits are appreciated.  Skin:  Skin is warm, dry and intact. No rash  noted. Psychiatric: Mood and affect are normal. Speech and behavior are normal. Patient exhibits appropriate insight and judgement.  Labs:  CBC: Recent Labs  Lab 11/01/19 1629  WBC 8.2  HGB 7.6*  HCT 22.2*  MCV 86.4  PLT [id number]   Basic Metabolic Panel: Recent Labs  Lab 11/01/19 1430  NA 127*  K 4.9  CL 98  CO2 15*  GLUCOSE 406*  BUN 30*  CREATININE 1.94*  CALCIUM 8.5*  MG 2.0  PHOS 5.2*   Liver Function Tests: No results for input(s): AST, ALT, ALKPHOS, BILITOT, PROT, ALBUMIN in the last 168 hours. No results for input(s): LIPASE, AMYLASE in the last 168 hours. No results for input(s): AMMONIA in the last 168 hours.  Cardiac Enzymes: No results for input(s): CKTOTAL, CKMB, CKMBINDEX, TROPONINI in the last 168 hours. No results for input(s): PROBNP in the last 8760 hours.  CBG: Recent Labs  Lab 11/01/19 1536 11/01/19 1608 11/01/19 1641 11/01/19 1722 11/01/19 1756  GLUCAP 372* 359* 304* 246* 193*    Radiological Exams: No results found.  EKG: Independently reviewed.  EKG from 10/27/2019 shows normal sinus rhythm with ventricular rate of 90.  Assessment/Plan Active Problems:   Pyelonephritis   Supervision of high risk pregnancy, antepartum    1.  Diabetes mellitus type 1 with uncontrolled hyperglycemia: Likely was in DKA initially -Patient blood glucose was in 400s -  patient is currently on insulin drip -Per nursing staffs her blood glucose came down below 250s - Recommend to follow the DKA protocol strictly; and start long-acting insulin along with sliding scale per protocol -Continue IV hydration -Check BMP every 6-8 hours; monitor electrolytes -Resume diet as per protocol  2.  UTI with E. coli: Based on urine culture from 10/25/2019 -Patient is currently on Rocephin -May check renal ultrasound for suspected pyelonephritis -Based on ultrasound report may consult urology -Blood culture x2 -New IV hydration  3.  AKI on CKD: Patient's creatinine  was normal 3 months ago with GFR also within normal limit -However with this admission her creatinine found to be 1.94.  A week ago it was 1.37 and a month ago 1.83 GFR being 44 -Unclear etiology.  Infection versus dehydration -May check renal ultrasound -May check urine electrolytes -Continue IV hydration -If no improvement may consult nephrology -Avoid nephrotoxins -Monitor renal function  4.  Dehydration and electrolyte imbalance: Patient is likely to be dehydrated due to DKA with excessive voiding -Sodium is 127 white potassium is 4.9 likely from dehydration -Continue IV hydration -Check BMP every 6-8 hours for now -Place electrolytes as needed  5.  Anemia: Likely from iron deficiency versus anemia of chronic disease versus pregnancy -hemoGlobin is 7.6 on this admission about 3 months ago -Patient may be started on p.o. iron -Check FOBT -Commend transfusion given patient is slightly tachycardic  6.  OB: Per primary team  Prophylaxis with SCDs bilaterally   Family Communication: Raquel Sarna Primary team communication: Spoke  with midwife and discussed the plan  Thank you very much for involving [place] in care of your patient.  We will continue to follow the patient.    Author: Thornell Mule, MD [facility] 11/01/2019 6:37 PM    If 7PM-7AM, please contact night-coverage [email or web address]

## 2019-11-01 NOTE — Telephone Encounter (Signed)
Called patient- will admit to hospital

## 2019-11-01 NOTE — Plan of Care (Signed)
Blood sugars slowly coming more under control. Patient is very aware of her diabetes and how it is managed.

## 2019-11-01 NOTE — H&P (Signed)
OB History & Physical   History of Present Illness:  Chief Complaint:   HPI:  Emma Thomas is a 23 y.o. G1P0 female with EDC=05/10/2020 at [redacted]w[redacted]d dated by a 11wk5d ultrasound.  She has Type 1 IDDM which was diagnosed 2007. Her medical history is also remarkable for depression, hypertension, migraine headaches, past history of pyelonephritis, gonorrhea, Chlamydia, sepsis and multiple episodes of DKA (last episode Oct 2019).  She was a direct admit to the hospital today with a UTI, possibly pyelonephritis. She had a + urine culture for > 100K E. Coli.10/25/2019 sensitive to all except Ampicillin.  When she was called today regarding her lab result, she complained of left flank/ back pain and fever. She had a fever of 100.4 on Friday and actually had a negative Covid and flu  test at the [facility] urgent care yesterday. She denies dysuria. Has a history of frequent UTIs. Was treated in November 14 by ER physician for UTI with Keflex.  Of note her PC ratio last week was 5,757 mgm protein, creatinine was 1.37 mg/dl and her hemoglobin A1C was 9%. She usually takes 18units of Lantus at night and Humalog with her meals. Her initial CBG after admission was 380  Prenatal care site: She just began her prenatal care at [facility] last week and her labs are as follows  [facility] Prenatal Labs  Dating 11wk5d [place] Blood type: O/Positive/-- (12/09 1022)   Genetic Screen 1 Screen:    AFP:     Quad:     NIPS: Antibody:Negative (12/09 1022)  Anatomic [place]  Rubella: 18.30 (12/09 1022) Varicella:Immune  GTT Early:               Third trimester:  RPR: Non Reactive (12/09 1022)   Rhogam  HBsAg: Negative (12/09 1022)   TDaP vaccine                       Flu Shot: HIV: Non Reactive (12/09 1022)   Baby Food                                GBS:   Contraception  Pap:  CBB     CS/VBAC    Support Person         Maternal Medical History:   Past Medical History:  Diagnosis Date  . Diabetes mellitus without  complication (HCC)    Type 1 DM  . Essential hypertension   . Hypertension 03/04/2013  . Neurologic disorder    Both feet    Past Surgical History:  Procedure Laterality Date  . abscess removal     excision of bartholin cyst  . Nexplanon  01/2011    No Known Allergies  Prior to Admission medications   Medication Sig Start Date End Date Taking? Authorizing Provider  HUMALOG KWIKPEN 100 UNIT/ML KwikPen  08/02/19   [provider]  Insulin Pen Needle (FIFTY50 PEN NEEDLES) 31G X 8 MM MISC as directed USE TO INJECT INSULIN 4 TO 6 TIMES PER DAY 09/29/18   [provider]  LANTUS SOLOSTAR 100 UNIT/ML Solostar Pen  09/06/19   [provider]  ONETOUCH ULTRA test strip  08/02/19   [provider]  SUMAtriptan (IMITREX) 50 MG tablet Take 1 tablet by mouth as needed. 06/11/17   [provider]          Social History: She  reports that she has  never smoked. She has never used smokeless tobacco. She reports previous alcohol use of about 2.0 standard drinks of alcohol per week. She reports that she does not use drugs.  Family History: family history includes Breast cancer (age of onset: 77) in her mother; Diabetes type II in her paternal grandmother; Lung cancer in her maternal grandmother.   Review of Systems: Negative x 10 systems reviewed except as noted in the HPI.      Physical Exam:  Vital Signs: BP 115/77 (BP Location: Right Arm)   Pulse 98   Temp 98.5 F (36.9 C) (Axillary)   Resp 20   LMP 06/23/2019 (Exact Date)   SpO2 99%  General: BF curled in a ball, appears uncomfortable  HEENT: normocephalic, atraumatic Heart: regular rate & rhythm.  No murmurs/rubs/gallops Lungs: clear to auscultation bilaterally Abdomen: soft, gravid, non-tender. Unable to hear FHTs with DT. ON ultrasound there was a SIUP with FCA of 157 BPM. +FM noted Back: NO CVAT noted Extremities: non-tender, symmetric, no edema bilaterally.  Neurologic: Alert &  oriented x 3.   Psyche: somewhat flat affect, answering questions appropriately  Results for orders placed or performed during the hospital encounter of 11/01/19 (from the past 24 hour(s))  Glucose, capillary     Status: Abnormal   Collection Time: 11/01/19  2:09 PM  Result Value Ref Range   Glucose-Capillary 380 (H) 70 - 99 mg/dL   Comment 1 Notify RN    Comment 2 Document in Chart   Basic metabolic panel     Status: Abnormal   Collection Time: 11/01/19  2:30 PM  Result Value Ref Range   Sodium 127 (L) 135 - 145 mmol/L   Potassium 4.9 3.5 - 5.1 mmol/L   Chloride 98 98 - 111 mmol/L   CO2 15 (L) 22 - 32 mmol/L   Glucose, Bld 406 (H) 70 - 99 mg/dL   BUN 30 (H) 6 - 20 mg/dL   Creatinine, Ser 1.94 (H) 0.44 - 1.00 mg/dL   Calcium 8.5 (L) 8.9 - 10.3 mg/dL   GFR calc non Af Amer 35 (L) >60 mL/min   GFR calc Af Amer 41 (L) >60 mL/min   Anion gap 14 5 - 15  Magnesium     Status: None   Collection Time: 11/01/19  2:30 PM  Result Value Ref Range   Magnesium 2.0 1.7 - 2.4 mg/dL  Phosphorus     Status: Abnormal   Collection Time: 11/01/19  2:30 PM  Result Value Ref Range   Phosphorus 5.2 (H) 2.5 - 4.6 mg/dL  Ferritin     Status: Abnormal   Collection Time: 11/01/19  2:30 PM  Result Value Ref Range   Ferritin 336 (H) 11 - 307 ng/mL  Iron and TIBC     Status: Abnormal   Collection Time: 11/01/19  2:30 PM  Result Value Ref Range   Iron 30 28 - 170 ug/dL   TIBC 200 (L) 250 - 450 ug/dL   Saturation Ratios 15 10.4 - 31.8 %   UIBC 170 ug/dL  Lactic acid, plasma     Status: None   Collection Time: 11/01/19  2:30 PM  Result Value Ref Range   Lactic Acid, Venous 1.1 0.5 - 1.9 mmol/L  Glucose, capillary     Status: Abnormal   Collection Time: 11/01/19  3:36 PM  Result Value Ref Range   Glucose-Capillary 372 (H) 70 - 99 mg/dL  Glucose, capillary     Status: Abnormal   Collection Time:  11/01/19  4:08 PM  Result Value Ref Range   Glucose-Capillary 359 (H) 70 - 99 mg/dL  CBC     Status:  Abnormal   Collection Time: 11/01/19  4:29 PM  Result Value Ref Range   WBC 8.2 4.0 - 10.5 K/uL   RBC 2.57 (L) 3.87 - 5.11 MIL/uL   Hemoglobin 7.6 (L) 12.0 - 15.0 g/dL   HCT 22.2 (L) 36.0 - 46.0 %   MCV 86.4 80.0 - 100.0 fL   MCH 29.6 26.0 - 34.0 pg   MCHC 34.2 30.0 - 36.0 g/dL   RDW 11.3 (L) 11.5 - 15.5 %   Platelets 374 150 - 400 K/uL   nRBC 0.0 0.0 - 0.2 %  Glucose, capillary     Status: Abnormal   Collection Time: 11/01/19  4:41 PM  Result Value Ref Range   Glucose-Capillary 304 (H) 70 - 99 mg/dL  Glucose, capillary     Status: Abnormal   Collection Time: 11/01/19  5:22 PM  Result Value Ref Range   Glucose-Capillary 246 (H) 70 - 99 mg/dL  Glucose, capillary     Status: Abnormal   Collection Time: 11/01/19  5:56 PM  Result Value Ref Range   Glucose-Capillary 193 (H) 70 - 99 mg/dL     Assessment/ Plan:  ALYZZA PUCK is a 23 y.o. G1P0 female at [redacted]w[redacted]d with UTI, possible pyelo  Continue Rocephin IV and IV fluids  T1IDDM with uncontrolled blood sugars-has been started on an insulin drip and using the  endo tool. Was transferred to L&D for closer monitoring with endo tool. Last CBG  was below 200. Resume diet  Diabetic educator has been consulted and they will see her in AM  Medicine (hospitalist)  consulted  MFM consult tomorrow Acute kidney injury (creatinine 1.94)/ Hyponatremia/ dehydration-recommendations per  medicine  Check BMP every 6-8 hours Severe Anemia-begin iron, consider transfusion and hematology consult TED stockings for DVT PPX      Dalia Heading  11/01/2019 3:04 PM

## 2019-11-01 NOTE — Telephone Encounter (Signed)
Patient is calling for labs results. Please advise. 

## 2019-11-01 NOTE — Progress Notes (Signed)
Dalia Heading, CNM at bedside, bedside ultrasound FHR calculated at 157 beats/min

## 2019-11-01 NOTE — Progress Notes (Signed)
Inpatient Diabetes Program Recommendations  Diabetes Treatment Program Recommendations  ADA Standards of Care 2020 Diabetes in Pregnancy Target Glucose Ranges:  Fasting: 60 - 90 mg/dL Preprandial: 60 - 105 mg/dL 1 hr postprandial: Less than 140mg /dL (from first bite of meal) 2 hr postprandial: Less than 120 mg/dL (from first bite of meal)    Results for JANALYN, BURNE (MRN [id number]) as of 11/01/2019 15:04  Ref. Range 11/01/2019 14:09  Glucose-Capillary Latest Ref Range: 70 - 99 mg/dL 380 (H)  Results for DEOSHA, MAMMEN (MRN [id number]) as of 11/01/2019 15:04  Ref. Range 11/01/2019 14:30  CO2 Latest Ref Range: 22 - 32 mmol/L 15 (L)  Results for SHANANN, PARRAGA (MRN [id number]) as of 11/01/2019 15:04  Ref. Range 11/01/2019 14:30  Anion gap Latest Ref Range: 5 - 15  14   Results for WHITNEI, BODE (MRN [id number]) as of 11/01/2019 15:04  Ref. Range 10/25/2019 10:22  Hemoglobin A1C Latest Ref Range: 4.8 - 5.6 % 9.0 (H)   Review of Glycemic Control  Diabetes history: DM1 (makes NO insulin; requires basal, correction, and carb coverage insulin) Outpatient Diabetes medications: Lantus 18 units QHS, Humalog with meals based on glucose and carbohydrates Current orders for Inpatient glycemic control: IV insulin   Inpatient Diabetes Program Recommendations:   IV insulin: Noted IV insulin has been ordered and patient is being moved to L&D so that IV insulin can be started.  Initial glucose 380 mg/dl today and patient has received Novolog 9 units at 14:44 today. Per labs today at 14:30, CO2 15 and AG 14.  NOTE: Noted consult for patient with Type 1 DM, 12 weeks 5 days gestation. In reviewing chart, noted patient was dx with DM11 at 23 years old. Noted patient seen Endocrinology (Dr. Solon Augusta) on 09/06/19 and A1C was 11.4% on 09/06/19. Per office note by Dr. Solon Augusta, patient was taking Lantus 18 units daily and Humalog 1 unit for every 10 grams of carbs. Patient had initial prenatal  visit with Dr. Gilman Schmidt on 10/25/19 and patient was encouraged to notify her Endocrinologist that she was pregnant. Patient is going to be started on IV insulin so she is being transferred to L&D (unit that can start and use IV insulin). Agreeable to use IV insulin to improve glucose, clear acidosis, and help determine insulin needs.   Once glucose is controlled, acidosis is cleared, and MD ready to transition to SQ insulin:  Per weight (65.5 kg) and gestational age ([redacted]W[redacted]D) protocol, the following would be the recommended SQ insulin regimen: NPH 7 units BID, Novolog 1 unit for every 10 grams of carbohydrates, and Novolog 0-14 units QID (fasting and 2 hour post prandial) for correction.   Will plan to have Diabetes Coordinator follow up on patient's glucose trends early tomorrow morning. Diabetes Coordinator on call 8am-5pm 7 days a week if needed.    Thanks, Barnie Alderman, RN, MSN, CDE Diabetes Coordinator Inpatient Diabetes Program [phone] (Team Pager from 8am to 5pm)

## 2019-11-01 NOTE — Progress Notes (Signed)
Endocrinology consult placed by Dr. Gilman Schmidt. Endocrinology department called at [facility] [phone])- do not see pt in hospital anymore. Receptionist stated pt no longer is a patient of theirs and will have to follow up at another clinic. Carmon Ginsberg., CNM updated.

## 2019-11-01 NOTE — Telephone Encounter (Signed)
Please advise, I see your previous note about being unable to reach pt. Thank you

## 2019-11-02 ENCOUNTER — Ambulatory Visit: Payer: BLUE CROSS/BLUE SHIELD

## 2019-11-02 ENCOUNTER — Inpatient Hospital Stay: Payer: BC Managed Care – PPO

## 2019-11-02 LAB — GLUCOSE, CAPILLARY
Glucose-Capillary: 101 mg/dL — ABNORMAL HIGH (ref 70–99)
Glucose-Capillary: 115 mg/dL — ABNORMAL HIGH (ref 70–99)
Glucose-Capillary: 117 mg/dL — ABNORMAL HIGH (ref 70–99)
Glucose-Capillary: 117 mg/dL — ABNORMAL HIGH (ref 70–99)
Glucose-Capillary: 123 mg/dL — ABNORMAL HIGH (ref 70–99)
Glucose-Capillary: 125 mg/dL — ABNORMAL HIGH (ref 70–99)
Glucose-Capillary: 130 mg/dL — ABNORMAL HIGH (ref 70–99)
Glucose-Capillary: 87 mg/dL (ref 70–99)

## 2019-11-02 LAB — BASIC METABOLIC PANEL
Anion gap: 8 (ref 5–15)
BUN: 31 mg/dL — ABNORMAL HIGH (ref 6–20)
CO2: 19 mmol/L — ABNORMAL LOW (ref 22–32)
Calcium: 8.4 mg/dL — ABNORMAL LOW (ref 8.9–10.3)
Chloride: 106 mmol/L (ref 98–111)
Creatinine, Ser: 1.64 mg/dL — ABNORMAL HIGH (ref 0.44–1.00)
GFR calc Af Amer: 50 mL/min — ABNORMAL LOW (ref >60)
GFR calc non Af Amer: 43 mL/min — ABNORMAL LOW (ref >60)
Glucose, Bld: 124 mg/dL — ABNORMAL HIGH (ref 70–99)
Potassium: 4.3 mmol/L (ref 3.5–5.1)
Sodium: 133 mmol/L — ABNORMAL LOW (ref 135–145)

## 2019-11-02 MED ORDER — DEXTROSE-NACL 5-0.45 % IV SOLN: INTRAVENOUS | Status: DC

## 2019-11-02 MED ORDER — BUTALBITAL-APAP-CAFFEINE 50-325-40 MG PO TABS
2.0000 | ORAL_TABLET | Freq: Four times a day (QID) | ORAL | Status: DC | PRN
  Administered 2019-11-02: 10:00:00 2 via ORAL
  Filled 2019-11-02: qty 2

## 2019-11-02 MED ORDER — PRENATAL MULTIVITAMIN CH
ORAL_TABLET | ORAL | Status: AC
  Administered 2019-11-02: 10:00:00 1
  Filled 2019-11-02: qty 1

## 2019-11-02 NOTE — Progress Notes (Signed)
Pt transferred to [facility] Delivery Rm 5813 from [facility] and delivery. Pt transported via [employer]. Pt received discharge summary and education. Pt verbalized understanding. Pt stable upon transport with fluids and Insulin drip running. VSS upon leaving facility. R.Harris, MD and J.Gledhill, CNM aware of pt departure.

## 2019-11-02 NOTE — Progress Notes (Signed)
Patient was concerned about the cost of her visit. I gave her information for how to apply for pregnancy medicaid if she qualifies. According to the patient she does, so I gave her information on pregnancy medicaid and wic.

## 2019-11-02 NOTE — Discharge Summary (Signed)
Physician Final Progress Note  Patient ID: Emma Thomas MRN: [id number] DOB/AGE: 1996/07/08 23 y.o.  Admit date: 11/01/2019 Admitting provider: Homero Fellers, MD Discharge date: 11/02/2019   Admission Diagnoses: UTI possibly pyelonephritis, Type I IDDM with uncontrolled blood sugars, acute kidney injury, severe anemia  Discharge Diagnoses:  Active Problems:   Pyelonephritis   Supervision of high risk pregnancy, antepartum IUP at 12 weeks IDDM Anemia  History of Present Illness: The patient is a 23 y.o. female G1P0 at [redacted]w[redacted]d who presents for direct admit to the hospital today with a UTI, possibly pyelonephritis. She had a + urine culture for > 100K E. Coli.10/25/2019 sensitive to all except Ampicillin.  When she was called today regarding her lab result, she complained of left flank/ back pain and fever. She had a fever of 100.4 on Friday and actually had a negative Covid and flu  test at the [facility] urgent care yesterday. She denies dysuria. Has a history of frequent UTIs. Was treated in November 14 by ER physician for UTI with Keflex.  Of note her PC ratio last week was 5,757 mgm protein, creatinine was 1.37 mg/dl and her hemoglobin A1C was 9%. She usually takes 18units of Lantus at night and Humalog with her meals. Her initial CBG after admission was 380.  EDC=05/10/2020 at [redacted]w[redacted]d dated by a 11wk5d ultrasound.  She has Type 1 IDDM which was diagnosed 2007. Her medical history is also remarkable for depression, hypertension, migraine headaches, past history of pyelonephritis, gonorrhea, Chlamydia, sepsis and multiple episodes of DKA (last episode Oct 2019).  Patient was admitted, labs drawn, antibiotic treatment for pyelonephritis, kidney imaging, Endo Tool initiated for control of elevated blood sugars, IV fluids, diabetic educator, hospitalist (medicine), hematology consulted, iron administered, TED stockings for DVT PPX.   Patient's blood sugar levels improved on Endo Tool. MFM  consulted this morning with discussion regarding transfer of care to [facility] given complicated nature of patient's medical diagnoses. Dr Diamantina Monks agrees with transfer and transfer was initiated and completed by noon today. Dr Laurence Ferrari at [facility] is receiving MD. Transport by [employer].  Past Medical History:  Diagnosis Date  . Diabetes mellitus without complication (HCC)    Type 1 DM  . Essential hypertension   . Hypertension 03/04/2013  . Neurologic disorder    Both feet  . Recurrent UTI     Past Surgical History:  Procedure Laterality Date  . abscess removal     excision of bartholin cyst  . Nexplanon  01/2011    No current facility-administered medications on file prior to encounter.   Current Outpatient Medications on File Prior to Encounter  Medication Sig Dispense Refill  . HUMALOG KWIKPEN 100 UNIT/ML KwikPen     . Insulin Pen Needle (FIFTY50 PEN NEEDLES) 31G X 8 MM MISC as directed USE TO INJECT INSULIN 4 TO 6 TIMES PER DAY    . LANTUS SOLOSTAR 100 UNIT/ML Solostar Pen     . ONETOUCH ULTRA test strip     . SUMAtriptan (IMITREX) 50 MG tablet Take 1 tablet by mouth as needed.      No Known Allergies  Social History   Socioeconomic History  . Marital status: Single    Spouse name: Not on file  . Number of children: Not on file  . Years of education: Not on file  . Highest education level: Not on file  Occupational History  . Not on file  Tobacco Use  . Smoking status: Never Smoker  . Smokeless  tobacco: Never Used  Substance and Sexual Activity  . Alcohol use: Not Currently    Alcohol/week: 2.0 standard drinks    Types: 2 Shots of liquor per week  . Drug use: No  . Sexual activity: Yes    Birth control/protection: None  Other Topics Concern  . Not on file  Social History Narrative  . Not on file   Social Determinants of Health   Financial Resource Strain:   . Difficulty of Paying Living Expenses: Not on file  Food Insecurity:   . Worried About Ship broker in the Last Year: Not on file  . Ran Out of Food in the Last Year: Not on file  Transportation Needs:   . Lack of Transportation (Medical): Not on file  . Lack of Transportation (Non-Medical): Not on file  Physical Activity:   . Days of Exercise per Week: Not on file  . Minutes of Exercise per Session: Not on file  Stress:   . Feeling of Stress : Not on file  Social Connections:   . Frequency of Communication with Friends and Family: Not on file  . Frequency of Social Gatherings with Friends and Family: Not on file  . Attends Religious Services: Not on file  . Active Member of Clubs or Organizations: Not on file  . Attends Archivist Meetings: Not on file  . Marital Status: Not on file  Intimate Partner Violence:   . Fear of Current or Ex-Partner: Not on file  . Emotionally Abused: Not on file  . Physically Abused: Not on file  . Sexually Abused: Not on file    Family History  Problem Relation Age of Onset  . Breast cancer Mother 35  . Lung cancer Maternal Grandmother   . Diabetes type II Paternal Grandmother      Review of Systems  Constitutional: Positive for malaise/fatigue.  HENT: Negative.   Eyes: Negative.   Respiratory: Negative.   Cardiovascular: Negative.   Gastrointestinal: Negative.   Genitourinary: Positive for flank pain.  Skin: Negative.   Neurological: Negative.   Endo/Heme/Allergies: Negative.   Psychiatric/Behavioral: Negative.      Physical Exam: BP 117/75 (BP Location: Right Arm)   Pulse 97   Temp 98.7 F (37.1 C) (Oral)   Resp 18   Ht 5\' 6"  (1.676 m)   Wt 67.1 kg   LMP 06/23/2019 (Exact Date)   SpO2 99%   BMI 23.89 kg/m   Constitutional: Well nourished, well developed female in no acute distress.  HEENT: normal Skin: Warm and dry.  Cardiovascular: Regular rate and rhythm.   Extremity: no edema  Respiratory: Clear to auscultation bilateral. Normal respiratory effort Abdomen: soft, nontender, nondistended, no abnormal  masses, no epigastric pain,  Back: no CVAT Neuro: DTRs 2+, Cranial nerves grossly intact Psych: Alert and Oriented x3. No memory deficits. Normal mood and affect.  MS: normal gait, normal bilateral lower extremity ROM/strength/stability.   Consults: hematology/oncology and medicine and diabetes educator  Significant Findings/ Diagnostic Studies: labs:   Results for Emma Thomas, Emma Thomas (MRN [id number]) as of 11/02/2019 12:03  Ref. Range 11/01/2019 16:29 11/01/2019 21:43 11/01/2019 22:28 11/01/2019 22:28 11/02/2019 [id number]  BASIC METABOLIC PANEL Unknown   Rpt (A)  Rpt (A)  Sodium Latest Ref Range: 135 - 145 mmol/L   128 (L)  133 (L)  Potassium Latest Ref Range: 3.5 - 5.1 mmol/L   4.3  4.3  Chloride Latest Ref Range: 98 - 111 mmol/L   99  106  CO2 Latest Ref Range: 22 - 32 mmol/L   20 (L)  19 (L)  Glucose Latest Ref Range: 70 - 99 mg/dL   190 (H)  124 (H)  BUN Latest Ref Range: 6 - 20 mg/dL   32 (H)  31 (H)  Creatinine Latest Ref Range: 0.44 - 1.00 mg/dL   1.97 (H)  1.64 (H)  Calcium Latest Ref Range: 8.9 - 10.3 mg/dL   8.4 (L)  8.4 (L)  Anion gap Latest Ref Range: 5 - 15    9  8   GFR, Est Non African American Latest Ref Range: >60 mL/min   34 (L)  43 (L)  GFR, Est African American Latest Ref Range: >60 mL/min   40 (L)  50 (L)  WBC Latest Ref Range: 4.0 - 10.5 K/uL 8.2      RBC Latest Ref Range: 3.87 - 5.11 MIL/uL 2.57 (L)      Hemoglobin Latest Ref Range: 12.0 - 15.0 g/dL 7.6 (L)      HCT Latest Ref Range: 36.0 - 46.0 % 22.2 (L)      MCV Latest Ref Range: 80.0 - 100.0 fL 86.4      MCH Latest Ref Range: 26.0 - 34.0 pg 29.6      MCHC Latest Ref Range: 30.0 - 36.0 g/dL 34.2      RDW Latest Ref Range: 11.5 - 15.5 % 11.3 (L)      Platelets Latest Ref Range: 150 - 400 K/uL 374      nRBC Latest Ref Range: 0.0 - 0.2 % 0.0      Sodium, Urine Latest Units: mmol/L  49     Creatinine, Urine Latest Units: mg/dL  88     CULTURE, BLOOD (ROUTINE X 2) W REFLEX TO ID PANEL Unknown   Rpt Rpt   Results  for Emma Thomas, Emma Thomas (MRN [id number]) as of 11/02/2019 12:03  Ref. Range 11/01/2019 14:30  HIV Screen 4th Generation wRfx Latest Ref Range: NON REACTIVE  NON REACTIVE  Results for Emma Thomas, Emma Thomas (MRN [id number]) as of 11/02/2019 12:03  Ref. Range 11/01/2019 21:43  Sodium, Urine Latest Units: mmol/L 49  Creatinine, Urine Latest Units: mg/dL 88    Procedures: Endo Tool  Hospital Course: The patient was admitted to Labor and Delivery Triage for observation.   Discharge Condition: stable  Disposition: Discharge disposition: [place] Not Defined Care per [facility] providers  Diet: Diabetic diet  Discharge Activity: Activity as tolerated   Allergies as of 11/02/2019   No Known Allergies     Medication List    STOP taking these medications   Fifty50 Pen Needles 31G X 8 MM Misc Generic drug: Insulin Pen Needle   HumaLOG KwikPen 100 UNIT/ML KwikPen Generic drug: insulin lispro   Lantus SoloStar 100 UNIT/ML Solostar Pen Generic drug: Insulin Glargine   OneTouch Ultra test strip Generic drug: glucose blood   SUMAtriptan 50 MG tablet Commonly known as: IMITREX        Total time spent taking care of this patient: 60 minutes  Signed: Rod Can, CNM  11/02/2019, 11:43 AM

## 2019-11-02 NOTE — Progress Notes (Addendum)
ADA Standards of Care 2020 Diabetes in Pregnancy Target Glucose Ranges:  Fasting: 60 - 90 mg/dL Preprandial: 60 - 105 mg/dL 1 hr postprandial: Less than 140mg /dL (from first bite of meal) 2 hr postprandial: Less than 120 mg/dL (from first bit of meal)   Results for Emma Thomas, Emma Thomas (MRN [id number]) as of 11/02/2019 07:11  Ref. Range 11/02/2019 00:30 11/02/2019 01:32 11/02/2019 03:26 11/02/2019 04:34 11/02/2019 06:39  Glucose-Capillary Latest Ref Range: 70 - 99 mg/dL 125 (H)  0.9 units/hr 115 (H)  1.3 units/hr 123 (H)  1.5 units/hr 101 (H)  0.5 units/hr 117 (H)  1.3 units/hr   11/01/2019 10:28pm BMET results show: Anion Gap 9 CO2 level 20  11/02/2019 7:29am BMET results show: Anion Gap 8 CO2 level 19   Diabetes history: DM1 (makes NO insulin; requires basal, correction, and carb coverage insulin)  Outpatient Diabetes meds: Lantus 18 units QHS, Humalog with meals based on glucose and carbohydrates (1 unit for every 10 grams carbohydrates)  Current Insulin Orders: IV insulin Drip    MD- Note BMET from this AM remains stable.  When you deem pt ready to transition to SQ Insulin, recommend the following SQ Insulin regimen below:  Recommend the following SQ Insulin regimen per the Diabetic Pregnant Patient order set based on 12 weeks 6 days (almost 13 weeks) and 67.1 kg current weight:  Make sure to give the NPH Insulin 1 hour prior to d/c of the IV Insulin Drip  1. NPH Insulin 9 units BID (before breakfast and at bedtime)  2. Novolog 1 unit for every 9 grams Carbohydrates (TID with meals)  3. Novolog 0-14 units QID (Sensitive scale) (fasting and 2 hour post prandial) for correction)   Type 1 DM 12 weeks 6 days gestation.  In reviewing chart, noted patient was dx with DM24 at 23 years old. Patient saw Endocrinology (Dr. Solon Augusta) on 09/06/19 and A1C was 11.4% on 09/06/19. Per office note by Dr. Solon Augusta, patient was taking Lantus 18 units daily and Humalog 1 unit for  every 10 grams of carbs. Patient had initial prenatal visit with Dr. Gilman Schmidt on 10/25/19 and patient was encouraged to notify her Endocrinologist that she was pregnant.     --Will follow patient during hospitalization--  Wyn Quaker RN, MSN, CDE Diabetes Coordinator Inpatient Glycemic Control Team Team Pager: [phone] (8a-5p)

## 2019-11-02 NOTE — Progress Notes (Signed)
Pt being transferred to [facility] floor Rm 5813. Report given to Almeta Monas, RN at [facility]. Transport arranged with [employer].

## 2019-11-06 ENCOUNTER — Ambulatory Visit
Admission: RE | Admit: 2019-11-06 | Discharge: 2019-11-06 | Disposition: A | Discharge status: Home / Self Care | Payer: BC Managed Care – PPO | Source: Ambulatory Visit | Attending: Maternal & Fetal Medicine | Admitting: Maternal & Fetal Medicine

## 2019-11-06 ENCOUNTER — Other Ambulatory Visit: Payer: Self-pay

## 2019-11-06 LAB — CULTURE, BLOOD (ROUTINE X 2)
Culture: NO GROWTH
Culture: NO GROWTH
Special Requests: ADEQUATE
Special Requests: ADEQUATE

## 2019-11-06 NOTE — Progress Notes (Signed)
Pt did not show for scheduled appt at [facility] at [place] location today.  Notified Dr. Jeneen Rinks that pt has recently been in the hospital.

## 2019-11-07 ENCOUNTER — Other Ambulatory Visit: Payer: BC Managed Care – PPO

## 2019-11-07 ENCOUNTER — Encounter: Payer: BC Managed Care – PPO | Admitting: Certified Nurse Midwife

## 2019-11-08 DIAGNOSIS — O10919 Unspecified pre-existing hypertension complicating pregnancy, unspecified trimester: Secondary | ICD-10-CM | POA: Insufficient documentation

## 2019-11-08 DIAGNOSIS — D631 Anemia in chronic kidney disease: Secondary | ICD-10-CM | POA: Insufficient documentation

## 2019-11-13 ENCOUNTER — Encounter: Payer: Self-pay | Admitting: Emergency Medicine

## 2019-11-13 ENCOUNTER — Other Ambulatory Visit: Payer: Self-pay

## 2019-11-13 ENCOUNTER — Emergency Department
Admission: EM | Admit: 2019-11-13 | Discharge: 2019-11-13 | Disposition: A | Discharge status: Home / Self Care | Payer: BC Managed Care – PPO | Attending: Emergency Medicine | Admitting: Emergency Medicine

## 2019-11-13 DIAGNOSIS — O219 Vomiting of pregnancy, unspecified: Secondary | ICD-10-CM | POA: Diagnosis not present

## 2019-11-13 DIAGNOSIS — Z5321 Procedure and treatment not carried out due to patient leaving prior to being seen by health care provider: Secondary | ICD-10-CM | POA: Diagnosis not present

## 2019-11-13 DIAGNOSIS — Z3A14 14 weeks gestation of pregnancy: Secondary | ICD-10-CM | POA: Diagnosis not present

## 2019-11-13 LAB — COMPREHENSIVE METABOLIC PANEL
ALT: 14 U/L (ref 0–44)
AST: 18 U/L (ref 15–41)
Albumin: 2.4 g/dL — ABNORMAL LOW (ref 3.5–5.0)
Alkaline Phosphatase: 71 U/L (ref 38–126)
Anion gap: 9 (ref 5–15)
BUN: 28 mg/dL — ABNORMAL HIGH (ref 6–20)
CO2: 22 mmol/L (ref 22–32)
Calcium: 8.9 mg/dL (ref 8.9–10.3)
Chloride: 104 mmol/L (ref 98–111)
Creatinine, Ser: 1.86 mg/dL — ABNORMAL HIGH (ref 0.44–1.00)
GFR calc Af Amer: 43 mL/min — ABNORMAL LOW (ref >60)
GFR calc non Af Amer: 37 mL/min — ABNORMAL LOW (ref >60)
Glucose, Bld: 97 mg/dL (ref 70–99)
Potassium: 4.6 mmol/L (ref 3.5–5.1)
Sodium: 135 mmol/L (ref 135–145)
Total Bilirubin: 0.7 mg/dL (ref 0.3–1.2)
Total Protein: 7.4 g/dL (ref 6.5–8.1)

## 2019-11-13 LAB — CBC
HCT: 28.7 % — ABNORMAL LOW (ref 36.0–46.0)
Hemoglobin: 9.6 g/dL — ABNORMAL LOW (ref 12.0–15.0)
MCH: 29.6 pg (ref 26.0–34.0)
MCHC: 33.4 g/dL (ref 30.0–36.0)
MCV: 88.6 fL (ref 80.0–100.0)
Platelets: 378 10*3/uL (ref 150–400)
RBC: 3.24 MIL/uL — ABNORMAL LOW (ref 3.87–5.11)
RDW: 11.9 % (ref 11.5–15.5)
WBC: 10.3 10*3/uL (ref 4.0–10.5)
nRBC: 0 % (ref 0.0–0.2)

## 2019-11-13 LAB — HCG, QUANTITATIVE, PREGNANCY: hCG, Beta Chain, Quant, S: 149329 m[IU]/mL — ABNORMAL HIGH (ref <5)

## 2019-11-13 LAB — LIPASE, BLOOD: Lipase: 16 U/L (ref 11–51)

## 2019-11-13 LAB — GLUCOSE, CAPILLARY
Glucose-Capillary: 84 mg/dL (ref 70–99)
Glucose-Capillary: 93 mg/dL (ref 70–99)

## 2019-11-13 MED ORDER — SODIUM CHLORIDE 0.9 % IV BOLUS
1000.0000 mL | Freq: Once | INTRAVENOUS | Status: AC
  Administered 2019-11-13: 1000 mL via INTRAVENOUS

## 2019-11-13 MED ORDER — SODIUM CHLORIDE 0.9% FLUSH: 3.0000 mL | Freq: Once | INTRAVENOUS | Status: DC

## 2019-11-13 NOTE — ED Triage Notes (Signed)
C/O vomiting all day.  States she is [redacted] weeks pregnant, EDC:  05/10/2020.  Started having emesis last week.  Has been taking Zofran at home, with no relief.

## 2019-11-13 NOTE — ED Notes (Signed)
Patient states she wants to go home, she is vomiting and having pain and would rather do it at home.  This RN apologized for patient's wait and encouraged patient to stay, patient refused.

## 2019-11-29 ENCOUNTER — Other Ambulatory Visit: Payer: BC Managed Care – PPO

## 2019-11-30 ENCOUNTER — Encounter: Payer: Self-pay | Admitting: Cardiology

## 2020-02-13 ENCOUNTER — Other Ambulatory Visit: Payer: Self-pay

## 2020-02-13 ENCOUNTER — Encounter: Payer: Self-pay | Admitting: Obstetrics and Gynecology

## 2020-02-13 ENCOUNTER — Observation Stay
Admission: EM | Admit: 2020-02-13 | Discharge: 2020-02-13 | Payer: BC Managed Care – PPO | Attending: Obstetrics and Gynecology | Admitting: Obstetrics and Gynecology

## 2020-02-13 DIAGNOSIS — Z833 Family history of diabetes mellitus: Secondary | ICD-10-CM | POA: Insufficient documentation

## 2020-02-13 DIAGNOSIS — O10012 Pre-existing essential hypertension complicating pregnancy, second trimester: Secondary | ICD-10-CM | POA: Insufficient documentation

## 2020-02-13 DIAGNOSIS — E1021 Type 1 diabetes mellitus with diabetic nephropathy: Secondary | ICD-10-CM | POA: Diagnosis not present

## 2020-02-13 DIAGNOSIS — R42 Dizziness and giddiness: Secondary | ICD-10-CM | POA: Insufficient documentation

## 2020-02-13 DIAGNOSIS — Z79899 Other long term (current) drug therapy: Secondary | ICD-10-CM | POA: Diagnosis not present

## 2020-02-13 DIAGNOSIS — O24012 Pre-existing diabetes mellitus, type 1, in pregnancy, second trimester: Secondary | ICD-10-CM | POA: Diagnosis not present

## 2020-02-13 DIAGNOSIS — O26892 Other specified pregnancy related conditions, second trimester: Secondary | ICD-10-CM | POA: Diagnosis not present

## 2020-02-13 DIAGNOSIS — Z7901 Long term (current) use of anticoagulants: Secondary | ICD-10-CM | POA: Insufficient documentation

## 2020-02-13 DIAGNOSIS — Z3A27 27 weeks gestation of pregnancy: Secondary | ICD-10-CM | POA: Diagnosis not present

## 2020-02-13 DIAGNOSIS — O368320 Maternal care for abnormalities of the fetal heart rate or rhythm, second trimester, not applicable or unspecified: Secondary | ICD-10-CM | POA: Insufficient documentation

## 2020-02-13 DIAGNOSIS — O368120 Decreased fetal movements, second trimester, not applicable or unspecified: Secondary | ICD-10-CM | POA: Insufficient documentation

## 2020-02-13 DIAGNOSIS — Z7982 Long term (current) use of aspirin: Secondary | ICD-10-CM | POA: Insufficient documentation

## 2020-02-13 DIAGNOSIS — Z794 Long term (current) use of insulin: Secondary | ICD-10-CM | POA: Insufficient documentation

## 2020-02-13 DIAGNOSIS — Z20822 Contact with and (suspected) exposure to covid-19: Secondary | ICD-10-CM | POA: Diagnosis not present

## 2020-02-13 LAB — COMPREHENSIVE METABOLIC PANEL
ALT: 14 U/L (ref 0–44)
AST: 20 U/L (ref 15–41)
Albumin: 1.5 g/dL — ABNORMAL LOW (ref 3.5–5.0)
Alkaline Phosphatase: 65 U/L (ref 38–126)
Anion gap: 7 (ref 5–15)
BUN: 21 mg/dL — ABNORMAL HIGH (ref 6–20)
CO2: 17 mmol/L — ABNORMAL LOW (ref 22–32)
Calcium: 8.2 mg/dL — ABNORMAL LOW (ref 8.9–10.3)
Chloride: 110 mmol/L (ref 98–111)
Creatinine, Ser: 1.51 mg/dL — ABNORMAL HIGH (ref 0.44–1.00)
GFR calc Af Amer: 55 mL/min — ABNORMAL LOW (ref >60)
GFR calc non Af Amer: 48 mL/min — ABNORMAL LOW (ref >60)
Glucose, Bld: 184 mg/dL — ABNORMAL HIGH (ref 70–99)
Potassium: 5 mmol/L (ref 3.5–5.1)
Sodium: 134 mmol/L — ABNORMAL LOW (ref 135–145)
Total Bilirubin: 0.5 mg/dL (ref 0.3–1.2)
Total Protein: 5.9 g/dL — ABNORMAL LOW (ref 6.5–8.1)

## 2020-02-13 LAB — CBC
HCT: 22.9 % — ABNORMAL LOW (ref 36.0–46.0)
Hemoglobin: 7.9 g/dL — ABNORMAL LOW (ref 12.0–15.0)
MCH: 29.7 pg (ref 26.0–34.0)
MCHC: 34.5 g/dL (ref 30.0–36.0)
MCV: 86.1 fL (ref 80.0–100.0)
Platelets: 284 10*3/uL (ref 150–400)
RBC: 2.66 MIL/uL — ABNORMAL LOW (ref 3.87–5.11)
RDW: 12 % (ref 11.5–15.5)
WBC: 8.1 10*3/uL (ref 4.0–10.5)
nRBC: 0 % (ref 0.0–0.2)

## 2020-02-13 LAB — URINE DRUG SCREEN, QUALITATIVE (ARMC ONLY)
Amphetamines, Ur Screen: NOT DETECTED
Barbiturates, Ur Screen: NOT DETECTED
Benzodiazepine, Ur Scrn: NOT DETECTED
Cannabinoid 50 Ng, Ur ~~LOC~~: NOT DETECTED
Cocaine Metabolite,Ur ~~LOC~~: NOT DETECTED
MDMA (Ecstasy)Ur Screen: NOT DETECTED
Methadone Scn, Ur: NOT DETECTED
Opiate, Ur Screen: NOT DETECTED
Phencyclidine (PCP) Ur S: NOT DETECTED
Tricyclic, Ur Screen: NOT DETECTED

## 2020-02-13 LAB — URINALYSIS, COMPLETE (UACMP) WITH MICROSCOPIC
Bilirubin Urine: NEGATIVE
Glucose, UA: 50 mg/dL — AB
Hgb urine dipstick: NEGATIVE
Ketones, ur: NEGATIVE mg/dL
Leukocytes,Ua: NEGATIVE
Nitrite: NEGATIVE
Protein, ur: >=300 mg/dL — AB
Specific Gravity, Urine: 1.011 (ref 1.005–1.030)
pH: 7 (ref 5.0–8.0)

## 2020-02-13 LAB — RESPIRATORY PANEL BY RT PCR (FLU A&B, COVID)
Influenza A by PCR: NEGATIVE
Influenza B by PCR: NEGATIVE
SARS Coronavirus 2 by RT PCR: NEGATIVE

## 2020-02-13 LAB — GLUCOSE, CAPILLARY
Glucose-Capillary: 182 mg/dL — ABNORMAL HIGH (ref 70–99)
Glucose-Capillary: 186 mg/dL — ABNORMAL HIGH (ref 70–99)
Glucose-Capillary: 175 mg/dL — ABNORMAL HIGH (ref 70–99)

## 2020-02-13 LAB — PROTEIN / CREATININE RATIO, URINE
Creatinine, Urine: 58 mg/dL
Protein Creatinine Ratio: 11.41 mg/mg{Cre} — ABNORMAL HIGH (ref 0.00–0.15)
Total Protein, Urine: 662 mg/dL

## 2020-02-13 LAB — SAMPLE TO BLOOD BANK

## 2020-02-13 LAB — BETA-HYDROXYBUTYRIC ACID: Beta-Hydroxybutyric Acid: 0.21 mmol/L (ref 0.05–0.27)

## 2020-02-13 MED ORDER — SODIUM CHLORIDE 0.9 % IV SOLN: INTRAVENOUS | Status: DC

## 2020-02-13 MED ORDER — ACETAMINOPHEN 325 MG PO TABS: 650.0000 mg | ORAL_TABLET | ORAL | Status: DC | PRN

## 2020-02-13 MED ORDER — INSULIN ASPART 100 UNIT/ML ~~LOC~~ SOLN
0.0000 [IU] | Freq: Three times a day (TID) | SUBCUTANEOUS | Status: DC
  Administered 2020-02-13 (×2): 2 [IU] via SUBCUTANEOUS
  Filled 2020-02-13 (×2): qty 1

## 2020-02-13 NOTE — H&P (Signed)
Obstetric H&P   Chief Complaint: Feeling ill  Prenatal Care Provider: [facility]  History of Present Illness: 24 y.o. G1P0 [redacted]w[redacted]d by 05/10/2020, by Ultrasound with pregnancy complicated by Type I diabetes presenting to L&D via EMS for feeling dizzy and nauseated.  She reports that she has had some more frequent episodes of hypoglycemia recently.  Prior to presentation the patient checked her blood sugar and obtained a value of 63 so she ate.  On presentation fingerstick blood glucose was 180.  She states she does not feel like her current symptom constellation is similar to her prior episodes of DKA.  No current headaches, vision changes, or increased edema (although most recent clinic visit 02/13/2020 did report increased lower extremity edema).  She reports +FM, no LOF, no VB, no ctx.  Type I diabetes management improved during pregnancy HgbA1C down to from initial value of 11.0 to 6.6 on lantus 12 units AM, 10 un PM, and sliding scale.  The patient has a history of CHTN as well diabetic nephropathy.  BP have been well controlled on labetalol 200mg  po bid.  Given Type I DM the patient has undergone a normal fetal echocardiogram this pregnancy.    Pregravid weight 65.8 kg Total Weight Gain 14.1 kg  Review of Systems: 10 point review of systems negative unless otherwise noted in HPI  Past Medical History: Patient Active Problem List   Diagnosis Date Noted  . Labor and delivery indication for care or intervention 02/13/2020  . Pyelonephritis affecting pregnancy in first trimester 11/01/2019  . Supervision of high risk pregnancy, antepartum 11/01/2019    Clinic [facility] Prenatal Labs  Dating  Blood type: O/Positive/-- (12/09 1022)   Genetic Screen 1 Screen:    AFP:     Quad:     NIPS: Antibody:Negative (12/09 1022)  Anatomic [place]  Rubella: 18.30 (12/09 1022) Varicella:Immune  GTT Early:               Third trimester:  RPR: Non Reactive (12/09 1022)   Rhogam  HBsAg: Negative (12/09 1022)   TDaP  vaccine                       Flu Shot: HIV: Non Reactive (12/09 1022)   Baby Food                                GBS:   Contraception  Pap:  CBB     CS/VBAC    Support Person        . Depression 10/27/2019  . Diabetes mellitus ([place]) 10/27/2019    Overview:  History of DKA 2014   . History of chlamydia infection 10/27/2019  . History of depression 10/27/2019    Overview:  DX [facility] 03/2013   . Vitamin D deficiency 10/27/2019  . Liver function abnormality 08/15/2018  . Microalbuminuric diabetic nephropathy ([place]) 08/15/2018  . High anion gap metabolic acidosis   . AKI (acute kidney injury) ([place])   . Abdominal pain   . Pyelonephritis 07/30/2016  . Sepsis ([place]) 06/08/2016  . DKA (diabetic ketoacidoses) ([place]) 09/09/2015  . History of gonorrhea 08/13/2015  . Intrauterine device 05/03/2015  . Neuropathy 04/13/2013  . Weight loss, non-intentional 04/13/2013  . Depression, major, recurrent, moderate ([place]) 03/06/2013  . Hypertension 03/04/2013    Past Surgical History: Past Surgical History:  Procedure Laterality Date  . abscess removal     excision of bartholin  cyst  . Nexplanon  01/2011    Past Obstetric History: # 1 - Date: None, Sex: None, Weight: None, GA: None, Delivery: None, Apgar1: None, Apgar5: None, Living: None, Birth Comments: None   Past Gynecologic History:  Family History: Family History  Problem Relation Age of Onset  . Breast cancer Mother 17  . Lung cancer Maternal Grandmother   . Diabetes type II Paternal Grandmother     Social History: Social History   Socioeconomic History  . Marital status: Single    Spouse name: Not on file  . Number of children: Not on file  . Years of education: Not on file  . Highest education level: Not on file  Occupational History  . Not on file  Tobacco Use  . Smoking status: Never Smoker  . Smokeless tobacco: Never Used  Substance and Sexual Activity  . Alcohol use: Not Currently    Alcohol/week: 2.0 standard  drinks    Types: 2 Shots of liquor per week  . Drug use: No  . Sexual activity: Yes    Birth control/protection: None  Other Topics Concern  . Not on file  Social History Narrative  . Not on file   Social Determinants of Health   Financial Resource Strain:   . Difficulty of Paying Living Expenses:   Food Insecurity:   . Worried About Charity fundraiser in the Last Year:   . Arboriculturist in the Last Year:   Transportation Needs:   . Film/video editor (Medical):   Marland Kitchen Lack of Transportation (Non-Medical):   Physical Activity:   . Days of Exercise per Week:   . Minutes of Exercise per Session:   Stress:   . Feeling of Stress :   Social Connections:   . Frequency of Communication with Friends and Family:   . Frequency of Social Gatherings with Friends and Family:   . Attends Religious Services:   . Active Member of Clubs or Organizations:   . Attends Archivist Meetings:   Marland Kitchen Marital Status:   Intimate Partner Violence:   . Fear of Current or Ex-Partner:   . Emotionally Abused:   Marland Kitchen Physically Abused:   . Sexually Abused:     Medications: Prior to Admission medications   Medication Sig Start Date End Date Taking? Authorizing Provider  aspirin EC 81 MG tablet Take 81 mg by mouth daily.   Yes [provider]  enoxaparin (LOVENOX) 40 MG/0.4ML injection Inject 30 mg into the skin daily.    Yes [provider]  insulin glargine (LANTUS) 100 UNIT/ML injection Inject 14 Units into the skin 2 (two) times daily. 14u in morning and 12u at night   Yes [provider]  insulin lispro (HUMALOG) 100 UNIT/ML injection Inject into the skin 3 (three) times daily before meals.   Yes [provider]  labetalol (NORMODYNE) 200 MG tablet Take 200 mg by mouth 2 (two) times daily.   Yes [provider]  Multiple Vitamin (MULTIVITAMIN) capsule Take 1 capsule by mouth daily.   Yes [provider]    Allergies: No Known  Allergies  Physical Exam: Vitals: Blood pressure (!) 139/55, pulse (!) 125, temperature 98.5 F (36.9 C), temperature source Oral, height 5\' 6"  (1.676 m), weight 79.8 kg, last menstrual period 06/23/2019, SpO2 100 %. Temp:  [98.5 F (36.9 C)] 98.5 F (36.9 C) (03/30 1435) Pulse Rate:  [81-125] 125 (03/30 1435) BP: (139-157)/(55-105) 139/55 (03/30 1435) SpO2:  [100 %]  100 % (03/30 1450) Weight:  [79.8 kg] 79.8 kg (03/30 1434)   Urine Dip Protein: none  FHT: 100, moderate variability, 10 x 10 accels, no decels Toco: none  General: NAD, well nourished, appears stated age 42: normocephalic, anicteric Pulmonary: No increased work of breathing Cardiovascular: RRR, distal pulses 2+ Abdomen: Gravid, non-tender Le[place]: vtx Extremities: no edema, erythema, or tenderness Neurologic: Grossly intact Psychiatric: mood appropriate, affect full  Labs: No results found for this or any previous visit (from the past 24 hour(s)).  Assessment: 24 y.o. G1P0 [redacted]w[redacted]d by 05/10/2020, by Ultrasound presenting via EMS for feeling ill  Plan: 1) [place] - BG 182 on admission, report home BG reading of 63 earlier, then ate 30 minutes prior to ambulate  - UA to check for ketones, will check anion gap on CMP - 4 units of sliding scale now - will start NS for IV access  2) Fetus - cat II tracing.  No prior issues with low baseline, normal fetal echo  3) Elevated BP - mild range on admission, no history of elevated BPs this pregnancy but baseline  PNL - Blood type O/Positive/-- (12/09 1022) / Anti-bodyscreen Negative (12/09 1022) / Rubella 18.30 (12/09 1022) /  RPR Non Reactive (12/09 1022) / HBsAg Negative (12/09 1022) / HIV NON REACTIVE (12/16 1430) / 1-hr OGTT N/A   4) Immunization History -  Immunization History  Administered Date(s) Administered  . Pneumococcal Polysaccharide-23 09/10/2015    5) Disposition - pending transfer  Malachy Mood, MD, [place], [place]  [facility] 02/13/2020, 2:52 PM

## 2020-02-13 NOTE — OB Triage Note (Signed)
Pt presents to the ED via EMS c/o dizziness and decreased fetal movement. Pt is a 24 y.o G1P0 [redacted]w[redacted]d. Pt stated, "I woke up and felt weird. I also peed myself. But Im just shaking and feel bad." Pt denies leaking of fluid, vaginal bleeding, or contractions. Pt reports decreased fetal movement since she woke up around noon as well. VS taken, BP slightly elevated. External monitors applied and assessing, initial FHR 100. A.Staebler, MD aware and at bedside at 1413 . Bedside US performed and fetal heart visualized by A.Georgianne Fick, MD. Orders placed. Labs collected.

## 2020-02-13 NOTE — Progress Notes (Signed)
Lab results reviewed with no evidence of anion gap.  Beta-hydroxybutyric acid pending as are urine ketones.  Fetal heart tones remain in around 100 with moderate variability and positive 10 X 10 accels. COVID swab results also pending.  Concern for DKA lower given the normal anion gap.  The patient has been accepted for transfer at [facility] for further work up of possible fetal heart block.

## 2020-02-13 NOTE — Progress Notes (Signed)
Pt transferred to [facility] Rm 5420 accepting provider Wynona Neat, MD. RN gave report to Apolonio Schneiders, [place] at [facility]. Pt in stable condition and transferred via [facility] ambulance.

## 2020-02-13 NOTE — Discharge Summary (Signed)
Physician Discharge Summary  Patient ID: Emma Thomas MRN: [id number] DOB/AGE: 26/27/1997 23 y.o.  Admit date: 02/13/2020 Discharge date: 02/13/2020  Admission Diagnoses: Generalized malaise  Discharge Diagnoses: Fetal heart block  Discharged Condition: good  Hospital Course: 24 y.o. G1P0 at [redacted]w[redacted]d with a pregnancy complicated by Type I DM and CHTN who presented via EMS for generalized malaise.  Patient has a reactive fetal heart rate tracing on admission by [facility] around 100 raising concern for possible heart block.  Reported hypoglycemic episode prior to presentation but on presentation BG in the 180's.  Received SSI 4 units total.  Laboratory work up was negative for DKA with negative ketones on UA negative anion gap.  Fetal heart tones baseline did increse to 110-120 on discharge.  +FM, no LOF, no VB, no ctx.  Patient transferred to [facility] for further evaluation and management.    BP initial mild range on presentation normalized and normotensive on discharge.    Consults: None  Significant Diagnostic Studies:  Results for orders placed or performed during the hospital encounter of 02/13/20 (from the past 24 hour(s))  Glucose, capillary     Status: Abnormal   Collection Time: 02/13/20  2:15 PM  Result Value Ref Range   Glucose-Capillary 182 (H) 70 - 99 mg/dL  CBC     Status: Abnormal   Collection Time: 02/13/20  2:39 PM  Result Value Ref Range   WBC 8.1 4.0 - 10.5 K/uL   RBC 2.66 (L) 3.87 - 5.11 MIL/uL   Hemoglobin 7.9 (L) 12.0 - 15.0 g/dL   HCT 22.9 (L) 36.0 - 46.0 %   MCV 86.1 80.0 - 100.0 fL   MCH 29.7 26.0 - 34.0 pg   MCHC 34.5 30.0 - 36.0 g/dL   RDW 12.0 11.5 - 15.5 %   Platelets 284 150 - 400 K/uL   nRBC 0.0 0.0 - 0.2 %  Comprehensive metabolic panel     Status: Abnormal   Collection Time: 02/13/20  2:39 PM  Result Value Ref Range   Sodium 134 (L) 135 - 145 mmol/L   Potassium 5.0 3.5 - 5.1 mmol/L   Chloride 110 98 - 111 mmol/L   CO2 17 (L) 22 - 32 mmol/L   Glucose,  Bld 184 (H) 70 - 99 mg/dL   BUN 21 (H) 6 - 20 mg/dL   Creatinine, Ser 1.51 (H) 0.44 - 1.00 mg/dL   Calcium 8.2 (L) 8.9 - 10.3 mg/dL   Total Protein 5.9 (L) 6.5 - 8.1 g/dL   Albumin 1.5 (L) 3.5 - 5.0 g/dL   AST 20 15 - 41 U/L   ALT 14 0 - 44 U/L   Alkaline Phosphatase 65 38 - 126 U/L   Total Bilirubin 0.5 0.3 - 1.2 mg/dL   GFR calc non Af Amer 48 (L) >60 mL/min   GFR calc Af Amer 55 (L) >60 mL/min   Anion gap 7 5 - 15  Beta-hydroxybutyric acid     Status: None   Collection Time: 02/13/20  2:39 PM  Result Value Ref Range   Beta-Hydroxybutyric Acid 0.21 0.05 - 0.27 mmol/L  Sample to Blood Bank     Status: None   Collection Time: 02/13/20  2:39 PM  Result Value Ref Range   Blood Bank Specimen SAMPLE AVAILABLE FOR TESTING    Sample Expiration      02/16/2020,2359 Performed at [facility], [address] [place], [place] [address]   Respiratory Panel by RT PCR (Flu A&B, Covid) -  Nasopharyngeal Swab     Status: None   Collection Time: 02/13/20  2:41 PM   Specimen: Nasopharyngeal Swab  Result Value Ref Range   SARS Coronavirus 2 by RT PCR NEGATIVE NEGATIVE   Influenza A by PCR NEGATIVE NEGATIVE   Influenza B by PCR NEGATIVE NEGATIVE  Urinalysis, Complete w Microscopic     Status: Abnormal   Collection Time: 02/13/20  2:43 PM  Result Value Ref Range   Color, Urine YELLOW (A) YELLOW   APPearance HAZY (A) CLEAR   Specific Gravity, Urine 1.011 1.005 - 1.030   pH 7.0 5.0 - 8.0   Glucose, UA 50 (A) NEGATIVE mg/dL   Hgb urine dipstick NEGATIVE NEGATIVE   Bilirubin Urine NEGATIVE NEGATIVE   Ketones, ur NEGATIVE NEGATIVE mg/dL   Protein, ur >=300 (A) NEGATIVE mg/dL   Nitrite NEGATIVE NEGATIVE   Leukocytes,Ua NEGATIVE NEGATIVE   RBC / HPF 0-5 0 - 5 RBC/hpf   WBC, UA 6-10 0 - 5 WBC/hpf   Bacteria, UA RARE (A) NONE SEEN   Squamous Epithelial / LPF 0-5 0 - 5   Mucus PRESENT    Hyaline Casts, UA PRESENT   Urine Drug Screen, Qualitative ([facility] only)     Status: None    Collection Time: 02/13/20  2:43 PM  Result Value Ref Range   Tricyclic, Ur Screen NONE DETECTED NONE DETECTED   Amphetamines, Ur Screen NONE DETECTED NONE DETECTED   MDMA (Ecstasy)Ur Screen NONE DETECTED NONE DETECTED   Cocaine Metabolite,Ur [place] NONE DETECTED NONE DETECTED   Opiate, Ur Screen NONE DETECTED NONE DETECTED   Phencyclidine (PCP) Ur S NONE DETECTED NONE DETECTED   Cannabinoid 50 Ng, Ur  NONE DETECTED NONE DETECTED   Barbiturates, Ur Screen NONE DETECTED NONE DETECTED   Benzodiazepine, Ur Scrn NONE DETECTED NONE DETECTED   Methadone Scn, Ur NONE DETECTED NONE DETECTED  Glucose, capillary     Status: Abnormal   Collection Time: 02/13/20  2:59 PM  Result Value Ref Range   Glucose-Capillary 186 (H) 70 - 99 mg/dL  Glucose, capillary     Status: Abnormal   Collection Time: 02/13/20  3:51 PM  Result Value Ref Range   Glucose-Capillary 175 (H) 70 - 99 mg/dL   Baseline: 100 Variability: moderate Accelerations: present Decelerations: absent Tocometry: none The patient was monitored for 30 minutes, fetal heart rate tracing was deemed reactive with 10x10 accles, category II tracing for low baseline  CPT 59025   Treatments: IV hydration  Discharge Exam: Blood pressure (!) 141/83, pulse 93, temperature 97.8 F (36.6 C), temperature source Oral, resp. rate 16, height 5\' 6"  (1.676 m), weight 79.8 kg, last menstrual period 06/23/2019, SpO2 100 %. General appearance: alert, appears stated age and no distress GI: soft, gravid, non-tender Extremities: no edema, redness or tenderness in the calves or thighs  Disposition: Discharge disposition: [place] Not Defined       Discharge Instructions    Discharge activity:  No Restrictions   Complete by: As directed    Discharge diet:  No restrictions   Complete by: As directed    No sexual activity restrictions   Complete by: As directed    Notify physician for a general feeling that "something is not  right"   Complete by: As directed    Notify physician for increase or change in vaginal discharge   Complete by: As directed    Notify physician for intestinal cramps, with or without diarrhea, sometimes described as "gas pain"  Complete by: As directed    Notify physician for leaking of fluid   Complete by: As directed    Notify physician for low, dull backache, unrelieved by heat or Tylenol   Complete by: As directed    Notify physician for menstrual like cramps   Complete by: As directed    Notify physician for pelvic pressure   Complete by: As directed    Notify physician for uterine contractions.  These may be painless and feel like the uterus is tightening or the baby is  "balling up"   Complete by: As directed    Notify physician for vaginal bleeding   Complete by: As directed    PRETERM LABOR:  Includes any of the follwing symptoms that occur between 20 - [redacted] weeks gestation.  If these symptoms are not stopped, preterm labor can result in preterm delivery, placing your baby at risk   Complete by: As directed      Allergies as of 02/13/2020   No Known Allergies     Medication List    TAKE these medications   aspirin EC 81 MG tablet Take 81 mg by mouth daily.   enoxaparin 40 MG/0.4ML injection Commonly known as: LOVENOX Inject 30 mg into the skin daily.   insulin glargine 100 UNIT/ML injection Commonly known as: LANTUS Inject 14 Units into the skin 2 (two) times daily. 14u in morning and 12u at night   insulin lispro 100 UNIT/ML injection Commonly known as: HUMALOG Inject into the skin 3 (three) times daily before meals.   labetalol 200 MG tablet Commonly known as: NORMODYNE Take 200 mg by mouth 2 (two) times daily.   multivitamin capsule Take 1 capsule by mouth daily.        Signed: Malachy Mood 02/13/2020, 4:17 PM

## 2020-02-14 LAB — HEMOGLOBIN A1C
Hgb A1c MFr Bld: 6.6 % — ABNORMAL HIGH (ref 4.8–5.6)
Mean Plasma Glucose: 142.72 mg/dL

## 2020-02-15 DIAGNOSIS — O1413 Severe pre-eclampsia, third trimester: Secondary | ICD-10-CM | POA: Insufficient documentation

## 2020-02-22 ENCOUNTER — Encounter: Payer: BC Managed Care – PPO | Admitting: Obstetrics & Gynecology

## 2020-03-14 ENCOUNTER — Emergency Department
Admission: EM | Admit: 2020-03-14 | Discharge: 2020-03-14 | Disposition: A | Discharge status: Home / Self Care | Payer: BC Managed Care – PPO | Attending: Student | Admitting: Student

## 2020-03-14 ENCOUNTER — Emergency Department: Payer: BC Managed Care – PPO

## 2020-03-14 ENCOUNTER — Encounter: Payer: Self-pay | Admitting: Emergency Medicine

## 2020-03-14 ENCOUNTER — Other Ambulatory Visit: Payer: Self-pay

## 2020-03-14 DIAGNOSIS — Z7982 Long term (current) use of aspirin: Secondary | ICD-10-CM | POA: Diagnosis not present

## 2020-03-14 DIAGNOSIS — E109 Type 1 diabetes mellitus without complications: Secondary | ICD-10-CM | POA: Insufficient documentation

## 2020-03-14 DIAGNOSIS — Z79899 Other long term (current) drug therapy: Secondary | ICD-10-CM | POA: Diagnosis not present

## 2020-03-14 DIAGNOSIS — M79605 Pain in left leg: Secondary | ICD-10-CM | POA: Insufficient documentation

## 2020-03-14 DIAGNOSIS — I10 Essential (primary) hypertension: Secondary | ICD-10-CM | POA: Insufficient documentation

## 2020-03-14 LAB — GLUCOSE, CAPILLARY: Glucose-Capillary: 285 mg/dL — ABNORMAL HIGH (ref 70–99)

## 2020-03-14 MED ORDER — HYDROCODONE-ACETAMINOPHEN 5-325 MG PO TABS: 1.0000 | ORAL_TABLET | Freq: Four times a day (QID) | ORAL | 0 refills | Status: DC | PRN

## 2020-03-14 MED ORDER — OXYCODONE-ACETAMINOPHEN 5-325 MG PO TABS
1.0000 | ORAL_TABLET | Freq: Once | ORAL | Status: AC
  Administered 2020-03-14: 13:00:00 1 via ORAL
  Filled 2020-03-14: qty 1

## 2020-03-14 NOTE — ED Notes (Signed)
See triage note  Presents with pain to left upper leg  States pain started about 2 weeks ago   States pain is anterior upper leg  States she developed pain after having a C-section  Has been seen times 2 for same  Had negative u/s  Was also placed on steroids which she finished yesterday

## 2020-03-14 NOTE — ED Notes (Signed)
CBG 285. 

## 2020-03-14 NOTE — ED Notes (Signed)
Pt given warm blanket PT given cup to collect UA, states "it will be awhile before I can go"

## 2020-03-14 NOTE — ED Provider Notes (Signed)
[facility] Emergency Department Provider Note  ____________________________________________   None    (approximate)  I have reviewed the triage vital signs and the nursing notes.   HISTORY  Chief Complaint Leg Pain    HPI Emma Thomas is a 24 y.o. female presents emergency department complaint of left upper leg pain.  Symptoms started about 2 weeks ago.  Patient has C-section 02/24/2020.  Was seen at an urgent care and they did ultrasound for DVT which was negative.  She was given oxycodone but is run out of medication.  She continued to have left upper thigh pain.  No known injury.  Denies back pain.  Patient also had swelling in the extremities throughout her pregnancy and still has pitting edema.    Past Medical History:  Diagnosis Date  . Diabetes mellitus without complication (HCC)    Type 1 DM  . Essential hypertension   . Hypertension 03/04/2013  . Neurologic disorder    Both feet  . Recurrent UTI     Patient Active Problem List   Diagnosis Date Noted  . Labor and delivery indication for care or intervention 02/13/2020  . Pyelonephritis affecting pregnancy in first trimester 11/01/2019  . Supervision of high risk pregnancy, antepartum 11/01/2019  . Depression 10/27/2019  . Diabetes mellitus ([place]) 10/27/2019  . History of chlamydia infection 10/27/2019  . History of depression 10/27/2019  . Vitamin D deficiency 10/27/2019  . Liver function abnormality 08/15/2018  . Microalbuminuric diabetic nephropathy ([place]) 08/15/2018  . High anion gap metabolic acidosis   . AKI (acute kidney injury) ([place])   . Abdominal pain   . Pyelonephritis 07/30/2016  . Sepsis ([place]) 06/08/2016  . DKA (diabetic ketoacidoses) ([place]) 09/09/2015  . History of gonorrhea 08/13/2015  . Intrauterine device 05/03/2015  . Neuropathy 04/13/2013  . Weight loss, non-intentional 04/13/2013  . Depression, major, recurrent, moderate ([place]) 03/06/2013  . Hypertension  03/04/2013    Past Surgical History:  Procedure Laterality Date  . abscess removal     excision of bartholin cyst  . Nexplanon  01/2011    Prior to Admission medications   Medication Sig Start Date End Date Taking? Authorizing Provider  aspirin EC 81 MG tablet Take 81 mg by mouth daily.    [provider]  HYDROcodone-acetaminophen (NORCO/VICODIN) 5-325 MG tablet Take 1 tablet by mouth every 6 (six) hours as needed for moderate pain. 03/14/20   Fisher, Linden Dolin, PA-C  insulin glargine (LANTUS) 100 UNIT/ML injection Inject 14 Units into the skin 2 (two) times daily. 14u in morning and 12u at night    [provider]  insulin lispro (HUMALOG) 100 UNIT/ML injection Inject into the skin 3 (three) times daily before meals.    [provider]  labetalol (NORMODYNE) 200 MG tablet Take 200 mg by mouth 2 (two) times daily.    [provider]  Multiple Vitamin (MULTIVITAMIN) capsule Take 1 capsule by mouth daily.    [provider]    Allergies [name].  Family History  Problem Relation Age of Onset  . Breast cancer Mother 57  . Lung cancer Maternal Grandmother   . Diabetes type II Paternal Grandmother     Social History Social History   Tobacco Use  . Smoking status: Never Smoker  . Smokeless tobacco: Never Used  Substance Use Topics  . Alcohol use: Not Currently    Alcohol/week: 2.0 standard drinks    Types: 2 Shots of liquor per week  .  Drug use: No    Review of Systems  Constitutional: No fever/chills Eyes: No visual changes. ENT: No sore throat. Respiratory: Denies cough Cardiovascular: Denies chest pain Gastrointestinal: Denies abdominal pain Genitourinary: Negative for dysuria. Musculoskeletal: Negative for back pain.  Positive for left flank pain Skin: Negative for rash. Psychiatric: no mood changes,     ____________________________________________   PHYSICAL EXAM:  VITAL SIGNS: ED Triage  Vitals  Enc Vitals Group     BP 03/14/20 1054 (!) 137/97     Pulse Rate 03/14/20 1054 99     Resp 03/14/20 1054 20     Temp 03/14/20 1054 98.7 F (37.1 C)     Temp Source 03/14/20 1054 Oral     SpO2 03/14/20 1054 100 %     Weight 03/14/20 1056 180 lb (81.6 kg)     Height 03/14/20 1056 5\' 6"  (1.676 m)     Head Circumference --      Peak Flow --      Pain Score 03/14/20 1055 8     Pain Loc --      Pain Edu? --      Excl. in [place]? --     Constitutional: Alert and oriented. Well appearing and in no acute distress. Eyes: Conjunctivae are normal.  Head: Atraumatic. Nose: No congestion/rhinnorhea. Mouth/Throat: Mucous membranes are moist.   Neck:  supple no lymphadenopathy noted Cardiovascular: Normal rate, regular rhythm. Heart sounds are normal Respiratory: Normal respiratory effort.  No retractions, lungs c t a  Abd: soft nontender bs normal all 4 quad GU: deferred Musculoskeletal: FROM all extremities, warm and well perfused, left thigh is tender to palpation in the anterior aspect, 2+ pitting edema noted bilaterally Neurologic:  Normal speech and language.  Skin:  Skin is warm, dry and intact. No rash noted. Psychiatric: Mood and affect are normal. Speech and behavior are normal.  ____________________________________________   LABS (all labs ordered are listed, but only abnormal results are displayed)  Labs Reviewed  GLUCOSE, CAPILLARY - Abnormal; Notable for the following components:      Result Value   Glucose-Capillary 285 (*)    All other components within normal limits  CBG MONITORING, ED   ____________________________________________   ____________________________________________  RADIOLOGY  Ultrasound left lower extremity for DVT is negative X-ray of the left femur is negative  ____________________________________________   PROCEDURES  Procedure(s) performed: Percocet 1 p.o.   Procedures    ____________________________________________   INITIAL  IMPRESSION / ASSESSMENT AND PLAN / ED COURSE  Pertinent labs & imaging results that were available during my care of the patient were reviewed by me and considered in my medical decision making (see chart for details).   Patient is 24 year old female postpartum with C-section delivery on 410/21.  Complaining of left leg pain.  Patient has history of type 1 diabetes.  Hypertension.  States she retain fluid throughout her pregnancy and still has pitting edema bilaterally.  Physical exam shows the left side to be tender to palpation, 2+ pitting edema noted bilaterally  DDx: Postpartum preeclampsia, DVT, diabetic ketoacidosis  FSBS is 285, patient just states that she states this is normal for her, ultrasound of the lower extremities negative for DVT, x-ray is normal  I did explain the findings to the patient.  She is very aggravated and states that she just needs pain medication.  I did caution her that the continued use of narcotics may make her dependent.  Explained to her that she has had a series surgery  due to her C-section.  It may just take time for her to heal.  I do not see any obvious reason for her to have pain in her leg.  I feel that she should follow-up with her surgeon and not another ER.  She states she understands.  She was discharged stable condition with prescription for Vicodin and encouraged to take Benadryl at night to sleep instead of the pain medication.   Emma Thomas was evaluated in Emergency Department on 03/14/2020 for the symptoms described in the history of present illness. She was evaluated in the context of the global COVID-19 pandemic, which necessitated consideration that the patient might be at risk for infection with the SARS-CoV-2 virus that causes COVID-19. Institutional protocols and algorithms that pertain to the evaluation of patients at risk for COVID-19 are in a state of rapid change based on information released by regulatory bodies including the CDC and  federal and state organizations. These policies and algorithms were followed during the patient's care in the ED.   As part of my medical decision making, I reviewed the following data within the [place] notes reviewed and incorporated, Labs reviewed , Old chart reviewed, Radiograph reviewed , Notes from prior ED visits and [place] Controlled Substance Database  ____________________________________________   FINAL CLINICAL IMPRESSION(S) / ED DIAGNOSES  Final diagnoses:  Leg pain, anterior, left      NEW MEDICATIONS STARTED DURING THIS VISIT:  Discharge Medication List as of 03/14/2020  1:55 PM    START taking these medications   Details  HYDROcodone-acetaminophen (NORCO/VICODIN) 5-325 MG tablet Take 1 tablet by mouth every 6 (six) hours as needed for moderate pain., Starting Thu 03/14/2020, Normal         Note:  This document was prepared using Dragon voice recognition software and may include unintentional dictation errors.    Versie Starks, PA-C 03/14/20 1741    Lilia Pro., MD 03/15/20 [phone]

## 2020-03-14 NOTE — Discharge Instructions (Addendum)
Follow-up with your regular OB/GYN.  Return emergency department worsening

## 2020-03-14 NOTE — ED Triage Notes (Signed)
Patient to ER for c/o left leg pain. Patient reports having C-section on 4/10, has had leg pain since then. Was seen at [facility] first, then urgent care. Had ultrasound of leg one week ago that was negative for DVT. Patient was given Oxycodone at [facility], but has run out. Was given steroid at urgent care that did not help.

## 2020-03-18 ENCOUNTER — Telehealth: Payer: Self-pay | Admitting: Emergency Medicine

## 2020-03-18 NOTE — Telephone Encounter (Signed)
Called patient at request of Derrell Lolling MD due to bp was elevated during ED visit and would like to make sure she rechecks and seeks medical attention.  I left a message asking the patient to call me back.

## 2020-05-02 ENCOUNTER — Other Ambulatory Visit: Payer: Self-pay

## 2020-05-02 ENCOUNTER — Emergency Department
Admission: EM | Admit: 2020-05-02 | Discharge: 2020-05-02 | Disposition: A | Discharge status: Home / Self Care | Payer: Medicaid Other | Attending: Emergency Medicine | Admitting: Emergency Medicine

## 2020-05-02 ENCOUNTER — Encounter: Payer: Self-pay | Admitting: Emergency Medicine

## 2020-05-02 DIAGNOSIS — L0291 Cutaneous abscess, unspecified: Secondary | ICD-10-CM | POA: Diagnosis present

## 2020-05-02 DIAGNOSIS — E1140 Type 2 diabetes mellitus with diabetic neuropathy, unspecified: Secondary | ICD-10-CM | POA: Diagnosis not present

## 2020-05-02 DIAGNOSIS — I10 Essential (primary) hypertension: Secondary | ICD-10-CM | POA: Diagnosis not present

## 2020-05-02 DIAGNOSIS — Z7982 Long term (current) use of aspirin: Secondary | ICD-10-CM | POA: Diagnosis not present

## 2020-05-02 DIAGNOSIS — Z79899 Other long term (current) drug therapy: Secondary | ICD-10-CM | POA: Insufficient documentation

## 2020-05-02 DIAGNOSIS — L02214 Cutaneous abscess of groin: Secondary | ICD-10-CM | POA: Insufficient documentation

## 2020-05-02 DIAGNOSIS — Z794 Long term (current) use of insulin: Secondary | ICD-10-CM | POA: Insufficient documentation

## 2020-05-02 MED ORDER — HYDROCODONE-ACETAMINOPHEN 5-325 MG PO TABS: 1.0000 | ORAL_TABLET | ORAL | 0 refills | Status: DC | PRN

## 2020-05-02 MED ORDER — HYDROCODONE-ACETAMINOPHEN 5-325 MG PO TABS
1.0000 | ORAL_TABLET | ORAL | Status: AC
  Administered 2020-05-02: 1 via ORAL
  Filled 2020-05-02: qty 1

## 2020-05-02 MED ORDER — CLINDAMYCIN HCL 150 MG PO CAPS
300.0000 mg | ORAL_CAPSULE | Freq: Once | ORAL | Status: AC
  Administered 2020-05-02: 300 mg via ORAL
  Filled 2020-05-02: qty 2

## 2020-05-02 MED ORDER — LIDOCAINE-EPINEPHRINE 1 %-1:100000 IJ SOLN
30.0000 mL | Freq: Once | INTRAMUSCULAR | Status: AC
  Administered 2020-05-02: 30 mL
  Filled 2020-05-02: qty 1

## 2020-05-02 NOTE — ED Provider Notes (Signed)
[facility] EMERGENCY DEPARTMENT Provider Note   CSN: [id number] Arrival date & time: 05/02/20  1825     History Chief Complaint  Patient presents with  . Abscess    Emma Thomas is a 24 y.o. female with left groin abscess x 2 days. Started on doxycycline 2 days ago with no improvement. Comes to the ED today for further evaluation.  Patient states abscess is more fluctuant.  She is having more pain and discomfort.  No fevers.  HPI     Past Medical History:  Diagnosis Date  . Diabetes mellitus without complication (HCC)    Type 1 DM  . Essential hypertension   . Hypertension 03/04/2013  . Neurologic disorder    Both feet  . Recurrent UTI     Patient Active Problem List   Diagnosis Date Noted  . Labor and delivery indication for care or intervention 02/13/2020  . Pyelonephritis affecting pregnancy in first trimester 11/01/2019  . Supervision of high risk pregnancy, antepartum 11/01/2019  . Depression 10/27/2019  . Diabetes mellitus ([place]) 10/27/2019  . History of chlamydia infection 10/27/2019  . History of depression 10/27/2019  . Vitamin D deficiency 10/27/2019  . Liver function abnormality 08/15/2018  . Microalbuminuric diabetic nephropathy ([place]) 08/15/2018  . High anion gap metabolic acidosis   . AKI (acute kidney injury) ([place])   . Abdominal pain   . Pyelonephritis 07/30/2016  . Sepsis ([place]) 06/08/2016  . DKA (diabetic ketoacidoses) ([place]) 09/09/2015  . History of gonorrhea 08/13/2015  . Intrauterine device 05/03/2015  . Neuropathy 04/13/2013  . Weight loss, non-intentional 04/13/2013  . Depression, major, recurrent, moderate ([place]) 03/06/2013  . Hypertension 03/04/2013    Past Surgical History:  Procedure Laterality Date  . abscess removal     excision of bartholin cyst  . Nexplanon  01/2011     OB History    Gravida  1   Para      Term      Preterm      AB      Living        SAB      TAB      Ectopic       Multiple      Live Births              Family History  Problem Relation Age of Onset  . Breast cancer Mother 57  . Lung cancer Maternal Grandmother   . Diabetes type II Paternal Grandmother     Social History   Tobacco Use  . Smoking status: Never Smoker  . Smokeless tobacco: Never Used  Vaping Use  . Vaping Use: Never used  Substance Use Topics  . Alcohol use: Not Currently    Alcohol/week: 2.0 standard drinks    Types: 2 Shots of liquor per week  . Drug use: No    Home Medications Prior to Admission medications   Medication Sig Start Date End Date Taking? Authorizing Provider  aspirin EC 81 MG tablet Take 81 mg by mouth daily.    [provider]  HYDROcodone-acetaminophen (NORCO/VICODIN) 5-325 MG tablet Take 1 tablet by mouth every 6 (six) hours as needed for moderate pain. 03/14/20   Fisher, Linden Dolin, PA-C  insulin glargine (LANTUS) 100 UNIT/ML injection Inject 14 Units into the skin 2 (two) times daily. 14u in morning and 12u at night    [provider]  insulin lispro (HUMALOG) 100 UNIT/ML injection Inject into the skin 3 (three) times  daily before meals.    [provider]  labetalol (NORMODYNE) 200 MG tablet Take 200 mg by mouth 2 (two) times daily.    [provider]  Multiple Vitamin (MULTIVITAMIN) capsule Take 1 capsule by mouth daily.    [provider]    Allergies    [name].  Review of Systems   Review of Systems  Constitutional: Negative for fever.  Gastrointestinal: Negative for nausea and vomiting.  Musculoskeletal: Negative for joint swelling and myalgias.  Skin: Positive for wound. Negative for rash.    Physical Exam Updated Vital Signs BP (!) 137/95 (BP Location: Left Arm)   Pulse (!) 104   Temp 98.4 F (36.9 C) (Oral)   Resp 20   Ht 5\' 6"  (1.676 m)   Wt 68 kg   LMP 06/23/2019 (Exact Date)   SpO2 100%   BMI 24.21 kg/m   Physical Exam Constitutional:       Appearance: She is well-developed.  HENT:     Head: Normocephalic and atraumatic.  Eyes:     Conjunctiva/sclera: Conjunctivae normal.  Cardiovascular:     Rate and Rhythm: Normal rate.  Pulmonary:     Effort: Pulmonary effort is normal. No respiratory distress.  Musculoskeletal:        General: No swelling or deformity. Normal range of motion.     Cervical back: Normal range of motion.  Skin:    General: Skin is warm.     Findings: No rash.     Comments: 2 cm diameter abscess left groin, fluctuant. Mild surrounding erythema. No streaking.   Neurological:     Mental Status: She is alert and oriented to person, place, and time.  Psychiatric:        Behavior: Behavior normal.        Thought Content: Thought content normal.     ED Results / Procedures / Treatments   Labs (all labs ordered are listed, but only abnormal results are displayed) Labs Reviewed - No data to display  EKG None  Radiology No results found.  Procedures .Marland KitchenIncision and Drainage  Date/Time: 05/02/2020 9:55 PM Performed by: Duanne Guess, PA-C Authorized by: Duanne Guess, PA-C   Consent:    Consent obtained:  Verbal   Consent given by:  Patient   Risks discussed:  Incomplete drainage Location:    Type:  Abscess   Size:  2 cm   Location:  Lower extremity   Lower extremity location:  Leg   Leg location:  L upper leg Pre-procedure details:    Skin preparation:  Betadine Anesthesia (see MAR for exact dosages):    Anesthesia method:  Local infiltration   Local anesthetic:  Lidocaine 1% WITH epi Procedure type:    Complexity:  Simple Procedure details:    Needle aspiration: no     Incision types:  Stab incision   Incision depth:  Dermal   Scalpel blade:  11   Wound management:  Probed and deloculated   Drainage:  Bloody and purulent   Drainage amount:  Moderate   Packing materials:  1/4 in iodoform gauze Post-procedure details:    Patient tolerance of procedure:  Tolerated well, no  immediate complications   (including critical care time)  Medications Ordered in ED Medications  clindamycin (CLEOCIN) capsule 300 mg (300 mg Oral Given 05/02/20 2026)  lidocaine-EPINEPHrine (XYLOCAINE W/EPI) 1 %-1:100000 (with pres) injection 30 mL (30 mLs Infiltration Given by Other 05/02/20 2140)  HYDROcodone-acetaminophen (NORCO/VICODIN) 5-325 MG  per tablet 1 tablet (1 tablet Oral Given 05/02/20 2140)    ED Course  I have reviewed the triage vital signs and the nursing notes.  Pertinent labs & imaging results that were available during my care of the patient were reviewed by me and considered in my medical decision making (see chart for details).    MDM Rules/Calculators/A&P                          24 year old female with left groin abscess.  Patient tolerated incision and drainage well.  Moderate amount of copious purulent drainage expressed.  Iodoform packing applied along with a dressing.  Patient will finish her doxycycline prescription.  She will be given a prescription for Norco for pain.  She will follow-up with PCP ER or urgent care in 2 days for wound check and packing removal.  She understands signs and symptoms return to the ER sooner for. Final Clinical Impression(s) / ED Diagnoses Final diagnoses:  Abscess of left groin    Rx / DC Orders ED Discharge Orders    None       Renata Caprice 05/02/20 2210    Vanessa Bibo, MD 05/03/20 1537

## 2020-05-02 NOTE — ED Triage Notes (Signed)
Pt reports abscess to her left thigh for the past 3-4 days.

## 2020-05-02 NOTE — Discharge Instructions (Addendum)
Please follow-up with urgent care, walk-in clinic, PCP or ER in 2 days for packing removal and wound check.  Complete doxycycline prescription.  You may use Norco for moderate to severe pain.  Tylenol for mild pain.  Return to the ER for any fevers increasing pain swelling.

## 2021-02-05 ENCOUNTER — Emergency Department
Admission: EM | Admit: 2021-02-05 | Discharge: 2021-02-05 | Disposition: A | Discharge status: Home / Self Care | Payer: 59 | Attending: Emergency Medicine | Admitting: Emergency Medicine

## 2021-02-05 ENCOUNTER — Other Ambulatory Visit: Payer: Self-pay

## 2021-02-05 ENCOUNTER — Encounter: Payer: Self-pay | Admitting: *Deleted

## 2021-02-05 DIAGNOSIS — E1021 Type 1 diabetes mellitus with diabetic nephropathy: Secondary | ICD-10-CM | POA: Diagnosis not present

## 2021-02-05 DIAGNOSIS — E1065 Type 1 diabetes mellitus with hyperglycemia: Secondary | ICD-10-CM | POA: Insufficient documentation

## 2021-02-05 DIAGNOSIS — I10 Essential (primary) hypertension: Secondary | ICD-10-CM | POA: Insufficient documentation

## 2021-02-05 DIAGNOSIS — Z7982 Long term (current) use of aspirin: Secondary | ICD-10-CM | POA: Insufficient documentation

## 2021-02-05 DIAGNOSIS — L03012 Cellulitis of left finger: Secondary | ICD-10-CM | POA: Insufficient documentation

## 2021-02-05 DIAGNOSIS — Z794 Long term (current) use of insulin: Secondary | ICD-10-CM | POA: Diagnosis not present

## 2021-02-05 DIAGNOSIS — Z79899 Other long term (current) drug therapy: Secondary | ICD-10-CM | POA: Insufficient documentation

## 2021-02-05 LAB — CBG MONITORING, ED: Glucose-Capillary: 181 mg/dL — ABNORMAL HIGH (ref 70–99)

## 2021-02-05 MED ORDER — LIDOCAINE HCL (PF) 1 % IJ SOLN
5.0000 mL | Freq: Once | INTRAMUSCULAR | Status: AC
  Administered 2021-02-05: 5 mL via INTRADERMAL
  Filled 2021-02-05: qty 5

## 2021-02-05 MED ORDER — ACETAMINOPHEN 500 MG PO TABS
1000.0000 mg | ORAL_TABLET | Freq: Once | ORAL | Status: AC
  Administered 2021-02-05: 1000 mg via ORAL
  Filled 2021-02-05: qty 2

## 2021-02-05 NOTE — ED Provider Notes (Signed)
[facility] Emergency Department Provider Note  ____________________________________________   Event Date/Time   First MD Initiated Contact with Patient 02/05/21 [phone]     (approximate)  I have reviewed the triage vital signs and the nursing notes.   HISTORY  Chief Complaint No chief complaint on file.    HPI EMONEE AVE is a 25 y.o. female with history of type 1 diabetes, hypertension who presents to the emergency department with complaints of paronychia to the left third digit x1 day.  She is left-hand dominant.  Wound has not been draining.  No fevers, nausea, vomiting or diarrhea.  States her sugars have been running in the 300s.        Past Medical History:  Diagnosis Date  . Diabetes mellitus without complication (HCC)    Type 1 DM  . Essential hypertension   . Hypertension 03/04/2013  . Neurologic disorder    Both feet  . Recurrent UTI     Patient Active Problem List   Diagnosis Date Noted  . Labor and delivery indication for care or intervention 02/13/2020  . Pyelonephritis affecting pregnancy in first trimester 11/01/2019  . Supervision of high risk pregnancy, antepartum 11/01/2019  . Depression 10/27/2019  . Diabetes mellitus ([place]) 10/27/2019  . History of chlamydia infection 10/27/2019  . History of depression 10/27/2019  . Vitamin D deficiency 10/27/2019  . Liver function abnormality 08/15/2018  . Microalbuminuric diabetic nephropathy ([place]) 08/15/2018  . High anion gap metabolic acidosis   . AKI (acute kidney injury) ([place])   . Abdominal pain   . Pyelonephritis 07/30/2016  . Sepsis ([place]) 06/08/2016  . DKA (diabetic ketoacidoses) 09/09/2015  . History of gonorrhea 08/13/2015  . Intrauterine device 05/03/2015  . Neuropathy 04/13/2013  . Weight loss, non-intentional 04/13/2013  . Depression, major, recurrent, moderate ([place]) 03/06/2013  . Hypertension 03/04/2013    Past Surgical History:  Procedure Laterality Date  .  abscess removal     excision of bartholin cyst  . Nexplanon  01/2011    Prior to Admission medications   Medication Sig Start Date End Date Taking? Authorizing Provider  aspirin EC 81 MG tablet Take 81 mg by mouth daily.    [provider]  HYDROcodone-acetaminophen (NORCO) 5-325 MG tablet Take 1 tablet by mouth every 4 (four) hours as needed for moderate pain. 05/02/20   Duanne Guess, PA-C  insulin glargine (LANTUS) 100 UNIT/ML injection Inject 14 Units into the skin 2 (two) times daily. 14u in morning and 12u at night    [provider]  insulin lispro (HUMALOG) 100 UNIT/ML injection Inject into the skin 3 (three) times daily before meals.    [provider]  labetalol (NORMODYNE) 200 MG tablet Take 200 mg by mouth 2 (two) times daily.    [provider]  Multiple Vitamin (MULTIVITAMIN) capsule Take 1 capsule by mouth daily.    [provider]    Allergies [name].  Family History  Problem Relation Age of Onset  . Breast cancer Mother 11  . Lung cancer Maternal Grandmother   . Diabetes type II Paternal Grandmother     Social History Social History   Tobacco Use  . Smoking status: Never Smoker  . Smokeless tobacco: Never Used  Vaping Use  . Vaping Use: Never used  Substance Use Topics  . Alcohol use: Not Currently    Alcohol/week: 2.0 standard drinks    Types: 2 Shots of liquor per week  .  Drug use: No    Review of Systems Constitutional: No fever. Eyes: No visual changes. ENT: No sore throat. Cardiovascular: Denies chest pain. Respiratory: Denies shortness of breath. Gastrointestinal: No nausea, vomiting, diarrhea. Genitourinary: Negative for dysuria. Musculoskeletal: Negative for back pain. Skin: Negative for rash. Neurological: Negative for focal weakness or numbness.  ____________________________________________   PHYSICAL EXAM:  VITAL SIGNS: ED Triage Vitals  Enc Vitals Group      BP 02/05/21 0154 (!) 139/99     Pulse Rate 02/05/21 0154 95     Resp 02/05/21 0154 18     Temp 02/05/21 0154 97.6 F (36.4 C)     Temp Source 02/05/21 0154 Oral     SpO2 02/05/21 0154 99 %     Weight 02/05/21 0153 135 lb (61.2 kg)     Height 02/05/21 0153 '5\' 6"'$  (1.676 m)     Head Circumference --      Peak Flow --      Pain Score 02/05/21 0153 7     Pain Loc --      Pain Edu? --      Excl. in [place]? --    CONSTITUTIONAL: Alert and oriented and responds appropriately to questions. Well-appearing; well-nourished HEAD: Normocephalic EYES: Conjunctivae clear, pupils appear equal, EOM appear intact ENT: normal nose; moist mucous membranes NECK: Supple, normal ROM CARD: RRR; S1 and S2 appreciated; no murmurs, no clicks, no rubs, no gallops RESP: Normal chest excursion without splinting or tachypnea; breath sounds clear and equal bilaterally; no wheezes, no rhonchi, no rales, no hypoxia or respiratory distress, speaking full sentences ABD/GI: Normal bowel sounds; non-distended; soft, non-tender, no rebound, no guarding, no peritoneal signs, no hepatosplenomegaly BACK: The back appears normal EXT: Normal ROM in all joints; no deformity noted, no edema; no cyanosis; paronychia to the left third digit, 2+ left radial pulse; no vesicular lesions, no tenderness at the pulp of the finger, no tenderness over the flexor tendon SKIN: Normal color for age and race; warm; no rash on exposed skin NEURO: Moves all extremities equally PSYCH: The patient's mood and manner are appropriate.  ____________________________________________   LABS (all labs ordered are listed, but only abnormal results are displayed)  Labs Reviewed  CBG MONITORING, ED   ____________________________________________  EKG  None ____________________________________________  RADIOLOGY I, Joyice Magda, personally viewed and evaluated these images (plain radiographs) as part of my medical decision making, as well as reviewing  the written report by the radiologist.  ED MD interpretation: None  Official radiology report(s): No results found.  ____________________________________________   PROCEDURES  Procedure(s) performed (including Critical Care):  Procedures  INCISION AND DRAINAGE Performed by: Cyril Mourning Zyire Eidson Consent: Verbal consent obtained. Risks and benefits: risks, benefits and alternatives were discussed Type: abscess  Body area: Left third finger  Anesthesia: local infiltration  Incision was made with a scalpel.  Local anesthetic: lidocaine 1% without epinephrine  Anesthetic total: 2 ml  Complexity: Simple  Drainage: purulent  Drainage amount: Small  Patient tolerance: Patient tolerated poorly and procedure was terminated at patient request.       ____________________________________________   INITIAL IMPRESSION / ASSESSMENT AND PLAN / ED COURSE  As part of my medical decision making, I reviewed the following data within the [facility] notes reviewed and incorporated, Labs reviewed , Old chart reviewed and Notes from prior ED visits         Patient here with paronychia.  Drained at bedside.  Patient tolerated procedure poorly.  Small  amount of pus was drained.  I do not feel she needs to be on antibiotics.  No surrounding cellulitis.  No sign of herpetic whitlow, felon, flexor tenosynovitis.  Will apply dressing to this area.  Discussed wound care instructions, return precautions.  Recommended alternating Tylenol and Motrin for pain.  Will check her blood sugar prior to discharge.  ED PROGRESS  Patient's blood sugar is 181.  I feel she is safe to be discharged home.   At this time, I do not feel there is any life-threatening condition present. I have reviewed, interpreted and discussed all results (EKG, imaging, lab, urine as appropriate) and exam findings with patient/family. I have reviewed nursing notes and appropriate previous records.  I  feel the patient is safe to be discharged home without further emergent workup and can continue workup as an outpatient as needed. Discussed usual and customary return precautions. Patient/family verbalize understanding and are comfortable with this plan.  Outpatient follow-up has been provided as needed. All questions have been answered.  ____________________________________________   FINAL CLINICAL IMPRESSION(S) / ED DIAGNOSES  Final diagnoses:  Paronychia of left middle finger     ED Discharge Orders    None      *Please note:  Emma Thomas was evaluated in Emergency Department on 02/05/2021 for the symptoms described in the history of present illness. She was evaluated in the context of the global COVID-19 pandemic, which necessitated consideration that the patient might be at risk for infection with the SARS-CoV-2 virus that causes COVID-19. Institutional protocols and algorithms that pertain to the evaluation of patients at risk for COVID-19 are in a state of rapid change based on information released by regulatory bodies including the CDC and federal and state organizations. These policies and algorithms were followed during the patient's care in the ED.  Some ED evaluations and interventions may be delayed as a result of limited staffing during and the pandemic.*   Note:  This document was prepared using Dragon voice recognition software and may include unintentional dictation errors.   Tyrann Donaho, Delice Bison, DO 02/05/21 [phone]

## 2021-02-05 NOTE — Discharge Instructions (Addendum)
You may alternate Tylenol 1000 mg every 6 hours as needed for pain, fever and Ibuprofen 800 mg every 8 hours as needed for pain, fever.  Please take Ibuprofen with food.  Do not take more than 4000 mg of Tylenol (acetaminophen) in a 24 hour period.  I recommend that you soak your finger in warm water multiple times a day for 10 to 15 minutes at a time to help drain infection.  You do not need antibiotics after incision and drainage of this area.

## 2021-02-05 NOTE — ED Triage Notes (Signed)
Pt has left middle  finger pain.  States infected nailbed for 1 day with tenderness and swelling.  No known injury to finger.

## 2021-02-05 NOTE — ED Notes (Signed)
Patient refused dressing on finger.

## 2021-08-21 ENCOUNTER — Ambulatory Visit
Admission: RE | Admit: 2021-08-21 | Discharge: 2021-08-21 | Disposition: A | Discharge status: Home / Self Care | Payer: 59 | Source: Ambulatory Visit | Attending: Emergency Medicine | Admitting: Emergency Medicine

## 2021-08-21 ENCOUNTER — Other Ambulatory Visit: Payer: Self-pay

## 2021-08-21 VITALS — BP 138/89 | HR 97 | Temp 99.6°F | Resp 16

## 2021-08-21 DIAGNOSIS — L02416 Cutaneous abscess of left lower limb: Secondary | ICD-10-CM

## 2021-08-21 MED ORDER — DOXYCYCLINE HYCLATE 100 MG PO CAPS: 100.0000 mg | ORAL_CAPSULE | Freq: Two times a day (BID) | ORAL | 0 refills | Status: DC

## 2021-08-21 NOTE — ED Provider Notes (Signed)
Chief Complaint   Chief Complaint  Patient presents with   Insect Bite     Subjective, HPI  Emma Thomas is a very pleasant 25 y.o. female who presents with a pimple to her left hip that started about 3 days ago.  Patient reports that the area has become more large and firm.  Patient reports that the area is draining.  Patient reports that overall she does not feel well.  She reports a history of abscesses.  No fever, chills or vomiting.  History obtained from patient.   Patient's problem list, past medical and social history, medications, and allergies were reviewed by me and updated in Epic.    ROS  See HPI.  Objective   Vitals:   08/21/21 1916  BP: 138/89  Pulse: 97  Resp: 16  Temp: 99.6 F (37.6 C)  SpO2: 98%    Vital signs and nursing note reviewed.  General: Appears well-developed and well-nourished. No acute distress.  HEENT: Normocephalic, atraumatic, hearing grossly intact. EOMI, no drainage. No rhinorrhea. Moist mucous membranes.  Neck: Normal range of motion, neck is supple.  Cardiovascular: Normal rate.  Pulm/Chest: No respiratory distress.   Musculoskeletal: No joint deformity, normal range of motion.  Skin: Erythematous abscessed area noted to left hip region with surrounding erythema.  Entire area with erythema included measures approximately 2 cm x 2 cm.  Area is exquisitely tender and is currently draining purulence.  Data  No results found for any visits on 08/21/21.    Assessment & Plan  1. Abscess of left hip - doxycycline (VIBRAMYCIN) 100 MG capsule; Take 1 capsule (100 mg total) by mouth 2 (two) times daily.  Dispense: 20 capsule; Refill: 0  25 y.o. female presents with a pimple to her left hip that started about 3 days ago.  Patient reports that the area has become more large and firm.  Patient reports that the area is draining.  Patient reports that overall she does not feel well.  She reports a history of abscesses.  No fever, chills or  vomiting.  Given symptoms along with assessment findings, likely abscess.  Rx doxycycline to the patient's preferred pharmacy and advised about home treatment and care to include Tylenol along with warm compresses.  Expect the area to gradually heal over the next 7 to 10 days.  Watch for worsening signs of infection to include increased redness, swelling, red streaking, fever, worsening pain.  If you begin to have the symptoms please return to clinic for reevaluation.  Patient verbalized understanding and agreed with plan.  Patient stable upon discharge.  Return as needed.  Plan:   Discharge Instructions      Take antibiotic (doxycycline) with food as prescribed. Apply warm compresses to promote drainage. Keep covered until wound closes. Expect it to gradually improve over 7 - 10 days.  Watch for signs of worsening infection: increased redness, swelling, red streaking, fever, or worsening pain. If these symptoms are present, or if you have new or concerning symptoms, return to clinic immediately for a recheck.          [name],  08/21/21 1934

## 2021-08-21 NOTE — Discharge Instructions (Addendum)
Take antibiotic (doxycycline) with food as prescribed. Apply warm compresses to promote drainage. Keep covered until wound closes. Expect it to gradually improve over 7 - 10 days.  Watch for signs of worsening infection: increased redness, swelling, red streaking, fever, or worsening pain. If these symptoms are present, or if you have new or concerning symptoms, return to clinic immediately for a recheck.

## 2021-08-21 NOTE — ED Triage Notes (Signed)
Patient reports a pimple to left hip 3 days ago.  Site has become larger, more firm.  Overall not feeling well.  Patient has a history of abscess

## 2021-10-01 ENCOUNTER — Encounter: Payer: Self-pay | Admitting: Emergency Medicine

## 2021-10-01 ENCOUNTER — Emergency Department
Admission: EM | Admit: 2021-10-01 | Discharge: 2021-10-01 | Disposition: A | Discharge status: Home / Self Care | Payer: Commercial Managed Care - PPO | Attending: Emergency Medicine | Admitting: Emergency Medicine

## 2021-10-01 ENCOUNTER — Other Ambulatory Visit: Payer: Self-pay

## 2021-10-01 DIAGNOSIS — Z79899 Other long term (current) drug therapy: Secondary | ICD-10-CM | POA: Diagnosis not present

## 2021-10-01 DIAGNOSIS — Z794 Long term (current) use of insulin: Secondary | ICD-10-CM | POA: Diagnosis not present

## 2021-10-01 DIAGNOSIS — L0211 Cutaneous abscess of neck: Secondary | ICD-10-CM | POA: Diagnosis present

## 2021-10-01 DIAGNOSIS — I10 Essential (primary) hypertension: Secondary | ICD-10-CM | POA: Insufficient documentation

## 2021-10-01 DIAGNOSIS — Z7982 Long term (current) use of aspirin: Secondary | ICD-10-CM | POA: Insufficient documentation

## 2021-10-01 DIAGNOSIS — L0291 Cutaneous abscess, unspecified: Secondary | ICD-10-CM

## 2021-10-01 DIAGNOSIS — E1021 Type 1 diabetes mellitus with diabetic nephropathy: Secondary | ICD-10-CM | POA: Insufficient documentation

## 2021-10-01 DIAGNOSIS — E1010 Type 1 diabetes mellitus with ketoacidosis without coma: Secondary | ICD-10-CM | POA: Diagnosis not present

## 2021-10-01 LAB — CBG MONITORING, ED: Glucose-Capillary: 141 mg/dL — ABNORMAL HIGH (ref 70–99)

## 2021-10-01 MED ORDER — OXYCODONE-ACETAMINOPHEN 5-325 MG PO TABS
1.0000 | ORAL_TABLET | Freq: Once | ORAL | Status: AC
  Administered 2021-10-01: 1 via ORAL
  Filled 2021-10-01: qty 1

## 2021-10-01 MED ORDER — OXYCODONE-ACETAMINOPHEN 5-325 MG PO TABS: 1.0000 | ORAL_TABLET | ORAL | 0 refills | Status: DC | PRN

## 2021-10-01 MED ORDER — SULFAMETHOXAZOLE-TRIMETHOPRIM 800-160 MG PO TABS
1.0000 | ORAL_TABLET | Freq: Once | ORAL | Status: AC
  Administered 2021-10-01: 1 via ORAL
  Filled 2021-10-01: qty 1

## 2021-10-01 MED ORDER — LIDOCAINE HCL (PF) 1 % IJ SOLN
INTRAMUSCULAR | Status: AC
  Filled 2021-10-01: qty 5

## 2021-10-01 MED ORDER — LIDOCAINE HCL (PF) 1 % IJ SOLN
5.0000 mL | Freq: Once | INTRAMUSCULAR | Status: AC
  Administered 2021-10-01: 5 mL

## 2021-10-01 MED ORDER — SULFAMETHOXAZOLE-TRIMETHOPRIM 800-160 MG PO TABS: 1.0000 | ORAL_TABLET | Freq: Two times a day (BID) | ORAL | 0 refills | Status: AC

## 2021-10-01 MED ORDER — LIDOCAINE-PRILOCAINE 2.5-2.5 % EX CREA
TOPICAL_CREAM | Freq: Once | CUTANEOUS | Status: AC
  Filled 2021-10-01: qty 5

## 2021-10-01 NOTE — ED Triage Notes (Signed)
Pt to ED from home c/o abscess to right neck, denies drainage or fever, states hx of abscesses but none to neck.  Pt speaking in complete and coherent sentences, painful to swallow but maintaining secretions.

## 2021-10-01 NOTE — ED Provider Notes (Signed)
[facility] Emergency Department Provider Note  ____________________________________________  Time seen: Approximately 5:03 AM  I have reviewed the triage vital signs and the nursing notes.   HISTORY  Chief Complaint Abscess   HPI Emma Thomas is a 25 y.o. female history of type 1 diabetes and hypertension who presents for evaluation of abscess.  Patient reports a prior history of abscesses.  This 1 started yesterday located in the right side of her neck.  She is complaining of moderate sharp constant pain worse with palpation.  No fever or chills, no nausea or vomiting.  Past Medical History:  Diagnosis Date   Diabetes mellitus without complication ([place])    Type 1 DM   Essential hypertension    Hypertension 03/04/2013   Neurologic disorder    Both feet   Recurrent UTI     Patient Active Problem List   Diagnosis Date Noted   Labor and delivery indication for care or intervention 02/13/2020   Pyelonephritis affecting pregnancy in first trimester 11/01/2019   Supervision of high risk pregnancy, antepartum 11/01/2019   Depression 10/27/2019   Diabetes mellitus ([place]) 10/27/2019   History of chlamydia infection 10/27/2019   History of depression 10/27/2019   Vitamin D deficiency 10/27/2019   Liver function abnormality 08/15/2018   Microalbuminuric diabetic nephropathy ([place]) 08/15/2018   High anion gap metabolic acidosis    AKI (acute kidney injury) ([place])    Abdominal pain    Pyelonephritis 07/30/2016   Sepsis ([place]) 06/08/2016   DKA (diabetic ketoacidoses) 09/09/2015   History of gonorrhea 08/13/2015   Intrauterine device 05/03/2015   Neuropathy 04/13/2013   Weight loss, non-intentional 04/13/2013   Depression, major, recurrent, moderate ([place]) 03/06/2013   Hypertension 03/04/2013    Past Surgical History:  Procedure Laterality Date   abscess removal     excision of bartholin cyst   Nexplanon  01/2011    Prior to Admission  medications   Medication Sig Start Date End Date Taking? Authorizing Provider  oxyCODONE-acetaminophen (PERCOCET) 5-325 MG tablet Take 1 tablet by mouth every 4 (four) hours as needed. 10/01/21  Yes Alfred Levins, [place], MD  sulfamethoxazole-trimethoprim (BACTRIM DS) 800-160 MG tablet Take 1 tablet by mouth 2 (two) times daily for 5 days. 10/01/21 10/06/21 Yes Blanca Carreon, [place], MD  aspirin EC 81 MG tablet Take 81 mg by mouth daily. Patient not taking: Reported on 08/21/2021    [provider]  doxycycline (VIBRAMYCIN) 100 MG capsule Take 1 capsule (100 mg total) by mouth 2 (two) times daily. 08/21/21   Boddu, Erasmo Downer, FNP  HYDROcodone-acetaminophen (NORCO) 5-325 MG tablet Take 1 tablet by mouth every 4 (four) hours as needed for moderate pain. Patient not taking: Reported on 08/21/2021 05/02/20   Duanne Guess, PA-C  insulin glargine (LANTUS) 100 UNIT/ML injection Inject 14 Units into the skin 2 (two) times daily. 14u in morning and 12u at night    [provider]  insulin lispro (HUMALOG) 100 UNIT/ML injection Inject into the skin 3 (three) times daily before meals.    [provider]  labetalol (NORMODYNE) 200 MG tablet Take 200 mg by mouth 2 (two) times daily. Patient not taking: Reported on 08/21/2021    [provider]  Multiple Vitamin (MULTIVITAMIN) capsule Take 1 capsule by mouth daily.    [provider]    Allergies [name].  Family History  Problem Relation Age of Onset   Breast cancer Mother 48   Lung cancer Maternal Grandmother  Diabetes type II Paternal Grandmother     Social History Social History   Tobacco Use   Smoking status: Never   Smokeless tobacco: Never  Vaping Use   Vaping Use: Never used  Substance Use Topics   Alcohol use: Not Currently    Alcohol/week: 2.0 standard drinks    Types: 2 Shots of liquor per week   Drug use: No    Review of Systems  Constitutional: Negative for  fever. Eyes: Negative for visual changes. ENT: Negative for sore throat. Neck: No neck pain  Cardiovascular: Negative for chest pain. Respiratory: Negative for shortness of breath. Gastrointestinal: Negative for abdominal pain, vomiting or diarrhea. Genitourinary: Negative for dysuria. Musculoskeletal: Negative for back pain. Skin: Negative for rash. + neck abscess Neurological: Negative for headaches, weakness or numbness. Psych: No SI or HI  ____________________________________________   PHYSICAL EXAM:  VITAL SIGNS: ED Triage Vitals  Enc Vitals Group     BP 10/01/21 0354 (!) 141/98     Pulse Rate 10/01/21 0354 (!) 101     Resp 10/01/21 0354 18     Temp 10/01/21 0354 98.6 F (37 C)     Temp Source 10/01/21 0354 Oral     SpO2 10/01/21 0354 97 %     Weight 10/01/21 0353 139 lb (63 kg)     Height 10/01/21 0353 5\' 6"  (1.676 m)     Head Circumference --      Peak Flow --      Pain Score 10/01/21 0357 8     Pain Loc --      Pain Edu? --      Excl. in [place]? --     Constitutional: Alert and oriented. Well appearing and in no apparent distress. HEENT:      Head: Normocephalic and atraumatic.         Eyes: Conjunctivae are normal. Sclera is non-icteric.       Mouth/Throat: Mucous membranes are moist.       Neck: Supple with no signs of meningismus. There is a 1cm abscess on the R anterior neck with minimal overlying erythema and warmth Cardiovascular: Regular rate and rhythm.  Respiratory: Normal respiratory effort.  Musculoskeletal:  No edema, cyanosis, or erythema of extremities. Neurologic: Normal speech and language. Face is symmetric. Moving all extremities. No gross focal neurologic deficits are appreciated. Skin: Skin is warm, dry and intact. No rash noted. Psychiatric: Mood and affect are normal. Speech and behavior are normal.  ____________________________________________   LABS (all labs ordered are listed, but only abnormal results are displayed)  Labs Reviewed   CBG MONITORING, ED - Abnormal; Notable for the following components:      Result Value   Glucose-Capillary 141 (*)    All other components within normal limits   ____________________________________________  EKG  none  ____________________________________________  RADIOLOGY  none  ____________________________________________   PROCEDURES  Procedure(s) performed:yes .Marland KitchenIncision and Drainage  Date/Time: 10/01/2021 5:09 AM Performed by: Rudene Re, MD Authorized by: Rudene Re, MD   Consent:    Consent obtained:  Verbal   Consent given by:  Patient   Risks discussed:  Bleeding, infection, incomplete drainage, pain and damage to other organs   Alternatives discussed:  Alternative treatment, delayed treatment and observation Location:    Type:  Abscess   Size:  1   Location: neck. Pre-procedure details:    Skin preparation:  Povidone-iodine Sedation:    Sedation type:  None Anesthesia:    Anesthesia method:  Local infiltration  and topical application   Topical anesthetic:  EMLA cream   Local anesthetic:  Lidocaine 1% w/o epi Procedure type:    Complexity:  Simple Procedure details:    Incision types:  Stab incision   Wound management:  Probed and deloculated   Drainage:  Bloody   Drainage amount:  Scant   Wound treatment:  Wound left open   Packing materials:  None Post-procedure details:    Procedure completion:  Tolerated well, no immediate complications   Critical Care performed:  None ____________________________________________   INITIAL IMPRESSION / ASSESSMENT AND PLAN / ED COURSE  25 y.o. female history of type 1 diabetes and hypertension who presents for evaluation of abscess.  Patient with a small right anterior neck abscess which was drained per procedure note above.  No signs of sepsis or systemic infection.  Patient was put on a short course of Bactrim.  Discussed wound care and close follow-up with PCP.       _____________________________________________ Please note:  Patient was evaluated in Emergency Department today for the symptoms described in the history of present illness. Patient was evaluated in the context of the global COVID-19 pandemic, which necessitated consideration that the patient might be at risk for infection with the SARS-CoV-2 virus that causes COVID-19. Institutional protocols and algorithms that pertain to the evaluation of patients at risk for COVID-19 are in a state of rapid change based on information released by regulatory bodies including the CDC and federal and state organizations. These policies and algorithms were followed during the patient's care in the ED.  Some ED evaluations and interventions may be delayed as a result of limited staffing during the pandemic.   [place] Controlled Substance Database was reviewed by me. ____________________________________________   FINAL CLINICAL IMPRESSION(S) / ED DIAGNOSES   Final diagnoses:  Abscess      NEW MEDICATIONS STARTED DURING THIS VISIT:  ED Discharge Orders          Ordered    sulfamethoxazole-trimethoprim (BACTRIM DS) 800-160 MG tablet  2 times daily        10/01/21 0502    oxyCODONE-acetaminophen (PERCOCET) 5-325 MG tablet  Every 4 hours PRN        10/01/21 0502             Note:  This document was prepared using Dragon voice recognition software and may include unintentional dictation errors.    Rudene Re, MD 10/01/21 [phone]

## 2021-10-01 NOTE — ED Notes (Signed)
Dr. Alfred Levins in triage 2 to see patient.

## 2021-10-01 NOTE — Discharge Instructions (Signed)
You have been seen in the Emergency Department (ED) today for an abscess. A skin abscess is a bacterial infection that forms a pocket of pus. A boil is a kind of skin abscess. This was drained in the ED. If you were prescribed antibiotics, please make sure to take it fully as prescribed.  Follow-up with your doctor in 24-48 hours for a re-check. If you do not have a doctor you may return to the ER for a re-check.  How can you care for yourself at home?  Apply warm and dry compresses, a heating pad set on low, or a hot water bottle 3 or 4 times a day for pain. Keep a cloth between the heat source and your skin.  If your doctor prescribed antibiotics, take them as directed. Do not stop taking them just because you feel better. You need to take the full course of antibiotics.  Take pain medicines exactly as directed.  If the doctor gave you a prescription medicine for pain, take it as prescribed.  If you are not taking a prescription pain medicine, ask your doctor if you can take an over-the-counter medicine. Keep your bandage clean and dry. Change the bandage whenever it gets wet or dirty, or at least one time a day.  If the abscess was packed with gauze:  Keep follow-up appointments to have the gauze changed or removed. If the doctor instructed you to remove the gauze, gently pull out all of the gauze when your doctor tells you to.  After the gauze is removed, soak the area in warm water for 15 to 20 minutes 2 times a day, until the wound closes.  When should you call for help?  Call your doctor now or seek immediate medical care if:  You have signs of worsening infection, such as:  Increased pain, swelling, warmth, or redness.  Red streaks leading from the infected skin.  Pus draining from the wound.  A fever. Uncontrolled nausea and vomiting Watch closely for changes in your health, and be sure to contact your doctor if:  You do not get better as expected.   When should you call for help?   Call your doctor now or seek immediate medical care if:  You have signs of worsening infection, such as:  Increased pain, swelling, warmth, or redness.  Red streaks leading from the infected skin.  Pus draining from the wound.  A fever. Uncontrolled nausea and vomiting Watch closely for changes in your health, and be sure to contact your doctor if:  You do not get better as expected

## 2021-12-26 DIAGNOSIS — N179 Acute kidney failure, unspecified: Secondary | ICD-10-CM | POA: Insufficient documentation

## 2022-04-19 ENCOUNTER — Other Ambulatory Visit: Payer: Self-pay

## 2022-04-19 ENCOUNTER — Inpatient Hospital Stay
Admission: EM | Admit: 2022-04-19 | Discharge: 2022-04-27 | DRG: 674 | Disposition: A | Discharge status: Home / Self Care | Payer: Commercial Managed Care - PPO | Attending: Internal Medicine | Admitting: Internal Medicine

## 2022-04-19 ENCOUNTER — Emergency Department: Payer: Commercial Managed Care - PPO

## 2022-04-19 DIAGNOSIS — F331 Major depressive disorder, recurrent, moderate: Secondary | ICD-10-CM | POA: Diagnosis present

## 2022-04-19 DIAGNOSIS — N179 Acute kidney failure, unspecified: Secondary | ICD-10-CM | POA: Diagnosis present

## 2022-04-19 DIAGNOSIS — E8721 Acute metabolic acidosis: Secondary | ICD-10-CM | POA: Diagnosis not present

## 2022-04-19 DIAGNOSIS — E1022 Type 1 diabetes mellitus with diabetic chronic kidney disease: Secondary | ICD-10-CM | POA: Diagnosis present

## 2022-04-19 DIAGNOSIS — Z992 Dependence on renal dialysis: Secondary | ICD-10-CM

## 2022-04-19 DIAGNOSIS — Z1152 Encounter for screening for COVID-19: Secondary | ICD-10-CM

## 2022-04-19 DIAGNOSIS — Z833 Family history of diabetes mellitus: Secondary | ICD-10-CM

## 2022-04-19 DIAGNOSIS — E1040 Type 1 diabetes mellitus with diabetic neuropathy, unspecified: Secondary | ICD-10-CM | POA: Diagnosis present

## 2022-04-19 DIAGNOSIS — Z801 Family history of malignant neoplasm of trachea, bronchus and lung: Secondary | ICD-10-CM | POA: Diagnosis not present

## 2022-04-19 DIAGNOSIS — Z821 Family history of blindness and visual loss: Secondary | ICD-10-CM

## 2022-04-19 DIAGNOSIS — E10649 Type 1 diabetes mellitus with hypoglycemia without coma: Secondary | ICD-10-CM | POA: Diagnosis not present

## 2022-04-19 DIAGNOSIS — Z818 Family history of other mental and behavioral disorders: Secondary | ICD-10-CM | POA: Diagnosis not present

## 2022-04-19 DIAGNOSIS — K529 Noninfective gastroenteritis and colitis, unspecified: Secondary | ICD-10-CM

## 2022-04-19 DIAGNOSIS — N186 End stage renal disease: Secondary | ICD-10-CM | POA: Diagnosis present

## 2022-04-19 DIAGNOSIS — D631 Anemia in chronic kidney disease: Secondary | ICD-10-CM | POA: Diagnosis present

## 2022-04-19 DIAGNOSIS — R112 Nausea with vomiting, unspecified: Secondary | ICD-10-CM | POA: Diagnosis present

## 2022-04-19 DIAGNOSIS — E119 Type 2 diabetes mellitus without complications: Secondary | ICD-10-CM

## 2022-04-19 DIAGNOSIS — E1065 Type 1 diabetes mellitus with hyperglycemia: Secondary | ICD-10-CM | POA: Diagnosis present

## 2022-04-19 DIAGNOSIS — R1110 Vomiting, unspecified: Secondary | ICD-10-CM | POA: Diagnosis present

## 2022-04-19 DIAGNOSIS — I10 Essential (primary) hypertension: Secondary | ICD-10-CM | POA: Diagnosis present

## 2022-04-19 DIAGNOSIS — Z87440 Personal history of urinary (tract) infections: Secondary | ICD-10-CM | POA: Diagnosis not present

## 2022-04-19 DIAGNOSIS — E876 Hypokalemia: Secondary | ICD-10-CM | POA: Diagnosis not present

## 2022-04-19 DIAGNOSIS — E861 Hypovolemia: Secondary | ICD-10-CM | POA: Diagnosis present

## 2022-04-19 DIAGNOSIS — N2581 Secondary hyperparathyroidism of renal origin: Secondary | ICD-10-CM | POA: Diagnosis present

## 2022-04-19 DIAGNOSIS — I120 Hypertensive chronic kidney disease with stage 5 chronic kidney disease or end stage renal disease: Secondary | ICD-10-CM | POA: Diagnosis present

## 2022-04-19 DIAGNOSIS — Z79899 Other long term (current) drug therapy: Secondary | ICD-10-CM

## 2022-04-19 DIAGNOSIS — Z803 Family history of malignant neoplasm of breast: Secondary | ICD-10-CM

## 2022-04-19 DIAGNOSIS — N184 Chronic kidney disease, stage 4 (severe): Secondary | ICD-10-CM | POA: Diagnosis present

## 2022-04-19 DIAGNOSIS — T471X5A Adverse effect of other antacids and anti-gastric-secretion drugs, initial encounter: Secondary | ICD-10-CM | POA: Diagnosis not present

## 2022-04-19 DIAGNOSIS — Z794 Long term (current) use of insulin: Secondary | ICD-10-CM

## 2022-04-19 DIAGNOSIS — Z825 Family history of asthma and other chronic lower respiratory diseases: Secondary | ICD-10-CM

## 2022-04-19 DIAGNOSIS — I160 Hypertensive urgency: Secondary | ICD-10-CM | POA: Diagnosis present

## 2022-04-19 DIAGNOSIS — Z8249 Family history of ischemic heart disease and other diseases of the circulatory system: Secondary | ICD-10-CM

## 2022-04-19 DIAGNOSIS — A419 Sepsis, unspecified organism: Secondary | ICD-10-CM | POA: Diagnosis present

## 2022-04-19 HISTORY — DX: Disorder of kidney and ureter, unspecified: N28.9

## 2022-04-19 LAB — COMPREHENSIVE METABOLIC PANEL
ALT: 17 U/L (ref 0–44)
AST: 21 U/L (ref 15–41)
Albumin: 2.8 g/dL — ABNORMAL LOW (ref 3.5–5.0)
Alkaline Phosphatase: 52 U/L (ref 38–126)
Anion gap: 12 (ref 5–15)
BUN: 59 mg/dL — ABNORMAL HIGH (ref 6–20)
CO2: 17 mmol/L — ABNORMAL LOW (ref 22–32)
Calcium: 8.4 mg/dL — ABNORMAL LOW (ref 8.9–10.3)
Chloride: 112 mmol/L — ABNORMAL HIGH (ref 98–111)
Creatinine, Ser: 6.36 mg/dL — ABNORMAL HIGH (ref 0.44–1.00)
GFR, Estimated: 9 mL/min — ABNORMAL LOW (ref >60)
Glucose, Bld: 116 mg/dL — ABNORMAL HIGH (ref 70–99)
Potassium: 3.9 mmol/L (ref 3.5–5.1)
Sodium: 141 mmol/L (ref 135–145)
Total Bilirubin: 0.7 mg/dL (ref 0.3–1.2)
Total Protein: 7.4 g/dL (ref 6.5–8.1)

## 2022-04-19 LAB — CBC WITH DIFFERENTIAL/PLATELET
Abs Immature Granulocytes: 0.04 10*3/uL (ref 0.00–0.07)
Basophils Absolute: 0 10*3/uL (ref 0.0–0.1)
Basophils Relative: 0 %
Eosinophils Absolute: 0 10*3/uL (ref 0.0–0.5)
Eosinophils Relative: 0 %
HCT: 24 % — ABNORMAL LOW (ref 36.0–46.0)
Hemoglobin: 7.7 g/dL — ABNORMAL LOW (ref 12.0–15.0)
Immature Granulocytes: 0 %
Lymphocytes Relative: 10 %
Lymphs Abs: 1 10*3/uL (ref 0.7–4.0)
MCH: 28.4 pg (ref 26.0–34.0)
MCHC: 32.1 g/dL (ref 30.0–36.0)
MCV: 88.6 fL (ref 80.0–100.0)
Monocytes Absolute: 0.1 10*3/uL (ref 0.1–1.0)
Monocytes Relative: 1 %
Neutro Abs: 9.2 10*3/uL — ABNORMAL HIGH (ref 1.7–7.7)
Neutrophils Relative %: 89 %
Platelets: 326 10*3/uL (ref 150–400)
RBC: 2.71 MIL/uL — ABNORMAL LOW (ref 3.87–5.11)
RDW: 12.4 % (ref 11.5–15.5)
WBC: 10.4 10*3/uL (ref 4.0–10.5)
nRBC: 0 % (ref 0.0–0.2)

## 2022-04-19 LAB — LIPASE, BLOOD: Lipase: 21 U/L (ref 11–51)

## 2022-04-19 LAB — GLUCOSE, CAPILLARY: Glucose-Capillary: 193 mg/dL — ABNORMAL HIGH (ref 70–99)

## 2022-04-19 LAB — URINALYSIS, ROUTINE W REFLEX MICROSCOPIC
Bilirubin Urine: NEGATIVE
Glucose, UA: 150 mg/dL — AB
Ketones, ur: 20 mg/dL — AB
Leukocytes,Ua: NEGATIVE
Nitrite: NEGATIVE
Protein, ur: >=300 mg/dL — AB
Specific Gravity, Urine: 1.014 (ref 1.005–1.030)
pH: 7 (ref 5.0–8.0)

## 2022-04-19 LAB — HEMOGLOBIN A1C
Hgb A1c MFr Bld: 9.4 % — ABNORMAL HIGH (ref 4.8–5.6)
Mean Plasma Glucose: 223.08 mg/dL

## 2022-04-19 LAB — HCG, QUANTITATIVE, PREGNANCY: hCG, Beta Chain, Quant, S: 1 m[IU]/mL (ref <5)

## 2022-04-19 LAB — POC URINE PREG, ED: Preg Test, Ur: NEGATIVE

## 2022-04-19 LAB — CBG MONITORING, ED: Glucose-Capillary: 115 mg/dL — ABNORMAL HIGH (ref 70–99)

## 2022-04-19 LAB — MAGNESIUM: Magnesium: 2.4 mg/dL (ref 1.7–2.4)

## 2022-04-19 MED ORDER — ONDANSETRON HCL 4 MG/2ML IJ SOLN
4.0000 mg | Freq: Once | INTRAMUSCULAR | Status: AC
  Administered 2022-04-19: 4 mg via INTRAVENOUS
  Filled 2022-04-19: qty 2

## 2022-04-19 MED ORDER — ZOLPIDEM TARTRATE 5 MG PO TABS
5.0000 mg | ORAL_TABLET | Freq: Every evening | ORAL | Status: DC | PRN
  Administered 2022-04-19 – 2022-04-26 (×7): 5 mg via ORAL
  Filled 2022-04-19 (×7): qty 1

## 2022-04-19 MED ORDER — ONDANSETRON HCL 4 MG PO TABS: 4.0000 mg | ORAL_TABLET | Freq: Four times a day (QID) | ORAL | Status: DC | PRN

## 2022-04-19 MED ORDER — ACETAMINOPHEN 650 MG RE SUPP: 650.0000 mg | Freq: Four times a day (QID) | RECTAL | Status: DC | PRN

## 2022-04-19 MED ORDER — LACTATED RINGERS IV BOLUS
1000.0000 mL | Freq: Once | INTRAVENOUS | Status: AC
  Administered 2022-04-19: 1000 mL via INTRAVENOUS

## 2022-04-19 MED ORDER — DOCUSATE SODIUM 283 MG RE ENEM
1.0000 | ENEMA | RECTAL | Status: DC | PRN
  Filled 2022-04-19: qty 1

## 2022-04-19 MED ORDER — HEPARIN SODIUM (PORCINE) 5000 UNIT/ML IJ SOLN
5000.0000 [IU] | Freq: Three times a day (TID) | INTRAMUSCULAR | Status: DC
  Filled 2022-04-19 (×4): qty 1

## 2022-04-19 MED ORDER — ACETAMINOPHEN 325 MG PO TABS
650.0000 mg | ORAL_TABLET | Freq: Four times a day (QID) | ORAL | Status: DC | PRN
  Administered 2022-04-24 – 2022-04-26 (×3): 650 mg via ORAL
  Filled 2022-04-19 (×3): qty 2

## 2022-04-19 MED ORDER — NEPRO/CARBSTEADY PO LIQD: 237.0000 mL | Freq: Three times a day (TID) | ORAL | Status: DC | PRN

## 2022-04-19 MED ORDER — INSULIN ASPART 100 UNIT/ML IJ SOLN: 0.0000 [IU] | Freq: Every day | INTRAMUSCULAR | Status: DC

## 2022-04-19 MED ORDER — ONDANSETRON HCL 4 MG/2ML IJ SOLN: 4.0000 mg | Freq: Four times a day (QID) | INTRAMUSCULAR | Status: DC | PRN

## 2022-04-19 MED ORDER — CAMPHOR-MENTHOL 0.5-0.5 % EX LOTN: 1.0000 "application " | TOPICAL_LOTION | Freq: Three times a day (TID) | CUTANEOUS | Status: DC | PRN

## 2022-04-19 MED ORDER — INSULIN ASPART 100 UNIT/ML IJ SOLN
0.0000 [IU] | Freq: Three times a day (TID) | INTRAMUSCULAR | Status: DC
  Administered 2022-04-20: 5 [IU] via SUBCUTANEOUS
  Administered 2022-04-20: 15 [IU] via SUBCUTANEOUS
  Filled 2022-04-19 (×2): qty 1

## 2022-04-19 MED ORDER — PROCHLORPERAZINE EDISYLATE 10 MG/2ML IJ SOLN
10.0000 mg | Freq: Once | INTRAMUSCULAR | Status: AC
  Administered 2022-04-19: 10 mg via INTRAVENOUS
  Filled 2022-04-19: qty 2

## 2022-04-19 MED ORDER — LABETALOL HCL 5 MG/ML IV SOLN: 10.0000 mg | INTRAVENOUS | Status: DC | PRN

## 2022-04-19 MED ORDER — LORAZEPAM 2 MG/ML IJ SOLN
0.5000 mg | Freq: Once | INTRAMUSCULAR | Status: AC
  Administered 2022-04-19: 0.5 mg via INTRAVENOUS
  Filled 2022-04-19: qty 1

## 2022-04-19 MED ORDER — CALCIUM CARBONATE ANTACID 1250 MG/5ML PO SUSP: 500.0000 mg | Freq: Four times a day (QID) | ORAL | Status: DC | PRN

## 2022-04-19 MED ORDER — SORBITOL 70 % SOLN: 30.0000 mL | Status: DC | PRN

## 2022-04-19 MED ORDER — ONDANSETRON HCL 4 MG/2ML IJ SOLN
4.0000 mg | Freq: Four times a day (QID) | INTRAMUSCULAR | Status: DC | PRN
  Administered 2022-04-19 – 2022-04-25 (×17): 4 mg via INTRAVENOUS
  Filled 2022-04-19 (×17): qty 2

## 2022-04-19 MED ORDER — LABETALOL HCL 5 MG/ML IV SOLN
10.0000 mg | Freq: Once | INTRAVENOUS | Status: AC
  Administered 2022-04-19: 10 mg via INTRAVENOUS
  Filled 2022-04-19: qty 4

## 2022-04-19 MED ORDER — HYDROXYZINE HCL 25 MG PO TABS: 25.0000 mg | ORAL_TABLET | Freq: Three times a day (TID) | ORAL | Status: DC | PRN

## 2022-04-19 MED ORDER — LORAZEPAM 2 MG/ML IJ SOLN
1.0000 mg | Freq: Once | INTRAMUSCULAR | Status: AC
  Administered 2022-04-19: 1 mg via INTRAVENOUS
  Filled 2022-04-19: qty 1

## 2022-04-19 NOTE — ED Notes (Signed)
Urine collected and send to lab

## 2022-04-19 NOTE — ED Notes (Signed)
Ip RN is requesting orders prior to admission into IP unit. MD messaged in regards to delay in transfer

## 2022-04-19 NOTE — H&P (Signed)
History and Physical    Patient: Emma Thomas:[id number] DOB: 02/11/1996 DOA: 04/19/2022 DOS: the patient was seen and examined on 04/19/2022 PCP: [facility]  Patient coming from: Home  Chief Complaint:  Chief Complaint  Patient presents with   Nausea   Emesis   HPI: Emma Thomas is a 26 y.o. 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  Patient already has access.  Patient presented to the ER today with intractable nausea with vomiting.  She has been unable to keep anything down.  Symptoms started yesterday and have persisted.  Denied any diarrhea.  She has some abdominal cramping but no pain.  She was seen in the ER with initial treatment started but patient continues to vomit.  At this point work-up including CT abdomen and pelvis did not show any significant pathology.  Suspected viral gastroenteritis.  No history of gastroparesis.  We will admit the patient for symptom control and management.  Review of Systems: As mentioned in the history of present illness. All other systems reviewed and are negative. Past Medical History:  Diagnosis Date   Diabetes mellitus without complication ([place])    Type 1 DM   Essential hypertension    Hypertension 03/04/2013   Neurologic disorder    Both feet   Recurrent UTI    Renal disorder    Past Surgical History:  Procedure Laterality Date   abscess removal     excision of bartholin cyst   Nexplanon  01/2011   Social History:  reports that she has never smoked. She has never used smokeless tobacco. She reports that she does not currently use alcohol after a past usage of about 2.0 standard drinks per week. She reports that she does not use drugs.  No Known Allergies  Family History  Problem Relation Age of Onset   Breast cancer Mother 30   Lung cancer Maternal  Grandmother    Diabetes type II Paternal Grandmother     Prior to Admission medications   Medication Sig Start Date End Date Taking? Authorizing Provider  atorvastatin (LIPITOR) 40 MG tablet Take 40 mg by mouth daily. 01/30/22  Yes [provider]  bumetanide (BUMEX) 1 MG tablet Take 2 mg by mouth daily. 12/04/21  Yes [provider]  ferrous gluconate (FERGON) 324 MG tablet Take by mouth. 05/03/20  Yes [provider]  insulin glargine (LANTUS) 100 UNIT/ML injection Inject 14 Units into the skin 2 (two) times daily. 14u in morning and 12u at night   Yes [provider]  insulin lispro (HUMALOG) 100 UNIT/ML injection Inject into the skin 3 (three) times daily before meals.   Yes [provider]  JUNEL 1/20 1-20 MG-MCG tablet Take 1 tablet by mouth daily. 04/17/22  Yes [provider]  montelukast (SINGULAIR) 10 MG tablet Take 10 mg by mouth daily. 04/19/22  Yes [provider]  Multiple Vitamin (MULTIVITAMIN) capsule Take 1 capsule by mouth daily.   Yes [provider]  ondansetron (ZOFRAN-ODT) 4 MG disintegrating tablet Take 4 mg by mouth every 8 (eight) hours as needed. 12/28/21  Yes [provider]  oxyCODONE (OXY IR/ROXICODONE) 5 MG immediate release tablet Take 5 mg by mouth every 4 (four) hours as needed. 03/19/22  Yes [provider]  sodium bicarbonate 650 MG tablet Take 1,300 mg by mouth 2 (two) times daily. 12/28/21  Yes [provider]  SUMAtriptan (IMITREX) 50 MG tablet Take 50 mg by mouth daily as needed. 11/11/21  Yes [provider]  Vitamin D, Ergocalciferol, (DRISDOL) 1.25 MG (50000 UNIT) CAPS capsule Take 50,000 Units by mouth once a week. 02/05/22  Yes [provider]    Physical Exam: Vitals:   04/19/22 1930 04/19/22 2000 04/19/22 2030 04/19/22 2100  BP: (!) 179/106 (!) 188/108 (!) 161/95 (!) 158/95  Pulse: 100 98 96 97  Resp: 17 (!) 27 (!) 38 (!) 34  Temp:       SpO2: 99% 98% 99% 98%  Weight:       General: Stable, acutely ill looking, no distress HEENT: PERRL, EOMI, dry mucous membranes Neck: Supple, no JVD, no lymphadenopathy Respiratory: Good air entry bilaterally no wheezes rales or crackles Cardiovascular system: Sinus tachycardia Abdomen: Soft, nontender, positive bowel sounds Extremities: No edema cyanosis or clubbing Skin exam: Dry, coarse skin Neuro exam: Nonfocal  Data Reviewed:  Vitals are stable except blood pressure 202/123, respirate of 38, magnesium is 2.4.  Sodium 141, potassium 3.9, chloride 112, CO2 17 glucose 116 BUN 59 creatinine 6.36 calcium 8.4 with anion gap of 12.  CBC appears to be within normal.  CT abdomen pelvis shows mild diffuse wall thickening in the small bowel loops and colon probably nonspecific enterocolitis possibly gallbladder sludge.  Assessment and Plan:  #1 intractable nausea vomiting: Suspected viral gastroenteritis.  Other differentials include gastroparesis, could also be due to gallbladder disease but less likely.  LFTs are notably not elevated.  Admit the patient for symptoms control.  Cannot give IV fluids due to end-stage renal disease.  Supportive care mildly.  IV Zofran.  If not working we may have other options including abdomen, Phenergan or Reglan  #2 end-stage renal disease: No immediate need for hemodialysis.  Patient will be admitted and monitored.  Nephrology to continue follow-up probably outpatient.  #3 type 1 diabetes: Patient will be n.p.o. for now until symptoms resolve.  Sliding scale insulin.  Resume her home regimen at that point.  May get half of her long-acting insulin in the interim.  #4 malignant hypertension: Patient unable to take her home regimen due to nausea vomiting.  As needed labetalol IV will be given.     Advance Care Planning:   Code Status: Prior full code  Consults: None  Family Communication: No family at bedside  Severity of Illness: The appropriate  patient status for this patient is INPATIENT. Inpatient status is judged to be reasonable and necessary in order to provide the required intensity of service to ensure the patient's safety. The patient's presenting symptoms, physical exam findings, and initial radiographic and laboratory data in the context of their chronic comorbidities is felt to place them at high risk for further clinical deterioration. Furthermore, it is not anticipated that the patient will be medically stable for discharge from the hospital within 2 midnights of admission.   * I certify that at the point of admission it is my clinical judgment that the patient will require inpatient hospital care spanning beyond 2 midnights from the point of admission due to high intensity of service, high risk for further deterioration and high frequency of surveillance required.*  AuthorBarbette Merino, MD 04/19/2022 9:15 PM  For on call review [email or web address].

## 2022-04-19 NOTE — ED Provider Notes (Signed)
[facility] Provider Note    Event Date/Time   First MD Initiated Contact with Patient 04/19/22 1710     (approximate)   History   Nausea and Emesis   HPI  Emma Thomas is a 26 y.o. female past medical history of CKD type 1 diabetes hypertension who presents with nausea and vomiting.  Symptoms started yesterday.  She has had multiple episodes of nonbloody nonbilious emesis.  Has not been able to tolerate p.o.  Does have mild abdominal pain but says the nausea is worse.  Does have history of frequent vomiting tried Zofran at home which did not help denies chest pain does have some dyspnea.    Past Medical History:  Diagnosis Date   Diabetes mellitus without complication ([place])    Type 1 DM   Essential hypertension    Hypertension 03/04/2013   Neurologic disorder    Both feet   Recurrent UTI    Renal disorder     Patient Active Problem List   Diagnosis Date Noted   Labor and delivery indication for care or intervention 02/13/2020   Pyelonephritis affecting pregnancy in first trimester 11/01/2019   Supervision of high risk pregnancy, antepartum 11/01/2019   Depression 10/27/2019   Diabetes mellitus ([place]) 10/27/2019   History of chlamydia infection 10/27/2019   History of depression 10/27/2019   Vitamin D deficiency 10/27/2019   Liver function abnormality 08/15/2018   Microalbuminuric diabetic nephropathy ([place]) 08/15/2018   High anion gap metabolic acidosis    AKI (acute kidney injury) ([place])    Abdominal pain    Pyelonephritis 07/30/2016   Sepsis ([place]) 06/08/2016   DKA (diabetic ketoacidoses) 09/09/2015   History of gonorrhea 08/13/2015   Intrauterine device 05/03/2015   Neuropathy 04/13/2013   Weight loss, non-intentional 04/13/2013   Depression, major, recurrent, moderate ([place]) 03/06/2013   Hypertension 03/04/2013     Physical Exam  Triage Vital Signs: ED Triage Vitals  Enc Vitals Group     BP 04/19/22 1715 (!) 201/118     Pulse  Rate 04/19/22 1715 94     Resp 04/19/22 1715 16     Temp 04/19/22 1715 98.8 F (37.1 C)     Temp src --      SpO2 04/19/22 1715 100 %     Weight 04/19/22 1709 140 lb (63.5 kg)     Height --      Head Circumference --      Peak Flow --      Pain Score 04/19/22 1708 10     Pain Loc --      Pain Edu? --      Excl. in [place]? --     Most recent vital signs: Vitals:   04/19/22 1915 04/19/22 1930  BP: (!) 176/103 (!) 179/106  Pulse: 100 100  Resp: (!) 24 17  Temp:    SpO2: 100% 99%     General: Awake, appears uncomfortable, actively vomiting CV:  Good peripheral perfusion.  Resp:  Normal effort.  Abd:  No distention.  Neuro:             Awake, Alert, Oriented x 3  Other:     ED Results / Procedures / Treatments  Labs (all labs ordered are listed, but only abnormal results are displayed) Labs Reviewed  COMPREHENSIVE METABOLIC PANEL - Abnormal; Notable for the following components:      Result Value   Chloride 112 (*)    CO2 17 (*)  Glucose, Bld 116 (*)    BUN 59 (*)    Creatinine, Ser 6.36 (*)    Calcium 8.4 (*)    Albumin 2.8 (*)    GFR, Estimated 9 (*)    All other components within normal limits  CBC WITH DIFFERENTIAL/PLATELET - Abnormal; Notable for the following components:   RBC 2.71 (*)    Hemoglobin 7.7 (*)    HCT 24.0 (*)    Neutro Abs 9.2 (*)    All other components within normal limits  URINALYSIS, ROUTINE W REFLEX MICROSCOPIC - Abnormal; Notable for the following components:   Color, Urine YELLOW (*)    APPearance HAZY (*)    Glucose, UA 150 (*)    Hgb urine dipstick SMALL (*)    Ketones, ur 20 (*)    Protein, ur >=300 (*)    Bacteria, UA RARE (*)    All other components within normal limits  CBG MONITORING, ED - Abnormal; Notable for the following components:   Glucose-Capillary 115 (*)    All other components within normal limits  LIPASE, BLOOD  MAGNESIUM  HCG, QUANTITATIVE, PREGNANCY  POC URINE PREG, ED     EKG  EKG reviewed and  interpreted by myself shows normal sinus rhythm with a prolonged Q interval otherwise normal axis normal intervals without ischemic change   RADIOLOGY I reviewed and interpreted the CT abdomen pelvis which is negative for free air   PROCEDURES:  Critical Care performed: No  Procedures  The patient is on the cardiac monitor to evaluate for evidence of arrhythmia and/or significant heart rate changes.   MEDICATIONS ORDERED IN ED: Medications  ondansetron (ZOFRAN) injection 4 mg (4 mg Intravenous Given 04/19/22 1720)  lactated ringers bolus 1,000 mL (0 mLs Intravenous Stopped 04/19/22 1917)  LORazepam (ATIVAN) injection 1 mg (1 mg Intravenous Given 04/19/22 1736)  labetalol (NORMODYNE) injection 10 mg (10 mg Intravenous Given 04/19/22 1842)  LORazepam (ATIVAN) injection 0.5 mg (0.5 mg Intravenous Given 04/19/22 1919)     IMPRESSION / MDM / ASSESSMENT AND PLAN / ED COURSE  I reviewed the triage vital signs and the nursing notes.                              Patient's presentation is most consistent with acute presentation with potential threat to life or bodily function.  Differential diagnosis includes, but is not limited to, gastroparesis, DKA, pancreatitis, biliary colic, cyclic vomiting syndrome  Patient is a 25 year old female with past medical history of type 1 diabetes end-stage renal disease will be started on dialysis soon presents with nausea and vomiting.  On arrival she is actively vomiting hypertensive appears uncomfortable.  She does not want to lay flat for me to examine her abdomen but with sitting up it is minimally tender and soft.  Does have some left CVA tenderness.  Given IV Zofran.  When EKG is obtained she does have a prolonged QT at 520 so we will avoid any further QT prolonging agents.  Treat ongoing vomiting with benzos.  Patient is complaining of significant abdominal pain despite her relatively benign exam we will obtain a CT to rule out acute abdomen as a cause of  her vomiting.  Patient's labs are reassuring.  Creatinine is about 6 and was around 5 prior.  She does have bicarb of 17 but this is similar to baseline suspect this is in the setting of her renal failure.  Hemoglobin is  7 which is also around her baseline she has no leukocytosis.  Potassium is normal.  Given the prolonged QT will send a mag.  After IV Ativan and Zofran patient is resting comfortably.  Continues to endorse some nausea but is not actively vomiting.  I have asked her to provide a urine sample for an hCG patient does not feel like she can pee at this time.  We will add on a quantitative hCG level.  Patient CT shows nonspecific small bowel thickening possibly consistent with enterocolitis.  There is some dependent sludge/possibly tiny stones in the gallbladder but no signs of cholecystitis.  I performed a bedside ultrasound which shows a normal gallbladder there is negative Murphy sign no pericholecystic fluid or obvious stones to suggest cholecystitis.  On reassessment patient is still significantly nauseous.  She was able to tolerate p.o.  Ultimately I think she would be best served with admission for observation to ensure she is tolerating p.o. and that nausea is controlled.  We are somewhat limited with what agents we can use because of her QT interval.  Will discuss with hospice for admission Clinical Course as of 04/19/22 1955  Sun Apr 19, 2022  1945 Lymphocyte #: 1.0 [KM]  1945 Monocytes Relative: 1 [KM]    Clinical Course User Index [KM] Rada Hay, MD     FINAL CLINICAL IMPRESSION(S) / ED DIAGNOSES   Final diagnoses:  Nausea and vomiting, unspecified vomiting type     Rx / DC Orders   ED Discharge Orders     None        Note:  This document was prepared using Dragon voice recognition software and may include unintentional dictation errors.   Rada Hay, MD 04/19/22 Karl Bales

## 2022-04-19 NOTE — ED Triage Notes (Addendum)
Pt arrives with EMS for N/V/D since midnight. Pt also stating Lower back pain. Pt recently got a RUE Fistula placed for future hemodialysis, Kidney disease. Vvs with bp 198/110. PT arrives alert and sitting up with emesis bag. PT stating they drank 5 shots of alcohol last night.  Pt denies any recent marijuana usage.

## 2022-04-19 NOTE — ED Notes (Signed)
Pt vomitting into emesis bag

## 2022-04-19 NOTE — ED Notes (Signed)
Assisted to [facility].

## 2022-04-20 DIAGNOSIS — K529 Noninfective gastroenteritis and colitis, unspecified: Secondary | ICD-10-CM | POA: Diagnosis not present

## 2022-04-20 LAB — COMPREHENSIVE METABOLIC PANEL
ALT: 14 U/L (ref 0–44)
AST: 13 U/L — ABNORMAL LOW (ref 15–41)
Albumin: 2.5 g/dL — ABNORMAL LOW (ref 3.5–5.0)
Alkaline Phosphatase: 44 U/L (ref 38–126)
Anion gap: 22 — ABNORMAL HIGH (ref 5–15)
BUN: 69 mg/dL — ABNORMAL HIGH (ref 6–20)
CO2: 8 mmol/L — ABNORMAL LOW (ref 22–32)
Calcium: 7.7 mg/dL — ABNORMAL LOW (ref 8.9–10.3)
Chloride: 109 mmol/L (ref 98–111)
Creatinine, Ser: 6.91 mg/dL — ABNORMAL HIGH (ref 0.44–1.00)
GFR, Estimated: 8 mL/min — ABNORMAL LOW (ref >60)
Glucose, Bld: 387 mg/dL — ABNORMAL HIGH (ref 70–99)
Potassium: 4.3 mmol/L (ref 3.5–5.1)
Sodium: 139 mmol/L (ref 135–145)
Total Bilirubin: 1.1 mg/dL (ref 0.3–1.2)
Total Protein: 6.3 g/dL — ABNORMAL LOW (ref 6.5–8.1)

## 2022-04-20 LAB — GLUCOSE, CAPILLARY
Glucose-Capillary: 359 mg/dL — ABNORMAL HIGH (ref 70–99)
Glucose-Capillary: 217 mg/dL — ABNORMAL HIGH (ref 70–99)
Glucose-Capillary: 219 mg/dL — ABNORMAL HIGH (ref 70–99)
Glucose-Capillary: 168 mg/dL — ABNORMAL HIGH (ref 70–99)

## 2022-04-20 LAB — CBC
HCT: 22.8 % — ABNORMAL LOW (ref 36.0–46.0)
Hemoglobin: 7.3 g/dL — ABNORMAL LOW (ref 12.0–15.0)
MCH: 28.9 pg (ref 26.0–34.0)
MCHC: 32 g/dL (ref 30.0–36.0)
MCV: 90.1 fL (ref 80.0–100.0)
Platelets: 299 10*3/uL (ref 150–400)
RBC: 2.53 MIL/uL — ABNORMAL LOW (ref 3.87–5.11)
RDW: 12.6 % (ref 11.5–15.5)
WBC: 12.3 10*3/uL — ABNORMAL HIGH (ref 4.0–10.5)
nRBC: 0 % (ref 0.0–0.2)

## 2022-04-20 LAB — HIV ANTIBODY (ROUTINE TESTING W REFLEX): HIV Screen 4th Generation wRfx: NONREACTIVE

## 2022-04-20 MED ORDER — AMLODIPINE BESYLATE 10 MG PO TABS
ORAL_TABLET | ORAL | Status: AC
  Filled 2022-04-20: qty 1

## 2022-04-20 MED ORDER — ADULT MULTIVITAMIN W/MINERALS CH
1.0000 | ORAL_TABLET | Freq: Every day | ORAL | Status: DC
  Administered 2022-04-20: 1 via ORAL
  Filled 2022-04-20 (×2): qty 1

## 2022-04-20 MED ORDER — PANTOPRAZOLE SODIUM 40 MG PO TBEC
40.0000 mg | DELAYED_RELEASE_TABLET | Freq: Every day | ORAL | Status: DC
  Administered 2022-04-20 – 2022-04-22 (×2): 40 mg via ORAL
  Filled 2022-04-20 (×3): qty 1

## 2022-04-20 MED ORDER — LABETALOL HCL 5 MG/ML IV SOLN
10.0000 mg | INTRAVENOUS | Status: DC | PRN
Start: 2022-04-20 — End: 2022-04-27
  Administered 2022-04-20 – 2022-04-24 (×3): 10 mg via INTRAVENOUS
  Filled 2022-04-20 (×3): qty 4

## 2022-04-20 MED ORDER — VITAMIN D (ERGOCALCIFEROL) 1.25 MG (50000 UNIT) PO CAPS
50000.0000 [IU] | ORAL_CAPSULE | ORAL | Status: DC
  Administered 2022-04-20 – 2022-04-27 (×2): 50000 [IU] via ORAL
  Filled 2022-04-20 (×2): qty 1

## 2022-04-20 MED ORDER — INSULIN ASPART 100 UNIT/ML IJ SOLN
0.0000 [IU] | Freq: Three times a day (TID) | INTRAMUSCULAR | Status: DC
  Administered 2022-04-20: 3 [IU] via SUBCUTANEOUS
  Administered 2022-04-21: 5 [IU] via SUBCUTANEOUS
  Administered 2022-04-21: 2 [IU] via SUBCUTANEOUS
  Administered 2022-04-21: 3 [IU] via SUBCUTANEOUS
  Administered 2022-04-22: 2 [IU] via SUBCUTANEOUS
  Administered 2022-04-23: 1 [IU] via SUBCUTANEOUS
  Administered 2022-04-24: 2 [IU] via SUBCUTANEOUS
  Administered 2022-04-24: 1 [IU] via SUBCUTANEOUS
  Administered 2022-04-26: 5 [IU] via SUBCUTANEOUS
  Administered 2022-04-26: 1 [IU] via SUBCUTANEOUS
  Administered 2022-04-26: 2 [IU] via SUBCUTANEOUS
  Filled 2022-04-20 (×10): qty 1

## 2022-04-20 MED ORDER — SODIUM CHLORIDE 0.9 % IV SOLN: INTRAVENOUS | Status: DC

## 2022-04-20 MED ORDER — INSULIN ASPART 100 UNIT/ML IJ SOLN
0.0000 [IU] | Freq: Every day | INTRAMUSCULAR | Status: DC
  Administered 2022-04-26: 2 [IU] via SUBCUTANEOUS
  Filled 2022-04-20: qty 1

## 2022-04-20 MED ORDER — PROCHLORPERAZINE EDISYLATE 10 MG/2ML IJ SOLN
10.0000 mg | Freq: Three times a day (TID) | INTRAMUSCULAR | Status: DC | PRN
Start: 2022-04-20 — End: 2022-04-22
  Administered 2022-04-20 – 2022-04-22 (×7): 10 mg via INTRAVENOUS
  Filled 2022-04-20 (×7): qty 2

## 2022-04-20 MED ORDER — SODIUM BICARBONATE 650 MG PO TABS
ORAL_TABLET | ORAL | Status: AC
  Filled 2022-04-20: qty 2

## 2022-04-20 MED ORDER — INSULIN GLARGINE-YFGN 100 UNIT/ML ~~LOC~~ SOLN
14.0000 [IU] | Freq: Every day | SUBCUTANEOUS | Status: DC
  Administered 2022-04-20: 14 [IU] via SUBCUTANEOUS
  Filled 2022-04-20 (×2): qty 0.14

## 2022-04-20 MED ORDER — PROCHLORPERAZINE EDISYLATE 10 MG/2ML IJ SOLN
10.0000 mg | Freq: Once | INTRAMUSCULAR | Status: AC
  Administered 2022-04-20: 10 mg via INTRAVENOUS
  Filled 2022-04-20: qty 2

## 2022-04-20 MED ORDER — SODIUM BICARBONATE 650 MG PO TABS
1300.0000 mg | ORAL_TABLET | Freq: Two times a day (BID) | ORAL | Status: DC
Start: 2022-04-20 — End: 2022-04-25
  Administered 2022-04-20 – 2022-04-23 (×2): 1300 mg via ORAL
  Filled 2022-04-20 (×6): qty 2

## 2022-04-20 MED ORDER — FERROUS GLUCONATE 324 (38 FE) MG PO TABS
324.0000 mg | ORAL_TABLET | Freq: Every day | ORAL | Status: DC
  Administered 2022-04-24 – 2022-04-27 (×4): 324 mg via ORAL
  Filled 2022-04-20 (×8): qty 1

## 2022-04-20 MED ORDER — MONTELUKAST SODIUM 10 MG PO TABS
10.0000 mg | ORAL_TABLET | Freq: Every day | ORAL | Status: DC
  Administered 2022-04-25 – 2022-04-26 (×2): 10 mg via ORAL
  Filled 2022-04-20 (×4): qty 1

## 2022-04-20 MED ORDER — ADULT MULTIVITAMIN W/MINERALS CH
ORAL_TABLET | ORAL | Status: AC
  Filled 2022-04-20: qty 1

## 2022-04-20 MED ORDER — AMLODIPINE BESYLATE 10 MG PO TABS
10.0000 mg | ORAL_TABLET | Freq: Every day | ORAL | Status: DC
  Administered 2022-04-20 – 2022-04-27 (×6): 10 mg via ORAL
  Filled 2022-04-20 (×7): qty 1

## 2022-04-20 NOTE — Progress Notes (Signed)
Patient continues to refuse to eat. Remains on clear liquids at this time until able to tolerate. This nurse encouraged patient to take in liquids, ginger ale and water given at request. IVF infusing. Dr. Posey Pronto aware.

## 2022-04-20 NOTE — Consult Note (Signed)
[facility]  CONSULT NOTE    Date: 04/20/2022                  Patient Name:  Emma Thomas  MRN: [id number]  DOB: 06/02/1996  Age / Sex: 26 y.o., female         PCP: [facility]                 Service Requesting Consult: [place]                 Reason for Consult: Acute kidney injury            History of Present Illness: Emma Thomas is a 26 y.o.  female with type 1 diabetes, chronic kidney disease V, and hypertenison, who was admitted to [facility] on 04/19/2022 for Gastroenteritis [K52.9] Nausea and vomiting, unspecified vomiting type [R11.2]  Patient currently received nephrology follow up with [facility]. She presents to the ED with complaints of nausea and vomiting. She states she was at her father's wedding on Saturday and consumed too much alcohol. She has been vomiting since Saturday night. Unable to tolerate meals since that time. She recently had dialysis fistula placed earlier this month and states dialysis may be initiated once matured. She is currently inactive on the pancreas/kidney transplant list.   Labs on arrival include bicarb 17, glucose 116, BUN 59, and creatinine 6.39 with GFR 9.  Baseline creatinine appears to be 5.0 with GFR 11.  UA appears hazy with ketones, glucose, and proteinuria.  CT abdomen pelvis negative for acute changes renal obstruction, or acute cholecystitis.   Medications: Outpatient medications: Medications Prior to Admission  Medication Sig Dispense Refill Last Dose   atorvastatin (LIPITOR) 40 MG tablet Take 40 mg by mouth daily.   Past Week   bumetanide (BUMEX) 1 MG tablet Take 2 mg by mouth daily.   Past Month   ferrous gluconate (FERGON) 324 MG tablet Take by mouth.   Past Week   insulin glargine (LANTUS) 100 UNIT/ML injection Inject 14 Units into the skin 2 (two) times daily. 14u in morning and 12u at night   Past Week   insulin lispro (HUMALOG) 100 UNIT/ML injection Inject into the skin 3 (three) times  daily before meals.   Past Week   JUNEL 1/20 1-20 MG-MCG tablet Take 1 tablet by mouth daily.   Past Week   montelukast (SINGULAIR) 10 MG tablet Take 10 mg by mouth daily.   Past Week   Multiple Vitamin (MULTIVITAMIN) capsule Take 1 capsule by mouth daily.   Past Week   ondansetron (ZOFRAN-ODT) 4 MG disintegrating tablet Take 4 mg by mouth every 8 (eight) hours as needed.   04/19/2022   oxyCODONE (OXY IR/ROXICODONE) 5 MG immediate release tablet Take 5 mg by mouth every 4 (four) hours as needed.   prn at prn   sodium bicarbonate 650 MG tablet Take 1,300 mg by mouth 2 (two) times daily.   Past Week   SUMAtriptan (IMITREX) 50 MG tablet Take 50 mg by mouth daily as needed.   prn at prn   Vitamin D, Ergocalciferol, (DRISDOL) 1.25 MG (50000 UNIT) CAPS capsule Take 50,000 Units by mouth once a week.   Past Week    Current medications: Current Facility-Administered Medications  Medication Dose Route Frequency Provider Last Rate Last Admin   0.9 %  sodium chloride infusion   Intravenous Continuous Colon Flattery, NP 50 mL/hr at 04/20/22 1505 Infusion Verify at  04/20/22 1505   acetaminophen (TYLENOL) tablet 650 mg  650 mg Oral Q6H PRN Elwyn Reach, MD       Or   acetaminophen (TYLENOL) suppository 650 mg  650 mg Rectal Q6H PRN Elwyn Reach, MD       amLODipine (NORVASC) tablet 10 mg  10 mg Oral Daily Fritzi Mandes, MD   10 mg at 04/20/22 1053   calcium carbonate (dosed in mg elemental calcium) suspension 500 mg of elemental calcium  500 mg of elemental calcium Oral Q6H PRN Elwyn Reach, MD       camphor-menthol (SARNA) lotion 1 application.  1 application. Topical Q8H PRN Elwyn Reach, MD       And   hydrOXYzine (ATARAX) tablet 25 mg  25 mg Oral Q8H PRN Elwyn Reach, MD       docusate sodium (ENEMEEZ) enema 283 mg  1 enema Rectal PRN Elwyn Reach, MD       feeding supplement (NEPRO CARB STEADY) liquid 237 mL  237 mL Oral TID PRN Elwyn Reach, MD       ferrous  gluconate (FERGON) tablet 324 mg  324 mg Oral Q breakfast Fritzi Mandes, MD       heparin injection 5,000 Units  5,000 Units Subcutaneous Q8H Garba, Mohammad L, MD       insulin aspart (novoLOG) injection 0-5 Units  0-5 Units Subcutaneous QHS Fritzi Mandes, MD       insulin aspart (novoLOG) injection 0-9 Units  0-9 Units Subcutaneous TID WC Fritzi Mandes, MD       insulin glargine-yfgn (SEMGLEE) injection 14 Units  14 Units Subcutaneous Daily Fritzi Mandes, MD   14 Units at 04/20/22 1512   labetalol (NORMODYNE) injection 10 mg  10 mg Intravenous Q2H PRN Fritzi Mandes, MD   10 mg at 04/20/22 0807   montelukast (SINGULAIR) tablet 10 mg  10 mg Oral QHS Fritzi Mandes, MD       multivitamin with minerals tablet 1 tablet  1 tablet Oral Daily Fritzi Mandes, MD   1 tablet at 04/20/22 1054   ondansetron (ZOFRAN) tablet 4 mg  4 mg Oral Q6H PRN Elwyn Reach, MD       Or   ondansetron (ZOFRAN) injection 4 mg  4 mg Intravenous Q6H PRN Gala Romney L, MD   4 mg at 04/20/22 1513   sodium bicarbonate tablet 1,300 mg  1,300 mg Oral BID Fritzi Mandes, MD   1,300 mg at 04/20/22 1053   sorbitol 70 % solution 30 mL  30 mL Oral PRN Elwyn Reach, MD       Vitamin D (Ergocalciferol) (DRISDOL) capsule 50,000 Units  50,000 Units Oral Weekly Fritzi Mandes, MD   50,000 Units at 04/20/22 1054   zolpidem (AMBIEN) tablet 5 mg  5 mg Oral QHS PRN Elwyn Reach, MD   5 mg at 04/19/22 2246      Allergies: No Known Allergies    Past Medical History: Past Medical History:  Diagnosis Date   Diabetes mellitus without complication ([place])    Type 1 DM   Essential hypertension    Hypertension 03/04/2013   Neurologic disorder    Both feet   Recurrent UTI    Renal disorder      Past Surgical History: Past Surgical History:  Procedure Laterality Date   abscess removal     excision of bartholin cyst   Nexplanon  01/2011     Family History: Family  History  Problem Relation Age of Onset   Breast cancer Mother 7    Lung cancer Maternal Grandmother    Diabetes type II Paternal Grandmother      Social History: Social History   Socioeconomic History   Marital status: Married    Spouse name: Not on file   Number of children: Not on file   Years of education: Not on file   Highest education level: Not on file  Occupational History   Not on file  Tobacco Use   Smoking status: Never   Smokeless tobacco: Never  Vaping Use   Vaping Use: Never used  Substance and Sexual Activity   Alcohol use: Not Currently    Alcohol/week: 2.0 standard drinks    Types: 2 Shots of liquor per week   Drug use: No   Sexual activity: Yes    Birth control/protection: None  Other Topics Concern   Not on file  Social History Narrative   Not on file   Social Determinants of Health   Financial Resource Strain: Not on file  Food Insecurity: Not on file  Transportation Needs: Not on file  Physical Activity: Not on file  Stress: Not on file  Social Connections: Not on file  Intimate Partner Violence: Not on file     Review of Systems: Review of Systems  Constitutional:  Negative for chills, fever and malaise/fatigue.  HENT:  Negative for congestion, sore throat and tinnitus.   Eyes:  Negative for blurred vision and redness.  Respiratory:  Negative for cough, shortness of breath and wheezing.   Cardiovascular:  Negative for chest pain, palpitations, claudication and leg swelling.  Gastrointestinal:  Positive for nausea and vomiting. Negative for abdominal pain, blood in stool and diarrhea.  Genitourinary:  Negative for flank pain, frequency and hematuria.  Musculoskeletal:  Negative for back pain, falls and myalgias.  Skin:  Negative for rash.  Neurological:  Negative for dizziness, weakness and headaches.  Endo/Heme/Allergies:  Does not bruise/bleed easily.  Psychiatric/Behavioral:  Negative for depression. The patient is not nervous/anxious and does not have insomnia.    Vital Signs: Blood pressure (!)  151/82, pulse (!) 103, temperature 98.7 F (37.1 C), resp. rate 18, height 5\' 6"  (1.676 m), weight 65.4 kg, last menstrual period 04/12/2022, SpO2 100 %, unknown if currently breastfeeding.  Weight trends: Filed Weights   04/19/22 2147 04/19/22 2224 04/20/22 0323  Weight: 64.6 kg 64.6 kg 65.4 kg    Physical Exam: General: NAD, ill-appearing  Head: Normocephalic, atraumatic. Moist oral mucosal membranes  Eyes: Anicteric  Lungs:  Clear to auscultation, normal effort, room air  Heart: Regular rate and rhythm  Abdomen:  Soft, nontender,   Extremities: No peripheral edema.  Neurologic: Nonfocal, moving all four extremities  Skin: No lesions  Access: Right AVF (maturing)     Lab results: Basic Metabolic Panel: Recent Labs  Lab 04/19/22 1705 04/19/22 1813 04/20/22 0733  NA 141  --  139  K 3.9  --  4.3  CL 112*  --  109  CO2 17*  --  8*  GLUCOSE 116*  --  387*  BUN 59*  --  69*  CREATININE 6.36*  --  6.91*  CALCIUM 8.4*  --  7.7*  MG  --  2.4  --     Liver Function Tests: Recent Labs  Lab 04/19/22 1705 04/20/22 0733  AST 21 13*  ALT 17 14  ALKPHOS 52 44  BILITOT 0.7 1.1  PROT 7.4 6.3*  ALBUMIN 2.8* 2.5*   Recent Labs  Lab 04/19/22 1705  LIPASE 21   No results for input(s): AMMONIA in the last 168 hours.  CBC: Recent Labs  Lab 04/19/22 1705 04/20/22 0733  WBC 10.4 12.3*  NEUTROABS 9.2*  --   HGB 7.7* 7.3*  HCT 24.0* 22.8*  MCV 88.6 90.1  PLT 326 299    Cardiac Enzymes: No results for input(s): CKTOTAL, CKMB, CKMBINDEX, TROPONINI in the last 168 hours.  BNP: Invalid input(s): POCBNP  CBG: Recent Labs  Lab 04/19/22 1720 04/19/22 2214 04/20/22 0756 04/20/22 1142  GLUCAP 115* 193* 359* 217*    Microbiology: Results for orders placed or performed during the hospital encounter of 02/13/20  Respiratory Panel by RT PCR (Flu A&B, Covid) - Nasopharyngeal Swab     Status: None   Collection Time: 02/13/20  2:41 PM   Specimen: Nasopharyngeal Swab   Result Value Ref Range Status   SARS Coronavirus 2 by RT PCR NEGATIVE NEGATIVE Final    Comment: (NOTE) SARS-CoV-2 target nucleic acids are NOT DETECTED. The SARS-CoV-2 RNA is generally detectable in upper respiratoy specimens during the acute phase of infection. The lowest concentration of SARS-CoV-2 viral copies this assay can detect is 131 copies/mL. A negative result does not preclude SARS-Cov-2 infection and should not be used as the sole basis for treatment or other patient management decisions. A negative result may occur with  improper specimen collection/handling, submission of specimen other than nasopharyngeal swab, presence of viral mutation(s) within the areas targeted by this assay, and inadequate number of viral copies (<131 copies/mL). A negative result must be combined with clinical observations, patient history, and epidemiological information. The expected result is Negative. Fact Sheet for Patients:  PinkCheek.be Fact Sheet for Healthcare Providers:  GravelBags.it This test is not yet ap proved or cleared by the [place] FDA and  has been authorized for detection and/or diagnosis of SARS-CoV-2 by FDA under an Emergency Use Authorization (EUA). This EUA will remain  in effect (meaning this test can be used) for the duration of the COVID-19 declaration under Section 564(b)(1) of the Act, 21 U.S.C. section 360bbb-3(b)(1), unless the authorization is terminated or revoked sooner.    Influenza A by PCR NEGATIVE NEGATIVE Final   Influenza B by PCR NEGATIVE NEGATIVE Final    Comment: (NOTE) The Xpert Xpress SARS-CoV-2/FLU/RSV assay is intended as an aid in  the diagnosis of influenza from Nasopharyngeal swab specimens and  should not be used as a sole basis for treatment. Nasal washings and  aspirates are unacceptable for Xpert Xpress SARS-CoV-2/FLU/RSV  testing. Fact Sheet for  Patients: PinkCheek.be Fact Sheet for Healthcare Providers: GravelBags.it This test is not yet approved or cleared by the [place] FDA and  has been authorized for detection and/or diagnosis of SARS-CoV-2 by  FDA under an Emergency Use Authorization (EUA). This EUA will remain  in effect (meaning this test can be used) for the duration of the  Covid-19 declaration under Section 564(b)(1) of the Act, 21  U.S.C. section 360bbb-3(b)(1), unless the authorization is  terminated or revoked. Performed at [facility], [address] [place], [place] [address]     Coagulation Studies: No results for input(s): LABPROT, INR in the last 72 hours.  Urinalysis: Recent Labs    04/19/22 1924  COLORURINE YELLOW*  LABSPEC 1.014  PHURINE 7.0  GLUCOSEU 150*  HGBUR SMALL*  BILIRUBINUR NEGATIVE  KETONESUR 20*  PROTEINUR >=300*  NITRITE NEGATIVE  LEUKOCYTESUR NEGATIVE  Imaging: CT ABDOMEN PELVIS WO CONTRAST  Result Date: 04/19/2022 CLINICAL DATA:  Abdominal pain, nausea, vomiting, diarrhea EXAM: CT ABDOMEN AND PELVIS WITHOUT CONTRAST TECHNIQUE: Multidetector CT imaging of the abdomen and pelvis was performed following the standard protocol without IV contrast. RADIATION DOSE REDUCTION: This exam was performed according to the departmental dose-optimization program which includes automated exposure control, adjustment of the mA and/or kV according to patient size and/or use of iterative reconstruction technique. COMPARISON:  None Available. FINDINGS: Lower chest: Unremarkable. Hepatobiliary: No focal abnormality is seen in the liver. There is no dilation of bile ducts. Gallbladder is slightly distended. There is no wall thickening. There is increased density in the dependent portion of gallbladder lumen. Pancreas: There is atrophy.  No focal abnormality is seen. Spleen: Unremarkable. Adrenals/Urinary Tract: Adrenals are  unremarkable. There is no hydronephrosis. 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 There are no renal or ureteral stones. Urinary bladder is unremarkable. Stomach/Bowel: Small hiatal hernia is seen. There is wall thickening in the lower thoracic esophagus possibly suggesting reflux esophagitis. There is mild diffuse wall thickening in the small bowel loops. There is minimal stranding adjacent to the small bowel loops. Appendix is unremarkable. There is mild diffuse wall thickening in the colon. This may be due to incomplete distention or suggest nonspecific inflammation. Vascular/Lymphatic: Unremarkable. Reproductive: Unremarkable. Other: There is minimal ascites. There is no pneumoperitoneum. Small umbilical hernia containing fat is seen. Musculoskeletal: Unremarkable. IMPRESSION: There is no evidence of intestinal obstruction or pneumoperitoneum. There is no hydronephrosis. Appendix is not dilated. There is mild diffuse wall thickening in the small bowel loops and colon. This may be due to incomplete distention or suggest nonspecific enterocolitis. There is minimal stranding in the fat planes adjacent to small bowel loops. There is minimal ascites. Subtle increased density is seen in the dependent portion of gallbladder lumen suggesting presence of sludge and possibly tiny stones. There are no signs of acute cholecystitis. There is no dilation of bile ducts. Electronically Signed   By: Elmer Picker M.D.   On: 04/19/2022 19:20     Assessment & Plan: Ms. TEKEISHA HAKIM is a 26 y.o.  female with type 1 diabetes, chronic kidney disease V, and hypertenison, who was admitted to [facility] on 04/19/2022 for Gastroenteritis [K52.9] Nausea and vomiting, unspecified vomiting type [R11.2]  Acute kidney injury on chronic kidney disease stage V.  Baseline creatinine appears to be 5.0 with GFR 11 on 03/13/2022.  AV fistula placed on 03/19/2022, followed by [facility] physicians.  Acute kidney injury likely  secondary to hypovolemia and poor oral intake.  Patient reports nausea and vomiting after alcohol consumption on Saturday.  CT abdomen pelvis negative for renal obstruction.  No acute indication for dialysis at this time.  Creatinine on admission 6.36 with BUN 59.  Will provide gentle hydration, normal saline at 50 mL/h.  Continue to avoid nephrotoxic agents and therapies.  We will continue to monitor.  2. Anemia of chronic kidney disease  Lab Results  Component Value Date   HGB 7.3 (L) 04/20/2022    Hemoglobin below desired target.  We will continue to monitor and evaluate need for ESA's.  Would avoid blood transfusion due to transplant evaluation.  3. Secondary Hyperparathyroidism:  Lab Results  Component Value Date   CALCIUM 7.7 (L) 04/20/2022   PHOS 5.2 (H) 11/01/2019    Calcium below desired target however phosphorus at goal.  We will continue to monitor.  Continue vitamin D and calcium carbonate.  4.  Diabetes mellitus type 1 with chronic kidney disease.  Insulin-dependent on Humalog and Lantus.  Last hemoglobin A1c 9.4 on 04/19/2022.  Glucose slightly elevated during this admission.  Primary team to manage sliding scale insulin.  LOS: [place] 6/5/20233:30 PM

## 2022-04-20 NOTE — Progress Notes (Signed)
[place] [facility] [place] NAME: Roland Prine    MR#:  [id number]  DATE OF BIRTH:  12/06/1995  SUBJECTIVE:   patient came in with intractable nausea and vomiting. Seen in the room. Not much interactive to have conversation. Tells me she is not hungry and does not feel like eating. No vomiting or diarrhea reported. Blood pressure has been on the higher side. She does not remember blood pressure meds that she is been taking and if she is taking.   VITALS:  Blood pressure (!) 151/82, pulse (!) 103, temperature 98.7 F (37.1 C), resp. rate 18, height 5\' 6"  (1.676 m), weight 65.4 kg, last menstrual period 04/12/2022, SpO2 100 %, unknown if currently breastfeeding.  PHYSICAL EXAMINATION:   GENERAL:  26 y.o.-year-old patient lying in the bed with no acute distress. Thin LUNGS: Normal breath sounds bilaterally, no wheezing, rales, rhonchi.  CARDIOVASCULAR: S1, S2 normal. No murmurs, rubs, or gallops.  ABDOMEN: Soft, nontender, nondistended. Bowel sounds present.  EXTREMITIES: No  edema b/l.    NEUROLOGIC: nonfocal  patient is alert and awake SKIN: No obvious rash, lesion, or ulcer.   LABORATORY PANEL:  CBC Recent Labs  Lab 04/20/22 0733  WBC 12.3*  HGB 7.3*  HCT 22.8*  PLT 299    Chemistries  Recent Labs  Lab 04/19/22 1813 04/20/22 0733  NA  --  139  K  --  4.3  CL  --  109  CO2  --  8*  GLUCOSE  --  387*  BUN  --  69*  CREATININE  --  6.91*  CALCIUM  --  7.7*  MG 2.4  --   AST  --  13*  ALT  --  14  ALKPHOS  --  44  BILITOT  --  1.1   Cardiac Enzymes No results for input(s): TROPONINI in the last 168 hours. RADIOLOGY:  CT ABDOMEN PELVIS WO CONTRAST  Result Date: 04/19/2022 CLINICAL DATA:  Abdominal pain, nausea, vomiting, diarrhea EXAM: CT ABDOMEN AND PELVIS WITHOUT CONTRAST TECHNIQUE: Multidetector CT imaging of the abdomen and pelvis was performed following the standard protocol without IV contrast. RADIATION DOSE  REDUCTION: This exam was performed according to the departmental dose-optimization program which includes automated exposure control, adjustment of the mA and/or kV according to patient size and/or use of iterative reconstruction technique. COMPARISON:  None Available. FINDINGS: Lower chest: Unremarkable. Hepatobiliary: No focal abnormality is seen in the liver. There is no dilation of bile ducts. Gallbladder is slightly distended. There is no wall thickening. There is increased density in the dependent portion of gallbladder lumen. Pancreas: There is atrophy.  No focal abnormality is seen. Spleen: Unremarkable. Adrenals/Urinary Tract: Adrenals are unremarkable. There is no hydronephrosis. 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 There are no renal or ureteral stones. Urinary bladder is unremarkable. Stomach/Bowel: Small hiatal hernia is seen. There is wall thickening in the lower thoracic esophagus possibly suggesting reflux esophagitis. There is mild diffuse wall thickening in the small bowel loops. There is minimal stranding adjacent to the small bowel loops. Appendix is unremarkable. There is mild diffuse wall thickening in the colon. This may be due to incomplete distention or suggest nonspecific inflammation. Vascular/Lymphatic: Unremarkable. Reproductive: Unremarkable. Other: There is minimal ascites. There is no pneumoperitoneum. Small umbilical hernia containing fat is seen. Musculoskeletal: Unremarkable. IMPRESSION: There is no evidence of intestinal obstruction or pneumoperitoneum. There is no hydronephrosis. Appendix is not dilated. There is mild diffuse  wall thickening in the small bowel loops and colon. This may be due to incomplete distention or suggest nonspecific enterocolitis. There is minimal stranding in the fat planes adjacent to small bowel loops. There is minimal ascites. Subtle increased density is seen in the dependent portion of gallbladder lumen suggesting presence of  sludge and possibly tiny stones. There are no signs of acute cholecystitis. There is no dilation of bile ducts. Electronically Signed   By: Elmer Picker M.D.   On: 04/19/2022 19:20    Assessment and Plan  [place] Anagnos Odonnell is a 26 y.o. 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  Patient already has access.  Patient presented to the ER today with intractable nausea with vomiting.  She has been unable to keep anything down.  Symptoms started yesterday and have persisted.  Intractable nausea vomiting with possible ileus -- continue IV fluids. Patient has not had any more episode. Try clear liquid advances tolerated -- CT abdomen shows mild diffuse wall thickening with small bowel loops and colon probably nonspecific enterocolitis/ileus -- no fever  CKD stage V -- follows with nephrology at [facility]. She already has fistula in preparation for dialysis -- nephrology consultation with Dr. Zollie Scale-- recommends continue IV hydration. No emergent indication for dialysis -- monitor input output  type I diabetes on insulin with hyperglycemia, CKD stage V -- will start insulin per diabetes coordinator -- continue sliding scale -- encourage patient to eat  malignant hypertension -- start patient on Norvasc -- as needed labetalol  no family at bedside  Procedures: Family communication : none Consults : nephrology CODE STATUS: full DVT Prophylaxis : heparin Level of care: Telemetry Medical Status is: Inpatient Remains inpatient appropriate because: intractable nausea vomiting. Need to monitor blood pressure, PO intake    TOTAL TIME TAKING CARE OF THIS PATIENT: 35 minutes.  >50% time spent on counselling and coordination of care  Note: This dictation was prepared with Dragon dictation along with smaller phrase technology. Any transcriptional  errors that result from this process are unintentional.  Fritzi Mandes M.D    [facility]   CC: Primary care physician; [facility]

## 2022-04-20 NOTE — Progress Notes (Addendum)
Inpatient Diabetes Program Recommendations  AACE/ADA: New Consensus Statement on Inpatient Glycemic Control   Target Ranges:  Prepandial:   less than 140 mg/dL      Peak postprandial:   less than 180 mg/dL (1-2 hours)      Critically ill patients:  140 - 180 mg/dL    Latest Reference Range & Units 04/20/22 07:56 04/20/22 11:42  Glucose-Capillary 70 - 99 mg/dL 359 (H)  Novolog 15 units @8 :06 217 (H)    Latest Reference Range & Units 04/19/22 17:05 04/20/22 07:33  CO2 22 - 32 mmol/L 17 (L) 8 (L)  Glucose 70 - 99 mg/dL 116 (H) 387 (H)  Anion gap 5 - 15  12 22  (H)    Latest Reference Range & Units 04/19/22 17:05  Glucose 70 - 99 mg/dL 116 (H)  Hemoglobin A1C 4.8 - 5.6 % 9.4 (H)   Review of Glycemic Control  Diabetes history: DM1 (does NOT make insulin; requires basal, correction, and carbohydrate coverage insulin) Outpatient Diabetes medications: Lantus 14 units QHS, Humalog TID for meal coverage and correction (1 unit per 8 grams of carbs, 1 unit for every 50 mg/dl above 150 mg/dl) Current orders for Inpatient glycemic control: Novolog 0-15 units TID with meals, Novolog 0-5 units QHS  Inpatient Diabetes Program Recommendations:    Insulin: Please consider ordering Semglee 14 units Q24H, decrease Novolog correction to 0-9 units TID with meals, and order Novolog 4 units TID with meals for meal coverage if patient eats at least 50% of meals.  NOTE: Patient has Type 1 DM and does not make insulin at all. Patient takes Lantus and Humalog outpatient for DM control. Patient admitted with nausea and vomiting. Initial lab glucose 116 mg/dl on 04/19/22 at 17:05. No basal insulin given since arrival to hospital. Lab glucose this morning was 387 mg/dl (CO2 8 and anion gap 22). Patient received Novolog 15 units at 8:06 am today and glucose 217 at 11:42 am today. Per chart review, noted patient sees Manley Mason, [place] (with [facility]) and had televisit on 03/17/22. Per office note on 03/17/22, A1C  was 9.9% on 03/13/22, "She admits to a fear of hypoglycemia and because of this, she takes her shot after the meal (30-60 min late). she wishes to get panc renal transplant but needs A1c <8%. Hopeful that [employer] CIQ pump can help [place] get there. She needs to complete food logs and once complete and she has pump, I will set her up for training (she will message me once she has her pump). Continue 14 u lantus in AM and 1:10 IC ratio plus SSI as 1 u for every 50 >150."   Addendum 04/20/22@15 :00-Spoke with patient at bedside. Patient lying in bed with eyes closed. She opened eyes to name and then closed eyes during rest of conversation but she answered all questions. Patient confirms that she has DM1 and reports she takes Lantus 14 units QHS ("sounds about right"), Humalog 1 unit per 8 grams of carbs and 1 unit per 50 mg/dl above target glucose of 150 mg/dl.  Asked patient to clarify if she is taking Lantus differently and she stated she rarely took any Lantus in the morning and took about 14 units at bedtime. Patient reports she uses a Dexcom CGM which was removed prior to CT. Patient states that her glucose had been running fairly good over past week and she stated it was 69 mg/dl when she arrived at the hospital on 04/19/22. Patient states she is able to tell  when her glucose is low.  Patient confirms that she goes to [facility] and she was recently prescribed an insulin pump. Patient reports that she has received the Tandem T-Slim insulin pump and she has an appointment on 04/30/22 to go in for insulin pump training and start up.  Patient reports she has everything she needs at home for DM control and she has more Dexcom CGM sensors at home so she can restart Dexcom once discharged. Discussed current A1C of 9.4% on 04/19/22 indicating an average glucose of 223 mg/dl (prior A1C 9.9% on 03/13/22). Encouraged patient to be sure to keep appointment on 04/30/22 to get started on insulin pump which should help improve DM  control. Discussed current insulin orders and glucose trends.  Patient verbalized understanding of information and has no questions at this time.  Thanks, Barnie Alderman, RN, MSN, [place] Coordinator Inpatient Diabetes Program [phone] (Team Pager from 8am to [place])

## 2022-04-21 ENCOUNTER — Encounter: Payer: Self-pay | Admitting: Internal Medicine

## 2022-04-21 DIAGNOSIS — R112 Nausea with vomiting, unspecified: Secondary | ICD-10-CM | POA: Diagnosis not present

## 2022-04-21 LAB — RENAL FUNCTION PANEL
Albumin: 2.3 g/dL — ABNORMAL LOW (ref 3.5–5.0)
Anion gap: 10 (ref 5–15)
BUN: 75 mg/dL — ABNORMAL HIGH (ref 6–20)
CO2: 17 mmol/L — ABNORMAL LOW (ref 22–32)
Calcium: 7.8 mg/dL — ABNORMAL LOW (ref 8.9–10.3)
Chloride: 109 mmol/L (ref 98–111)
Creatinine, Ser: 7.77 mg/dL — ABNORMAL HIGH (ref 0.44–1.00)
GFR, Estimated: 7 mL/min — ABNORMAL LOW (ref >60)
Glucose, Bld: 259 mg/dL — ABNORMAL HIGH (ref 70–99)
Phosphorus: 7.6 mg/dL — ABNORMAL HIGH (ref 2.5–4.6)
Potassium: 3.3 mmol/L — ABNORMAL LOW (ref 3.5–5.1)
Sodium: 136 mmol/L (ref 135–145)

## 2022-04-21 LAB — GLUCOSE, CAPILLARY
Glucose-Capillary: 279 mg/dL — ABNORMAL HIGH (ref 70–99)
Glucose-Capillary: 198 mg/dL — ABNORMAL HIGH (ref 70–99)
Glucose-Capillary: 202 mg/dL — ABNORMAL HIGH (ref 70–99)
Glucose-Capillary: 150 mg/dL — ABNORMAL HIGH (ref 70–99)

## 2022-04-21 MED ORDER — INSULIN GLARGINE-YFGN 100 UNIT/ML ~~LOC~~ SOLN
17.0000 [IU] | Freq: Every day | SUBCUTANEOUS | Status: DC
  Filled 2022-04-21: qty 0.17

## 2022-04-21 MED ORDER — METOCLOPRAMIDE HCL 5 MG/ML IJ SOLN
5.0000 mg | Freq: Once | INTRAMUSCULAR | Status: AC | PRN
  Administered 2022-04-21: 5 mg via INTRAVENOUS
  Filled 2022-04-21: qty 2

## 2022-04-21 MED ORDER — INSULIN GLARGINE-YFGN 100 UNIT/ML ~~LOC~~ SOLN
14.0000 [IU] | Freq: Every day | SUBCUTANEOUS | Status: DC
  Administered 2022-04-21 – 2022-04-22 (×2): 14 [IU] via SUBCUTANEOUS
  Filled 2022-04-21 (×3): qty 0.14

## 2022-04-21 NOTE — Progress Notes (Addendum)
Progress Note    Emma Thomas  NKN:[id number] DOB: [birth year]  DOA: 04/19/2022 PCP: [facility]      Brief Narrative:    Medical records reviewed and are as summarized below:  Emma Thomas is a 26 y.o. female with medical history significant for type I DM with frequent DKA's, hypertension, diabetic neuropathy, CKD stage IV who, who presented to the hospital with intractable nausea and vomiting.         Assessment/Plan:   Principal Problem:   Nausea and vomiting Active Problems:   Depression, major, recurrent, moderate (HCC)   Diabetes mellitus ([place])   Hypertension   Body mass index is 24.23 kg/m.  Intractable nausea and vomiting,?  Ileus: Antiemetics as needed.  Continue IV fluids for hydration.  AKI on CKD stage IV: Creatinine is trending upward.  Continue IV fluids.  Monitor BMP.  Follow-up with nephrologist for further recommendations.  Type I DM with hyperglycemia: Continue Semglee at 14 units daily.  Patient is at increased risk for hypoglycemia because of poor oral intake.  NovoLog as needed for hyperglycemia.  Other comorbidities include hypertension, depression.    Diet Order             Diet clear liquid Room service appropriate? Yes; Fluid consistency: Thin  Diet effective now                            Consultants: Nephrologist  Procedures: None    Medications:    amLODipine  10 mg Oral Daily   ferrous gluconate  324 mg Oral Q breakfast   heparin  5,000 Units Subcutaneous Q8H   insulin aspart  0-5 Units Subcutaneous QHS   insulin aspart  0-9 Units Subcutaneous TID WC   insulin glargine-yfgn  14 Units Subcutaneous Daily   montelukast  10 mg Oral QHS   multivitamin with minerals  1 tablet Oral Daily   pantoprazole  40 mg Oral Daily   sodium bicarbonate  1,300 mg Oral BID   Vitamin D (Ergocalciferol)  50,000 Units Oral Weekly   Continuous Infusions:  sodium chloride 50 mL/hr at 04/20/22 1505      Anti-infectives (From admission, onward)    None              Family Communication/Anticipated D/C date and plan/Code Status   DVT prophylaxis: heparin injection 5,000 Units Start: 04/19/22 2230     Code Status: Full Code  Family Communication: None Disposition Plan: Plan to discharge home in 2 to 3 days   Status is: Inpatient Remains inpatient appropriate because: Nausea, worsening kidney function       Subjective:   Interval events noted.  She complains of nausea.  No vomiting or diarrhea or abdominal pain.  She does not provide much history.  Objective:    Vitals:   04/21/22 0412 04/21/22 0435 04/21/22 0804 04/21/22 1532  BP: 133/84  (!) 155/96 (!) 156/98  Pulse: 88  95 (!) 102  Resp: 20  19 20   Temp: 98.9 F (37.2 C)  98.4 F (36.9 C) 98 F (36.7 C)  TempSrc: Oral  Oral Oral  SpO2: 100%  100% 99%  Weight:  68.1 kg    Height:       No data found.   Intake/Output Summary (Last 24 hours) at 04/21/2022 1623 Last data filed at 04/21/2022 1400 Gross per 24 hour  Intake 0 ml  Output 500 ml  Net -500 ml   Filed Weights   04/19/22 2224 04/20/22 0323 04/21/22 0435  Weight: 64.6 kg 65.4 kg 68.1 kg    Exam:  GEN: NAD SKIN: No rash EYES: EOMI ENT: MMM CV: RRR PULM: CTA B ABD: soft, ND, NT, +BS CNS: AAO x 3, non focal EXT: No edema or tenderness PSYCH: Mood is low       Data Reviewed:   I have personally reviewed following labs and imaging studies:  Labs: Labs show the following:   Basic Metabolic Panel: Recent Labs  Lab 04/19/22 1705 04/19/22 1813 04/20/22 0733 04/21/22 0954  NA 141  --  139 136  K 3.9  --  4.3 3.3*  CL 112*  --  109 109  CO2 17*  --  8* 17*  GLUCOSE 116*  --  387* 259*  BUN 59*  --  69* 75*  CREATININE 6.36*  --  6.91* 7.77*  CALCIUM 8.4*  --  7.7* 7.8*  MG  --  2.4  --   --   PHOS  --   --   --  7.6*   GFR Estimated Creatinine Clearance: 10.2 mL/min (A) (by C-G formula based on SCr of 7.77  mg/dL (H)). Liver Function Tests: Recent Labs  Lab 04/19/22 1705 04/20/22 0733 04/21/22 0954  AST 21 13*  --   ALT 17 14  --   ALKPHOS 52 44  --   BILITOT 0.7 1.1  --   PROT 7.4 6.3*  --   ALBUMIN 2.8* 2.5* 2.3*   Recent Labs  Lab 04/19/22 1705  LIPASE 21   No results for input(s): AMMONIA in the last 168 hours. Coagulation profile No results for input(s): INR, PROTIME in the last 168 hours.  CBC: Recent Labs  Lab 04/19/22 1705 04/20/22 0733  WBC 10.4 12.3*  NEUTROABS 9.2*  --   HGB 7.7* 7.3*  HCT 24.0* 22.8*  MCV 88.6 90.1  PLT 326 299   Cardiac Enzymes: No results for input(s): CKTOTAL, CKMB, CKMBINDEX, TROPONINI in the last 168 hours. BNP (last 3 results) No results for input(s): PROBNP in the last 8760 hours. CBG: Recent Labs  Lab 04/20/22 1704 04/20/22 2041 04/21/22 0805 04/21/22 1124 04/21/22 1618  GLUCAP 219* 168* 279* 198* 202*   D-Dimer: No results for input(s): DDIMER in the last 72 hours. Hgb A1c: Recent Labs    04/19/22 1705  HGBA1C 9.4*   Lipid Profile: No results for input(s): CHOL, HDL, LDLCALC, TRIG, CHOLHDL, LDLDIRECT in the last 72 hours. Thyroid function studies: No results for input(s): TSH, T4TOTAL, T3FREE, THYROIDAB in the last 72 hours.  Invalid input(s): FREET3 Anemia work up: No results for input(s): VITAMINB12, FOLATE, FERRITIN, TIBC, IRON, RETICCTPCT in the last 72 hours. Sepsis Labs: Recent Labs  Lab 04/19/22 1705 04/20/22 0733  WBC 10.4 12.3*    Microbiology No results found for this or any previous visit (from the past 240 hour(s)).  Procedures and diagnostic studies:  CT ABDOMEN PELVIS WO CONTRAST  Result Date: 04/19/2022 CLINICAL DATA:  Abdominal pain, nausea, vomiting, diarrhea EXAM: CT ABDOMEN AND PELVIS WITHOUT CONTRAST TECHNIQUE: Multidetector CT imaging of the abdomen and pelvis was performed following the standard protocol without IV contrast. RADIATION DOSE REDUCTION: This exam was performed  according to the departmental dose-optimization program which includes automated exposure control, adjustment of the mA and/or kV according to patient size and/or use of iterative reconstruction technique. COMPARISON:  None Available. FINDINGS: Lower chest: Unremarkable. Hepatobiliary: No focal abnormality is  seen in the liver. There is no dilation of bile ducts. Gallbladder is slightly distended. There is no wall thickening. There is increased density in the dependent portion of gallbladder lumen. Pancreas: There is atrophy.  No focal abnormality is seen. Spleen: Unremarkable. Adrenals/Urinary Tract: Adrenals are unremarkable. There is no hydronephrosis. 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 There are no renal or ureteral stones. Urinary bladder is unremarkable. Stomach/Bowel: Small hiatal hernia is seen. There is wall thickening in the lower thoracic esophagus possibly suggesting reflux esophagitis. There is mild diffuse wall thickening in the small bowel loops. There is minimal stranding adjacent to the small bowel loops. Appendix is unremarkable. There is mild diffuse wall thickening in the colon. This may be due to incomplete distention or suggest nonspecific inflammation. Vascular/Lymphatic: Unremarkable. Reproductive: Unremarkable. Other: There is minimal ascites. There is no pneumoperitoneum. Small umbilical hernia containing fat is seen. Musculoskeletal: Unremarkable. IMPRESSION: There is no evidence of intestinal obstruction or pneumoperitoneum. There is no hydronephrosis. Appendix is not dilated. There is mild diffuse wall thickening in the small bowel loops and colon. This may be due to incomplete distention or suggest nonspecific enterocolitis. There is minimal stranding in the fat planes adjacent to small bowel loops. There is minimal ascites. Subtle increased density is seen in the dependent portion of gallbladder lumen suggesting presence of sludge and possibly tiny stones.  There are no signs of acute cholecystitis. There is no dilation of bile ducts. Electronically Signed   By: Elmer Picker M.D.   On: 04/19/2022 19:20               LOS: 2 days   Akansha Wyche  [facility]   Pager on [email or web address]. If 7PM-7AM, please contact night-coverage at [email or web address]     04/21/2022, 4:23 PM

## 2022-04-21 NOTE — Progress Notes (Signed)
Inpatient Diabetes Program Recommendations  AACE/ADA: New Consensus Statement on Inpatient Glycemic Control   Target Ranges:  Prepandial:   less than 140 mg/dL      Peak postprandial:   less than 180 mg/dL (1-2 hours)      Critically ill patients:  140 - 180 mg/dL    Latest Reference Range & Units 04/21/22 08:05  Glucose-Capillary 70 - 99 mg/dL 279 (H)    Latest Reference Range & Units 04/20/22 07:56 04/20/22 11:42 04/20/22 17:04 04/20/22 20:41  Glucose-Capillary 70 - 99 mg/dL 359 (H) 217 (H) 219 (H) 168 (H)   Review of Glycemic Control  Diabetes history: DM1 (does NOT make insulin; requires basal, correction, and carbohydrate coverage insulin) Outpatient Diabetes medications: Lantus 14 units QHS, Humalog TID for meal coverage and correction (1 unit per 8 grams of carbs, 1 unit for every 50 mg/dl above 150 mg/dl) Current orders for Inpatient glycemic control: Semglee 14 units daily, Novolog 0-9 units TID with meals, Novolog 0-5 units QHS  Inpatient Diabetes Program Recommendations:    Insulin: Fasting glucose 279 mg/dl today. Please consider increasing Semglee to 17 units daily. Once diet is advanced and patient eating well, please consider ordering Novolog 4 units TID with meals for meal coverage if patient eats at least 50% of meals.  Thanks, Barnie Alderman, RN, MSN, [place] [facility] Coordinator Inpatient Diabetes Program [phone] (Team Pager from 8am to [place])

## 2022-04-21 NOTE — Progress Notes (Addendum)
[facility]  ROUNDING NOTE   Subjective:   Patient resting quietly Continues to complain of nausea, unable to tolerate po intake Remains on IVF  Objective:  Vital signs in last 24 hours:  Temp:  [98.4 F (36.9 C)-100.2 F (37.9 C)] 98.4 F (36.9 C) (06/06 0804) Pulse Rate:  [88-98] 95 (06/06 0804) Resp:  [18-23] 19 (06/06 0804) BP: (133-155)/(81-96) 155/96 (06/06 0804) SpO2:  [100 %] 100 % (06/06 0804) Weight:  [68.1 kg] 68.1 kg (06/06 0435)  Weight change: 4.596 kg Filed Weights   04/19/22 2224 04/20/22 0323 04/21/22 0435  Weight: 64.6 kg 65.4 kg 68.1 kg    Intake/Output: I/O last 3 completed shifts: In: 894.9 [P.O.:760; I.V.:134.9] Out: 300 [Urine:300]   Intake/Output this shift:  No intake/output data recorded.  Physical Exam: General: NAD  Head: Normocephalic, atraumatic. Moist oral mucosal membranes  Eyes: Anicteric  Lungs:  Clear to auscultation, normal effort  Heart: Regular rate and rhythm  Abdomen:  Soft, nontender  Extremities:  No peripheral edema.  Neurologic: Nonfocal, moving all four extremities  Skin: No lesions  Access: Rt AVF (maturing)    Basic Metabolic Panel: Recent Labs  Lab 04/19/22 1705 04/19/22 1813 04/20/22 0733 04/21/22 0954  NA 141  --  139 136  K 3.9  --  4.3 3.3*  CL 112*  --  109 109  CO2 17*  --  8* 17*  GLUCOSE 116*  --  387* 259*  BUN 59*  --  69* 75*  CREATININE 6.36*  --  6.91* 7.77*  CALCIUM 8.4*  --  7.7* 7.8*  MG  --  2.4  --   --   PHOS  --   --   --  7.6*    Liver Function Tests: Recent Labs  Lab 04/19/22 1705 04/20/22 0733 04/21/22 0954  AST 21 13*  --   ALT 17 14  --   ALKPHOS 52 44  --   BILITOT 0.7 1.1  --   PROT 7.4 6.3*  --   ALBUMIN 2.8* 2.5* 2.3*   Recent Labs  Lab 04/19/22 1705  LIPASE 21   No results for input(s): AMMONIA in the last 168 hours.  CBC: Recent Labs  Lab 04/19/22 1705 04/20/22 0733  WBC 10.4 12.3*  NEUTROABS 9.2*  --   HGB 7.7* 7.3*  HCT 24.0*  22.8*  MCV 88.6 90.1  PLT 326 299    Cardiac Enzymes: No results for input(s): CKTOTAL, CKMB, CKMBINDEX, TROPONINI in the last 168 hours.  BNP: Invalid input(s): POCBNP  CBG: Recent Labs  Lab 04/20/22 1142 04/20/22 1704 04/20/22 2041 04/21/22 0805 04/21/22 1124  GLUCAP 217* 219* 168* 279* 198*    Microbiology: Results for orders placed or performed during the hospital encounter of 02/13/20  Respiratory Panel by RT PCR (Flu A&B, Covid) - Nasopharyngeal Swab     Status: None   Collection Time: 02/13/20  2:41 PM   Specimen: Nasopharyngeal Swab  Result Value Ref Range Status   SARS Coronavirus 2 by RT PCR NEGATIVE NEGATIVE Final    Comment: (NOTE) SARS-CoV-2 target nucleic acids are NOT DETECTED. The SARS-CoV-2 RNA is generally detectable in upper respiratoy specimens during the acute phase of infection. The lowest concentration of SARS-CoV-2 viral copies this assay can detect is 131 copies/mL. A negative result does not preclude SARS-Cov-2 infection and should not be used as the sole basis for treatment or other patient management decisions. A negative result may occur with  improper specimen collection/handling,  submission of specimen other than nasopharyngeal swab, presence of viral mutation(s) within the areas targeted by this assay, and inadequate number of viral copies (<131 copies/mL). A negative result must be combined with clinical observations, patient history, and epidemiological information. The expected result is Negative. Fact Sheet for Patients:  PinkCheek.be Fact Sheet for Healthcare Providers:  GravelBags.it This test is not yet ap proved or cleared by the [place] FDA and  has been authorized for detection and/or diagnosis of SARS-CoV-2 by FDA under an Emergency Use Authorization (EUA). This EUA will remain  in effect (meaning this test can be used) for the duration of the COVID-19  declaration under Section 564(b)(1) of the Act, 21 U.S.C. section 360bbb-3(b)(1), unless the authorization is terminated or revoked sooner.    Influenza A by PCR NEGATIVE NEGATIVE Final   Influenza B by PCR NEGATIVE NEGATIVE Final    Comment: (NOTE) The Xpert Xpress SARS-CoV-2/FLU/RSV assay is intended as an aid in  the diagnosis of influenza from Nasopharyngeal swab specimens and  should not be used as a sole basis for treatment. Nasal washings and  aspirates are unacceptable for Xpert Xpress SARS-CoV-2/FLU/RSV  testing. Fact Sheet for Patients: PinkCheek.be Fact Sheet for Healthcare Providers: GravelBags.it This test is not yet approved or cleared by the [place] FDA and  has been authorized for detection and/or diagnosis of SARS-CoV-2 by  FDA under an Emergency Use Authorization (EUA). This EUA will remain  in effect (meaning this test can be used) for the duration of the  Covid-19 declaration under Section 564(b)(1) of the Act, 21  U.S.C. section 360bbb-3(b)(1), unless the authorization is  terminated or revoked. Performed at [facility], [address] [place], [place] [address]     Coagulation Studies: No results for input(s): LABPROT, INR in the last 72 hours.  Urinalysis: Recent Labs    04/19/22 1924  COLORURINE YELLOW*  LABSPEC 1.014  PHURINE 7.0  GLUCOSEU 150*  HGBUR SMALL*  BILIRUBINUR NEGATIVE  KETONESUR 20*  PROTEINUR >=300*  NITRITE NEGATIVE  LEUKOCYTESUR NEGATIVE      Imaging: CT ABDOMEN PELVIS WO CONTRAST  Result Date: 04/19/2022 CLINICAL DATA:  Abdominal pain, nausea, vomiting, diarrhea EXAM: CT ABDOMEN AND PELVIS WITHOUT CONTRAST TECHNIQUE: Multidetector CT imaging of the abdomen and pelvis was performed following the standard protocol without IV contrast. RADIATION DOSE REDUCTION: This exam was performed according to the departmental dose-optimization program which includes  automated exposure control, adjustment of the mA and/or kV according to patient size and/or use of iterative reconstruction technique. COMPARISON:  None Available. FINDINGS: Lower chest: Unremarkable. Hepatobiliary: No focal abnormality is seen in the liver. There is no dilation of bile ducts. Gallbladder is slightly distended. There is no wall thickening. There is increased density in the dependent portion of gallbladder lumen. Pancreas: There is atrophy.  No focal abnormality is seen. Spleen: Unremarkable. Adrenals/Urinary Tract: Adrenals are unremarkable. There is no hydronephrosis. 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 There are no renal or ureteral stones. Urinary bladder is unremarkable. Stomach/Bowel: Small hiatal hernia is seen. There is wall thickening in the lower thoracic esophagus possibly suggesting reflux esophagitis. There is mild diffuse wall thickening in the small bowel loops. There is minimal stranding adjacent to the small bowel loops. Appendix is unremarkable. There is mild diffuse wall thickening in the colon. This may be due to incomplete distention or suggest nonspecific inflammation. Vascular/Lymphatic: Unremarkable. Reproductive: Unremarkable. Other: There is minimal ascites. There is no pneumoperitoneum. Small umbilical hernia containing  fat is seen. Musculoskeletal: Unremarkable. IMPRESSION: There is no evidence of intestinal obstruction or pneumoperitoneum. There is no hydronephrosis. Appendix is not dilated. There is mild diffuse wall thickening in the small bowel loops and colon. This may be due to incomplete distention or suggest nonspecific enterocolitis. There is minimal stranding in the fat planes adjacent to small bowel loops. There is minimal ascites. Subtle increased density is seen in the dependent portion of gallbladder lumen suggesting presence of sludge and possibly tiny stones. There are no signs of acute cholecystitis. There is no dilation of bile  ducts. Electronically Signed   By: Elmer Picker M.D.   On: 04/19/2022 19:20     Medications:    sodium chloride 50 mL/hr at 04/20/22 1505    amLODipine  10 mg Oral Daily   ferrous gluconate  324 mg Oral Q breakfast   heparin  5,000 Units Subcutaneous Q8H   insulin aspart  0-5 Units Subcutaneous QHS   insulin aspart  0-9 Units Subcutaneous TID WC   insulin glargine-yfgn  14 Units Subcutaneous Daily   montelukast  10 mg Oral QHS   multivitamin with minerals  1 tablet Oral Daily   pantoprazole  40 mg Oral Daily   sodium bicarbonate  1,300 mg Oral BID   Vitamin D (Ergocalciferol)  50,000 Units Oral Weekly   acetaminophen **OR** acetaminophen, calcium carbonate (dosed in mg elemental calcium), camphor-menthol **AND** hydrOXYzine, docusate sodium, feeding supplement (NEPRO CARB STEADY), labetalol, ondansetron **OR** ondansetron (ZOFRAN) IV, prochlorperazine, sorbitol, zolpidem  Assessment/ Plan:  [name] is a 26 y.o.  female with type 1 diabetes, chronic kidney disease V, and hypertenison, who was admitted to [facility] on 04/19/2022 for Gastroenteritis [K52.9] Nausea and vomiting, unspecified vomiting type [R11.2]   Acute kidney injury with hypokalemia on chronic kidney disease stage V.  Baseline creatinine appears to be 5.0 with GFR 11 on 03/13/2022.  AV fistula placed on 03/19/2022, followed by [facility] physicians.  Acute kidney injury likely secondary to hypovolemia and poor oral intake.  Patient reports nausea and vomiting after alcohol consumption on Saturday.  CT abdomen pelvis negative for renal obstruction.  No acute indication for dialysis at this time.   Renal function continues to decline with BUN elevation. No urine output recorded, questioning staff nurse. Plan was to attempt to regain some renal function to return patein to previous outpatient  plans. However, if renal function continues to rise with no recorded urine output, may need to start dialysis this admission.  Potassium 3.3.  Monitoring very closely  Lab Results  Component Value Date   CREATININE 7.77 (H) 04/21/2022   CREATININE 6.91 (H) 04/20/2022   CREATININE 6.36 (H) 04/19/2022    Intake/Output Summary (Last 24 hours) at 04/21/2022 1221 Last data filed at 04/21/2022 1026 Gross per 24 hour  Intake 734.93 ml  Output --  Net 734.93 ml   2. Anemia of chronic kidney disease Lab Results  Component Value Date   HGB 7.3 (L) 04/20/2022    May require ESA's. Will continue to monitor.  Would avoid blood transfusion due to transplant evaluation.  3. Secondary Hyperparathyroidism:  Lab Results  Component Value Date   CALCIUM 7.8 (L) 04/21/2022   PHOS 7.6 (H) 04/21/2022   Bone minerals not within goal.  Continue vitamin D and calcium carbonate.  4.  Diabetes mellitus type 1 with chronic kidney disease.  Insulin-dependent on Humalog and Lantus.  Last hemoglobin A1c 9.4 on 04/19/2022.  Glucose slightly elevated during this admission.  Primary team to manage sliding scale insulin.    LOS: 2   6/6/202312:21 PM

## 2022-04-22 ENCOUNTER — Inpatient Hospital Stay: Payer: Commercial Managed Care - PPO

## 2022-04-22 DIAGNOSIS — N186 End stage renal disease: Secondary | ICD-10-CM | POA: Diagnosis present

## 2022-04-22 DIAGNOSIS — N179 Acute kidney failure, unspecified: Principal | ICD-10-CM

## 2022-04-22 DIAGNOSIS — N184 Chronic kidney disease, stage 4 (severe): Secondary | ICD-10-CM

## 2022-04-22 DIAGNOSIS — R112 Nausea with vomiting, unspecified: Secondary | ICD-10-CM | POA: Diagnosis not present

## 2022-04-22 LAB — RENAL FUNCTION PANEL
Albumin: 2.2 g/dL — ABNORMAL LOW (ref 3.5–5.0)
Anion gap: 10 (ref 5–15)
BUN: 72 mg/dL — ABNORMAL HIGH (ref 6–20)
CO2: 15 mmol/L — ABNORMAL LOW (ref 22–32)
Calcium: 7.7 mg/dL — ABNORMAL LOW (ref 8.9–10.3)
Chloride: 110 mmol/L (ref 98–111)
Creatinine, Ser: 7.74 mg/dL — ABNORMAL HIGH (ref 0.44–1.00)
GFR, Estimated: 7 mL/min — ABNORMAL LOW (ref >60)
Glucose, Bld: 182 mg/dL — ABNORMAL HIGH (ref 70–99)
Phosphorus: 6.9 mg/dL — ABNORMAL HIGH (ref 2.5–4.6)
Potassium: 3.4 mmol/L — ABNORMAL LOW (ref 3.5–5.1)
Sodium: 135 mmol/L (ref 135–145)

## 2022-04-22 LAB — GLUCOSE, CAPILLARY
Glucose-Capillary: 172 mg/dL — ABNORMAL HIGH (ref 70–99)
Glucose-Capillary: 119 mg/dL — ABNORMAL HIGH (ref 70–99)
Glucose-Capillary: 85 mg/dL (ref 70–99)
Glucose-Capillary: 89 mg/dL (ref 70–99)

## 2022-04-22 LAB — CBC
HCT: 25.3 % — ABNORMAL LOW (ref 36.0–46.0)
Hemoglobin: 8.4 g/dL — ABNORMAL LOW (ref 12.0–15.0)
MCH: 29.3 pg (ref 26.0–34.0)
MCHC: 33.2 g/dL (ref 30.0–36.0)
MCV: 88.2 fL (ref 80.0–100.0)
Platelets: 314 10*3/uL (ref 150–400)
RBC: 2.87 MIL/uL — ABNORMAL LOW (ref 3.87–5.11)
RDW: 12.2 % (ref 11.5–15.5)
WBC: 8.8 10*3/uL (ref 4.0–10.5)
nRBC: 0 % (ref 0.0–0.2)

## 2022-04-22 LAB — MAGNESIUM: Magnesium: 2.5 mg/dL — ABNORMAL HIGH (ref 1.7–2.4)

## 2022-04-22 MED ORDER — PANTOPRAZOLE SODIUM 40 MG PO TBEC
40.0000 mg | DELAYED_RELEASE_TABLET | Freq: Two times a day (BID) | ORAL | Status: DC
  Administered 2022-04-22 – 2022-04-27 (×10): 40 mg via ORAL
  Filled 2022-04-22 (×10): qty 1

## 2022-04-22 MED ORDER — SODIUM CHLORIDE 0.9 % IV SOLN
12.5000 mg | Freq: Four times a day (QID) | INTRAVENOUS | Status: DC | PRN
  Administered 2022-04-22 – 2022-04-24 (×8): 12.5 mg via INTRAVENOUS
  Filled 2022-04-22 (×5): qty 12.5
  Filled 2022-04-22 (×4): qty 0.5

## 2022-04-22 MED ORDER — METOCLOPRAMIDE HCL 5 MG/ML IJ SOLN
10.0000 mg | Freq: Once | INTRAMUSCULAR | Status: AC
  Administered 2022-04-22: 10 mg via INTRAVENOUS
  Filled 2022-04-22: qty 2

## 2022-04-22 MED ORDER — NEPRO/CARBSTEADY PO LIQD
237.0000 mL | Freq: Three times a day (TID) | ORAL | Status: DC
  Administered 2022-04-23: 237 mL via ORAL

## 2022-04-22 NOTE — Progress Notes (Addendum)
Progress Note    EMIKO OSORTO  DJM:[id number] DOB: Jan 19, 1996  DOA: 04/19/2022 PCP: [facility]      Brief Narrative:    Medical records reviewed and are as summarized below:  Emma Thomas is a 26 y.o. female with medical history significant for type I DM with frequent DKA's, hypertension, diabetic neuropathy, CKD stage IV who, who presented to the hospital with intractable nausea and vomiting.         Assessment/Plan:   Principal Problem:   Nausea and vomiting Active Problems:   AKI (acute kidney injury) (HCC)   Depression, major, recurrent, moderate (HCC)   Diabetes mellitus (HCC)   Hypertension   CKD (chronic kidney disease) stage 4, GFR 15-29 ml/min (HCC)   Body mass index is 24.37 kg/m.  Intractable nausea and vomiting,?  Ileus: Patient's reported poor response to prescribed antiemetics (Reglan, Compazine, Zofran).  IV Phenergan has been ordered to control nausea.  Continue IV fluids for hydration.  AKI on CKD stage IV, secondary hyperparathyroidism: No significant improvement in creatinine.  Creatinine is down from 7.77-7.74.  Continue IV fluids.  Monitor BMP.  Follow-up with nephrologist for further recommendations.  Type I DM with hyperglycemia: Continue Semglee at 14 units daily.  Patient is at increased risk for hypoglycemia because of poor oral intake.  NovoLog as needed for hyperglycemia.  Other comorbidities include hypertension, depression, anemia of chronic disease    Diet Order             Diet clear liquid Room service appropriate? Yes; Fluid consistency: Thin  Diet effective now                            Consultants: Nephrologist  Procedures: None    Medications:    amLODipine  10 mg Oral Daily   feeding supplement (NEPRO CARB STEADY)  237 mL Oral TID WC   ferrous gluconate  324 mg Oral Q breakfast   heparin  5,000 Units Subcutaneous Q8H   insulin aspart  0-5 Units Subcutaneous QHS    insulin aspart  0-9 Units Subcutaneous TID WC   insulin glargine-yfgn  14 Units Subcutaneous Daily   montelukast  10 mg Oral QHS   multivitamin with minerals  1 tablet Oral Daily   pantoprazole  40 mg Oral Daily   sodium bicarbonate  1,300 mg Oral BID   Vitamin D (Ergocalciferol)  50,000 Units Oral Weekly   Continuous Infusions:  sodium chloride 50 mL/hr at 04/22/22 0243   promethazine (PHENERGAN) injection (IM or IVPB) 12.5 mg (04/22/22 1251)     Anti-infectives (From admission, onward)    None              Family Communication/Anticipated D/C date and plan/Code Status   DVT prophylaxis: heparin injection 5,000 Units Start: 04/19/22 2230     Code Status: Full Code  Family Communication: None Disposition Plan: Plan to discharge home in 2 to 3 days   Status is: Inpatient Remains inpatient appropriate because: Nausea, worsening kidney function       Subjective:   C/o nausea and poor oral intake.  No vomiting or abdominal pain.  Objective:    Vitals:   04/22/22 0241 04/22/22 0500 04/22/22 0802 04/22/22 1530  BP: (!) 151/99  (!) 162/100 130/78  Pulse: (!) 101  97 100  Resp: 16  18 18   Temp: 98.9 F (37.2 C)  99 F (37.2 C) 98.3  F (36.8 C)  TempSrc:   Oral Oral  SpO2: 100%  100% 100%  Weight:  68.5 kg    Height:       No data found.   Intake/Output Summary (Last 24 hours) at 04/22/2022 1545 Last data filed at 04/22/2022 1440 Gross per 24 hour  Intake 0 ml  Output 200 ml  Net -200 ml   Filed Weights   04/20/22 0323 04/21/22 0435 04/22/22 0500  Weight: 65.4 kg 68.1 kg 68.5 kg    Exam:  GEN: NAD SKIN: No rash EYES: EOMI ENT: MMM CV: RRR PULM: CTA B ABD: soft, ND, NT, +BS CNS: AAO x 3, non focal EXT: No edema or tenderness        Data Reviewed:   I have personally reviewed following labs and imaging studies:  Labs: Labs show the following:   Basic Metabolic Panel: Recent Labs  Lab 04/19/22 1705 04/19/22 1813  04/20/22 0733 04/21/22 0954 04/22/22 0537  NA 141  --  139 136 135  K 3.9  --  4.3 3.3* 3.4*  CL 112*  --  109 109 110  CO2 17*  --  8* 17* 15*  GLUCOSE 116*  --  387* 259* 182*  BUN 59*  --  69* 75* 72*  CREATININE 6.36*  --  6.91* 7.77* 7.74*  CALCIUM 8.4*  --  7.7* 7.8* 7.7*  MG  --  2.4  --   --  2.5*  PHOS  --   --   --  7.6* 6.9*   GFR Estimated Creatinine Clearance: 10.2 mL/min (A) (by C-G formula based on SCr of 7.74 mg/dL (H)). Liver Function Tests: Recent Labs  Lab 04/19/22 1705 04/20/22 0733 04/21/22 0954 04/22/22 0537  AST 21 13*  --   --   ALT 17 14  --   --   ALKPHOS 52 44  --   --   BILITOT 0.7 1.1  --   --   PROT 7.4 6.3*  --   --   ALBUMIN 2.8* 2.5* 2.3* 2.2*   Recent Labs  Lab 04/19/22 1705  LIPASE 21   No results for input(s): AMMONIA in the last 168 hours. Coagulation profile No results for input(s): INR, PROTIME in the last 168 hours.  CBC: Recent Labs  Lab 04/19/22 1705 04/20/22 0733 04/22/22 0537  WBC 10.4 12.3* 8.8  NEUTROABS 9.2*  --   --   HGB 7.7* 7.3* 8.4*  HCT 24.0* 22.8* 25.3*  MCV 88.6 90.1 88.2  PLT 326 299 314   Cardiac Enzymes: No results for input(s): CKTOTAL, CKMB, CKMBINDEX, TROPONINI in the last 168 hours. BNP (last 3 results) No results for input(s): PROBNP in the last 8760 hours. CBG: Recent Labs  Lab 04/21/22 1124 04/21/22 1618 04/21/22 2151 04/22/22 0803 04/22/22 1138  GLUCAP 198* 202* 150* 172* 119*   D-Dimer: No results for input(s): DDIMER in the last 72 hours. Hgb A1c: Recent Labs    04/19/22 1705  HGBA1C 9.4*   Lipid Profile: No results for input(s): CHOL, HDL, LDLCALC, TRIG, CHOLHDL, LDLDIRECT in the last 72 hours. Thyroid function studies: No results for input(s): TSH, T4TOTAL, T3FREE, THYROIDAB in the last 72 hours.  Invalid input(s): FREET3 Anemia work up: No results for input(s): VITAMINB12, FOLATE, FERRITIN, TIBC, IRON, RETICCTPCT in the last 72 hours. Sepsis Labs: Recent Labs   Lab 04/19/22 1705 04/20/22 0733 04/22/22 0537  WBC 10.4 12.3* 8.8    Microbiology No results found for this or any  previous visit (from the past 240 hour(s)).  Procedures and diagnostic studies:  No results found.             LOS: 3 days   Ayyub Krall  [facility]   Pager on [email or web address]. If 7PM-7AM, please contact night-coverage at [email or web address]     04/22/2022, 3:45 PM

## 2022-04-22 NOTE — Progress Notes (Addendum)
[facility]  ROUNDING NOTE   Subjective:   Patient seen lying in bed, alert and oriented States she feels better today, less nausea Has not attempted to eat or drink anything.  Creatinine 7.74   Objective:  Vital signs in last 24 hours:  Temp:  [98 F (36.7 C)-99 F (37.2 C)] 99 F (37.2 C) (06/07 0802) Pulse Rate:  [94-102] 97 (06/07 0802) Resp:  [16-20] 18 (06/07 0802) BP: (151-162)/(98-100) 162/100 (06/07 0802) SpO2:  [99 %-100 %] 100 % (06/07 0802) Weight:  [68.5 kg] 68.5 kg (06/07 0500)  Weight change: 0.393 kg Filed Weights   04/20/22 0323 04/21/22 0435 04/22/22 0500  Weight: 65.4 kg 68.1 kg 68.5 kg    Intake/Output: I/O last 3 completed shifts: In: 0  Out: 700 [Urine:700]   Intake/Output this shift:  No intake/output data recorded.  Physical Exam: General: NAD  Head: Normocephalic, atraumatic. Moist oral mucosal membranes  Eyes: Anicteric  Lungs:  Clear to auscultation, normal effort  Heart: Regular rate and rhythm  Abdomen:  Soft, nontender  Extremities:  No peripheral edema.  Neurologic: Nonfocal, moving all four extremities  Skin: No lesions  Access: Rt AVF (maturing)    Basic Metabolic Panel: Recent Labs  Lab 04/19/22 1705 04/19/22 1813 04/20/22 0733 04/21/22 0954 04/22/22 0537  NA 141  --  139 136 135  K 3.9  --  4.3 3.3* 3.4*  CL 112*  --  109 109 110  CO2 17*  --  8* 17* 15*  GLUCOSE 116*  --  387* 259* 182*  BUN 59*  --  69* 75* 72*  CREATININE 6.36*  --  6.91* 7.77* 7.74*  CALCIUM 8.4*  --  7.7* 7.8* 7.7*  MG  --  2.4  --   --  2.5*  PHOS  --   --   --  7.6* 6.9*     Liver Function Tests: Recent Labs  Lab 04/19/22 1705 04/20/22 0733 04/21/22 0954 04/22/22 0537  AST 21 13*  --   --   ALT 17 14  --   --   ALKPHOS 52 44  --   --   BILITOT 0.7 1.1  --   --   PROT 7.4 6.3*  --   --   ALBUMIN 2.8* 2.5* 2.3* 2.2*    Recent Labs  Lab 04/19/22 1705  LIPASE 21    No results for input(s): AMMONIA in the  last 168 hours.  CBC: Recent Labs  Lab 04/19/22 1705 04/20/22 0733 04/22/22 0537  WBC 10.4 12.3* 8.8  NEUTROABS 9.2*  --   --   HGB 7.7* 7.3* 8.4*  HCT 24.0* 22.8* 25.3*  MCV 88.6 90.1 88.2  PLT 326 299 314     Cardiac Enzymes: No results for input(s): CKTOTAL, CKMB, CKMBINDEX, TROPONINI in the last 168 hours.  BNP: Invalid input(s): POCBNP  CBG: Recent Labs  Lab 04/21/22 1124 04/21/22 1618 04/21/22 2151 04/22/22 0803 04/22/22 1138  GLUCAP 198* 202* 150* 172* 119*     Microbiology: Results for orders placed or performed during the hospital encounter of 02/13/20  Respiratory Panel by RT PCR (Flu A&B, Covid) - Nasopharyngeal Swab     Status: None   Collection Time: 02/13/20  2:41 PM   Specimen: Nasopharyngeal Swab  Result Value Ref Range Status   SARS Coronavirus 2 by RT PCR NEGATIVE NEGATIVE Final    Comment: (NOTE) SARS-CoV-2 target nucleic acids are NOT DETECTED. The SARS-CoV-2 RNA is generally detectable in upper  respiratoy specimens during the acute phase of infection. The lowest concentration of SARS-CoV-2 viral copies this assay can detect is 131 copies/mL. A negative result does not preclude SARS-Cov-2 infection and should not be used as the sole basis for treatment or other patient management decisions. A negative result may occur with  improper specimen collection/handling, submission of specimen other than nasopharyngeal swab, presence of viral mutation(s) within the areas targeted by this assay, and inadequate number of viral copies (<131 copies/mL). A negative result must be combined with clinical observations, patient history, and epidemiological information. The expected result is Negative. Fact Sheet for Patients:  PinkCheek.be Fact Sheet for Healthcare Providers:  GravelBags.it This test is not yet ap proved or cleared by the [place] FDA and  has been authorized for detection  and/or diagnosis of SARS-CoV-2 by FDA under an Emergency Use Authorization (EUA). This EUA will remain  in effect (meaning this test can be used) for the duration of the COVID-19 declaration under Section 564(b)(1) of the Act, 21 U.S.C. section 360bbb-3(b)(1), unless the authorization is terminated or revoked sooner.    Influenza A by PCR NEGATIVE NEGATIVE Final   Influenza B by PCR NEGATIVE NEGATIVE Final    Comment: (NOTE) The Xpert Xpress SARS-CoV-2/FLU/RSV assay is intended as an aid in  the diagnosis of influenza from Nasopharyngeal swab specimens and  should not be used as a sole basis for treatment. Nasal washings and  aspirates are unacceptable for Xpert Xpress SARS-CoV-2/FLU/RSV  testing. Fact Sheet for Patients: PinkCheek.be Fact Sheet for Healthcare Providers: GravelBags.it This test is not yet approved or cleared by the [place] FDA and  has been authorized for detection and/or diagnosis of SARS-CoV-2 by  FDA under an Emergency Use Authorization (EUA). This EUA will remain  in effect (meaning this test can be used) for the duration of the  Covid-19 declaration under Section 564(b)(1) of the Act, 21  U.S.C. section 360bbb-3(b)(1), unless the authorization is  terminated or revoked. Performed at [facility], [address] [place], [place] [address]     Coagulation Studies: No results for input(s): LABPROT, INR in the last 72 hours.  Urinalysis: Recent Labs    04/19/22 1924  COLORURINE YELLOW*  LABSPEC 1.014  PHURINE 7.0  GLUCOSEU 150*  HGBUR SMALL*  BILIRUBINUR NEGATIVE  KETONESUR 20*  PROTEINUR >=300*  NITRITE NEGATIVE  LEUKOCYTESUR NEGATIVE       Imaging: No results found.   Medications:    sodium chloride 50 mL/hr at 04/22/22 0243   promethazine (PHENERGAN) injection (IM or IVPB)      amLODipine  10 mg Oral Daily   feeding supplement (NEPRO CARB STEADY)  237 mL Oral  TID WC   ferrous gluconate  324 mg Oral Q breakfast   heparin  5,000 Units Subcutaneous Q8H   insulin aspart  0-5 Units Subcutaneous QHS   insulin aspart  0-9 Units Subcutaneous TID WC   insulin glargine-yfgn  14 Units Subcutaneous Daily   montelukast  10 mg Oral QHS   multivitamin with minerals  1 tablet Oral Daily   pantoprazole  40 mg Oral Daily   sodium bicarbonate  1,300 mg Oral BID   Vitamin D (Ergocalciferol)  50,000 Units Oral Weekly   acetaminophen **OR** acetaminophen, calcium carbonate (dosed in mg elemental calcium), camphor-menthol **AND** hydrOXYzine, docusate sodium, labetalol, ondansetron **OR** ondansetron (ZOFRAN) IV, prochlorperazine, promethazine (PHENERGAN) injection (IM or IVPB), sorbitol, zolpidem  Assessment/ Plan:  Emma Thomas is a 26 y.o.  female  with type 1 diabetes, chronic kidney disease V, and hypertenison, who was admitted to [facility] on 04/19/2022 for Gastroenteritis [K52.9] Nausea and vomiting, unspecified vomiting type [R11.2]   Acute kidney injury with hypokalemia on chronic kidney disease stage V.  Baseline creatinine appears to be 5.0 with GFR 11 on 03/13/2022.  AV fistula placed on 03/19/2022, followed by [facility] physicians.  Acute kidney injury likely secondary to hypovolemia and poor oral intake.  Patient reports nausea and vomiting after alcohol consumption on Saturday.  CT abdomen pelvis negative for renal obstruction.  No acute indication for dialysis at this time.   Renal function appears stable with decent urine output recorded yesterday.  Patient continues to have little oral intake, remains on IV fluids.  Discussed with patient the possible need to initiate dialysis this admission, patient agreeable if needed.  We will determine in 1 to 2 days if this is required.  If dialysis is required, will consult vascular for placement of permacath as access placed earlier this month has not matured. Hep B and chest xray ordered if dialysis initiation is required.   Patient encouraged to slowly increase oral intake with bland meals, requesting antiemetic as needed.  Continue supportive treatment and avoidance of nephrotoxic agents and therapies.  Potassium slightly improved, 3.4.  We will continue to monitor closely.  Lab Results  Component Value Date   CREATININE 7.74 (H) 04/22/2022   CREATININE 7.77 (H) 04/21/2022   CREATININE 6.91 (H) 04/20/2022    Intake/Output Summary (Last 24 hours) at 04/22/2022 1223 Last data filed at 04/22/2022 0243 Gross per 24 hour  Intake 0 ml  Output 700 ml  Net -700 ml    2. Anemia of chronic kidney disease Lab Results  Component Value Date   HGB 8.4 (L) 04/22/2022    Hemoglobin remains decreased and patient may require ESA.  We will continue to monitor this.  Due to transplant candidacy, would avoid blood transfusions, if possible  3. Secondary Hyperparathyroidism:  Lab Results  Component Value Date   CALCIUM 7.7 (L) 04/22/2022   PHOS 6.9 (H) 04/22/2022  Calcium and phosphorus are outside of desired range.  Prescribed calcium carbonate as needed, will consider scheduled dosing with meal tolerance.  4.  Diabetes mellitus type 1 with chronic kidney disease.  Insulin-dependent on Humalog and Lantus.  Last hemoglobin A1c 9.4 on 04/19/2022.  Glucose slightly elevated during this admission.    Glucose elevated at times, SSI managed by primary team    LOS: 3   6/7/202312:23 PM

## 2022-04-22 NOTE — Progress Notes (Signed)
Inpatient Diabetes Program Recommendations  AACE/ADA: New Consensus Statement on Inpatient Glycemic Control   Target Ranges:  Prepandial:   less than 140 mg/dL      Peak postprandial:   less than 180 mg/dL (1-2 hours)      Critically ill patients:  140 - 180 mg/dL    Latest Reference Range & Units 04/22/22 05:37  Glucose 70 - 99 mg/dL 182 (H)    Latest Reference Range & Units 04/21/22 08:05 04/21/22 11:24 04/21/22 16:18 04/21/22 21:51  Glucose-Capillary 70 - 99 mg/dL 279 (H) 198 (H) 202 (H) 150 (H)   Review of Glycemic Control  Diabetes history: DM1 (does NOT make insulin; requires basal, correction, and carbohydrate coverage insulin) Outpatient Diabetes medications: Lantus 14 units QHS, Humalog TID for meal coverage and correction (1 unit per 8 grams of carbs, 1 unit for every 50 mg/dl above 150 mg/dl) Current orders for Inpatient glycemic control: Semglee 14 units daily, Novolog 0-9 units TID with meals, Novolog 0-5 units QHS   Inpatient Diabetes Program Recommendations:     Insulin: Fasting lab glucose 182 mg/dl today. Please consider increasing Semglee to 16 units daily. Once diet is advanced and patient eating well, please consider ordering Novolog 4 units TID with meals for meal coverage if patient eats at least 50% of meals  Thanks, Barnie Alderman, RN, MSN, [place] [facility] Coordinator Inpatient Diabetes Program [phone] (Team Pager from 8am to [place])

## 2022-04-23 DIAGNOSIS — N179 Acute kidney failure, unspecified: Secondary | ICD-10-CM | POA: Diagnosis not present

## 2022-04-23 DIAGNOSIS — R112 Nausea with vomiting, unspecified: Secondary | ICD-10-CM | POA: Diagnosis not present

## 2022-04-23 DIAGNOSIS — N184 Chronic kidney disease, stage 4 (severe): Secondary | ICD-10-CM | POA: Diagnosis not present

## 2022-04-23 LAB — GLUCOSE, CAPILLARY
Glucose-Capillary: 59 mg/dL — ABNORMAL LOW (ref 70–99)
Glucose-Capillary: 76 mg/dL (ref 70–99)
Glucose-Capillary: 127 mg/dL — ABNORMAL HIGH (ref 70–99)
Glucose-Capillary: 99 mg/dL (ref 70–99)
Glucose-Capillary: 99 mg/dL (ref 70–99)

## 2022-04-23 LAB — CBC
HCT: 24.2 % — ABNORMAL LOW (ref 36.0–46.0)
Hemoglobin: 7.9 g/dL — ABNORMAL LOW (ref 12.0–15.0)
MCH: 28.2 pg (ref 26.0–34.0)
MCHC: 32.6 g/dL (ref 30.0–36.0)
MCV: 86.4 fL (ref 80.0–100.0)
Platelets: 331 10*3/uL (ref 150–400)
RBC: 2.8 MIL/uL — ABNORMAL LOW (ref 3.87–5.11)
RDW: 12 % (ref 11.5–15.5)
WBC: 8.5 10*3/uL (ref 4.0–10.5)
nRBC: 0 % (ref 0.0–0.2)

## 2022-04-23 LAB — RENAL FUNCTION PANEL
Albumin: 2 g/dL — ABNORMAL LOW (ref 3.5–5.0)
Anion gap: 7 (ref 5–15)
BUN: 66 mg/dL — ABNORMAL HIGH (ref 6–20)
CO2: 16 mmol/L — ABNORMAL LOW (ref 22–32)
Calcium: 7.5 mg/dL — ABNORMAL LOW (ref 8.9–10.3)
Chloride: 113 mmol/L — ABNORMAL HIGH (ref 98–111)
Creatinine, Ser: 7.48 mg/dL — ABNORMAL HIGH (ref 0.44–1.00)
GFR, Estimated: 7 mL/min — ABNORMAL LOW (ref >60)
Glucose, Bld: 64 mg/dL — ABNORMAL LOW (ref 70–99)
Phosphorus: 6.2 mg/dL — ABNORMAL HIGH (ref 2.5–4.6)
Potassium: 3.1 mmol/L — ABNORMAL LOW (ref 3.5–5.1)
Sodium: 136 mmol/L (ref 135–145)

## 2022-04-23 LAB — HEPATITIS B SURFACE ANTIGEN: Hepatitis B Surface Ag: NONREACTIVE

## 2022-04-23 MED ORDER — POTASSIUM CHLORIDE 10 MEQ/100ML IV SOLN
10.0000 meq | Freq: Once | INTRAVENOUS | Status: AC
Start: 2022-04-24 — End: 2022-04-24
  Administered 2022-04-23: 10 meq via INTRAVENOUS
  Filled 2022-04-23: qty 100

## 2022-04-23 MED ORDER — POTASSIUM CHLORIDE 10 MEQ/100ML IV SOLN
10.0000 meq | INTRAVENOUS | Status: AC
  Administered 2022-04-23 (×2): 10 meq via INTRAVENOUS
  Filled 2022-04-23 (×2): qty 100

## 2022-04-23 MED ORDER — ONDANSETRON HCL 4 MG/2ML IJ SOLN
4.0000 mg | Freq: Once | INTRAMUSCULAR | Status: AC
  Administered 2022-04-23: 4 mg via INTRAVENOUS
  Filled 2022-04-23: qty 2

## 2022-04-23 MED ORDER — POTASSIUM CHLORIDE CRYS ER 20 MEQ PO TBCR
40.0000 meq | EXTENDED_RELEASE_TABLET | Freq: Once | ORAL | Status: DC
  Filled 2022-04-23: qty 2

## 2022-04-23 MED ORDER — CHLORHEXIDINE GLUCONATE CLOTH 2 % EX PADS
6.0000 | MEDICATED_PAD | Freq: Every day | CUTANEOUS | Status: DC
  Administered 2022-04-24 – 2022-04-27 (×4): 6 via TOPICAL

## 2022-04-23 MED ORDER — INSULIN GLARGINE-YFGN 100 UNIT/ML ~~LOC~~ SOLN
10.0000 [IU] | Freq: Every day | SUBCUTANEOUS | Status: DC
Start: 2022-04-23 — End: 2022-04-26
  Administered 2022-04-23 – 2022-04-25 (×3): 10 [IU] via SUBCUTANEOUS
  Filled 2022-04-23 (×4): qty 0.1

## 2022-04-23 NOTE — Progress Notes (Signed)
Inpatient Diabetes Program Recommendations  AACE/ADA: New Consensus Statement on Inpatient Glycemic Control   Target Ranges:  Prepandial:   less than 140 mg/dL      Peak postprandial:   less than 180 mg/dL (1-2 hours)      Critically ill patients:  140 - 180 mg/dL    Latest Reference Range & Units 04/23/22 07:34  Glucose-Capillary 70 - 99 mg/dL 59 (L)    Latest Reference Range & Units 04/22/22 08:03 04/22/22 11:38 04/22/22 16:29 04/22/22 20:48  Glucose-Capillary 70 - 99 mg/dL 172 (H) 119 (H) 85 89   Review of Glycemic Control  Diabetes history: DM1 (does NOT make insulin; requires basal, correction, and carbohydrate coverage insulin) Outpatient Diabetes medications: Lantus 14 units QHS, Humalog TID for meal coverage and correction (1 unit per 8 grams of carbs, 1 unit for every 50 mg/dl above 150 mg/dl) Current orders for Inpatient glycemic control: Semglee 14 units daily, Novolog 0-9 units TID with meals, Novolog 0-5 units QHS   Inpatient Diabetes Program Recommendations:     Insulin: Fasting glucose 59 mg/dl today. Please consider decreasing Semglee to 12 units daily.  Oral intake: Per chart, patient still has poor PO intake and noted Zofran given this morning at 7:44 am for nausea. May need to consider adding dextrose to IV fluids if patient continues to have poor PO intake.  Thanks, Barnie Alderman, RN, MSN, [place] [facility] Coordinator Inpatient Diabetes Program [phone] (Team Pager from 8am to [place])

## 2022-04-23 NOTE — Progress Notes (Signed)
[facility]  ROUNDING NOTE   Subjective:   Patient seen sitting at side of bed, nurse at bedside Patient complains of nausea this morning. Reports ability to tolerate fluids and solid food yesterday.  Creatinine 7.48  Objective:  Vital signs in last 24 hours:  Temp:  [97.8 F (36.6 C)-98.8 F (37.1 C)] 98.8 F (37.1 C) (06/08 0808) Pulse Rate:  [87-100] 100 (06/08 0808) Resp:  [17-18] 18 (06/08 0808) BP: (130-152)/(78-98) 152/98 (06/08 0808) SpO2:  [98 %-100 %] 100 % (06/08 0808) Weight:  [67.7 kg] 67.7 kg (06/08 0500)  Weight change: -0.793 kg Filed Weights   04/21/22 0435 04/22/22 0500 04/23/22 0500  Weight: 68.1 kg 68.5 kg 67.7 kg    Intake/Output: I/O last 3 completed shifts: In: 2791.3 [P.O.:480; I.V.:2161.2; IV Piggyback:150.1] Out: 400 [Urine:400]   Intake/Output this shift:  No intake/output data recorded.  Physical Exam: General: NAD, ill-appearing  Head: Normocephalic, atraumatic. Moist oral mucosal membranes  Eyes: Anicteric  Lungs:  Clear to auscultation, normal effort  Heart: Regular rate and rhythm  Abdomen:  Soft, nontender  Extremities:  No peripheral edema.  Neurologic: Nonfocal, moving all four extremities  Skin: No lesions  Access: Rt AVF (maturing)    Basic Metabolic Panel: Recent Labs  Lab 04/19/22 1705 04/19/22 1813 04/20/22 0733 04/21/22 0954 04/22/22 0537 04/23/22 0543  NA 141  --  139 136 135 136  K 3.9  --  4.3 3.3* 3.4* 3.1*  CL 112*  --  109 109 110 113*  CO2 17*  --  8* 17* 15* 16*  GLUCOSE 116*  --  387* 259* 182* 64*  BUN 59*  --  69* 75* 72* 66*  CREATININE 6.36*  --  6.91* 7.77* 7.74* 7.48*  CALCIUM 8.4*  --  7.7* 7.8* 7.7* 7.5*  MG  --  2.4  --   --  2.5*  --   PHOS  --   --   --  7.6* 6.9* 6.2*     Liver Function Tests: Recent Labs  Lab 04/19/22 1705 04/20/22 0733 04/21/22 0954 04/22/22 0537 04/23/22 0543  AST 21 13*  --   --   --   ALT 17 14  --   --   --   ALKPHOS 52 44  --   --   --    BILITOT 0.7 1.1  --   --   --   PROT 7.4 6.3*  --   --   --   ALBUMIN 2.8* 2.5* 2.3* 2.2* 2.0*    Recent Labs  Lab 04/19/22 1705  LIPASE 21    No results for input(s): "AMMONIA" in the last 168 hours.  CBC: Recent Labs  Lab 04/19/22 1705 04/20/22 0733 04/22/22 0537 04/23/22 0543  WBC 10.4 12.3* 8.8 8.5  NEUTROABS 9.2*  --   --   --   HGB 7.7* 7.3* 8.4* 7.9*  HCT 24.0* 22.8* 25.3* 24.2*  MCV 88.6 90.1 88.2 86.4  PLT 326 299 314 331     Cardiac Enzymes: No results for input(s): "CKTOTAL", "CKMB", "CKMBINDEX", "TROPONINI" in the last 168 hours.  BNP: Invalid input(s): "POCBNP"  CBG: Recent Labs  Lab 04/22/22 1629 04/22/22 2048 04/23/22 0734 04/23/22 0805 04/23/22 1143  GLUCAP 85 89 59* 76 127*     Microbiology: Results for orders placed or performed during the hospital encounter of 02/13/20  Respiratory Panel by RT PCR (Flu A&B, Covid) - Nasopharyngeal Swab     Status: None   Collection  Time: 02/13/20  2:41 PM   Specimen: Nasopharyngeal Swab  Result Value Ref Range Status   SARS Coronavirus 2 by RT PCR NEGATIVE NEGATIVE Final    Comment: (NOTE) SARS-CoV-2 target nucleic acids are NOT DETECTED. The SARS-CoV-2 RNA is generally detectable in upper respiratoy specimens during the acute phase of infection. The lowest concentration of SARS-CoV-2 viral copies this assay can detect is 131 copies/mL. A negative result does not preclude SARS-Cov-2 infection and should not be used as the sole basis for treatment or other patient management decisions. A negative result may occur with  improper specimen collection/handling, submission of specimen other than nasopharyngeal swab, presence of viral mutation(s) within the areas targeted by this assay, and inadequate number of viral copies (<131 copies/mL). A negative result must be combined with clinical observations, patient history, and epidemiological information. The expected result is Negative. Fact Sheet for  Patients:  PinkCheek.be Fact Sheet for Healthcare Providers:  GravelBags.it This test is not yet ap proved or cleared by the [place] FDA and  has been authorized for detection and/or diagnosis of SARS-CoV-2 by FDA under an Emergency Use Authorization (EUA). This EUA will remain  in effect (meaning this test can be used) for the duration of the COVID-19 declaration under Section 564(b)(1) of the Act, 21 U.S.C. section 360bbb-3(b)(1), unless the authorization is terminated or revoked sooner.    Influenza A by PCR NEGATIVE NEGATIVE Final   Influenza B by PCR NEGATIVE NEGATIVE Final    Comment: (NOTE) The Xpert Xpress SARS-CoV-2/FLU/RSV assay is intended as an aid in  the diagnosis of influenza from Nasopharyngeal swab specimens and  should not be used as a sole basis for treatment. Nasal washings and  aspirates are unacceptable for Xpert Xpress SARS-CoV-2/FLU/RSV  testing. Fact Sheet for Patients: PinkCheek.be Fact Sheet for Healthcare Providers: GravelBags.it This test is not yet approved or cleared by the [place] FDA and  has been authorized for detection and/or diagnosis of SARS-CoV-2 by  FDA under an Emergency Use Authorization (EUA). This EUA will remain  in effect (meaning this test can be used) for the duration of the  Covid-19 declaration under Section 564(b)(1) of the Act, 21  U.S.C. section 360bbb-3(b)(1), unless the authorization is  terminated or revoked. Performed at [facility], [address] [place], [place] [address]     Coagulation Studies: No results for input(s): "LABPROT", "INR" in the last 72 hours.  Urinalysis: No results for input(s): "COLORURINE", "LABSPEC", "PHURINE", "GLUCOSEU", "HGBUR", "BILIRUBINUR", "KETONESUR", "PROTEINUR", "UROBILINOGEN", "NITRITE", "LEUKOCYTESUR" in the last 72 hours.  Invalid input(s):  "APPERANCEUR"     Imaging: DG Chest 1 View  Result Date: 04/22/2022 CLINICAL DATA:  Reason for exam: Kidney damage EXAM: CHEST  1 VIEW COMPARISON:  08/07/2018 FINDINGS: Low lung volumes with crowding of bronchovascular structures. No focal infiltrate or overt edema. Heart size and mediastinal contours are within normal limits. No effusion. Visualized bones unremarkable. IMPRESSION: Low lung volumes.  No acute findings. Electronically Signed   By: Lucrezia Europe M.D.   On: 04/22/2022 17:34     Medications:    sodium chloride 50 mL/hr at 04/23/22 0628   promethazine (PHENERGAN) injection (IM or IVPB) 12.5 mg (04/23/22 8588)    amLODipine  10 mg Oral Daily   feeding supplement (NEPRO CARB STEADY)  237 mL Oral TID WC   ferrous gluconate  324 mg Oral Q breakfast   heparin  5,000 Units Subcutaneous Q8H   insulin aspart  0-5 Units Subcutaneous QHS  insulin aspart  0-9 Units Subcutaneous TID WC   insulin glargine-yfgn  10 Units Subcutaneous Daily   montelukast  10 mg Oral QHS   multivitamin with minerals  1 tablet Oral Daily   pantoprazole  40 mg Oral BID   sodium bicarbonate  1,300 mg Oral BID   Vitamin D (Ergocalciferol)  50,000 Units Oral Weekly   acetaminophen **OR** acetaminophen, calcium carbonate (dosed in mg elemental calcium), camphor-menthol **AND** hydrOXYzine, docusate sodium, labetalol, ondansetron **OR** ondansetron (ZOFRAN) IV, promethazine (PHENERGAN) injection (IM or IVPB), sorbitol, zolpidem  Assessment/ Plan:  [name] is a 26 y.o.  female with type 1 diabetes, chronic kidney disease V, and hypertenison, who was admitted to [facility] on 04/19/2022 for Gastroenteritis [K52.9] Nausea and vomiting, unspecified vomiting type [R11.2]   Acute kidney injury with hypokalemia on chronic kidney disease stage V.  Baseline creatinine appears to be 5.0 with GFR 11 on 03/13/2022.  AV fistula placed on 03/19/2022, followed by [facility] physicians.  Acute kidney injury likely secondary to  hypovolemia and poor oral intake.  Patient reports nausea and vomiting after alcohol consumption on Saturday.  CT abdomen pelvis negative for renal obstruction.  No acute indication for dialysis at this time.   Renal function essentially the same, 7% GFR.  No urine output recorded.  Patient continues to complain of nausea and requesting additional antiemetics.  Discussed with patient and she is agreeable to proceed with renal replacement therapy.  Will discuss with vascular lab about placing a permacath.  We will initiate dialysis after that placement.  First dialysis treatment tentatively Friday.  Renal navigator aware of dialysis initiation and will proceed with outpatient clinic placement.  Lab Results  Component Value Date   CREATININE 7.48 (H) 04/23/2022   CREATININE 7.74 (H) 04/22/2022   CREATININE 7.77 (H) 04/21/2022    Intake/Output Summary (Last 24 hours) at 04/23/2022 1238 Last data filed at 04/23/2022 4174 Gross per 24 hour  Intake 2791.29 ml  Output 200 ml  Net 2591.29 ml    2. Anemia of chronic kidney disease Lab Results  Component Value Date   HGB 7.9 (L) 04/23/2022    Hemoglobin remains below desired target.  Will consider EPO with dialysis initiation.  Due to transplant candidacy, would avoid blood transfusions, if possible  3. Secondary Hyperparathyroidism:  Lab Results  Component Value Date   CALCIUM 7.5 (L) 04/23/2022   PHOS 6.2 (H) 04/23/2022  We will continue to monitor bone minerals during this admission.  These will slowly correct with dialysis initiation.  Prescribed calcium carbonate as needed, will consider scheduled dosing with meal tolerance.  4.  Diabetes mellitus type 1 with chronic kidney disease.  Insulin-dependent on Humalog and Lantus.  Last hemoglobin A1c 9.4 on 04/19/2022.  Glucose slightly elevated during this admission.    Glucose stable. SSI managed by primary team    LOS: [place] 6/8/202312:38 PM

## 2022-04-23 NOTE — Progress Notes (Addendum)
Progress Note    Emma Thomas  ZOX:[id number] DOB: [birth year]  DOA: 04/19/2022 PCP: [facility]      Brief Narrative:    Medical records reviewed and are as summarized below:  Emma Thomas is a 26 y.o. female with medical history significant for type I DM with frequent DKA's, hypertension, diabetic neuropathy, CKD stage IV who, who presented to the hospital with intractable nausea and vomiting.         Assessment/Plan:   Principal Problem:   Nausea and vomiting Active Problems:   AKI (acute kidney injury) (HCC)   Depression, major, recurrent, moderate (HCC)   Diabetes mellitus (HCC)   Hypertension   CKD (chronic kidney disease) stage 4, GFR 15-29 ml/min (HCC)   Body mass index is 24.09 kg/m.  Intractable nausea and vomiting,?  Ileus: Continue antiemetics and IV fluids.  Encourage adequate oral intake.  Nepro has been scheduled with meals.  AKI on CKD stage IV, secondary hyperparathyroidism: No significant improvement in creatinine.  Urine output is low.  Plan to initiate hemodialysis tomorrow following permacath placement.  Monitor BMP.  Follow-up with nephrologist for further recommendations.  Hypokalemia: Replete potassium and monitor levels  Type I DM with hyperglycemia, hypoglycemia: Glucose dropped to 59 this morning and this was treated with juice.  Decrease Semglee from 14 to 10 units daily.  NovoLog as needed for hyperglycemia.  Other comorbidities include hypertension, depression, anemia of chronic disease    Diet Order             Diet clear liquid Room service appropriate? Yes; Fluid consistency: Thin  Diet effective now                            Consultants: Nephrologist  Procedures: None    Medications:    amLODipine  10 mg Oral Daily   [START ON 04/24/2022] Chlorhexidine Gluconate Cloth  6 each Topical Q0600   feeding supplement (NEPRO CARB STEADY)  237 mL Oral TID WC   ferrous gluconate  324  mg Oral Q breakfast   heparin  5,000 Units Subcutaneous Q8H   insulin aspart  0-5 Units Subcutaneous QHS   insulin aspart  0-9 Units Subcutaneous TID WC   insulin glargine-yfgn  10 Units Subcutaneous Daily   montelukast  10 mg Oral QHS   multivitamin with minerals  1 tablet Oral Daily   pantoprazole  40 mg Oral BID   sodium bicarbonate  1,300 mg Oral BID   Vitamin D (Ergocalciferol)  50,000 Units Oral Weekly   Continuous Infusions:  sodium chloride 50 mL/hr at 04/23/22 8119   promethazine (PHENERGAN) injection (IM or IVPB) 12.5 mg (04/23/22 1429)     Anti-infectives (From admission, onward)    None              Family Communication/Anticipated D/C date and plan/Code Status   DVT prophylaxis: heparin injection 5,000 Units Start: 04/19/22 2230     Code Status: Full Code  Family Communication: None Disposition Plan: Plan to discharge home in 2 to 3 days   Status is: Inpatient Remains inpatient appropriate because: Nausea, worsening kidney function       Subjective:   C/o nausea and poor oral intake.  Dr. Holley Raring (nephrologist) and his assistant Benancio Deeds, NP were at the bedside.  Objective:    Vitals:   04/22/22 2339 04/23/22 0500 04/23/22 0514 04/23/22 0808  BP: (!) 148/80  (!) 148/94 Marland Kitchen)  152/98  Pulse: 94  87 100  Resp: 18  17 18   Temp: 97.8 F (36.6 C)  98.5 F (36.9 C) 98.8 F (37.1 C)  TempSrc: Oral  Oral   SpO2: 100%  98% 100%  Weight:  67.7 kg    Height:       No data found.   Intake/Output Summary (Last 24 hours) at 04/23/2022 1443 Last data filed at 04/23/2022 6301 Gross per 24 hour  Intake 2791.29 ml  Output 200 ml  Net 2591.29 ml   Filed Weights   04/21/22 0435 04/22/22 0500 04/23/22 0500  Weight: 68.1 kg 68.5 kg 67.7 kg    Exam:  GEN: NAD SKIN: No rash EYES: EOMI ENT: MMM CV: RRR PULM: CTA B ABD: soft, ND, NT, +BS CNS: AAO x 3, non focal EXT: No edema or tenderness        Data Reviewed:   I have personally  reviewed following labs and imaging studies:  Labs: Labs show the following:   Basic Metabolic Panel: Recent Labs  Lab 04/19/22 1705 04/19/22 1813 04/20/22 0733 04/21/22 0954 04/22/22 0537 04/23/22 0543  NA 141  --  139 136 135 136  K 3.9  --  4.3 3.3* 3.4* 3.1*  CL 112*  --  109 109 110 113*  CO2 17*  --  8* 17* 15* 16*  GLUCOSE 116*  --  387* 259* 182* 64*  BUN 59*  --  69* 75* 72* 66*  CREATININE 6.36*  --  6.91* 7.77* 7.74* 7.48*  CALCIUM 8.4*  --  7.7* 7.8* 7.7* 7.5*  MG  --  2.4  --   --  2.5*  --   PHOS  --   --   --  7.6* 6.9* 6.2*   GFR Estimated Creatinine Clearance: 10.6 mL/min (A) (by C-G formula based on SCr of 7.48 mg/dL (H)). Liver Function Tests: Recent Labs  Lab 04/19/22 1705 04/20/22 0733 04/21/22 0954 04/22/22 0537 04/23/22 0543  AST 21 13*  --   --   --   ALT 17 14  --   --   --   ALKPHOS 52 44  --   --   --   BILITOT 0.7 1.1  --   --   --   PROT 7.4 6.3*  --   --   --   ALBUMIN 2.8* 2.5* 2.3* 2.2* 2.0*   Recent Labs  Lab 04/19/22 1705  LIPASE 21   No results for input(s): "AMMONIA" in the last 168 hours. Coagulation profile No results for input(s): "INR", "PROTIME" in the last 168 hours.  CBC: Recent Labs  Lab 04/19/22 1705 04/20/22 0733 04/22/22 0537 04/23/22 0543  WBC 10.4 12.3* 8.8 8.5  NEUTROABS 9.2*  --   --   --   HGB 7.7* 7.3* 8.4* 7.9*  HCT 24.0* 22.8* 25.3* 24.2*  MCV 88.6 90.1 88.2 86.4  PLT 326 299 314 331   Cardiac Enzymes: No results for input(s): "CKTOTAL", "CKMB", "CKMBINDEX", "TROPONINI" in the last 168 hours. BNP (last 3 results) No results for input(s): "PROBNP" in the last 8760 hours. CBG: Recent Labs  Lab 04/22/22 1629 04/22/22 2048 04/23/22 0734 04/23/22 0805 04/23/22 1143  GLUCAP 85 89 59* 76 127*   D-Dimer: No results for input(s): "DDIMER" in the last 72 hours. Hgb A1c: No results for input(s): "HGBA1C" in the last 72 hours.  Lipid Profile: No results for input(s): "CHOL", "HDL",  "LDLCALC", "TRIG", "CHOLHDL", "LDLDIRECT" in the last 72 hours.  Thyroid function studies: No results for input(s): "TSH", "T4TOTAL", "T3FREE", "THYROIDAB" in the last 72 hours.  Invalid input(s): "FREET3" Anemia work up: No results for input(s): "VITAMINB12", "FOLATE", "FERRITIN", "TIBC", "IRON", "RETICCTPCT" in the last 72 hours. Sepsis Labs: Recent Labs  Lab 04/19/22 1705 04/20/22 0733 04/22/22 0537 04/23/22 0543  WBC 10.4 12.3* 8.8 8.5    Microbiology No results found for this or any previous visit (from the past 240 hour(s)).  Procedures and diagnostic studies:  DG Chest 1 View  Result Date: 04/22/2022 CLINICAL DATA:  Reason for exam: Kidney damage EXAM: CHEST  1 VIEW COMPARISON:  08/07/2018 FINDINGS: Low lung volumes with crowding of bronchovascular structures. No focal infiltrate or overt edema. Heart size and mediastinal contours are within normal limits. No effusion. Visualized bones unremarkable. IMPRESSION: Low lung volumes.  No acute findings. Electronically Signed   By: Lucrezia Europe M.D.   On: 04/22/2022 17:34               LOS: 4 days   Brelynn Wheller  [facility]   Pager on [email or web address]. If 7PM-7AM, please contact night-coverage at [email or web address]     04/23/2022, 2:43 PM

## 2022-04-23 NOTE — Plan of Care (Signed)
  Problem: Elimination: Goal: Will not experience complications related to bowel motility Outcome: Progressing Goal: Will not experience complications related to urinary retention Outcome: Progressing   Problem: Skin Integrity: Goal: Risk for impaired skin integrity will decrease Outcome: Progressing   Problem: Pain Managment: Goal: General experience of comfort will improve Outcome: Progressing

## 2022-04-23 NOTE — Progress Notes (Signed)
Patient given 4oz of juice for a blood sugar of 59. On coming shift notified and will recheck blood sugar in 63minutes. Patient reports feeling fine.

## 2022-04-24 ENCOUNTER — Inpatient Hospital Stay
Admission: EM | Disposition: A | Discharge status: Home / Self Care | Payer: Self-pay | Source: Home / Self Care | Attending: Internal Medicine

## 2022-04-24 DIAGNOSIS — Z992 Dependence on renal dialysis: Secondary | ICD-10-CM

## 2022-04-24 DIAGNOSIS — N186 End stage renal disease: Secondary | ICD-10-CM

## 2022-04-24 HISTORY — PX: DIALYSIS/PERMA CATHETER INSERTION: CATH118288

## 2022-04-24 LAB — RENAL FUNCTION PANEL
Albumin: 2.1 g/dL — ABNORMAL LOW (ref 3.5–5.0)
Anion gap: 12 (ref 5–15)
BUN: 60 mg/dL — ABNORMAL HIGH (ref 6–20)
CO2: 10 mmol/L — ABNORMAL LOW (ref 22–32)
Calcium: 7.7 mg/dL — ABNORMAL LOW (ref 8.9–10.3)
Chloride: 113 mmol/L — ABNORMAL HIGH (ref 98–111)
Creatinine, Ser: 7.31 mg/dL — ABNORMAL HIGH (ref 0.44–1.00)
GFR, Estimated: 7 mL/min — ABNORMAL LOW (ref >60)
Glucose, Bld: 165 mg/dL — ABNORMAL HIGH (ref 70–99)
Phosphorus: 6.1 mg/dL — ABNORMAL HIGH (ref 2.5–4.6)
Potassium: 3.5 mmol/L (ref 3.5–5.1)
Sodium: 135 mmol/L (ref 135–145)

## 2022-04-24 LAB — CBC
HCT: 23.3 % — ABNORMAL LOW (ref 36.0–46.0)
Hemoglobin: 7.3 g/dL — ABNORMAL LOW (ref 12.0–15.0)
MCH: 28 pg (ref 26.0–34.0)
MCHC: 31.3 g/dL (ref 30.0–36.0)
MCV: 89.3 fL (ref 80.0–100.0)
Platelets: 323 10*3/uL (ref 150–400)
RBC: 2.61 MIL/uL — ABNORMAL LOW (ref 3.87–5.11)
RDW: 12.2 % (ref 11.5–15.5)
WBC: 8.8 10*3/uL (ref 4.0–10.5)
nRBC: 0 % (ref 0.0–0.2)

## 2022-04-24 LAB — GLUCOSE, CAPILLARY
Glucose-Capillary: 177 mg/dL — ABNORMAL HIGH (ref 70–99)
Glucose-Capillary: 128 mg/dL — ABNORMAL HIGH (ref 70–99)
Glucose-Capillary: 93 mg/dL (ref 70–99)
Glucose-Capillary: 123 mg/dL — ABNORMAL HIGH (ref 70–99)

## 2022-04-24 LAB — MAGNESIUM: Magnesium: 2.2 mg/dL (ref 1.7–2.4)

## 2022-04-24 SURGERY — DIALYSIS/PERMA CATHETER INSERTION
Anesthesia: Moderate Sedation

## 2022-04-24 MED ORDER — ALTEPLASE 2 MG IJ SOLR: 2.0000 mg | Freq: Once | INTRAMUSCULAR | Status: DC | PRN

## 2022-04-24 MED ORDER — ONDANSETRON HCL 4 MG/2ML IJ SOLN
INTRAMUSCULAR | Status: AC
  Administered 2022-04-24: 4 mg
  Filled 2022-04-24: qty 2

## 2022-04-24 MED ORDER — DIPHENHYDRAMINE HCL 50 MG/ML IJ SOLN
50.0000 mg | Freq: Once | INTRAMUSCULAR | Status: DC | PRN
Start: 2022-04-24 — End: 2022-04-24

## 2022-04-24 MED ORDER — MIDAZOLAM HCL 2 MG/2ML IJ SOLN
INTRAMUSCULAR | Status: DC | PRN
  Administered 2022-04-24: .5 mg via INTRAVENOUS
  Administered 2022-04-24: 2 mg via INTRAVENOUS

## 2022-04-24 MED ORDER — MIDAZOLAM HCL 2 MG/ML PO SYRP: 8.0000 mg | ORAL_SOLUTION | Freq: Once | ORAL | Status: DC | PRN

## 2022-04-24 MED ORDER — LIDOCAINE-PRILOCAINE 2.5-2.5 % EX CREA: 1.0000 "application " | TOPICAL_CREAM | CUTANEOUS | Status: DC | PRN

## 2022-04-24 MED ORDER — HYDROMORPHONE HCL 1 MG/ML IJ SOLN
1.0000 mg | Freq: Once | INTRAMUSCULAR | Status: AC | PRN
  Administered 2022-04-24: 1 mg via INTRAVENOUS
  Filled 2022-04-24: qty 1

## 2022-04-24 MED ORDER — PENTAFLUOROPROP-TETRAFLUOROETH EX AERO: 1.0000 "application " | INHALATION_SPRAY | CUTANEOUS | Status: DC | PRN

## 2022-04-24 MED ORDER — SODIUM CHLORIDE 0.9 % IV SOLN: INTRAVENOUS | Status: DC

## 2022-04-24 MED ORDER — ONDANSETRON HCL 4 MG/2ML IJ SOLN
4.0000 mg | Freq: Four times a day (QID) | INTRAMUSCULAR | Status: DC | PRN
Start: 2022-04-24 — End: 2022-04-24

## 2022-04-24 MED ORDER — FENTANYL CITRATE (PF) 100 MCG/2ML IJ SOLN
INTRAMUSCULAR | Status: DC | PRN
  Administered 2022-04-24: 12.5 ug via INTRAVENOUS
  Administered 2022-04-24: 50 ug via INTRAVENOUS

## 2022-04-24 MED ORDER — MIDAZOLAM HCL 2 MG/2ML IJ SOLN
INTRAMUSCULAR | Status: AC
  Filled 2022-04-24: qty 4

## 2022-04-24 MED ORDER — HEPARIN SODIUM (PORCINE) 1000 UNIT/ML DIALYSIS: 1000.0000 [IU] | INTRAMUSCULAR | Status: DC | PRN

## 2022-04-24 MED ORDER — HEPARIN SODIUM (PORCINE) 1000 UNIT/ML IJ SOLN
INTRAMUSCULAR | Status: AC
  Administered 2022-04-24: 4200 [IU] via INTRAVENOUS_CENTRAL
  Filled 2022-04-24: qty 10

## 2022-04-24 MED ORDER — CEFAZOLIN SODIUM-DEXTROSE 1-4 GM/50ML-% IV SOLN
INTRAVENOUS | Status: AC
  Administered 2022-04-24: 1 g via INTRAVENOUS
  Filled 2022-04-24: qty 50

## 2022-04-24 MED ORDER — FENTANYL CITRATE PF 50 MCG/ML IJ SOSY
PREFILLED_SYRINGE | INTRAMUSCULAR | Status: AC
  Filled 2022-04-24: qty 2

## 2022-04-24 MED ORDER — LIDOCAINE HCL (PF) 1 % IJ SOLN: 5.0000 mL | INTRAMUSCULAR | Status: DC | PRN

## 2022-04-24 MED ORDER — EPOETIN ALFA 10000 UNIT/ML IJ SOLN
4000.0000 [IU] | Freq: Once | INTRAMUSCULAR | Status: AC
  Administered 2022-04-24: 4000 [IU] via INTRAVENOUS

## 2022-04-24 MED ORDER — METHYLPREDNISOLONE SODIUM SUCC 125 MG IJ SOLR: 125.0000 mg | Freq: Once | INTRAMUSCULAR | Status: DC | PRN

## 2022-04-24 MED ORDER — EPOETIN ALFA 4000 UNIT/ML IJ SOLN
INTRAMUSCULAR | Status: AC
  Filled 2022-04-24: qty 1

## 2022-04-24 MED ORDER — RENA-VITE PO TABS
1.0000 | ORAL_TABLET | Freq: Every day | ORAL | Status: DC
  Administered 2022-04-24 – 2022-04-26 (×3): 1 via ORAL
  Filled 2022-04-24 (×3): qty 1

## 2022-04-24 MED ORDER — FAMOTIDINE 20 MG PO TABS: 40.0000 mg | ORAL_TABLET | Freq: Once | ORAL | Status: DC | PRN

## 2022-04-24 MED ORDER — CEFAZOLIN SODIUM-DEXTROSE 1-4 GM/50ML-% IV SOLN
1.0000 g | INTRAVENOUS | Status: AC
  Filled 2022-04-24: qty 50

## 2022-04-24 SURGICAL SUPPLY — 12 items
BIOPATCH RED 1 DISK 7.0 (GAUZE/BANDAGES/DRESSINGS) ×1 IMPLANT
CATH CANNON HEMO 15FR 19 (HEMODIALYSIS SUPPLIES) ×1 IMPLANT
COVER PROBE U/S 5X48 (MISCELLANEOUS) ×1 IMPLANT
DERMABOND ADVANCED (GAUZE/BANDAGES/DRESSINGS) ×1
DERMABOND ADVANCED .7 DNX12 (GAUZE/BANDAGES/DRESSINGS) IMPLANT
DRAPE INCISE IOBAN 66X45 STRL (DRAPES) ×1 IMPLANT
NDL ENTRY 21GA 7CM ECHOTIP (NEEDLE) IMPLANT
NEEDLE ENTRY 21GA 7CM ECHOTIP (NEEDLE) ×2 IMPLANT
PACK ANGIOGRAPHY (CUSTOM PROCEDURE TRAY) ×1 IMPLANT
SET INTRO CAPELLA COAXIAL (SET/KITS/TRAYS/PACK) ×1 IMPLANT
SUT MNCRL AB 4-0 PS2 18 (SUTURE) ×2 IMPLANT
SUT SILK 0 FSL (SUTURE) ×1 IMPLANT

## 2022-04-24 NOTE — Interval H&P Note (Signed)
History and Physical Interval Note:  04/24/2022 3:26 PM  Emma Thomas  has presented today for surgery, with the diagnosis of ESRD.  The various methods of treatment have been discussed with the patient and family. After consideration of risks, benefits and other options for treatment, the patient has consented to  Procedure(s): DIALYSIS/PERMA CATHETER INSERTION (N/A) as a surgical intervention.  The patient's history has been reviewed, patient examined, no change in status, stable for surgery.  I have reviewed the patient's chart and labs.  Questions were answered to the patient's satisfaction.     Hortencia Pilar

## 2022-04-24 NOTE — Progress Notes (Signed)
Hemodialysis Post Treatment Note:  Tx date:04/24/2022 Tx time: 2 hours Access: right CVC UF Removed: no fluid removal  Note: HD completed. Tolerated well. No HD complications.

## 2022-04-24 NOTE — Progress Notes (Signed)
[facility]  ROUNDING NOTE   Subjective:   Patient seen sitting up in bed Continues to complain of nausea  Only attempted to drink a Boost/Ensure yesterday Currently NPO for procedure    Objective:  Vital signs in last 24 hours:  Temp:  [98.4 F (36.9 C)-99.3 F (37.4 C)] 98.4 F (36.9 C) (06/09 0801) Pulse Rate:  [96-104] 99 (06/09 0801) Resp:  [18-20] 20 (06/09 0801) BP: (155-172)/(88-108) 155/98 (06/09 0801) SpO2:  [100 %] 100 % (06/09 0801) Weight:  [67.6 kg] 67.6 kg (06/09 0600)  Weight change: -0.069 kg Filed Weights   04/22/22 0500 04/23/22 0500 04/24/22 0600  Weight: 68.5 kg 67.7 kg 67.6 kg    Intake/Output: I/O last 3 completed shifts: In: 2553.5 [P.O.:480; I.V.:1666.5; IV Piggyback:407] Out: -    Intake/Output this shift:  No intake/output data recorded.  Physical Exam: General: NAD  Head: Normocephalic, atraumatic. Moist oral mucosal membranes  Eyes: Anicteric  Lungs:  Clear to auscultation, normal effort  Heart: Regular rate and rhythm  Abdomen:  Soft, nontender  Extremities:  No peripheral edema.  Neurologic: Nonfocal, moving all four extremities  Skin: No lesions  Access: Rt AVF (maturing)    Basic Metabolic Panel: Recent Labs  Lab 04/19/22 1813 04/20/22 0733 04/21/22 0954 04/22/22 0537 04/23/22 0543 04/24/22 0553  NA  --  139 136 135 136 135  K  --  4.3 3.3* 3.4* 3.1* 3.5  CL  --  109 109 110 113* 113*  CO2  --  8* 17* 15* 16* 10*  GLUCOSE  --  387* 259* 182* 64* 165*  BUN  --  69* 75* 72* 66* 60*  CREATININE  --  6.91* 7.77* 7.74* 7.48* 7.31*  CALCIUM  --  7.7* 7.8* 7.7* 7.5* 7.7*  MG 2.4  --   --  2.5*  --  2.2  PHOS  --   --  7.6* 6.9* 6.2* 6.1*     Liver Function Tests: Recent Labs  Lab 04/19/22 1705 04/20/22 0733 04/21/22 0954 04/22/22 0537 04/23/22 0543 04/24/22 0553  AST 21 13*  --   --   --   --   ALT 17 14  --   --   --   --   ALKPHOS 52 44  --   --   --   --   BILITOT 0.7 1.1  --   --   --   --    PROT 7.4 6.3*  --   --   --   --   ALBUMIN 2.8* 2.5* 2.3* 2.2* 2.0* 2.1*    Recent Labs  Lab 04/19/22 1705  LIPASE 21    No results for input(s): "AMMONIA" in the last 168 hours.  CBC: Recent Labs  Lab 04/19/22 1705 04/20/22 0733 04/22/22 0537 04/23/22 0543 04/24/22 0553  WBC 10.4 12.3* 8.8 8.5 8.8  NEUTROABS 9.2*  --   --   --   --   HGB 7.7* 7.3* 8.4* 7.9* 7.3*  HCT 24.0* 22.8* 25.3* 24.2* 23.3*  MCV 88.6 90.1 88.2 86.4 89.3  PLT 326 299 314 331 323     Cardiac Enzymes: No results for input(s): "CKTOTAL", "CKMB", "CKMBINDEX", "TROPONINI" in the last 168 hours.  BNP: Invalid input(s): "POCBNP"  CBG: Recent Labs  Lab 04/23/22 0805 04/23/22 1143 04/23/22 1634 04/23/22 2126 04/24/22 0740  GLUCAP 76 127* 99 99 177*     Microbiology: Results for orders placed or performed during the hospital encounter of 02/13/20  Respiratory  Panel by RT PCR (Flu A&B, Covid) - Nasopharyngeal Swab     Status: None   Collection Time: 02/13/20  2:41 PM   Specimen: Nasopharyngeal Swab  Result Value Ref Range Status   SARS Coronavirus 2 by RT PCR NEGATIVE NEGATIVE Final    Comment: (NOTE) SARS-CoV-2 target nucleic acids are NOT DETECTED. The SARS-CoV-2 RNA is generally detectable in upper respiratoy specimens during the acute phase of infection. The lowest concentration of SARS-CoV-2 viral copies this assay can detect is 131 copies/mL. A negative result does not preclude SARS-Cov-2 infection and should not be used as the sole basis for treatment or other patient management decisions. A negative result may occur with  improper specimen collection/handling, submission of specimen other than nasopharyngeal swab, presence of viral mutation(s) within the areas targeted by this assay, and inadequate number of viral copies (<131 copies/mL). A negative result must be combined with clinical observations, patient history, and epidemiological information. The expected result is  Negative. Fact Sheet for Patients:  PinkCheek.be Fact Sheet for Healthcare Providers:  GravelBags.it This test is not yet ap proved or cleared by the [place] FDA and  has been authorized for detection and/or diagnosis of SARS-CoV-2 by FDA under an Emergency Use Authorization (EUA). This EUA will remain  in effect (meaning this test can be used) for the duration of the COVID-19 declaration under Section 564(b)(1) of the Act, 21 U.S.C. section 360bbb-3(b)(1), unless the authorization is terminated or revoked sooner.    Influenza A by PCR NEGATIVE NEGATIVE Final   Influenza B by PCR NEGATIVE NEGATIVE Final    Comment: (NOTE) The Xpert Xpress SARS-CoV-2/FLU/RSV assay is intended as an aid in  the diagnosis of influenza from Nasopharyngeal swab specimens and  should not be used as a sole basis for treatment. Nasal washings and  aspirates are unacceptable for Xpert Xpress SARS-CoV-2/FLU/RSV  testing. Fact Sheet for Patients: PinkCheek.be Fact Sheet for Healthcare Providers: GravelBags.it This test is not yet approved or cleared by the [place] FDA and  has been authorized for detection and/or diagnosis of SARS-CoV-2 by  FDA under an Emergency Use Authorization (EUA). This EUA will remain  in effect (meaning this test can be used) for the duration of the  Covid-19 declaration under Section 564(b)(1) of the Act, 21  U.S.C. section 360bbb-3(b)(1), unless the authorization is  terminated or revoked. Performed at [facility], [address] [place], [place] [address]     Coagulation Studies: No results for input(s): "LABPROT", "INR" in the last 72 hours.  Urinalysis: No results for input(s): "COLORURINE", "LABSPEC", "PHURINE", "GLUCOSEU", "HGBUR", "BILIRUBINUR", "KETONESUR", "PROTEINUR", "UROBILINOGEN", "NITRITE", "LEUKOCYTESUR" in the last 72  hours.  Invalid input(s): "APPERANCEUR"     Imaging: DG Chest 1 View  Result Date: 04/22/2022 CLINICAL DATA:  Reason for exam: Kidney damage EXAM: CHEST  1 VIEW COMPARISON:  08/07/2018 FINDINGS: Low lung volumes with crowding of bronchovascular structures. No focal infiltrate or overt edema. Heart size and mediastinal contours are within normal limits. No effusion. Visualized bones unremarkable. IMPRESSION: Low lung volumes.  No acute findings. Electronically Signed   By: Lucrezia Europe M.D.   On: 04/22/2022 17:34     Medications:    sodium chloride 50 mL/hr at 04/24/22 2409   sodium chloride      ceFAZolin (ANCEF) IV     promethazine (PHENERGAN) injection (IM or IVPB) 12.5 mg (04/24/22 1131)    amLODipine  10 mg Oral Daily   Chlorhexidine Gluconate Cloth  6 each Topical  Q0600   feeding supplement (NEPRO CARB STEADY)  237 mL Oral TID WC   ferrous gluconate  324 mg Oral Q breakfast   heparin  5,000 Units Subcutaneous Q8H   insulin aspart  0-5 Units Subcutaneous QHS   insulin aspart  0-9 Units Subcutaneous TID WC   insulin glargine-yfgn  10 Units Subcutaneous Daily   montelukast  10 mg Oral QHS   multivitamin with minerals  1 tablet Oral Daily   pantoprazole  40 mg Oral BID   sodium bicarbonate  1,300 mg Oral BID   Vitamin D (Ergocalciferol)  50,000 Units Oral Weekly   acetaminophen **OR** acetaminophen, calcium carbonate (dosed in mg elemental calcium), camphor-menthol **AND** hydrOXYzine, diphenhydrAMINE, docusate sodium, famotidine, labetalol, methylPREDNISolone (SOLU-MEDROL) injection, midazolam, ondansetron **OR** ondansetron (ZOFRAN) IV, promethazine (PHENERGAN) injection (IM or IVPB), sorbitol, zolpidem  Assessment/ Plan:  Ms. JURNEE NAKAYAMA is a 26 y.o.  female with type 1 diabetes, chronic kidney disease V, and hypertenison, who was admitted to [facility] on 04/19/2022 for Gastroenteritis [K52.9] Nausea and vomiting, unspecified vomiting type [R11.2]   End stage renal disease now  requiring hemodialysis.    Due to lack of renal recovery and deteriorating presentation, we feel patient has progressed to end stage renal disease.   Discussed this with patient and she is agreeable to proceed. Vascular to proceed with permcath placement today. Dialysis will begin after placement. Will receive short treatment today and slightly longer treatment tomorrow. Also discussed home dialysis options and she is interested in PD. Will initiate hemodialysis and follow up with home modalities outpatient.   Renal navigator is aware of patient and will begin outpatient placement.   Lab Results  Component Value Date   CREATININE 7.31 (H) 04/24/2022   CREATININE 7.48 (H) 04/23/2022   CREATININE 7.74 (H) 04/22/2022    Intake/Output Summary (Last 24 hours) at 04/24/2022 1152 Last data filed at 04/24/2022 4982 Gross per 24 hour  Intake 1436.65 ml  Output --  Net 1436.65 ml    2. Anemia of chronic kidney disease Lab Results  Component Value Date   HGB 7.3 (L) 04/24/2022    Hgb below target, will initiate low dose EPO 4000 units IV with treatments.   Due to transplant candidacy, would avoid blood transfusions, if possible  3. Secondary Hyperparathyroidism:  Lab Results  Component Value Date   CALCIUM 7.7 (L) 04/24/2022   PHOS 6.1 (H) 04/24/2022  Will continue to monitor bone minerals. These should slowly correct with dialysis.   4.  Diabetes mellitus type 1 with chronic kidney disease.  Insulin-dependent on Humalog and Lantus.  Last hemoglobin A1c 9.4 on 04/19/2022.  Glucose slightly elevated during this admission.    Glucose stable. SSI managed by primary team    LOS: 5 [place] 6/9/202311:52 AM

## 2022-04-24 NOTE — Op Note (Signed)
[facility]   OPERATIVE NOTE     PROCEDURE: 1. Insertion of a right IJ tunneled dialysis catheter. 2. Catheter placement and cannulation under ultrasound and fluoroscopic guidance  PRE-OPERATIVE DIAGNOSIS: end-stage renal requiring hemodialysis  POST-OPERATIVE DIAGNOSIS: same as above  SURGEON: Hortencia Pilar  ANESTHESIA: Conscious sedation was administered under my direct supervision by the interventional radiology RN. IV Versed plus fentanyl were utilized. Continuous ECG, pulse oximetry and blood pressure was monitored throughout the entire procedure.  Conscious sedation was for a total of 22 minutes and 56 seconds.  ESTIMATED BLOOD LOSS: Minimal  FINDING(S): 1.  Tips of the catheter in the right atrium on fluoroscopy 2.  No obvious pneumothorax on fluoroscopy  SPECIMEN(S):  none  INDICATIONS:   Emma Thomas is a 26 y.o. female  presents with end stage renal disease.  Therefore, the patient requires a tunneled dialysis catheter placement.  The patient is informed of  the risks catheter placement include but are not limited to: bleeding, infection, central venous injury, pneumothorax, possible venous stenosis, possible malpositioning in the venous system, and possible infections related to long-term catheter presence.  The patient was aware of these risks and agreed to proceed.  DESCRIPTION: The patient was taken back to Special Procedure suite.  Prior to sedation, the patient was given IV antibiotics.  After obtaining adequate sedation, the patient was prepped and draped in the standard fashion for a right IJ tunneled dialysis catheter placement.  Appropriate Time Out is called.     The the right neck and chest wall are then infiltrated with 1% Lidocaine with epinepherine.  A 19 cm tip to cuff tunneled catheter is then selected, opened on the back table and prepped. Ultrasound is placed in a sterile sleeve.  Under ultrasound guidance, the right IJ vein  is examined and is noted to be echolucent and easily compressible indicating patency.   An image is recorded for the permanent record.  The right IJ vein is cannulated with the microneedle under direct ultrasound vissualization.  A Microwire followed by a micro sheath is then inserted without difficulty.   A J-wire was then advanced under fluoroscopic guidance into the inferior vena cava and the wire was secured.  Small counter incision was then made at the wire insertion site. A small pocket was fashioned with blunt dissection to allow easier passage of the cuff.  The dilator and peel-away sheath are then advanced over the wire under fluoroscopic guidance. The catheters and advanced through the peel-away sheath. It is approximated to the right chest wall after verifying the tips at the atrial caval junction and an exit site is selected.  Small incision is made at the selected exit site and the tunneling device was passed subcutaneously to the counter incision. Catheter is then pulled through the subcutaneous tunnel. The catheter is then verified for tip position under fluoroscopy, transected and the hub assembly connected.    Each port was tested by aspirating and flushing.  No resistance was noted.  Each port was then thoroughly flushed with heparinized saline.  The catheter was secured in placed with two interrupted stitches of 0 silk tied to the catheter.  The counter incision was closed with a U-stitch of 4-0 Monocryl.  The insertion site is then cleaned and sterile bandages applied including a Biopatch.  Each port was then packed with concentrated heparin (1000 Units/mL) at the manufacturer recommended volumes to each port.  Sterile caps were applied to each port.  On completion  fluoroscopy, the tips of the catheter were in the right atrium, and there was no evidence of pneumothorax.  COMPLICATIONS: None  CONDITION: Unchanged   Hortencia Pilar [facility] Office:  [phone]   04/24/2022, 4:15 PM

## 2022-04-24 NOTE — Progress Notes (Signed)
Pt reported onset of loose stool with one urgency incontinent episode that started at midnight. Night covering provider made aware with no new intervention or order received. Nausea without any emesis managed with PRN Zofran and Phenergan.   Pt NPO since midnight. CHG bath done this morning. Dialysis and [facility] cath placement consents signed by patient are in the chart.   BP elevated overnight with SBP in 170s-160s and DBP90s-101. PRN labetalol given per parameter with mild improvement in BP.

## 2022-04-24 NOTE — Progress Notes (Signed)
Initial Nutrition Assessment  DOCUMENTATION CODES:   Not applicable  INTERVENTION:   Nepro Shake po TID, each supplement provides 425 kcal and 19 grams protein  Rena-vite po daily   Pt at high refeed risk; recommend monitor potassium, magnesium and phosphorus labs daily until stable  If patient continues to have poor oral intake over the next 48hrs, recommend fluoroscopy guided post pyloric nasogastric tube placement and nutrition support  If tube placed, recommend:  Osmolite 1.5_0 /hr + ProSource TF BID via tube  Free water flushes 26m q4 hours to maintain tube patency   Regimen provides 2200kcal/day, 101g/day protein and 12759mday of free water   NUTRITION DIAGNOSIS:   Inadequate oral intake related to acute illness as evidenced by other (comment) (pt on NPO/clear diet since admission).  GOAL:   Patient will meet greater than or equal to 90% of their needs  MONITOR:   PO intake, Supplement acceptance, Diet advancement, Labs, Weight trends, Skin, I & O's  REASON FOR ASSESSMENT:   NPO/Clear Liquid Diet    ASSESSMENT:   [age].o. female with medical history significant of type 1 diabetes with frequent DKA's, essential hypertension, diabetic neuropathy, chronic kidney disease stage V now end-stage and major depression who is admitted with intractable nausea and vomiting.  Met with pt in room today. Pt reports that she is feeling fair today. Pt reports good appetite and oral intake at baseline but reports poor oral intake for several days pta r/t nausea and vomiting. Pt reports that she continues to have nausea and poor oral intake in hospital. Pt has been refusing to eat meals. Pt has not yet started drinking any supplements but reports that she is willing to try them. Plan today is for permcath and new HD. Hopefully, pt's appetite will increase after HD. RD discussed with pt the importance of adequate intake needed to preserve lean muscle and replace losses from HD. RD  will add supplements and rena-vit to help pt meet her estimated needs. Pt is at high refeed risk. If patient's oral intake does not improve over the next 1-2 days, recommend fluoroscopy guided post-pyloric nasogastric tube placement and nutrition support. Per chart, pt appears weight stable pta. Pt is up ~7lbs since admission but appears to be back at her UBW.   Medications reviewed and include: epogen, ferrous gluconate, heparin, insulin, protonix, Na bicarbonate, vitamin D  Labs reviewed: K 3.5 wnl, BUN 60(H), creat 7.31(H), P 6.1(H), Mg 2.2 wnl Hgb 7.3(L), Hct 23.3(L) Cbgs- 128, 177 x 24 hrs AIC 9.4(H)- 6/4  NUTRITION - FOCUSED PHYSICAL EXAM:  Flowsheet Row Most Recent Value  Orbital Region No depletion  Upper Arm Region No depletion  Thoracic and Lumbar Region No depletion  Buccal Region No depletion  Temple Region No depletion  Clavicle Bone Region No depletion  Clavicle and Acromion Bone Region No depletion  Scapular Bone Region No depletion  Dorsal Hand No depletion  Patellar Region Mild depletion  Anterior Thigh Region Mild depletion  Posterior Calf Region Mild depletion  Edema (RD Assessment) None  Hair Reviewed  Eyes Reviewed  Mouth Reviewed  Skin Reviewed  Nails Reviewed   Diet Order:   Diet Order             Diet NPO time specified  Diet effective midnight           Diet NPO time specified Except for: Sips with Meds  Diet effective midnight  EDUCATION NEEDS:   Education needs have been addressed  Skin:  Skin Assessment: Reviewed RN Assessment  Last BM:  6/9- type 7  Height:   Ht Readings from Last 1 Encounters:  04/24/22 _0  (1.676 m)    Weight:   Wt Readings from Last 1 Encounters:  04/24/22 67.6 kg    Ideal Body Weight:  59 kg  BMI:  Body mass index is 24.05 kg/m.  Estimated Nutritional Needs:   Kcal:  1900-2200kcal/day  Protein:  95-110g/day  Fluid:  1.8-2.1L/day  Koleen Distance MS, RD, LDN Please refer to  [facility] for RD and/or RD on-call/weekend/after hours pager

## 2022-04-24 NOTE — H&P (View-Only) (Signed)
[facility] Vascular Consult Note  MRN : [id number]  Emma Thomas is a 26 y.o. (May 11, 1996) female who presents with chief complaint of  Chief Complaint  Patient presents with   Nausea   Emesis  .   Consulting Physician: Colon Flattery, NP Reason for consult: PermCath Placement History of Present Illness: The patient is a 26 year old female with type 1 diabetes and chronic kidney disease stage V.  The patient has an AV fistula placed in May however it has not yet matured.  The patient was admitted to the hospital with nausea and vomiting due to gastroenteritis.  The patient was found to be in acute kidney injury which was likely secondary to hypovolemia and decreased oral intake.  The patient will need to begin dialysis due to this acute kidney injury.  Current Facility-Administered Medications  Medication Dose Route Frequency Provider Last Rate Last Admin   0.9 %  sodium chloride infusion   Intravenous Continuous Colon Flattery, NP 50 mL/hr at 04/24/22 4259 Infusion Verify at 04/24/22 5638   0.9 %  sodium chloride infusion   Intravenous Continuous Kris Hartmann, NP       acetaminophen (TYLENOL) tablet 650 mg  650 mg Oral Q6H PRN Elwyn Reach, MD       Or   acetaminophen (TYLENOL) suppository 650 mg  650 mg Rectal Q6H PRN Elwyn Reach, MD       amLODipine (NORVASC) tablet 10 mg  10 mg Oral Daily Fritzi Mandes, MD   10 mg at 04/24/22 7564   calcium carbonate (dosed in mg elemental calcium) suspension 500 mg of elemental calcium  500 mg of elemental calcium Oral Q6H PRN Elwyn Reach, MD       camphor-menthol (SARNA) lotion 1 application.  1 application  Topical P3I PRN Elwyn Reach, MD       And   hydrOXYzine (ATARAX) tablet 25 mg  25 mg Oral Q8H PRN Elwyn Reach, MD       ceFAZolin (ANCEF) IVPB 1 g/50 mL premix  1 g Intravenous 30 min Pre-Op Kris Hartmann, NP       Chlorhexidine Gluconate Cloth 2 % PADS 6 each  6 each Topical  Q0600 Colon Flattery, NP   6 each at 04/24/22 0510   diphenhydrAMINE (BENADRYL) injection 50 mg  50 mg Intravenous Once PRN Kris Hartmann, NP       docusate sodium (ENEMEEZ) enema 283 mg  1 enema Rectal PRN Elwyn Reach, MD       famotidine (PEPCID) tablet 40 mg  40 mg Oral Once PRN Kris Hartmann, NP       feeding supplement (NEPRO CARB STEADY) liquid 237 mL  237 mL Oral TID WC Jennye Boroughs, MD   237 mL at 04/23/22 1204   ferrous gluconate (FERGON) tablet 324 mg  324 mg Oral Q breakfast Fritzi Mandes, MD   324 mg at 04/24/22 0802   heparin injection 5,000 Units  5,000 Units Subcutaneous Q8H Garba, Mohammad L, MD       insulin aspart (novoLOG) injection 0-5 Units  0-5 Units Subcutaneous QHS Fritzi Mandes, MD       insulin aspart (novoLOG) injection 0-9 Units  0-9 Units Subcutaneous TID WC Fritzi Mandes, MD   2 Units at 04/24/22 0754   insulin glargine-yfgn (SEMGLEE) injection 10 Units  10 Units Subcutaneous Daily Jennye Boroughs, MD   10 Units at 04/24/22 0803   labetalol (NORMODYNE) injection 10  mg  10 mg Intravenous Q2H PRN Fritzi Mandes, MD   10 mg at 04/24/22 6962   methylPREDNISolone sodium succinate (SOLU-MEDROL) 125 mg/2 mL injection 125 mg  125 mg Intravenous Once PRN Kris Hartmann, NP       midazolam (VERSED) 2 MG/ML syrup 8 mg  8 mg Oral Once PRN Kris Hartmann, NP       montelukast (SINGULAIR) tablet 10 mg  10 mg Oral QHS Fritzi Mandes, MD       multivitamin with minerals tablet 1 tablet  1 tablet Oral Daily Fritzi Mandes, MD   1 tablet at 04/20/22 1054   ondansetron (ZOFRAN) tablet 4 mg  4 mg Oral Q6H PRN Elwyn Reach, MD       Or   ondansetron (ZOFRAN) injection 4 mg  4 mg Intravenous Q6H PRN Elwyn Reach, MD   4 mg at 04/24/22 0729   pantoprazole (PROTONIX) EC tablet 40 mg  40 mg Oral BID Jennye Boroughs, MD   40 mg at 04/24/22 9528   promethazine (PHENERGAN) 12.5 mg in sodium chloride 0.9 % 50 mL IVPB  12.5 mg Intravenous Q6H PRN Jennye Boroughs, MD 200 mL/hr at 04/24/22  1131 12.5 mg at 04/24/22 1131   sodium bicarbonate tablet 1,300 mg  1,300 mg Oral BID Fritzi Mandes, MD   1,300 mg at 04/23/22 2111   sorbitol 70 % solution 30 mL  30 mL Oral PRN Elwyn Reach, MD       Vitamin D (Ergocalciferol) (DRISDOL) capsule 50,000 Units  50,000 Units Oral Weekly Fritzi Mandes, MD   50,000 Units at 04/20/22 1054   zolpidem (AMBIEN) tablet 5 mg  5 mg Oral QHS PRN Elwyn Reach, MD   5 mg at 04/23/22 2111    Past Medical History:  Diagnosis Date   Diabetes mellitus without complication ([place])    Type 1 DM   Essential hypertension    Hypertension 03/04/2013   Neurologic disorder    Both feet   Recurrent UTI    Renal disorder     Past Surgical History:  Procedure Laterality Date   abscess removal     excision of bartholin cyst   Nexplanon  01/2011    Social History Social History   Tobacco Use   Smoking status: Never   Smokeless tobacco: Never  Vaping Use   Vaping Use: Never used  Substance Use Topics   Alcohol use: Not Currently    Alcohol/week: 2.0 standard drinks of alcohol    Types: 2 Shots of liquor per week   Drug use: No    Family History Family History  Problem Relation Age of Onset   Breast cancer Mother 109   Lung cancer Maternal Grandmother    Diabetes type II Paternal Grandmother     No Known Allergies   REVIEW OF SYSTEMS (Negative unless checked)  Constitutional: [] Weight loss  [] Fever  [] Chills Cardiac: [] Chest pain   [] Chest pressure   [] Palpitations   [] Shortness of breath when laying flat   [] Shortness of breath at rest   [] Shortness of breath with exertion. Vascular:  [] Pain in legs with walking   [] Pain in legs at rest   [] Pain in legs when laying flat   [] Claudication   [] Pain in feet when walking  [] Pain in feet at rest  [] Pain in feet when laying flat   [] History of DVT   [] Phlebitis   [] Swelling in legs   [] Varicose veins   [] Non-healing ulcers Pulmonary:   []   Uses home oxygen   [] Productive cough   [] Hemoptysis    [] Wheeze  [] COPD   [] Asthma Neurologic:  [] Dizziness  [] Blackouts   [] Seizures   [] History of stroke   [] History of TIA  [] Aphasia   [] Temporary blindness   [] Dysphagia   [] Weakness or numbness in arms   [] Weakness or numbness in legs Musculoskeletal:  [] Arthritis   [] Joint swelling   [] Joint pain   [] Low back pain Hematologic:  [] Easy bruising  [] Easy bleeding   [] Hypercoagulable state   [] Anemic  [] Hepatitis Gastrointestinal:  [] Blood in stool   [] Vomiting blood  [] Gastroesophageal reflux/heartburn   [] Difficulty swallowing. Genitourinary:  [] Chronic kidney disease   [] Difficult urination  [] Frequent urination  [] Burning with urination   [] Blood in urine Skin:  [] Rashes   [] Ulcers   [] Wounds Psychological:  [] History of anxiety   []  History of major depression.  Physical Examination  Vitals:   04/23/22 2200 04/24/22 0427 04/24/22 0600 04/24/22 0801  BP: (!) 159/100 (!) 168/98 (!) 162/99 (!) 155/98  Pulse:  (!) 104 96 99  Resp:  20  20  Temp:  99.1 F (37.3 C)  98.4 F (36.9 C)  TempSrc:  Oral  Oral  SpO2:  100%  100%  Weight:   67.6 kg   Height:       Body mass index is 24.07 kg/m. Gen:  WD/WN, NAD Head: [place]/AT, No temporalis wasting. Prominent temp pulse not noted. Ear/Nose/Throat: Hearing grossly intact, nares w/o erythema or drainage, oropharynx w/o Erythema/Exudate Eyes: Sclera non-icteric, conjunctiva clear Neck: Trachea midline.  No JVD.  Pulmonary:  Good air movement, respirations not labored, equal bilaterally.  Cardiac: RRR, normal S1, S2. Vascular: Right AV fistula.  No edema  Gastrointestinal: soft, non-tender/non-distended. No guarding/reflex.  Musculoskeletal: M/S 5/5 throughout.  Extremities without ischemic changes.  No deformity or atrophy. No edema. Neurologic: Sensation grossly intact in extremities.  Symmetrical.  Speech is fluent. Motor exam as listed above. Psychiatric: Judgment intact, Mood & affect appropriate for pt's clinical situation. Dermatologic:  No rashes or ulcers noted.  No cellulitis or open wounds. Lymph : No Cervical, Axillary, or Inguinal lymphadenopathy.    CBC Lab Results  Component Value Date   WBC 8.8 04/24/2022   HGB 7.3 (L) 04/24/2022   HCT 23.3 (L) 04/24/2022   MCV 89.3 04/24/2022   PLT 323 04/24/2022    BMET    Component Value Date/Time   NA 135 04/24/2022 0553   NA 136 10/25/2019 1022   K 3.5 04/24/2022 0553   CL 113 (H) 04/24/2022 0553   CO2 10 (L) 04/24/2022 0553   GLUCOSE 165 (H) 04/24/2022 0553   BUN 60 (H) 04/24/2022 0553   BUN 21 (H) 10/25/2019 1022   CREATININE 7.31 (H) 04/24/2022 0553   CALCIUM 7.7 (L) 04/24/2022 0553   GFRNONAA 7 (L) 04/24/2022 0553   GFRAA 55 (L) 02/13/2020 1439   Estimated Creatinine Clearance: 10.8 mL/min (A) (by C-G formula based on SCr of 7.31 mg/dL (H)).  COAG Lab Results  Component Value Date   INR 1.16 08/16/2017    Radiology DG Chest 1 View  Result Date: 04/22/2022 CLINICAL DATA:  Reason for exam: Kidney damage EXAM: CHEST  1 VIEW COMPARISON:  08/07/2018 FINDINGS: Low lung volumes with crowding of bronchovascular structures. No focal infiltrate or overt edema. Heart size and mediastinal contours are within normal limits. No effusion. Visualized bones unremarkable. IMPRESSION: Low lung volumes.  No acute findings. Electronically Signed   By: Eden Emms.D.  On: 04/22/2022 17:34   CT ABDOMEN PELVIS WO CONTRAST  Result Date: 04/19/2022 CLINICAL DATA:  Abdominal pain, nausea, vomiting, diarrhea EXAM: CT ABDOMEN AND PELVIS WITHOUT CONTRAST TECHNIQUE: Multidetector CT imaging of the abdomen and pelvis was performed following the standard protocol without IV contrast. RADIATION DOSE REDUCTION: This exam was performed according to the departmental dose-optimization program which includes automated exposure control, adjustment of the mA and/or kV according to patient size and/or use of iterative reconstruction technique. COMPARISON:  None Available. FINDINGS: Lower chest:  Unremarkable. Hepatobiliary: No focal abnormality is seen in the liver. There is no dilation of bile ducts. Gallbladder is slightly distended. There is no wall thickening. There is increased density in the dependent portion of gallbladder lumen. Pancreas: There is atrophy.  No focal abnormality is seen. Spleen: Unremarkable. Adrenals/Urinary Tract: Adrenals are unremarkable. There is no hydronephrosis. 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 There are no renal or ureteral stones. Urinary bladder is unremarkable. Stomach/Bowel: Small hiatal hernia is seen. There is wall thickening in the lower thoracic esophagus possibly suggesting reflux esophagitis. There is mild diffuse wall thickening in the small bowel loops. There is minimal stranding adjacent to the small bowel loops. Appendix is unremarkable. There is mild diffuse wall thickening in the colon. This may be due to incomplete distention or suggest nonspecific inflammation. Vascular/Lymphatic: Unremarkable. Reproductive: Unremarkable. Other: There is minimal ascites. There is no pneumoperitoneum. Small umbilical hernia containing fat is seen. Musculoskeletal: Unremarkable. IMPRESSION: There is no evidence of intestinal obstruction or pneumoperitoneum. There is no hydronephrosis. Appendix is not dilated. There is mild diffuse wall thickening in the small bowel loops and colon. This may be due to incomplete distention or suggest nonspecific enterocolitis. There is minimal stranding in the fat planes adjacent to small bowel loops. There is minimal ascites. Subtle increased density is seen in the dependent portion of gallbladder lumen suggesting presence of sludge and possibly tiny stones. There are no signs of acute cholecystitis. There is no dilation of bile ducts. Electronically Signed   By: Elmer Picker M.D.   On: 04/19/2022 19:20      Assessment/Plan 1.  End-stage renal disease  The patient had a fistula placed in early May by  [facility] and is still not mature as of yet.  Patient needs to begin dialysis.  We will plan for PermCath placement today.  Family Communication:   Kris Hartmann, NP [facility] [phone] (Office Phone) [phone] (Office Fax)  04/24/2022 11:57 AM    This note was created with Dragon medical transcription system.  Any error is purely unintentional

## 2022-04-24 NOTE — Consult Note (Signed)
[facility] Consult Note  MRN : [id number]  Emma Thomas is a 26 y.o. (1996/06/11) female who presents with chief complaint of  Chief Complaint  Patient presents with   Nausea   Emesis  .   Consulting Physician: Colon Flattery, NP Reason for consult: PermCath Placement History of Present Illness: The patient is a 26 year old female with type 1 diabetes and chronic kidney disease stage V.  The patient has an AV fistula placed in May however it has not yet matured.  The patient was admitted to the hospital with nausea and vomiting due to gastroenteritis.  The patient was found to be in acute kidney injury which was likely secondary to hypovolemia and decreased oral intake.  The patient will need to begin dialysis due to this acute kidney injury.  Current Facility-Administered Medications  Medication Dose Route Frequency Provider Last Rate Last Admin   0.9 %  sodium chloride infusion   Intravenous Continuous Colon Flattery, NP 50 mL/hr at 04/24/22 9381 Infusion Verify at 04/24/22 8299   0.9 %  sodium chloride infusion   Intravenous Continuous Kris Hartmann, NP       acetaminophen (TYLENOL) tablet 650 mg  650 mg Oral Q6H PRN Elwyn Reach, MD       Or   acetaminophen (TYLENOL) suppository 650 mg  650 mg Rectal Q6H PRN Elwyn Reach, MD       amLODipine (NORVASC) tablet 10 mg  10 mg Oral Daily Fritzi Mandes, MD   10 mg at 04/24/22 3716   calcium carbonate (dosed in mg elemental calcium) suspension 500 mg of elemental calcium  500 mg of elemental calcium Oral Q6H PRN Elwyn Reach, MD       camphor-menthol (SARNA) lotion 1 application.  1 application  Topical R6V PRN Elwyn Reach, MD       And   hydrOXYzine (ATARAX) tablet 25 mg  25 mg Oral Q8H PRN Elwyn Reach, MD       ceFAZolin (ANCEF) IVPB 1 g/50 mL premix  1 g Intravenous 30 min Pre-Op Kris Hartmann, NP       Chlorhexidine Gluconate Cloth 2 % PADS 6 each  6 each Topical  Q0600 Colon Flattery, NP   6 each at 04/24/22 0510   diphenhydrAMINE (BENADRYL) injection 50 mg  50 mg Intravenous Once PRN Kris Hartmann, NP       docusate sodium (ENEMEEZ) enema 283 mg  1 enema Rectal PRN Elwyn Reach, MD       famotidine (PEPCID) tablet 40 mg  40 mg Oral Once PRN Kris Hartmann, NP       feeding supplement (NEPRO CARB STEADY) liquid 237 mL  237 mL Oral TID WC Jennye Boroughs, MD   237 mL at 04/23/22 1204   ferrous gluconate (FERGON) tablet 324 mg  324 mg Oral Q breakfast Fritzi Mandes, MD   324 mg at 04/24/22 0802   heparin injection 5,000 Units  5,000 Units Subcutaneous Q8H Garba, Mohammad L, MD       insulin aspart (novoLOG) injection 0-5 Units  0-5 Units Subcutaneous QHS Fritzi Mandes, MD       insulin aspart (novoLOG) injection 0-9 Units  0-9 Units Subcutaneous TID WC Fritzi Mandes, MD   2 Units at 04/24/22 0754   insulin glargine-yfgn (SEMGLEE) injection 10 Units  10 Units Subcutaneous Daily Jennye Boroughs, MD   10 Units at 04/24/22 0803   labetalol (NORMODYNE) injection 10  mg  10 mg Intravenous Q2H PRN Fritzi Mandes, MD   10 mg at 04/24/22 9528   methylPREDNISolone sodium succinate (SOLU-MEDROL) 125 mg/2 mL injection 125 mg  125 mg Intravenous Once PRN Kris Hartmann, NP       midazolam (VERSED) 2 MG/ML syrup 8 mg  8 mg Oral Once PRN Kris Hartmann, NP       montelukast (SINGULAIR) tablet 10 mg  10 mg Oral QHS Fritzi Mandes, MD       multivitamin with minerals tablet 1 tablet  1 tablet Oral Daily Fritzi Mandes, MD   1 tablet at 04/20/22 1054   ondansetron (ZOFRAN) tablet 4 mg  4 mg Oral Q6H PRN Elwyn Reach, MD       Or   ondansetron (ZOFRAN) injection 4 mg  4 mg Intravenous Q6H PRN Elwyn Reach, MD   4 mg at 04/24/22 0729   pantoprazole (PROTONIX) EC tablet 40 mg  40 mg Oral BID Jennye Boroughs, MD   40 mg at 04/24/22 4132   promethazine (PHENERGAN) 12.5 mg in sodium chloride 0.9 % 50 mL IVPB  12.5 mg Intravenous Q6H PRN Jennye Boroughs, MD 200 mL/hr at 04/24/22  1131 12.5 mg at 04/24/22 1131   sodium bicarbonate tablet 1,300 mg  1,300 mg Oral BID Fritzi Mandes, MD   1,300 mg at 04/23/22 2111   sorbitol 70 % solution 30 mL  30 mL Oral PRN Elwyn Reach, MD       Vitamin D (Ergocalciferol) (DRISDOL) capsule 50,000 Units  50,000 Units Oral Weekly Fritzi Mandes, MD   50,000 Units at 04/20/22 1054   zolpidem (AMBIEN) tablet 5 mg  5 mg Oral QHS PRN Elwyn Reach, MD   5 mg at 04/23/22 2111    Past Medical History:  Diagnosis Date   Diabetes mellitus without complication ([place])    Type 1 DM   Essential hypertension    Hypertension 03/04/2013   Neurologic disorder    Both feet   Recurrent UTI    Renal disorder     Past Surgical History:  Procedure Laterality Date   abscess removal     excision of bartholin cyst   Nexplanon  01/2011    Social History Social History   Tobacco Use   Smoking status: Never   Smokeless tobacco: Never  Vaping Use   Vaping Use: Never used  Substance Use Topics   Alcohol use: Not Currently    Alcohol/week: 2.0 standard drinks of alcohol    Types: 2 Shots of liquor per week   Drug use: No    Family History Family History  Problem Relation Age of Onset   Breast cancer Mother 25   Lung cancer Maternal Grandmother    Diabetes type II Paternal Grandmother     No Known Allergies   REVIEW OF SYSTEMS (Negative unless checked)  Constitutional: [] Weight loss  [] Fever  [] Chills Cardiac: [] Chest pain   [] Chest pressure   [] Palpitations   [] Shortness of breath when laying flat   [] Shortness of breath at rest   [] Shortness of breath with exertion. Vascular:  [] Pain in legs with walking   [] Pain in legs at rest   [] Pain in legs when laying flat   [] Claudication   [] Pain in feet when walking  [] Pain in feet at rest  [] Pain in feet when laying flat   [] History of DVT   [] Phlebitis   [] Swelling in legs   [] Varicose veins   [] Non-healing ulcers Pulmonary:   []   Uses home oxygen   [] Productive cough   [] Hemoptysis    [] Wheeze  [] COPD   [] Asthma Neurologic:  [] Dizziness  [] Blackouts   [] Seizures   [] History of stroke   [] History of TIA  [] Aphasia   [] Temporary blindness   [] Dysphagia   [] Weakness or numbness in arms   [] Weakness or numbness in legs Musculoskeletal:  [] Arthritis   [] Joint swelling   [] Joint pain   [] Low back pain Hematologic:  [] Easy bruising  [] Easy bleeding   [] Hypercoagulable state   [] Anemic  [] Hepatitis Gastrointestinal:  [] Blood in stool   [] Vomiting blood  [] Gastroesophageal reflux/heartburn   [] Difficulty swallowing. Genitourinary:  [] Chronic kidney disease   [] Difficult urination  [] Frequent urination  [] Burning with urination   [] Blood in urine Skin:  [] Rashes   [] Ulcers   [] Wounds Psychological:  [] History of anxiety   []  History of major depression.  Physical Examination  Vitals:   04/23/22 2200 04/24/22 0427 04/24/22 0600 04/24/22 0801  BP: (!) 159/100 (!) 168/98 (!) 162/99 (!) 155/98  Pulse:  (!) 104 96 99  Resp:  20  20  Temp:  99.1 F (37.3 C)  98.4 F (36.9 C)  TempSrc:  Oral  Oral  SpO2:  100%  100%  Weight:   67.6 kg   Height:       Body mass index is 24.07 kg/m. Gen:  WD/WN, NAD Head: [place]/AT, No temporalis wasting. Prominent temp pulse not noted. Ear/Nose/Throat: Hearing grossly intact, nares w/o erythema or drainage, oropharynx w/o Erythema/Exudate Eyes: Sclera non-icteric, conjunctiva clear Neck: Trachea midline.  No JVD.  Pulmonary:  Good air movement, respirations not labored, equal bilaterally.  Cardiac: RRR, normal S1, S2. Vascular: Right AV fistula.  No edema  Gastrointestinal: soft, non-tender/non-distended. No guarding/reflex.  Musculoskeletal: M/S 5/5 throughout.  Extremities without ischemic changes.  No deformity or atrophy. No edema. Neurologic: Sensation grossly intact in extremities.  Symmetrical.  Speech is fluent. Motor exam as listed above. Psychiatric: Judgment intact, Mood & affect appropriate for pt's clinical situation. Dermatologic:  No rashes or ulcers noted.  No cellulitis or open wounds. Lymph : No Cervical, Axillary, or Inguinal lymphadenopathy.    CBC Lab Results  Component Value Date   WBC 8.8 04/24/2022   HGB 7.3 (L) 04/24/2022   HCT 23.3 (L) 04/24/2022   MCV 89.3 04/24/2022   PLT 323 04/24/2022    BMET    Component Value Date/Time   NA 135 04/24/2022 0553   NA 136 10/25/2019 1022   K 3.5 04/24/2022 0553   CL 113 (H) 04/24/2022 0553   CO2 10 (L) 04/24/2022 0553   GLUCOSE 165 (H) 04/24/2022 0553   BUN 60 (H) 04/24/2022 0553   BUN 21 (H) 10/25/2019 1022   CREATININE 7.31 (H) 04/24/2022 0553   CALCIUM 7.7 (L) 04/24/2022 0553   GFRNONAA 7 (L) 04/24/2022 0553   GFRAA 55 (L) 02/13/2020 1439   Estimated Creatinine Clearance: 10.8 mL/min (A) (by C-G formula based on SCr of 7.31 mg/dL (H)).  COAG Lab Results  Component Value Date   INR 1.16 08/16/2017    Radiology DG Chest 1 View  Result Date: 04/22/2022 CLINICAL DATA:  Reason for exam: Kidney damage EXAM: CHEST  1 VIEW COMPARISON:  08/07/2018 FINDINGS: Low lung volumes with crowding of bronchovascular structures. No focal infiltrate or overt edema. Heart size and mediastinal contours are within normal limits. No effusion. Visualized bones unremarkable. IMPRESSION: Low lung volumes.  No acute findings. Electronically Signed   By: Eden Emms.D.  On: 04/22/2022 17:34   CT ABDOMEN PELVIS WO CONTRAST  Result Date: 04/19/2022 CLINICAL DATA:  Abdominal pain, nausea, vomiting, diarrhea EXAM: CT ABDOMEN AND PELVIS WITHOUT CONTRAST TECHNIQUE: Multidetector CT imaging of the abdomen and pelvis was performed following the standard protocol without IV contrast. RADIATION DOSE REDUCTION: This exam was performed according to the departmental dose-optimization program which includes automated exposure control, adjustment of the mA and/or kV according to patient size and/or use of iterative reconstruction technique. COMPARISON:  None Available. FINDINGS: Lower chest:  Unremarkable. Hepatobiliary: No focal abnormality is seen in the liver. There is no dilation of bile ducts. Gallbladder is slightly distended. There is no wall thickening. There is increased density in the dependent portion of gallbladder lumen. Pancreas: There is atrophy.  No focal abnormality is seen. Spleen: Unremarkable. Adrenals/Urinary Tract: Adrenals are unremarkable. There is no hydronephrosis. 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 There are no renal or ureteral stones. Urinary bladder is unremarkable. Stomach/Bowel: Small hiatal hernia is seen. There is wall thickening in the lower thoracic esophagus possibly suggesting reflux esophagitis. There is mild diffuse wall thickening in the small bowel loops. There is minimal stranding adjacent to the small bowel loops. Appendix is unremarkable. There is mild diffuse wall thickening in the colon. This may be due to incomplete distention or suggest nonspecific inflammation. Vascular/Lymphatic: Unremarkable. Reproductive: Unremarkable. Other: There is minimal ascites. There is no pneumoperitoneum. Small umbilical hernia containing fat is seen. Musculoskeletal: Unremarkable. IMPRESSION: There is no evidence of intestinal obstruction or pneumoperitoneum. There is no hydronephrosis. Appendix is not dilated. There is mild diffuse wall thickening in the small bowel loops and colon. This may be due to incomplete distention or suggest nonspecific enterocolitis. There is minimal stranding in the fat planes adjacent to small bowel loops. There is minimal ascites. Subtle increased density is seen in the dependent portion of gallbladder lumen suggesting presence of sludge and possibly tiny stones. There are no signs of acute cholecystitis. There is no dilation of bile ducts. Electronically Signed   By: Elmer Picker M.D.   On: 04/19/2022 19:20      Assessment/Plan 1.  End-stage renal disease  The patient had a fistula placed in early May by  [facility] and is still not mature as of yet.  Patient needs to begin dialysis.  We will plan for PermCath placement today.  Family Communication:   Kris Hartmann, NP [facility] [phone] (Office Phone) [phone] (Office Fax)  04/24/2022 11:57 AM    This note was created with Dragon medical transcription system.  Any error is purely unintentional

## 2022-04-24 NOTE — TOC Initial Note (Signed)
Transition of Care [facility]) - Initial/Assessment Note    Patient Details  Name: Emma Thomas MRN: [id number] Date of Birth: 23-Aug-1996  Transition of Care [facility]) CM/SW Contact:    Candie Chroman, LCSW Phone Number: 04/24/2022, 1:31 PM  Clinical Narrative:   Readmission prevention screen complete. CSW met with patient. No supports at bedside. CSW introduced role and explained that discharge planning would be discussed. PCP is Sara Chu, MD at [facility]. She drives herself to appointments. [facility] [address]. No issues obtaining medications. No home health or DME use prior to admission. Her husband will transport her home at discharge. No further concerns. CSW encouraged patient to contact CSW as needed. CSW will continue to follow patient for support and facilitate return home once stable.               Expected Discharge Plan: Home/Self Care Barriers to Discharge: Continued Medical Work up   Patient Goals and CMS Choice        Expected Discharge Plan and Services Expected Discharge Plan: Home/Self Care     Post Acute Care Choice: NA Living arrangements for the past 2 months: Single Family Home                                      Prior Living Arrangements/Services Living arrangements for the past 2 months: Single Family Home Lives with:: Spouse Patient language and need for interpreter reviewed:: Yes Do you feel safe going back to the place where you live?: Yes      Need for Family Participation in Patient Care: Yes (Comment) Care giver support system in place?: Yes (comment)   Criminal Activity/Legal Involvement Pertinent to Current Situation/Hospitalization: No - Comment as needed  Activities of Daily Living Home Assistive Devices/Equipment: CBG Meter ADL Screening (condition at time of admission) Patient's cognitive ability adequate to safely complete daily activities?: Yes Is the patient deaf or have difficulty hearing?:  No Does the patient have difficulty seeing, even when wearing glasses/contacts?: No Does the patient have difficulty concentrating, remembering, or making decisions?: No Patient able to express need for assistance with ADLs?: Yes Does the patient have difficulty dressing or bathing?: No Independently performs ADLs?: Yes (appropriate for developmental age) Does the patient have difficulty walking or climbing stairs?: No Weakness of Legs: None Weakness of Arms/Hands: None  Permission Sought/Granted                  Emotional Assessment Appearance:: Appears stated age Attitude/Demeanor/Rapport: Engaged, Gracious Affect (typically observed): Accepting, Appropriate, Calm, Pleasant Orientation: : Oriented to Self, Oriented to Place, Oriented to  Time, Oriented to Situation Alcohol / Substance Use: Not Applicable Psych Involvement: No (comment)  Admission diagnosis:  Gastroenteritis [K52.9] Nausea and vomiting, unspecified vomiting type [R11.2] Patient Active Problem List   Diagnosis Date Noted   CKD (chronic kidney disease) stage 4, GFR 15-29 ml/min (HCC) 04/22/2022   Nausea and vomiting 04/19/2022   Labor and delivery indication for care or intervention 02/13/2020   Pyelonephritis affecting pregnancy in first trimester 11/01/2019   Supervision of high risk pregnancy, antepartum 11/01/2019   Depression 10/27/2019   Diabetes mellitus ([place]) 10/27/2019   History of chlamydia infection 10/27/2019   History of depression 10/27/2019   Vitamin D deficiency 10/27/2019   Liver function abnormality 08/15/2018   Microalbuminuric diabetic nephropathy ([place]) 08/15/2018   High anion gap metabolic  acidosis    AKI (acute kidney injury) ([place])    Abdominal pain    Pyelonephritis 07/30/2016   DKA (diabetic ketoacidoses) 09/09/2015   History of gonorrhea 08/13/2015   Intrauterine device 05/03/2015   Neuropathy 04/13/2013   Weight loss, non-intentional 04/13/2013   Depression, major,  recurrent, moderate ([place]) 03/06/2013   Hypertension 03/04/2013   PCP:  Margarita Rana, MD Pharmacy:   [facility] [phone] [address] [place] [address] Phone: [phone] Fax: [phone]  [id number] [id number]: [phone]: [phone]    Social Determinants of Health (SDOH) Interventions    Readmission Risk Interventions    04/24/2022    1:30 PM  Readmission Risk Prevention Plan  Transportation Screening Complete  PCP or Specialist Appt within 3-5 Days Complete  [place] or [place]  Social Work Consult for [place]/Counseling Complete  Palliative Care Screening Not Applicable  Medication Review (Press photographer Complete

## 2022-04-24 NOTE — Progress Notes (Addendum)
Progress Note    Emma Thomas  IHK:[id number] DOB: [birth year]  DOA: 04/19/2022 PCP: Margarita Rana, MD      Brief Narrative:    Medical records reviewed and are as summarized below:  Emma Thomas is a 26 y.o. female with medical history significant for type I DM with frequent DKA's, hypertension, diabetic neuropathy, CKD stage IV who, who presented to the hospital with intractable nausea and vomiting.         Assessment/Plan:   Principal Problem:   Nausea and vomiting Active Problems:   AKI (acute kidney injury) ([place])   Depression, major, recurrent, moderate (HCC)   Diabetes mellitus ([place])   Hypertension   ESRD (end stage renal disease) ([place])   Body mass index is 24.05 kg/m.  Intractable nausea and vomiting: Continue antiemetics and IV fluids.    AKI on CKD stage IV progressed to ESRD, metabolic acidosis, secondary hyperparathyroidism: Plan for permacath placement today followed by initiation of hemodialysis.  Follow-up with nephrologist for further recommendations.  Hypokalemia: Improved  Type I DM with hyperglycemia, recent hypoglycemia: Continue Semglee at 10 units daily and NovoLog as needed.  Other comorbidities include hypertension, depression, anemia of chronic disease    Diet Order             Diet NPO time specified  Diet effective midnight           Diet NPO time specified Except for: Sips with Meds  Diet effective midnight                            Consultants: Nephrologist, vascular surgeon  Procedures: Plan for permacath placement for dialysis    Medications:    [MAR Hold] amLODipine  10 mg Oral Daily   [MAR Hold] Chlorhexidine Gluconate Cloth  6 each Topical Q0600   [MAR Hold] epoetin (EPOGEN/PROCRIT) injection  4,000 Units Intravenous Once   [MAR Hold] feeding supplement (NEPRO CARB STEADY)  237 mL Oral TID WC   fentaNYL       [MAR Hold] ferrous gluconate  324 mg Oral Q breakfast   [MAR Hold]  heparin  5,000 Units Subcutaneous Q8H   [MAR Hold] insulin aspart  0-5 Units Subcutaneous QHS   [MAR Hold] insulin aspart  0-9 Units Subcutaneous TID WC   [MAR Hold] insulin glargine-yfgn  10 Units Subcutaneous Daily   midazolam       [MAR Hold] montelukast  10 mg Oral QHS   [MAR Hold] multivitamin  1 tablet Oral QHS   [MAR Hold] pantoprazole  40 mg Oral BID   [MAR Hold] sodium bicarbonate  1,300 mg Oral BID   [MAR Hold] Vitamin D (Ergocalciferol)  50,000 Units Oral Weekly   Continuous Infusions:  sodium chloride 50 mL/hr at 04/24/22 1501   sodium chloride     ceFAZolin      ceFAZolin (ANCEF) IV     [MAR Hold] promethazine (PHENERGAN) injection (IM or IVPB) Stopped (04/24/22 1146)     Anti-infectives (From admission, onward)    Start     Dose/Rate Route Frequency Ordered Stop   04/24/22 1452  ceFAZolin (ANCEF) 1-4 GM/50ML-% IVPB       Note to Pharmacy: Emma Thomas H: cabinet override      04/24/22 1452 04/25/22 0259   04/24/22 0048  ceFAZolin (ANCEF) IVPB 1 g/50 mL premix        1 g 100 mL/hr over 30 Minutes Intravenous 30  min pre-op 04/24/22 0049                Family Communication/Anticipated D/C date and plan/Code Status   DVT prophylaxis: Place and maintain sequential compression device Start: 04/24/22 1514 heparin injection 5,000 Units Start: 04/19/22 2230     Code Status: Full Code  Family Communication: None Disposition Plan: Plan to discharge home in 3 to 4 days   Status is: Inpatient Remains inpatient appropriate because: Nausea, worsening kidney function       Subjective:   Interval events noted.  She still complains of nausea.  No vomiting or abdominal pain.  Objective:    Vitals:   04/24/22 0427 04/24/22 0600 04/24/22 0801 04/24/22 1523  BP: (!) 168/98 (!) 162/99 (!) 155/98 (!) 151/92  Pulse: (!) 104 96 99 100  Resp: 20  20 20   Temp: 99.1 F (37.3 C)  98.4 F (36.9 C) 98.4 F (36.9 C)  TempSrc: Oral  Oral Oral  SpO2: 100%   100% 100%  Weight:  67.6 kg  67.6 kg  Height:    5\' 6"  (1.676 m)   No data found.   Intake/Output Summary (Last 24 hours) at 04/24/2022 1539 Last data filed at 04/24/2022 1501 Gross per 24 hour  Intake 1946.38 ml  Output --  Net 1946.38 ml   Filed Weights   04/23/22 0500 04/24/22 0600 04/24/22 1523  Weight: 67.7 kg 67.6 kg 67.6 kg    Exam:  GEN: NAD SKIN: No rash EYES: EOMI ENT: MMM CV: RRR PULM: CTA B ABD: soft, ND, NT, +BS CNS: AAO x 3, non focal EXT: No edema or tenderness         Data Reviewed:   I have personally reviewed following labs and imaging studies:  Labs: Labs show the following:   Basic Metabolic Panel: Recent Labs  Lab 04/19/22 1813 04/20/22 0733 04/21/22 0954 04/22/22 0537 04/23/22 0543 04/24/22 0553  NA  --  139 136 135 136 135  K  --  4.3 3.3* 3.4* 3.1* 3.5  CL  --  109 109 110 113* 113*  CO2  --  8* 17* 15* 16* 10*  GLUCOSE  --  387* 259* 182* 64* 165*  BUN  --  69* 75* 72* 66* 60*  CREATININE  --  6.91* 7.77* 7.74* 7.48* 7.31*  CALCIUM  --  7.7* 7.8* 7.7* 7.5* 7.7*  MG 2.4  --   --  2.5*  --  2.2  PHOS  --   --  7.6* 6.9* 6.2* 6.1*   GFR Estimated Creatinine Clearance: 10.8 mL/min (A) (by C-G formula based on SCr of 7.31 mg/dL (H)). Liver Function Tests: Recent Labs  Lab 04/19/22 1705 04/20/22 0733 04/21/22 0954 04/22/22 0537 04/23/22 0543 04/24/22 0553  AST 21 13*  --   --   --   --   ALT 17 14  --   --   --   --   ALKPHOS 52 44  --   --   --   --   BILITOT 0.7 1.1  --   --   --   --   PROT 7.4 6.3*  --   --   --   --   ALBUMIN 2.8* 2.5* 2.3* 2.2* 2.0* 2.1*   Recent Labs  Lab 04/19/22 1705  LIPASE 21   No results for input(s): "AMMONIA" in the last 168 hours. Coagulation profile No results for input(s): "INR", "PROTIME" in the last 168 hours.  CBC: Recent  Labs  Lab 04/19/22 1705 04/20/22 0733 04/22/22 0537 04/23/22 0543 04/24/22 0553  WBC 10.4 12.3* 8.8 8.5 8.8  NEUTROABS 9.2*  --   --   --   --    HGB 7.7* 7.3* 8.4* 7.9* 7.3*  HCT 24.0* 22.8* 25.3* 24.2* 23.3*  MCV 88.6 90.1 88.2 86.4 89.3  PLT 326 299 314 331 323   Cardiac Enzymes: No results for input(s): "CKTOTAL", "CKMB", "CKMBINDEX", "TROPONINI" in the last 168 hours. BNP (last 3 results) No results for input(s): "PROBNP" in the last 8760 hours. CBG: Recent Labs  Lab 04/23/22 1143 04/23/22 1634 04/23/22 2126 04/24/22 0740 04/24/22 1140  GLUCAP 127* 99 99 177* 128*   D-Dimer: No results for input(s): "DDIMER" in the last 72 hours. Hgb A1c: No results for input(s): "HGBA1C" in the last 72 hours.  Lipid Profile: No results for input(s): "CHOL", "HDL", "LDLCALC", "TRIG", "CHOLHDL", "LDLDIRECT" in the last 72 hours. Thyroid function studies: No results for input(s): "TSH", "T4TOTAL", "T3FREE", "THYROIDAB" in the last 72 hours.  Invalid input(s): "FREET3" Anemia work up: No results for input(s): "VITAMINB12", "FOLATE", "FERRITIN", "TIBC", "IRON", "RETICCTPCT" in the last 72 hours. Sepsis Labs: Recent Labs  Lab 04/20/22 0733 04/22/22 0537 04/23/22 0543 04/24/22 0553  WBC 12.3* 8.8 8.5 8.8    Microbiology No results found for this or any previous visit (from the past 240 hour(s)).  Procedures and diagnostic studies:  DG Chest 1 View  Result Date: 04/22/2022 CLINICAL DATA:  Reason for exam: Kidney damage EXAM: CHEST  1 VIEW COMPARISON:  08/07/2018 FINDINGS: Low lung volumes with crowding of bronchovascular structures. No focal infiltrate or overt edema. Heart size and mediastinal contours are within normal limits. No effusion. Visualized bones unremarkable. IMPRESSION: Low lung volumes.  No acute findings. Electronically Signed   By: Lucrezia Europe M.D.   On: 04/22/2022 17:34               LOS: 5 days   Bunnie Lederman  [facility]   Pager on [email or web address]. If 7PM-7AM, please contact night-coverage at [email or web address]     04/24/2022, 3:39 PM

## 2022-04-25 LAB — CBC
HCT: 23.4 % — ABNORMAL LOW (ref 36.0–46.0)
Hemoglobin: 7.7 g/dL — ABNORMAL LOW (ref 12.0–15.0)
MCH: 28.1 pg (ref 26.0–34.0)
MCHC: 32.9 g/dL (ref 30.0–36.0)
MCV: 85.4 fL (ref 80.0–100.0)
Platelets: 323 10*3/uL (ref 150–400)
RBC: 2.74 MIL/uL — ABNORMAL LOW (ref 3.87–5.11)
RDW: 12 % (ref 11.5–15.5)
WBC: 6.9 10*3/uL (ref 4.0–10.5)
nRBC: 0 % (ref 0.0–0.2)

## 2022-04-25 LAB — RENAL FUNCTION PANEL
Albumin: 2.1 g/dL — ABNORMAL LOW (ref 3.5–5.0)
Anion gap: 5 (ref 5–15)
BUN: 33 mg/dL — ABNORMAL HIGH (ref 6–20)
CO2: 22 mmol/L (ref 22–32)
Calcium: 7.7 mg/dL — ABNORMAL LOW (ref 8.9–10.3)
Chloride: 111 mmol/L (ref 98–111)
Creatinine, Ser: 5.27 mg/dL — ABNORMAL HIGH (ref 0.44–1.00)
GFR, Estimated: 11 mL/min — ABNORMAL LOW (ref >60)
Glucose, Bld: 129 mg/dL — ABNORMAL HIGH (ref 70–99)
Phosphorus: 4.1 mg/dL (ref 2.5–4.6)
Potassium: 2.9 mmol/L — ABNORMAL LOW (ref 3.5–5.1)
Sodium: 138 mmol/L (ref 135–145)

## 2022-04-25 LAB — IRON AND TIBC
Iron: 45 ug/dL (ref 28–170)
Saturation Ratios: 18 % (ref 10.4–31.8)
TIBC: 248 ug/dL — ABNORMAL LOW (ref 250–450)
UIBC: 203 ug/dL

## 2022-04-25 LAB — GLUCOSE, CAPILLARY
Glucose-Capillary: 103 mg/dL — ABNORMAL HIGH (ref 70–99)
Glucose-Capillary: 93 mg/dL (ref 70–99)
Glucose-Capillary: 99 mg/dL (ref 70–99)
Glucose-Capillary: 184 mg/dL — ABNORMAL HIGH (ref 70–99)

## 2022-04-25 LAB — FERRITIN: Ferritin: 155 ng/mL (ref 11–307)

## 2022-04-25 MED ORDER — HYDROMORPHONE HCL 1 MG/ML IJ SOLN
0.5000 mg | Freq: Once | INTRAMUSCULAR | Status: AC
  Administered 2022-04-25: 0.5 mg via INTRAVENOUS
  Filled 2022-04-25: qty 0.5

## 2022-04-25 MED ORDER — POTASSIUM CHLORIDE CRYS ER 20 MEQ PO TBCR
40.0000 meq | EXTENDED_RELEASE_TABLET | Freq: Once | ORAL | Status: AC
  Administered 2022-04-25: 40 meq via ORAL
  Filled 2022-04-25: qty 2

## 2022-04-25 MED ORDER — HEPARIN SODIUM (PORCINE) 1000 UNIT/ML IJ SOLN
INTRAMUSCULAR | Status: AC
  Administered 2022-04-25: 4200 [IU] via INTRAVENOUS_CENTRAL
  Filled 2022-04-25: qty 10

## 2022-04-25 MED ORDER — GLUCERNA SHAKE PO LIQD
237.0000 mL | Freq: Three times a day (TID) | ORAL | Status: DC
  Administered 2022-04-25 – 2022-04-27 (×6): 237 mL via ORAL

## 2022-04-25 MED ORDER — OXYCODONE HCL 5 MG PO TABS
5.0000 mg | ORAL_TABLET | Freq: Three times a day (TID) | ORAL | Status: AC | PRN
  Administered 2022-04-25 – 2022-04-27 (×5): 5 mg via ORAL
  Filled 2022-04-25 (×5): qty 1

## 2022-04-25 NOTE — Progress Notes (Addendum)
Progress Note    Emma Thomas  MCN:[id number] DOB: 06-08-1996  DOA: 04/19/2022 PCP: Margarita Rana, MD      Brief Narrative:    Medical records reviewed and are as summarized below:  Emma Thomas is a 26 y.o. female with medical history significant for type I DM with frequent DKA's, hypertension, diabetic neuropathy, CKD stage IV who, who presented to the hospital with intractable nausea and vomiting.         Assessment/Plan:   Principal Problem:   Nausea and vomiting Active Problems:   AKI (acute kidney injury) ([place])   Depression, major, recurrent, moderate (HCC)   Diabetes mellitus ([place])   Hypertension   ESRD (end stage renal disease) ([place])   Body mass index is 23.84 kg/m.  Intractable nausea and vomiting: Improving.  Continue antiemetics as needed.  AKI on CKD stage IV progressed to ESRD, metabolic acidosis, secondary hyperparathyroidism: Hemodialysis was initiated on 04/24/2022.  Plan for repeat hemodialysis today.  Follow-up with nephrologist.  Discontinue IV fluids.  Hypokalemia: Replete potassium and monitor levels.  Type I DM with hyperglycemia, recent hypoglycemia: Continue Semglee at 10 units daily and NovoLog as needed.  Other comorbidities include hypertension, depression, anemia of chronic disease    Diet Order             Diet renal/carb modified with fluid restriction Diet-HS Snack? Nothing; Fluid restriction: 1200 mL Fluid; Room service appropriate? Yes; Fluid consistency: Thin  Diet effective now                            Consultants: Nephrologist, vascular surgeon  Procedures: Right IJ permacath placed on 04/24/2022    Medications:    amLODipine  10 mg Oral Daily   Chlorhexidine Gluconate Cloth  6 each Topical Q0600   feeding supplement (GLUCERNA SHAKE)  237 mL Oral TID WC   ferrous gluconate  324 mg Oral Q breakfast   heparin  5,000 Units Subcutaneous Q8H   insulin aspart  0-5 Units Subcutaneous QHS    insulin aspart  0-9 Units Subcutaneous TID WC   insulin glargine-yfgn  10 Units Subcutaneous Daily   montelukast  10 mg Oral QHS   multivitamin  1 tablet Oral QHS   pantoprazole  40 mg Oral BID   Vitamin D (Ergocalciferol)  50,000 Units Oral Weekly   Continuous Infusions:  sodium chloride 50 mL/hr at 04/24/22 1835   promethazine (PHENERGAN) injection (IM or IVPB) Stopped (04/24/22 1146)     Anti-infectives (From admission, onward)    Start     Dose/Rate Route Frequency Ordered Stop   04/24/22 0048  ceFAZolin (ANCEF) IVPB 1 g/50 mL premix        1 g 100 mL/hr over 30 Minutes Intravenous 30 min pre-op 04/24/22 0049 04/24/22 1619              Family Communication/Anticipated D/C date and plan/Code Status   DVT prophylaxis: Place and maintain sequential compression device Start: 04/24/22 1514 heparin injection 5,000 Units Start: 04/19/22 2230     Code Status: Full Code  Family Communication: None Disposition Plan: Plan to discharge home in 2 to 3 days   Status is: Inpatient Remains inpatient appropriate because: Nausea, worsening kidney function       Subjective:   Interval events noted.  She had hemodialysis yesterday.  Her nausea has improved.  She says she does not drive and appropriately prefers to take Glucerna  shake  Objective:    Vitals:   04/25/22 1415 04/25/22 1430 04/25/22 1445 04/25/22 1500  BP: (!) 167/103 (!) 158/104 (!) 158/102 (!) 157/107  Pulse: 92 92 93 96  Resp: 15 (!) 9 17 16   Temp:      TempSrc:      SpO2:      Weight:      Height:       No data found.   Intake/Output Summary (Last 24 hours) at 04/25/2022 1515 Last data filed at 04/25/2022 0301 Gross per 24 hour  Intake 260.84 ml  Output 0 ml  Net 260.84 ml   Filed Weights   04/24/22 1523 04/24/22 1932 04/24/22 2200  Weight: 67.6 kg 67.2 kg 67 kg    Exam:  GEN: NAD SKIN: No rash EYES: EOMI ENT: MMM CV: RRR PULM: CTA B ABD: soft, ND, NT, +BS CNS: AAO x 3, non  focal EXT: No edema or tenderness         Data Reviewed:   I have personally reviewed following labs and imaging studies:  Labs: Labs show the following:   Basic Metabolic Panel: Recent Labs  Lab 04/19/22 1813 04/20/22 0733 04/21/22 0954 04/22/22 0537 04/23/22 0543 04/24/22 0553 04/25/22 0517  NA  --    < > 136 135 136 135 138  K  --    < > 3.3* 3.4* 3.1* 3.5 2.9*  CL  --    < > 109 110 113* 113* 111  CO2  --    < > 17* 15* 16* 10* 22  GLUCOSE  --    < > 259* 182* 64* 165* 129*  BUN  --    < > 75* 72* 66* 60* 33*  CREATININE  --    < > 7.77* 7.74* 7.48* 7.31* 5.27*  CALCIUM  --    < > 7.8* 7.7* 7.5* 7.7* 7.7*  MG 2.4  --   --  2.5*  --  2.2  --   PHOS  --   --  7.6* 6.9* 6.2* 6.1* 4.1   < > = values in this interval not displayed.   GFR Estimated Creatinine Clearance: 15 mL/min (A) (by C-G formula based on SCr of 5.27 mg/dL (H)). Liver Function Tests: Recent Labs  Lab 04/19/22 1705 04/20/22 0733 04/21/22 0954 04/22/22 0537 04/23/22 0543 04/24/22 0553 04/25/22 0517  AST 21 13*  --   --   --   --   --   ALT 17 14  --   --   --   --   --   ALKPHOS 52 44  --   --   --   --   --   BILITOT 0.7 1.1  --   --   --   --   --   PROT 7.4 6.3*  --   --   --   --   --   ALBUMIN 2.8* 2.5* 2.3* 2.2* 2.0* 2.1* 2.1*   Recent Labs  Lab 04/19/22 1705  LIPASE 21   No results for input(s): "AMMONIA" in the last 168 hours. Coagulation profile No results for input(s): "INR", "PROTIME" in the last 168 hours.  CBC: Recent Labs  Lab 04/19/22 1705 04/20/22 0733 04/22/22 0537 04/23/22 0543 04/24/22 0553 04/25/22 0517  WBC 10.4 12.3* 8.8 8.5 8.8 6.9  NEUTROABS 9.2*  --   --   --   --   --   HGB 7.7* 7.3* 8.4* 7.9* 7.3* 7.7*  HCT  24.0* 22.8* 25.3* 24.2* 23.3* 23.4*  MCV 88.6 90.1 88.2 86.4 89.3 85.4  PLT 326 299 314 331 323 323   Cardiac Enzymes: No results for input(s): "CKTOTAL", "CKMB", "CKMBINDEX", "TROPONINI" in the last 168 hours. BNP (last 3 results) No  results for input(s): "PROBNP" in the last 8760 hours. CBG: Recent Labs  Lab 04/24/22 1140 04/24/22 1914 04/24/22 2251 04/25/22 0757 04/25/22 1144  GLUCAP 128* 93 123* 103* 93   D-Dimer: No results for input(s): "DDIMER" in the last 72 hours. Hgb A1c: No results for input(s): "HGBA1C" in the last 72 hours.  Lipid Profile: No results for input(s): "CHOL", "HDL", "LDLCALC", "TRIG", "CHOLHDL", "LDLDIRECT" in the last 72 hours. Thyroid function studies: No results for input(s): "TSH", "T4TOTAL", "T3FREE", "THYROIDAB" in the last 72 hours.  Invalid input(s): "FREET3" Anemia work up: No results for input(s): "VITAMINB12", "FOLATE", "FERRITIN", "TIBC", "IRON", "RETICCTPCT" in the last 72 hours. Sepsis Labs: Recent Labs  Lab 04/22/22 0537 04/23/22 0543 04/24/22 0553 04/25/22 0517  WBC 8.8 8.5 8.8 6.9    Microbiology No results found for this or any previous visit (from the past 240 hour(s)).  Procedures and diagnostic studies:  PERIPHERAL VASCULAR CATHETERIZATION  Result Date: 04/24/2022 See surgical note for result.              LOS: 6 days   Leiliana Foody  [facility]   Pager on [email or web address]. If 7PM-7AM, please contact night-coverage at [email or web address]     04/25/2022, 3:15 PM

## 2022-04-25 NOTE — Progress Notes (Signed)
Hemodialysis Post Treatment Note:  Tx date:04/25/2022 Tx time: 2 hours and 30 minutes Access: right CVC UF Removed: No fluid removal as ordered  Note: HD treatment completed. Tolerated well. No HD complications. Patient asymptomatic.

## 2022-04-25 NOTE — Progress Notes (Signed)
[facility]  Dialysis Note   Subjective:   Seen and examined on hemodialysis treatment.  He had stable dialysis for 2 hours last night. Potassium was found to be low and was supplemented this morning. Patient is still on sodium bicarbonate.   HEMODIALYSIS FLOWSHEET:  Blood Flow Rate (mL/min): 200 mL/min Arterial Pressure (mmHg): -70 mmHg Venous Pressure (mmHg): 70 mmHg Transmembrane Pressure (mmHg): 30 mmHg Ultrafiltration Rate (mL/min): 250 mL/min Dialysate Flow Rate (mL/min): 300 ml/min Conductivity: 13.9 Conductivity: 13.9 Dialysis Fluid Bolus: Normal Saline Bolus Amount (mL): 250 mL    Objective:  Vital signs in last 24 hours:  Temp:  [98.1 F (36.7 C)-98.8 F (37.1 C)] 98.8 F (37.1 C) (06/10 0810) Pulse Rate:  [90-108] 97 (06/10 1401) Resp:  [6-25] 16 (06/10 1401) BP: (137-164)/(77-105) 161/102 (06/10 1401) SpO2:  [98 %-100 %] 100 % (06/10 0810) Weight:  [67 kg-67.6 kg] 67 kg (06/09 2200)  Weight change: -0.031 kg Filed Weights   04/24/22 1523 04/24/22 1932 04/24/22 2200  Weight: 67.6 kg 67.2 kg 67 kg    Intake/Output: I/O last 3 completed shifts: In: 2207.2 [I.V.:1748.9; IV Piggyback:458.3] Out: 0    Intake/Output this shift:  No intake/output data recorded.  Physical Exam: General: NAD,   Head: Normocephalic, atraumatic. Moist oral mucosal membranes  Eyes: Anicteric, PERRL  Neck: Supple, trachea midline  Lungs:  Clear to auscultation  Heart: Regular rate and rhythm  Abdomen:  Soft, nontender,   Extremities:  peripheral edema.  Neurologic: Nonfocal, moving all four extremities  Skin: No lesions  Access:     Basic Metabolic Panel: Recent Labs  Lab 04/19/22 1813 04/20/22 0733 04/21/22 0954 04/22/22 0537 04/23/22 0543 04/24/22 0553 04/25/22 0517  NA  --    < > 136 135 136 135 138  K  --    < > 3.3* 3.4* 3.1* 3.5 2.9*  CL  --    < > 109 110 113* 113* 111  CO2  --    < > 17* 15* 16* 10* 22  GLUCOSE  --    < > 259* 182* 64*  165* 129*  BUN  --    < > 75* 72* 66* 60* 33*  CREATININE  --    < > 7.77* 7.74* 7.48* 7.31* 5.27*  CALCIUM  --    < > 7.8* 7.7* 7.5* 7.7* 7.7*  MG 2.4  --   --  2.5*  --  2.2  --   PHOS  --   --  7.6* 6.9* 6.2* 6.1* 4.1   < > = values in this interval not displayed.    Liver Function Tests: Recent Labs  Lab 04/19/22 1705 04/20/22 0733 04/21/22 0954 04/22/22 0537 04/23/22 0543 04/24/22 0553 04/25/22 0517  AST 21 13*  --   --   --   --   --   ALT 17 14  --   --   --   --   --   ALKPHOS 52 44  --   --   --   --   --   BILITOT 0.7 1.1  --   --   --   --   --   PROT 7.4 6.3*  --   --   --   --   --   ALBUMIN 2.8* 2.5* 2.3* 2.2* 2.0* 2.1* 2.1*   Recent Labs  Lab 04/19/22 1705  LIPASE 21   No results for input(s): "AMMONIA" in the last 168 hours.  CBC: Recent  Labs  Lab 04/19/22 1705 04/20/22 0733 04/22/22 0537 04/23/22 0543 04/24/22 0553 04/25/22 0517  WBC 10.4 12.3* 8.8 8.5 8.8 6.9  NEUTROABS 9.2*  --   --   --   --   --   HGB 7.7* 7.3* 8.4* 7.9* 7.3* 7.7*  HCT 24.0* 22.8* 25.3* 24.2* 23.3* 23.4*  MCV 88.6 90.1 88.2 86.4 89.3 85.4  PLT 326 299 314 331 323 323    Cardiac Enzymes: No results for input(s): "CKTOTAL", "CKMB", "CKMBINDEX", "TROPONINI" in the last 168 hours.  BNP: Invalid input(s): "POCBNP"  CBG: Recent Labs  Lab 04/24/22 1140 04/24/22 1914 04/24/22 2251 04/25/22 0757 04/25/22 1144  GLUCAP 128* 93 123* 103* 74    Microbiology: Results for orders placed or performed during the hospital encounter of 02/13/20  Respiratory Panel by RT PCR (Flu A&B, Covid) - Nasopharyngeal Swab     Status: None   Collection Time: 02/13/20  2:41 PM   Specimen: Nasopharyngeal Swab  Result Value Ref Range Status   SARS Coronavirus 2 by RT PCR NEGATIVE NEGATIVE Final    Comment: (NOTE) SARS-CoV-2 target nucleic acids are NOT DETECTED. The SARS-CoV-2 RNA is generally detectable in upper respiratoy specimens during the acute phase of infection. The  lowest concentration of SARS-CoV-2 viral copies this assay can detect is 131 copies/mL. A negative result does not preclude SARS-Cov-2 infection and should not be used as the sole basis for treatment or other patient management decisions. A negative result may occur with  improper specimen collection/handling, submission of specimen other than nasopharyngeal swab, presence of viral mutation(s) within the areas targeted by this assay, and inadequate number of viral copies (<131 copies/mL). A negative result must be combined with clinical observations, patient history, and epidemiological information. The expected result is Negative. Fact Sheet for Patients:  PinkCheek.be Fact Sheet for Healthcare Providers:  GravelBags.it This test is not yet ap proved or cleared by the [place] FDA and  has been authorized for detection and/or diagnosis of SARS-CoV-2 by FDA under an Emergency Use Authorization (EUA). This EUA will remain  in effect (meaning this test can be used) for the duration of the COVID-19 declaration under Section 564(b)(1) of the Act, 21 U.S.C. section 360bbb-3(b)(1), unless the authorization is terminated or revoked sooner.    Influenza A by PCR NEGATIVE NEGATIVE Final   Influenza B by PCR NEGATIVE NEGATIVE Final    Comment: (NOTE) The Xpert Xpress SARS-CoV-2/FLU/RSV assay is intended as an aid in  the diagnosis of influenza from Nasopharyngeal swab specimens and  should not be used as a sole basis for treatment. Nasal washings and  aspirates are unacceptable for Xpert Xpress SARS-CoV-2/FLU/RSV  testing. Fact Sheet for Patients: PinkCheek.be Fact Sheet for Healthcare Providers: GravelBags.it This test is not yet approved or cleared by the [place] FDA and  has been authorized for detection and/or diagnosis of SARS-CoV-2 by  FDA under an Emergency  Use Authorization (EUA). This EUA will remain  in effect (meaning this test can be used) for the duration of the  Covid-19 declaration under Section 564(b)(1) of the Act, 21  U.S.C. section 360bbb-3(b)(1), unless the authorization is  terminated or revoked. Performed at [facility], [address] [place], [place] [address]     Coagulation Studies: No results for input(s): "LABPROT", "INR" in the last 72 hours.  Urinalysis: No results for input(s): "COLORURINE", "LABSPEC", "PHURINE", "GLUCOSEU", "HGBUR", "BILIRUBINUR", "KETONESUR", "PROTEINUR", "UROBILINOGEN", "NITRITE", "LEUKOCYTESUR" in the last 72 hours.  Invalid input(s): "APPERANCEUR"  Imaging: PERIPHERAL VASCULAR CATHETERIZATION  Result Date: 04/24/2022 See surgical note for result.    Medications:    sodium chloride 50 mL/hr at 04/24/22 1835   promethazine (PHENERGAN) injection (IM or IVPB) Stopped (04/24/22 1146)    amLODipine  10 mg Oral Daily   Chlorhexidine Gluconate Cloth  6 each Topical Q0600   feeding supplement (GLUCERNA SHAKE)  237 mL Oral TID WC   ferrous gluconate  324 mg Oral Q breakfast   heparin  5,000 Units Subcutaneous Q8H   insulin aspart  0-5 Units Subcutaneous QHS   insulin aspart  0-9 Units Subcutaneous TID WC   insulin glargine-yfgn  10 Units Subcutaneous Daily   montelukast  10 mg Oral QHS   multivitamin  1 tablet Oral QHS   pantoprazole  40 mg Oral BID   Vitamin D (Ergocalciferol)  50,000 Units Oral Weekly   acetaminophen **OR** acetaminophen, alteplase, calcium carbonate (dosed in mg elemental calcium), camphor-menthol **AND** hydrOXYzine, docusate sodium, heparin, labetalol, lidocaine (PF), lidocaine-prilocaine, ondansetron **OR** ondansetron (ZOFRAN) IV, oxyCODONE, pentafluoroprop-tetrafluoroeth, promethazine (PHENERGAN) injection (IM or IVPB), sorbitol, zolpidem  Assessment/ Plan:  [name] is a 26 y.o.  female with history of type 1 diabetes, hypertension,  diabetic neuropathy, chronic kidney disease stage IV with anemia and proteinuria now admitted with history of intractable nausea vomiting and was found to be uremic.  She was initiated on dialysis yesterday.  Patient presently has a permacath placed  Principal Problem:   Nausea and vomiting Active Problems:   AKI (acute kidney injury) ([place])   Depression, major, recurrent, moderate (HCC)   Diabetes mellitus ([place])   Hypertension   ESRD (end stage renal disease) ([place])     End Stage Renal Disease on hemodialysis: We will dialyze again on a 3K bath.  No results found for: "POTASSIUM"  Intake/Output Summary (Last 24 hours) at 04/25/2022 1403 Last data filed at 04/25/2022 0301 Gross per 24 hour  Intake 770.57 ml  Output 0 ml  Net 770.57 ml    2. Hypertension with chronic kidney disease:    BP (!) 161/102   Pulse 97   Temp 98.8 F (37.1 C)   Resp 16   Ht 5\' 6"  (1.676 m)   Wt 67 kg   LMP 04/12/2022   SpO2 100%   BMI 23.84 kg/m   3. Anemia of chronic kidney disease/ kidney injury/chronic disease/acute blood loss:   Lab Results  Component Value Date   HGB 7.7 (L) 04/25/2022  Continue anemia protocols.  4. Secondary Hyperparathyroidism:     Lab Results  Component Value Date   CALCIUM 7.7 (L) 04/25/2022   PHOS 4.1 04/25/2022    #5: Hypokalemia: Hypokalemia secondary to dialysis last night complicated by sodium bicarbonate.  We will discontinue sodium bicarbonate at this time.    #6: Metabolic acidosis: Acidosis will be corrected with ongoing dialysis.  Will D/C sodium bicarbonate.  #7: Diabetes: Treatment as per primary team.  We will continue to monitor closely.   LOS: [place], [facility] 6/10/20232:03 PM

## 2022-04-26 LAB — GLUCOSE, CAPILLARY
Glucose-Capillary: 287 mg/dL — ABNORMAL HIGH (ref 70–99)
Glucose-Capillary: 152 mg/dL — ABNORMAL HIGH (ref 70–99)
Glucose-Capillary: 150 mg/dL — ABNORMAL HIGH (ref 70–99)
Glucose-Capillary: 207 mg/dL — ABNORMAL HIGH (ref 70–99)

## 2022-04-26 LAB — RENAL FUNCTION PANEL
Albumin: 2 g/dL — ABNORMAL LOW (ref 3.5–5.0)
Anion gap: 6 (ref 5–15)
BUN: 18 mg/dL (ref 6–20)
CO2: 24 mmol/L (ref 22–32)
Calcium: 7.6 mg/dL — ABNORMAL LOW (ref 8.9–10.3)
Chloride: 104 mmol/L (ref 98–111)
Creatinine, Ser: 3.84 mg/dL — ABNORMAL HIGH (ref 0.44–1.00)
GFR, Estimated: 16 mL/min — ABNORMAL LOW (ref >60)
Glucose, Bld: 302 mg/dL — ABNORMAL HIGH (ref 70–99)
Phosphorus: 3.4 mg/dL (ref 2.5–4.6)
Potassium: 3.2 mmol/L — ABNORMAL LOW (ref 3.5–5.1)
Sodium: 134 mmol/L — ABNORMAL LOW (ref 135–145)

## 2022-04-26 LAB — CBC
HCT: 21.4 % — ABNORMAL LOW (ref 36.0–46.0)
Hemoglobin: 7.2 g/dL — ABNORMAL LOW (ref 12.0–15.0)
MCH: 28.7 pg (ref 26.0–34.0)
MCHC: 33.6 g/dL (ref 30.0–36.0)
MCV: 85.3 fL (ref 80.0–100.0)
Platelets: 289 10*3/uL (ref 150–400)
RBC: 2.51 MIL/uL — ABNORMAL LOW (ref 3.87–5.11)
RDW: 12.2 % (ref 11.5–15.5)
WBC: 7 10*3/uL (ref 4.0–10.5)
nRBC: 0 % (ref 0.0–0.2)

## 2022-04-26 LAB — PREPARE RBC (CROSSMATCH)

## 2022-04-26 LAB — ABO/RH: ABO/RH(D): O POS

## 2022-04-26 MED ORDER — SODIUM CHLORIDE 0.9% IV SOLUTION: Freq: Once | INTRAVENOUS | Status: AC

## 2022-04-26 MED ORDER — INSULIN GLARGINE-YFGN 100 UNIT/ML ~~LOC~~ SOLN
14.0000 [IU] | Freq: Every day | SUBCUTANEOUS | Status: DC
  Administered 2022-04-26 – 2022-04-27 (×2): 14 [IU] via SUBCUTANEOUS
  Filled 2022-04-26 (×2): qty 0.14

## 2022-04-26 MED ORDER — POTASSIUM CHLORIDE CRYS ER 20 MEQ PO TBCR
40.0000 meq | EXTENDED_RELEASE_TABLET | Freq: Once | ORAL | Status: AC
  Administered 2022-04-26: 40 meq via ORAL
  Filled 2022-04-26: qty 2

## 2022-04-26 NOTE — Plan of Care (Signed)
  Problem: Education: Goal: Knowledge of General Education information will improve Description: Including pain rating scale, medication(s)/side effects and non-pharmacologic comfort measures Outcome: Progressing   Problem: Health Behavior/Discharge Planning: Goal: Ability to manage health-related needs will improve Outcome: Progressing   Problem: Activity: Goal: Risk for activity intolerance will decrease Outcome: Progressing   Problem: Nutrition: Goal: Adequate nutrition will be maintained Outcome: Progressing   Problem: Elimination: Goal: Will not experience complications related to bowel motility Outcome: Progressing Goal: Will not experience complications related to urinary retention Outcome: Progressing   Problem: Pain Managment: Goal: General experience of comfort will improve Outcome: Progressing   Problem: Safety: Goal: Ability to remain free from injury will improve Outcome: Progressing

## 2022-04-26 NOTE — Progress Notes (Signed)
Progress Note    Emma Thomas  CVE:[id number] DOB: 08-Feb-1996  DOA: 04/19/2022 PCP: Margarita Rana, MD      Brief Narrative:    Medical records reviewed and are as summarized below:  Emma Thomas is a 26 y.o. female with medical history significant for type I DM with frequent DKA's, hypertension, diabetic neuropathy, CKD stage IV who, who presented to the hospital with intractable nausea and vomiting.         Assessment/Plan:   Principal Problem:   Nausea and vomiting Active Problems:   AKI (acute kidney injury) ([employer])   Depression, major, recurrent, moderate (HCC)   Diabetes mellitus ([place])   Hypertension   ESRD (end stage renal disease) ([place])   Body mass index is 24.8 kg/m.  Intractable nausea and vomiting: Improved.  Continue antiemetics as needed.  Worsening anemia of chronic disease: Transfuse 1 unit of PRBCs.  She said she has been transfused with PRBCs in the past.  Risks, benefits and alternatives discussed with the patient and she is agreeable to blood transfusion.  AKI on CKD stage IV progressed to ESRD, metabolic acidosis, secondary hyperparathyroidism: Hemodialysis was initiated on 04/24/2022.  Follow-up with nephrologist for hemodialysis.  Hypokalemia: Replete potassium and monitor levels.  Type I DM with hyperglycemia, recent hypoglycemia: Increase Semglee from 10 units to 14 units daily.  Continue NovoLog as needed.  Other comorbidities include hypertension, depression, anemia of chronic disease    Diet Order             Diet renal/carb modified with fluid restriction Diet-HS Snack? Nothing; Fluid restriction: 1200 mL Fluid; Room service appropriate? Yes; Fluid consistency: Thin  Diet effective now                            Consultants: Nephrologist, vascular surgeon  Procedures: Right IJ permacath placed on 04/24/2022    Medications:    amLODipine  10 mg Oral Daily   Chlorhexidine Gluconate Cloth  6 each  Topical Q0600   feeding supplement (GLUCERNA SHAKE)  237 mL Oral TID WC   ferrous gluconate  324 mg Oral Q breakfast   heparin  5,000 Units Subcutaneous Q8H   insulin aspart  0-5 Units Subcutaneous QHS   insulin aspart  0-9 Units Subcutaneous TID WC   insulin glargine-yfgn  14 Units Subcutaneous Daily   montelukast  10 mg Oral QHS   multivitamin  1 tablet Oral QHS   pantoprazole  40 mg Oral BID   Vitamin D (Ergocalciferol)  50,000 Units Oral Weekly   Continuous Infusions:  promethazine (PHENERGAN) injection (IM or IVPB) Stopped (04/24/22 1146)     Anti-infectives (From admission, onward)    Start     Dose/Rate Route Frequency Ordered Stop   04/24/22 0048  ceFAZolin (ANCEF) IVPB 1 g/50 mL premix        1 g 100 mL/hr over 30 Minutes Intravenous 30 min pre-op 04/24/22 0049 04/24/22 1619              Family Communication/Anticipated D/C date and plan/Code Status   DVT prophylaxis: Place and maintain sequential compression device Start: 04/24/22 1514 heparin injection 5,000 Units Start: 04/19/22 2230     Code Status: Full Code  Family Communication: None Disposition Plan: Plan to discharge home in 2 to 3 days   Status is: Inpatient Remains inpatient appropriate because: Nausea, worsening kidney function       Subjective:  Interval events noted.  She feels better.  She feels weak.  No nausea or vomiting.  Appetite is better.  Objective:    Vitals:   04/26/22 0443 04/26/22 0745 04/26/22 1145 04/26/22 1215  BP: 122/75 119/76 129/84 120/82  Pulse: 89 95 92 100  Resp: 16 18 16 16   Temp: 98.3 F (36.8 C) 98.7 F (37.1 C) 99.1 F (37.3 C) 98.8 F (37.1 C)  TempSrc: Oral  Oral Oral  SpO2: 99% 99% 99% 100%  Weight:  69.7 kg    Height:       No data found.   Intake/Output Summary (Last 24 hours) at 04/26/2022 1339 Last data filed at 04/25/2022 1635 Gross per 24 hour  Intake --  Output 0 ml  Net 0 ml   Filed Weights   04/24/22 2200 04/25/22 1635  04/26/22 0745  Weight: 67 kg 67.5 kg 69.7 kg    Exam:  GEN: NAD SKIN: No rash EYES: EOMI ENT: MMM CV: RRR PULM: CTA B ABD: soft, ND, NT, +BS CNS: AAO x 3, non focal EXT: No edema or tenderness          Data Reviewed:   I have personally reviewed following labs and imaging studies:  Labs: Labs show the following:   Basic Metabolic Panel: Recent Labs  Lab 04/19/22 1813 04/20/22 0733 04/22/22 0537 04/23/22 0543 04/24/22 0553 04/25/22 0517 04/26/22 0633  NA  --    < > 135 136 135 138 134*  K  --    < > 3.4* 3.1* 3.5 2.9* 3.2*  CL  --    < > 110 113* 113* 111 104  CO2  --    < > 15* 16* 10* 22 24  GLUCOSE  --    < > 182* 64* 165* 129* 302*  BUN  --    < > 72* 66* 60* 33* 18  CREATININE  --    < > 7.74* 7.48* 7.31* 5.27* 3.84*  CALCIUM  --    < > 7.7* 7.5* 7.7* 7.7* 7.6*  MG 2.4  --  2.5*  --  2.2  --   --   PHOS  --    < > 6.9* 6.2* 6.1* 4.1 3.4   < > = values in this interval not displayed.   GFR Estimated Creatinine Clearance: 20.6 mL/min (A) (by C-G formula based on SCr of 3.84 mg/dL (H)). Liver Function Tests: Recent Labs  Lab 04/19/22 1705 04/20/22 0733 04/21/22 0954 04/22/22 0537 04/23/22 0543 04/24/22 0553 04/25/22 0517 04/26/22 0633  AST 21 13*  --   --   --   --   --   --   ALT 17 14  --   --   --   --   --   --   ALKPHOS 52 44  --   --   --   --   --   --   BILITOT 0.7 1.1  --   --   --   --   --   --   PROT 7.4 6.3*  --   --   --   --   --   --   ALBUMIN 2.8* 2.5*   < > 2.2* 2.0* 2.1* 2.1* 2.0*   < > = values in this interval not displayed.   Recent Labs  Lab 04/19/22 1705  LIPASE 21   No results for input(s): "AMMONIA" in the last 168 hours. Coagulation profile No results for input(s): "INR", "  PROTIME" in the last 168 hours.  CBC: Recent Labs  Lab 04/19/22 1705 04/20/22 0733 04/22/22 0537 04/23/22 0543 04/24/22 0553 04/25/22 0517 04/26/22 0633  WBC 10.4   < > 8.8 8.5 8.8 6.9 7.0  NEUTROABS 9.2*  --   --   --   --   --    --   HGB 7.7*   < > 8.4* 7.9* 7.3* 7.7* 7.2*  HCT 24.0*   < > 25.3* 24.2* 23.3* 23.4* 21.4*  MCV 88.6   < > 88.2 86.4 89.3 85.4 85.3  PLT 326   < > 314 331 323 323 289   < > = values in this interval not displayed.   Cardiac Enzymes: No results for input(s): "CKTOTAL", "CKMB", "CKMBINDEX", "TROPONINI" in the last 168 hours. BNP (last 3 results) No results for input(s): "PROBNP" in the last 8760 hours. CBG: Recent Labs  Lab 04/25/22 1144 04/25/22 1743 04/25/22 2108 04/26/22 0747 04/26/22 1129  GLUCAP 93 99 184* 287* 152*   D-Dimer: No results for input(s): "DDIMER" in the last 72 hours. Hgb A1c: No results for input(s): "HGBA1C" in the last 72 hours.  Lipid Profile: No results for input(s): "CHOL", "HDL", "LDLCALC", "TRIG", "CHOLHDL", "LDLDIRECT" in the last 72 hours. Thyroid function studies: No results for input(s): "TSH", "T4TOTAL", "T3FREE", "THYROIDAB" in the last 72 hours.  Invalid input(s): "FREET3" Anemia work up: Recent Labs    04/25/22 1818  FERRITIN 155  TIBC 248*  IRON 45   Sepsis Labs: Recent Labs  Lab 04/23/22 0543 04/24/22 0553 04/25/22 0517 04/26/22 0633  WBC 8.5 8.8 6.9 7.0    Microbiology No results found for this or any previous visit (from the past 240 hour(s)).  Procedures and diagnostic studies:  PERIPHERAL VASCULAR CATHETERIZATION  Result Date: 04/24/2022 See surgical note for result.              LOS: 7 days   Emma Thomas  [facility]   Pager on [email or web address]. If 7PM-7AM, please contact night-coverage at [email or web address]     04/26/2022, 1:39 PM

## 2022-04-26 NOTE — Progress Notes (Signed)
[facility]  PROGRESS NOTE   Subjective:   Patient sitting in bed.  She does not like the renal diet. Had 2 and half hours of dialysis yesterday. Potassium is low and is being supplemented.  Objective:  Vital signs: Blood pressure 129/84, pulse 92, temperature 99.1 F (37.3 C), temperature source Oral, resp. rate 16, height 5\' 6"  (1.676 m), weight 69.7 kg, last menstrual period 04/12/2022, SpO2 99 %, unknown if currently breastfeeding.  Intake/Output Summary (Last 24 hours) at 04/26/2022 1206 Last data filed at 04/25/2022 1635 Gross per 24 hour  Intake --  Output 0 ml  Net 0 ml   Filed Weights   04/24/22 2200 04/25/22 1635 04/26/22 0745  Weight: 67 kg 67.5 kg 69.7 kg     Physical Exam: General:  No acute distress  Head:  Normocephalic, atraumatic. Moist oral mucosal membranes  Eyes:  Anicteric  Neck:  Supple  Lungs:   Clear to auscultation, normal effort  Heart:  S1S2 no rubs  Abdomen:   Soft, nontender, bowel sounds present  Extremities:  peripheral edema.  Neurologic:  Awake, alert, following commands  Skin:  No lesions  Access:     Basic Metabolic Panel: Recent Labs  Lab 04/19/22 1813 04/20/22 0733 04/22/22 0537 04/23/22 0543 04/24/22 0553 04/25/22 0517 04/26/22 0633  NA  --    < > 135 136 135 138 134*  K  --    < > 3.4* 3.1* 3.5 2.9* 3.2*  CL  --    < > 110 113* 113* 111 104  CO2  --    < > 15* 16* 10* 22 24  GLUCOSE  --    < > 182* 64* 165* 129* 302*  BUN  --    < > 72* 66* 60* 33* 18  CREATININE  --    < > 7.74* 7.48* 7.31* 5.27* 3.84*  CALCIUM  --    < > 7.7* 7.5* 7.7* 7.7* 7.6*  MG 2.4  --  2.5*  --  2.2  --   --   PHOS  --    < > 6.9* 6.2* 6.1* 4.1 3.4   < > = values in this interval not displayed.    CBC: Recent Labs  Lab 04/19/22 1705 04/20/22 0733 04/22/22 0537 04/23/22 0543 04/24/22 0553 04/25/22 0517 04/26/22 0633  WBC 10.4   < > 8.8 8.5 8.8 6.9 7.0  NEUTROABS 9.2*  --   --   --   --   --   --   HGB 7.7*   < > 8.4*  7.9* 7.3* 7.7* 7.2*  HCT 24.0*   < > 25.3* 24.2* 23.3* 23.4* 21.4*  MCV 88.6   < > 88.2 86.4 89.3 85.4 85.3  PLT 326   < > 314 331 323 323 289   < > = values in this interval not displayed.     Urinalysis: No results for input(s): "COLORURINE", "LABSPEC", "PHURINE", "GLUCOSEU", "HGBUR", "BILIRUBINUR", "KETONESUR", "PROTEINUR", "UROBILINOGEN", "NITRITE", "LEUKOCYTESUR" in the last 72 hours.  Invalid input(s): "APPERANCEUR"    Imaging: PERIPHERAL VASCULAR CATHETERIZATION  Result Date: 04/24/2022 See surgical note for result.    Medications:    promethazine (PHENERGAN) injection (IM or IVPB) Stopped (04/24/22 1146)    amLODipine  10 mg Oral Daily   Chlorhexidine Gluconate Cloth  6 each Topical Q0600   feeding supplement (GLUCERNA SHAKE)  237 mL Oral TID WC   ferrous gluconate  324 mg Oral Q breakfast   heparin  5,000  Units Subcutaneous Q8H   insulin aspart  0-5 Units Subcutaneous QHS   insulin aspart  0-9 Units Subcutaneous TID WC   insulin glargine-yfgn  14 Units Subcutaneous Daily   montelukast  10 mg Oral QHS   multivitamin  1 tablet Oral QHS   pantoprazole  40 mg Oral BID   Vitamin D (Ergocalciferol)  50,000 Units Oral Weekly    Assessment/ Plan:     Principal Problem:   Nausea and vomiting Active Problems:   AKI (acute kidney injury) ([place])   Depression, major, recurrent, moderate ([place])   Diabetes mellitus ([place])   Hypertension   ESRD (end stage renal disease) ([place])  [name] is a 26 y.o.  female with history of type 1 diabetes, hypertension, diabetic neuropathy, chronic kidney disease stage IV with anemia and proteinuria now admitted with history of intractable nausea vomiting and was found to be uremic.  She had 2 dialysis sessions so far.  Patient presently has a permacath placed.  #1: End-stage renal disease: Patient had 2 sessions of dialysis so far.  She has a permacath placed.  Her next dialysis will be on Tuesday.  #2: Hypokalemia: We will  supplement potassium again today.  #3: Anemia: Patient has been on oral iron supplementation.  She will need erythropoietin and IV iron supplementation during dialysis.  #4: Secondary hyperparathyroidism: Patient will need to be started on vitamin D3.  #5: Diabetes: Continue insulin as ordered.  #6: Metabolic acidosis: Sodium bicarbonate discontinued yesterday.  Continue supportive care and plan for outpatient dialysis placement.    LOS: [place] 6/11/202312:06 PM

## 2022-04-27 ENCOUNTER — Encounter: Payer: Self-pay | Admitting: Vascular Surgery

## 2022-04-27 DIAGNOSIS — I10 Essential (primary) hypertension: Secondary | ICD-10-CM

## 2022-04-27 LAB — RENAL FUNCTION PANEL
Albumin: 2 g/dL — ABNORMAL LOW (ref 3.5–5.0)
Anion gap: 5 (ref 5–15)
BUN: 27 mg/dL — ABNORMAL HIGH (ref 6–20)
CO2: 25 mmol/L (ref 22–32)
Calcium: 7.8 mg/dL — ABNORMAL LOW (ref 8.9–10.3)
Chloride: 108 mmol/L (ref 98–111)
Creatinine, Ser: 4.28 mg/dL — ABNORMAL HIGH (ref 0.44–1.00)
GFR, Estimated: 14 mL/min — ABNORMAL LOW (ref >60)
Glucose, Bld: 89 mg/dL (ref 70–99)
Phosphorus: 3.9 mg/dL (ref 2.5–4.6)
Potassium: 3.3 mmol/L — ABNORMAL LOW (ref 3.5–5.1)
Sodium: 138 mmol/L (ref 135–145)

## 2022-04-27 LAB — BPAM RBC
Blood Product Expiration Date: 202306122359
ISSUE DATE / TIME: 202306111157
Unit Type and Rh: 5100

## 2022-04-27 LAB — TYPE AND SCREEN
ABO/RH(D): O POS
Antibody Screen: NEGATIVE
Unit division: 0

## 2022-04-27 LAB — GLUCOSE, CAPILLARY: Glucose-Capillary: 100 mg/dL — ABNORMAL HIGH (ref 70–99)

## 2022-04-27 LAB — CBC
HCT: 25.1 % — ABNORMAL LOW (ref 36.0–46.0)
Hemoglobin: 8.6 g/dL — ABNORMAL LOW (ref 12.0–15.0)
MCH: 29 pg (ref 26.0–34.0)
MCHC: 34.3 g/dL (ref 30.0–36.0)
MCV: 84.5 fL (ref 80.0–100.0)
Platelets: 310 10*3/uL (ref 150–400)
RBC: 2.97 MIL/uL — ABNORMAL LOW (ref 3.87–5.11)
RDW: 13 % (ref 11.5–15.5)
WBC: 9.3 10*3/uL (ref 4.0–10.5)
nRBC: 0 % (ref 0.0–0.2)

## 2022-04-27 MED ORDER — ACETAMINOPHEN 325 MG PO TABS: 650.0000 mg | ORAL_TABLET | Freq: Four times a day (QID) | ORAL | Status: DC | PRN

## 2022-04-27 MED ORDER — AMLODIPINE BESYLATE 10 MG PO TABS: 10.0000 mg | ORAL_TABLET | Freq: Every day | ORAL | 0 refills | Status: AC

## 2022-04-27 NOTE — Progress Notes (Signed)
[facility]  PROGRESS NOTE   Subjective:   Patient seen and evaluated during dialysis   HEMODIALYSIS FLOWSHEET:  Blood Flow Rate (mL/min): 300 mL/min Arterial Pressure (mmHg): -100 mmHg Venous Pressure (mmHg): 100 mmHg Transmembrane Pressure (mmHg): 50 mmHg Ultrafiltration Rate (mL/min): 170 mL/min Dialysate Flow Rate (mL/min): 500 ml/min Conductivity: 13.8 Conductivity: 13.8 Dialysis Fluid Bolus: Normal Saline Bolus Amount (mL): 250 mL  States she feels better, tolerating meals States she is ready to discharge.   Objective:  Vital signs: Blood pressure (!) 141/89, pulse 95, temperature 98.2 F (36.8 C), temperature source Oral, resp. rate (!) 21, height 5\' 6"  (1.676 m), weight 68.8 kg, last menstrual period 04/12/2022, SpO2 100 %, unknown if currently breastfeeding.  Intake/Output Summary (Last 24 hours) at 04/27/2022 1429 Last data filed at 04/27/2022 1245 Gross per 24 hour  Intake 591 ml  Output 200 ml  Net 391 ml    Filed Weights   04/26/22 0745 04/27/22 0500 04/27/22 1116  Weight: 69.7 kg 69.7 kg 68.8 kg     Physical Exam: General:  No acute distress  Head:  Normocephalic, atraumatic. Moist oral mucosal membranes  Eyes:  Anicteric  Neck:  Supple  Lungs:   Clear to auscultation, normal effort  Heart:  S1S2 no rubs  Abdomen:   Soft, nontender, bowel sounds present  Extremities:  peripheral edema.  Neurologic:  Awake, alert, following commands  Skin:  No lesions  Access: Rt permcath    Basic Metabolic Panel: Recent Labs  Lab 04/22/22 0537 04/23/22 0543 04/24/22 0553 04/25/22 0517 04/26/22 0633 04/27/22 0704  NA 135 136 135 138 134* 138  K 3.4* 3.1* 3.5 2.9* 3.2* 3.3*  CL 110 113* 113* 111 104 108  CO2 15* 16* 10* 22 24 25   GLUCOSE 182* 64* 165* 129* 302* 89  BUN 72* 66* 60* 33* 18 27*  CREATININE 7.74* 7.48* 7.31* 5.27* 3.84* 4.28*  CALCIUM 7.7* 7.5* 7.7* 7.7* 7.6* 7.8*  MG 2.5*  --  2.2  --   --   --   PHOS 6.9* 6.2* 6.1* 4.1  3.4 3.9     CBC: Recent Labs  Lab 04/23/22 0543 04/24/22 0553 04/25/22 0517 04/26/22 0633 04/27/22 0704  WBC 8.5 8.8 6.9 7.0 9.3  HGB 7.9* 7.3* 7.7* 7.2* 8.6*  HCT 24.2* 23.3* 23.4* 21.4* 25.1*  MCV 86.4 89.3 85.4 85.3 84.5  PLT 331 323 323 289 310      Urinalysis: No results for input(s): "COLORURINE", "LABSPEC", "PHURINE", "GLUCOSEU", "HGBUR", "BILIRUBINUR", "KETONESUR", "PROTEINUR", "UROBILINOGEN", "NITRITE", "LEUKOCYTESUR" in the last 72 hours.  Invalid input(s): "APPERANCEUR"    Imaging: No results found.   Medications:    promethazine (PHENERGAN) injection (IM or IVPB) Stopped (04/24/22 1146)    amLODipine  10 mg Oral Daily   Chlorhexidine Gluconate Cloth  6 each Topical Q0600   feeding supplement (GLUCERNA SHAKE)  237 mL Oral TID WC   ferrous gluconate  324 mg Oral Q breakfast   heparin  5,000 Units Subcutaneous Q8H   insulin aspart  0-5 Units Subcutaneous QHS   insulin aspart  0-9 Units Subcutaneous TID WC   insulin glargine-yfgn  14 Units Subcutaneous Daily   montelukast  10 mg Oral QHS   multivitamin  1 tablet Oral QHS   pantoprazole  40 mg Oral BID   Vitamin D (Ergocalciferol)  50,000 Units Oral Weekly    Assessment/ Plan:     Principal Problem:   Nausea and vomiting Active Problems:   AKI (acute kidney  injury) ([place])   Depression, major, recurrent, moderate ([place])   Diabetes mellitus ([place])   Hypertension   ESRD (end stage renal disease) ([place])  [name] is a 26 y.o.  female with history of type 1 diabetes, hypertension, diabetic neuropathy, chronic kidney disease stage IV with anemia and proteinuria now admitted with history of intractable nausea vomiting and was found to be uremic.  She had 2 dialysis sessions so far.  Patient presently has a permacath placed.  #1: End-stage renal disease: Receiving dialysis today, no UF. Her next dialysis will be on Tursday. Renal navigator has confirmed outpatient chair time at [facility] on  TTS with a start day of Thursday. Still awaiting financial clearance.   #2: Hypokalemia: Correcting with dialysis and improved nutrition  #3: Anemia with chronic kidney disease: Patient has been on oral iron supplementation.    #4: Secondary hyperparathyroidism: Receiving vitamin D3.  #5:  Diabetes mellitus type 1 with chronic kidney disease: Continue insulin as ordered.  #6: Acute metabolic acidosis: resolved     LOS: [facility] 6/12/20232:29 PM

## 2022-04-27 NOTE — Progress Notes (Signed)
Placement resolved: [facility] TTS 11:15am, start date: 6/15 at 11:00am. Patient is aware and will go to clinic on Wednesday 6/14 at 1:00pm. Education provided, plan is for patient to drive herself to treatments.

## 2022-04-27 NOTE — Progress Notes (Signed)
   04/25/22 1530 04/25/22 1545 04/25/22 1600  Vitals  Temp  --   --   --   Temp Source  --   --   --   BP (!) 162/107 (!) 158/106 (!) 158/98  MAP (mmHg) 122 119 116  BP Location  --   --   --   BP Method  --   --   --   Patient Position (if appropriate)  --   --   --   Pulse Rate 94 94 99  Pulse Rate Source  --   --   --   ECG Heart Rate 95 95 98  Resp 13 15 15   During Treatment Monitoring  Blood Flow Rate (mL/min) 250 mL/min 250 mL/min 250 mL/min  Arterial Pressure (mmHg) -70 mmHg -70 mmHg -70 mmHg  Venous Pressure (mmHg) 70 mmHg 70 mmHg 70 mmHg  Transmembrane Pressure (mmHg) 30 mmHg 30 mmHg 30 mmHg  Ultrafiltration Rate (mL/min) 200 mL/min 200 mL/min 200 mL/min  Dialysate Flow Rate (mL/min) 300 ml/min 300 ml/min 300 ml/min  Conductivity 14 14 14   Intra-Hemodialysis Comments Progressing as prescribed Progressing as prescribed Progressing as prescribed    04/25/22 1615 04/25/22 1630 04/25/22 1635  Vitals  Temp  --   --  99.1 F (37.3 C)  Temp Source  --   --  Oral  BP (!) 166/106 (!) 158/110 (!) 170/98  MAP (mmHg) 123 122 116  BP Location  --   --  Left Arm  BP Method  --   --  Automatic  Patient Position (if appropriate)  --   --  Lying  Pulse Rate 95 95 94  Pulse Rate Source  --   --  Monitor  ECG Heart Rate 96 96 95  Resp 15 18 13   During Treatment Monitoring  Blood Flow Rate (mL/min) 250 mL/min 250 mL/min  --   Arterial Pressure (mmHg) -70 mmHg -70 mmHg  --   Venous Pressure (mmHg) 70 mmHg 70 mmHg  --   Transmembrane Pressure (mmHg) 30 mmHg 30 mmHg  --   Ultrafiltration Rate (mL/min) 200 mL/min 200 mL/min  --   Dialysate Flow Rate (mL/min) 300 ml/min 300 ml/min  --   Conductivity 14 14  --   Intra-Hemodialysis Comments Progressing as prescribed Progressing as prescribed Tolerated well;Tx completed

## 2022-04-27 NOTE — TOC Transition Note (Signed)
Transition of Care [facility]) - CM/SW Discharge Note   Patient Details  Name: Emma Thomas MRN: [id number] Date of Birth: 1996-10-27  [facility] CM/SW Contact:  Candie Chroman, LCSW Phone Number: 04/27/2022, 3:56 PM   Clinical Narrative: Patient has orders to discharge home today. No further concerns. CSW signing off.    Final next level of care: Home/Self Care Barriers to Discharge: Barriers Resolved   Patient Goals and CMS Choice        Discharge Placement                Patient to be transferred to facility by: Husband   Patient and family notified of of transfer: 04/27/22  Discharge Plan and Services     Post Acute Care Choice: NA                               Social Determinants of Health (SDOH) Interventions     Readmission Risk Interventions    04/24/2022    1:30 PM  Readmission Risk Prevention Plan  Transportation Screening Complete  PCP or Specialist Appt within 3-5 Days Complete  HRI or [place] Complete  Social Work Consult for [place] Planning/Counseling Complete  Palliative Care Screening Not Applicable  Medication Review [employer]) Complete

## 2022-04-27 NOTE — Progress Notes (Signed)
Working on getting patient placed at [facility].

## 2022-04-27 NOTE — Progress Notes (Signed)
Hemodialysis Post Treatment Note  April 27, 2022  Access: RIJ CVC  UF Removed: 0 ML  BFR: 300  Next Scheduled Treatment: TBD  Note:  Patient presents for day 3 of a new patient start for hemodialysis, RIJ CVC intact, no signs of infection. CVC functions marginally well, lines reversed due high arterial pressure. Patient tolerates treatment without incident, no fluid removed. Anticipating discharge from facility, with TTS schedule for in center treatment. Handoff given, patient transported to assigned room.

## 2022-04-27 NOTE — Progress Notes (Signed)
   04/24/22 2100 04/24/22 2115 04/24/22 2130  Vitals  BP (!) 157/101 (!) 152/101 (!) 143/94  MAP (mmHg) 115 115 109  Pulse Rate 93 92 91  ECG Heart Rate 93 92 90  Resp 17 17 15   During Treatment Monitoring  Blood Flow Rate (mL/min) 200 mL/min 200 mL/min 200 mL/min  Arterial Pressure (mmHg) -70 mmHg -70 mmHg -70 mmHg  Venous Pressure (mmHg) 70 mmHg 70 mmHg 70 mmHg  Transmembrane Pressure (mmHg) 30 mmHg 30 mmHg 30 mmHg  Ultrafiltration Rate (mL/min) 250 mL/min 250 mL/min 250 mL/min  Dialysate Flow Rate (mL/min) 300 ml/min 300 ml/min 300 ml/min  Conductivity 13.9 13.9 13.9  HD Safety Checks Performed Yes Yes Yes  Intra-Hemodialysis Comments Progressing as prescribed Progressing as prescribed Progressing as prescribed    04/24/22 2145 04/24/22 2147  Vitals  BP (!) 145/99  --   MAP (mmHg) 113  --   Pulse Rate 93  --   ECG Heart Rate 93  --   Resp 16  --   During Treatment Monitoring  Blood Flow Rate (mL/min) 200 mL/min  --   Arterial Pressure (mmHg) -70 mmHg  --   Venous Pressure (mmHg) 70 mmHg  --   Transmembrane Pressure (mmHg) 30 mmHg  --   Ultrafiltration Rate (mL/min) 250 mL/min  --   Dialysate Flow Rate (mL/min) 300 ml/min  --   Conductivity 13.9  --   HD Safety Checks Performed Yes  --   Intra-Hemodialysis Comments Progressing as prescribed Tx completed

## 2022-04-27 NOTE — Progress Notes (Signed)
   04/24/22 1941 04/24/22 1945 04/24/22 2000  Vitals  BP  --  (!) 152/92 137/88  MAP (mmHg)  --  109 100  Pulse Rate  --  94 90  ECG Heart Rate  --  94 90  Resp  --  19 15  During Treatment Monitoring  Blood Flow Rate (mL/min) 200 mL/min 200 mL/min 200 mL/min  Arterial Pressure (mmHg) -50 mmHg -60 mmHg -60 mmHg  Venous Pressure (mmHg) 60 mmHg 60 mmHg 60 mmHg  Transmembrane Pressure (mmHg) 40 mmHg 40 mmHg 40 mmHg  Ultrafiltration Rate (mL/min) 250 mL/min 250 mL/min 250 mL/min  Dialysate Flow Rate (mL/min) 300 ml/min 300 ml/min 300 ml/min  Conductivity 13.9 13.9 13.9  HD Safety Checks Performed Yes Yes Yes  Dialysis Fluid Bolus Normal Saline  --   --   Bolus Amount (mL) 250 mL  --   --   Intra-Hemodialysis Comments Tx initiated Progressing as prescribed Progressing as prescribed    04/24/22 2015 04/24/22 2030 04/24/22 2045  Vitals  BP (!) 140/91 (!) 148/98 (!) 160/105  MAP (mmHg) 103 113 121  Pulse Rate 94 95 94  ECG Heart Rate 94 95 98  Resp 17 19 17   During Treatment Monitoring  Blood Flow Rate (mL/min) 200 mL/min 200 mL/min 200 mL/min  Arterial Pressure (mmHg) -60 mmHg -70 mmHg -70 mmHg  Venous Pressure (mmHg) 60 mmHg 70 mmHg 70 mmHg  Transmembrane Pressure (mmHg) 40 mmHg 30 mmHg 30 mmHg  Ultrafiltration Rate (mL/min) 250 mL/min 250 mL/min 250 mL/min  Dialysate Flow Rate (mL/min) 300 ml/min 300 ml/min 300 ml/min  Conductivity 13.9 13.9 13.9  HD Safety Checks Performed Yes Yes Yes  Dialysis Fluid Bolus  --   --   --   Bolus Amount (mL)  --   --   --   Intra-Hemodialysis Comments Progressing as prescribed Progressing as prescribed Progressing as prescribed

## 2022-04-27 NOTE — Discharge Summary (Signed)
Physician Discharge Summary   Patient: Emma Thomas MRN: [id number] DOB: 11-15-1996  Admit date:     04/19/2022  Discharge date: 04/27/22  Discharge Physician: Jennye Boroughs   PCP: Margarita Rana, MD   Recommendations at discharge:   Follow-up with PCP in 1 week.  Follow up with [facility] dialysis center for hemodialysis on Tuesdays, Thursdays and Saturdays.  Discharge Diagnoses: Principal Problem:   Nausea and vomiting Active Problems:   AKI (acute kidney injury) ([place])   Depression, major, recurrent, moderate (HCC)   Diabetes mellitus ([place])   Hypertension   ESRD (end stage renal disease) ([place])  Resolved Problems:   * No resolved hospital problems. *  Hospital Course:  Ms.Emma Thomas is a 26 y.o. female with medical history significant for type I DM with frequent DKA's, hypertension, diabetic neuropathy, CKD stage IV who, who presented to the hospital with intractable nausea and vomiting.   She was treated with antiemetics and IV fluids. She had poor oral intake and persistent nausea and also difficult to control. Her kidney function did not improve with IV fluids.  She progressed from CKD stage IV to ESRD.  Persistent nausea and poor oral intake were attributed to uremic symptoms from ESRD.  Vascular surgeon was consulted for permacath placement and hemodialysis was initiated on this admission.  BUN and creatinine improved with hemodialysis.  Nausea got better and appetite improved.   She had hypokalemia that was treated. She had episodes of hyperglycemia and hypoglycemia that necessitated adjustment in insulin dose. She was transfused with 1 unit of prbc for symptomatic anemia of chronic kidney disease. Her condition has improved and she's deemed stable for discharge.           Consultants: Nephrologist, Vascular surgeon Procedures performed: Permcath placement right IJ   Disposition: Home Diet recommendation:  Discharge Diet Orders (From admission,  onward)     Start     Ordered   04/27/22 0000  Diet Carb Modified        04/27/22 1554   04/27/22 0000  Diet renal with fluid restriction        04/27/22 1554           Renal diet DISCHARGE MEDICATION: Allergies as of 04/27/2022   No Known Allergies      Medication List     STOP taking these medications    bumetanide 1 MG tablet Commonly known as: BUMEX   oxyCODONE 5 MG immediate release tablet Commonly known as: Oxy IR/ROXICODONE       TAKE these medications    acetaminophen 325 MG tablet Commonly known as: TYLENOL Take 2 tablets (650 mg total) by mouth every 6 (six) hours as needed for mild pain (or Fever >/= 101).   amLODipine 10 MG tablet Commonly known as: NORVASC Take 1 tablet (10 mg total) by mouth daily. Start taking on: April 28, 2022   atorvastatin 40 MG tablet Commonly known as: LIPITOR Take 40 mg by mouth daily.   ferrous gluconate 324 MG tablet Commonly known as: FERGON Take by mouth.   insulin glargine 100 UNIT/ML injection Commonly known as: LANTUS Inject 14 Units into the skin 2 (two) times daily. 14u in morning and 12u at night   insulin lispro 100 UNIT/ML injection Commonly known as: HUMALOG Inject into the skin 3 (three) times daily before meals.   Junel 1/20 1-20 MG-MCG tablet Generic drug: norethindrone-ethinyl estradiol Take 1 tablet by mouth daily.   montelukast 10 MG tablet Commonly known  as: SINGULAIR Take 10 mg by mouth daily.   multivitamin capsule Take 1 capsule by mouth daily.   ondansetron 4 MG disintegrating tablet Commonly known as: ZOFRAN-ODT Take 4 mg by mouth every 8 (eight) hours as needed.   sodium bicarbonate 650 MG tablet Take 1,300 mg by mouth 2 (two) times daily.   SUMAtriptan 50 MG tablet Commonly known as: IMITREX Take 50 mg by mouth daily as needed.   Vitamin D (Ergocalciferol) 1.25 MG (50000 UNIT) Caps capsule Commonly known as: DRISDOL Take 50,000 Units by mouth once a week.         Discharge Exam: Filed Weights   04/27/22 0500 04/27/22 1116 04/27/22 1440  Weight: 69.7 kg 68.8 kg 68.2 kg   GEN: NAD SKIN: No rash EYES: EOMI ENT: MMM CV: RRR PULM: CTA B ABD: soft, ND, NT, +BS CNS: AAO x 3, non focal EXT: No edema or tenderness   Condition at discharge: good  The results of significant diagnostics from this hospitalization (including imaging, microbiology, ancillary and laboratory) are listed below for reference.   Imaging Studies: PERIPHERAL VASCULAR CATHETERIZATION  Result Date: 04/24/2022 See surgical note for result.  DG Chest 1 View  Result Date: 04/22/2022 CLINICAL DATA:  Reason for exam: Kidney damage EXAM: CHEST  1 VIEW COMPARISON:  08/07/2018 FINDINGS: Low lung volumes with crowding of bronchovascular structures. No focal infiltrate or overt edema. Heart size and mediastinal contours are within normal limits. No effusion. Visualized bones unremarkable. IMPRESSION: Low lung volumes.  No acute findings. Electronically Signed   By: Lucrezia Europe M.D.   On: 04/22/2022 17:34   CT ABDOMEN PELVIS WO CONTRAST  Result Date: 04/19/2022 CLINICAL DATA:  Abdominal pain, nausea, vomiting, diarrhea EXAM: CT ABDOMEN AND PELVIS WITHOUT CONTRAST TECHNIQUE: Multidetector CT imaging of the abdomen and pelvis was performed following the standard protocol without IV contrast. RADIATION DOSE REDUCTION: This exam was performed according to the departmental dose-optimization program which includes automated exposure control, adjustment of the mA and/or kV according to patient size and/or use of iterative reconstruction technique. COMPARISON:  None Available. FINDINGS: Lower chest: Unremarkable. Hepatobiliary: No focal abnormality is seen in the liver. There is no dilation of bile ducts. Gallbladder is slightly distended. There is no wall thickening. There is increased density in the dependent portion of gallbladder lumen. Pancreas: There is atrophy.  No focal abnormality is seen.  Spleen: Unremarkable. Adrenals/Urinary Tract: Adrenals are unremarkable. There is no hydronephrosis. 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 There are no renal or ureteral stones. Urinary bladder is unremarkable. Stomach/Bowel: Small hiatal hernia is seen. There is wall thickening in the lower thoracic esophagus possibly suggesting reflux esophagitis. There is mild diffuse wall thickening in the small bowel loops. There is minimal stranding adjacent to the small bowel loops. Appendix is unremarkable. There is mild diffuse wall thickening in the colon. This may be due to incomplete distention or suggest nonspecific inflammation. Vascular/Lymphatic: Unremarkable. Reproductive: Unremarkable. Other: There is minimal ascites. There is no pneumoperitoneum. Small umbilical hernia containing fat is seen. Musculoskeletal: Unremarkable. IMPRESSION: There is no evidence of intestinal obstruction or pneumoperitoneum. There is no hydronephrosis. Appendix is not dilated. There is mild diffuse wall thickening in the small bowel loops and colon. This may be due to incomplete distention or suggest nonspecific enterocolitis. There is minimal stranding in the fat planes adjacent to small bowel loops. There is minimal ascites. Subtle increased density is seen in the dependent portion of gallbladder lumen suggesting presence of sludge  and possibly tiny stones. There are no signs of acute cholecystitis. There is no dilation of bile ducts. Electronically Signed   By: Elmer Picker M.D.   On: 04/19/2022 19:20    Microbiology: Results for orders placed or performed during the hospital encounter of 02/13/20  Respiratory Panel by RT PCR (Flu A&B, Covid) - Nasopharyngeal Swab     Status: None   Collection Time: 02/13/20  2:41 PM   Specimen: Nasopharyngeal Swab  Result Value Ref Range Status   SARS Coronavirus 2 by RT PCR NEGATIVE NEGATIVE Final    Comment: (NOTE) SARS-CoV-2 target nucleic acids are NOT  DETECTED. The SARS-CoV-2 RNA is generally detectable in upper respiratoy specimens during the acute phase of infection. The lowest concentration of SARS-CoV-2 viral copies this assay can detect is 131 copies/mL. A negative result does not preclude SARS-Cov-2 infection and should not be used as the sole basis for treatment or other patient management decisions. A negative result may occur with  improper specimen collection/handling, submission of specimen other than nasopharyngeal swab, presence of viral mutation(s) within the areas targeted by this assay, and inadequate number of viral copies (<131 copies/mL). A negative result must be combined with clinical observations, patient history, and epidemiological information. The expected result is Negative. Fact Sheet for Patients:  PinkCheek.be Fact Sheet for Healthcare Providers:  GravelBags.it This test is not yet ap proved or cleared by the [place] FDA and  has been authorized for detection and/or diagnosis of SARS-CoV-2 by FDA under an Emergency Use Authorization (EUA). This EUA will remain  in effect (meaning this test can be used) for the duration of the COVID-19 declaration under Section 564(b)(1) of the Act, 21 U.S.C. section 360bbb-3(b)(1), unless the authorization is terminated or revoked sooner.    Influenza A by PCR NEGATIVE NEGATIVE Final   Influenza B by PCR NEGATIVE NEGATIVE Final    Comment: (NOTE) The Xpert Xpress SARS-CoV-2/FLU/RSV assay is intended as an aid in  the diagnosis of influenza from Nasopharyngeal swab specimens and  should not be used as a sole basis for treatment. Nasal washings and  aspirates are unacceptable for Xpert Xpress SARS-CoV-2/FLU/RSV  testing. Fact Sheet for Patients: PinkCheek.be Fact Sheet for Healthcare Providers: GravelBags.it This test is not yet approved or cleared  by the [place] FDA and  has been authorized for detection and/or diagnosis of SARS-CoV-2 by  FDA under an Emergency Use Authorization (EUA). This EUA will remain  in effect (meaning this test can be used) for the duration of the  Covid-19 declaration under Section 564(b)(1) of the Act, 21  U.S.C. section 360bbb-3(b)(1), unless the authorization is  terminated or revoked. Performed at [facility], [address] [place], [place] [address]     Labs: CBC: Recent Labs  Lab 04/23/22 0543 04/24/22 0553 04/25/22 0517 04/26/22 0633 04/27/22 0704  WBC 8.5 8.8 6.9 7.0 9.3  HGB 7.9* 7.3* 7.7* 7.2* 8.6*  HCT 24.2* 23.3* 23.4* 21.4* 25.1*  MCV 86.4 89.3 85.4 85.3 84.5  PLT 331 323 323 289 354   Basic Metabolic Panel: Recent Labs  Lab 04/22/22 0537 04/23/22 0543 04/24/22 0553 04/25/22 0517 04/26/22 0633 04/27/22 0704  NA 135 136 135 138 134* 138  K 3.4* 3.1* 3.5 2.9* 3.2* 3.3*  CL 110 113* 113* 111 104 108  CO2 15* 16* 10* 22 24 25   GLUCOSE 182* 64* 165* 129* 302* 89  BUN 72* 66* 60* 33* 18 27*  CREATININE 7.74* 7.48* 7.31* 5.27* 3.84* 4.28*  CALCIUM 7.7* 7.5* 7.7* 7.7* 7.6* 7.8*  MG 2.5*  --  2.2  --   --   --   PHOS 6.9* 6.2* 6.1* 4.1 3.4 3.9   Liver Function Tests: Recent Labs  Lab 04/23/22 0543 04/24/22 0553 04/25/22 0517 04/26/22 0633 04/27/22 0704  ALBUMIN 2.0* 2.1* 2.1* 2.0* 2.0*   CBG: Recent Labs  Lab 04/26/22 0747 04/26/22 1129 04/26/22 1638 04/26/22 2102 04/27/22 0752  GLUCAP 287* 152* 150* 207* 100*    Discharge time spent: greater than 30 minutes.  Signed: Jennye Boroughs, MD [facility] 04/27/2022

## 2022-04-27 NOTE — Progress Notes (Signed)
   04/25/22 1405 04/25/22 1415 04/25/22 1430  Vitals  BP  --  (!) 167/103 (!) 158/104  MAP (mmHg)  --  122 120  Pulse Rate 92 92 92  ECG Heart Rate 93 93 92  Resp 10 15 (!) 9  During Treatment Monitoring  Blood Flow Rate (mL/min) 250 mL/min 250 mL/min 250 mL/min  Arterial Pressure (mmHg) -60 mmHg -70 mmHg -70 mmHg  Venous Pressure (mmHg) 70 mmHg 70 mmHg 70 mmHg  Transmembrane Pressure (mmHg) 40 mmHg 30 mmHg 30 mmHg  Ultrafiltration Rate (mL/min) 200 mL/min 200 mL/min 200 mL/min  Dialysate Flow Rate (mL/min) 300 ml/min 300 ml/min 300 ml/min  Conductivity 14 14 14   HD Safety Checks Performed Yes  --   --   Dialysis Fluid Bolus Normal Saline  --   --   Bolus Amount (mL) 250 mL  --   --   Intra-Hemodialysis Comments Tx initiated;Progressing as prescribed Progressing as prescribed Progressing as prescribed    04/25/22 1445 04/25/22 1500 04/25/22 1515  Vitals  BP (!) 158/102 (!) 157/107 (!) 153/104  MAP (mmHg) 117 123 119  Pulse Rate 93 96 93  ECG Heart Rate 93 97 94  Resp 17 16 14   During Treatment Monitoring  Blood Flow Rate (mL/min) 250 mL/min 250 mL/min 250 mL/min  Arterial Pressure (mmHg) -70 mmHg -70 mmHg -70 mmHg  Venous Pressure (mmHg) 70 mmHg 70 mmHg 70 mmHg  Transmembrane Pressure (mmHg) 30 mmHg 30 mmHg 30 mmHg  Ultrafiltration Rate (mL/min) 200 mL/min 200 mL/min 200 mL/min  Dialysate Flow Rate (mL/min) 300 ml/min 300 ml/min 300 ml/min  Conductivity 14 14 14   HD Safety Checks Performed  --   --   --   Dialysis Fluid Bolus  --   --   --   Bolus Amount (mL)  --   --   --   Intra-Hemodialysis Comments Progressing as prescribed Progressing as prescribed Progressing as prescribed

## 2022-05-22 DIAGNOSIS — N186 End stage renal disease: Secondary | ICD-10-CM | POA: Insufficient documentation

## 2022-05-25 ENCOUNTER — Encounter: Payer: Self-pay | Admitting: Surgery

## 2022-05-25 ENCOUNTER — Ambulatory Visit (INDEPENDENT_AMBULATORY_CARE_PROVIDER_SITE_OTHER): Payer: 59 | Admitting: Surgery

## 2022-05-25 VITALS — BP 117/72 | HR 103 | Temp 98.6°F | Ht 66.0 in | Wt 152.6 lb

## 2022-05-25 DIAGNOSIS — N186 End stage renal disease: Secondary | ICD-10-CM

## 2022-05-25 DIAGNOSIS — Z992 Dependence on renal dialysis: Secondary | ICD-10-CM | POA: Diagnosis not present

## 2022-05-25 DIAGNOSIS — K429 Umbilical hernia without obstruction or gangrene: Secondary | ICD-10-CM

## 2022-05-25 NOTE — Progress Notes (Signed)
05/25/2022  Reason for Visit:  End stage renal disease  Requesting Provider:  Anthonette Legato, MD  History of Present Illness: Emma Thomas is a 26 y.o. female presenting for evaluation for peritoneal dialysis catheter placement.  The patient has a history of worsening renal disease secondary to Type I diabetes, and had a right brachiocephalic AV fistula placed at [facility] on 03/19/22.  She was unfortunately admitted with worsening renal function on 04/19/22 and required immediate start of dialysis through a permacath placement, as her AVF was not matured yet.  Currently, the AVF is ready for use, but the patient reports it's been difficult to stick/access.  Her dialysis days are Tuesday, Thursday, and Saturday.  Her A1c is improving and last check was 8.0, down from 9.9 in April, down from 10.6 in March.  After further discussion with Dr. Holley Raring, she wants to explore the option for home dialysis to have more freedom during the daytime.  She denies any abdominal surgeries and denies any current abdominal pain, nausea, or vomiting.  She does have a temporary insulin pump.  Patient is left-handed.  Past Medical History: Past Medical History:  Diagnosis Date   Diabetes mellitus without complication ([place])    Type 1 DM   Essential hypertension    Hypertension 03/04/2013   Neurologic disorder    Both feet   Recurrent UTI    Renal disorder      Past Surgical History: Past Surgical History:  Procedure Laterality Date   abscess removal     excision of bartholin cyst   DIALYSIS/PERMA CATHETER INSERTION N/A 04/24/2022   Procedure: DIALYSIS/PERMA CATHETER INSERTION;  Surgeon: Katha Cabal, MD;  Location: [facility];  Service: Cardiovascular;  Laterality: N/A;   Nexplanon  01/2011    Home Medications: Prior to Admission medications   Medication Sig Start Date End Date Taking? Authorizing Provider  acetaminophen (TYLENOL) 325 MG tablet Take 2 tablets (650 mg total) by mouth every 6  (six) hours as needed for mild pain (or Fever >/= 101). 04/27/22   Jennye Boroughs, MD  amLODipine (NORVASC) 10 MG tablet Take 1 tablet (10 mg total) by mouth daily. 04/28/22   Jennye Boroughs, MD  atorvastatin (LIPITOR) 40 MG tablet Take 40 mg by mouth daily. 01/30/22   [provider]  ferrous gluconate (FERGON) 324 MG tablet Take by mouth. 05/03/20   [provider]  insulin glargine (LANTUS) 100 UNIT/ML injection Inject 14 Units into the skin 2 (two) times daily. 14u in morning and 12u at night    [provider]  insulin lispro (HUMALOG) 100 UNIT/ML injection Inject into the skin 3 (three) times daily before meals.    [provider]  JUNEL 1/20 1-20 MG-MCG tablet Take 1 tablet by mouth daily. 04/17/22   [provider]  montelukast (SINGULAIR) 10 MG tablet Take 10 mg by mouth daily. 04/19/22   [provider]  Multiple Vitamin (MULTIVITAMIN) capsule Take 1 capsule by mouth daily.    [provider]  ondansetron (ZOFRAN-ODT) 4 MG disintegrating tablet Take 4 mg by mouth every 8 (eight) hours as needed. 12/28/21   [provider]  sodium bicarbonate 650 MG tablet Take 1,300 mg by mouth 2 (two) times daily. 12/28/21   [provider]  SUMAtriptan (IMITREX) 50 MG tablet Take 50 mg by mouth daily as needed. 11/11/21   [provider]  Vitamin D, Ergocalciferol, (DRISDOL) 1.25 MG (50000 UNIT) CAPS capsule Take 50,000 Units by mouth once a  week. 02/05/22   [provider]    Allergies: No Known Allergies  Social History:  reports that she has never smoked. She has never used smokeless tobacco. She reports that she does not currently use alcohol after a past usage of about 2.0 standard drinks of alcohol per week. She reports that she does not use drugs.   Family History: Family History  Problem Relation Age of Onset   Breast cancer Mother 86   Lung cancer Maternal Grandmother    Diabetes type II Paternal  Grandmother     Review of Systems: Review of Systems  Constitutional:  Negative for chills and fever.  HENT:  Negative for hearing loss.   Respiratory:  Negative for shortness of breath.   Cardiovascular:  Negative for chest pain.  Gastrointestinal:  Negative for abdominal pain, nausea and vomiting.  Genitourinary:  Negative for dysuria.  Musculoskeletal:  Negative for myalgias.  Skin:  Negative for rash.  Neurological:  Negative for dizziness.  Psychiatric/Behavioral:  Negative for depression.     Physical Exam BP 117/72   Pulse (!) 103   Temp 98.6 F (37 C) (Oral)   Ht _0  (1.676 m)   Wt 152 lb 9.6 oz (69.2 kg)   LMP 04/12/2022   BMI 24.63 kg/m  CONSTITUTIONAL: No acute distress, well nourished. HEENT:  Normocephalic, atraumatic, extraocular motion intact. NECK: Trachea is midline, and there is no jugular venous distension.  RESPIRATORY:  Lungs are clear, and breath sounds are equal bilaterally. Normal respiratory effort without pathologic use of accessory muscles. CARDIOVASCULAR: Heart is regular without murmurs, gallops, or rubs. GI: The abdomen is soft, non-distended, non-tender to palpation.  The patient has a small < 1 cm umbilical hernia.  Insulin pump located on the left abdomen wall. MUSCULOSKELETAL:  Right brachiocephalic AV fistula, with very good palpable thrill, some ecchymosis from accessing.  Right IJ permacath in place. SKIN: Skin turgor is normal. There are no pathologic skin lesions.  NEUROLOGIC:  Motor and sensation is grossly normal.  Cranial nerves are grossly intact. PSYCH:  Alert and oriented to person, place and time. Affect is normal.  Laboratory Analysis: Labs from 05/22/22: Na 134, K 3.5, Cl 98, CO2 26, BUN 18, Cr 3.9.  Total bilirubin 0.8, AST 17, ALT 14, Alk Phos 50, Albumin 2.5.  Hg A1c 8.0  Imaging: CT scan abdomen/pelvis 04/19/22: IMPRESSION: There is no evidence of intestinal obstruction or pneumoperitoneum. There is no hydronephrosis.  Appendix is not dilated.   There is mild diffuse wall thickening in the small bowel loops and colon. This may be due to incomplete distention or suggest nonspecific enterocolitis. There is minimal stranding in the fat planes adjacent to small bowel loops. There is minimal ascites.   Subtle increased density is seen in the dependent portion of gallbladder lumen suggesting presence of sludge and possibly tiny stones. There are no signs of acute cholecystitis. There is no dilation of bile ducts.   Assessment and Plan: This is a 26 y.o. female with ESRD, currently on HD, seeking PD catheter placement.  --Discussed with the patient the concept of peritoneal dialysis and that this can be done at home, at her time convenience, and is portable.  She was asking also about the possibility of HD at home via her fistula, but I am not familiar with that.  At the end, the patient wanted to continue with PD catheter placement as she does not want to have to stick herself for fistula access. --Discussed with her  the planned surgery for a laparoscopic assisted peritoneal dialysis catheter placement, with repair of her umbilical hernia.  Reviewed with her the surgery at length, including incisions, risks of bleeding, infection, injury to surrounding structures, that we would repair her umbilical hernia at the same time, that this is an outpatient surgery, post-op activity restrictions, and she's willing to proceed. --Will schedule the patient for surgery on 06/09/22.  All of her questions have been answered.  I spent 80 minutes dedicated to the care of this patient on the date of this encounter to include pre-visit review of records, face-to-face time with the patient discussing diagnosis and management, and any post-visit coordination of care.   Melvyn Neth, [facility]

## 2022-05-25 NOTE — H&P (View-Only) (Signed)
05/25/2022  Reason for Visit:  End stage renal disease  Requesting Provider:  Anthonette Legato, MD  History of Present Illness: Emma Thomas is a 26 y.o. female presenting for evaluation for peritoneal dialysis catheter placement.  The patient has a history of worsening renal disease secondary to Type I diabetes, and had a right brachiocephalic AV fistula placed at [facility] on 03/19/22.  She was unfortunately admitted with worsening renal function on 04/19/22 and required immediate start of dialysis through a permacath placement, as her AVF was not matured yet.  Currently, the AVF is ready for use, but the patient reports it's been difficult to stick/access.  Her dialysis days are Tuesday, Thursday, and Saturday.  Her A1c is improving and last check was 8.0, down from 9.9 in April, down from 10.6 in March.  After further discussion with Dr. Holley Raring, she wants to explore the option for home dialysis to have more freedom during the daytime.  She denies any abdominal surgeries and denies any current abdominal pain, nausea, or vomiting.  She does have a temporary insulin pump.  Patient is left-handed.  Past Medical History: Past Medical History:  Diagnosis Date   Diabetes mellitus without complication ([place])    Type 1 DM   Essential hypertension    Hypertension 03/04/2013   Neurologic disorder    Both feet   Recurrent UTI    Renal disorder      Past Surgical History: Past Surgical History:  Procedure Laterality Date   abscess removal     excision of bartholin cyst   DIALYSIS/PERMA CATHETER INSERTION N/A 04/24/2022   Procedure: DIALYSIS/PERMA CATHETER INSERTION;  Surgeon: Katha Cabal, MD;  Location: [place] [facility];  Service: Cardiovascular;  Laterality: N/A;   Nexplanon  01/2011    Home Medications: Prior to Admission medications   Medication Sig Start Date End Date Taking? Authorizing Provider  acetaminophen (TYLENOL) 325 MG tablet Take 2 tablets (650 mg total) by mouth every 6  (six) hours as needed for mild pain (or Fever >/= 101). 04/27/22   Jennye Boroughs, MD  amLODipine (NORVASC) 10 MG tablet Take 1 tablet (10 mg total) by mouth daily. 04/28/22   Jennye Boroughs, MD  atorvastatin (LIPITOR) 40 MG tablet Take 40 mg by mouth daily. 01/30/22   [provider]  ferrous gluconate (FERGON) 324 MG tablet Take by mouth. 05/03/20   [provider]  insulin glargine (LANTUS) 100 UNIT/ML injection Inject 14 Units into the skin 2 (two) times daily. 14u in morning and 12u at night    [provider]  insulin lispro (HUMALOG) 100 UNIT/ML injection Inject into the skin 3 (three) times daily before meals.    [provider]  JUNEL 1/20 1-20 MG-MCG tablet Take 1 tablet by mouth daily. 04/17/22   [provider]  montelukast (SINGULAIR) 10 MG tablet Take 10 mg by mouth daily. 04/19/22   [provider]  Multiple Vitamin (MULTIVITAMIN) capsule Take 1 capsule by mouth daily.    [provider]  ondansetron (ZOFRAN-ODT) 4 MG disintegrating tablet Take 4 mg by mouth every 8 (eight) hours as needed. 12/28/21   [provider]  sodium bicarbonate 650 MG tablet Take 1,300 mg by mouth 2 (two) times daily. 12/28/21   [provider]  SUMAtriptan (IMITREX) 50 MG tablet Take 50 mg by mouth daily as needed. 11/11/21   [provider]  Vitamin D, Ergocalciferol, (DRISDOL) 1.25 MG (50000 UNIT) CAPS capsule Take 50,000 Units by mouth once a  week. 02/05/22   [provider]    Allergies: No Known Allergies  Social History:  reports that she has never smoked. She has never used smokeless tobacco. She reports that she does not currently use alcohol after a past usage of about 2.0 standard drinks of alcohol per week. She reports that she does not use drugs.   Family History: Family History  Problem Relation Age of Onset   Breast cancer Mother 86   Lung cancer Maternal Grandmother    Diabetes type II Paternal  Grandmother     Review of Systems: Review of Systems  Constitutional:  Negative for chills and fever.  HENT:  Negative for hearing loss.   Respiratory:  Negative for shortness of breath.   Cardiovascular:  Negative for chest pain.  Gastrointestinal:  Negative for abdominal pain, nausea and vomiting.  Genitourinary:  Negative for dysuria.  Musculoskeletal:  Negative for myalgias.  Skin:  Negative for rash.  Neurological:  Negative for dizziness.  Psychiatric/Behavioral:  Negative for depression.     Physical Exam BP 117/72   Pulse (!) 103   Temp 98.6 F (37 C) (Oral)   Ht _0  (1.676 m)   Wt 152 lb 9.6 oz (69.2 kg)   LMP 04/12/2022   BMI 24.63 kg/m  CONSTITUTIONAL: No acute distress, well nourished. HEENT:  Normocephalic, atraumatic, extraocular motion intact. NECK: Trachea is midline, and there is no jugular venous distension.  RESPIRATORY:  Lungs are clear, and breath sounds are equal bilaterally. Normal respiratory effort without pathologic use of accessory muscles. CARDIOVASCULAR: Heart is regular without murmurs, gallops, or rubs. GI: The abdomen is soft, non-distended, non-tender to palpation.  The patient has a small < 1 cm umbilical hernia.  Insulin pump located on the left abdomen wall. MUSCULOSKELETAL:  Right brachiocephalic AV fistula, with very good palpable thrill, some ecchymosis from accessing.  Right IJ permacath in place. SKIN: Skin turgor is normal. There are no pathologic skin lesions.  NEUROLOGIC:  Motor and sensation is grossly normal.  Cranial nerves are grossly intact. PSYCH:  Alert and oriented to person, place and time. Affect is normal.  Laboratory Analysis: Labs from 05/22/22: Na 134, K 3.5, Cl 98, CO2 26, BUN 18, Cr 3.9.  Total bilirubin 0.8, AST 17, ALT 14, Alk Phos 50, Albumin 2.5.  Hg A1c 8.0  Imaging: CT scan abdomen/pelvis 04/19/22: IMPRESSION: There is no evidence of intestinal obstruction or pneumoperitoneum. There is no hydronephrosis.  Appendix is not dilated.   There is mild diffuse wall thickening in the small bowel loops and colon. This may be due to incomplete distention or suggest nonspecific enterocolitis. There is minimal stranding in the fat planes adjacent to small bowel loops. There is minimal ascites.   Subtle increased density is seen in the dependent portion of gallbladder lumen suggesting presence of sludge and possibly tiny stones. There are no signs of acute cholecystitis. There is no dilation of bile ducts.   Assessment and Plan: This is a 26 y.o. female with ESRD, currently on HD, seeking PD catheter placement.  --Discussed with the patient the concept of peritoneal dialysis and that this can be done at home, at her time convenience, and is portable.  She was asking also about the possibility of HD at home via her fistula, but I am not familiar with that.  At the end, the patient wanted to continue with PD catheter placement as she does not want to have to stick herself for fistula access. --Discussed with her  the planned surgery for a laparoscopic assisted peritoneal dialysis catheter placement, with repair of her umbilical hernia.  Reviewed with her the surgery at length, including incisions, risks of bleeding, infection, injury to surrounding structures, that we would repair her umbilical hernia at the same time, that this is an outpatient surgery, post-op activity restrictions, and she's willing to proceed. --Will schedule the patient for surgery on 06/09/22.  All of her questions have been answered.  I spent 80 minutes dedicated to the care of this patient on the date of this encounter to include pre-visit review of records, face-to-face time with the patient discussing diagnosis and management, and any post-visit coordination of care.   Melvyn Neth, [facility]

## 2022-05-25 NOTE — Patient Instructions (Signed)
Our surgery scheduler Barbara will call you within 24-48 hours to get you scheduled. If you have not heard from her after 48 hours, please call our office. Have the blue sheet available when she calls to write down important information.  If you have any concerns or questions, please feel free to call our office.   Peritoneal Dialysis Catheter Placement  Peritoneal dialysis catheter placement is a surgery to insert a thin, flexible tube (catheter) into the abdomen. The catheter will be used for peritoneal dialysis, which is a process for filtering the blood. The catheter is small, soft, and easy to conceal. The catheter placement is usually done at least 2 weeks before peritoneal dialysis is started. During dialysis, wastes, salt, and extra water are removed from the blood. In peritoneal dialysis, these tasks are performed by transferring a fluid (dialysate) to and from the abdomen during each session. The fluid goes through the catheter to enter the abdomen at the start of each dialysis session, and it drains out of the body through the catheter at the end of each session. This procedure is done using one of the following techniques: Open technique. This is when the surgery is performed through one large incision. Laparoscopic technique. This is when smaller incisions are made and a tube with a light and camera (laparoscope) is inserted through one of the incisions to help perform the surgery. The camera sends images to a video screen in the operating room. This lets the surgeon see inside the abdomen during the procedure. Tell a health care provider about: Any allergies you have. All medicines you are taking, including vitamins, herbs, eye drops, creams, and over-the-counter medicines. Any problems you or family members have had with anesthetic medicines. Any blood disorders you have. Any surgeries you have had. Any history of smoking. Any medical conditions you have. Whether you are pregnant or  may be pregnant. What are the risks? Generally, this is a safe procedure. However, problems may occur, including: Infection. Too much bleeding. A collection of blood near the incision (hematoma). Damage to blood vessels, tissues, or organs in the abdomen area. Allergic reactions to medicines. Pain or cramping. Slow healing. Catheter problems after the surgery. The catheter may: Become blocked. Move out of place. Poke or wrap around intestines. Allow fluid to leak around it. Scarring. Skin damage. What happens before the procedure? Staying hydrated Follow instructions from your health care provider about hydration, which may include: Up to 2 hours before the procedure - you may continue to drink clear liquids, such as water, clear fruit juice, black coffee, and plain tea.  Eating and drinking restrictions Follow instructions from your health care provider about eating and drinking, which may include: 8 hours before the procedure - stop eating heavy meals or foods, such as meat, fried foods, or fatty foods. 6 hours before the procedure - stop eating light meals or foods, such as toast or cereal. 6 hours before the procedure - stop drinking milk or drinks that contain milk. 2 hours before the procedure - stop drinking clear liquids. Medicines Ask your health care provider about: Changing or stopping your regular medicines. This is especially important if you are taking diabetes medicines or blood thinners. Taking medicines such as aspirin and ibuprofen. These medicines can thin your blood. Do not take these medicines unless your health care provider tells you to take them. Taking over-the-counter medicines, vitamins, herbs, and supplements. Surgery safety Ask your health care provider: How your surgery site will be marked. What   steps will be taken to help prevent infection. These steps may include: Removing hair at the surgery site. Washing skin with a germ-killing soap. Taking  antibiotic medicine. General instructions You may have a CT scan or ultrasound of your abdomen. You may have a blood sample taken. Plan to have a responsible adult take you home from the hospital or clinic. Your health care provider will discuss the best site for the catheter to be placed. The site will be chosen: To help prevent the catheter from being flattened or damaged. To make it as comfortable as possible for you. What happens during the procedure? An IV will be inserted into one of your veins. You will be given one or both of the following: A medicine to help you relax (sedative). A medicine to numb the area (local anesthetic). If you are having an open surgery, one large incision will be made in the abdomen. If you are having laparoscopic surgery, small incisions will be made in the abdomen. A laparoscope and instruments will be put through the incisions. The catheter will be put in place. A short tunnel will be made under the skin to a location where the catheter exits the abdomen. Stitches (sutures) will be placed around the catheter to hold it in place. Your incisions will be closed with sutures or staples. The procedure may vary among health care providers and hospitals. What happens after the procedure? Your blood pressure, heart rate, breathing rate, and blood oxygen level will be monitored until you leave the hospital or clinic. You may have some pain. You will be given pain medicine as needed. You will be given instructions about how to care for your catheter and how it is used for the dialysis process. Summary Peritoneal dialysis catheter placement is a surgery to insert a thin, flexible tube (catheter) in your abdomen. This surgery must be done before you begin peritoneal dialysis. Before the procedure, your health care provider will discuss the best site for the catheter to be placed. The site will be chosen to help prevent the catheter from being flattened or damaged,  and to make it as comfortable as possible for you. After the procedure, you will be given instructions about how to care for your catheter and how it is used for the dialysis process. This information is not intended to replace advice given to you by your health care provider. Make sure you discuss any questions you have with your health care provider. Document Revised: 06/20/2020 Document Reviewed: 06/20/2020 Elsevier Patient Education  2023 [employer]vier Inc[employer]  

## 2022-05-26 ENCOUNTER — Telehealth: Payer: Self-pay | Admitting: Surgery

## 2022-05-26 NOTE — Telephone Encounter (Signed)
Outgoing call is made, left message for patient to call.  Please inform her of the following regarding scheduled surgery:   Pre-Admission date/time, and Surgery date.  Surgery Date: 06/09/22 Preadmission Testing Date: 05/29/22 (phone 8a-1p)  Also, patient will need to call at [phone], between 1-3:00pm the day before surgery, to find out what time to arrive for surgery.

## 2022-05-27 NOTE — Telephone Encounter (Signed)
Called patient back, she is now informed of all dates regarding her surgery and verbalized understanding.

## 2022-05-29 ENCOUNTER — Inpatient Hospital Stay: Admission: RE | Admit: 2022-05-29 | Payer: 59 | Source: Ambulatory Visit

## 2022-06-02 ENCOUNTER — Encounter
Admission: EM | Disposition: A | Discharge status: Home / Self Care | Payer: Self-pay | Source: Home / Self Care | Attending: Emergency Medicine

## 2022-06-02 ENCOUNTER — Emergency Department
Admission: EM | Admit: 2022-06-02 | Discharge: 2022-06-02 | Disposition: A | Discharge status: Home / Self Care | Payer: Commercial Managed Care - PPO | Attending: Emergency Medicine | Admitting: Emergency Medicine

## 2022-06-02 ENCOUNTER — Other Ambulatory Visit: Payer: Self-pay

## 2022-06-02 DIAGNOSIS — X58XXXA Exposure to other specified factors, initial encounter: Secondary | ICD-10-CM | POA: Diagnosis not present

## 2022-06-02 DIAGNOSIS — T82898A Other specified complication of vascular prosthetic devices, implants and grafts, initial encounter: Secondary | ICD-10-CM

## 2022-06-02 DIAGNOSIS — E119 Type 2 diabetes mellitus without complications: Secondary | ICD-10-CM

## 2022-06-02 DIAGNOSIS — I120 Hypertensive chronic kidney disease with stage 5 chronic kidney disease or end stage renal disease: Secondary | ICD-10-CM | POA: Insufficient documentation

## 2022-06-02 DIAGNOSIS — N186 End stage renal disease: Secondary | ICD-10-CM

## 2022-06-02 DIAGNOSIS — T829XXA Unspecified complication of cardiac and vascular prosthetic device, implant and graft, initial encounter: Secondary | ICD-10-CM | POA: Diagnosis not present

## 2022-06-02 DIAGNOSIS — E1022 Type 1 diabetes mellitus with diabetic chronic kidney disease: Secondary | ICD-10-CM | POA: Insufficient documentation

## 2022-06-02 DIAGNOSIS — Z452 Encounter for adjustment and management of vascular access device: Secondary | ICD-10-CM

## 2022-06-02 DIAGNOSIS — Z794 Long term (current) use of insulin: Secondary | ICD-10-CM | POA: Diagnosis not present

## 2022-06-02 DIAGNOSIS — Z992 Dependence on renal dialysis: Secondary | ICD-10-CM | POA: Diagnosis not present

## 2022-06-02 DIAGNOSIS — I10 Essential (primary) hypertension: Secondary | ICD-10-CM

## 2022-06-02 DIAGNOSIS — R0789 Other chest pain: Secondary | ICD-10-CM | POA: Diagnosis present

## 2022-06-02 HISTORY — PX: DIALYSIS/PERMA CATHETER REMOVAL: CATH118289

## 2022-06-02 LAB — CBC WITH DIFFERENTIAL/PLATELET
Abs Immature Granulocytes: 0.01 10*3/uL (ref 0.00–0.07)
Basophils Absolute: 0.1 10*3/uL (ref 0.0–0.1)
Basophils Relative: 1 %
Eosinophils Absolute: 0.2 10*3/uL (ref 0.0–0.5)
Eosinophils Relative: 2 %
HCT: 27.8 % — ABNORMAL LOW (ref 36.0–46.0)
Hemoglobin: 8.9 g/dL — ABNORMAL LOW (ref 12.0–15.0)
Immature Granulocytes: 0 %
Lymphocytes Relative: 23 %
Lymphs Abs: 1.6 10*3/uL (ref 0.7–4.0)
MCH: 28.4 pg (ref 26.0–34.0)
MCHC: 32 g/dL (ref 30.0–36.0)
MCV: 88.8 fL (ref 80.0–100.0)
Monocytes Absolute: 0.3 10*3/uL (ref 0.1–1.0)
Monocytes Relative: 4 %
Neutro Abs: 5 10*3/uL (ref 1.7–7.7)
Neutrophils Relative %: 70 %
Platelets: 351 10*3/uL (ref 150–400)
RBC: 3.13 MIL/uL — ABNORMAL LOW (ref 3.87–5.11)
RDW: 12.4 % (ref 11.5–15.5)
WBC: 7.2 10*3/uL (ref 4.0–10.5)
nRBC: 0 % (ref 0.0–0.2)

## 2022-06-02 LAB — COMPREHENSIVE METABOLIC PANEL
ALT: 16 U/L (ref 0–44)
AST: 20 U/L (ref 15–41)
Albumin: 2.9 g/dL — ABNORMAL LOW (ref 3.5–5.0)
Alkaline Phosphatase: 58 U/L (ref 38–126)
Anion gap: 12 (ref 5–15)
BUN: 28 mg/dL — ABNORMAL HIGH (ref 6–20)
CO2: 21 mmol/L — ABNORMAL LOW (ref 22–32)
Calcium: 8.4 mg/dL — ABNORMAL LOW (ref 8.9–10.3)
Chloride: 109 mmol/L (ref 98–111)
Creatinine, Ser: 5.25 mg/dL — ABNORMAL HIGH (ref 0.44–1.00)
GFR, Estimated: 11 mL/min — ABNORMAL LOW (ref >60)
Glucose, Bld: 124 mg/dL — ABNORMAL HIGH (ref 70–99)
Potassium: 4 mmol/L (ref 3.5–5.1)
Sodium: 142 mmol/L (ref 135–145)
Total Bilirubin: 0.7 mg/dL (ref 0.3–1.2)
Total Protein: 7.4 g/dL (ref 6.5–8.1)

## 2022-06-02 LAB — LACTIC ACID, PLASMA: Lactic Acid, Venous: 0.7 mmol/L (ref 0.5–1.9)

## 2022-06-02 SURGERY — DIALYSIS/PERMA CATHETER REMOVAL
Anesthesia: LOCAL

## 2022-06-02 SURGICAL SUPPLY — 2 items
FORCEPS HALSTEAD CVD 5IN STRL (INSTRUMENTS) ×1 IMPLANT
TRAY LACERAT/PLASTIC (MISCELLANEOUS) ×1 IMPLANT

## 2022-06-02 NOTE — ED Notes (Signed)
During D/C pt refused d/c vitals. Pt sts " I just need my d/c papers and go, I really dont even need them."

## 2022-06-02 NOTE — Op Note (Signed)
  OPERATIVE NOTE   PROCEDURE: Removal of a right IJ tunneled dialysis catheter  PRE-OPERATIVE DIAGNOSIS: Complication of dialysis catheter  POST-OPERATIVE DIAGNOSIS: Same  SURGEON: Hortencia Pilar  ANESTHESIA: Local anesthetic with 1% lidocaine with epinephrine   ESTIMATED BLOOD LOSS: Minimal   FINDING(S): 1. Catheter intact   SPECIMEN(S):  Catheter  INDICATIONS:   Emma Thomas is a 26 y.o. female who presents with a tender nonfunctioning right IJ tunneled dialysis catheter.  She has a right upper arm fistula which they have been using for weeks without difficulty.  Removal of the tunneled catheter to prevent septic complications is recommended.  Risk and benefits are reviewed all questions were answered patient has agreed to proceed  DESCRIPTION: After obtaining full informed written consent, the patient was positioned supine. The right IJ catheter and surrounding area is prepped and draped in a sterile fashion. The cuff was localized by palpation and noted to be greater than 3 cm from the exit site. After appropriate timeout is called, 1% lidocaine with epinephrine is infiltrated into the surrounding tissues around the cuff. Small transverse incision is created with an 11 blade scalpel and the dissection was carried down to expose the cuff of the tunneled catheter.  The catheter is then freed from the surrounding attachments and adhesions. Once the catheter has been freed circumferentially it is transected just distal to the cuff and subsequently removed in 2 pieces. Light pressure was held at the base of the neck. A 4-0 Monocryl was used close the tunnel in the subcutaneous space. The 4-0 Monocryl Monocryl was then used to close the skin in a subcuticular stitch. Dermabond is applied.  Antibiotic ointment and a sterile dressing is applied to the exit site. Patient tolerated procedure well and there were no complications.  COMPLICATIONS: None  CONDITION: Unchanged  Hortencia Pilar. [facility] Office: [phone]  06/02/2022,5:15 PM

## 2022-06-02 NOTE — Consult Note (Signed)
@LOGO @   MRN : [id number]  Emma Thomas is a 26 y.o. (January 08, 1996) female who presents with chief complaint of check access.  History of Present Illness:   I am asked to evaluate the patient by Dr. Charna Archer.  Patient is a 26 year old woman who underwent placement of a tunneled dialysis catheter on April 24, 2022.  She came to the ER today because her catheter site and catheter are extremely tender.  She is noting that there has been some drainage.  She denies fever chills.  The patient reports the function of the right arm AV fistula has been stable. Patient denies difficulty with cannulation. The patient denies increased bleeding time after removing the needles. The patient denies hand pain or other symptoms consistent with steal phenomena.  No significant arm swelling.  The patient denies any complaints from the dialysis center or their nephrologist.  The patient denies redness or swelling at the arm access site. The patient denies fever or chills at home or while on dialysis.  No recent shortening of the patient's walking distance or new symptoms consistent with claudication.  No history of rest pain symptoms. No new ulcers or wounds of the lower extremities have occurred.  There is no history of DVT, PE or superficial thrombophlebitis.  White count is normal at 7.2 blood cultures are pending lactate is normal also at 0.7  No outpatient medications have been marked as taking for the 06/02/22 encounter [facility] Encounter).    Past Medical History:  Diagnosis Date   Diabetes mellitus without complication ([place])    Type 1 DM   Essential hypertension    Hypertension 03/04/2013   Neurologic disorder    Both feet   Recurrent UTI    Renal disorder     Past Surgical History:  Procedure Laterality Date   abscess removal     excision of bartholin cyst   DIALYSIS/PERMA CATHETER INSERTION N/A 04/24/2022   Procedure: DIALYSIS/PERMA CATHETER INSERTION;  Surgeon: Katha Cabal, MD;  Location: [place] [facility];  Service: Cardiovascular;  Laterality: N/A;   Nexplanon  01/2011    Social History Social History   Tobacco Use   Smoking status: Never   Smokeless tobacco: Never  Vaping Use   Vaping Use: Never used  Substance Use Topics   Alcohol use: Not Currently    Alcohol/week: 2.0 standard drinks of alcohol    Types: 2 Shots of liquor per week   Drug use: No    Family History Family History  Problem Relation Age of Onset   Breast cancer Mother 66   Lung cancer Maternal Grandmother    Diabetes type II Paternal Grandmother     No Known Allergies   REVIEW OF SYSTEMS (Negative unless checked)  Constitutional: [] Weight loss  [] Fever  [] Chills Cardiac: [] Chest pain   [] Chest pressure   [] Palpitations   [] Shortness of breath when laying flat   [] Shortness of breath with exertion. Vascular:  [] Pain in legs with walking   [] Pain in legs at rest  [] History of DVT   [] Phlebitis   [] Swelling in legs   [] Varicose veins   [] Non-healing ulcers Pulmonary:   [] Uses home oxygen   [] Productive cough   [] Hemoptysis   [] Wheeze  [] COPD   [] Asthma Neurologic:  [] Dizziness   [] Seizures   [] History of stroke   [] History of TIA  [] Aphasia   [] Vissual changes   [] Weakness or numbness in arm   []   Weakness or numbness in leg Musculoskeletal:   [] Joint swelling   [] Joint pain   [] Low back pain Hematologic:  [] Easy bruising  [] Easy bleeding   [] Hypercoagulable state   [] Anemic Gastrointestinal:  [] Diarrhea   [] Vomiting  [] Gastroesophageal reflux/heartburn   [] Difficulty swallowing. Genitourinary:  [x] Chronic kidney disease   [] Difficult urination  [] Frequent urination   [] Blood in urine Skin:  [] Rashes   [] Ulcers  Psychological:  [] History of anxiety   []  History of major depression.  Physical Examination  Vitals:   06/02/22 1153 06/02/22 1159  BP:  (!) 171/107  Pulse:  93  Resp:  20  Temp:  98.5 F (36.9 C)  TempSrc:  Oral  SpO2:  100%  Weight: 63.5 kg    Height: 5\' 6"  (1.676 m)    Body mass index is 22.6 kg/m. Gen: WD/WN, NAD Head: [place]/AT, No temporalis wasting.  Ear/Nose/Throat: Hearing grossly intact, nares w/o erythema or drainage Eyes: PER, EOMI, sclera nonicteric.  Neck: Supple, no gross masses or lesions.  No JVD.  Pulmonary:  Good air movement, no audible wheezing, no use of accessory muscles.  Cardiac: RRR, precordium non-hyperdynamic. Vascular:   Right brachiocephalic fistula good thrill good bruit; right IJ tunneled catheter tender to palpation Vessel Right Left  Radial Palpable Palpable  Brachial Palpable Palpable  Gastrointestinal: soft, non-distended. No guarding/no peritoneal signs.  Musculoskeletal: M/S 5/5 throughout.  No deformity.  Neurologic: CN 2-12 intact. Pain and light touch intact in extremities.  Symmetrical.  Speech is fluent. Motor exam as listed above. Psychiatric: Judgment intact, Mood & affect appropriate for pt's clinical situation. Dermatologic: No rashes or ulcers noted.  No changes consistent with cellulitis.   CBC Lab Results  Component Value Date   WBC 7.2 06/02/2022   HGB 8.9 (L) 06/02/2022   HCT 27.8 (L) 06/02/2022   MCV 88.8 06/02/2022   PLT 351 06/02/2022    BMET    Component Value Date/Time   NA 142 06/02/2022 1205   NA 136 10/25/2019 1022   K 4.0 06/02/2022 1205   CL 109 06/02/2022 1205   CO2 21 (L) 06/02/2022 1205   GLUCOSE 124 (H) 06/02/2022 1205   BUN 28 (H) 06/02/2022 1205   BUN 21 (H) 10/25/2019 1022   CREATININE 5.25 (H) 06/02/2022 1205   CALCIUM 8.4 (L) 06/02/2022 1205   GFRNONAA 11 (L) 06/02/2022 1205   GFRAA 55 (L) 02/13/2020 1439   Estimated Creatinine Clearance: 15.1 mL/min (A) (by C-G formula based on SCr of 5.25 mg/dL (H)).  COAG Lab Results  Component Value Date   INR 1.16 08/16/2017    Radiology No results found.   Assessment/Plan End-stage renal disease requiring hemodialysis: The patient now has a functioning fistula and is no longer using the  tunnel catheter.  This is also hurting her.  It represents a potential for sepsis and life-threatening complications and therefore the tunnel catheter should be removed.  The risks and benefits of been reviewed all questions were answered patient agrees to proceed.  With respect to her fistula she should follow-up in the office in 2 to 3 weeks and we will begin surveillance of her hemodialysis access.  She has expressed that she has been planning to convert to peritoneal dialysis.  Hypertension: Continue antihypertensive medications as already ordered, these medications have been reviewed and there are no changes at this time.  Diabetes mellitus: Continue hypoglycemic medications as already ordered, these medications have been reviewed and there are no changes at this time.  Hgb A1C to  be monitored as already arranged by primary service    Hortencia Pilar, MD  06/02/2022 4:02 PM

## 2022-06-02 NOTE — ED Triage Notes (Signed)
  Pt to ED to have chest dialysis access removed because she believes it is infected. States catheter was sore last week, then this past weekend area became painful to touch and red and with "crust around it". Pt states purulent drainage coming from around site since yesterday Area appears reddened around catheter. States at night time, it is painful to move R arm. Not at this time/during the day.  Pt has working dialysis fistula on R arm that has been used a few times. Pt gets [facility] Sat dialysis, last session was Saturday.   Pt went to dialysis center today, was told to come to ER to have chest catheter removed and to return for dialysis tomorrow. Was supposed to have dialysis today.  Pt states cannot stay overnight at hospital because has small child at home.  Spoke with EDP, who said to get blood cultures. First set drawn and sent with triage labs.

## 2022-06-02 NOTE — ED Provider Notes (Signed)
Emergency department handoff note  Care of this patient was signed out to me at the end of the previous provider shift.  All pertinent patient information was conveyed and all questions were answered.  Patient pending removal of her tunneled right IJ catheter.  This catheter was removed by vascular at bedside without complication. The patient has been reexamined and is ready to be discharged.  All diagnostic results have been reviewed and discussed with the patient/family.  Care plan has been outlined and the patient/family understands all current diagnoses, results, and treatment plans.  There are no new complaints, changes, or physical findings at this time.  All questions have been addressed and answered.  Patient was instructed to, and agrees to follow-up with their primary care physician as well as return to the emergency department if any new or worsening symptoms develop.   Naaman Plummer, MD 06/02/22 1650

## 2022-06-02 NOTE — Interval H&P Note (Signed)
History and Physical Interval Note:  06/02/2022 5:16 PM  Emma Thomas  has presented today for surgery, with the diagnosis of ESRD.  The various methods of treatment have been discussed with the patient and family. After consideration of risks, benefits and other options for treatment, the patient has consented to  Procedure(s): DIALYSIS/PERMA CATHETER REMOVAL (N/A) as a surgical intervention.  The patient's history has been reviewed, patient examined, no change in status, stable for surgery.  I have reviewed the patient's chart and labs.  Questions were answered to the patient's satisfaction.     Hortencia Pilar

## 2022-06-02 NOTE — ED Provider Notes (Signed)
[facility] Provider Note    Event Date/Time   First MD Initiated Contact with Patient 06/02/22 1218     (approximate)   History   Chief Complaint Vascular Access Problem   HPI  ANACRISTINA STEFFEK is a 26 y.o. female with past medical history of hypertension, diabetes, and ESRD on HD (TTS) who presents to the ED complaining of chest wall pain.  Patient reports that over the past 3 to 4 days she has had increasing pain around her right IJ tunneled dialysis catheter.  She states that the area has seemed red and sore to touch, with pain extending towards her right shoulder.  She states that the pain is worse when she moves her right arm, but she denies any trauma to the area.  She states the catheter is no longer being used as she has a fistula in her right arm through which she receives dialysis.  Over the past 24 hours, she states she has noticed a small amount of pus draining from the catheter site.  She last received dialysis 3 days ago through her right arm fistula without difficulty.  She presented to her dialysis center today and was referred to the ED due to concern for infection.  She has not yet received dialysis today.     Physical Exam   Triage Vital Signs: ED Triage Vitals  Enc Vitals Group     BP 06/02/22 1159 (!) 171/107     Pulse Rate 06/02/22 1159 93     Resp 06/02/22 1159 20     Temp 06/02/22 1159 98.5 F (36.9 C)     Temp Source 06/02/22 1159 Oral     SpO2 06/02/22 1159 100 %     Weight 06/02/22 1153 140 lb (63.5 kg)     Height 06/02/22 1153 5\' 6"  (1.676 m)     Head Circumference --      Peak Flow --      Pain Score 06/02/22 1152 8     Pain Loc --      Pain Edu? --      Excl. in [place]? --     Most recent vital signs: Vitals:   06/02/22 1159  BP: (!) 171/107  Pulse: 93  Resp: 20  Temp: 98.5 F (36.9 C)  SpO2: 100%    Constitutional: Alert and oriented. Eyes: Conjunctivae are normal. Head: Atraumatic. Nose: No  congestion/rhinnorhea. Mouth/Throat: Mucous membranes are moist.  Cardiovascular: Normal rate, regular rhythm. Grossly normal heart sounds.  2+ radial pulses bilaterally.  Right upper extremity AV fistula with palpable thrill. Respiratory: Normal respiratory effort.  No retractions. Lungs CTAB.  Right IJ TDC site with approximately 1 cm of surrounding erythema and mild tenderness.  No warmth, fluctuance, or purulent drainage appreciated. Gastrointestinal: Soft and nontender. No distention. Musculoskeletal: No lower extremity tenderness nor edema.  Able to range right arm with minimal pain. Neurologic:  Normal speech and language. No gross focal neurologic deficits are appreciated.    ED Results / Procedures / Treatments   Labs (all labs ordered are listed, but only abnormal results are displayed) Labs Reviewed  COMPREHENSIVE METABOLIC PANEL - Abnormal; Notable for the following components:      Result Value   CO2 21 (*)    Glucose, Bld 124 (*)    BUN 28 (*)    Creatinine, Ser 5.25 (*)    Calcium 8.4 (*)    Albumin 2.9 (*)    GFR, Estimated 11 (*)  All other components within normal limits  CBC WITH DIFFERENTIAL/PLATELET - Abnormal; Notable for the following components:   RBC 3.13 (*)    Hemoglobin 8.9 (*)    HCT 27.8 (*)    All other components within normal limits  CULTURE, BLOOD (ROUTINE X 2)  CULTURE, BLOOD (ROUTINE X 2)  LACTIC ACID, PLASMA  LACTIC ACID, PLASMA  URINALYSIS, ROUTINE W REFLEX MICROSCOPIC  POC URINE PREG, ED    PROCEDURES:  Critical Care performed: No  Procedures   MEDICATIONS ORDERED IN ED: Medications - No data to display   IMPRESSION / MDM / [place] / ED COURSE  I reviewed the triage vital signs and the nursing notes.                              26 y.o. female with past medical history of hypertension, type 1 diabetes, and ESRD on HD (TTS) who presents to the ED complaining of pain around her right IJ TDC site worsening over  the past 3 to 4 days with small amount of drainage.  Patient's presentation is most consistent with acute presentation with potential threat to life or bodily function.  Differential diagnosis includes, but is not limited to, sepsis, catheter site infection, cellulitis, abscess, electrolyte abnormality.  Patient well-appearing and in no acute distress, vital signs remarkable for elevated blood pressure but otherwise reassuring with no findings concerning for sepsis.  She has very mild erythema around the [facility] site with no evidence of abscess clinically and I am not able to express any purulence.  Blood cultures were drawn but labs are reassuring with normal lactic acid and no significant leukocytosis, chronic anemia is stable.  Renal function consistent with her known ESRD with no acute electrolyte abnormality.  Plan to discuss catheter removal with nephrology given it is no longer being used, but do not feel admission for IV antibiotics is indicated at this time.  Case discussed with Dr. Holley Raring from nephrology, who recommends reaching out to vascular surgery to potentially have the catheter pulled.  I reached out to Dr. Delana Meyer, who is currently in a procedure but will call back as soon as he is able.  Patient turned over to oncoming provider pending evaluation by vascular surgery.      FINAL CLINICAL IMPRESSION(S) / ED DIAGNOSES   Final diagnoses:  ESRD on dialysis ([place])  Complication associated with dialysis catheter     Rx / DC Orders   ED Discharge Orders     None        Note:  This document was prepared using Dragon voice recognition software and may include unintentional dictation errors.   Blake Divine, MD 06/02/22 1539

## 2022-06-02 NOTE — H&P (View-Only) (Signed)
@LOGO @   MRN : [id number]  Emma Thomas is a 26 y.o. ([birth year]) female who presents with chief complaint of check access.  History of Present Illness:   I am asked to evaluate the patient by Dr. Charna Archer.  Patient is a 26 year old woman who underwent placement of a tunneled dialysis catheter on April 24, 2022.  She came to the ER today because her catheter site and catheter are extremely tender.  She is noting that there has been some drainage.  She denies fever chills.  The patient reports the function of the right arm AV fistula has been stable. Patient denies difficulty with cannulation. The patient denies increased bleeding time after removing the needles. The patient denies hand pain or other symptoms consistent with steal phenomena.  No significant arm swelling.  The patient denies any complaints from the dialysis center or their nephrologist.  The patient denies redness or swelling at the arm access site. The patient denies fever or chills at home or while on dialysis.  No recent shortening of the patient's walking distance or new symptoms consistent with claudication.  No history of rest pain symptoms. No new ulcers or wounds of the lower extremities have occurred.  There is no history of DVT, PE or superficial thrombophlebitis.  White count is normal at 7.2 blood cultures are pending lactate is normal also at 0.7  No outpatient medications have been marked as taking for the 06/02/22 encounter [facility] Encounter).    Past Medical History:  Diagnosis Date   Diabetes mellitus without complication ([place])    Type 1 DM   Essential hypertension    Hypertension 03/04/2013   Neurologic disorder    Both feet   Recurrent UTI    Renal disorder     Past Surgical History:  Procedure Laterality Date   abscess removal     excision of bartholin cyst   DIALYSIS/PERMA CATHETER INSERTION N/A 04/24/2022   Procedure: DIALYSIS/PERMA CATHETER INSERTION;  Surgeon: Katha Cabal, MD;  Location: [place] [facility];  Service: Cardiovascular;  Laterality: N/A;   Nexplanon  01/2011    Social History Social History   Tobacco Use   Smoking status: Never   Smokeless tobacco: Never  Vaping Use   Vaping Use: Never used  Substance Use Topics   Alcohol use: Not Currently    Alcohol/week: 2.0 standard drinks of alcohol    Types: 2 Shots of liquor per week   Drug use: No    Family History Family History  Problem Relation Age of Onset   Breast cancer Mother 35   Lung cancer Maternal Grandmother    Diabetes type II Paternal Grandmother     No Known Allergies   REVIEW OF SYSTEMS (Negative unless checked)  Constitutional: [] Weight loss  [] Fever  [] Chills Cardiac: [] Chest pain   [] Chest pressure   [] Palpitations   [] Shortness of breath when laying flat   [] Shortness of breath with exertion. Vascular:  [] Pain in legs with walking   [] Pain in legs at rest  [] History of DVT   [] Phlebitis   [] Swelling in legs   [] Varicose veins   [] Non-healing ulcers Pulmonary:   [] Uses home oxygen   [] Productive cough   [] Hemoptysis   [] Wheeze  [] COPD   [] Asthma Neurologic:  [] Dizziness   [] Seizures   [] History of stroke   [] History of TIA  [] Aphasia   [] Vissual changes   [] Weakness or numbness in arm   []   Weakness or numbness in leg Musculoskeletal:   [] Joint swelling   [] Joint pain   [] Low back pain Hematologic:  [] Easy bruising  [] Easy bleeding   [] Hypercoagulable state   [] Anemic Gastrointestinal:  [] Diarrhea   [] Vomiting  [] Gastroesophageal reflux/heartburn   [] Difficulty swallowing. Genitourinary:  [x] Chronic kidney disease   [] Difficult urination  [] Frequent urination   [] Blood in urine Skin:  [] Rashes   [] Ulcers  Psychological:  [] History of anxiety   []  History of major depression.  Physical Examination  Vitals:   06/02/22 1153 06/02/22 1159  BP:  (!) 171/107  Pulse:  93  Resp:  20  Temp:  98.5 F (36.9 C)  TempSrc:  Oral  SpO2:  100%  Weight: 63.5 kg    Height: 5\' 6"  (1.676 m)    Body mass index is 22.6 kg/m. Gen: WD/WN, NAD Head: [place]/AT, No temporalis wasting.  Ear/Nose/Throat: Hearing grossly intact, nares w/o erythema or drainage Eyes: PER, EOMI, sclera nonicteric.  Neck: Supple, no gross masses or lesions.  No JVD.  Pulmonary:  Good air movement, no audible wheezing, no use of accessory muscles.  Cardiac: RRR, precordium non-hyperdynamic. Vascular:   Right brachiocephalic fistula good thrill good bruit; right IJ tunneled catheter tender to palpation Vessel Right Left  Radial Palpable Palpable  Brachial Palpable Palpable  Gastrointestinal: soft, non-distended. No guarding/no peritoneal signs.  Musculoskeletal: M/S 5/5 throughout.  No deformity.  Neurologic: CN 2-12 intact. Pain and light touch intact in extremities.  Symmetrical.  Speech is fluent. Motor exam as listed above. Psychiatric: Judgment intact, Mood & affect appropriate for pt's clinical situation. Dermatologic: No rashes or ulcers noted.  No changes consistent with cellulitis.   CBC Lab Results  Component Value Date   WBC 7.2 06/02/2022   HGB 8.9 (L) 06/02/2022   HCT 27.8 (L) 06/02/2022   MCV 88.8 06/02/2022   PLT 351 06/02/2022    BMET    Component Value Date/Time   NA 142 06/02/2022 1205   NA 136 10/25/2019 1022   K 4.0 06/02/2022 1205   CL 109 06/02/2022 1205   CO2 21 (L) 06/02/2022 1205   GLUCOSE 124 (H) 06/02/2022 1205   BUN 28 (H) 06/02/2022 1205   BUN 21 (H) 10/25/2019 1022   CREATININE 5.25 (H) 06/02/2022 1205   CALCIUM 8.4 (L) 06/02/2022 1205   GFRNONAA 11 (L) 06/02/2022 1205   GFRAA 55 (L) 02/13/2020 1439   Estimated Creatinine Clearance: 15.1 mL/min (A) (by C-G formula based on SCr of 5.25 mg/dL (H)).  COAG Lab Results  Component Value Date   INR 1.16 08/16/2017    Radiology No results found.   Assessment/Plan End-stage renal disease requiring hemodialysis: The patient now has a functioning fistula and is no longer using the  tunnel catheter.  This is also hurting her.  It represents a potential for sepsis and life-threatening complications and therefore the tunnel catheter should be removed.  The risks and benefits of been reviewed all questions were answered patient agrees to proceed.  With respect to her fistula she should follow-up in the office in 2 to 3 weeks and we will begin surveillance of her hemodialysis access.  She has expressed that she has been planning to convert to peritoneal dialysis.  Hypertension: Continue antihypertensive medications as already ordered, these medications have been reviewed and there are no changes at this time.  Diabetes mellitus: Continue hypoglycemic medications as already ordered, these medications have been reviewed and there are no changes at this time.  Hgb A1C to  be monitored as already arranged by primary service    Hortencia Pilar, MD  06/02/2022 4:02 PM

## 2022-06-03 ENCOUNTER — Encounter: Payer: Self-pay | Admitting: Vascular Surgery

## 2022-06-04 ENCOUNTER — Other Ambulatory Visit: Payer: Self-pay

## 2022-06-04 ENCOUNTER — Encounter
Admission: RE | Admit: 2022-06-04 | Discharge: 2022-06-04 | Disposition: A | Discharge status: Home / Self Care | Payer: 59 | Source: Ambulatory Visit | Attending: Surgery | Admitting: Surgery

## 2022-06-04 VITALS — Ht 66.0 in | Wt 147.7 lb

## 2022-06-04 DIAGNOSIS — N186 End stage renal disease: Secondary | ICD-10-CM

## 2022-06-04 DIAGNOSIS — Z01818 Encounter for other preprocedural examination: Secondary | ICD-10-CM

## 2022-06-04 DIAGNOSIS — E1029 Type 1 diabetes mellitus with other diabetic kidney complication: Secondary | ICD-10-CM

## 2022-06-04 HISTORY — DX: Headache, unspecified: R51.9

## 2022-06-04 NOTE — Patient Instructions (Signed)
Your procedure is scheduled on: 06/09/22 Report to [place]. To find out your arrival time please call [phone] between 1PM - 3PM on 06/08/22.  Remember: Instructions that are not followed completely may result in serious medical risk, up to and including death, or upon the discretion of your surgeon and anesthesiologist your surgery may need to be rescheduled.     _X__ 1. Do not eat food after midnight the night before your procedure.                 No gum chewing or hard candies. You may drink clear liquids up to 2 hours                 before you are scheduled to arrive for your surgery-  Diabetics water only  __X__2.  On the morning of surgery brush your teeth with toothpaste and water, you                 may rinse your mouth with mouthwash if you wish.  Do not swallow any              toothpaste of mouthwash.     _X__ 3.  No Alcohol for 24 hours before or after surgery.   _X__ 4.  Do Not Smoke or use e-cigarettes For 24 Hours Prior to Your Surgery.                 Do not use any chewable tobacco products for at least 6 hours prior to                 surgery.  ____  5.  Bring all medications with you on the day of surgery if instructed.   __X__  6.  Notify your doctor if there is any change in your medical condition      (cold, fever, infections).     Do not wear jewelry, make-up, hairpins, clips or nail polish. Do not wear lotions, powders, or perfumes.  Do not shave body hair 48 hours prior to surgery. Men may shave face and neck. Do not bring valuables to the hospital.    [facility] is not responsible for any belongings or valuables.  Contacts, dentures/partials or body piercings may not be worn into surgery. Bring a case for your contacts, glasses or hearing aids, a denture cup will be supplied. Leave your suitcase in the car. After surgery it may be brought to your room. For patients admitted to the hospital,  discharge time is determined by your treatment team.   Patients discharged the day of surgery will not be allowed to drive home.     __X__ Take these medicines the morning of surgery with A SIP OF WATER:    1. amLODipine (NORVASC) 10 MG tablet  2. atorvastatin (LIPITOR) 40 MG tablet  3. montelukast (SINGULAIR) 10 MG tablet  4.  5.  6.  ____ Fleet Enema (as directed)   ____ Use CHG Soap/SAGE wipes as directed  ____ Use inhalers on the day of surgery  ____ Stop metformin/Janumet/Farxiga 2 days prior to surgery    ____ Take 1/2 of usual insulin dose the night before surgery. No insulin the morning          of surgery.   ____ Stop Blood Thinners Coumadin/Plavix/Xarelto/Pleta/Pradaxa/Eliquis/Effient/Aspirin  on   Or contact your Surgeon, Cardiologist or Medical Doctor regarding  ability to stop your blood thinners  __X__  Stop Anti-inflammatories 7 days before surgery such as Advil, Ibuprofen, Motrin,  BC or Goodies Powder, Naprosyn, Naproxen, Aleve, Aspirin    __X__ Stop all herbal supplements, fish oil or vitamin E until after surgery.    ____ Bring C-Pap to the hospital.   Use your Humalog as usual with meals + sliding scale as needed per our conversation. Recheck blood sugar at bedtime the night before your procedure. We will contact you with any further information if needed.

## 2022-06-07 LAB — CULTURE, BLOOD (ROUTINE X 2): Culture: NO GROWTH

## 2022-06-09 ENCOUNTER — Ambulatory Visit: Payer: Medicaid Other | Admitting: Urgent Care

## 2022-06-09 ENCOUNTER — Ambulatory Visit: Payer: Medicaid Other | Admitting: General Practice

## 2022-06-09 ENCOUNTER — Other Ambulatory Visit: Payer: Self-pay

## 2022-06-09 ENCOUNTER — Encounter: Payer: Self-pay | Admitting: Surgery

## 2022-06-09 ENCOUNTER — Encounter
Admission: RE | Disposition: A | Discharge status: Home / Self Care | Payer: Self-pay | Source: Home / Self Care | Attending: Surgery

## 2022-06-09 ENCOUNTER — Ambulatory Visit
Admission: RE | Admit: 2022-06-09 | Discharge: 2022-06-09 | Disposition: A | Discharge status: Home / Self Care | Payer: Medicaid Other | Attending: Surgery | Admitting: Surgery

## 2022-06-09 DIAGNOSIS — I10 Essential (primary) hypertension: Secondary | ICD-10-CM | POA: Diagnosis not present

## 2022-06-09 DIAGNOSIS — Z4902 Encounter for fitting and adjustment of peritoneal dialysis catheter: Secondary | ICD-10-CM | POA: Insufficient documentation

## 2022-06-09 DIAGNOSIS — N186 End stage renal disease: Secondary | ICD-10-CM

## 2022-06-09 DIAGNOSIS — E1022 Type 1 diabetes mellitus with diabetic chronic kidney disease: Secondary | ICD-10-CM | POA: Insufficient documentation

## 2022-06-09 DIAGNOSIS — Z01818 Encounter for other preprocedural examination: Secondary | ICD-10-CM

## 2022-06-09 DIAGNOSIS — Z9641 Presence of insulin pump (external) (internal): Secondary | ICD-10-CM | POA: Diagnosis not present

## 2022-06-09 DIAGNOSIS — E1029 Type 1 diabetes mellitus with other diabetic kidney complication: Secondary | ICD-10-CM

## 2022-06-09 DIAGNOSIS — K429 Umbilical hernia without obstruction or gangrene: Secondary | ICD-10-CM

## 2022-06-09 DIAGNOSIS — Z992 Dependence on renal dialysis: Secondary | ICD-10-CM

## 2022-06-09 HISTORY — PX: UMBILICAL HERNIA REPAIR: SHX196

## 2022-06-09 HISTORY — PX: CAPD INSERTION: SHX5233

## 2022-06-09 LAB — POCT I-STAT, CHEM 8
BUN: 32 mg/dL — ABNORMAL HIGH (ref 6–20)
Calcium, Ion: 0.9 mmol/L — ABNORMAL LOW (ref 1.15–1.40)
Chloride: 108 mmol/L (ref 98–111)
Creatinine, Ser: 7.1 mg/dL — ABNORMAL HIGH (ref 0.44–1.00)
Glucose, Bld: 117 mg/dL — ABNORMAL HIGH (ref 70–99)
HCT: 22 % — ABNORMAL LOW (ref 36.0–46.0)
Hemoglobin: 7.5 g/dL — ABNORMAL LOW (ref 12.0–15.0)
Potassium: 3.9 mmol/L (ref 3.5–5.1)
Sodium: 137 mmol/L (ref 135–145)
TCO2: 21 mmol/L — ABNORMAL LOW (ref 22–32)

## 2022-06-09 LAB — GLUCOSE, CAPILLARY
Glucose-Capillary: 107 mg/dL — ABNORMAL HIGH (ref 70–99)
Glucose-Capillary: 207 mg/dL — ABNORMAL HIGH (ref 70–99)

## 2022-06-09 LAB — POCT PREGNANCY, URINE: Preg Test, Ur: NEGATIVE

## 2022-06-09 SURGERY — LAPAROSCOPIC INSERTION CONTINUOUS AMBULATORY PERITONEAL DIALYSIS  (CAPD) CATHETER
Anesthesia: General

## 2022-06-09 MED ORDER — GABAPENTIN 300 MG PO CAPS
ORAL_CAPSULE | ORAL | Status: AC
  Administered 2022-06-09: 300 mg via ORAL
  Filled 2022-06-09: qty 1

## 2022-06-09 MED ORDER — GLYCOPYRROLATE 0.2 MG/ML IJ SOLN
INTRAMUSCULAR | Status: DC | PRN
  Administered 2022-06-09: .2 mg via INTRAVENOUS

## 2022-06-09 MED ORDER — DROPERIDOL 2.5 MG/ML IJ SOLN
INTRAMUSCULAR | Status: DC
Start: 2022-06-09 — End: 2022-06-09
  Filled 2022-06-09: qty 2

## 2022-06-09 MED ORDER — SODIUM CHLORIDE 0.9 % IV SOLN: INTRAVENOUS | Status: DC

## 2022-06-09 MED ORDER — FENTANYL CITRATE (PF) 100 MCG/2ML IJ SOLN
INTRAMUSCULAR | Status: AC
  Filled 2022-06-09: qty 2

## 2022-06-09 MED ORDER — CHLORHEXIDINE GLUCONATE CLOTH 2 % EX PADS: 6.0000 | MEDICATED_PAD | Freq: Once | CUTANEOUS | Status: DC

## 2022-06-09 MED ORDER — BUPIVACAINE-EPINEPHRINE (PF) 0.5% -1:200000 IJ SOLN
INTRAMUSCULAR | Status: AC
  Filled 2022-06-09: qty 30

## 2022-06-09 MED ORDER — CEFAZOLIN SODIUM-DEXTROSE 2-4 GM/100ML-% IV SOLN
INTRAVENOUS | Status: AC
  Filled 2022-06-09: qty 100

## 2022-06-09 MED ORDER — ONDANSETRON HCL 4 MG/2ML IJ SOLN
INTRAMUSCULAR | Status: DC | PRN
  Administered 2022-06-09: 4 mg via INTRAVENOUS

## 2022-06-09 MED ORDER — CHLORHEXIDINE GLUCONATE 0.12 % MT SOLN
OROMUCOSAL | Status: AC
  Administered 2022-06-09: 15 mL via OROMUCOSAL
  Filled 2022-06-09: qty 15

## 2022-06-09 MED ORDER — ORAL CARE MOUTH RINSE: 15.0000 mL | Freq: Once | OROMUCOSAL | Status: AC

## 2022-06-09 MED ORDER — OXYCODONE HCL 5 MG PO TABS: 5.0000 mg | ORAL_TABLET | ORAL | 0 refills | Status: DC | PRN

## 2022-06-09 MED ORDER — SUGAMMADEX SODIUM 200 MG/2ML IV SOLN
INTRAVENOUS | Status: DC | PRN
  Administered 2022-06-09: 200 mg via INTRAVENOUS

## 2022-06-09 MED ORDER — MIDAZOLAM HCL 2 MG/2ML IJ SOLN
INTRAMUSCULAR | Status: AC
  Filled 2022-06-09: qty 2

## 2022-06-09 MED ORDER — BUPIVACAINE-EPINEPHRINE (PF) 0.5% -1:200000 IJ SOLN
INTRAMUSCULAR | Status: DC | PRN
  Administered 2022-06-09: 30 mL

## 2022-06-09 MED ORDER — PROPOFOL 10 MG/ML IV BOLUS
INTRAVENOUS | Status: DC | PRN
  Administered 2022-06-09: 150 mg via INTRAVENOUS

## 2022-06-09 MED ORDER — ROCURONIUM BROMIDE 100 MG/10ML IV SOLN
INTRAVENOUS | Status: DC | PRN
  Administered 2022-06-09: 50 mg via INTRAVENOUS

## 2022-06-09 MED ORDER — BUPIVACAINE LIPOSOME 1.3 % IJ SUSP: 20.0000 mL | Freq: Once | INTRAMUSCULAR | Status: DC

## 2022-06-09 MED ORDER — ACETAMINOPHEN 500 MG PO TABS: 1000.0000 mg | ORAL_TABLET | ORAL | Status: AC

## 2022-06-09 MED ORDER — FENTANYL CITRATE (PF) 100 MCG/2ML IJ SOLN
25.0000 ug | INTRAMUSCULAR | Status: DC | PRN
  Administered 2022-06-09: 25 ug via INTRAVENOUS
  Administered 2022-06-09: 50 ug via INTRAVENOUS
  Administered 2022-06-09 (×3): 25 ug via INTRAVENOUS

## 2022-06-09 MED ORDER — DEXAMETHASONE SODIUM PHOSPHATE 10 MG/ML IJ SOLN
INTRAMUSCULAR | Status: DC | PRN
  Administered 2022-06-09: 4 mg via INTRAVENOUS

## 2022-06-09 MED ORDER — ONDANSETRON HCL 4 MG/2ML IJ SOLN
INTRAMUSCULAR | Status: AC
  Filled 2022-06-09: qty 2

## 2022-06-09 MED ORDER — CHLORHEXIDINE GLUCONATE CLOTH 2 % EX PADS
6.0000 | MEDICATED_PAD | Freq: Once | CUTANEOUS | Status: AC
  Administered 2022-06-09: 6 via TOPICAL

## 2022-06-09 MED ORDER — FAMOTIDINE 20 MG PO TABS
ORAL_TABLET | ORAL | Status: AC
  Administered 2022-06-09: 20 mg via ORAL
  Filled 2022-06-09: qty 1

## 2022-06-09 MED ORDER — OXYCODONE HCL 5 MG PO TABS
5.0000 mg | ORAL_TABLET | Freq: Once | ORAL | Status: AC | PRN
  Administered 2022-06-09: 5 mg via ORAL

## 2022-06-09 MED ORDER — HEPARIN SODIUM (PORCINE) 5000 UNIT/ML IJ SOLN
INTRAMUSCULAR | Status: AC
  Filled 2022-06-09: qty 1

## 2022-06-09 MED ORDER — OXYCODONE HCL 5 MG PO TABS
ORAL_TABLET | ORAL | Status: AC
  Filled 2022-06-09: qty 1

## 2022-06-09 MED ORDER — LIDOCAINE HCL (CARDIAC) PF 100 MG/5ML IV SOSY
PREFILLED_SYRINGE | INTRAVENOUS | Status: DC | PRN
  Administered 2022-06-09: 100 mg via INTRAVENOUS

## 2022-06-09 MED ORDER — GABAPENTIN 300 MG PO CAPS: 300.0000 mg | ORAL_CAPSULE | ORAL | Status: AC

## 2022-06-09 MED ORDER — SEVOFLURANE IN SOLN
RESPIRATORY_TRACT | Status: AC
  Filled 2022-06-09: qty 250

## 2022-06-09 MED ORDER — ONDANSETRON HCL 4 MG/2ML IJ SOLN
4.0000 mg | Freq: Once | INTRAMUSCULAR | Status: AC
Start: 2022-06-09 — End: 2022-06-09
  Administered 2022-06-09: 4 mg via INTRAVENOUS

## 2022-06-09 MED ORDER — HEPARIN 5000 UNITS IN NS 1000 ML (FLUSH)
INTRAMUSCULAR | Status: DC | PRN
  Administered 2022-06-09: 1000 mL via INTRAMUSCULAR

## 2022-06-09 MED ORDER — DROPERIDOL 2.5 MG/ML IJ SOLN
1.2500 mg | Freq: Once | INTRAMUSCULAR | Status: AC
Start: 2022-06-09 — End: 2022-06-09
  Administered 2022-06-09: 1.25 mg via INTRAVENOUS

## 2022-06-09 MED ORDER — PROPOFOL 10 MG/ML IV BOLUS
INTRAVENOUS | Status: AC
  Filled 2022-06-09: qty 20

## 2022-06-09 MED ORDER — MIDAZOLAM HCL 2 MG/2ML IJ SOLN
INTRAMUSCULAR | Status: DC | PRN
  Administered 2022-06-09 (×2): 1 mg via INTRAVENOUS

## 2022-06-09 MED ORDER — ACETAMINOPHEN 500 MG PO TABS
ORAL_TABLET | ORAL | Status: AC
  Administered 2022-06-09: 1000 mg via ORAL
  Filled 2022-06-09: qty 2

## 2022-06-09 MED ORDER — CHLORHEXIDINE GLUCONATE 0.12 % MT SOLN: 15.0000 mL | Freq: Once | OROMUCOSAL | Status: AC

## 2022-06-09 MED ORDER — FENTANYL CITRATE (PF) 100 MCG/2ML IJ SOLN
INTRAMUSCULAR | Status: DC | PRN
  Administered 2022-06-09 (×3): 50 ug via INTRAVENOUS

## 2022-06-09 MED ORDER — ACETAMINOPHEN 500 MG PO TABS: 1000.0000 mg | ORAL_TABLET | Freq: Four times a day (QID) | ORAL | Status: DC | PRN

## 2022-06-09 MED ORDER — ONDANSETRON HCL 4 MG PO TABS: 4.0000 mg | ORAL_TABLET | Freq: Three times a day (TID) | ORAL | 0 refills | Status: DC | PRN

## 2022-06-09 MED ORDER — LACTATED RINGERS IV SOLN: INTRAVENOUS | Status: DC | PRN

## 2022-06-09 MED ORDER — CEFAZOLIN SODIUM-DEXTROSE 2-4 GM/100ML-% IV SOLN
2.0000 g | INTRAVENOUS | Status: AC
  Administered 2022-06-09: 2 g via INTRAVENOUS

## 2022-06-09 MED ORDER — OXYCODONE HCL 5 MG/5ML PO SOLN: 5.0000 mg | Freq: Once | ORAL | Status: AC | PRN

## 2022-06-09 MED ORDER — FAMOTIDINE 20 MG PO TABS: 20.0000 mg | ORAL_TABLET | Freq: Once | ORAL | Status: AC

## 2022-06-09 SURGICAL SUPPLY — 59 items
ADAPTER CATH DIALYSIS 4X8 IT L (MISCELLANEOUS) ×2 IMPLANT
BLADE SURG 11 STRL SS SAFETY (MISCELLANEOUS) ×2 IMPLANT
BLADE SURG 15 STRL LF DISP TIS (BLADE) ×1 IMPLANT
BLADE SURG 15 STRL SS (BLADE)
CATH EXTENDED DIALYSIS (CATHETERS) ×2 IMPLANT
CHLORAPREP W/TINT 26 (MISCELLANEOUS) ×2 IMPLANT
DERMABOND ADVANCED (GAUZE/BANDAGES/DRESSINGS) ×1
DERMABOND ADVANCED .7 DNX12 (GAUZE/BANDAGES/DRESSINGS) ×1 IMPLANT
DRAPE LAPAROTOMY 77X122 PED (DRAPES) ×1 IMPLANT
ELECT CAUTERY BLADE 6.4 (BLADE) ×2 IMPLANT
ELECT CAUTERY BLADE TIP 2.5 (TIP) ×2
ELECT REM PT RETURN 9FT ADLT (ELECTROSURGICAL) ×2
ELECTRODE CAUTERY BLDE TIP 2.5 (TIP) ×1 IMPLANT
ELECTRODE REM PT RTRN 9FT ADLT (ELECTROSURGICAL) ×1 IMPLANT
GAUZE 4X4 16PLY ~~LOC~~+RFID DBL (SPONGE) ×1 IMPLANT
GLOVE SURG SYN 7.0 (GLOVE) ×6 IMPLANT
GLOVE SURG SYN 7.0 PF PI (GLOVE) ×1 IMPLANT
GLOVE SURG SYN 7.5  E (GLOVE) ×6
GLOVE SURG SYN 7.5 E (GLOVE) ×3 IMPLANT
GLOVE SURG SYN 7.5 PF PI (GLOVE) ×1 IMPLANT
GOWN STRL REUS W/ TWL LRG LVL3 (GOWN DISPOSABLE) ×3 IMPLANT
GOWN STRL REUS W/TWL LRG LVL3 (GOWN DISPOSABLE) ×6
IV NS 1000ML (IV SOLUTION) ×2
IV NS 1000ML BAXH (IV SOLUTION) ×1 IMPLANT
KIT TURNOVER KIT A (KITS) ×2 IMPLANT
LABEL OR SOLS (LABEL) ×2 IMPLANT
MANIFOLD NEPTUNE II (INSTRUMENTS) ×2 IMPLANT
MINICAP W/POVIDONE IODINE SOL (MISCELLANEOUS) ×2 IMPLANT
NDL INSUFFLATION 14GA 120MM (NEEDLE) ×1 IMPLANT
NEEDLE HYPO 22GX1.5 SAFETY (NEEDLE) ×2 IMPLANT
NEEDLE INSUFFLATION 14GA 120MM (NEEDLE) ×2 IMPLANT
NS IRRIG 500ML POUR BTL (IV SOLUTION) ×2 IMPLANT
PACK BASIN MINOR ARMC (MISCELLANEOUS) ×1 IMPLANT
PACK LAP CHOLECYSTECTOMY (MISCELLANEOUS) ×2 IMPLANT
SET CYSTO W/LG BORE CLAMP LF (SET/KITS/TRAYS/PACK) ×2 IMPLANT
SET TRANSFER 6 W/TWIST CLAMP 5 (SET/KITS/TRAYS/PACK) ×2 IMPLANT
SET TUBE SMOKE EVAC HIGH FLOW (TUBING) ×2 IMPLANT
SLEEVE ADV FIXATION 5X100MM (TROCAR) ×4 IMPLANT
SPONGE DRAIN TRACH 4X4 STRL 2S (GAUZE/BANDAGES/DRESSINGS) ×2 IMPLANT
STYLET FALLER (MISCELLANEOUS) ×1 IMPLANT
STYLET FALLER MEDIONICS (MISCELLANEOUS) IMPLANT
SUT ETHIBOND 0 MO6 C/R (SUTURE) ×1 IMPLANT
SUT ETHILON 2 0 FS 18 (SUTURE) ×2 IMPLANT
SUT MNCRL 4-0 (SUTURE) ×2
SUT MNCRL 4-0 27XMFL (SUTURE) ×1
SUT MNCRL AB 4-0 PS2 18 (SUTURE) ×2 IMPLANT
SUT PROLENE 2 0 FS (SUTURE) ×2 IMPLANT
SUT VIC AB 2-0 SH 27 (SUTURE) ×2
SUT VIC AB 2-0 SH 27XBRD (SUTURE) ×1 IMPLANT
SUT VIC AB 3-0 SH 27 (SUTURE) ×2
SUT VIC AB 3-0 SH 27X BRD (SUTURE) ×1 IMPLANT
SUT VICRYL 0 AB UR-6 (SUTURE) ×2 IMPLANT
SUTURE MNCRL 4-0 27XMF (SUTURE) ×1 IMPLANT
SYR 20ML LL LF (SYRINGE) ×2 IMPLANT
SYR BULB IRRIG 60ML STRL (SYRINGE) ×1 IMPLANT
SYS KII FIOS ACCESS ABD 5X100 (TROCAR) ×2
SYSTEM KII FIOS ACES ABD 5X100 (TROCAR) ×1 IMPLANT
TROCAR KII BLADELESS 5X150 (TROCAR) ×2 IMPLANT
WATER STERILE IRR 500ML POUR (IV SOLUTION) ×1 IMPLANT

## 2022-06-09 NOTE — Interval H&P Note (Signed)
History and Physical Interval Note:  06/09/2022 10:12 AM  Emma Thomas  has presented today for surgery, with the diagnosis of ESRD, umbilical hernia less 3 cm.  The various methods of treatment have been discussed with the patient and family. After consideration of risks, benefits and other options for treatment, the patient has consented to  Procedure(s): LAPAROSCOPIC INSERTION CONTINUOUS AMBULATORY PERITONEAL DIALYSIS  (CAPD) CATHETER, PD rep to be present (N/A) HERNIA REPAIR UMBILICAL ADULT (N/A) as a surgical intervention.  The patient's history has been reviewed, patient examined, no change in status, stable for surgery.  I have reviewed the patient's chart and labs.  Questions were answered to the patient's satisfaction.     Layal Javid

## 2022-06-09 NOTE — Progress Notes (Signed)
Patient nauseated and did vomit once. Food noted in the emesis (pieces of chicken and corn). Patient states that she had chipolte last night at 7 pm. Called Dr. Barbra Sarks and droperidol and Zofran ordered. Administered by this nurse at 1310 according to order. Cleaned patient up from emesis and applied new gown and lines. At this time patient looks much more comfortable and sleeping. Pain medication given per orders and patient pain scale. Eve Rn notified Dr. Barbra Sarks of patient elevated blood glucose. No intervention ordered at this time.

## 2022-06-09 NOTE — Anesthesia Procedure Notes (Signed)
Procedure Name: Intubation Date/Time: 06/09/2022 10:24 AM  Performed by: Babs Sciara, CRNAPre-anesthesia Checklist: Patient identified, Emergency Drugs available, Suction available, Patient being monitored and Timeout performed Patient Re-evaluated:Patient Re-evaluated prior to induction Oxygen Delivery Method: Circle system utilized Preoxygenation: Pre-oxygenation with 100% oxygen Induction Type: IV induction Ventilation: Mask ventilation without difficulty Laryngoscope Size: Mac and 3 Grade View: Grade I Tube type: Oral Tube size: 7.0 mm Number of attempts: 1 Airway Equipment and Method: Stylet Placement Confirmation: ETT inserted through vocal cords under direct vision, positive ETCO2 and breath sounds checked- equal and bilateral Secured at: 20 cm Tube secured with: Tape Dental Injury: Teeth and Oropharynx as per pre-operative assessment  Comments: Eyes taped ou,

## 2022-06-09 NOTE — Op Note (Signed)
Procedure Date:  06/09/2022  Pre-operative Diagnosis:  ESRD on dialysis, reducible umbilical hernia  Post-operative Diagnosis:  ESRD on dialysis, reducible umbilical hernia 1 cm  Procedure:  Laparoscopic peritoneal dialysis catheter placement; laparoscopic umbilical hernia repair.  Surgeon:  Melvyn Neth, MD  Anesthesia:  General endotracheal  Estimated Blood Loss:  10 ml  Specimens:  None  Complications:  None  Indications for Procedure:  This is a 26 y.o. female who presents with ESRD, currently on hemodialysis, but wanting to change to peritoneal dialysis.  She also has a reducible, small umbilical hernia.  The patient has opted for peritoneal dialysis and presents for catheter placement today.  The risks of bleeding, abscess or infection, potential for an open procedure, catheter failure, and injury to surrounding structures were all discussed with the patient and she was willing to proceed.  Description of Procedure: The patient was correctly identified in the preoperative area and brought into the operating room.  The patient was placed supine with VTE prophylaxis in place.  Appropriate time-outs were performed.  Anesthesia was induced and the patient was intubated.  Appropriate antibiotics were infused.  The abdomen was prepped and draped in a sterile fashion.  A peritoneal dialysis catheter with extension tubing were brought into the field and measurements were made for appropriate placement and location of the catheter and components in the patient's right abdominal wall.  An incision in the left upper quadrant was made and a Veress needle was introduced and pneumoperitoneum was established with appropriate opening pressures.  An additional incision was made in the left lower quadrant and Optiview technique was used to introduce a 5 mm laparoscopic port under visualization.  Then, an additional 5 mm port was placed at the location of the Veress needle.  There was no bowel injury  doing this.  The abdomen was inspected and there were no adhesions going to the pelvis, and the omentum was in a higher position and not draping down to the pelvis either.  She did have a small 1 cm umbilical hernia.  A small 5 mm incision was made at the center of the hernia within the umbilicus, and PMI was used to close the hernia using a transfascial 0 Ethibond suture in figure eight fashion.  This closed the hernia defect.  Then, A 2.5 cm incision was made at the planned entry location overlying the right rectus muscle, and cautery was used to dissect down the subcutaneous tissue to reach the anterior rectus sheath.  The anterior sheath was incised.  Then, a long 5 mm port was introduced through the rectus sheath incision and pushed to the posterior sheath and tunneled inferiorly for about 5-7 cm and then pushed through into the peritoneum.  The dialysis catheter was passed through the port and a laparoscopic grasper was used to place the coiled portion of the catheter in the pelvis.  The catheter was placed so that the first cuff was at the edge of the anterior rectus sheath.  At this point, pneumoperitoneum was released.  The rectus sheath was closed around the catheter cuff, and the catheter was then sized with the extension tubing for appropriate tunneling and exit in the right abdomen.  The catheter and extension tubing were connected using a titanium connector, and then the catheter was tunneled to the right upper quadrant via an additional incision and then tunneled inferiorly to it's planned exit site, about 4 cm distal to the last cuff.  There were no complications doing this, and  the catheter was maintained straight without kinks.  Then the connectors were placed and the catheter was tested for flow/drainage using heparinized saline.  It had good flow both in and out.  With the catheter in place, 30 ml of local anesthetic was infiltrated onto the rectus sheath, and all incisions.  The incisions were  closed in layers using 3-0 Vicryl and 4-0 Monocryl.  The wounds were cleaned and sealed with DermaBond.  A Biopatch was placed at the catheter exit site and the catheter was dressed with 4x4 gauze and porous tape.  The patient was emerged from anesthesia and extubated and brought to the recovery room for further management.  The patient tolerated the procedure well and all counts were correct at the end of the case.   Melvyn Neth, MD

## 2022-06-09 NOTE — Anesthesia Preprocedure Evaluation (Signed)
Anesthesia Evaluation  Patient identified by MRN, date of birth, ID band Patient awake    Reviewed: Allergy & Precautions, NPO status , Patient's Chart, lab work & pertinent test results  History of Anesthesia Complications Negative for: history of anesthetic complications  Airway Mallampati: I  TM Distance: >3 FB Neck ROM: full    Dental  (+) Teeth Intact   Pulmonary neg pulmonary ROS, neg COPD,    Pulmonary exam normal        Cardiovascular hypertension, negative cardio ROS Normal cardiovascular exam     Neuro/Psych PSYCHIATRIC DISORDERS negative neurological ROS     GI/Hepatic negative GI ROS, Neg liver ROS,   Endo/Other  negative endocrine ROSdiabetes  Renal/GU ESRFRenal disease     Musculoskeletal   Abdominal   Peds  Hematology negative hematology ROS (+)   Anesthesia Other Findings Past Medical History: No date: Diabetes mellitus without complication (HCC)     Comment:  Type 1 DM No date: Essential hypertension No date: Headache 03/04/2013: Hypertension No date: Neurologic disorder     Comment:  Both feet No date: Recurrent UTI No date: Renal disorder  Past Surgical History: No date: abscess removal     Comment:  excision of bartholin cyst 04/24/2022: DIALYSIS/PERMA CATHETER INSERTION; N/A     Comment:  Procedure: DIALYSIS/PERMA CATHETER INSERTION;  Surgeon:               Katha Cabal, MD;  Location: [facility];               Service: Cardiovascular;  Laterality: N/A; 06/02/2022: DIALYSIS/PERMA CATHETER REMOVAL; N/A     Comment:  Procedure: DIALYSIS/PERMA CATHETER REMOVAL;  Surgeon:               Katha Cabal, MD;  Location: [place] [facility];               Service: Cardiovascular;  Laterality: N/A; No date: INSERTION OF DIALYSIS CATHETER; Right     Comment:  ARM 01/2011: Nexplanon  BMI    Body Mass Index: 22.60 kg/m      Reproductive/Obstetrics negative OB  ROS                             Anesthesia Physical Anesthesia Plan  ASA: 3  Anesthesia Plan: General/Spinal   Post-op Pain Management:    Induction: Intravenous  PONV Risk Score and Plan: 3 and Ondansetron, Dexamethasone, Midazolam and Treatment may vary due to age or medical condition  Airway Management Planned: Oral ETT  Additional Equipment:   Intra-op Plan:   Post-operative Plan: Extubation in OR  Informed Consent: I have reviewed the patients History and Physical, chart, labs and discussed the procedure including the risks, benefits and alternatives for the proposed anesthesia with the patient or authorized representative who has indicated his/her understanding and acceptance.     Dental Advisory Given  Plan Discussed with: Anesthesiologist, CRNA and Surgeon  Anesthesia Plan Comments: (Patient consented for risks of anesthesia including but not limited to:  - adverse reactions to medications - damage to eyes, teeth, lips or other oral mucosa - nerve damage due to positioning  - sore throat or hoarseness - Damage to heart, brain, nerves, lungs, other parts of body or loss of life  Patient voiced understanding.)        Anesthesia Quick Evaluation

## 2022-06-09 NOTE — Transfer of Care (Addendum)
Immediate Anesthesia Transfer of Care Note  Patient: Jenay A Gilson  Procedure(s) Performed: LAPAROSCOPIC INSERTION CONTINUOUS AMBULATORY PERITONEAL DIALYSIS  (CAPD) CATHETER, PD rep to be present HERNIA REPAIR UMBILICAL ADULT  Patient Location: PACU  Anesthesia Type:General  Level of Consciousness: awake and oriented  Airway & Oxygen Therapy: Patient Spontanous Breathing and Patient connected to face mask oxygen  Post-op Assessment: Report given to RN and Post -op Vital signs reviewed and stable  Post vital signs: Reviewed and stable  Last Vitals:  Vitals Value Taken Time  BP 151/65 06/09/22 1230  Temp 36.5 C 06/09/22 1226  Pulse 94 06/09/22 1232  Resp 19 06/09/22 1232  SpO2 100 % 06/09/22 1232  Vitals shown include unvalidated device data.  Last Pain:  Vitals:   06/09/22 1226  TempSrc:   PainSc: Asleep         Complications: No notable events documented.

## 2022-06-09 NOTE — Discharge Instructions (Signed)

## 2022-06-10 ENCOUNTER — Encounter: Payer: Self-pay | Admitting: Surgery

## 2022-06-11 NOTE — Anesthesia Postprocedure Evaluation (Signed)
Anesthesia Post Note  Patient: Emma Thomas  Procedure(s) Performed: LAPAROSCOPIC INSERTION CONTINUOUS AMBULATORY PERITONEAL DIALYSIS  (CAPD) CATHETER, PD rep to be present [place]  Patient location during evaluation: PACU Anesthesia Type: Combined General/Spinal Level of consciousness: awake and alert Pain management: pain level controlled Vital Signs Assessment: post-procedure vital signs reviewed and stable Respiratory status: spontaneous breathing, nonlabored ventilation, respiratory function stable and patient connected to nasal cannula oxygen Cardiovascular status: blood pressure returned to baseline and stable Postop Assessment: no apparent nausea or vomiting Anesthetic complications: no   No notable events documented.   Last Vitals:  Vitals:   06/09/22 1400 06/09/22 1500  BP: (!) 174/104 (!) 166/104  Pulse: 97 90  Resp: 16 16  Temp: (!) 36.4 C   SpO2: 100% 100%    Last Pain:  Vitals:   06/09/22 1500  TempSrc:   PainSc: [place]

## 2022-06-13 ENCOUNTER — Other Ambulatory Visit: Payer: Self-pay | Admitting: Surgery

## 2022-06-13 MED ORDER — CYCLOBENZAPRINE HCL 5 MG PO TABS: 5.0000 mg | ORAL_TABLET | Freq: Three times a day (TID) | ORAL | 0 refills | Status: DC | PRN

## 2022-07-07 ENCOUNTER — Telehealth: Payer: Self-pay | Admitting: Surgery

## 2022-07-07 NOTE — Telephone Encounter (Signed)
Patient has follow up with Dr. Hampton Abbot in the [facility] location for 07/27/22.  Patient aware of this appt and location, date and time. Per Dr. Hampton Abbot may need PD cath revision.

## 2022-07-07 NOTE — Telephone Encounter (Signed)
Incoming call from [place] with [facility] [phone]). They have this patient in clinic with them now.  She had PD cath placement on 06/09/22 with Dr. Hampton Abbot.  (I have also sent secure chat to Dr. Hampton Abbot).  Her PD cath, they are having trouble with drain and intake, machine won't let it fill and patient is swelling.  Please call Emma Thomas at number above as soon as possible please. Thank you.

## 2022-07-10 ENCOUNTER — Encounter: Payer: Self-pay | Admitting: Surgery

## 2022-07-10 ENCOUNTER — Ambulatory Visit (INDEPENDENT_AMBULATORY_CARE_PROVIDER_SITE_OTHER): Payer: Commercial Managed Care - PPO | Admitting: Surgery

## 2022-07-10 ENCOUNTER — Telehealth: Payer: Self-pay | Admitting: Surgery

## 2022-07-10 VITALS — BP 143/91 | HR 102 | Temp 98.9°F | Wt 161.6 lb

## 2022-07-10 DIAGNOSIS — T85611A Breakdown (mechanical) of intraperitoneal dialysis catheter, initial encounter: Secondary | ICD-10-CM

## 2022-07-10 DIAGNOSIS — Z992 Dependence on renal dialysis: Secondary | ICD-10-CM | POA: Diagnosis not present

## 2022-07-10 DIAGNOSIS — N186 End stage renal disease: Secondary | ICD-10-CM

## 2022-07-10 DIAGNOSIS — Z09 Encounter for follow-up examination after completed treatment for conditions other than malignant neoplasm: Secondary | ICD-10-CM

## 2022-07-10 NOTE — H&P (View-Only) (Signed)
07/10/2022  History of Present Illness: Emma Thomas is a 26 y.o. female s/p laparoscopic peritoneal dialysis catheter placement on 06/09/22.  Patient presents because she's been having issues with the catheter not flushing or draining well.  The [facility] team has tried to flush the catheter with heparin and tpa but the catheter continues to malfunction.  The patient denies any abdominal pain, nausea, or vomiting, but reports that she feels her abdomen was swollen as the dialisate fluid was not able to come off easily.  Past Medical History: Past Medical History:  Diagnosis Date   Diabetes mellitus without complication ([employer])    Type 1 DM   Essential hypertension    Headache    Hypertension 03/04/2013   Neurologic disorder    Both feet   Recurrent UTI    Renal disorder      Past Surgical History: Past Surgical History:  Procedure Laterality Date   abscess removal     excision of bartholin cyst   CAPD INSERTION N/A 06/09/2022   Procedure: LAPAROSCOPIC INSERTION CONTINUOUS AMBULATORY PERITONEAL DIALYSIS  (CAPD) CATHETER, PD rep to be present;  Surgeon: Olean Ree, MD;  Location: [facility];  Service: General;  Laterality: N/A;   DIALYSIS/PERMA CATHETER INSERTION N/A 04/24/2022   Procedure: DIALYSIS/PERMA CATHETER INSERTION;  Surgeon: Katha Cabal, MD;  Location: [place] [facility];  Service: Cardiovascular;  Laterality: N/A;   DIALYSIS/PERMA CATHETER REMOVAL N/A 06/02/2022   Procedure: DIALYSIS/PERMA CATHETER REMOVAL;  Surgeon: Katha Cabal, MD;  Location: [place] [facility];  Service: Cardiovascular;  Laterality: N/A;   INSERTION OF DIALYSIS CATHETER Right    ARM   Nexplanon  08/2584   UMBILICAL HERNIA REPAIR N/A 06/09/2022   Procedure: HERNIA REPAIR UMBILICAL ADULT;  Surgeon: Olean Ree, MD;  Location: [facility];  Service: General;  Laterality: N/A;    Home Medications: Prior to Admission medications   Medication Sig Start Date End Date Taking? Authorizing  Provider  acetaminophen (TYLENOL) 325 MG tablet Take 2 tablets (650 mg total) by mouth every 6 (six) hours as needed for mild pain (or Fever >/= 101). 04/27/22  Yes Jennye Boroughs, MD  acetaminophen (TYLENOL) 500 MG tablet Take 2 tablets (1,000 mg total) by mouth every 6 (six) hours as needed for mild pain. 06/09/22  Yes Kedarius Aloisi, Jacqulyn Bath, MD  amLODipine (NORVASC) 10 MG tablet Take 1 tablet (10 mg total) by mouth daily. 04/28/22  Yes Jennye Boroughs, MD  atorvastatin (LIPITOR) 40 MG tablet Take 40 mg by mouth daily. 01/30/22  Yes [provider]  cyclobenzaprine (FLEXERIL) 5 MG tablet Take 1 tablet (5 mg total) by mouth 3 (three) times daily as needed for muscle spasms. 06/13/22  Yes Ellamay Fors, Jacqulyn Bath, MD  ferrous gluconate (FERGON) 324 MG tablet Take 324 mg by mouth daily with breakfast. 05/03/20  Yes [provider]  insulin lispro (HUMALOG) 100 UNIT/ML injection Inject 5-10 Units into the skin 3 (three) times daily before meals. Plus sliding scale 1 unit per 50 > 150   Yes [provider]  JUNEL 1/20 1-20 MG-MCG tablet Take 1 tablet by mouth daily. 04/17/22  Yes [provider]  montelukast (SINGULAIR) 10 MG tablet Take 10 mg by mouth daily. 04/19/22  Yes [provider]  ondansetron (ZOFRAN-ODT) 4 MG disintegrating tablet Take 4 mg by mouth every 8 (eight) hours as needed. 12/28/21  Yes [provider]  sodium bicarbonate 650 MG tablet Take 650 mg by mouth once. 12/28/21  Yes [provider]  SUMAtriptan (  IMITREX) 50 MG tablet Take 50 mg by mouth daily as needed for migraine. 11/11/21  Yes [provider]  Vitamin D, Ergocalciferol, (DRISDOL) 1.25 MG (50000 UNIT) CAPS capsule Take 50,000 Units by mouth once a week. 02/05/22  Yes [provider]    Allergies: No Known Allergies  Review of Systems: Review of Systems  Constitutional:  Negative for chills and fever.  Respiratory:  Negative for shortness of breath.   Cardiovascular:   Negative for chest pain.  Gastrointestinal:  Negative for abdominal pain, nausea and vomiting.  Genitourinary:  Negative for dysuria.  Skin:  Negative for rash.    Physical Exam BP (!) 143/91   Pulse (!) 102   Temp 98.9 F (37.2 C) (Oral)   Wt 161 lb 9.6 oz (73.3 kg)   SpO2 100%   BMI 26.08 kg/m  CONSTITUTIONAL: No acute distress, well nourished. HEENT:  Normocephalic, atraumatic, extraocular motion intact. RESPIRATORY:  Normal respiratory effort without pathologic use of accessory muscles. CARDIOVASCULAR: Regula rhythm and rate. GI: The abdomen is soft, non-distended, non-tender.  Incisions are healing well and are clean, dry, intact.  I was able to flush the catheter with normal saline and aspirate fluid back as well. NEUROLOGIC:  Motor and sensation is grossly normal.  Cranial nerves are grossly intact. PSYCH:  Alert and oriented to person, place and time. Affect is normal.  Assessment and Plan: This is a 26 y.o. female with peritoneal dialysis catheter malfunction.  --Discussed with the patient that it may be that there is fatty tissue such as epiploic appendages or omentum that is getting tangled around the tube, which could be causing the flushing and drainage issues.  They have tried using TPA in case of any clots, but has not worked.  The patient is in agreement to proceed. --Discussed with her then the plan for a laparoscopic PD catheter revision.  Discussed with her the risks of bleeding, infection, injury to surrounding structures, the possibility of having to replace the catheter, tack any omentum, and she's willing to proceed. --Will schedule her for surgery on 07/16/22.   Melvyn Neth, [facility]

## 2022-07-10 NOTE — Patient Instructions (Signed)
Our surgery scheduler Pamala Hurry will call you within 24-48 hours to get you scheduled. If you have not heard from her after 48 hours, please call our office. Have the blue sheet available when she calls to write down important information.   If you have any concerns or questions, please feel free to call our office.   Peritoneal Dialysis Information Peritoneal dialysis is a procedure that filters your blood. You may have this procedure if your kidneys are not working well. You can perform peritoneal dialysis yourself, or a machine can do it for you at night when you sleep.  Tell a health care provider about: Any allergies you have. All medicines you are taking, including vitamins, herbs, eye drops, creams, and over-the-counter medicines. Any problems you or family members have had with anesthetic medicines. Any blood disorders you have. Any surgeries you have. Any medical conditions you have. Whether you are pregnant or may be pregnant. What are the risks? Generally, this is a safe procedure. However, problems may occur, including: Infection in the lining of your abdomen (peritoneum). This is the most common problem. Infection in the area where the catheter was inserted. Discomfort in the area where the catheter was inserted. Weakened abdominal muscles. This may lead to a hernia, which can cause problems if left untreated. What happens before the procedure? It is important to safely prepare for treatment and take steps to prevent infection. Your health care provider will teach you how to prepare for a dialysis session. Preparation may involve: Putting on a mask. Closing doors and windows in the room where dialysis will be performed. Making sure to wash your hands before and during treatment. Anyone who touches you or the equipment should also wash his or her hands often. Making sure that tubing and equipment are germ-free (sterile). Checking the bag of fluid (dialysate) you will use during  the session, to make sure it is sealed and free of germs (uncontaminated). What happens during the procedure?  At the start of a session, your abdomen is filled with dialysate. The dialysate pulls waste, salt, and extra water through the peritoneum. At the end of the session, the dialysate, plus all the waste it pulled from your blood, is drained from your body. There are two kinds of peritoneal dialysis. You may be treated using: Continuous cycling peritoneal dialysis (CCPD). In this type, a machine called a cycler performs an exchange for you by filling and draining your abdomen while you sleep. Continuous ambulatory peritoneal dialysis (CAPD). In this type, you perform exchanges for yourself up to 5 times a day. Each exchange takes about 30-40 minutes. The amount of time the dialysate stays in your body (the dwell) usually varies from 1.5-3 hours. You may go about your day normally between exchanges. Some people may need to have both CAPD and CCPD. What can I expect after procedure? You may need to have lab work or other tests done to check on how well the dialysis is working. Change the bandage (dressing) around your permanent catheter as directed by your health care provider. Keep the dressing clean and dry. Weigh yourself after the treatment and write down your weight. Follow these instructions at home: Eating and drinking Follow your health care provider's instructions about diet. You should follow a diet plan that includes: Nutritional counseling with a dietitian. Vitamin supplements. High-quality proteins, such as meat, poultry, fish, and eggs. Most people on peritoneal dialysis need to eat a high-protein diet because protein is lost during the dialysis exchange.  Preventing constipation Avoid becoming constipated. Constipation prevents dialysate from draining well. To prevent constipation: Eat foods that are high in fiber, such as beans, whole grains, and fresh fruits and  vegetables. Limit foods that are high in fat and processed sugars, such as fried or sweet foods. Be active. Go to the restroom when you feel that you need to. Do not hold it in. Take over-the-counter or prescription medicines, such as laxatives, only if your health care provider recommends them. General instructions Keep a strict schedule. Dialysis must be done every day. Do not skip a day or an exchange. Make time for each exchange. Always keep the dialysate bags and other supplies in a cool, clean, and dry place. Take over-the-counter and prescription medicines only as told by your health care provider. Weigh yourself every day. Sudden weight gain may be a sign of a problem. Keep all follow-up visits. This is important. Where to find more information You can find more information about peritoneal dialysis treatment from: [facility] of Diabetes and Digestive and Kidney Diseases: [employer] National Kidney Foundation: www.kidney.org Contact a health care provider if: You have a fever or chills. You feel nauseous or you vomit. You have diarrhea. You have any problems with an exchange. Your blood pressure increases. You suddenly gain weight or feel short of breath. The catheter seems loose or feels like it is coming out. The fluid that has drained from your abdomen is pinkish or reddish. Women having their menstrual period do not need to seek medical care if the fluid is only a little pink or a little red. There are white strands in the dialysate that are large enough to get stuck in your tubing or catheter. Get help right away if: The area around the catheter swells or becomes red, tender, or painful. There is pus coming from the catheter area. The fluid that has drained from your abdomen is cloudy. You feel more abdominal pain or discomfort. Summary Peritoneal dialysis is a procedure that filters your blood. You may have this procedure if your kidneys are not working  well. CAPD and CCPD are the two kinds of peritoneal dialysis. Your health care provider will help you decide which kind is best for you. The main risks of peritoneal dialysis are infection and weakened abdominal muscles, which may lead to a hernia. This information is not intended to replace advice given to you by your health care provider. Make sure you discuss any questions you have with your health care provider. Document Revised: 06/20/2020 Document Reviewed: 06/20/2020 Elsevier Patient Education  [place].

## 2022-07-10 NOTE — Telephone Encounter (Signed)
Patient has been advised of Pre-Admission date/time, and Surgery date.  Surgery Date: 07/16/22 @ [facility] Preadmission Testing Date: 07/14/22 (phone 8a-1p)  Patient has been made aware to call [phone], between 1-3:00pm the day before surgery, to find out what time to arrive for surgery.

## 2022-07-10 NOTE — Progress Notes (Signed)
07/10/2022  History of Present Illness: Emma Thomas is a 26 y.o. female s/p laparoscopic peritoneal dialysis catheter placement on 06/09/22.  Patient presents because she's been having issues with the catheter not flushing or draining well.  The [facility] team has tried to flush the catheter with heparin and tpa but the catheter continues to malfunction.  The patient denies any abdominal pain, nausea, or vomiting, but reports that she feels her abdomen was swollen as the dialisate fluid was not able to come off easily.  Past Medical History: Past Medical History:  Diagnosis Date   Diabetes mellitus without complication ([place])    Type 1 DM   Essential hypertension    Headache    Hypertension 03/04/2013   Neurologic disorder    Both feet   Recurrent UTI    Renal disorder      Past Surgical History: Past Surgical History:  Procedure Laterality Date   abscess removal     excision of bartholin cyst   CAPD INSERTION N/A 06/09/2022   Procedure: LAPAROSCOPIC INSERTION CONTINUOUS AMBULATORY PERITONEAL DIALYSIS  (CAPD) CATHETER, PD rep to be present;  Surgeon: Olean Ree, MD;  Location: [facility] ORS;  Service: General;  Laterality: N/A;   DIALYSIS/PERMA CATHETER INSERTION N/A 04/24/2022   Procedure: DIALYSIS/PERMA CATHETER INSERTION;  Surgeon: Katha Cabal, MD;  Location: [place] [facility];  Service: Cardiovascular;  Laterality: N/A;   DIALYSIS/PERMA CATHETER REMOVAL N/A 06/02/2022   Procedure: DIALYSIS/PERMA CATHETER REMOVAL;  Surgeon: Katha Cabal, MD;  Location: [place] [facility];  Service: Cardiovascular;  Laterality: N/A;   INSERTION OF DIALYSIS CATHETER Right    ARM   Nexplanon  94/1740   UMBILICAL HERNIA REPAIR N/A 06/09/2022   Procedure: HERNIA REPAIR UMBILICAL ADULT;  Surgeon: Olean Ree, MD;  Location: [facility] ORS;  Service: General;  Laterality: N/A;    Home Medications: Prior to Admission medications   Medication Sig Start Date End Date Taking? Authorizing  Provider  acetaminophen (TYLENOL) 325 MG tablet Take 2 tablets (650 mg total) by mouth every 6 (six) hours as needed for mild pain (or Fever >/= 101). 04/27/22  Yes Jennye Boroughs, MD  acetaminophen (TYLENOL) 500 MG tablet Take 2 tablets (1,000 mg total) by mouth every 6 (six) hours as needed for mild pain. 06/09/22  Yes Fidelis Loth, Jacqulyn Bath, MD  amLODipine (NORVASC) 10 MG tablet Take 1 tablet (10 mg total) by mouth daily. 04/28/22  Yes Jennye Boroughs, MD  atorvastatin (LIPITOR) 40 MG tablet Take 40 mg by mouth daily. 01/30/22  Yes [provider]  cyclobenzaprine (FLEXERIL) 5 MG tablet Take 1 tablet (5 mg total) by mouth 3 (three) times daily as needed for muscle spasms. 06/13/22  Yes Charrisse Masley, Jacqulyn Bath, MD  ferrous gluconate (FERGON) 324 MG tablet Take 324 mg by mouth daily with breakfast. 05/03/20  Yes [provider]  insulin lispro (HUMALOG) 100 UNIT/ML injection Inject 5-10 Units into the skin 3 (three) times daily before meals. Plus sliding scale 1 unit per 50 > 150   Yes [provider]  JUNEL 1/20 1-20 MG-MCG tablet Take 1 tablet by mouth daily. 04/17/22  Yes [provider]  montelukast (SINGULAIR) 10 MG tablet Take 10 mg by mouth daily. 04/19/22  Yes [provider]  ondansetron (ZOFRAN-ODT) 4 MG disintegrating tablet Take 4 mg by mouth every 8 (eight) hours as needed. 12/28/21  Yes [provider]  sodium bicarbonate 650 MG tablet Take 650 mg by mouth once. 12/28/21  Yes [provider]  SUMAtriptan (  IMITREX) 50 MG tablet Take 50 mg by mouth daily as needed for migraine. 11/11/21  Yes [provider]  Vitamin D, Ergocalciferol, (DRISDOL) 1.25 MG (50000 UNIT) CAPS capsule Take 50,000 Units by mouth once a week. 02/05/22  Yes [provider]    Allergies: No Known Allergies  Review of Systems: Review of Systems  Constitutional:  Negative for chills and fever.  Respiratory:  Negative for shortness of breath.   Cardiovascular:   Negative for chest pain.  Gastrointestinal:  Negative for abdominal pain, nausea and vomiting.  Genitourinary:  Negative for dysuria.  Skin:  Negative for rash.    Physical Exam BP (!) 143/91   Pulse (!) 102   Temp 98.9 F (37.2 C) (Oral)   Wt 161 lb 9.6 oz (73.3 kg)   SpO2 100%   BMI 26.08 kg/m  CONSTITUTIONAL: No acute distress, well nourished. HEENT:  Normocephalic, atraumatic, extraocular motion intact. RESPIRATORY:  Normal respiratory effort without pathologic use of accessory muscles. CARDIOVASCULAR: Regula rhythm and rate. GI: The abdomen is soft, non-distended, non-tender.  Incisions are healing well and are clean, dry, intact.  I was able to flush the catheter with normal saline and aspirate fluid back as well. NEUROLOGIC:  Motor and sensation is grossly normal.  Cranial nerves are grossly intact. PSYCH:  Alert and oriented to person, place and time. Affect is normal.  Assessment and Plan: This is a 26 y.o. female with peritoneal dialysis catheter malfunction.  --Discussed with the patient that it may be that there is fatty tissue such as epiploic appendages or omentum that is getting tangled around the tube, which could be causing the flushing and drainage issues.  They have tried using TPA in case of any clots, but has not worked.  The patient is in agreement to proceed. --Discussed with her then the plan for a laparoscopic PD catheter revision.  Discussed with her the risks of bleeding, infection, injury to surrounding structures, the possibility of having to replace the catheter, tack any omentum, and she's willing to proceed. --Will schedule her for surgery on 07/16/22.   Melvyn Neth, [facility]

## 2022-07-14 ENCOUNTER — Encounter
Admission: RE | Admit: 2022-07-14 | Discharge: 2022-07-14 | Disposition: A | Discharge status: Home / Self Care | Payer: Medicaid Other | Source: Ambulatory Visit | Attending: Surgery | Admitting: Surgery

## 2022-07-14 ENCOUNTER — Other Ambulatory Visit: Payer: Self-pay

## 2022-07-14 DIAGNOSIS — N186 End stage renal disease: Secondary | ICD-10-CM

## 2022-07-14 DIAGNOSIS — Z01812 Encounter for preprocedural laboratory examination: Secondary | ICD-10-CM

## 2022-07-14 HISTORY — DX: Anemia, unspecified: D64.9

## 2022-07-14 NOTE — Patient Instructions (Addendum)
Your procedure is scheduled on: 07/16/22 - THURSDAY Report to the Registration Desk on the 1st floor of the [facility]. To find out your arrival time, please call [phone] between 1PM - 3PM on: 07/15/22 [facility] If your arrival time is 6:00 am, do not arrive prior to that time as the [facility] entrance doors do not open until 6:00 am.  REMEMBER: Instructions that are not followed completely may result in serious medical risk, up to and including death; or upon the discretion of your surgeon and anesthesiologist your surgery may need to be rescheduled.  Do not eat food after midnight the night before surgery.  No gum chewing, lozengers or hard candies.   TAKE THESE MEDICATIONS THE MORNING OF SURGERY WITH A SIP OF WATER:  - amLODipine (NORVASC) 10 MG tablet - JUNEL 1/20 1-20 MG   One week prior to surgery: Stop Anti-inflammatories (NSAIDS) such as Advil, Aleve, Ibuprofen, Motrin, Naproxen, Naprosyn and Aspirin based products such as Excedrin, Goodys Powder, BC Powder.  Stop ANY OVER THE COUNTER supplements until after surgery.  You may however, continue to take Tylenol if needed for pain up until the day of surgery.  No Alcohol for 24 hours before or after surgery.  No Smoking including e-cigarettes for 24 hours prior to surgery.  No chewable tobacco products for at least 6 hours prior to surgery.  No nicotine patches on the day of surgery.  Do not use any "recreational" drugs for at least a week prior to your surgery.  Please be advised that the combination of cocaine and anesthesia may have negative outcomes, up to and including death. If you test positive for cocaine, your surgery will be cancelled.  On the morning of surgery brush your teeth with toothpaste and water, you may rinse your mouth with mouthwash if you wish. Do not swallow any toothpaste or mouthwash.  Use CHG Soap or wipes as directed on instruction sheet.  Do not wear jewelry, make-up, hairpins,  clips or nail polish.  Do not wear lotions, powders, or perfumes.   Do not shave body from the neck down 48 hours prior to surgery just in case you cut yourself which could leave a site for infection.  Also, freshly shaved skin may become irritated if using the CHG soap.  Contact lenses, hearing aids and dentures may not be worn into surgery.  Do not bring valuables to the hospital. [facility] is not responsible for any missing/lost belongings or valuables.   Notify your doctor if there is any change in your medical condition (cold, fever, infection).  Wear comfortable clothing (specific to your surgery type) to the hospital.  After surgery, you can help prevent lung complications by doing breathing exercises.  Take deep breaths and cough every 1-2 hours. Your doctor may order a device called an Incentive Spirometer to help you take deep breaths. When coughing or sneezing, hold a pillow firmly against your incision with both hands. This is called "splinting." Doing this helps protect your incision. It also decreases belly discomfort.  If you are being admitted to the hospital overnight, leave your suitcase in the car. After surgery it may be brought to your room.  If you are being discharged the day of surgery, you will not be allowed to drive home. You will need a responsible adult (18 years or older) to drive you home and stay with you that night.   If you are taking public transportation, you will need to have a responsible  adult (18 years or older) with you. Please confirm with your physician that it is acceptable to use public transportation.   Please call the [place] Dept. at [phone] if you have any questions about these instructions.  Surgery Visitation Policy:  Patients undergoing a surgery or procedure may have two family members or support persons with them as long as the person is not COVID-19 positive or experiencing its symptoms.   Inpatient  Visitation:    Visiting hours are 7 a.m. to 8 p.m. Up to four visitors are allowed at one time in a patient room, including children. The visitors may rotate out with other people during the day. One designated support person (adult) may remain overnight.

## 2022-07-14 NOTE — Pre-Procedure Instructions (Signed)
Patient is type 1 diabetic with a Tandem pump and Dexcom meter, patient will remove her pump before her procedure due to the location of her pump. She wears her pump on her abdomin and will be having a PD catheter revision. This Probation officer informed DM coordinator, Hanks, and she advised that patient make her Endocrinologist aware, this Probation officer informed the patient to contact her MD.

## 2022-07-15 MED ORDER — SODIUM CHLORIDE 0.9 % IV SOLN: INTRAVENOUS | Status: DC

## 2022-07-15 MED ORDER — GABAPENTIN 300 MG PO CAPS: 300.0000 mg | ORAL_CAPSULE | ORAL | Status: AC

## 2022-07-15 MED ORDER — CHLORHEXIDINE GLUCONATE CLOTH 2 % EX PADS
6.0000 | MEDICATED_PAD | Freq: Once | CUTANEOUS | Status: AC
  Administered 2022-07-16: 6 via TOPICAL

## 2022-07-15 MED ORDER — ACETAMINOPHEN 500 MG PO TABS: 1000.0000 mg | ORAL_TABLET | ORAL | Status: AC

## 2022-07-15 MED ORDER — CEFAZOLIN SODIUM-DEXTROSE 2-4 GM/100ML-% IV SOLN
2.0000 g | INTRAVENOUS | Status: AC
  Administered 2022-07-16: 2 g via INTRAVENOUS

## 2022-07-15 MED ORDER — FAMOTIDINE 20 MG PO TABS: 20.0000 mg | ORAL_TABLET | Freq: Once | ORAL | Status: AC

## 2022-07-15 MED ORDER — CHLORHEXIDINE GLUCONATE 0.12 % MT SOLN: 15.0000 mL | Freq: Once | OROMUCOSAL | Status: AC

## 2022-07-15 MED ORDER — ORAL CARE MOUTH RINSE: 15.0000 mL | Freq: Once | OROMUCOSAL | Status: AC

## 2022-07-15 MED ORDER — CHLORHEXIDINE GLUCONATE CLOTH 2 % EX PADS: 6.0000 | MEDICATED_PAD | Freq: Once | CUTANEOUS | Status: DC

## 2022-07-16 ENCOUNTER — Encounter: Payer: Self-pay | Admitting: Surgery

## 2022-07-16 ENCOUNTER — Ambulatory Visit: Payer: Commercial Managed Care - PPO | Admitting: Certified Registered"

## 2022-07-16 ENCOUNTER — Encounter
Admission: RE | Disposition: A | Discharge status: Home / Self Care | Payer: Self-pay | Source: Home / Self Care | Attending: Surgery

## 2022-07-16 ENCOUNTER — Ambulatory Visit
Admission: RE | Admit: 2022-07-16 | Discharge: 2022-07-16 | Disposition: A | Discharge status: Home / Self Care | Payer: Commercial Managed Care - PPO | Attending: Surgery | Admitting: Surgery

## 2022-07-16 ENCOUNTER — Other Ambulatory Visit: Payer: Self-pay

## 2022-07-16 DIAGNOSIS — E119 Type 2 diabetes mellitus without complications: Secondary | ICD-10-CM | POA: Insufficient documentation

## 2022-07-16 DIAGNOSIS — Z01812 Encounter for preprocedural laboratory examination: Secondary | ICD-10-CM

## 2022-07-16 DIAGNOSIS — X58XXXA Exposure to other specified factors, initial encounter: Secondary | ICD-10-CM | POA: Diagnosis not present

## 2022-07-16 DIAGNOSIS — N186 End stage renal disease: Secondary | ICD-10-CM

## 2022-07-16 DIAGNOSIS — Z992 Dependence on renal dialysis: Secondary | ICD-10-CM | POA: Diagnosis not present

## 2022-07-16 DIAGNOSIS — T85611A Breakdown (mechanical) of intraperitoneal dialysis catheter, initial encounter: Secondary | ICD-10-CM | POA: Diagnosis present

## 2022-07-16 HISTORY — PX: CAPD REVISION: SHX5260

## 2022-07-16 LAB — POCT I-STAT, CHEM 8
BUN: 42 mg/dL — ABNORMAL HIGH (ref 6–20)
Calcium, Ion: 0.89 mmol/L — CL (ref 1.15–1.40)
Chloride: 103 mmol/L (ref 98–111)
Creatinine, Ser: 6.8 mg/dL — ABNORMAL HIGH (ref 0.44–1.00)
Glucose, Bld: 197 mg/dL — ABNORMAL HIGH (ref 70–99)
HCT: 27 % — ABNORMAL LOW (ref 36.0–46.0)
Hemoglobin: 9.2 g/dL — ABNORMAL LOW (ref 12.0–15.0)
Potassium: 4.3 mmol/L (ref 3.5–5.1)
Sodium: 137 mmol/L (ref 135–145)
TCO2: 21 mmol/L — ABNORMAL LOW (ref 22–32)

## 2022-07-16 LAB — HCG, QUANTITATIVE, PREGNANCY: hCG, Beta Chain, Quant, S: 2 m[IU]/mL (ref <5)

## 2022-07-16 LAB — GLUCOSE, CAPILLARY: Glucose-Capillary: 163 mg/dL — ABNORMAL HIGH (ref 70–99)

## 2022-07-16 SURGERY — LAPAROSCOPIC REVISION CONTINUOUS AMBULATORY PERITONEAL DIALYSIS  (CAPD) CATHETER
Anesthesia: General | Site: Abdomen

## 2022-07-16 MED ORDER — HEPARIN 5000 UNITS IN NS 1000 ML (FLUSH)
INTRAMUSCULAR | Status: DC | PRN
  Administered 2022-07-16: 1 mL via INTRAMUSCULAR

## 2022-07-16 MED ORDER — OXYCODONE HCL 5 MG PO TABS
ORAL_TABLET | ORAL | Status: AC
  Filled 2022-07-16: qty 1

## 2022-07-16 MED ORDER — MIDAZOLAM HCL 2 MG/2ML IJ SOLN
INTRAMUSCULAR | Status: DC | PRN
  Administered 2022-07-16: 2 mg via INTRAVENOUS

## 2022-07-16 MED ORDER — GABAPENTIN 300 MG PO CAPS
ORAL_CAPSULE | ORAL | Status: AC
  Administered 2022-07-16: 300 mg via ORAL
  Filled 2022-07-16: qty 1

## 2022-07-16 MED ORDER — PHENYLEPHRINE HCL (PRESSORS) 10 MG/ML IV SOLN
INTRAVENOUS | Status: DC | PRN
  Administered 2022-07-16 (×2): 80 ug via INTRAVENOUS
  Administered 2022-07-16: 40 ug via INTRAVENOUS
  Administered 2022-07-16: 80 ug via INTRAVENOUS

## 2022-07-16 MED ORDER — BUPIVACAINE-EPINEPHRINE 0.5% -1:200000 IJ SOLN
INTRAMUSCULAR | Status: DC | PRN
  Administered 2022-07-16: 30 mL

## 2022-07-16 MED ORDER — FAMOTIDINE 20 MG PO TABS
ORAL_TABLET | ORAL | Status: AC
  Administered 2022-07-16: 20 mg via ORAL
  Filled 2022-07-16: qty 1

## 2022-07-16 MED ORDER — CHLORHEXIDINE GLUCONATE 0.12 % MT SOLN
OROMUCOSAL | Status: AC
  Administered 2022-07-16: 15 mL via OROMUCOSAL
  Filled 2022-07-16: qty 15

## 2022-07-16 MED ORDER — LIDOCAINE HCL (CARDIAC) PF 100 MG/5ML IV SOSY
PREFILLED_SYRINGE | INTRAVENOUS | Status: DC | PRN
  Administered 2022-07-16: 50 mg via INTRAVENOUS

## 2022-07-16 MED ORDER — OXYCODONE HCL 5 MG/5ML PO SOLN: 5.0000 mg | Freq: Once | ORAL | Status: AC | PRN

## 2022-07-16 MED ORDER — BUPIVACAINE-EPINEPHRINE (PF) 0.5% -1:200000 IJ SOLN
INTRAMUSCULAR | Status: AC
Start: 2022-07-16 — End: ?
  Filled 2022-07-16: qty 30

## 2022-07-16 MED ORDER — ACETAMINOPHEN 500 MG PO TABS
ORAL_TABLET | ORAL | Status: AC
  Administered 2022-07-16: 1000 mg via ORAL
  Filled 2022-07-16: qty 2

## 2022-07-16 MED ORDER — ACETAMINOPHEN 10 MG/ML IV SOLN
INTRAVENOUS | Status: AC
Start: 2022-07-16 — End: ?
  Filled 2022-07-16: qty 100

## 2022-07-16 MED ORDER — HALOPERIDOL LACTATE 5 MG/ML IJ SOLN
1.0000 mg | Freq: Once | INTRAMUSCULAR | Status: AC
Start: 2022-07-16 — End: 2022-07-16
  Administered 2022-07-16: 1 mg via INTRAVENOUS

## 2022-07-16 MED ORDER — MIDAZOLAM HCL 2 MG/2ML IJ SOLN
INTRAMUSCULAR | Status: AC
Start: 2022-07-16 — End: ?
  Filled 2022-07-16: qty 2

## 2022-07-16 MED ORDER — FENTANYL CITRATE (PF) 100 MCG/2ML IJ SOLN
INTRAMUSCULAR | Status: AC
  Filled 2022-07-16: qty 2

## 2022-07-16 MED ORDER — 0.9 % SODIUM CHLORIDE (POUR BTL) OPTIME
TOPICAL | Status: DC | PRN
  Administered 2022-07-16: 50 mL

## 2022-07-16 MED ORDER — ACETAMINOPHEN 10 MG/ML IV SOLN
INTRAVENOUS | Status: DC | PRN
  Administered 2022-07-16: 1000 mg via INTRAVENOUS

## 2022-07-16 MED ORDER — OXYCODONE HCL 5 MG PO TABS
5.0000 mg | ORAL_TABLET | Freq: Once | ORAL | Status: AC | PRN
  Administered 2022-07-16: 5 mg via ORAL

## 2022-07-16 MED ORDER — HALOPERIDOL LACTATE 5 MG/ML IJ SOLN
INTRAMUSCULAR | Status: AC
  Filled 2022-07-16: qty 1

## 2022-07-16 MED ORDER — FENTANYL CITRATE (PF) 100 MCG/2ML IJ SOLN
INTRAMUSCULAR | Status: DC | PRN
  Administered 2022-07-16 (×2): 50 ug via INTRAVENOUS

## 2022-07-16 MED ORDER — ACETAMINOPHEN 500 MG PO TABS: 1000.0000 mg | ORAL_TABLET | Freq: Four times a day (QID) | ORAL | Status: DC | PRN

## 2022-07-16 MED ORDER — PROPOFOL 10 MG/ML IV BOLUS
INTRAVENOUS | Status: DC | PRN
  Administered 2022-07-16: 150 mg via INTRAVENOUS

## 2022-07-16 MED ORDER — ROCURONIUM BROMIDE 100 MG/10ML IV SOLN
INTRAVENOUS | Status: DC | PRN
  Administered 2022-07-16: 50 mg via INTRAVENOUS

## 2022-07-16 MED ORDER — OXYCODONE HCL 5 MG PO TABS: 5.0000 mg | ORAL_TABLET | ORAL | 0 refills | Status: DC | PRN

## 2022-07-16 MED ORDER — FENTANYL CITRATE (PF) 100 MCG/2ML IJ SOLN
25.0000 ug | INTRAMUSCULAR | Status: DC | PRN
  Administered 2022-07-16 (×2): 25 ug via INTRAVENOUS

## 2022-07-16 MED ORDER — HEPARIN SODIUM (PORCINE) 5000 UNIT/ML IJ SOLN
INTRAMUSCULAR | Status: AC
  Filled 2022-07-16: qty 1

## 2022-07-16 MED ORDER — CEFAZOLIN SODIUM-DEXTROSE 2-4 GM/100ML-% IV SOLN
INTRAVENOUS | Status: AC
  Filled 2022-07-16: qty 100

## 2022-07-16 MED ORDER — DEXAMETHASONE SODIUM PHOSPHATE 10 MG/ML IJ SOLN
INTRAMUSCULAR | Status: DC | PRN
  Administered 2022-07-16: 5 mg via INTRAVENOUS

## 2022-07-16 MED ORDER — ONDANSETRON HCL 4 MG/2ML IJ SOLN
INTRAMUSCULAR | Status: DC | PRN
  Administered 2022-07-16: 4 mg via INTRAVENOUS

## 2022-07-16 SURGICAL SUPPLY — 52 items
ADAPTER CATH DIALYSIS 4X8 IT L (MISCELLANEOUS) IMPLANT
BLADE SURG 11 STRL SS SAFETY (MISCELLANEOUS) ×1 IMPLANT
CATH EXTENDED DIALYSIS (CATHETERS) ×1 IMPLANT
DERMABOND ADVANCED (GAUZE/BANDAGES/DRESSINGS) ×1
DERMABOND ADVANCED .7 DNX12 (GAUZE/BANDAGES/DRESSINGS) ×1 IMPLANT
ELECT CAUTERY BLADE 6.4 (BLADE) ×1 IMPLANT
ELECT REM PT RETURN 9FT ADLT (ELECTROSURGICAL) ×1
ELECTRODE REM PT RTRN 9FT ADLT (ELECTROSURGICAL) ×1 IMPLANT
GAUZE SPONGE 4X4 12PLY STRL (GAUZE/BANDAGES/DRESSINGS) IMPLANT
GLOVE SURG SYN 7.0 (GLOVE) ×1 IMPLANT
GLOVE SURG SYN 7.0 PF PI (GLOVE) ×1 IMPLANT
GLOVE SURG SYN 7.5  E (GLOVE) ×1
GLOVE SURG SYN 7.5 E (GLOVE) ×1 IMPLANT
GLOVE SURG SYN 7.5 PF PI (GLOVE) ×1 IMPLANT
GOWN STRL REUS W/ TWL LRG LVL3 (GOWN DISPOSABLE) ×2 IMPLANT
GOWN STRL REUS W/TWL LRG LVL3 (GOWN DISPOSABLE) ×2
GRASPER SUT TROCAR 14GX15 (MISCELLANEOUS) IMPLANT
IRRIGATION STRYKERFLOW (MISCELLANEOUS) IMPLANT
IRRIGATOR STRYKERFLOW (MISCELLANEOUS) ×1
IV NS 1000ML (IV SOLUTION) ×1
IV NS 1000ML BAXH (IV SOLUTION) ×1 IMPLANT
KIT TURNOVER KIT A (KITS) ×1 IMPLANT
LABEL OR SOLS (LABEL) ×1 IMPLANT
MANIFOLD NEPTUNE II (INSTRUMENTS) ×1 IMPLANT
MINICAP W/POVIDONE IODINE SOL (MISCELLANEOUS) ×1 IMPLANT
NDL INSUFFLATION 14GA 120MM (NEEDLE) ×1 IMPLANT
NEEDLE HYPO 22GX1.5 SAFETY (NEEDLE) ×1 IMPLANT
NEEDLE INSUFFLATION 14GA 120MM (NEEDLE) ×1 IMPLANT
NS IRRIG 500ML POUR BTL (IV SOLUTION) ×1 IMPLANT
PACK LAP CHOLECYSTECTOMY (MISCELLANEOUS) ×1 IMPLANT
PENCIL SMOKE EVACUATOR (MISCELLANEOUS) IMPLANT
SET CYSTO W/LG BORE CLAMP LF (SET/KITS/TRAYS/PACK) ×1 IMPLANT
SET TRANSFER 6 W/TWIST CLAMP 5 (SET/KITS/TRAYS/PACK) IMPLANT
SET TUBE SMOKE EVAC HIGH FLOW (TUBING) ×1 IMPLANT
SLEEVE ADV FIXATION 5X100MM (TROCAR) ×2 IMPLANT
SPONGE DRAIN TRACH 4X4 STRL 2S (GAUZE/BANDAGES/DRESSINGS) ×1 IMPLANT
STYLET FALLER (MISCELLANEOUS) IMPLANT
STYLET FALLER MEDIONICS (MISCELLANEOUS) IMPLANT
SUT ETHILON 2 0 FS 18 (SUTURE) ×1 IMPLANT
SUT MNCRL 4-0 (SUTURE) ×1
SUT MNCRL 4-0 27XMFL (SUTURE) ×1
SUT PROLENE 2 0 SH DA (SUTURE) IMPLANT
SUT VIC AB 3-0 SH 27 (SUTURE) ×1
SUT VIC AB 3-0 SH 27X BRD (SUTURE) ×1 IMPLANT
SUT VICRYL 0 AB UR-6 (SUTURE) ×1 IMPLANT
SUTURE MNCRL 4-0 27XMF (SUTURE) ×1 IMPLANT
SYS KII FIOS ACCESS ABD 5X100 (TROCAR) ×1
SYSTEM KII FIOS ACES ABD 5X100 (TROCAR) ×1 IMPLANT
TAPE CLOTH SURG 4X10 WHT LF (GAUZE/BANDAGES/DRESSINGS) IMPLANT
TRAP FLUID SMOKE EVACUATOR (MISCELLANEOUS) ×1 IMPLANT
TROCAR KII BLADELESS 5X150 (TROCAR) ×1 IMPLANT
WATER STERILE IRR 500ML POUR (IV SOLUTION) ×1 IMPLANT

## 2022-07-16 NOTE — Interval H&P Note (Signed)
History and Physical Interval Note:  07/16/2022 12:40 PM  Emma Thomas  has presented today for surgery, with the diagnosis of Peritoneal dialysis catheter malfunction.  The various methods of treatment have been discussed with the patient and family. After consideration of risks, benefits and other options for treatment, the patient has consented to  Procedure(s): [place]  (CAPD) CATHETER (N/A) as a surgical intervention.  The patient's history has been reviewed, patient examined, no change in status, stable for surgery.  I have reviewed the patient's chart and labs.  Questions were answered to the patient's satisfaction.     Devario Bucklew

## 2022-07-16 NOTE — Discharge Instructions (Signed)
AMBULATORY SURGERY  ?DISCHARGE INSTRUCTIONS ? ? ?The drugs that you were given will stay in your system until tomorrow so for the next 24 hours you should not: ? ?Drive an automobile ?Make any legal decisions ?Drink any alcoholic beverage ? ? ?You may resume regular meals tomorrow.  Today it is better to start with liquids and gradually work up to solid foods. ? ?You may eat anything you prefer, but it is better to start with liquids, then soup and crackers, and gradually work up to solid foods. ? ? ?Please notify your doctor immediately if you have any unusual bleeding, trouble breathing, redness and pain at the surgery site, drainage, fever, or pain not relieved by medication. ? ? ? ?Additional Instructions: ? ? ? ?Please contact your physician with any problems or Same Day Surgery at [phone], Monday through Friday 6 am to 4 pm, or [place] at [place] Main number at [phone].  ?

## 2022-07-16 NOTE — Op Note (Addendum)
Procedure Date:  07/16/2022  Pre-operative Diagnosis:  Peritoneal dialysis catheter malfunction  Post-operative Diagnosis:  Peritoneal dialysis catheter malfunction secondary to tangled bilateral Fallopian tubes and fibrin clot in the tubing  Procedure:   Laparoscopic peritoneal dialysis catheter revision with manual removal of fibrin clot. Bilateral salpingo-pexy to lateral lower abdominal wall.  Surgeon:  Melvyn Neth, MD  Anesthesia:  General endotracheal  Estimated Blood Loss:  10 ml  Specimens:  None  Complications:  None  Findings:  The patient's peritoneal dialysis catheter was noted to be tangled with bilateral Fallopian tubes.  There was also fibrin clot within the tube.  Indications for Procedure:  This is a 26 y.o. female who presents with peritoneal dialysis catheter malfunction.  The patient presents for catheter revision.  The risks of bleeding, abscess or infection, potential for an open procedure, catheter failure, and injury to surrounding structures were all discussed with the patient and she was willing to proceed.  Description of Procedure: The patient was correctly identified in the preoperative area and brought into the operating room.  The patient was placed supine with VTE prophylaxis in place.  Appropriate time-outs were performed.  Anesthesia was induced and the patient was intubated.  Appropriate antibiotics were infused.  The abdomen was prepped and draped in a sterile fashion incorporating the patient's existing peritoneal dialysis catheter.  An incision in the left upper quadrant was made and a Veress needle was introduced and pneumoperitoneum was established with appropriate opening pressures.  An additional incision was made in the left abdominal wall and Optiview technique was used to introduce a 5 mm laparoscopic port under visualization.  Then, an additional 5 mm port was placed at the location of the Veress needle.  There was no bowel injury doing  this.  The abdomen was inspected and the peritoneal dialysis catheter was noted to be entangled with bilateral Fallopian tubes.  At that point, I asked for OB/GYN on call, Dr. Marcelline Mates, to come in for consultation.  I inserted an additional 5 mm port in the right lateral abdomen and was able to untangle the Fallopian tubes and get them off the catheter.  The catheter was noted to have fibrin clots within, so the catheter tip was pulled out through the right lateral port and was externalized.  Then, the clots were manually milked/stripped off the catheter lumen.  The catheter was then introduced back into the abdomen and placed in the pelvis.  After discussing with Dr. Marcelline Mates, we determined that pexying the Fallopian tubes would be appropriate and would not hinder future pregnancies.  Then, both tubes were tacked onto the lateral abdominal wall on either side using a transfascial 2-0 Prolene.  After that, the pelvis was irrigated to remove any possible clots and pneumoperitoneum was released.  The catheter was tested for flow/drainage using heparinized saline with good inflow and outflow.  With the catheter in place, 30 ml of local anesthetic was infiltrated into all incisions and the areas of the transfacial sutures. The incisions were closed in layers using 3-0 Vicryl and 4-0 Monocryl.  The wounds were cleaned and sealed with DermaBond.  A Biopatch was placed at the catheter exit site and the catheter was dressed with 4x4 gauze and porous tape.  The patient was emerged from anesthesia and extubated and brought to the recovery room for further management.  The patient tolerated the procedure well and all counts were correct at the end of the case.   Melvyn Neth, MD

## 2022-07-16 NOTE — Anesthesia Procedure Notes (Signed)
Procedure Name: Intubation Date/Time: 07/16/2022 1:05 PM  Performed by: Biagio Borg, CRNAPre-anesthesia Checklist: Patient identified, Emergency Drugs available, Suction available and Patient being monitored Patient Re-evaluated:Patient Re-evaluated prior to induction Oxygen Delivery Method: Circle system utilized Preoxygenation: Pre-oxygenation with 100% oxygen Induction Type: IV induction Ventilation: Mask ventilation without difficulty Laryngoscope Size: McGraph and 3 Grade View: Grade I Tube type: Oral Number of attempts: 1 Airway Equipment and Method: Stylet Placement Confirmation: ETT inserted through vocal cords under direct vision, positive ETCO2 and breath sounds checked- equal and bilateral Secured at: 21 cm Tube secured with: Tape Dental Injury: Teeth and Oropharynx as per pre-operative assessment

## 2022-07-16 NOTE — OR Nursing (Signed)
962ml in through the peritoneal catheter. 842ml out 50 ml remain.

## 2022-07-16 NOTE — Anesthesia Preprocedure Evaluation (Addendum)
Anesthesia Evaluation  Patient identified by MRN, date of birth, ID band Patient awake    Reviewed: Allergy & Precautions, NPO status , Patient's Chart, lab work & pertinent test results  History of Anesthesia Complications Negative for: history of anesthetic complications  Airway Mallampati: III  TM Distance: >3 FB Neck ROM: full    Dental  (+) Chipped   Pulmonary neg pulmonary ROS, neg shortness of breath,    Pulmonary exam normal        Cardiovascular Exercise Tolerance: Good hypertension, (-) angina(-) Past MI Normal cardiovascular exam     Neuro/Psych  Headaches, negative psych ROS   GI/Hepatic negative GI ROS, Neg liver ROS, neg GERD  ,  Endo/Other  diabetes, Type 2  Renal/GU DialysisRenal disease     Musculoskeletal   Abdominal   Peds  Hematology negative hematology ROS (+)   Anesthesia Other Findings Past Medical History: No date: Anemia No date: Diabetes mellitus without complication ([facility])     Comment:  Type 1 DM No date: Essential hypertension No date: Headache 03/04/2013: Hypertension No date: Neurologic disorder     Comment:  Both feet No date: Recurrent UTI No date: Renal disorder  Past Surgical History: No date: abscess removal     Comment:  excision of bartholin cyst 06/09/2022: CAPD INSERTION; N/A     Comment:  Procedure: LAPAROSCOPIC INSERTION CONTINUOUS AMBULATORY               PERITONEAL DIALYSIS  (CAPD) CATHETER, PD rep to be               present;  Surgeon: Olean Ree, MD;  Location: [facility]               ORS;  Service: General;  Laterality: N/A; 04/24/2022: DIALYSIS/PERMA CATHETER INSERTION; N/A     Comment:  Procedure: DIALYSIS/PERMA CATHETER INSERTION;  Surgeon:               Katha Cabal, MD;  Location: [place] [facility];               Service: Cardiovascular;  Laterality: N/A; 06/02/2022: DIALYSIS/PERMA CATHETER REMOVAL; N/A     Comment:  Procedure: DIALYSIS/PERMA  CATHETER REMOVAL;  Surgeon:               Katha Cabal, MD;  Location: [place] [facility];               Service: Cardiovascular;  Laterality: N/A; No date: INSERTION OF DIALYSIS CATHETER; Right     Comment:  ARM 01/2011: Nexplanon 7/32/2025: UMBILICAL HERNIA REPAIR; N/A     Comment:  Procedure: HERNIA REPAIR UMBILICAL ADULT;  Surgeon:               Olean Ree, MD;  Location: [facility] ORS;  Service:               General;  Laterality: N/A;  BMI    Body Mass Index: 26.08 kg/m      Reproductive/Obstetrics negative OB ROS                             Anesthesia Physical Anesthesia Plan  ASA: 3  Anesthesia Plan: General ETT   Post-op Pain Management:    Induction: Intravenous  PONV Risk Score and Plan: Ondansetron, Dexamethasone, Midazolam and Treatment may vary due to age or medical condition  Airway Management Planned: Oral ETT  Additional Equipment:   Intra-op Plan:  Post-operative Plan: Extubation in OR  Informed Consent: I have reviewed the patients History and Physical, chart, labs and discussed the procedure including the risks, benefits and alternatives for the proposed anesthesia with the patient or authorized representative who has indicated his/her understanding and acceptance.     Dental Advisory Given  Plan Discussed with: Anesthesiologist, CRNA and Surgeon  Anesthesia Plan Comments: (Patient consented for risks of anesthesia including but not limited to:  - adverse reactions to medications - damage to eyes, teeth, lips or other oral mucosa - nerve damage due to positioning  - sore throat or hoarseness - Damage to heart, brain, nerves, lungs, other parts of body or loss of life  Patient voiced understanding.)        Anesthesia Quick Evaluation

## 2022-07-17 ENCOUNTER — Encounter: Payer: Self-pay | Admitting: Surgery

## 2022-07-17 NOTE — Transfer of Care (Signed)
Immediate Anesthesia Transfer of Care Note  Patient: Emma Thomas  Procedure(s) Performed: LAPAROSCOPIC REVISION CONTINUOUS AMBULATORY PERITONEAL DIALYSIS  (CAPD) CATHETER (Abdomen)  Patient Location: PACU  Anesthesia Type:General  Level of Consciousness: awake, alert  and oriented  Airway & Oxygen Therapy: Patient Spontanous Breathing  Post-op Assessment: Report given to RN and Post -op Vital signs reviewed and stable  Post vital signs: Reviewed and stable  Last Vitals:  Vitals Value Taken Time  BP 119/76 07/16/22 1609  Temp 36.6 C 07/16/22 1609  Pulse 92 07/16/22 1609  Resp 15 07/16/22 1609  SpO2 100 % 07/16/22 1609    Last Pain:  Vitals:   07/16/22 1609  TempSrc: Temporal  PainSc: 0-No pain      Patients Stated Pain Goal: 3 (01/60/10 9323)  Complications: No notable events documented.

## 2022-07-17 NOTE — Anesthesia Postprocedure Evaluation (Signed)
Anesthesia Post Note  Patient: Emma Thomas  Procedure(s) Performed: LAPAROSCOPIC REVISION CONTINUOUS AMBULATORY PERITONEAL DIALYSIS  (CAPD) CATHETER (Abdomen)  Patient location during evaluation: PACU Anesthesia Type: General Level of consciousness: awake and alert, oriented and patient cooperative Pain management: pain level controlled Vital Signs Assessment: post-procedure vital signs reviewed and stable Respiratory status: spontaneous breathing, nonlabored ventilation and respiratory function stable Cardiovascular status: blood pressure returned to baseline and stable Postop Assessment: adequate PO intake Anesthetic complications: no   No notable events documented.   Last Vitals:  Vitals:   07/16/22 1600 07/16/22 1609  BP: 109/65 119/76  Pulse: 80 92  Resp: 14 15  Temp: (!) 36.1 C 36.6 C  SpO2: 95% 100%    Last Pain:  Vitals:   07/16/22 1609  TempSrc: Temporal  PainSc: 0-No pain                 Darrin Nipper

## 2022-07-27 ENCOUNTER — Ambulatory Visit: Payer: 59 | Admitting: Surgery

## 2022-07-30 ENCOUNTER — Encounter: Payer: Commercial Managed Care - PPO | Admitting: Physician Assistant

## 2022-08-04 ENCOUNTER — Encounter: Payer: Self-pay | Admitting: Physician Assistant

## 2022-08-04 ENCOUNTER — Ambulatory Visit (INDEPENDENT_AMBULATORY_CARE_PROVIDER_SITE_OTHER): Payer: Commercial Managed Care - PPO | Admitting: Physician Assistant

## 2022-08-04 VITALS — BP 126/83 | HR 97 | Temp 98.8°F | Ht 66.0 in | Wt 155.0 lb

## 2022-08-04 DIAGNOSIS — T85611A Breakdown (mechanical) of intraperitoneal dialysis catheter, initial encounter: Secondary | ICD-10-CM

## 2022-08-04 DIAGNOSIS — N186 End stage renal disease: Secondary | ICD-10-CM

## 2022-08-04 DIAGNOSIS — Z992 Dependence on renal dialysis: Secondary | ICD-10-CM

## 2022-08-04 DIAGNOSIS — T85611D Breakdown (mechanical) of intraperitoneal dialysis catheter, subsequent encounter: Secondary | ICD-10-CM

## 2022-08-04 DIAGNOSIS — Z09 Encounter for follow-up examination after completed treatment for conditions other than malignant neoplasm: Secondary | ICD-10-CM

## 2022-08-04 NOTE — Progress Notes (Addendum)
[facility] POST-OP OFFICE VISIT  08/04/2022  HPI: Emma Thomas is a 26 y.o. female 19 days s/p peritoneal dialysis catheter revision with Dr Kirke Corin   She is doing well Intermittent crampy abdominal pain but otherwise doing well No fever, chills, nausea, emesis.  PD catheter is functioning without issues No other complaints   Vital signs: BP 126/83   Pulse 97   Temp 98.8 F (37.1 C)   Ht 5\' 6"  (1.676 m)   Wt 155 lb (70.3 kg)   LMP 08/03/2022 (Exact Date)   SpO2 98%   BMI 25.02 kg/m    Physical Exam: Constitutional: Well appearing female, NAD Abdomen: Soft, non-tender, non-distended, no rebound/guarding. PD catheter in right abdomen; site CDI Skin: Laparoscopic incisions are healing well, no erythema or drainage   Assessment/Plan: This is a 26 y.o. female peritoneal dialysis catheter revision with Dr Kirke Corin    - Pain control prn  - Continue PD  - She can follow up on as needed basis; She understands to call with questions/concerns  -- Emma Simon, PA-C [facility] 08/04/2022, 3:26 PM M-F: 7am - 4pm

## 2022-08-04 NOTE — Patient Instructions (Signed)
Peritoneal Dialysis Catheter Placement, Care After The following information offers guidance on how to care for yourself after your procedure. Your health care provider may also give you more specific instructions. If you have problems or questions, contact your health care provider. What can I expect after the procedure? After the procedure, it is common to have some pain or discomfort in your abdomen and your incision area. You may need to wait 2 weeks after your procedure before you can start peritoneal dialysis treatment. If you need dialysis before that time, your health care provider may begin peritoneal dialysis treatment early or offer kidney dialysis treatments (hemodialysis) until you heal. Follow these instructions at home: Incision care  Follow instructions from your health care provider about how to take care of your incision or incisions. Make sure you: Wash your hands with soap and water for at least 20 seconds before and after you change your bandage (dressing). If soap and water are not available, use hand sanitizer. Change your dressing only as told by your health care provider. Your health care provider may tell you not to touch or change your dressing. Leave stitches (sutures), staples, skin glue, or adhesive strips in place. These skin closures may need to stay in place for 2 weeks or longer. If adhesive strip edges start to loosen and curl up, you may trim the loose edges. Do not remove adhesive strips completely unless your health care provider tells you to do that. Check your incision areas every day for signs of infection. If you were instructed not to touch or change your dressing, look at your dressing for signs of infection. Check for: Redness, swelling, or more pain. Fluid or blood. Warmth. Pus or a bad smell. Medicines Take over-the-counter and prescription medicines only as told by your health care provider. If you were prescribed an antibiotic medicine, use it as  told by your health care provider. Do not stop using the antibiotic even if you start to feel better. Ask your health care provider if the medicine prescribed to you requires you to avoid driving or using machinery. Driving Do not drive or ride in a car until your health care provider approves. Your seat belt could move the catheter out of position or cause irritation by rubbing on your incision. Activity  Rest and limit your activity. Do not lift anything that is heavier than 10 lb (4.5 kg), or the limit that you are told, until your health care provider says that it is safe. Return to your normal activities as told by your health care provider. Ask your health care provider what activities are safe for you. Managing constipation Your condition may cause constipation. To prevent or treat constipation, you may need to: Drink enough fluid to keep your urine pale yellow. Take over-the-counter or prescription medicines. Eat foods that are high in fiber, such as beans, whole grains, and fresh fruits and vegetables. Limit foods that are high in fat and processed sugars, such as fried or sweet foods. General instructions Do not use any products that contain nicotine or tobacco. These products include cigarettes, chewing tobacco, and vaping devices, such as e-cigarettes. If you need help quitting, ask your health care provider. Follow instructions from your health care provider about eating or drinking restrictions. Do not take baths, swim, or use a hot tub until your health care provider approves. Ask your health care provider if you may take showers. You may only be allowed to take sponge baths. Wear loose-fitting clothing that keeps   the catheter covered so that it cannot get caught on something. Keep your catheter clean and dry. Keep all follow-up visits. This is important. Contact a health care provider if: You have a fever or chills. You have warmth, redness, swelling, or more pain around an  incision. You have fluid or blood coming from an incision. You have pus or a bad smell coming from an incision. You cannot eat or drink without vomiting. Get help right away if: You have problems breathing. You are confused. You have trouble speaking. You have severe pain in your abdomen that does not get better with treatment. You have bright red blood in your stool (feces), or your stool is dark black and looks like tar. These symptoms may represent a serious problem that is an emergency. Do not wait to see if the symptoms will go away. Get medical help right away. Call your local emergency services (911 in the [place] Do not drive yourself to the hospital. Summary After the procedure, it is common to have some pain or discomfort in your abdomen, your incision area, or both. You may have to wait 2 weeks after your procedure before you can start peritoneal dialysis treatment. Check your incision area every day for signs of infection. Get medical help right away if you have severe pain in your abdomen that does not get better with treatment. This information is not intended to replace advice given to you by your health care provider. Make sure you discuss any questions you have with your health care provider. Document Revised: 06/20/2020 Document Reviewed: 06/20/2020 Elsevier Patient Education  2023 [employer] 

## 2022-08-10 ENCOUNTER — Emergency Department: Payer: Medicaid Other

## 2022-08-10 ENCOUNTER — Emergency Department
Admission: EM | Admit: 2022-08-10 | Discharge: 2022-08-10 | Disposition: A | Discharge status: Home / Self Care | Payer: Medicaid Other | Attending: Emergency Medicine | Admitting: Emergency Medicine

## 2022-08-10 ENCOUNTER — Emergency Department (HOSPITAL_BASED_OUTPATIENT_CLINIC_OR_DEPARTMENT_OTHER)
Admit: 2022-08-10 | Discharge: 2022-08-10 | Disposition: A | Discharge status: Home / Self Care | Payer: Medicaid Other | Attending: Emergency Medicine | Admitting: Emergency Medicine

## 2022-08-10 ENCOUNTER — Other Ambulatory Visit: Payer: Self-pay

## 2022-08-10 DIAGNOSIS — I309 Acute pericarditis, unspecified: Secondary | ICD-10-CM | POA: Insufficient documentation

## 2022-08-10 DIAGNOSIS — E109 Type 1 diabetes mellitus without complications: Secondary | ICD-10-CM | POA: Insufficient documentation

## 2022-08-10 DIAGNOSIS — R9431 Abnormal electrocardiogram [ECG] [EKG]: Secondary | ICD-10-CM

## 2022-08-10 DIAGNOSIS — R079 Chest pain, unspecified: Secondary | ICD-10-CM | POA: Diagnosis present

## 2022-08-10 LAB — CBC WITH DIFFERENTIAL/PLATELET
Abs Immature Granulocytes: 0.01 10*3/uL (ref 0.00–0.07)
Basophils Absolute: 0.1 10*3/uL (ref 0.0–0.1)
Basophils Relative: 1 %
Eosinophils Absolute: 0.2 10*3/uL (ref 0.0–0.5)
Eosinophils Relative: 3 %
HCT: 34.3 % — ABNORMAL LOW (ref 36.0–46.0)
Hemoglobin: 11.2 g/dL — ABNORMAL LOW (ref 12.0–15.0)
Immature Granulocytes: 0 %
Lymphocytes Relative: 23 %
Lymphs Abs: 1.4 10*3/uL (ref 0.7–4.0)
MCH: 28 pg (ref 26.0–34.0)
MCHC: 32.7 g/dL (ref 30.0–36.0)
MCV: 85.8 fL (ref 80.0–100.0)
Monocytes Absolute: 0.4 10*3/uL (ref 0.1–1.0)
Monocytes Relative: 6 %
Neutro Abs: 4.1 10*3/uL (ref 1.7–7.7)
Neutrophils Relative %: 67 %
Platelets: 314 10*3/uL (ref 150–400)
RBC: 4 MIL/uL (ref 3.87–5.11)
RDW: 12.4 % (ref 11.5–15.5)
WBC: 6.2 10*3/uL (ref 4.0–10.5)
nRBC: 0 % (ref 0.0–0.2)

## 2022-08-10 LAB — TROPONIN I (HIGH SENSITIVITY)
Troponin I (High Sensitivity): 4 ng/L (ref <18)
Troponin I (High Sensitivity): 4 ng/L (ref <18)

## 2022-08-10 LAB — ECHOCARDIOGRAM COMPLETE
AR max vel: 3.08 cm2
AV Area VTI: 3.46 cm2
AV Area mean vel: 3.02 cm2
AV Mean grad: 4 mmHg
AV Peak grad: 6.6 mmHg
Ao pk vel: 1.28 m/s
Area-P 1/2: 5.31 cm2
Height: 66 in
S' Lateral: 2.6 cm
Weight: 2432 oz

## 2022-08-10 LAB — COMPREHENSIVE METABOLIC PANEL
ALT: 27 U/L (ref 0–44)
AST: 35 U/L (ref 15–41)
Albumin: 3.2 g/dL — ABNORMAL LOW (ref 3.5–5.0)
Alkaline Phosphatase: 76 U/L (ref 38–126)
Anion gap: 14 (ref 5–15)
BUN: 41 mg/dL — ABNORMAL HIGH (ref 6–20)
CO2: 18 mmol/L — ABNORMAL LOW (ref 22–32)
Calcium: 7.9 mg/dL — ABNORMAL LOW (ref 8.9–10.3)
Chloride: 102 mmol/L (ref 98–111)
Creatinine, Ser: 7.27 mg/dL — ABNORMAL HIGH (ref 0.44–1.00)
GFR, Estimated: 7 mL/min — ABNORMAL LOW (ref >60)
Glucose, Bld: 401 mg/dL — ABNORMAL HIGH (ref 70–99)
Potassium: 4.2 mmol/L (ref 3.5–5.1)
Sodium: 134 mmol/L — ABNORMAL LOW (ref 135–145)
Total Bilirubin: 0.9 mg/dL (ref 0.3–1.2)
Total Protein: 7.5 g/dL (ref 6.5–8.1)

## 2022-08-10 LAB — POC URINE PREG, ED: Preg Test, Ur: NEGATIVE

## 2022-08-10 LAB — SEDIMENTATION RATE: Sed Rate: 81 mm/hr — ABNORMAL HIGH (ref 0–20)

## 2022-08-10 MED ORDER — PREDNISONE 20 MG PO TABS: 20.0000 mg | ORAL_TABLET | Freq: Every day | ORAL | 0 refills | Status: DC

## 2022-08-10 NOTE — ED Triage Notes (Signed)
Pt presents to ED via POV with c/o of upper CP that started 1 week ago, pt thought it was gas but states she does not think it is anymore at this time.   Pt is dialysis and does PD at home.

## 2022-08-10 NOTE — ED Notes (Signed)
Echo at bedside

## 2022-08-10 NOTE — Progress Notes (Signed)
*  PRELIMINARY RESULTS* Echocardiogram 2D Echocardiogram has been performed.  Emma Thomas 08/10/2022, 11:49 AM

## 2022-08-10 NOTE — Discharge Instructions (Addendum)
It looks like you have pericarditis or inflammation around the heart.  Heart itself looks good.  I discussed your case with Dr. Fletcher Anon the cardiologist and Dr. Candiss Norse the renal doctor.  We will give you prednisone 20 mg once a day and Motrin 200 mg or 1 over-the-counter pill twice a day with food.  This should help with the pain fairly quickly.  If you develop a fever or worse pain or shortness of breath or any other problems please return immediately, even if it is 3:00 in the morning.  Please make sure you are taking the medications both the prednisone and the Motrin with something on your stomach to help prevent any stomach irritation.  Please follow-up with both the peritoneal dialysis providers and Dr. Fletcher Anon.  You can give the office a call in 2 or 3 days if they have not called you already.  You may end up getting a different doctor for the cardiology follow-up.

## 2022-08-10 NOTE — Inpatient Diabetes Management (Signed)
Inpatient Diabetes Program Recommendations  AACE/ADA: New Consensus Statement on Inpatient Glycemic Control (2015)  Target Ranges:  Prepandial:   less than 140 mg/dL      Peak postprandial:   less than 180 mg/dL (1-2 hours)      Critically ill patients:  140 - 180 mg/dL   Lab Results  Component Value Date   GLUCAP 163 (H) 07/16/2022   HGBA1C 9.4 (H) 04/19/2022    Review of Glycemic Control  Diabetes history: DM1 (makes no insulin, needs basal, meal coverage & correction) Outpatient Diabetes medications: Tandem insulin pump with Dexcom 270 CGM Current orders for Inpatient glycemic control: Tamdem insulin pump  Inpatient Diabetes Program Recommendations:   Spoke with patient @ bedside in ED. Patient just changed her insulin pump site this morning. Per endocrinology note from tele visit [place] on 08/04/22 "- discussed off pump plan today: 14 u lantus in AM and 1:10 IC ratio plus SSI as 1 u for every 50 >150" Uses Tandem insulin pump with Humalog insulin total daily dose of 40 units. Carbohydrate ratio of 1 unit to 8 units. Up to 30 units daily.  Discussed hyperglycemia in am with patient. Patient states she gets so hot while on peritoneal dialysis during the night she sometimes takes her insulin pump off due to no where to clip her pump. Discussed option of attaching pump around her upper arm to hold insulin pump during the night. Patient states she stays very busy with her 28 year old daughter along with her health care. If patient admitted, will need insulin pump order set.  Thank you, Nani Gasser. Mittie Knittel, RN, MSN, CDE  Diabetes Coordinator Inpatient Glycemic Control Team Team Pager [phone] (8am-5pm) 08/10/2022 1:17 PM

## 2022-08-10 NOTE — Progress Notes (Signed)
[facility]  ROUNDING NOTE   Subjective:   Emma Thomas is a 26 year old female with past medical conditions including hypertension, diabetes, anemia, and end-stage renal disease on peritoneal dialysis.  Patient presents to the emergency department complaining of chest pain with shortness of breath.  Patient states pain is worse when lying down, reports decreases and diminishes with sitting up.  Shortness of breath also improves when sitting up.  Patient denies known fever or chills.  States the symptoms began this morning, normal state of health yesterday.  Patient states she has continued nightly peritoneal dialysis treatments without complications.  Labs on ED arrival include sodium 134, serum bicarb 18, glucose 4 1, BUN 41, creatinine 7.27 with GFR 7, and hemoglobin 11.2.  Chest x-ray negative for acute findings.   Objective:  Vital signs in last 24 hours:  Temp:  [97.9 F (36.6 C)] 97.9 F (36.6 C) (09/25 0917) Pulse Rate:  [96-105] 97 (09/25 1300) Resp:  [8-20] 8 (09/25 1300) BP: (124-136)/(84-90) 124/89 (09/25 1300) SpO2:  [97 %-100 %] 100 % (09/25 1300) Weight:  [68.9 kg] 68.9 kg (09/25 0919)  Weight change:  Filed Weights   08/10/22 0919  Weight: 68.9 kg    Intake/Output: No intake/output data recorded.   Intake/Output this shift:  No intake/output data recorded.  Physical Exam: General: NAD, sitting up on stretcher  Head: Normocephalic, atraumatic. Moist oral mucosal membranes  Eyes: Anicteric  Lungs:  Clear to auscultation, normal effort, room air  Heart: Regular rate and rhythm  Abdomen:  Soft, nontender  Extremities: 1+ peripheral edema.  Neurologic: Nonfocal, moving all four extremities  Skin: No lesions  Access: PD catheter    Basic Metabolic Panel: Recent Labs  Lab 08/10/22 0934  NA 134*  K 4.2  CL 102  CO2 18*  GLUCOSE 401*  BUN 41*  CREATININE 7.27*  CALCIUM 7.9*    Liver Function Tests: Recent Labs  Lab 08/10/22 0934   AST 35  ALT 27  ALKPHOS 76  BILITOT 0.9  PROT 7.5  ALBUMIN 3.2*   No results for input(s): "LIPASE", "AMYLASE" in the last 168 hours. No results for input(s): "AMMONIA" in the last 168 hours.  CBC: Recent Labs  Lab 08/10/22 0934  WBC 6.2  NEUTROABS 4.1  HGB 11.2*  HCT 34.3*  MCV 85.8  PLT 314    Cardiac Enzymes: No results for input(s): "CKTOTAL", "CKMB", "CKMBINDEX", "TROPONINI" in the last 168 hours.  BNP: Invalid input(s): "POCBNP"  CBG: No results for input(s): "GLUCAP" in the last 168 hours.  Microbiology: Results for orders placed or performed during the hospital encounter of 06/02/22  Blood culture (routine x 2)     Status: None   Collection Time: 06/02/22 12:06 PM   Specimen: BLOOD  Result Value Ref Range Status   Specimen Description BLOOD LEFT AC  Final   Special Requests   Final    BOTTLES DRAWN AEROBIC AND ANAEROBIC Blood Culture results may not be optimal due to an inadequate volume of blood received in culture bottles   Culture   Final    NO GROWTH 5 DAYS Performed at [facility], [address], [place], [place] [address]    Report Status 06/07/2022 FINAL  Final    Coagulation Studies: No results for input(s): "LABPROT", "INR" in the last 72 hours.  Urinalysis: No results for input(s): "COLORURINE", "LABSPEC", "PHURINE", "GLUCOSEU", "HGBUR", "BILIRUBINUR", "KETONESUR", "PROTEINUR", "UROBILINOGEN", "NITRITE", "LEUKOCYTESUR" in the last 72 hours.  Invalid input(s): "APPERANCEUR"  Imaging: DG Chest Portable 1 View  Result Date: 08/10/2022 CLINICAL DATA:  Pt presents to ED via POV with c/o of upper CP that started 1 week ago, pt thought it was gas but states she does not think it is anymore at this time. Pt is dialysis and does PD at home.CP EXAM: PORTABLE CHEST 1 VIEW COMPARISON:  None Available. FINDINGS: Normal mediastinum and cardiac silhouette. Normal pulmonary vasculature. No evidence of effusion, infiltrate, or pneumothorax.  No acute bony abnormality. IMPRESSION: Normal chest radiograph. Electronically Signed   By: Suzy Bouchard M.D.   On: 08/10/2022 09:46     Medications:       Assessment/ Plan:  Emma Thomas is a 26 y.o.  female past medical conditions including hypertension, diabetes, anemia, and end-stage renal disease on peritoneal dialysis.  Patient presents to the emergency department complaining of chest pain with shortness of breath.   [facility]-PD  Hyponatremia with end-stage renal disease on peritoneal dialysis.  Sodium 134 on ED arrival.  This should correct with continued dialysis.  2.  Acute pericarditis.  Patient currently receives nightly peritoneal dialysis treatments, 4 cycles with 2100 mL fill.  May recommend extraneal daily dwell.  Patient will be discharged on prednisone 20 mg daily with low-dose ibuprofen for management.  3. Anemia of chronic kidney disease Lab Results  Component Value Date   HGB 11.2 (L) 08/10/2022    Hemoglobin within desired target.  We will continue to monitor  4. Secondary Hyperparathyroidism:   Lab Results  Component Value Date   CALCIUM 7.9 (L) 08/10/2022   CAION 0.89 (LL) 07/16/2022   PHOS 3.9 04/27/2022  Calcium slightly decreased.  We will continue to monitor   LOS: 0   9/25/20231:39 PM

## 2022-08-10 NOTE — ED Provider Notes (Signed)
[facility] Provider Note    Event Date/Time   First MD Initiated Contact with Patient 08/10/22 [phone]     (approximate)   History   Chest Pain   HPI  Emma Thomas is a 26 y.o. female type I diabetic who gets peritoneal dialysis who reports pain in the upper chest that is worse with deep breathing and worse when she lays back.  It happened last week lasted a few days and went away and then came back again yesterday.  She reports if she leans forward the pain goes away completely and she can breathe without difficulty.  She is not having a cough or fever.  The shortness of breath she is feeling is from the pain not because she cannot get enough air.      Physical Exam   Triage Vital Signs: ED Triage Vitals  Enc Vitals Group     BP 08/10/22 0920 128/89     Pulse Rate 08/10/22 0917 (!) 105     Resp 08/10/22 0917 18     Temp 08/10/22 0917 97.9 F (36.6 C)     Temp Source 08/10/22 0917 Oral     SpO2 08/10/22 0917 97 %     Weight 08/10/22 0919 152 lb (68.9 kg)     Height 08/10/22 0919 5\' 6"  (1.676 m)     Head Circumference --      Peak Flow --      Pain Score 08/10/22 0919 8     Pain Loc --      Pain Edu? --      Excl. in [place]? --     Most recent vital signs: Vitals:   08/10/22 0920 08/10/22 1130  BP: 128/89 (!) 136/90  Pulse:  (!) 102  Resp:  12  Temp:    SpO2:  100%    General: Awake, no distress.  CV:  Good peripheral perfusion.  Heart regular rate and rhythm no audible murmurs Resp:  Normal effort.  Lungs are clear Abd:  No distention.  Soft and nontender Extremities: Trace edema bilaterally patient reports this is common for her.   ED Results / Procedures / Treatments   Labs (all labs ordered are listed, but only abnormal results are displayed) Labs Reviewed  CBC WITH DIFFERENTIAL/PLATELET - Abnormal; Notable for the following components:      Result Value   Hemoglobin 11.2 (*)    HCT 34.3 (*)    All other components within  normal limits  COMPREHENSIVE METABOLIC PANEL - Abnormal; Notable for the following components:   Sodium 134 (*)    CO2 18 (*)    Glucose, Bld 401 (*)    BUN 41 (*)    Creatinine, Ser 7.27 (*)    Calcium 7.9 (*)    Albumin 3.2 (*)    GFR, Estimated 7 (*)    All other components within normal limits  SEDIMENTATION RATE - Abnormal; Notable for the following components:   Sed Rate 81 (*)    All other components within normal limits  POC URINE PREG, ED  TROPONIN I (HIGH SENSITIVITY)  TROPONIN I (HIGH SENSITIVITY)     EKG  EKG read and interpreted by me shows sinus tach at a rate of 110 normal axis some PR segment depression inferiorly in 2 3 and F.   RADIOLOGY X-ray read and interpreted by me.  I do not see any acute changes.  Await for the radiologist report.   PROCEDURES:  Critical  Care performed:   Procedures   MEDICATIONS ORDERED IN ED: Medications - No data to display   IMPRESSION / MDM / [place] / ED COURSE  I reviewed the triage vital signs and the nursing notes. Cardiac echo does not show any massive effusion or difficulty with the heart contracting per my reading.  I discussed the patient with Dr. Velva Harman who feels it should be fine to give her 120 a day for a week or 2 and Motrin 200 twice a day for about a week.  I discussed her with Dr. Candiss Norse as well.  He agrees with this management completely.  We will make sure she takes the medications with something on her stomach to avoid ulcer.  Dr. Candiss Norse will try to get the dialysis frequency increased somewhat.  Differential diagnosis includes, but is not limited to, pericarditis fits the picture quite nicely.  Pulmonary embolus should not get better when she leans forward and should result in other changes which I am not seeing.  MI is a possibility but that would not give her pleuritic pain that improved when she sat forward.  Additionally her troponin is normal.  Patient's presentation is most consistent with  acute complicated illness / injury requiring diagnostic workup.  The patient is on the cardiac monitor to evaluate for evidence of arrhythmia and/or significant heart rate changes.  None have been seen    FINAL CLINICAL IMPRESSION(S) / ED DIAGNOSES   Final diagnoses:  Acute pericarditis, unspecified type     Rx / DC Orders   ED Discharge Orders          Ordered    predniSONE (DELTASONE) 20 MG tablet  Daily with breakfast        08/10/22 1317    Ambulatory referral to Cardiology       Comments: If you have not heard from the Cardiology office within the next 72 hours please call [phone].   08/10/22 1317             Note:  This document was prepared using Dragon voice recognition software and may include unintentional dictation errors.   Nena Polio, MD 08/10/22 1318

## 2022-08-31 DIAGNOSIS — I5189 Other ill-defined heart diseases: Secondary | ICD-10-CM | POA: Insufficient documentation

## 2022-08-31 DIAGNOSIS — I300 Acute nonspecific idiopathic pericarditis: Secondary | ICD-10-CM | POA: Insufficient documentation

## 2022-09-16 ENCOUNTER — Other Ambulatory Visit: Payer: Self-pay | Admitting: Surgery

## 2022-09-29 ENCOUNTER — Ambulatory Visit: Payer: Medicaid Other | Admitting: Cardiology

## 2022-11-20 ENCOUNTER — Other Ambulatory Visit: Payer: Self-pay

## 2022-11-20 ENCOUNTER — Emergency Department: Payer: Medicaid Other

## 2022-11-20 ENCOUNTER — Encounter: Payer: Self-pay | Admitting: Emergency Medicine

## 2022-11-20 ENCOUNTER — Inpatient Hospital Stay
Admission: EM | Admit: 2022-11-20 | Discharge: 2022-11-28 | DRG: 617 | Disposition: A | Discharge status: Home Health | Payer: Medicaid Other | Attending: Internal Medicine | Admitting: Internal Medicine

## 2022-11-20 DIAGNOSIS — L97529 Non-pressure chronic ulcer of other part of left foot with unspecified severity: Secondary | ICD-10-CM | POA: Diagnosis present

## 2022-11-20 DIAGNOSIS — E1122 Type 2 diabetes mellitus with diabetic chronic kidney disease: Secondary | ICD-10-CM | POA: Diagnosis not present

## 2022-11-20 DIAGNOSIS — E1069 Type 1 diabetes mellitus with other specified complication: Principal | ICD-10-CM | POA: Diagnosis present

## 2022-11-20 DIAGNOSIS — L02612 Cutaneous abscess of left foot: Secondary | ICD-10-CM | POA: Diagnosis present

## 2022-11-20 DIAGNOSIS — M8610 Other acute osteomyelitis, unspecified site: Secondary | ICD-10-CM | POA: Diagnosis not present

## 2022-11-20 DIAGNOSIS — Z538 Procedure and treatment not carried out for other reasons: Secondary | ICD-10-CM | POA: Diagnosis not present

## 2022-11-20 DIAGNOSIS — K3184 Gastroparesis: Secondary | ICD-10-CM | POA: Diagnosis present

## 2022-11-20 DIAGNOSIS — E1022 Type 1 diabetes mellitus with diabetic chronic kidney disease: Secondary | ICD-10-CM

## 2022-11-20 DIAGNOSIS — D638 Anemia in other chronic diseases classified elsewhere: Secondary | ICD-10-CM | POA: Insufficient documentation

## 2022-11-20 DIAGNOSIS — Z79899 Other long term (current) drug therapy: Secondary | ICD-10-CM

## 2022-11-20 DIAGNOSIS — Y812 Prosthetic and other implants, materials and accessory general- and plastic-surgery devices associated with adverse incidents: Secondary | ICD-10-CM | POA: Diagnosis not present

## 2022-11-20 DIAGNOSIS — Z833 Family history of diabetes mellitus: Secondary | ICD-10-CM

## 2022-11-20 DIAGNOSIS — E876 Hypokalemia: Secondary | ICD-10-CM | POA: Diagnosis present

## 2022-11-20 DIAGNOSIS — N186 End stage renal disease: Secondary | ICD-10-CM

## 2022-11-20 DIAGNOSIS — D631 Anemia in chronic kidney disease: Secondary | ICD-10-CM | POA: Diagnosis present

## 2022-11-20 DIAGNOSIS — Z992 Dependence on renal dialysis: Secondary | ICD-10-CM

## 2022-11-20 DIAGNOSIS — E10628 Type 1 diabetes mellitus with other skin complications: Secondary | ICD-10-CM | POA: Diagnosis present

## 2022-11-20 DIAGNOSIS — E1065 Type 1 diabetes mellitus with hyperglycemia: Secondary | ICD-10-CM | POA: Insufficient documentation

## 2022-11-20 DIAGNOSIS — I120 Hypertensive chronic kidney disease with stage 5 chronic kidney disease or end stage renal disease: Secondary | ICD-10-CM | POA: Diagnosis present

## 2022-11-20 DIAGNOSIS — Z803 Family history of malignant neoplasm of breast: Secondary | ICD-10-CM | POA: Diagnosis not present

## 2022-11-20 DIAGNOSIS — E8770 Fluid overload, unspecified: Secondary | ICD-10-CM | POA: Diagnosis not present

## 2022-11-20 DIAGNOSIS — M869 Osteomyelitis, unspecified: Principal | ICD-10-CM

## 2022-11-20 DIAGNOSIS — E10621 Type 1 diabetes mellitus with foot ulcer: Secondary | ICD-10-CM | POA: Diagnosis present

## 2022-11-20 DIAGNOSIS — T85611A Breakdown (mechanical) of intraperitoneal dialysis catheter, initial encounter: Secondary | ICD-10-CM | POA: Diagnosis present

## 2022-11-20 DIAGNOSIS — E11628 Type 2 diabetes mellitus with other skin complications: Secondary | ICD-10-CM | POA: Diagnosis not present

## 2022-11-20 DIAGNOSIS — R601 Generalized edema: Secondary | ICD-10-CM | POA: Diagnosis not present

## 2022-11-20 DIAGNOSIS — Z794 Long term (current) use of insulin: Secondary | ICD-10-CM

## 2022-11-20 DIAGNOSIS — Z801 Family history of malignant neoplasm of trachea, bronchus and lung: Secondary | ICD-10-CM | POA: Diagnosis not present

## 2022-11-20 DIAGNOSIS — N2581 Secondary hyperparathyroidism of renal origin: Secondary | ICD-10-CM | POA: Diagnosis present

## 2022-11-20 DIAGNOSIS — B9562 Methicillin resistant Staphylococcus aureus infection as the cause of diseases classified elsewhere: Secondary | ICD-10-CM | POA: Diagnosis present

## 2022-11-20 DIAGNOSIS — T85611S Breakdown (mechanical) of intraperitoneal dialysis catheter, sequela: Secondary | ICD-10-CM | POA: Diagnosis not present

## 2022-11-20 DIAGNOSIS — R188 Other ascites: Secondary | ICD-10-CM | POA: Diagnosis present

## 2022-11-20 DIAGNOSIS — E1169 Type 2 diabetes mellitus with other specified complication: Secondary | ICD-10-CM | POA: Diagnosis not present

## 2022-11-20 DIAGNOSIS — M86172 Other acute osteomyelitis, left ankle and foot: Secondary | ICD-10-CM | POA: Diagnosis present

## 2022-11-20 DIAGNOSIS — E119 Type 2 diabetes mellitus without complications: Secondary | ICD-10-CM

## 2022-11-20 DIAGNOSIS — E871 Hypo-osmolality and hyponatremia: Secondary | ICD-10-CM | POA: Insufficient documentation

## 2022-11-20 DIAGNOSIS — E1043 Type 1 diabetes mellitus with diabetic autonomic (poly)neuropathy: Secondary | ICD-10-CM | POA: Diagnosis present

## 2022-11-20 DIAGNOSIS — R1110 Vomiting, unspecified: Secondary | ICD-10-CM | POA: Diagnosis present

## 2022-11-20 DIAGNOSIS — L089 Local infection of the skin and subcutaneous tissue, unspecified: Secondary | ICD-10-CM | POA: Diagnosis not present

## 2022-11-20 DIAGNOSIS — R112 Nausea with vomiting, unspecified: Secondary | ICD-10-CM | POA: Diagnosis not present

## 2022-11-20 DIAGNOSIS — Z87440 Personal history of urinary (tract) infections: Secondary | ICD-10-CM

## 2022-11-20 LAB — CBC WITH DIFFERENTIAL/PLATELET
Abs Immature Granulocytes: 0.1 10*3/uL — ABNORMAL HIGH (ref 0.00–0.07)
Basophils Absolute: 0.1 10*3/uL (ref 0.0–0.1)
Basophils Relative: 1 %
Eosinophils Absolute: 0.3 10*3/uL (ref 0.0–0.5)
Eosinophils Relative: 2 %
HCT: 27.6 % — ABNORMAL LOW (ref 36.0–46.0)
Hemoglobin: 8.6 g/dL — ABNORMAL LOW (ref 12.0–15.0)
Immature Granulocytes: 1 %
Lymphocytes Relative: 16 %
Lymphs Abs: 2.3 10*3/uL (ref 0.7–4.0)
MCH: 26.6 pg (ref 26.0–34.0)
MCHC: 31.2 g/dL (ref 30.0–36.0)
MCV: 85.4 fL (ref 80.0–100.0)
Monocytes Absolute: 0.9 10*3/uL (ref 0.1–1.0)
Monocytes Relative: 6 %
Neutro Abs: 11 10*3/uL — ABNORMAL HIGH (ref 1.7–7.7)
Neutrophils Relative %: 74 %
Platelets: 490 10*3/uL — ABNORMAL HIGH (ref 150–400)
RBC: 3.23 MIL/uL — ABNORMAL LOW (ref 3.87–5.11)
RDW: 11.9 % (ref 11.5–15.5)
WBC: 14.7 10*3/uL — ABNORMAL HIGH (ref 4.0–10.5)
nRBC: 0 % (ref 0.0–0.2)

## 2022-11-20 LAB — BASIC METABOLIC PANEL
Anion gap: 15 (ref 5–15)
BUN: 40 mg/dL — ABNORMAL HIGH (ref 6–20)
CO2: 22 mmol/L (ref 22–32)
Calcium: 7.4 mg/dL — ABNORMAL LOW (ref 8.9–10.3)
Chloride: 96 mmol/L — ABNORMAL LOW (ref 98–111)
Creatinine, Ser: 6.29 mg/dL — ABNORMAL HIGH (ref 0.44–1.00)
GFR, Estimated: 9 mL/min — ABNORMAL LOW (ref >60)
Glucose, Bld: 152 mg/dL — ABNORMAL HIGH (ref 70–99)
Potassium: 2.9 mmol/L — ABNORMAL LOW (ref 3.5–5.1)
Sodium: 133 mmol/L — ABNORMAL LOW (ref 135–145)

## 2022-11-20 LAB — COMPREHENSIVE METABOLIC PANEL
ALT: 63 U/L — ABNORMAL HIGH (ref 0–44)
AST: 74 U/L — ABNORMAL HIGH (ref 15–41)
Albumin: 2.2 g/dL — ABNORMAL LOW (ref 3.5–5.0)
Alkaline Phosphatase: 79 U/L (ref 38–126)
Anion gap: 12 (ref 5–15)
BUN: 36 mg/dL — ABNORMAL HIGH (ref 6–20)
CO2: 24 mmol/L (ref 22–32)
Calcium: 7.4 mg/dL — ABNORMAL LOW (ref 8.9–10.3)
Chloride: 96 mmol/L — ABNORMAL LOW (ref 98–111)
Creatinine, Ser: 5.91 mg/dL — ABNORMAL HIGH (ref 0.44–1.00)
GFR, Estimated: 9 mL/min — ABNORMAL LOW (ref >60)
Glucose, Bld: 162 mg/dL — ABNORMAL HIGH (ref 70–99)
Potassium: 2.5 mmol/L — CL (ref 3.5–5.1)
Sodium: 132 mmol/L — ABNORMAL LOW (ref 135–145)
Total Bilirubin: 0.9 mg/dL (ref 0.3–1.2)
Total Protein: 7.8 g/dL (ref 6.5–8.1)

## 2022-11-20 LAB — CBG MONITORING, ED
Glucose-Capillary: 145 mg/dL — ABNORMAL HIGH (ref 70–99)
Glucose-Capillary: 206 mg/dL — ABNORMAL HIGH (ref 70–99)

## 2022-11-20 LAB — LACTIC ACID, PLASMA: Lactic Acid, Venous: 0.8 mmol/L (ref 0.5–1.9)

## 2022-11-20 LAB — MAGNESIUM: Magnesium: 1.8 mg/dL (ref 1.7–2.4)

## 2022-11-20 MED ORDER — POTASSIUM CHLORIDE 20 MEQ PO PACK: 40.0000 meq | PACK | Freq: Two times a day (BID) | ORAL | Status: DC

## 2022-11-20 MED ORDER — MONTELUKAST SODIUM 10 MG PO TABS
10.0000 mg | ORAL_TABLET | Freq: Every day | ORAL | Status: DC
  Administered 2022-11-21 – 2022-11-27 (×8): 10 mg via ORAL
  Filled 2022-11-20 (×8): qty 1

## 2022-11-20 MED ORDER — METRONIDAZOLE 500 MG/100ML IV SOLN
500.0000 mg | Freq: Two times a day (BID) | INTRAVENOUS | Status: DC
  Administered 2022-11-20 – 2022-11-25 (×10): 500 mg via INTRAVENOUS
  Filled 2022-11-20 (×12): qty 100

## 2022-11-20 MED ORDER — DELFLEX-LC/1.5% DEXTROSE 344 MOSM/L IP SOLN: INTRAPERITONEAL | Status: DC

## 2022-11-20 MED ORDER — INSULIN ASPART 100 UNIT/ML IJ SOLN
3.0000 [IU] | Freq: Three times a day (TID) | INTRAMUSCULAR | Status: DC
  Administered 2022-11-21 – 2022-11-28 (×11): 3 [IU] via SUBCUTANEOUS
  Filled 2022-11-20 (×13): qty 1

## 2022-11-20 MED ORDER — POLYETHYLENE GLYCOL 3350 17 G PO PACK: 17.0000 g | PACK | Freq: Every day | ORAL | Status: DC | PRN

## 2022-11-20 MED ORDER — INSULIN ASPART 100 UNIT/ML IJ SOLN
0.0000 [IU] | Freq: Every day | INTRAMUSCULAR | Status: DC
  Administered 2022-11-27: 2 [IU] via SUBCUTANEOUS
  Filled 2022-11-20: qty 1

## 2022-11-20 MED ORDER — HEPARIN SODIUM (PORCINE) 5000 UNIT/ML IJ SOLN
5000.0000 [IU] | Freq: Three times a day (TID) | INTRAMUSCULAR | Status: AC
  Administered 2022-11-21 – 2022-11-23 (×8): 5000 [IU] via SUBCUTANEOUS
  Filled 2022-11-20 (×7): qty 1

## 2022-11-20 MED ORDER — AMLODIPINE BESYLATE 10 MG PO TABS
10.0000 mg | ORAL_TABLET | Freq: Every day | ORAL | Status: DC
  Administered 2022-11-20 – 2022-11-28 (×7): 10 mg via ORAL
  Filled 2022-11-20 (×6): qty 1
  Filled 2022-11-20: qty 2

## 2022-11-20 MED ORDER — POTASSIUM CHLORIDE 20 MEQ PO PACK
40.0000 meq | PACK | Freq: Two times a day (BID) | ORAL | Status: AC
  Administered 2022-11-20 – 2022-11-21 (×2): 40 meq via ORAL
  Filled 2022-11-20 (×2): qty 2

## 2022-11-20 MED ORDER — POTASSIUM CHLORIDE 10 MEQ/100ML IV SOLN
10.0000 meq | Freq: Once | INTRAVENOUS | Status: AC
  Administered 2022-11-20: 10 meq via INTRAVENOUS
  Filled 2022-11-20: qty 100

## 2022-11-20 MED ORDER — ONDANSETRON HCL 4 MG PO TABS: 4.0000 mg | ORAL_TABLET | Freq: Four times a day (QID) | ORAL | Status: DC | PRN

## 2022-11-20 MED ORDER — LOSARTAN POTASSIUM 50 MG PO TABS
100.0000 mg | ORAL_TABLET | Freq: Every day | ORAL | Status: DC
  Administered 2022-11-20 – 2022-11-28 (×7): 100 mg via ORAL
  Filled 2022-11-20 (×7): qty 2

## 2022-11-20 MED ORDER — POTASSIUM CHLORIDE 20 MEQ PO PACK
40.0000 meq | PACK | Freq: Once | ORAL | Status: AC
  Administered 2022-11-20: 40 meq via ORAL
  Filled 2022-11-20: qty 2

## 2022-11-20 MED ORDER — ACETAMINOPHEN 325 MG PO TABS
650.0000 mg | ORAL_TABLET | Freq: Four times a day (QID) | ORAL | Status: DC | PRN
  Administered 2022-11-21: 650 mg via ORAL
  Filled 2022-11-20: qty 2

## 2022-11-20 MED ORDER — ADULT MULTIVITAMIN W/MINERALS CH
1.0000 | ORAL_TABLET | Freq: Every day | ORAL | Status: DC
  Administered 2022-11-20 – 2022-11-28 (×7): 1 via ORAL
  Filled 2022-11-20 (×7): qty 1

## 2022-11-20 MED ORDER — ACETAMINOPHEN 650 MG RE SUPP: 650.0000 mg | Freq: Four times a day (QID) | RECTAL | Status: DC | PRN

## 2022-11-20 MED ORDER — SODIUM CHLORIDE 0.9 % IV SOLN
2.0000 g | INTRAVENOUS | Status: DC
  Administered 2022-11-20 – 2022-11-25 (×5): 2 g via INTRAVENOUS
  Filled 2022-11-20: qty 2
  Filled 2022-11-20 (×2): qty 20
  Filled 2022-11-20 (×2): qty 2
  Filled 2022-11-20: qty 20

## 2022-11-20 MED ORDER — INSULIN GLARGINE-YFGN 100 UNIT/ML ~~LOC~~ SOLN
14.0000 [IU] | SUBCUTANEOUS | Status: DC
  Administered 2022-11-20: 14 [IU] via SUBCUTANEOUS
  Filled 2022-11-20 (×2): qty 0.14

## 2022-11-20 MED ORDER — BUMETANIDE 1 MG PO TABS
1.0000 mg | ORAL_TABLET | Freq: Two times a day (BID) | ORAL | Status: DC
  Administered 2022-11-20 – 2022-11-21 (×2): 1 mg via ORAL
  Filled 2022-11-20 (×3): qty 1

## 2022-11-20 MED ORDER — GENTAMICIN SULFATE 0.1 % EX CREA
1.0000 | TOPICAL_CREAM | Freq: Every day | CUTANEOUS | Status: DC
  Administered 2022-11-22 – 2022-11-26 (×2): 1 via TOPICAL
  Filled 2022-11-20 (×2): qty 15

## 2022-11-20 MED ORDER — ONDANSETRON HCL 4 MG/2ML IJ SOLN
4.0000 mg | Freq: Four times a day (QID) | INTRAMUSCULAR | Status: DC | PRN
  Administered 2022-11-21 – 2022-11-27 (×9): 4 mg via INTRAVENOUS
  Filled 2022-11-20 (×9): qty 2

## 2022-11-20 MED ORDER — INSULIN ASPART 100 UNIT/ML IJ SOLN: 0.0000 [IU] | Freq: Three times a day (TID) | INTRAMUSCULAR | Status: DC

## 2022-11-20 MED ORDER — INSULIN GLARGINE-YFGN 100 UNIT/ML ~~LOC~~ SOLN
30.0000 [IU] | Freq: Every day | SUBCUTANEOUS | Status: DC
  Filled 2022-11-20: qty 0.3

## 2022-11-20 MED ORDER — INSULIN ASPART 100 UNIT/ML IJ SOLN
0.0000 [IU] | Freq: Three times a day (TID) | INTRAMUSCULAR | Status: DC
  Administered 2022-11-20: 3 [IU] via SUBCUTANEOUS
  Administered 2022-11-21: 1 [IU] via SUBCUTANEOUS
  Administered 2022-11-22 (×2): 2 [IU] via SUBCUTANEOUS
  Administered 2022-11-24: 1 [IU] via SUBCUTANEOUS
  Administered 2022-11-25: 2 [IU] via SUBCUTANEOUS
  Administered 2022-11-26: 3 [IU] via SUBCUTANEOUS
  Administered 2022-11-26 – 2022-11-27 (×3): 2 [IU] via SUBCUTANEOUS
  Administered 2022-11-27: 3 [IU] via SUBCUTANEOUS
  Administered 2022-11-28: 5 [IU] via SUBCUTANEOUS
  Filled 2022-11-20 (×13): qty 1

## 2022-11-20 MED ORDER — VANCOMYCIN HCL 1250 MG/250ML IV SOLN
1250.0000 mg | Freq: Once | INTRAVENOUS | Status: AC
  Administered 2022-11-20: 1250 mg via INTRAVENOUS
  Filled 2022-11-20: qty 250

## 2022-11-20 NOTE — ED Triage Notes (Addendum)
Pt states that she was sent by her podiatrist for a surgical consult for the left big toe, possibly needing to be amputated.  Pt states they also sent her for an MRI of the left foot

## 2022-11-20 NOTE — ED Provider Notes (Signed)
[facility] Provider Note    Event Date/Time   First MD Initiated Contact with Patient 11/20/22 1243     (approximate)   History   Toe Injury (Left big toe)   HPI  Emma Thomas is a 27 y.o. female with a past medical history of diabetes, end-stage renal disease on daily peritoneal dialysis who presents today for evaluation of left great toe pain.  Patient reports that several months ago she pulled off an artificial nail and when she did so the entire nail came off.  She reports over the past week the toe has become more swollen, and she has developed pain in the ball of her foot.  No fevers or chills.  She went to see her podiatrist today who sent her to the emergency department for MRI and antibiotics and possibly surgery.  Patient Active Problem List   Diagnosis Date Noted   Acute osteomyelitis ([place]) 11/20/2022   Hypokalemia 11/20/2022   PD catheter dysfunction ([place])    Umbilical hernia without obstruction and without gangrene    ESRD on dialysis ([place]) 04/22/2022   Nausea and vomiting 04/19/2022   Labor and delivery indication for care or intervention 02/13/2020   Pyelonephritis affecting pregnancy in first trimester 11/01/2019   Supervision of high risk pregnancy, antepartum 11/01/2019   Depression 10/27/2019   Diabetes mellitus ([place]) 10/27/2019   History of chlamydia infection 10/27/2019   History of depression 10/27/2019   Vitamin D deficiency 10/27/2019   Liver function abnormality 08/15/2018   Microalbuminuric diabetic nephropathy ([place]) 08/15/2018   High anion gap metabolic acidosis    AKI (acute kidney injury) ([place])    Abdominal pain    Pyelonephritis 07/30/2016   DKA (diabetic ketoacidoses) 09/09/2015   History of gonorrhea 08/13/2015   Intrauterine device 05/03/2015   Neuropathy 04/13/2013   Weight loss, non-intentional 04/13/2013   Depression, major, recurrent, moderate ([place]) 03/06/2013   Hypertension 03/04/2013           Physical Exam   Triage Vital Signs: ED Triage Vitals  Enc Vitals Group     BP 11/20/22 1100 119/76     Pulse Rate 11/20/22 1100 (!) 103     Resp 11/20/22 1100 20     Temp 11/20/22 1100 99.3 F (37.4 C)     Temp src --      SpO2 11/20/22 1100 100 %     Weight 11/20/22 1101 154 lb (69.9 kg)     Height 11/20/22 1101 5\' 6"  (1.676 m)     Head Circumference --      Peak Flow --      Pain Score 11/20/22 1101 8     Pain Loc --      Pain Edu? --      Excl. in [place]? --     Most recent vital signs: Vitals:   11/20/22 1100  BP: 119/76  Pulse: (!) 103  Resp: 20  Temp: 99.3 F (37.4 C)  SpO2: 100%    Physical Exam Vitals and nursing note reviewed.  Constitutional:      General: Awake and alert. No acute distress.    Appearance: Normal appearance. The patient is normal weight.  HENT:     Head: Normocephalic and atraumatic.     Mouth: Mucous membranes are moist.  Eyes:     General: PERRL. Normal EOMs        Right eye: No discharge.        Left eye: No discharge.  Conjunctiva/sclera: Conjunctivae normal.  Cardiovascular:     Rate and Rhythm: Normal rate and regular rhythm.     Pulses: Normal pulses.  Pulmonary:     Effort: Pulmonary effort is normal. No respiratory distress.     Breath sounds: Normal breath sounds.  Abdominal:     Abdomen is soft. There is no abdominal tenderness. No rebound or guarding. No distention. Musculoskeletal:        General: No swelling. Normal range of motion.     Cervical back: Normal range of motion and neck supple.  Left great toe with left great toe with skin breakdown and swelling diffusely with absent great toenail.  There is erythema extending proximally to the plantar aspect of her ball of foot with tenderness to palpation.  No dorsal abnormalities.  Normal pedal pulses.  Sensation intact to light touch. Skin:    General: Skin is warm and dry.     Capillary Refill: Capillary refill takes less than 2 seconds.  Neurological:      Mental Status: The patient is awake and alert.      ED Results / Procedures / Treatments   Labs (all labs ordered are listed, but only abnormal results are displayed) Labs Reviewed  CBC WITH DIFFERENTIAL/PLATELET - Abnormal; Notable for the following components:      Result Value   WBC 14.7 (*)    RBC 3.23 (*)    Hemoglobin 8.6 (*)    HCT 27.6 (*)    Platelets 490 (*)    Neutro Abs 11.0 (*)    Abs Immature Granulocytes 0.10 (*)    All other components within normal limits  COMPREHENSIVE METABOLIC PANEL - Abnormal; Notable for the following components:   Sodium 132 (*)    Potassium 2.5 (*)    Chloride 96 (*)    Glucose, Bld 162 (*)    BUN 36 (*)    Creatinine, Ser 5.91 (*)    Calcium 7.4 (*)    Albumin 2.2 (*)    AST 74 (*)    ALT 63 (*)    GFR, Estimated 9 (*)    All other components within normal limits  CULTURE, BLOOD (ROUTINE X 2)  CULTURE, BLOOD (ROUTINE X 2)  LACTIC ACID, PLASMA  HEMOGLOBIN A1C     EKG     RADIOLOGY I independently reviewed and interpreted imaging and agree with radiologists findings.     PROCEDURES:  Critical Care performed:   .Critical Care  Performed by: Marquette Old, PA-C Authorized by: Marquette Old, PA-C   Critical care provider statement:    Critical care time (minutes):  30   Critical care time was exclusive of:  Separately billable procedures and treating other patients   Critical care was necessary to treat or prevent imminent or life-threatening deterioration of the following conditions:  Metabolic crisis and sepsis   Critical care was time spent personally by me on the following activities:  Development of treatment plan with patient or surrogate, discussions with consultants, evaluation of patient's response to treatment, examination of patient, ordering and review of laboratory studies, ordering and review of radiographic studies, ordering and performing treatments and interventions, pulse oximetry, re-evaluation of  patient's condition and review of old charts   I assumed direction of critical care for this patient from another provider in my specialty: no     Care discussed with: admitting provider      MEDICATIONS ORDERED IN ED: Medications  potassium chloride 10 mEq in 100 mL  IVPB (has no administration in time range)  cefTRIAXone (ROCEPHIN) 2 g in sodium chloride 0.9 % 100 mL IVPB (has no administration in time range)  metroNIDAZOLE (FLAGYL) IVPB 500 mg (has no administration in time range)  vancomycin (VANCOREADY) IVPB 1250 mg/250 mL (has no administration in time range)  heparin injection 5,000 Units (has no administration in time range)  acetaminophen (TYLENOL) tablet 650 mg (has no administration in time range)    Or  acetaminophen (TYLENOL) suppository 650 mg (has no administration in time range)  polyethylene glycol (MIRALAX / GLYCOLAX) packet 17 g (has no administration in time range)  multivitamin with minerals tablet 1 tablet (has no administration in time range)  ondansetron (ZOFRAN) tablet 4 mg (has no administration in time range)    Or  ondansetron (ZOFRAN) injection 4 mg (has no administration in time range)  insulin glargine-yfgn (SEMGLEE) injection 14 Units (has no administration in time range)  insulin aspart (novoLOG) injection 0-9 Units (has no administration in time range)  insulin aspart (novoLOG) injection 0-5 Units (has no administration in time range)  insulin aspart (novoLOG) injection 3 Units (has no administration in time range)  potassium chloride (KLOR-CON) packet 40 mEq (has no administration in time range)     IMPRESSION / MDM / ASSESSMENT AND PLAN / ED COURSE  I reviewed the triage vital signs and the nursing notes.   Differential diagnosis includes, but is not limited to, cellulitis, osteomyelitis, abscess, diabetic ketoacidosis, sepsis/SIRS, septic joint.  Patient is awake and alert, mildly tachycardic on arrival though normotensive and afebrile.  She was  placed on the cardiac monitor.  Labs obtained in triage are revealing for a leukocytosis to 14 as well as hypokalemia to 2.5.  Her potassium was repleted with IV and oral potassium.  X-ray reveals osseous destruction concerning for acute osteomyelitis.  I discussed with Dr. Cleda Mccreedy who agrees with MRI, broad-spectrum antibiotics, and admission to the hospital.  He recommends medical optimization prior to likely amputation of the great toe.  Patient is in agreement with this plan.  I discussed with pharmacy renal dosing based on her peritoneal dialysis.  Patient was asymptomatic hospitalist team.   Patient's presentation is most consistent with acute presentation with potential threat to life or bodily function.    FINAL CLINICAL IMPRESSION(S) / ED DIAGNOSES   Final diagnoses:  Osteomyelitis of left foot, unspecified type ([place])  Hypokalemia     Rx / DC Orders   ED Discharge Orders     None        Note:  This document was prepared using Dragon voice recognition software and may include unintentional dictation errors.   Emeline Gins 11/20/22 1605    Carrie Mew, MD 11/20/22 [phone]

## 2022-11-20 NOTE — Progress Notes (Signed)
PHARMACY -  BRIEF ANTIBIOTIC NOTE   Pharmacy has received consult(s) for vancomycin from an ED provider.  The patient's profile has been reviewed for ht/wt/allergies/indication/available labs.    One time order(s) placed for Vancomycin 1250 mg IV x 1  Further antibiotics/pharmacy consults should be ordered by admitting physician if indicated.                       Thank you, Alison Murray 11/20/2022  1:40 PM

## 2022-11-20 NOTE — Inpatient Diabetes Management (Signed)
  Inpatient Diabetes Program Recommendations  AACE/ADA: New Consensus Statement on Inpatient Glycemic Control   Target Ranges:  Prepandial:   less than 140 mg/dL      Peak postprandial:   less than 180 mg/dL (1-2 hours)      Critically ill patients:  140 - 180 mg/dL    Latest Reference Range & Units 11/20/22 11:07  Glucose 70 - 99 mg/dL 162 (H)    Latest Reference Range & Units 04/19/22 17:05  Hemoglobin A1C 4.8 - 5.6 % 9.4 (H)   Review of Glycemic Control  Diabetes history: DM1 (does NOT make insulin; requires basal, correction, and carbohydrate coverage insulin)  Outpatient Diabetes medications: Tandem T-Slim insulin pump with Humalog; Dexcom G6 CGM; when not on pump Lantus 14 units daily, Humalog TID for meal coverage and correction (1 unit per 10 grams of carbs, 1 unit for every 50 mg/dl above 150 mg/dl)  Current orders for Inpatient glycemic control: Semglee 30 units QHS, Novolog 0-9 units TID with meals  Inpatient Diabetes Program Recommendations:    Insulin: Please change Semglee to 14 units Q24H to start now, add Novolog 0-5 units QHS for bedtime correction, and Novolog 3 units TID with meals for meal coverage if patient eats at least 50% of meals.  NOTE: Spoke with patient over the phone regarding diabetes and home regimen for diabetes management.  Patient states that she is followed by [facility] for diabetes management and last seen 08/04/22.  Patient uses Tandem T-Slim insulin pump with Humalog insulin along with Dexcom G6 CGM as an outpatient. Patient reports that she removed her insulin pump and Dexcom today prior to MRI (before noon today). Patient reports that she is not sure of pump settings and not documented in telemedicine note on 08/04/22. Patient confirms that when not on her insulin pump, she is prescribed Lantus 14 units daily, Humalog 1 units per 10 grams of carbs, and Novolog 1 unit per 50 mg/dl above target of 150 mg/dl.  Patient states she has been using  insulin pump and has not used SQ insulin injections in a while. Patient has experienced severe hypoglycemia in the past and is very concerned about glucose control. Discussed recommendation for Semglee 14 units Q24H, Novolog correction and Novolog meal coverage if ordered diet. Patient agreeable to recommendations. Informed patient that inpatient diabetes team will follow patient while inpatient and make recommendations if needed. Patient verbalized understanding and has no questions at this time. Sent chat message to Dr. Charleen Kirks regarding recommendations.  Thanks, Barnie Alderman, RN, MSN, [place] [facility] Coordinator Inpatient Diabetes Program [phone] (Team Pager from 8am to [place])

## 2022-11-20 NOTE — Consult Note (Signed)
PODIATRY / FOOT AND ANKLE SURGERY CONSULTATION NOTE  Requesting Physician: Dr. Charleen Kirks  Reason for consult: left foot infection  Chief Complaint: left foot infection   HPI: Emma Thomas is a 27 y.o. female who presents with pain and \\discomfort  with redness and swelling to the left big toe and 1st MTPJ.  Patient was seen in clinic by Dr. Cleda Mccreedy today and was noted to have signs of osteomyelitis on x-ray to the big toe.  Dr. Cleda Mccreedy states that she has a hx of skin picking and has picked her big toe nail off and dead skin in the area.  She has a hx of DM1 and has been uncontrolled in the past although her recent A1c was 7.2%.  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  Podiatry team was consulted for further evalutation.  PMHx:  Past Medical History:  Diagnosis Date   Anemia    Diabetes mellitus without complication ([place])    Type 1 DM   Essential hypertension    Headache    Hypertension 03/04/2013   Neurologic disorder    Both feet   Recurrent UTI    Renal disorder     Surgical Hx:  Past Surgical History:  Procedure Laterality Date   abscess removal     excision of bartholin cyst   CAPD INSERTION N/A 06/09/2022   Procedure: LAPAROSCOPIC INSERTION CONTINUOUS AMBULATORY PERITONEAL DIALYSIS  (CAPD) CATHETER, PD rep to be present;  Surgeon: Olean Ree, MD;  Location: [facility] ORS;  Service: General;  Laterality: N/A;   CAPD REVISION N/A 07/16/2022   Procedure: LAPAROSCOPIC REVISION CONTINUOUS AMBULATORY PERITONEAL DIALYSIS  (CAPD) CATHETER;  Surgeon: Olean Ree, MD;  Location: [facility] ORS;  Service: General;  Laterality: N/A;   DIALYSIS/PERMA CATHETER INSERTION N/A 04/24/2022   Procedure: DIALYSIS/PERMA CATHETER INSERTION;  Surgeon: Katha Cabal, MD;  Location: [place] [facility];  Service: Cardiovascular;  Laterality: N/A;   DIALYSIS/PERMA CATHETER REMOVAL N/A 06/02/2022   Procedure: DIALYSIS/PERMA CATHETER REMOVAL;  Surgeon: Katha Cabal, MD;   Location: [facility];  Service: Cardiovascular;  Laterality: N/A;   INSERTION OF DIALYSIS CATHETER Right    ARM   Nexplanon  [id number]   UMBILICAL HERNIA REPAIR N/A 06/09/2022   Procedure: HERNIA REPAIR UMBILICAL ADULT;  Surgeon: Olean Ree, MD;  Location: [facility] OR[facility];  Service: General;  Laterality: N/A;    FHx:  Family History  Problem Relation Age of Onset   Breast cancer Mother 53   Lung cancer Maternal Grandmother    Diabetes type II Paternal Grandmother     Social History:  reports that she has never smoked. She has been exposed to tobacco smoke. She has never used smokeless tobacco. She reports that she does not currently use alcohol after a past usage of about 2.0 standard drinks of alcohol per week. She reports that she does not use drugs.  Allergies: No Known Allergies   (Not in a hospital admission)   Physical Exam: General: Alert and oriented.  No apparent distress.  Vascular: DP/PT pulses palpable bil, CFT intact to digits bil, no hair growth to digits bil.  Neuro: Light touch sensation reduced to BLE  Derm: No obvious ulcerations present to the left hallux or 1st MTPJ.  Appears to have multiple excoriations present to the area, no hallux nail.  Associated erythema and edema present to the 1st toe and MTPJ.  No obvious palpable fluctuance. Skin appears to be fairly atrophic to both feet.  MSK: POP of the left 1st MTPJ and big toe.   Results for orders placed or performed during the hospital encounter of 11/20/22 (from the past 48 hour(s))  CBC with Differential     Status: Abnormal   Collection Time: 11/20/22 11:07 AM  Result Value Ref Range   WBC 14.7 (H) 4.0 - 10.5 K/uL   RBC 3.23 (L) 3.87 - 5.11 MIL/uL   Hemoglobin 8.6 (L) 12.0 - 15.0 g/dL   HCT 27.6 (L) 36.0 - 46.0 %   MCV 85.4 80.0 - 100.0 fL   MCH 26.6 26.0 - 34.0 pg   MCHC 31.2 30.0 - 36.0 g/dL   RDW 11.9 11.5 - 15.5 %   Platelets 490 (H) 150 - 400 K/uL   nRBC 0.0 0.0 - 0.2 %    Neutrophils Relative % 74 %   Neutro Abs 11.0 (H) 1.7 - 7.7 K/uL   Lymphocytes Relative 16 %   Lymphs Abs 2.3 0.7 - 4.0 K/uL   Monocytes Relative 6 %   Monocytes Absolute 0.9 0.1 - 1.0 K/uL   Eosinophils Relative 2 %   Eosinophils Absolute 0.3 0.0 - 0.5 K/uL   Basophils Relative 1 %   Basophils Absolute 0.1 0.0 - 0.1 K/uL   Immature Granulocytes 1 %   Abs Immature Granulocytes 0.10 (H) 0.00 - 0.07 K/uL    Comment: Performed at [facility] [place], [place] [address]  Comprehensive metabolic panel     Status: Abnormal   Collection Time: 11/20/22 11:07 AM  Result Value Ref Range   Sodium 132 (L) 135 - 145 mmol/L   Potassium 2.5 (LL) 3.5 - 5.1 mmol/L    Comment: CRITICAL RESULT CALLED TO, READ BACK BY AND VERIFIED WITH SAM HAMILTON AT 1200 11/20/22 DAS    Chloride 96 (L) 98 - 111 mmol/L   CO2 24 22 - 32 mmol/L   Glucose, Bld 162 (H) 70 - 99 mg/dL    Comment: Glucose reference range applies only to samples taken after fasting for at least 8 hours.   BUN 36 (H) 6 - 20 mg/dL   Creatinine, Ser 5.91 (H) 0.44 - 1.00 mg/dL   Calcium 7.4 (L) 8.9 - 10.3 mg/dL   Total Protein 7.8 6.5 - 8.1 g/dL   Albumin 2.2 (L) 3.5 - 5.0 g/dL   AST 74 (H) 15 - 41 U/L   ALT 63 (H) 0 - 44 U/L   Alkaline Phosphatase 79 38 - 126 U/L   Total Bilirubin 0.9 0.3 - 1.2 mg/dL   GFR, Estimated 9 (L) >60 mL/min    Comment: (NOTE) Calculated using the CKD-EPI Creatinine Equation (2021)    Anion gap 12 5 - 15    Comment: Performed at [facility], [address] [place],  [address]   MRI Left foot without contrast  Result Date: 11/20/2022 CLINICAL DATA:  Great toe pain and swelling, clinical concern for osteomyelitis based on radiography. EXAM: MRI OF THE LEFT FOOT WITHOUT CONTRAST TECHNIQUE: Multiplanar, multisequence MR imaging of the left forefoot was performed. No intravenous contrast was administered. COMPARISON:  Radiographs 11/20/2022 FINDINGS: Bones/Joint/Cartilage  Severe marrow edema and cortical indistinctness in the distal phalanx great toe compatible with active osteomyelitis. Endosteal edema in the proximal phalanx of the great toe most notable along the plantar and proximal margins for example on image 8 series 11, reactive findings versus or early osteomyelitis. There is abnormal edema in both sesamoid bones compatible with sesamoiditis, infection not readily excluded.  Degenerative arthropathy at the articulation of the navicular and medial cuneiform as on image 14 series 8. There is a dorsal effusion of the first MTP joint and to a lesser extent of the interphalangeal joint of the great toe. Ligaments Lisfranc ligament intact Muscles and Tendons Diffuse muscular edema in the region is likely neurogenic. Soft tissues Abnormal subcutaneous inflammatory process along the great toe plantar to the phalanges and distal metatarsal, primarily infiltrative although incipient abscess formation is difficult to exclude. Dorsal subcutaneous edema in the forefoot tracking into the base of the toes, cellulitis not excluded. Tiny focus of metal artifact plantar to the fifth MTP joint on image 22 series 5, no radiographic abnormality visible, possibly a microscopic metallic particle. IMPRESSION: 1. Active osteomyelitis of the distal phalanx great toe. 2. Endosteal edema in the proximal phalanx of the great toe most notable along the plantar and proximal margins, reactive findings versus early osteomyelitis. 3. Abnormal edema in both sesamoid bones compatible with sesamoiditis, infection not readily excluded. 4. Dorsal effusion of the first MTP joint and to a lesser extent of the interphalangeal joint of the great toe, septic joint not excluded. 5. Abnormal subcutaneous inflammatory process along the plantar aspect of the great toe, primarily infiltrative at this time without a definite drainable abscess. 6. Dorsal subcutaneous edema in the forefoot tracking into the base of the toes,  cellulitis not excluded. 7. Diffuse muscular edema in the forefoot is likely neurogenic. 8. Degenerative arthropathy at the articulation of the navicular and medial cuneiform. 9. Tiny focus of metal artifact plantar to the fifth MTP joint, no radiographic abnormality visible, possibly a microscopic metallic particle. Electronically Signed   By: Van Clines M.D.   On: 11/20/2022 14:25   DG Foot Complete Left  Result Date: 11/20/2022 CLINICAL DATA:  Left foot and great toe pain and swelling. Patient reports the nail of her great toe falling off months ago. EXAM: LEFT FOOT - COMPLETE 3+ VIEW COMPARISON:  None Available. FINDINGS: The bones are demineralized. There is cortical destruction of the distal 1st phalanx which is highly suspicious for osteomyelitis. The proximal phalanx appears intact. The additional digits and metatarsals appear intact. There are moderately advanced degenerative changes at the articulation between the medial cuneiform and the navicular. There is medial forefoot soft tissue swelling without evidence of foreign body or soft tissue emphysema. IMPRESSION: 1. Cortical destruction of the distal 1st phalanx is highly suspicious for osteomyelitis. 2. Medial forefoot soft tissue swelling without evidence of foreign body or soft tissue emphysema. Electronically Signed   By: Richardean Sale M.D.   On: 11/20/2022 13:10    Blood pressure 119/76, pulse (!) 103, temperature 99.3 F (37.4 C), resp. rate 20, height 5\' 6"  (1.676 m), weight 69.9 kg, last menstrual period 11/11/2022, SpO2 100 %.   Assessment Osteomyelitis L 1st ray DM1, hx uncontrolled ESRD on dialysis  Plan -Patient seen and examined. -X-ray and MRI imaging reviewed and discussed with patient in detail. MRI shows osteo of the 1st ray with possible septic 1st MTPJ -All treatment options were discussed with the patient of both conservative and surgical attempts at correction including potential risks and complications.   Patient has elected for procedure consisting of partial L 1st ray amputation.  No guarantees given.  Consent obtained.  Patient will have to be medical stabilized prior to procedure.  Likely will plan for surgery on Sunday or Monday depending on medical clearance.  Will have to be NPO at least 8 hours prior to surgery.  Will call to schedule OR time when medically stable. -Will plan for surgery after that time. -Appreciate medicine recs for Abx therapy.  Caroline More, DPM 11/20/2022, 3:08 PM

## 2022-11-20 NOTE — Assessment & Plan Note (Signed)
Patient presenting with several week history of ulceration of the left great toe with 1 week of sudden swelling and pain on the plantar aspect of the metatarsal.  CT imaging with osteomyelitis.  - Podiatry consulted; appreciate their recommendations - Plan for surgery on 1/8 for now - Continue broad-spectrum antibiotics (vancomycin, Rocephin and Flagyl) given rapid spread of erythema and edema - Tylenol as needed for fever

## 2022-11-20 NOTE — Assessment & Plan Note (Addendum)
Replace potassium twice today.  Holding water pills.

## 2022-11-20 NOTE — H&P (Signed)
History and Physical    Patient: Emma Thomas ZMO:[id number] DOB: 04/08/1996 DOA: 11/20/2022 DOS: the patient was seen and examined on 11/20/2022 PCP: Margarita Rana, MD  Patient coming from: Home  Chief Complaint:  Chief Complaint  Patient presents with   Toe Injury    Left big toe   HPI: Emma Thomas is a 27 y.o. female with medical history significant of type 1 diabetes, ESRD on PD, hypertension, who presents to the ED due to ulceration of the left toe.  Mrs. Rampersad states that her toenail fell off several weeks ago, and since then, she has been picking at her toe.  Approximately 1 week ago, her left toe became significantly swollen and she developed pain on the plantar aspect of the joint.  Due to this, she followed up with her podiatrist today who recommended she go to the ED.  She denies any fever, chills, nausea, vomiting.  She denies any prior history of similar infections.  Mrs. Chopra states that she had missed approximately 1 week of dialysis when she had a problem with her PD catheter.  It is now working again and she did resume dialysis last night.  ED course: On arrival to the ED, patient had a low-grade temperature at 99.3 with blood pressure 119/76 and heart rate of 103.  She was saturating at 100% on room air.  Initial workup remarkable for potassium of 2.5, AST of 74, and ALT of 63.  BUN elevated at 36 and creatinine elevated at 5.91 consistent with history of ESRD, CBC with WBC of 14.7, hemoglobin 8.6 and platelets of 190.  X-ray of the left foot was completed that demonstrated cortical destruction of the first phalanx distally.  MRI was obtained and results still pending.  TRH consulted for admission.  Review of Systems: As mentioned in the history of present illness. All other systems reviewed and are negative.  Past Medical History:  Diagnosis Date   Anemia    Diabetes mellitus without complication ([place])    Type 1 DM   Essential hypertension    Headache     Hypertension 03/04/2013   Neurologic disorder    Both feet   Recurrent UTI    Renal disorder    Past Surgical History:  Procedure Laterality Date   abscess removal     excision of bartholin cyst   CAPD INSERTION N/A 06/09/2022   Procedure: LAPAROSCOPIC INSERTION CONTINUOUS AMBULATORY PERITONEAL DIALYSIS  (CAPD) CATHETER, PD rep to be present;  Surgeon: Olean Ree, MD;  Location: [facility];  Service: General;  Laterality: N/A;   CAPD REVISION N/A 07/16/2022   Procedure: LAPAROSCOPIC REVISION CONTINUOUS AMBULATORY PERITONEAL DIALYSIS  (CAPD) CATHETER;  Surgeon: Olean Ree, MD;  Location: [facility];  Service: General;  Laterality: N/A;   DIALYSIS/PERMA CATHETER INSERTION N/A 04/24/2022   Procedure: DIALYSIS/PERMA CATHETER INSERTION;  Surgeon: Katha Cabal, MD;  Location: [facility];  Service: Cardiovascular;  Laterality: N/A;   DIALYSIS/PERMA CATHETER REMOVAL N/A 06/02/2022   Procedure: DIALYSIS/PERMA CATHETER REMOVAL;  Surgeon: Katha Cabal, MD;  Location: [facility];  Service: Cardiovascular;  Laterality: N/A;   INSERTION OF DIALYSIS CATHETER Right    ARM   Nexplanon  [id number]   UMBILICAL HERNIA REPAIR N/A 06/09/2022   Procedure: HERNIA REPAIR UMBILICAL ADULT;  Surgeon: Olean Ree, MD;  Location: [facility];  Service: General;  Laterality: N/A;   Social History:  reports that she has never smoked. She has been exposed to tobacco smoke.  She has never used smokeless tobacco. She reports that she does not currently use alcohol after a past usage of about 2.0 standard drinks of alcohol per week. She reports that she does not use drugs.  No Known Allergies  Family History  Problem Relation Age of Onset   Breast cancer Mother 2   Lung cancer Maternal Grandmother    Diabetes type II Paternal Grandmother     Prior to Admission medications   Medication Sig Start Date End Date Taking? Authorizing Provider  furosemide (LASIX) 80 MG tablet Take 80 mg by mouth  daily. 08/10/22  Yes [provider]  multivitamin (RENA-VIT) TABS tablet Take 1 tablet by mouth daily. 10/30/22  Yes [provider]  acetaminophen (TYLENOL) 500 MG tablet Take 2 tablets (1,000 mg total) by mouth every 6 (six) hours as needed for mild pain. 07/16/22   Olean Ree, MD  amLODipine (NORVASC) 10 MG tablet Take 1 tablet (10 mg total) by mouth daily. 04/28/22   Jennye Boroughs, MD  atorvastatin (LIPITOR) 40 MG tablet Take 40 mg by mouth daily. 01/30/22   [provider]  bumetanide (BUMEX) 1 MG tablet Take 1 mg by mouth 2 (two) times daily. 07/13/22   [provider]  ferrous gluconate (FERGON) 324 MG tablet Take 324 mg by mouth daily with breakfast. 05/03/20   [provider]  insulin lispro (HUMALOG) 100 UNIT/ML injection Inject 5-10 Units into the skin 3 (three) times daily before meals. Plus sliding scale 1 unit per 50 > 150    [provider]  JUNEL 1/20 1-20 MG-MCG tablet Take 1 tablet by mouth daily. 04/17/22   [provider]  losartan (COZAAR) 100 MG tablet Take 100 mg by mouth daily. 06/04/22   [provider]  montelukast (SINGULAIR) 10 MG tablet Take 10 mg by mouth daily. 04/19/22   [provider]  oxyCODONE (OXY IR/ROXICODONE) 5 MG immediate release tablet Take 1 tablet (5 mg total) by mouth every 4 (four) hours as needed for severe pain. Patient not taking: Reported on 08/10/2022 07/16/22   Olean Ree, MD  predniSONE (DELTASONE) 20 MG tablet Take 1 tablet (20 mg total) by mouth daily with breakfast. Patient not taking: Reported on 11/20/2022 08/10/22   Nena Polio, MD  SUMAtriptan (IMITREX) 50 MG tablet Take 50 mg by mouth daily as needed for migraine. 11/11/21   [provider]  Vitamin D, Ergocalciferol, (DRISDOL) 1.25 MG (50000 UNIT) CAPS capsule Take 50,000 Units by mouth once a week. 02/05/22   [provider]    Physical Exam: Vitals:   11/20/22 1100 11/20/22 1101  BP:  119/76   Pulse: (!) 103   Resp: 20   Temp: 99.3 F (37.4 C)   SpO2: 100%   Weight:  69.9 kg  Height:  5\' 6"  (1.676 m)   Physical Exam Vitals and nursing note reviewed.  Constitutional:      General: She is not in acute distress.    Appearance: She is normal weight. She is not toxic-appearing.  HENT:     Head: Normocephalic and atraumatic.     Mouth/Throat:     Mouth: Mucous membranes are moist.     Pharynx: Oropharynx is clear.  Eyes:     Conjunctiva/sclera: Conjunctivae normal.     Pupils: Pupils are equal, round, and reactive to light.  Cardiovascular:     Rate and Rhythm: Normal rate and regular rhythm.     Heart sounds: No murmur heard.  No gallop.  Pulmonary:     Effort: Pulmonary effort is normal. No respiratory distress.     Breath sounds: Normal breath sounds. No wheezing, rhonchi or rales.  Abdominal:     General: Bowel sounds are normal.     Palpations: Abdomen is soft.     Tenderness: There is no abdominal tenderness.  Musculoskeletal:     Cervical back: Neck supple.     Right lower leg: 1+ Pitting Edema present.     Left lower leg: 1+ Pitting Edema present.       Feet:  Skin:    General: Skin is warm and dry.  Neurological:     General: No focal deficit present.     Mental Status: She is alert and oriented to person, place, and time. Mental status is at baseline.  Psychiatric:        Mood and Affect: Mood normal.        Behavior: Behavior normal.        Thought Content: Thought content normal.        Judgment: Judgment normal.        Data Reviewed: CBC with WBC of 14.7, hemoglobin of 8.6, platelets of 490 CMP with sodium of 132, potassium of 2.5, chloride of 96, bicarb of 24, glucose of 162, BUN of 36, creatinine 5.9, calcium 7.4, albumin 2.2, AST 74, ALT 63, GFR 9.  MRI Left foot without contrast  Result Date: 11/20/2022 CLINICAL DATA:  Great toe pain and swelling, clinical concern for osteomyelitis based on radiography. EXAM: MRI OF THE LEFT  FOOT WITHOUT CONTRAST TECHNIQUE: Multiplanar, multisequence MR imaging of the left forefoot was performed. No intravenous contrast was administered. COMPARISON:  Radiographs 11/20/2022 FINDINGS: Bones/Joint/Cartilage Severe marrow edema and cortical indistinctness in the distal phalanx great toe compatible with active osteomyelitis. Endosteal edema in the proximal phalanx of the great toe most notable along the plantar and proximal margins for example on image 8 series 11, reactive findings versus or early osteomyelitis. There is abnormal edema in both sesamoid bones compatible with sesamoiditis, infection not readily excluded. Degenerative arthropathy at the articulation of the navicular and medial cuneiform as on image 14 series 8. There is a dorsal effusion of the first MTP joint and to a lesser extent of the interphalangeal joint of the great toe. Ligaments Lisfranc ligament intact Muscles and Tendons Diffuse muscular edema in the region is likely neurogenic. Soft tissues Abnormal subcutaneous inflammatory process along the great toe plantar to the phalanges and distal metatarsal, primarily infiltrative although incipient abscess formation is difficult to exclude. Dorsal subcutaneous edema in the forefoot tracking into the base of the toes, cellulitis not excluded. Tiny focus of metal artifact plantar to the fifth MTP joint on image 22 series 5, no radiographic abnormality visible, possibly a microscopic metallic particle. IMPRESSION: 1. Active osteomyelitis of the distal phalanx great toe. 2. Endosteal edema in the proximal phalanx of the great toe most notable along the plantar and proximal margins, reactive findings versus early osteomyelitis. 3. Abnormal edema in both sesamoid bones compatible with sesamoiditis, infection not readily excluded. 4. Dorsal effusion of the first MTP joint and to a lesser extent of the interphalangeal joint of the great toe, septic joint not excluded. 5. Abnormal subcutaneous  inflammatory process along the plantar aspect of the great toe, primarily infiltrative at this time without a definite drainable abscess. 6. Dorsal subcutaneous edema in the forefoot tracking into the base of the toes, cellulitis not excluded. 7. Diffuse muscular edema in  the forefoot is likely neurogenic. 8. Degenerative arthropathy at the articulation of the navicular and medial cuneiform. 9. Tiny focus of metal artifact plantar to the fifth MTP joint, no radiographic abnormality visible, possibly a microscopic metallic particle. Electronically Signed   By: Van Clines M.D.   On: 11/20/2022 14:25   DG Foot Complete Left  Result Date: 11/20/2022 CLINICAL DATA:  Left foot and great toe pain and swelling. Patient reports the nail of her great toe falling off months ago. EXAM: LEFT FOOT - COMPLETE 3+ VIEW COMPARISON:  None Available. FINDINGS: The bones are demineralized. There is cortical destruction of the distal 1st phalanx which is highly suspicious for osteomyelitis. The proximal phalanx appears intact. The additional digits and metatarsals appear intact. There are moderately advanced degenerative changes at the articulation between the medial cuneiform and the navicular. There is medial forefoot soft tissue swelling without evidence of foreign body or soft tissue emphysema. IMPRESSION: 1. Cortical destruction of the distal 1st phalanx is highly suspicious for osteomyelitis. 2. Medial forefoot soft tissue swelling without evidence of foreign body or soft tissue emphysema. Electronically Signed   By: Richardean Sale M.D.   On: 11/20/2022 13:10    Results are pending, will review when available.  Assessment and Plan:  * Acute osteomyelitis [facility]) Patient presenting with several week history of ulceration of the left great toe with 1 week of sudden swelling and pain on the plantar aspect of the metatarsal.  CT imaging with osteomyelitis.  - Podiatry consulted; appreciate their recommendations -  Plan for surgery on 1/8 for now - Continue broad-spectrum antibiotics (vancomycin, Rocephin and Flagyl) given rapid spread of erythema and edema - Tylenol as needed for fever  Hypokalemia Likely in the setting of missed dialysis as patient missed approximately 1 week of PD.  She resumed yesterday once her catheter was working again.  Given ESRD status, will replace cautiously.  - EKG still pending - 40 mEq potassium oral and 10 mill equivalents IV - Repeat BMP this evening  Diabetes mellitus ([place]) Patient wears a insulin pump with Dexcom at home, but both needed to be turned off due to MRI.  - Diabetic coordinator consulted; appreciate their recommendations - Semglee 14 units at bedtime - NovoLog 3 units 3 times daily coverage if patient eats at least 50% of her meal - SSI, sensitive  ESRD on dialysis [facility]) - Nephrology consulted for management of PD  Advance Care Planning:   Code Status: Full Code   Consults: Podiatry  Family Communication: No family at bedside.  Patient stated she will update her husband  Severity of Illness: The appropriate patient status for this patient is OBSERVATION. Observation status is judged to be reasonable and necessary in order to provide the required intensity of service to ensure the patient's safety. The patient's presenting symptoms, physical exam findings, and initial radiographic and laboratory data in the context of their medical condition is felt to place them at decreased risk for further clinical deterioration. Furthermore, it is anticipated that the patient will be medically stable for discharge from the hospital within 2 midnights of admission.   Author: Jose Persia, MD 11/20/2022 3:59 PM  For on call review [email or web address].

## 2022-11-20 NOTE — Assessment & Plan Note (Addendum)
Nephrology to start peritoneal dialysis tomorrow.

## 2022-11-20 NOTE — Assessment & Plan Note (Signed)
Usually wears insulin pump at home.  Since patient will be n.p.o. after midnight will cut back on Semglee insulin to 8 units tonight and use short acting insulin prior to meals.

## 2022-11-21 DIAGNOSIS — E1022 Type 1 diabetes mellitus with diabetic chronic kidney disease: Secondary | ICD-10-CM | POA: Diagnosis not present

## 2022-11-21 DIAGNOSIS — D638 Anemia in other chronic diseases classified elsewhere: Secondary | ICD-10-CM | POA: Insufficient documentation

## 2022-11-21 DIAGNOSIS — E876 Hypokalemia: Secondary | ICD-10-CM | POA: Diagnosis not present

## 2022-11-21 DIAGNOSIS — N186 End stage renal disease: Secondary | ICD-10-CM | POA: Diagnosis not present

## 2022-11-21 DIAGNOSIS — M8610 Other acute osteomyelitis, unspecified site: Secondary | ICD-10-CM | POA: Diagnosis not present

## 2022-11-21 DIAGNOSIS — R601 Generalized edema: Secondary | ICD-10-CM

## 2022-11-21 LAB — COMPREHENSIVE METABOLIC PANEL
ALT: 50 U/L — ABNORMAL HIGH (ref 0–44)
AST: 53 U/L — ABNORMAL HIGH (ref 15–41)
Albumin: 1.8 g/dL — ABNORMAL LOW (ref 3.5–5.0)
Alkaline Phosphatase: 67 U/L (ref 38–126)
Anion gap: 12 (ref 5–15)
BUN: 41 mg/dL — ABNORMAL HIGH (ref 6–20)
CO2: 21 mmol/L — ABNORMAL LOW (ref 22–32)
Calcium: 7.2 mg/dL — ABNORMAL LOW (ref 8.9–10.3)
Chloride: 99 mmol/L (ref 98–111)
Creatinine, Ser: 6.45 mg/dL — ABNORMAL HIGH (ref 0.44–1.00)
GFR, Estimated: 8 mL/min — ABNORMAL LOW (ref >60)
Glucose, Bld: 140 mg/dL — ABNORMAL HIGH (ref 70–99)
Potassium: 3 mmol/L — ABNORMAL LOW (ref 3.5–5.1)
Sodium: 132 mmol/L — ABNORMAL LOW (ref 135–145)
Total Bilirubin: 0.8 mg/dL (ref 0.3–1.2)
Total Protein: 6.4 g/dL — ABNORMAL LOW (ref 6.5–8.1)

## 2022-11-21 LAB — CBC WITH DIFFERENTIAL/PLATELET
Abs Immature Granulocytes: 0.09 10*3/uL — ABNORMAL HIGH (ref 0.00–0.07)
Basophils Absolute: 0.1 10*3/uL (ref 0.0–0.1)
Basophils Relative: 1 %
Eosinophils Absolute: 0.3 10*3/uL (ref 0.0–0.5)
Eosinophils Relative: 2 %
HCT: 22.7 % — ABNORMAL LOW (ref 36.0–46.0)
Hemoglobin: 7.4 g/dL — ABNORMAL LOW (ref 12.0–15.0)
Immature Granulocytes: 1 %
Lymphocytes Relative: 12 %
Lymphs Abs: 1.7 10*3/uL (ref 0.7–4.0)
MCH: 27.6 pg (ref 26.0–34.0)
MCHC: 32.6 g/dL (ref 30.0–36.0)
MCV: 84.7 fL (ref 80.0–100.0)
Monocytes Absolute: 0.7 10*3/uL (ref 0.1–1.0)
Monocytes Relative: 5 %
Neutro Abs: 11.2 10*3/uL — ABNORMAL HIGH (ref 1.7–7.7)
Neutrophils Relative %: 79 %
Platelets: 411 10*3/uL — ABNORMAL HIGH (ref 150–400)
RBC: 2.68 MIL/uL — ABNORMAL LOW (ref 3.87–5.11)
RDW: 11.8 % (ref 11.5–15.5)
WBC: 14 10*3/uL — ABNORMAL HIGH (ref 4.0–10.5)
nRBC: 0 % (ref 0.0–0.2)

## 2022-11-21 LAB — GLUCOSE, CAPILLARY
Glucose-Capillary: 139 mg/dL — ABNORMAL HIGH (ref 70–99)
Glucose-Capillary: 117 mg/dL — ABNORMAL HIGH (ref 70–99)
Glucose-Capillary: 119 mg/dL — ABNORMAL HIGH (ref 70–99)
Glucose-Capillary: 145 mg/dL — ABNORMAL HIGH (ref 70–99)

## 2022-11-21 MED ORDER — INSULIN GLARGINE-YFGN 100 UNIT/ML ~~LOC~~ SOLN
8.0000 [IU] | SUBCUTANEOUS | Status: DC
  Administered 2022-11-21 – 2022-11-22 (×2): 8 [IU] via SUBCUTANEOUS
  Filled 2022-11-21 (×2): qty 0.08

## 2022-11-21 MED ORDER — TRAMADOL HCL 50 MG PO TABS
50.0000 mg | ORAL_TABLET | Freq: Four times a day (QID) | ORAL | Status: AC | PRN
  Administered 2022-11-21 (×2): 50 mg via ORAL
  Filled 2022-11-21 (×2): qty 1

## 2022-11-21 NOTE — Progress Notes (Signed)
       CROSS COVER NOTE  NAME: ZAMIAH TOLLETT MRN: [id number] DOB : 21-Apr-1996   HPI/Events of Note   Ongoing severe abdominal and Lft flank ongoin   Assessment and  Interventions   Assessment:  Plan: Torodol 30 mg x1 X X      August Saucer NP [facility]

## 2022-11-21 NOTE — Plan of Care (Signed)

## 2022-11-21 NOTE — Progress Notes (Signed)
Referring Provider: No ref. provider found Primary Care Physician:  Margarita Rana, MD Primary Nephrologist:  Dr.   Luiz Iron for Consultation: End-stage renal disease  HPI: 27 year old female with history of type 1 diabetes, end-stage renal disease on peritoneal dialysis, hypertension now admitted with history of left toe ulceration.  She had an MRI showing osteomyelitis.  She was found to have hypokalemia and had multiple potassium supplements.  Past Medical History:  Diagnosis Date   Anemia    Diabetes mellitus without complication ([place])    Type 1 DM   Essential hypertension    Headache    Hypertension 03/04/2013   Neurologic disorder    Both feet   Recurrent UTI    Renal disorder     Past Surgical History:  Procedure Laterality Date   abscess removal     excision of bartholin cyst   CAPD INSERTION N/A 06/09/2022   Procedure: LAPAROSCOPIC INSERTION CONTINUOUS AMBULATORY PERITONEAL DIALYSIS  (CAPD) CATHETER, PD rep to be present;  Surgeon: Olean Ree, MD;  Location: [facility];  Service: General;  Laterality: N/A;   CAPD REVISION N/A 07/16/2022   Procedure: LAPAROSCOPIC REVISION CONTINUOUS AMBULATORY PERITONEAL DIALYSIS  (CAPD) CATHETER;  Surgeon: Olean Ree, MD;  Location: [facility];  Service: General;  Laterality: N/A;   DIALYSIS/PERMA CATHETER INSERTION N/A 04/24/2022   Procedure: DIALYSIS/PERMA CATHETER INSERTION;  Surgeon: Katha Cabal, MD;  Location: [place] [facility];  Service: Cardiovascular;  Laterality: N/A;   DIALYSIS/PERMA CATHETER REMOVAL N/A 06/02/2022   Procedure: DIALYSIS/PERMA CATHETER REMOVAL;  Surgeon: Katha Cabal, MD;  Location: [place] [facility];  Service: Cardiovascular;  Laterality: N/A;   INSERTION OF DIALYSIS CATHETER Right    ARM   Nexplanon  [id number]   UMBILICAL HERNIA REPAIR N/A 06/09/2022   Procedure: HERNIA REPAIR UMBILICAL ADULT;  Surgeon: Olean Ree, MD;  Location: [facility];  Service: General;  Laterality: N/A;    Prior to  Admission medications   Medication Sig Start Date End Date Taking? Authorizing Provider  amLODipine (NORVASC) 10 MG tablet Take 1 tablet (10 mg total) by mouth daily. 04/28/22  Yes Jennye Boroughs, MD  atorvastatin (LIPITOR) 40 MG tablet Take 80 mg by mouth daily. 01/30/22  Yes [provider]  bumetanide (BUMEX) 1 MG tablet Take 1 mg by mouth 2 (two) times daily. 07/13/22  Yes [provider]  ferrous gluconate (FERGON) 324 MG tablet Take 324 mg by mouth daily with breakfast. 05/03/20  Yes [provider]  furosemide (LASIX) 80 MG tablet Take 80 mg by mouth daily. 08/10/22  Yes [provider]  insulin lispro (HUMALOG) 100 UNIT/ML injection Inject 5-10 Units into the skin 3 (three) times daily before meals. Plus sliding scale 1 unit per 50 > 150   Yes [provider]  losartan (COZAAR) 100 MG tablet Take 100 mg by mouth daily. 06/04/22  Yes [provider]  montelukast (SINGULAIR) 10 MG tablet Take 10 mg by mouth daily. 04/19/22  Yes [provider]  multivitamin (RENA-VIT) TABS tablet Take 1 tablet by mouth daily. 10/30/22  Yes [provider]  acetaminophen (TYLENOL) 500 MG tablet Take 2 tablets (1,000 mg total) by mouth every 6 (six) hours as needed for mild pain. 07/16/22   Olean Ree, MD  JUNEL 1/20 1-20 MG-MCG tablet Take 1 tablet by mouth daily. 04/17/22   [provider]  oxyCODONE (OXY IR/ROXICODONE) 5 MG immediate release tablet Take 1 tablet (5 mg total) by mouth every 4 (four) hours as  needed for severe pain. Patient not taking: Reported on 08/10/2022 07/16/22   Olean Ree, MD  predniSONE (DELTASONE) 20 MG tablet Take 1 tablet (20 mg total) by mouth daily with breakfast. Patient not taking: Reported on 11/20/2022 08/10/22   Nena Polio, MD  SUMAtriptan (IMITREX) 50 MG tablet Take 50 mg by mouth daily as needed for migraine. 11/11/21   [provider]  Vitamin D, Ergocalciferol, (DRISDOL) 1.25 MG (50000  UNIT) CAPS capsule Take 50,000 Units by mouth once a week. 02/05/22   [provider]    Current Facility-Administered Medications  Medication Dose Route Frequency Provider Last Rate Last Admin   acetaminophen (TYLENOL) tablet 650 mg  650 mg Oral Q6H PRN Jose Persia, MD       Or   acetaminophen (TYLENOL) suppository 650 mg  650 mg Rectal Q6H PRN Jose Persia, MD       amLODipine (NORVASC) tablet 10 mg  10 mg Oral Daily Jose Persia, MD   10 mg at 11/21/22 0956   bumetanide (BUMEX) tablet 1 mg  1 mg Oral BID Jose Persia, MD   1 mg at 11/21/22 0956   cefTRIAXone (ROCEPHIN) 2 g in sodium chloride 0.9 % 100 mL IVPB  2 g Intravenous Q24H Poggi, Jenna E, PA-C 200 mL/hr at 11/21/22 1247 2 g at 11/21/22 1247   dialysis solution 1.5% low-MG/low-CA dianeal solution   Intraperitoneal Q24H Breeze, Shantelle, NP       gentamicin cream (GARAMYCIN) 0.1 % 1 Application  1 Application Topical Daily Breeze, Shantelle, NP       heparin injection 5,000 Units  5,000 Units Subcutaneous Q8H Jose Persia, MD   5,000 Units at 11/21/22 1007   insulin aspart (novoLOG) injection 0-5 Units  0-5 Units Subcutaneous QHS Jose Persia, MD       insulin aspart (novoLOG) injection 0-9 Units  0-9 Units Subcutaneous TID WC Jose Persia, MD   1 Units at 11/21/22 0949   insulin aspart (novoLOG) injection 3 Units  3 Units Subcutaneous TID WC Jose Persia, MD   3 Units at 11/21/22 1248   insulin glargine-yfgn (SEMGLEE) injection 14 Units  14 Units Subcutaneous Q24H Jose Persia, MD   14 Units at 11/20/22 1700   losartan (COZAAR) tablet 100 mg  100 mg Oral Daily Jose Persia, MD   100 mg at 11/21/22 0956   metroNIDAZOLE (FLAGYL) IVPB 500 mg  500 mg Intravenous Q12H Poggi, Jenna E, PA-C 100 mL/hr at 11/21/22 1348 500 mg at 11/21/22 1348   montelukast (SINGULAIR) tablet 10 mg  10 mg Oral QHS Jose Persia, MD   10 mg at 11/21/22 0152   multivitamin with minerals tablet 1 tablet  1 tablet Oral Daily  Jose Persia, MD   1 tablet at 11/21/22 0956   ondansetron (ZOFRAN) tablet 4 mg  4 mg Oral Q6H PRN Jose Persia, MD       Or   ondansetron (ZOFRAN) injection 4 mg  4 mg Intravenous Q6H PRN Jose Persia, MD       polyethylene glycol (MIRALAX / GLYCOLAX) packet 17 g  17 g Oral Daily PRN Jose Persia, MD        Allergies as of 11/20/2022   (No Known Allergies)    Family History  Problem Relation Age of Onset   Breast cancer Mother 61   Lung cancer Maternal Grandmother    Diabetes type II Paternal Grandmother     Social History   Socioeconomic History   Marital status: Married  Spouse name: Cameo   Number of children: 1   Years of education: Not on file   Highest education level: Not on file  Occupational History   Not on file  Tobacco Use   Smoking status: Never    Passive exposure: Past   Smokeless tobacco: Never  Vaping Use   Vaping Use: Never used  Substance and Sexual Activity   Alcohol use: Not Currently    Alcohol/week: 2.0 standard drinks of alcohol    Types: 2 Shots of liquor per week   Drug use: No   Sexual activity: Yes    Birth control/protection: None  Other Topics Concern   Not on file  Social History Narrative   Not on file   Social Determinants of Health   Financial Resource Strain: Not on file  Food Insecurity: No Food Insecurity (11/21/2022)   Hunger Vital Sign    Worried About Running Out of Food in the Last Year: Never true    Ran Out of Food in the Last Year: Never true  Transportation Needs: No Transportation Needs (11/21/2022)   PRAPARE - Hydrologist (Medical): No    Lack of Transportation (Non-Medical): No  Physical Activity: Not on file  Stress: Not on file  Social Connections: Not on file  Intimate Partner Violence: Not At Risk (11/21/2022)   Humiliation, Afraid, Rape, and Kick questionnaire    Fear of Current or Ex-Partner: No    Emotionally Abused: No    Physically Abused: No    Sexually  Abused: No    Physical Exam: Vital signs in last 24 hours: Temp:  [98.2 F (36.8 C)-99.4 F (37.4 C)] 99.4 F (37.4 C) (01/06 0844) Pulse Rate:  [98-106] 104 (01/06 0844) Resp:  [18-20] 18 (01/06 0844) BP: (118-122)/(78-82) 118/81 (01/06 0844) SpO2:  [100 %] 100 % (01/06 0844) Last BM Date : 11/21/22 General:   Alert,  Well-developed, well-nourished, pleasant and cooperative in NAD Head:  Normocephalic and atraumatic. Eyes:  Sclera clear, no icterus.   Conjunctiva pink. Ears:  Normal auditory acuity. Nose:  No deformity, discharge,  or lesions. Lungs:  Clear throughout to auscultation.   No wheezes, crackles, or rhonchi. No acute distress. Heart:  Regular rate and rhythm; no murmurs, clicks, rubs,  or gallops. Abdomen:  Soft, nontender and nondistended. No masses, hepatosplenomegaly or hernias noted. Normal bowel sounds, without guarding, and without rebound.   Extremities: Nonhealing toe ulcers.  Intake/Output from previous day: 01/05 0701 - 01/06 0700 In: 263.3 [IV Piggyback:263.3] Out: -  Intake/Output this shift: Total I/O In: 600 [P.O.:600] Out: -   Lab Results: Recent Labs    11/20/22 1107 11/21/22 0516  WBC 14.7* 14.0*  HGB 8.6* 7.4*  HCT 27.6* 22.7*  PLT 490* 411*   BMET Recent Labs    11/20/22 1107 11/20/22 1949 11/21/22 0516  NA 132* 133* 132*  K 2.5* 2.9* 3.0*  CL 96* 96* 99  CO2 24 22 21*  GLUCOSE 162* 152* 140*  BUN 36* 40* 41*  CREATININE 5.91* 6.29* 6.45*  CALCIUM 7.4* 7.4* 7.2*   LFT Recent Labs    11/21/22 0516  PROT 6.4*  ALBUMIN 1.8*  AST 53*  ALT 50*  ALKPHOS 67  BILITOT 0.8   PT/INR No results for input(s): "LABPROT", "INR" in the last 72 hours. Hepatitis Panel No results for input(s): "HEPBSAG", "HCVAB", "HEPAIGM", "HEPBIGM" in the last 72 hours.  Studies/Results: MRI Left foot without contrast  Result Date: 11/20/2022 CLINICAL DATA:  Great toe pain and swelling, clinical concern for osteomyelitis based on radiography.  EXAM: MRI OF THE LEFT FOOT WITHOUT CONTRAST TECHNIQUE: Multiplanar, multisequence MR imaging of the left forefoot was performed. No intravenous contrast was administered. COMPARISON:  Radiographs 11/20/2022 FINDINGS: Bones/Joint/Cartilage Severe marrow edema and cortical indistinctness in the distal phalanx great toe compatible with active osteomyelitis. Endosteal edema in the proximal phalanx of the great toe most notable along the plantar and proximal margins for example on image 8 series 11, reactive findings versus or early osteomyelitis. There is abnormal edema in both sesamoid bones compatible with sesamoiditis, infection not readily excluded. Degenerative arthropathy at the articulation of the navicular and medial cuneiform as on image 14 series 8. There is a dorsal effusion of the first MTP joint and to a lesser extent of the interphalangeal joint of the great toe. Ligaments Lisfranc ligament intact Muscles and Tendons Diffuse muscular edema in the region is likely neurogenic. Soft tissues Abnormal subcutaneous inflammatory process along the great toe plantar to the phalanges and distal metatarsal, primarily infiltrative although incipient abscess formation is difficult to exclude. Dorsal subcutaneous edema in the forefoot tracking into the base of the toes, cellulitis not excluded. Tiny focus of metal artifact plantar to the fifth MTP joint on image 22 series 5, no radiographic abnormality visible, possibly a microscopic metallic particle. IMPRESSION: 1. Active osteomyelitis of the distal phalanx great toe. 2. Endosteal edema in the proximal phalanx of the great toe most notable along the plantar and proximal margins, reactive findings versus early osteomyelitis. 3. Abnormal edema in both sesamoid bones compatible with sesamoiditis, infection not readily excluded. 4. Dorsal effusion of the first MTP joint and to a lesser extent of the interphalangeal joint of the great toe, septic joint not excluded. 5.  Abnormal subcutaneous inflammatory process along the plantar aspect of the great toe, primarily infiltrative at this time without a definite drainable abscess. 6. Dorsal subcutaneous edema in the forefoot tracking into the base of the toes, cellulitis not excluded. 7. Diffuse muscular edema in the forefoot is likely neurogenic. 8. Degenerative arthropathy at the articulation of the navicular and medial cuneiform. 9. Tiny focus of metal artifact plantar to the fifth MTP joint, no radiographic abnormality visible, possibly a microscopic metallic particle. Electronically Signed   By: Van Clines M.D.   On: 11/20/2022 14:25   DG Foot Complete Left  Result Date: 11/20/2022 CLINICAL DATA:  Left foot and great toe pain and swelling. Patient reports the nail of her great toe falling off months ago. EXAM: LEFT FOOT - COMPLETE 3+ VIEW COMPARISON:  None Available. FINDINGS: The bones are demineralized. There is cortical destruction of the distal 1st phalanx which is highly suspicious for osteomyelitis. The proximal phalanx appears intact. The additional digits and metatarsals appear intact. There are moderately advanced degenerative changes at the articulation between the medial cuneiform and the navicular. There is medial forefoot soft tissue swelling without evidence of foreign body or soft tissue emphysema. IMPRESSION: 1. Cortical destruction of the distal 1st phalanx is highly suspicious for osteomyelitis. 2. Medial forefoot soft tissue swelling without evidence of foreign body or soft tissue emphysema. Electronically Signed   By: Richardean Sale M.D.   On: 11/20/2022 13:10    Assessment/Plan:  27 year old female with history of type 1 diabetes, end-stage renal disease on peritoneal dialysis, hypertension now admitted with history of left toe ulceration.  She had an MRI showing osteomyelitis.  She was found to have hypokalemia and had multiple potassium supplements.  ESRD  on peritoneal dialysis: Will  initiate on CCPD tonight.  Orders as written.  ANEMIA: Will continue anemia protocol.  MBD: Will monitor PTH, calcium and phosphorus levels.  HTN/VOL: Blood pressure is well-controlled.  Continue losartan and amlodipine.  ACCESS: PD catheter has been working well.  Hypokalemia: Supplement potassium as ordered.  Will hold the bumetanide for now.  Check labs in AM.  Osteomyelitis: Continue antibiotics with Rocephin and metronidazole.  Follow-up as per surgery.   LOS: [place], MD [facility] @TODAY @2 :12 PM

## 2022-11-21 NOTE — Plan of Care (Addendum)
Patient received from  1A,alert and orientedx4  on room air with belongings at bedside.Telemetry on,safety measures in place and call bell within reach.  02.36AM PD machine indicating malfunction,patient verbalized its due to the site it does the same when at [facility] nurse informed,HD nurse informed and PD discontinued,patient got 3 cycles.  Problem: Education: Goal: Ability to describe self-care measures that may prevent or decrease complications (Diabetes Survival Skills Education) will improve Outcome: Progressing Goal: Individualized Educational Video(s) Outcome: Progressing   Problem: Coping: Goal: Ability to adjust to condition or change in health will improve Outcome: Progressing   Problem: Fluid Volume: Goal: Ability to maintain a balanced intake and output will improve Outcome: Progressing   Problem: Health Behavior/Discharge Planning: Goal: Ability to identify and utilize available resources and services will improve Outcome: Progressing Goal: Ability to manage health-related needs will improve Outcome: Progressing   Problem: Metabolic: Goal: Ability to maintain appropriate glucose levels will improve Outcome: Progressing   Problem: Nutritional: Goal: Maintenance of adequate nutrition will improve Outcome: Progressing Goal: Progress toward achieving an optimal weight will improve Outcome: Progressing   Problem: Skin Integrity: Goal: Risk for impaired skin integrity will decrease Outcome: Progressing   Problem: Tissue Perfusion: Goal: Adequacy of tissue perfusion will improve Outcome: Progressing   Problem: Education: Goal: Knowledge of General Education information will improve Description: Including pain rating scale, medication(s)/side effects and non-pharmacologic comfort measures Outcome: Progressing   Problem: Health Behavior/Discharge Planning: Goal: Ability to manage health-related needs will improve Outcome: Progressing   Problem: Clinical  Measurements: Goal: Ability to maintain clinical measurements within normal limits will improve Outcome: Progressing Goal: Will remain free from infection Outcome: Progressing Goal: Diagnostic test results will improve Outcome: Progressing Goal: Respiratory complications will improve Outcome: Progressing Goal: Cardiovascular complication will be avoided Outcome: Progressing   Problem: Activity: Goal: Risk for activity intolerance will decrease Outcome: Progressing   Problem: Nutrition: Goal: Adequate nutrition will be maintained Outcome: Progressing   Problem: Pain Managment: Goal: General experience of comfort will improve Outcome: Progressing   Problem: Safety: Goal: Ability to remain free from injury will improve Outcome: Progressing   Problem: Skin Integrity: Goal: Risk for impaired skin integrity will decrease Outcome: Progressing

## 2022-11-21 NOTE — Progress Notes (Signed)
Labs appear more stable today.  Will plan for surgery tomorrow at 0730, patient to be NPO at 2330 today for procedure tomorrow.  Discussed with nursing staff.  Orders placed.  Will update H&P prior to case tomorrow morning and discuss further with patient.

## 2022-11-21 NOTE — Progress Notes (Signed)
Delay in PD treatment d/t patient not in a private room. Staff in process of finding private room to move patient to.

## 2022-11-21 NOTE — Hospital Course (Addendum)
27 year old female with history of type 1 diabetes, end-stage renal disease on peritoneal dialysis, hypertension and anemia presents with left first toe being swollen and found to have osteomyelitis. 1/7.  Surgery canceled today secondary to hypokalemia.  Replacing potassium orally.  Patient also stated that her peritoneal dialysis has not been working well even at home. 1/8.  Dr. Cleda Mccreedy podiatry did a amputation of the left hallux with partial first ray resection and incision and drainage of abscess left forefoot.

## 2022-11-21 NOTE — TOC Initial Note (Signed)
Transition of Care [facility]) - Initial/Assessment Note    Patient Details  Name: Emma Thomas MRN: [id number] Date of Birth: [birth year]  Transition of Care [facility]) CM/SW Contact:    Magnus Ivan, LCSW Phone Number: 11/21/2022, 11:46 AM  Clinical Narrative:                 CSW completed readmission risk assessment with patient.  Patient lives with her husband. Patient is able to drive. Patient states she has no PCP, but declines PCP resource needs.  Pharmacy is [place]. Patient states she does PD at home. No other DME/HH/SNF history. Patient denies TOC needs at this time.    Expected Discharge Plan: Home/Self Care Barriers to Discharge: Continued Medical Work up   Patient Goals and CMS Choice Patient states their goals for this hospitalization and ongoing recovery are:: home with spouse CMS Medicare.gov Compare Post Acute Care list provided to:: Patient Choice offered to / list presented to : Patient      Expected Discharge Plan and Services       Living arrangements for the past 2 months: Single Family Home                                      Prior Living Arrangements/Services Living arrangements for the past 2 months: Single Family Home Lives with:: Spouse Patient language and need for interpreter reviewed:: Yes        Need for Family Participation in Patient Care: Yes (Comment) Care giver support system in place?: Yes (comment)   Criminal Activity/Legal Involvement Pertinent to Current Situation/Hospitalization: No - Comment as needed  Activities of Daily Living Home Assistive Devices/Equipment: External Monitoring Devices (Insulin pump) ADL Screening (condition at time of admission) Patient's cognitive ability adequate to safely complete daily activities?: Yes Is the patient deaf or have difficulty hearing?: No Does the patient have difficulty seeing, even when wearing glasses/contacts?: No Does the patient have difficulty  concentrating, remembering, or making decisions?: No Patient able to express need for assistance with ADLs?: Yes Does the patient have difficulty dressing or bathing?: No Independently performs ADLs?: Yes (appropriate for developmental age) Communication: Independent Dressing (OT): Independent Grooming: Independent Feeding: Independent Bathing: Independent Toileting: Independent In/Out Bed: Independent Walks in Home: Independent Does the patient have difficulty walking or climbing stairs?: Yes Weakness of Legs: Left Weakness of Arms/Hands: Left (foot)  Permission Sought/Granted                  Emotional Assessment       Orientation: : Oriented to Self, Oriented to Situation, Oriented to Place, Oriented to  Time Alcohol / Substance Use: Not Applicable Psych Involvement: No (comment)  Admission diagnosis:  Hypokalemia [E87.6] Acute osteomyelitis ([place]) [M86.10] Osteomyelitis of left foot, unspecified type ([place]) [M86.9] Patient Active Problem List   Diagnosis Date Noted   Acute osteomyelitis ([place]) 11/20/2022   Hypokalemia 11/20/2022   PD catheter dysfunction ([place])    Umbilical hernia without obstruction and without gangrene    ESRD on dialysis ([place]) 04/22/2022   Nausea and vomiting 04/19/2022   Labor and delivery indication for care or intervention 02/13/2020   Pyelonephritis affecting pregnancy in first trimester 11/01/2019   Supervision of high risk pregnancy, antepartum 11/01/2019   Depression 10/27/2019   Diabetes mellitus ([place]) 10/27/2019   History of chlamydia infection 10/27/2019   History of depression 10/27/2019   Vitamin D deficiency  10/27/2019   Liver function abnormality 08/15/2018   Microalbuminuric diabetic nephropathy ([place]) 08/15/2018   High anion gap metabolic acidosis    AKI (acute kidney injury) ([place])    Abdominal pain    Pyelonephritis 07/30/2016   DKA (diabetic ketoacidoses) 09/09/2015   History of gonorrhea 08/13/2015   Intrauterine device  05/03/2015   Neuropathy 04/13/2013   Weight loss, non-intentional 04/13/2013   Depression, major, recurrent, moderate ([place]) 03/06/2013   Hypertension 03/04/2013   PCP:  Margarita Rana, MD Pharmacy:   [id number] [address], [place] [address] Phone: [phone] Fax: [phone]     Social Determinants of Health (SDOH) Social History: [place]: No Food Insecurity (11/21/2022)  Housing: Low Risk  (11/21/2022)  Transportation Needs: No Transportation Needs (11/21/2022)  Utilities: Not At Risk (11/21/2022)  Tobacco Use: Low Risk  (11/20/2022)   SDOH Interventions:     Readmission Risk Interventions    11/21/2022   11:46 AM 04/24/2022    1:30 PM  Readmission Risk Prevention Plan  Transportation Screening Complete Complete  PCP or Specialist Appt within 3-5 Days Complete Complete  HRI or [place] Complete Complete  Social Work Consult for [place] Planning/Counseling Complete Complete  Palliative Care Screening Not Applicable Not Applicable  Medication Review Press photographer) Complete Complete

## 2022-11-21 NOTE — Plan of Care (Signed)
  Problem: Skin Integrity: Goal: Risk for impaired skin integrity will decrease Outcome: Progressing   Problem: Activity: Goal: Risk for activity intolerance will decrease Outcome: Progressing   Problem: Pain Managment: Goal: General experience of comfort will improve Outcome: Progressing   

## 2022-11-21 NOTE — Progress Notes (Signed)
  Progress Note   Patient: Emma Thomas LSL:[id number] DOB: 06-Apr-1996 DOA: 11/20/2022     1 DOS: the patient was seen and examined on 11/21/2022   Brief hospital course: 27 year old female with history of type 1 diabetes, end-stage renal disease on peritoneal dialysis, hypertension and anemia presents with left first toe being swollen and found to have osteomyelitis.  Assessment and Plan: * Acute osteomyelitis ([place]) Osteomyelitis left first toe.  Podiatry to take to the operating room on 1/7.  Received a dose of vancomycin.  On IV Flagyl and ceftriaxone.  Hypokalemia Potassium replacement and peritoneal dialysis to be started tonight.  Holding Bumex.  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  Since patient will be n.p.o. after midnight will cut back on Semglee insulin to 8 units tonight and use short acting insulin prior to meals.  ESRD on dialysis [facility]) Nephrology to start peritoneal dialysis tonight.  Anasarca Swelling bilateral lower extremities likely secondary to low albumin and end-stage renal disease.  Anemia of chronic disease Last hemoglobin 7.4.  May end up needing a transfusion during the hospital course of drop setting further.        Subjective: Patient had an acrylic nail and she pulled it off and her entire nail came off.  She has been picking at the skin and her toe became swollen.  Now has osteomyelitis.  Physical Exam: Vitals:   11/20/22 1806 11/20/22 2344 11/21/22 0012 11/21/22 0844  BP: 122/78 119/82 118/78 118/81  Pulse: 98 (!) 102 (!) 106 (!) 104  Resp: 20 20 18 18   Temp: 98.2 F (36.8 C) 98.6 F (37 C) 98.6 F (37 C) 99.4 F (37.4 C)  TempSrc: Oral     SpO2: 100% 100% 100% 100%  Weight:      Height:       Physical Exam HENT:     Head: Normocephalic.     Mouth/Throat:     Pharynx: No oropharyngeal exudate.  Eyes:     General: Lids are normal.     Conjunctiva/sclera:  Conjunctivae normal.  Cardiovascular:     Rate and Rhythm: Normal rate and regular rhythm.     Heart sounds: Normal heart sounds, S1 normal and S2 normal.  Pulmonary:     Breath sounds: No decreased breath sounds, wheezing, rhonchi or rales.  Abdominal:     Palpations: Abdomen is soft.     Tenderness: There is no abdominal tenderness.  Musculoskeletal:     Right lower leg: Swelling present.     Left lower leg: Swelling present.  Skin:    General: Skin is warm.     Comments: Left first toe swollen.  Neurological:     Mental Status: She is alert and oriented to person, place, and time.     Data Reviewed: Hemoglobin 7.4, potassium 3.0, sodium 132, creatinine 6.45, albumin 1.8  Disposition: Status is: Inpatient Remains inpatient appropriate because: Patient will go to the operating room on 1/7  Planned Discharge Destination: Home    Time spent: 28 minutes  Author: Loletha Grayer, MD 11/21/2022 4:10 PM  For on call review [email or web address].

## 2022-11-21 NOTE — Assessment & Plan Note (Addendum)
Last hemoglobin 7.9.

## 2022-11-21 NOTE — Assessment & Plan Note (Signed)
Swelling bilateral lower extremities likely secondary to low albumin and end-stage renal disease.

## 2022-11-22 DIAGNOSIS — E876 Hypokalemia: Secondary | ICD-10-CM | POA: Diagnosis not present

## 2022-11-22 DIAGNOSIS — M8610 Other acute osteomyelitis, unspecified site: Secondary | ICD-10-CM | POA: Diagnosis not present

## 2022-11-22 DIAGNOSIS — E871 Hypo-osmolality and hyponatremia: Secondary | ICD-10-CM | POA: Insufficient documentation

## 2022-11-22 DIAGNOSIS — N186 End stage renal disease: Secondary | ICD-10-CM | POA: Diagnosis not present

## 2022-11-22 DIAGNOSIS — E1022 Type 1 diabetes mellitus with diabetic chronic kidney disease: Secondary | ICD-10-CM | POA: Diagnosis not present

## 2022-11-22 LAB — BASIC METABOLIC PANEL
Anion gap: 13 (ref 5–15)
Anion gap: 12 (ref 5–15)
BUN: 42 mg/dL — ABNORMAL HIGH (ref 6–20)
BUN: 43 mg/dL — ABNORMAL HIGH (ref 6–20)
CO2: 21 mmol/L — ABNORMAL LOW (ref 22–32)
CO2: 22 mmol/L (ref 22–32)
Calcium: 7.7 mg/dL — ABNORMAL LOW (ref 8.9–10.3)
Calcium: 7.7 mg/dL — ABNORMAL LOW (ref 8.9–10.3)
Chloride: 99 mmol/L (ref 98–111)
Chloride: 100 mmol/L (ref 98–111)
Creatinine, Ser: 6.69 mg/dL — ABNORMAL HIGH (ref 0.44–1.00)
Creatinine, Ser: 7.33 mg/dL — ABNORMAL HIGH (ref 0.44–1.00)
GFR, Estimated: 8 mL/min — ABNORMAL LOW (ref >60)
GFR, Estimated: 7 mL/min — ABNORMAL LOW (ref >60)
Glucose, Bld: 206 mg/dL — ABNORMAL HIGH (ref 70–99)
Glucose, Bld: 106 mg/dL — ABNORMAL HIGH (ref 70–99)
Potassium: 2.8 mmol/L — ABNORMAL LOW (ref 3.5–5.1)
Potassium: 3 mmol/L — ABNORMAL LOW (ref 3.5–5.1)
Sodium: 133 mmol/L — ABNORMAL LOW (ref 135–145)
Sodium: 134 mmol/L — ABNORMAL LOW (ref 135–145)

## 2022-11-22 LAB — GLUCOSE, CAPILLARY
Glucose-Capillary: 188 mg/dL — ABNORMAL HIGH (ref 70–99)
Glucose-Capillary: 189 mg/dL — ABNORMAL HIGH (ref 70–99)
Glucose-Capillary: 116 mg/dL — ABNORMAL HIGH (ref 70–99)
Glucose-Capillary: 106 mg/dL — ABNORMAL HIGH (ref 70–99)

## 2022-11-22 LAB — PHOSPHORUS: Phosphorus: 4.7 mg/dL — ABNORMAL HIGH (ref 2.5–4.6)

## 2022-11-22 LAB — MAGNESIUM: Magnesium: 2 mg/dL (ref 1.7–2.4)

## 2022-11-22 LAB — HEMOGLOBIN: Hemoglobin: 7.9 g/dL — ABNORMAL LOW (ref 12.0–15.0)

## 2022-11-22 MED ORDER — VANCOMYCIN VARIABLE DOSE PER UNSTABLE RENAL FUNCTION (PHARMACIST DOSING): Status: DC

## 2022-11-22 MED ORDER — POTASSIUM CHLORIDE CRYS ER 20 MEQ PO TBCR
40.0000 meq | EXTENDED_RELEASE_TABLET | Freq: Once | ORAL | Status: AC
  Administered 2022-11-22: 40 meq via ORAL
  Filled 2022-11-22: qty 2

## 2022-11-22 MED ORDER — OXYCODONE HCL 5 MG PO TABS
5.0000 mg | ORAL_TABLET | Freq: Four times a day (QID) | ORAL | Status: DC | PRN
  Administered 2022-11-22: 5 mg via ORAL
  Filled 2022-11-22: qty 1

## 2022-11-22 MED ORDER — POTASSIUM CHLORIDE 10 MEQ/100ML IV SOLN
10.0000 meq | INTRAVENOUS | Status: AC
  Administered 2022-11-22 – 2022-11-23 (×2): 10 meq via INTRAVENOUS
  Filled 2022-11-22 (×2): qty 100

## 2022-11-22 NOTE — Progress Notes (Signed)
Pt PD not successful. Reasons unknown due to PD program settings and treatment deleted from PD machine by patient and PD machine off when this RN was present. Pt said her PD catheter does not work on her PD cycler at home either and she is supposed to have surgery tomorrow (1/8) on her PD catheter, but is now in the hospital. Ellender Hose, MD. Pt refused this RN to perform catheter dressing change and clean site. Pt wants to do dressing change herself. Supplies and education on PD dressing change provided to patient. Alert, no c/o, report to primary RN.

## 2022-11-22 NOTE — Progress Notes (Signed)
  Progress Note   Patient: Emma Thomas WUX:[id number] DOB: [birth year] DOA: 11/20/2022     2 DOS: the patient was seen and examined on 11/22/2022   Brief hospital course: 27 year old female with history of type 1 diabetes, end-stage renal disease on peritoneal dialysis, hypertension and anemia presents with left first toe being swollen and found to have osteomyelitis. 1/7.  Surgery canceled today secondary to hypokalemia.  Replacing potassium orally.  Patient also stated that her peritoneal dialysis has not been working well even at home.  Assessment and Plan: * Acute osteomyelitis ([place]) Osteomyelitis left first toe.  Podiatry to take to the operating room on 1/8.  Surgery canceled today secondary to hypokalemia.  Received a dose of vancomycin.  Pharmacy to dose vancomycin.  On IV Flagyl and ceftriaxone.  Hypokalemia Replace potassium twice today.  Holding water pills.  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  Since patient will be n.p.o. after midnight will cut back on Semglee insulin to 8 units tonight and use short acting insulin prior to meals.  ESRD on dialysis [facility]) Nephrology to continue peritoneal dialysis.  I will contact Dr. Hampton Abbot tomorrow morning about her PD catheter giving her some issues.  Hyponatremia Sodium a couple points lower than normal range  Anasarca Swelling bilateral lower extremities likely secondary to low albumin and end-stage renal disease.  Anemia of chronic disease Last hemoglobin 7.9.        Subjective: Patient states that the peritoneal dialysis only went halfway last night.  She states even at home and has not been going well for the last week.  Admitted with toe osteomyelitis.  Physical Exam: Vitals:   11/21/22 1731 11/21/22 1959 11/21/22 2210 11/22/22 0742  BP: 115/76 109/71 113/73 114/71  Pulse: 100 (!) 102 95 97  Resp: 18 18 18 17   Temp: 100 F (37.8 C) 99.1 F (37.3 C)  99 F (37.2 C) 98.6 F (37 C)  TempSrc:   Oral   SpO2: 100% 100% 100% 97%  Weight:      Height:       Physical Exam HENT:     Head: Normocephalic.     Mouth/Throat:     Pharynx: No oropharyngeal exudate.  Eyes:     General: Lids are normal.     Conjunctiva/sclera: Conjunctivae normal.  Cardiovascular:     Rate and Rhythm: Normal rate and regular rhythm.     Heart sounds: Normal heart sounds, S1 normal and S2 normal.  Pulmonary:     Breath sounds: No decreased breath sounds, wheezing, rhonchi or rales.  Abdominal:     Palpations: Abdomen is soft.     Tenderness: There is no abdominal tenderness.  Musculoskeletal:     Right lower leg: Swelling present.     Left lower leg: Swelling present.  Skin:    General: Skin is warm.     Comments: Left first toe swollen.  Neurological:     Mental Status: She is alert and oriented to person, place, and time.     Data Reviewed: Potassium 2.8, creatinine 6.69, sodium 133, hemoglobin 7.9  Disposition: Status is: Inpatient Remains inpatient appropriate because: Surgery canceled today secondary to hypokalemia  Planned Discharge Destination: Home    Time spent: 28 minutes  Author: Loletha Grayer, MD 11/22/2022 1:45 PM  For on call review [email or web address].

## 2022-11-22 NOTE — Progress Notes (Signed)
[facility]  PROGRESS NOTE   Subjective:     Objective:  Vital signs: Blood pressure 114/71, pulse 97, temperature 98.6 F (37 C), resp. rate 17, height 5\' 6"  (1.676 m), weight 69.9 kg, last menstrual period 11/11/2022, SpO2 97 %.  Intake/Output Summary (Last 24 hours) at 11/22/2022 1401 Last data filed at 11/22/2022 1030 Gross per 24 hour  Intake 120 ml  Output --  Net 120 ml   Filed Weights   11/20/22 1101  Weight: 69.9 kg     Physical Exam: General:  No acute distress  Head:  Normocephalic, atraumatic. Moist oral mucosal membranes  Eyes:  Anicteric  Neck:  Supple  Lungs:   Clear to auscultation, normal effort  Heart:  S1S2 no rubs  Abdomen:   Soft, nontender, bowel sounds present  Extremities:  peripheral edema.  Neurologic:  Awake, alert, following commands  Skin:  No lesions  Access:     Basic Metabolic Panel: Recent Labs  Lab 11/20/22 1107 11/20/22 1949 11/21/22 0516 11/22/22 0406  NA 132* 133* 132* 133*  K 2.5* 2.9* 3.0* 2.8*  CL 96* 96* 99 99  CO2 24 22 21* 21*  GLUCOSE 162* 152* 140* 206*  BUN 36* 40* 41* 42*  CREATININE 5.91* 6.29* 6.45* 6.69*  CALCIUM 7.4* 7.4* 7.2* 7.7*  MG  --  1.8  --  2.0  PHOS  --   --   --  4.7*   GFR: Estimated Creatinine Clearance: 11.7 mL/min (A) (by C-G formula based on SCr of 6.69 mg/dL (H)).  Liver Function Tests: Recent Labs  Lab 11/20/22 1107 11/21/22 0516  AST 74* 53*  ALT 63* 50*  ALKPHOS 79 67  BILITOT 0.9 0.8  PROT 7.8 6.4*  ALBUMIN 2.2* 1.8*   No results for input(s): "LIPASE", "AMYLASE" in the last 168 hours. No results for input(s): "AMMONIA" in the last 168 hours.  CBC: Recent Labs  Lab 11/20/22 1107 11/21/22 0516 11/22/22 0406  WBC 14.7* 14.0*  --   NEUTROABS 11.0* 11.2*  --   HGB 8.6* 7.4* 7.9*  HCT 27.6* 22.7*  --   MCV 85.4 84.7  --   PLT 490* 411*  --      HbA1C: Hgb A1c MFr Bld  Date/Time Value Ref Range Status  04/19/2022 05:05 PM 9.4 (H) 4.8 - 5.6 % Final     Comment:    (NOTE) Pre diabetes:          5.7%-6.4%  Diabetes:              >6.4%  Glycemic control for   <7.0% adults with diabetes   02/13/2020 02:39 PM 6.6 (H) 4.8 - 5.6 % Final    Comment:    (NOTE) Pre diabetes:          5.7%-6.4% Diabetes:              >6.4% Glycemic control for   <7.0% adults with diabetes     Urinalysis: No results for input(s): "COLORURINE", "LABSPEC", "PHURINE", "GLUCOSEU", "HGBUR", "BILIRUBINUR", "KETONESUR", "PROTEINUR", "UROBILINOGEN", "NITRITE", "LEUKOCYTESUR" in the last 72 hours.  Invalid input(s): "APPERANCEUR"    Imaging: MRI Left foot without contrast  Result Date: 11/20/2022 CLINICAL DATA:  Great toe pain and swelling, clinical concern for osteomyelitis based on radiography. EXAM: MRI OF THE LEFT FOOT WITHOUT CONTRAST TECHNIQUE: Multiplanar, multisequence MR imaging of the left forefoot was performed. No intravenous contrast was administered. COMPARISON:  Radiographs 11/20/2022 FINDINGS: Bones/Joint/Cartilage Severe marrow edema and cortical indistinctness in  the distal phalanx great toe compatible with active osteomyelitis. Endosteal edema in the proximal phalanx of the great toe most notable along the plantar and proximal margins for example on image 8 series 11, reactive findings versus or early osteomyelitis. There is abnormal edema in both sesamoid bones compatible with sesamoiditis, infection not readily excluded. Degenerative arthropathy at the articulation of the navicular and medial cuneiform as on image 14 series 8. There is a dorsal effusion of the first MTP joint and to a lesser extent of the interphalangeal joint of the great toe. Ligaments Lisfranc ligament intact Muscles and Tendons Diffuse muscular edema in the region is likely neurogenic. Soft tissues Abnormal subcutaneous inflammatory process along the great toe plantar to the phalanges and distal metatarsal, primarily infiltrative although incipient abscess formation is difficult to  exclude. Dorsal subcutaneous edema in the forefoot tracking into the base of the toes, cellulitis not excluded. Tiny focus of metal artifact plantar to the fifth MTP joint on image 22 series 5, no radiographic abnormality visible, possibly a microscopic metallic particle. IMPRESSION: 1. Active osteomyelitis of the distal phalanx great toe. 2. Endosteal edema in the proximal phalanx of the great toe most notable along the plantar and proximal margins, reactive findings versus early osteomyelitis. 3. Abnormal edema in both sesamoid bones compatible with sesamoiditis, infection not readily excluded. 4. Dorsal effusion of the first MTP joint and to a lesser extent of the interphalangeal joint of the great toe, septic joint not excluded. 5. Abnormal subcutaneous inflammatory process along the plantar aspect of the great toe, primarily infiltrative at this time without a definite drainable abscess. 6. Dorsal subcutaneous edema in the forefoot tracking into the base of the toes, cellulitis not excluded. 7. Diffuse muscular edema in the forefoot is likely neurogenic. 8. Degenerative arthropathy at the articulation of the navicular and medial cuneiform. 9. Tiny focus of metal artifact plantar to the fifth MTP joint, no radiographic abnormality visible, possibly a microscopic metallic particle. Electronically Signed   By: Van Clines M.D.   On: 11/20/2022 14:25     Medications:    cefTRIAXone (ROCEPHIN)  IV 2 g (11/22/22 1247)   dialysis solution 1.5% low-MG/low-CA     metronidazole 500 mg (11/22/22 1341)    amLODipine  10 mg Oral Daily   gentamicin cream  1 Application Topical Daily   heparin  5,000 Units Subcutaneous Q8H   insulin aspart  0-5 Units Subcutaneous QHS   insulin aspart  0-9 Units Subcutaneous TID WC   insulin aspart  3 Units Subcutaneous TID WC   insulin glargine-yfgn  8 Units Subcutaneous Q24H   losartan  100 mg Oral Daily   montelukast  10 mg Oral QHS   multivitamin with minerals  1  tablet Oral Daily   potassium chloride  40 mEq Oral Once   vancomycin variable dose per unstable renal function (pharmacist dosing)   Does not apply See admin instructions    Assessment/ Plan:     27 year old female with history of type 1 diabetes, end-stage renal disease on peritoneal dialysis, hypertension now admitted with history of left toe ulceration.  She had an MRI showing osteomyelitis.  She was found to have hypokalemia and had multiple potassium supplements.   ESRD on peritoneal dialysis: Patient has PD catheter malfunction.  Was not dialyzed so far.  As per the patient her surgeon is going to evaluate her tomorrow morning.     ANEMIA: Will continue anemia protocol.   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   HTN/VOL: Blood pressure is well-controlled.  Continue losartan and amlodipine.   ACCESS: PD catheter has not been working well.  To be evaluated in AM.   Hypokalemia: Supplement potassium as ordered.  Will hold the bumetanide for now.  Check labs in AM.  If the potassium is over 3.5 patient can go for surgery.   Acute osteomyelitis: Continue antibiotics with Rocephin and metronidazole.  Diabetes: Continue insulin doses as ordered.     LOS: [place], MD [facility] 1/7/20242:01 PM

## 2022-11-22 NOTE — Progress Notes (Signed)
Patient's surgery cancelled due to potassium being low per anesthesia.  As patient is not currently septic, will proceed tomorrow if stable.  If good tomorrow will plan for procedure at either 12pm or 4pm depending on OR availability.  NPO at midnight tonight.  Diet order placed for today.

## 2022-11-22 NOTE — Progress Notes (Signed)
Per MD, patient does not want PD until she speaks with surgeon tomorrow.

## 2022-11-22 NOTE — Plan of Care (Signed)

## 2022-11-22 NOTE — Assessment & Plan Note (Signed)
Sodium normal range ?

## 2022-11-22 NOTE — Progress Notes (Signed)
Pharmacy Antibiotic Note  Emma Thomas is a 27 y.o. female admitted on 11/20/2022 with acute osteomyelitis of left first toe. Patient has PMH of DM1 with ESRD, on peritoneal dialysis.  Pharmacy has been consulted for vancomycin dosing.  Patient received Vancomycin 1250mg  IV on 1/5.  Plan: Will order vancomycin random level with AM labs Pharmacist to review  level and order vancomycin as needed.   Height: 5\' 6"  (167.6 cm) Weight: 69.9 kg (154 lb) IBW/kg (Calculated) : 59.3  Temp (24hrs), Avg:99.2 F (37.3 C), Min:98.6 F (37 C), Max:100 F (37.8 C)  Recent Labs  Lab 11/20/22 1107 11/20/22 1638 11/20/22 1949 11/21/22 0516 11/22/22 0406  WBC 14.7*  --   --  14.0*  --   CREATININE 5.91*  --  6.29* 6.45* 6.69*  LATICACIDVEN  --  0.8  --   --   --     Estimated Creatinine Clearance: 11.7 mL/min (A) (by C-G formula based on SCr of 6.69 mg/dL (H)).    No Known Allergies  Antimicrobials this admission: 1/5 vancomycin  x 1 dose 1/5 Flagyl>> 1/5 ceftriaxone>>   Dose adjustments this admission:   Microbiology results: 1/5  BCx: NG x 2 days  Thank you for allowing pharmacy to be a part of this patient's care.  Pernell Dupre, PharmD, BCPS Clinical Pharmacist 11/22/2022 1:57 PM

## 2022-11-22 NOTE — Plan of Care (Signed)
  Problem: Health Behavior/Discharge Planning: Goal: Ability to manage health-related needs will improve Outcome: Progressing   

## 2022-11-23 ENCOUNTER — Inpatient Hospital Stay: Payer: Medicaid Other | Admitting: Certified Registered Nurse Anesthetist

## 2022-11-23 ENCOUNTER — Encounter
Admission: EM | Disposition: A | Discharge status: Home Health | Payer: Self-pay | Source: Home / Self Care | Attending: Internal Medicine

## 2022-11-23 ENCOUNTER — Inpatient Hospital Stay: Payer: Medicaid Other

## 2022-11-23 ENCOUNTER — Ambulatory Visit: Payer: Commercial Managed Care - PPO | Admitting: Surgery

## 2022-11-23 ENCOUNTER — Other Ambulatory Visit: Payer: Self-pay

## 2022-11-23 DIAGNOSIS — T85611A Breakdown (mechanical) of intraperitoneal dialysis catheter, initial encounter: Secondary | ICD-10-CM

## 2022-11-23 DIAGNOSIS — E1022 Type 1 diabetes mellitus with diabetic chronic kidney disease: Secondary | ICD-10-CM | POA: Diagnosis not present

## 2022-11-23 DIAGNOSIS — E876 Hypokalemia: Secondary | ICD-10-CM | POA: Diagnosis not present

## 2022-11-23 DIAGNOSIS — M8610 Other acute osteomyelitis, unspecified site: Secondary | ICD-10-CM | POA: Diagnosis not present

## 2022-11-23 HISTORY — PX: AMPUTATION: SHX166

## 2022-11-23 LAB — GLUCOSE, CAPILLARY
Glucose-Capillary: 88 mg/dL (ref 70–99)
Glucose-Capillary: 96 mg/dL (ref 70–99)
Glucose-Capillary: 85 mg/dL (ref 70–99)
Glucose-Capillary: 80 mg/dL (ref 70–99)
Glucose-Capillary: 81 mg/dL (ref 70–99)

## 2022-11-23 LAB — HEMOGLOBIN A1C
Hgb A1c MFr Bld: 7.9 % — ABNORMAL HIGH (ref 4.8–5.6)
Mean Plasma Glucose: 180 mg/dL

## 2022-11-23 LAB — BASIC METABOLIC PANEL
Anion gap: 12 (ref 5–15)
BUN: 42 mg/dL — ABNORMAL HIGH (ref 6–20)
CO2: 21 mmol/L — ABNORMAL LOW (ref 22–32)
Calcium: 7.7 mg/dL — ABNORMAL LOW (ref 8.9–10.3)
Chloride: 102 mmol/L (ref 98–111)
Creatinine, Ser: 7.12 mg/dL — ABNORMAL HIGH (ref 0.44–1.00)
GFR, Estimated: 7 mL/min — ABNORMAL LOW (ref >60)
Glucose, Bld: 112 mg/dL — ABNORMAL HIGH (ref 70–99)
Potassium: 3.5 mmol/L (ref 3.5–5.1)
Sodium: 135 mmol/L (ref 135–145)

## 2022-11-23 LAB — SURGICAL PCR SCREEN
MRSA, PCR: NEGATIVE
Staphylococcus aureus: NEGATIVE

## 2022-11-23 LAB — VANCOMYCIN, RANDOM: Vancomycin Rm: 11 ug/mL

## 2022-11-23 SURGERY — AMPUTATION, FOOT, RAY
Anesthesia: General | Laterality: Left

## 2022-11-23 MED ORDER — VANCOMYCIN HCL 1000 MG IV SOLR
INTRAVENOUS | Status: DC | PRN
  Administered 2022-11-23: 1000 mg

## 2022-11-23 MED ORDER — SODIUM CHLORIDE 0.9 % IV SOLN: INTRAVENOUS | Status: DC | PRN

## 2022-11-23 MED ORDER — LIDOCAINE HCL (CARDIAC) PF 100 MG/5ML IV SOSY
PREFILLED_SYRINGE | INTRAVENOUS | Status: DC | PRN
  Administered 2022-11-23: 100 mg via INTRAVENOUS

## 2022-11-23 MED ORDER — PROPOFOL 10 MG/ML IV BOLUS
INTRAVENOUS | Status: DC | PRN
  Administered 2022-11-23: 100 mg via INTRAVENOUS

## 2022-11-23 MED ORDER — OXYCODONE HCL 5 MG PO TABS
5.0000 mg | ORAL_TABLET | ORAL | Status: DC | PRN
  Administered 2022-11-23 – 2022-11-28 (×9): 5 mg via ORAL
  Filled 2022-11-23 (×10): qty 1

## 2022-11-23 MED ORDER — CHLORHEXIDINE GLUCONATE 4 % EX LIQD: 60.0000 mL | Freq: Once | CUTANEOUS | Status: DC

## 2022-11-23 MED ORDER — VANCOMYCIN HCL 1000 MG IV SOLR
INTRAVENOUS | Status: AC
  Filled 2022-11-23: qty 20

## 2022-11-23 MED ORDER — SODIUM CHLORIDE 0.9 % IV SOLN
12.5000 mg | Freq: Once | INTRAVENOUS | Status: AC
  Administered 2022-11-23: 12.5 mg via INTRAVENOUS
  Filled 2022-11-23: qty 12.5

## 2022-11-23 MED ORDER — OXYCODONE HCL 5 MG PO TABS
5.0000 mg | ORAL_TABLET | ORAL | Status: DC | PRN
  Administered 2022-11-23: 5 mg via ORAL
  Filled 2022-11-23: qty 1

## 2022-11-23 MED ORDER — ONDANSETRON HCL 4 MG/2ML IJ SOLN: 4.0000 mg | Freq: Once | INTRAMUSCULAR | Status: DC | PRN

## 2022-11-23 MED ORDER — OXYCODONE HCL 5 MG PO TABS: 5.0000 mg | ORAL_TABLET | Freq: Once | ORAL | Status: DC | PRN

## 2022-11-23 MED ORDER — PROPOFOL 500 MG/50ML IV EMUL
INTRAVENOUS | Status: DC | PRN
  Administered 2022-11-23: 20 ug/kg/min via INTRAVENOUS

## 2022-11-23 MED ORDER — BUPIVACAINE HCL (PF) 0.5 % IJ SOLN
INTRAMUSCULAR | Status: AC
  Filled 2022-11-23: qty 30

## 2022-11-23 MED ORDER — ONDANSETRON HCL 4 MG/2ML IJ SOLN
INTRAMUSCULAR | Status: DC | PRN
  Administered 2022-11-23: 4 mg via INTRAVENOUS

## 2022-11-23 MED ORDER — PROPOFOL 1000 MG/100ML IV EMUL
INTRAVENOUS | Status: AC
  Filled 2022-11-23: qty 100

## 2022-11-23 MED ORDER — PROCHLORPERAZINE EDISYLATE 10 MG/2ML IJ SOLN: 10.0000 mg | Freq: Four times a day (QID) | INTRAMUSCULAR | Status: DC | PRN

## 2022-11-23 MED ORDER — ACETAMINOPHEN 10 MG/ML IV SOLN: 1000.0000 mg | Freq: Once | INTRAVENOUS | Status: DC | PRN

## 2022-11-23 MED ORDER — FENTANYL CITRATE (PF) 100 MCG/2ML IJ SOLN: 25.0000 ug | INTRAMUSCULAR | Status: DC | PRN

## 2022-11-23 MED ORDER — 0.9 % SODIUM CHLORIDE (POUR BTL) OPTIME
TOPICAL | Status: DC | PRN
  Administered 2022-11-23: 3000 mL

## 2022-11-23 MED ORDER — MIDAZOLAM HCL 2 MG/2ML IJ SOLN
INTRAMUSCULAR | Status: AC
  Filled 2022-11-23: qty 2

## 2022-11-23 MED ORDER — BUPIVACAINE HCL 0.5 % IJ SOLN
INTRAMUSCULAR | Status: DC | PRN
  Administered 2022-11-23: 10 mL

## 2022-11-23 MED ORDER — FENTANYL CITRATE (PF) 100 MCG/2ML IJ SOLN
INTRAMUSCULAR | Status: AC
  Filled 2022-11-23: qty 2

## 2022-11-23 MED ORDER — BUPIVACAINE-EPINEPHRINE (PF) 0.5% -1:200000 IJ SOLN
INTRAMUSCULAR | Status: AC
  Filled 2022-11-23: qty 30

## 2022-11-23 MED ORDER — HYDROMORPHONE HCL 1 MG/ML IJ SOLN
0.5000 mg | INTRAMUSCULAR | Status: DC | PRN
  Administered 2022-11-23 – 2022-11-24 (×2): 0.5 mg via INTRAVENOUS
  Administered 2022-11-24: 1 mg via INTRAVENOUS
  Administered 2022-11-24 – 2022-11-28 (×18): 0.5 mg via INTRAVENOUS
  Filled 2022-11-23 (×20): qty 0.5

## 2022-11-23 MED ORDER — POTASSIUM CHLORIDE CRYS ER 20 MEQ PO TBCR
20.0000 meq | EXTENDED_RELEASE_TABLET | Freq: Once | ORAL | Status: AC
  Administered 2022-11-23: 20 meq via ORAL
  Filled 2022-11-23: qty 1

## 2022-11-23 MED ORDER — INSULIN GLARGINE-YFGN 100 UNIT/ML ~~LOC~~ SOLN
6.0000 [IU] | SUBCUTANEOUS | Status: DC
  Administered 2022-11-23 – 2022-11-27 (×5): 6 [IU] via SUBCUTANEOUS
  Filled 2022-11-23 (×6): qty 0.06

## 2022-11-23 MED ORDER — LIDOCAINE HCL (PF) 1 % IJ SOLN
INTRAMUSCULAR | Status: AC
  Filled 2022-11-23: qty 30

## 2022-11-23 MED ORDER — MIDAZOLAM HCL 2 MG/2ML IJ SOLN
INTRAMUSCULAR | Status: DC | PRN
  Administered 2022-11-23: 2 mg via INTRAVENOUS

## 2022-11-23 MED ORDER — OXYCODONE HCL 5 MG/5ML PO SOLN: 5.0000 mg | Freq: Once | ORAL | Status: DC | PRN

## 2022-11-23 MED ORDER — FENTANYL CITRATE (PF) 100 MCG/2ML IJ SOLN
INTRAMUSCULAR | Status: DC | PRN
  Administered 2022-11-23: 50 ug via INTRAVENOUS
  Administered 2022-11-23 (×2): 25 ug via INTRAVENOUS

## 2022-11-23 MED ORDER — VANCOMYCIN HCL 1250 MG/250ML IV SOLN
1250.0000 mg | Freq: Once | INTRAVENOUS | Status: AC
  Administered 2022-11-23: 1250 mg via INTRAVENOUS
  Filled 2022-11-23: qty 250

## 2022-11-23 SURGICAL SUPPLY — 47 items
BLADE MED AGGRESSIVE (BLADE) ×1 IMPLANT
BLADE OSC/SAGITTAL MD 5.5X18 (BLADE) IMPLANT
BLADE SURG 15 STRL LF DISP TIS (BLADE) IMPLANT
BLADE SURG 15 STRL SS (BLADE)
BLADE SURG MINI STRL (BLADE) IMPLANT
BNDG ELASTIC 4X5.8 VLCR STR LF (GAUZE/BANDAGES/DRESSINGS) ×1 IMPLANT
BNDG ESMARK 4X12 TAN STRL LF (GAUZE/BANDAGES/DRESSINGS) ×1 IMPLANT
BNDG GAUZE DERMACEA FLUFF 4 (GAUZE/BANDAGES/DRESSINGS) ×1 IMPLANT
CNTNR SPEC 2.5X3XGRAD LEK (MISCELLANEOUS) ×1
CONT SPEC 4OZ STRL OR WHT (MISCELLANEOUS) ×1
CONTAINER SPEC 2.5X3XGRAD LEK (MISCELLANEOUS) ×1 IMPLANT
CUFF TOURN SGL QUICK 12 (TOURNIQUET CUFF) IMPLANT
CUFF TOURN SGL QUICK 18X4 (TOURNIQUET CUFF) IMPLANT
DRAPE FLUOR MINI C-ARM 54X84 (DRAPES) IMPLANT
DURAPREP 26ML APPLICATOR (WOUND CARE) ×1 IMPLANT
ELECT REM PT RETURN 9FT ADLT (ELECTROSURGICAL) ×1
ELECTRODE REM PT RTRN 9FT ADLT (ELECTROSURGICAL) ×1 IMPLANT
GAUZE SPONGE 4X4 12PLY STRL (GAUZE/BANDAGES/DRESSINGS) ×2 IMPLANT
GAUZE STRETCH 2X75IN STRL (MISCELLANEOUS) IMPLANT
GAUZE XEROFORM 1X8 LF (GAUZE/BANDAGES/DRESSINGS) ×1 IMPLANT
GLOVE BIO SURGEON STRL SZ7 (GLOVE) ×1 IMPLANT
GLOVE SURG UNDER LTX SZ7 (GLOVE) ×1 IMPLANT
GOWN STRL REUS W/ TWL LRG LVL3 (GOWN DISPOSABLE) ×2 IMPLANT
GOWN STRL REUS W/TWL LRG LVL3 (GOWN DISPOSABLE) ×2
HANDPIECE VERSAJET DEBRIDEMENT (MISCELLANEOUS) IMPLANT
IV NS IRRIG 3000ML ARTHROMATIC (IV SOLUTION) IMPLANT
KIT STIMULAN RAPID CURE 5CC (Orthopedic Implant) IMPLANT
KIT TURNOVER KIT A (KITS) ×1 IMPLANT
LABEL OR SOLS (LABEL) ×1 IMPLANT
MANIFOLD NEPTUNE II (INSTRUMENTS) ×1 IMPLANT
NDL FILTER BLUNT 18X1 1/2 (NEEDLE) ×1 IMPLANT
NDL HYPO 25X1 1.5 SAFETY (NEEDLE) ×2 IMPLANT
NEEDLE FILTER BLUNT 18X1 1/2 (NEEDLE) ×1 IMPLANT
NEEDLE HYPO 25X1 1.5 SAFETY (NEEDLE) ×2 IMPLANT
NS IRRIG 500ML POUR BTL (IV SOLUTION) ×1 IMPLANT
PACK EXTREMITY ARMC (MISCELLANEOUS) ×1 IMPLANT
SOL PREP PVP 2OZ (MISCELLANEOUS) ×1
SOLUTION PREP PVP 2OZ (MISCELLANEOUS) ×1 IMPLANT
STOCKINETTE STRL 6IN 960660 (GAUZE/BANDAGES/DRESSINGS) ×1 IMPLANT
SUT ETHILON 3-0 FS-10 30 BLK (SUTURE) ×2
SUT VIC AB 3-0 SH 27 (SUTURE)
SUT VIC AB 3-0 SH 27X BRD (SUTURE) ×1 IMPLANT
SUTURE EHLN 3-0 FS-10 30 BLK (SUTURE) ×2 IMPLANT
SWAB CULTURE AMIES ANAERIB BLU (MISCELLANEOUS) IMPLANT
SYR 10ML LL (SYRINGE) ×1 IMPLANT
TRAP FLUID SMOKE EVACUATOR (MISCELLANEOUS) ×1 IMPLANT
WATER STERILE IRR 500ML POUR (IV SOLUTION) ×1 IMPLANT

## 2022-11-23 NOTE — Progress Notes (Signed)
Progress Note   Patient: Emma Thomas:[id number] DOB: 07-23-1996 DOA: 11/20/2022     3 DOS: the patient was seen and examined on 11/23/2022   Brief hospital course: 27 year old female with history of type 1 diabetes, end-stage renal disease on peritoneal dialysis, hypertension and anemia presents with left first toe being swollen and found to have osteomyelitis. 1/7.  Surgery canceled today secondary to hypokalemia.  Replacing potassium orally.  Patient also stated that her peritoneal dialysis has not been working well even at home. 1/8.  Dr. Cleda Mccreedy podiatry did a amputation of the left hallux with partial first ray resection and incision and drainage of abscess left forefoot.  Assessment and Plan: * Acute osteomyelitis ([place]) Osteomyelitis left first toe.  Podiatry did amputation of the left first hallux and partial ray amputation with incision and drainage of the abscess.  Pharmacy to dose vancomycin.  On IV Flagyl and ceftriaxone.  Hypokalemia Holding water pills.  Replace potassium aggressively yesterday with 40 mill equivalents 3 times and 2 potassium runs.  Today's potassium acceptable for operating room.  PD catheter dysfunction ([facility]) Dr. Hampton Abbot to do a revision of PD catheter tomorrow in the operating room.  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  Since patient will be n.p.o. after midnight will cut back on Semglee insulin to 6 units tonight and use short acting insulin prior to meals.  ESRD on dialysis [facility]) I consult Dr. Hampton Abbot and he will do a revision of her peritoneal catheter tomorrow in the operating room.  Hyponatremia Sodium normal range.  Anasarca Swelling bilateral lower extremities likely secondary to low albumin and end-stage renal disease.  Anemia of chronic disease Last hemoglobin 7.9.        Subjective: Patient seen this morning.  Prior to toe amputation.  Finally able to replace  potassium.  Peritoneal dialysis catheter not functioning properly.  Physical Exam: Vitals:   11/23/22 1325 11/23/22 1330 11/23/22 1334 11/23/22 1345  BP: 126/79   125/84  Pulse: 98  99 96  Resp: (!) 21 17 18 18   Temp: 99.5 F (37.5 C)     TempSrc:      SpO2: 100%  99% 98%  Weight:      Height:       Physical Exam HENT:     Head: Normocephalic.     Mouth/Throat:     Pharynx: No oropharyngeal exudate.  Eyes:     General: Lids are normal.     Conjunctiva/sclera: Conjunctivae normal.  Cardiovascular:     Rate and Rhythm: Normal rate and regular rhythm.     Heart sounds: Normal heart sounds, S1 normal and S2 normal.  Pulmonary:     Breath sounds: No decreased breath sounds, wheezing, rhonchi or rales.  Abdominal:     Palpations: Abdomen is soft.     Tenderness: There is no abdominal tenderness.  Musculoskeletal:     Right lower leg: Swelling present.     Left lower leg: Swelling present.  Skin:    General: Skin is warm.     Comments: Left first toe swollen.  Neurological:     Mental Status: She is alert and oriented to person, place, and time.     Data Reviewed: Creatinine 7.12, potassium 3.5  Disposition: Status is: Inpatient Remains inpatient appropriate because: Fourth toe amputation today and peritoneal catheter revision tomorrow  Planned Discharge Destination: Home    Time spent: 28 minutes Case discussed with general  surgery and nephrology  Author: Loletha Grayer, MD 11/23/2022 1:57 PM  For on call review [email or web address].

## 2022-11-23 NOTE — Progress Notes (Signed)
[facility]  PROGRESS NOTE   Subjective:   Resting quietly, alert and oriented Currently n.p.o. States she has had issues with her peritoneal dialysis catheter since Christmas.  PD nurse has evaluated and fibrin found and treated in outpatient clinic.  Patient states she continues to have alarms at night on her cycler, low volume.  Objective:  Vital signs: Blood pressure 117/83, pulse 98, temperature 98.5 F (36.9 C), temperature source Oral, resp. rate 16, height 5\' 6"  (1.676 m), weight 78.5 kg, last menstrual period 11/11/2022, SpO2 99 %.  Intake/Output Summary (Last 24 hours) at 11/23/2022 1608 Last data filed at 11/23/2022 1316 Gross per 24 hour  Intake 200 ml  Output 5 ml  Net 195 ml    Filed Weights   11/20/22 1101 11/23/22 0429  Weight: 69.9 kg 78.5 kg     Physical Exam: General:  No acute distress  Head:  Normocephalic, atraumatic. Moist oral mucosal membranes  Eyes:  Anicteric  Lungs:   Clear to auscultation, normal effort  Heart:  S1S2 no rubs  Abdomen:   Soft, nontender, bowel sounds present  Extremities: Trace peripheral edema.  Neurologic:  Awake, alert, following commands  Skin:   left great toe wound  Access: PD catheter    Basic Metabolic Panel: Recent Labs  Lab 11/20/22 1949 11/21/22 0516 11/22/22 0406 11/22/22 1939 11/23/22 0330  NA 133* 132* 133* 134* 135  K 2.9* 3.0* 2.8* 3.0* 3.5  CL 96* 99 99 100 102  CO2 22 21* 21* 22 21*  GLUCOSE 152* 140* 206* 106* 112*  BUN 40* 41* 42* 43* 42*  CREATININE 6.29* 6.45* 6.69* 7.33* 7.12*  CALCIUM 7.4* 7.2* 7.7* 7.7* 7.7*  MG 1.8  --  2.0  --   --   PHOS  --   --  4.7*  --   --     GFR: Estimated Creatinine Clearance: 12.4 mL/min (A) (by C-G formula based on SCr of 7.12 mg/dL (H)).  Liver Function Tests: Recent Labs  Lab 11/20/22 1107 11/21/22 0516  AST 74* 53*  ALT 63* 50*  ALKPHOS 79 67  BILITOT 0.9 0.8  PROT 7.8 6.4*  ALBUMIN 2.2* 1.8*    No results for input(s): "LIPASE",  "AMYLASE" in the last 168 hours. No results for input(s): "AMMONIA" in the last 168 hours.  CBC: Recent Labs  Lab 11/20/22 1107 11/21/22 0516 11/22/22 0406  WBC 14.7* 14.0*  --   NEUTROABS 11.0* 11.2*  --   HGB 8.6* 7.4* 7.9*  HCT 27.6* 22.7*  --   MCV 85.4 84.7  --   PLT 490* 411*  --       HbA1C: Hgb A1c MFr Bld  Date/Time Value Ref Range Status  11/20/2022 07:49 PM 7.9 (H) 4.8 - 5.6 % Final    Comment:    (NOTE)         Prediabetes: 5.7 - 6.4         Diabetes: >6.4         Glycemic control for adults with diabetes: <7.0   04/19/2022 05:05 PM 9.4 (H) 4.8 - 5.6 % Final    Comment:    (NOTE) Pre diabetes:          5.7%-6.4%  Diabetes:              >6.4%  Glycemic control for   <7.0% adults with diabetes     Urinalysis: No results for input(s): "COLORURINE", "LABSPEC", "PHURINE", "GLUCOSEU", "HGBUR", "BILIRUBINUR", "KETONESUR", "PROTEINUR", "UROBILINOGEN", "NITRITE", "  LEUKOCYTESUR" in the last 72 hours.  Invalid input(s): "APPERANCEUR"    Imaging: DG Abd 2 Views  Result Date: 11/23/2022 CLINICAL DATA:  Peritoneal dialysis catheter dysfunction EXAM: ABDOMEN - 2 VIEW COMPARISON:  CT done on 04/19/2022 FINDINGS: Bowel gas pattern is nonspecific. Peritoneal dialysis catheter is noted entering the right abdominal wall with its tip overlying the sacrum at the level of pelvic inlet. In the upright view, right hemidiaphragm is not included limiting evaluation for pneumoperitoneum. No abnormal masses or calcifications are seen. Kidneys are partly obscured by bowel contents. There is edema in subcutaneous plane. IMPRESSION: Nonspecific bowel gas pattern. Tip of peritoneal dialysis catheter is noted in lower abdomen at the level of pelvic inlet in midline. There is no definite identifiable focal kink in the peritoneal dialysis catheter. There is edema in subcutaneous plane suggesting possible anasarca. Electronically Signed   By: Elmer Picker M.D.   On: 11/23/2022 10:51      Medications:    cefTRIAXone (ROCEPHIN)  IV 2 g (11/23/22 1603)   dialysis solution 1.5% low-MG/low-CA     metronidazole 500 mg (11/23/22 1445)   vancomycin      amLODipine  10 mg Oral Daily   gentamicin cream  1 Application Topical Daily   heparin  5,000 Units Subcutaneous Q8H   insulin aspart  0-5 Units Subcutaneous QHS   insulin aspart  0-9 Units Subcutaneous TID WC   insulin aspart  3 Units Subcutaneous TID WC   insulin glargine-yfgn  6 Units Subcutaneous Q24H   losartan  100 mg Oral Daily   montelukast  10 mg Oral QHS   multivitamin with minerals  1 tablet Oral Daily   vancomycin variable dose per unstable renal function (pharmacist dosing)   Does not apply See admin instructions    Assessment/ Plan:     27 year old female with history of type 1 diabetes, end-stage renal disease on peritoneal dialysis, hypertension now admitted with history of left toe ulceration.  She had an MRI showing osteomyelitis.  She was found to have hypokalemia and had multiple potassium supplements.   ESRD on peritoneal dialysis: Patient has PD catheter malfunction.  Surgery aware of patient and will follow-up.  Will continue to monitor labs as patient has not been able to receive treatment during this admission.   ANEMIA with chronic kidney disease: Hemoglobin below target.  Will continue to monitor.   Secondary Hyperparathyroidism: with outpatient labs: PTH 291, phosphorus 4.4, calcium 7.7 on 11/19/22.   Lab Results  Component Value Date   CALCIUM 7.7 (L) 11/23/2022   CAION 0.89 (LL) 07/16/2022   PHOS 4.7 (H) 11/22/2022  Calcium slightly decreased, phosphorus at goal. Will continue to monitor bone minerals.    HTN/VOL with chronic kidney disease: Continue losartan and amlodipine.   ACCESS: PD catheter has not been working well.  Surgery plans evaluation and revision tomorrow.    Hypokalemia: Corrected with supplementation.     Acute osteomyelitis: Continue antibiotics with Rocephin and  metronidazole. Surgery will perform amputation today.   Diabetes mellitus, type 2 with chronic kidney disease: Continue insulin doses as ordered.   LOS: [facility] 1/8/20244:08 PM

## 2022-11-23 NOTE — Transfer of Care (Signed)
Immediate Anesthesia Transfer of Care Note  Patient: Emma Thomas  Procedure(s) Performed: LEFT GREAT TOE PARTIAL RAY AMPUTATION (Left)  Patient Location: PACU  Anesthesia Type:General  Level of Consciousness: awake, alert , and oriented  Airway & Oxygen Therapy: Patient Spontanous Breathing and Patient connected to face mask oxygen  Post-op Assessment: Report given to RN and Post -op Vital signs reviewed and stable  Post vital signs: Reviewed and stable  Last Vitals:  Vitals Value Taken Time  BP 126/79   Temp    Pulse 99   Resp 20   SpO2 100     Last Pain:  Vitals:   11/23/22 1131  TempSrc: Oral  PainSc:       Patients Stated Pain Goal: 0 (49/49/44 7395)  Complications: No notable events documented.

## 2022-11-23 NOTE — H&P (View-Only) (Signed)
Patient ID: Emma Thomas, female   DOB: 1996-07-27, 27 y.o.   MRN: [id number] Patient seen this morning.  Feeling good.  Was scheduled for surgery today for amputation left hallux with partial first ray resection.  Unfortunately Dr. Luana Shu is out with an acute illness.  Discussed with the patient proceeding with the scheduled surgery but that I would be performing this.  Discussed possible risks and complications of the procedure including but not limited to inability of the foot to heal due to her poorly controlled diabetes or extent of infection.  No guarantees could be given as to the outcome.  We will reobtain consent for same procedure just under my direction.  Plan for surgery around noon today.

## 2022-11-23 NOTE — Discharge Planning (Signed)
ESTABLISHED PERITONEAL DIALYSIS Outpatient Facility [facility] [address] [place], [place] [address] [phone] 

## 2022-11-23 NOTE — Progress Notes (Signed)
Pharmacy Antibiotic Note  Emma Thomas is a 27 y.o. female admitted on 11/20/2022 with acute osteomyelitis of left first toe. Patient has PMH of DM1 with ESRD, on peritoneal dialysis.  Pharmacy has been consulted for vancomycin dosing.  Patient received vancomycin 1250mg  IV on 11/20/22 1913 and a level was drawn 3 days later  vancomycin random level: 11 mcg/mL 11/23/22 0330  Plan: repeat vancomycin 1250mg  IV x 1 Repeat vancomycin level 11/26/22 am Re-dose vancomycin once trough <20 mg/L  Height: 5\' 6"  (167.6 cm) Weight: 78.5 kg (173 lb) IBW/kg (Calculated) : 59.3  Temp (24hrs), Avg:98.5 F (36.9 C), Min:98.3 F (36.8 C), Max:98.7 F (37.1 C)  Recent Labs  Lab 11/20/22 1107 11/20/22 1638 11/20/22 1949 11/21/22 0516 11/22/22 0406 11/22/22 1939 11/23/22 0330  WBC 14.7*  --   --  14.0*  --   --   --   CREATININE 5.91*  --  6.29* 6.45* 6.69* 7.33* 7.12*  LATICACIDVEN  --  0.8  --   --   --   --   --   VANCORANDOM  --   --   --   --   --   --  11     Estimated Creatinine Clearance: 12.4 mL/min (A) (by C-G formula based on SCr of 7.12 mg/dL (H)).    No Known Allergies  Antimicrobials this admission: 1/5 vancomycin >> 1/5 metronidazole >> 1/5 ceftriaxone>>   Microbiology results: 1/5  BCx: NGTD 1/8 WCx: pending 1/8 MRSA PCR: negative  Thank you for allowing pharmacy to be a part of this patient's care.  Dallie Piles, PharmD, BCPS Clinical Pharmacist 11/23/2022 7:04 AM

## 2022-11-23 NOTE — Progress Notes (Signed)
Urine pregnancy negative  

## 2022-11-23 NOTE — Progress Notes (Signed)
It was noted that the patient was putting weight on her left foot. Dr Cleda Mccreedy notified. Order placed for PT. Patient was informed that she is only to get up for the bathroom and she is not to bear any weight at all on her left foot

## 2022-11-23 NOTE — Interval H&P Note (Signed)
History and Physical Interval Note:  11/23/2022 11:59 AM  Emma Thomas  has presented today for surgery, with the diagnosis of INFECTION IN BONE.  The various methods of treatment have been discussed with the patient and family. After consideration of risks, benefits and other options for treatment, the patient has consented to  Procedure(s): LEFT GREAT TOE PARTIAL RAY AMPUTATION (Left) as a surgical intervention.  The patient's history has been reviewed, patient examined, no change in status, stable for surgery.  I have reviewed the patient's chart and labs.  Questions were answered to the patient's satisfaction.     Durward Fortes

## 2022-11-23 NOTE — Consult Note (Signed)
[facility] SURGICAL CONSULTATION NOTE (initial) - cpt: 15400   HISTORY OF PRESENT ILLNESS (HPI):  27 y.o. female admitted to [facility] secondary to acute osteomyelitis. Plan for left first toe amputation today (1/08) with podiatry. Patient known to our service secondary to PD catheter placement on 07/25 and subsequent revision on 08/31, both with Dr Hampton Abbot. She reports that she has been getting filling and draining errors at home. PD nurse was able to manually drain the catheter very slowly but there was fibrin in this. Unfortunately, she continues to get draining errors at home. No fever, chills, nausea, emesis, or abdominal pain. Most recent labs are reassuring.   Surgery is consulted by hospitalist physician Dr. Loletha Grayer, MD in this context for evaluation and management of malfunctioning PD catheter.  PAST MEDICAL HISTORY (PMH):  Past Medical History:  Diagnosis Date   Anemia    Diabetes mellitus without complication ([place])    Type 1 DM   Essential hypertension    Headache    Hypertension 03/04/2013   Neurologic disorder    Both feet   Recurrent UTI    Renal disorder      PAST SURGICAL HISTORY ([place]):  Past Surgical History:  Procedure Laterality Date   abscess removal     excision of bartholin cyst   CAPD INSERTION N/A 06/09/2022   Procedure: LAPAROSCOPIC INSERTION CONTINUOUS AMBULATORY PERITONEAL DIALYSIS  (CAPD) CATHETER, PD rep to be present;  Surgeon: Olean Ree, MD;  Location: [facility] ORS;  Service: General;  Laterality: N/A;   CAPD REVISION N/A 07/16/2022   Procedure: LAPAROSCOPIC REVISION CONTINUOUS AMBULATORY PERITONEAL DIALYSIS  (CAPD) CATHETER;  Surgeon: Olean Ree, MD;  Location: [facility] ORS;  Service: General;  Laterality: N/A;   DIALYSIS/PERMA CATHETER INSERTION N/A 04/24/2022   Procedure: DIALYSIS/PERMA CATHETER INSERTION;  Surgeon: Katha Cabal, MD;  Location: [facility];  Service: Cardiovascular;  Laterality: N/A;    DIALYSIS/PERMA CATHETER REMOVAL N/A 06/02/2022   Procedure: DIALYSIS/PERMA CATHETER REMOVAL;  Surgeon: Katha Cabal, MD;  Location: [facility];  Service: Cardiovascular;  Laterality: N/A;   INSERTION OF DIALYSIS CATHETER Right    ARM   Nexplanon  [id number]/7619   UMBILICAL HERNIA REPAIR N/A 06/09/2022   Procedure: HERNIA REPAIR UMBILICAL ADULT;  Surgeon: Olean Ree, MD;  Location: [facility] ORS;  Service: General;  Laterality: N/A;     MEDICATIONS:  Prior to Admission medications   Medication Sig Start Date End Date Taking? Authorizing Provider  amLODipine (NORVASC) 10 MG tablet Take 1 tablet (10 mg total) by mouth daily. 04/28/22  Yes Jennye Boroughs, MD  atorvastatin (LIPITOR) 40 MG tablet Take 80 mg by mouth daily. 01/30/22  Yes [provider]  bumetanide (BUMEX) 1 MG tablet Take 1 mg by mouth 2 (two) times daily. 07/13/22  Yes [provider]  ferrous gluconate (FERGON) 324 MG tablet Take 324 mg by mouth daily with breakfast. 05/03/20  Yes [provider]  furosemide (LASIX) 80 MG tablet Take 80 mg by mouth daily. 08/10/22  Yes [provider]  insulin lispro (HUMALOG) 100 UNIT/ML injection Inject 5-10 Units into the skin 3 (three) times daily before meals. Plus sliding scale 1 unit per 50 > 150   Yes [provider]  losartan (COZAAR) 100 MG tablet Take 100 mg by mouth daily. 06/04/22  Yes [provider]  montelukast (SINGULAIR) 10 MG tablet Take 10 mg by mouth daily. 04/19/22  Yes [provider]  multivitamin (RENA-VIT) TABS tablet Take 1 tablet  by mouth daily. 10/30/22  Yes [provider]  acetaminophen (TYLENOL) 500 MG tablet Take 2 tablets (1,000 mg total) by mouth every 6 (six) hours as needed for mild pain. 07/16/22   Olean Ree, MD  JUNEL 1/20 1-20 MG-MCG tablet Take 1 tablet by mouth daily. 04/17/22   [provider]  oxyCODONE (OXY IR/ROXICODONE) 5 MG immediate release tablet Take 1 tablet (5 mg  total) by mouth every 4 (four) hours as needed for severe pain. Patient not taking: Reported on 08/10/2022 07/16/22   Olean Ree, MD  predniSONE (DELTASONE) 20 MG tablet Take 1 tablet (20 mg total) by mouth daily with breakfast. Patient not taking: Reported on 11/20/2022 08/10/22   Nena Polio, MD  SUMAtriptan (IMITREX) 50 MG tablet Take 50 mg by mouth daily as needed for migraine. 11/11/21   [provider]  Vitamin D, Ergocalciferol, (DRISDOL) 1.25 MG (50000 UNIT) CAPS capsule Take 50,000 Units by mouth once a week. 02/05/22   [provider]     ALLERGIES:  No Known Allergies   SOCIAL HISTORY:  Social History   Socioeconomic History   Marital status: Married    Spouse name: Cameo   Number of children: 1   Years of education: Not on file   Highest education level: Not on file  Occupational History   Not on file  Tobacco Use   Smoking status: Never    Passive exposure: Past   Smokeless tobacco: Never  Vaping Use   Vaping Use: Never used  Substance and Sexual Activity   Alcohol use: Not Currently    Alcohol/week: 2.0 standard drinks of alcohol    Types: 2 Shots of liquor per week   Drug use: No   Sexual activity: Yes    Birth control/protection: None  Other Topics Concern   Not on file  Social History Narrative   Not on file   Social Determinants of Health   Financial Resource Strain: Not on file  Food Insecurity: No Food Insecurity (11/21/2022)   Hunger Vital Sign    Worried About Running Out of Food in the Last Year: Never true    Ran Out of Food in the Last Year: Never true  Transportation Needs: No Transportation Needs (11/21/2022)   PRAPARE - Hydrologist (Medical): No    Lack of Transportation (Non-Medical): No  Physical Activity: Not on file  Stress: Not on file  Social Connections: Not on file  Intimate Partner Violence: Not At Risk (11/21/2022)   Humiliation, Afraid, Rape, and Kick questionnaire    Fear of  Current or Ex-Partner: No    Emotionally Abused: No    Physically Abused: No    Sexually Abused: No     FAMILY HISTORY:  Family History  Problem Relation Age of Onset   Breast cancer Mother 2   Lung cancer Maternal Grandmother    Diabetes type II Paternal Grandmother       REVIEW OF SYSTEMS:  Review of Systems  Constitutional:  Negative for chills and fever.  Respiratory:  Negative for cough and shortness of breath.   Cardiovascular:  Negative for chest pain and palpitations.  Gastrointestinal:  Negative for abdominal pain, nausea and vomiting.  All other systems reviewed and are negative.   VITAL SIGNS:  Temp:  [98.2 F (36.8 C)-98.7 F (37.1 C)] 98.2 F (36.8 C) (01/08 0741) Pulse Rate:  [93-101] 101 (01/08 0741) Resp:  [17-20] 17 (01/08 0741) BP: (110-122)/(68-81) 120/80 (01/08 0741)  SpO2:  [99 %-100 %] 100 % (01/08 0741) Weight:  [78.5 kg] 78.5 kg (01/08 0429)     Height: 5\' 6"  (167.6 cm) Weight: 78.5 kg BMI (Calculated): 27.94   INTAKE/OUTPUT:  01/07 0701 - 01/08 0700 In: 120 [P.O.:120] Out: 0   PHYSICAL EXAM:  Physical Exam Vitals and nursing note reviewed. Exam conducted with a chaperone present.  Constitutional:      General: She is not in acute distress.    Appearance: Normal appearance. She is normal weight. She is not ill-appearing.  HENT:     Head: Normocephalic and atraumatic.  Eyes:     General: No scleral icterus.    Conjunctiva/sclera: Conjunctivae normal.  Pulmonary:     Effort: Pulmonary effort is normal. No respiratory distress.  Abdominal:     General: Abdomen is flat. A surgical scar is present. There is no distension.     Palpations: Abdomen is soft.     Tenderness: There is no abdominal tenderness.     Comments: Abdomen is soft, non-tender, non-distended, no rebound/guarding. PD catheter in right abdomen, site CDI  Genitourinary:    Comments: Deferred Skin:    General: Skin is warm and dry.  Neurological:     General: No focal  deficit present.     Mental Status: She is alert and oriented to person, place, and time. Mental status is at baseline.  Psychiatric:        Mood and Affect: Mood normal.        Behavior: Behavior normal.      Labs:     Latest Ref Rng & Units 11/22/2022    4:06 AM 11/21/2022    5:16 AM 11/20/2022   11:07 AM  CBC  WBC 4.0 - 10.5 K/uL  14.0  14.7   Hemoglobin 12.0 - 15.0 g/dL 7.9  7.4  8.6   Hematocrit 36.0 - 46.0 %  22.7  27.6   Platelets 150 - 400 K/uL  411  490       Latest Ref Rng & Units 11/23/2022    3:30 AM 11/22/2022    7:39 PM 11/22/2022    4:06 AM  CMP  Glucose 70 - 99 mg/dL 112  106  206   BUN 6 - 20 mg/dL 42  43  42   Creatinine 0.44 - 1.00 mg/dL 7.12  7.33  6.69   Sodium 135 - 145 mmol/L 135  134  133   Potassium 3.5 - 5.1 mmol/L 3.5  3.0  2.8   Chloride 98 - 111 mmol/L 102  100  99   CO2 22 - 32 mmol/L 21  22  21    Calcium 8.9 - 10.3 mg/dL 7.7  7.7  7.7     Imaging studies:  None   Assessment/Plan: 27 y.o. female with malfunction of her PD catheter    - Unfortunately, Dr Hampton Abbot is not available today for revision during her planned podiatry procedure. AS such, we will plan for PD catheter revision in the OR tomorrow (01/09) pending OR/Anesthesia availability.  - All risks, benefits, and alternatives to above procedure(s) were discussed with the patient, all of her questions were answered to her expressed satisfaction, patient expresses she wishes to proceed, and informed consent was obtained.    - NPO At midnight   - IV Abx on call to OR   All of the above findings and recommendations were discussed with the patient, and all of patient's questions were answered to her expressed satisfaction.  Thank  you for the opportunity to participate in this patient's care.   -- Edison Simon, PA-C [facility] 11/23/2022, 9:51 AM M-F: 7am - 4pm

## 2022-11-23 NOTE — Anesthesia Postprocedure Evaluation (Signed)
Anesthesia Post Note  Patient: Emma Thomas  Procedure(s) Performed: LEFT GREAT TOE PARTIAL RAY AMPUTATION (Left)  Patient location during evaluation: PACU Anesthesia Type: General Level of consciousness: awake and alert Pain management: pain level controlled Vital Signs Assessment: post-procedure vital signs reviewed and stable Respiratory status: spontaneous breathing, nonlabored ventilation, respiratory function stable and patient connected to nasal cannula oxygen Cardiovascular status: blood pressure returned to baseline and stable Postop Assessment: no apparent nausea or vomiting Anesthetic complications: no   No notable events documented.   Last Vitals:  Vitals:   11/23/22 1334 11/23/22 1345  BP:  125/84  Pulse: 99 96  Resp: 18 18  Temp:    SpO2: 99% 98%    Last Pain:  Vitals:   11/23/22 1345  TempSrc:   PainSc: 0-No pain                 Arita Miss

## 2022-11-23 NOTE — Progress Notes (Signed)
Patient continues to have nausea after receiving zofran. Dr Leslye Peer ordered a once of phenergan

## 2022-11-23 NOTE — Anesthesia Preprocedure Evaluation (Signed)
Anesthesia Evaluation  Patient identified by MRN, date of birth, ID band Patient awake    Reviewed: Allergy & Precautions, NPO status , Patient's Chart, lab work & pertinent test results  History of Anesthesia Complications Negative for: history of anesthetic complications  Airway Mallampati: II  TM Distance: >3 FB Neck ROM: Full    Dental  (+) Edentulous Upper, Poor Dentition   Pulmonary neg pulmonary ROS, neg sleep apnea, neg COPD, Patient abstained from smoking.Not current smoker   Pulmonary exam normal breath sounds clear to auscultation       Cardiovascular Exercise Tolerance: Good METShypertension, (-) CAD and (-) Past MI (-) dysrhythmias  Rhythm:Regular Rate:Normal - Systolic murmurs    Neuro/Psych  Headaches PSYCHIATRIC DISORDERS  Depression       GI/Hepatic ,neg GERD  ,,(+)     (-) substance abuse    Endo/Other  diabetes, Type 1    Renal/GU ESRF and DialysisRenal diseasePartial dialysis yesterday. Feels well today from fluid status standpoint     Musculoskeletal   Abdominal   Peds  Hematology   Anesthesia Other Findings Past Medical History: No date: Anemia No date: Diabetes mellitus without complication (HCC)     Comment:  Type 1 DM No date: Essential hypertension No date: Headache 03/04/2013: Hypertension No date: Neurologic disorder     Comment:  Both feet No date: Recurrent UTI No date: Renal disorder  Reproductive/Obstetrics                              Anesthesia Physical Anesthesia Plan  ASA: 4  Anesthesia Plan: General   Post-op Pain Management: Minimal or no pain anticipated   Induction: Intravenous  PONV Risk Score and Plan: 3 and Propofol infusion, TIVA, Ondansetron and Midazolam  Airway Management Planned: Nasal Cannula  Additional Equipment: None  Intra-op Plan:   Post-operative Plan:   Informed Consent: I have reviewed the patients History  and Physical, chart, labs and discussed the procedure including the risks, benefits and alternatives for the proposed anesthesia with the patient or authorized representative who has indicated his/her understanding and acceptance.     Dental advisory given  Plan Discussed with: CRNA and Surgeon  Anesthesia Plan Comments: (Discussed risks of anesthesia with patient, including possibility of difficulty with spontaneous ventilation under anesthesia necessitating airway intervention, PONV, and rare risks such as cardiac or respiratory or neurological events, and allergic reactions. Discussed the role of CRNA in patient's perioperative care. Patient understands.)         Anesthesia Quick Evaluation

## 2022-11-23 NOTE — Progress Notes (Signed)
Patient ID: Emma Thomas, female   DOB: 1996/06/17, 27 y.o.   MRN: [id number] Patient seen this morning.  Feeling good.  Was scheduled for surgery today for amputation left hallux with partial first ray resection.  Unfortunately Dr. Luana Shu is out with an acute illness.  Discussed with the patient proceeding with the scheduled surgery but that I would be performing this.  Discussed possible risks and complications of the procedure including but not limited to inability of the foot to heal due to her poorly controlled diabetes or extent of infection.  No guarantees could be given as to the outcome.  We will reobtain consent for same procedure just under my direction.  Plan for surgery around noon today.

## 2022-11-23 NOTE — Anesthesia Procedure Notes (Signed)
Procedure Name: LMA Insertion Date/Time: 11/23/2022 12:15 PM  Performed by: Lily Peer, Leliana Kontz, CRNAPre-anesthesia Checklist: Patient identified, Emergency Drugs available, Suction available and Patient being monitored Patient Re-evaluated:Patient Re-evaluated prior to induction Oxygen Delivery Method: Circle system utilized Preoxygenation: Pre-oxygenation with 100% oxygen Induction Type: IV induction Ventilation: Mask ventilation without difficulty LMA: LMA inserted LMA Size: 3.0 Number of attempts: 1 Placement Confirmation: positive ETCO2 and breath sounds checked- equal and bilateral Tube secured with: Tape Dental Injury: Teeth and Oropharynx as per pre-operative assessment

## 2022-11-23 NOTE — Progress Notes (Signed)
Patient transported to the OR 

## 2022-11-23 NOTE — Assessment & Plan Note (Addendum)
Dr. Aleen Campi did a peritoneal catheter revision with fibrin clot removal.  He recommended emptying bladder prior to doing treatments.

## 2022-11-23 NOTE — Op Note (Signed)
Date of operation: 11/23/2022.  Surgeon: Durward Fortes D.P.M.  Preoperative diagnosis: 1.  Osteomyelitis left hallux and first metatarsal. 2.  Abscess left forefoot.  Postoperative diagnosis: Same.  Procedures: 1.  Amputation left hallux with partial first ray resection. 2.  Incision and drainage abscess left forefoot.  Anesthesia: LMA general.  Hemostasis: Pneumatic tourniquet left ankle 250 mmHg.  Estimated blood loss: Less than 5 cc.  Cultures: 1.  Bone culture distal phalanx left hallux. 2.  Abscess deep tissue left forefoot.  Pathology: 1.  Left first ray. 2.  Portion of first metatarsal, proximal border inked.  Implants: Stimulan rapid cure antibiotic beads impregnated with vancomycin.  Injectables: 10 cc 0.5% Marcaine plain.  Complications: None apparent.  Operative indications: This is a 27 year old diabetic female with recent development of redness and swelling in the left great toe.  Noted to have extensive osteomyelitis in the great toe and was admitted for IV antibiotics.  MRI also revealed possible involvement of the first metatarsal and septic joint.  Decision made for first ray resection.  Operative procedure: Patient was taken to the operating room and placed on the table in the supine position.  Following satisfactory LMA anesthesia a pneumatic tourniquet was applied at the level of the left ankle and the foot was prepped and draped in usual sterile fashion.  The foot was elevationally exsanguinated and the tourniquet inflated to 250 mmHg.  Attention was directed to the left foot.  Prior to incision there was noted to be an abscess forming along the plantar aspect of the left first metatarsal.  Incision was made in a racquet type fashion extending along the medial aspect of the first metatarsal and then dorsally and plantarly around the base of the great toe.  Incision was carried sharply down to the level of the bone.  Bleeders identified and cauterized as necessary.   Extensive infection and devitalized tissue was noted especially along the plantar incision at the base of the great toe.  There was noted to be a purulent abscess noted along the plantar medial aspect which did connect with the abscess starting to come visible along the plantar aspect of the foot and a deep abscess culture was taken.  Using a sagittal saw the first metatarsal was incised and the first ray was then removed in toto.  Sample of bone from the distal phalanx was taken for bone culture.  Specimen was then passed off of the table.  There was noted to be extensive fatty type tissue within the medullary canal of the first metatarsal with apparent destruction of the medullary bone.  This was suctioned out.  A second portion of the first metatarsal was then removed using sagittal saw and the proximal border.  An incision was made through the plantar abscess site sharply using a Beaver blade which was then connected with the abscess through the plantar aspect of the wound.  Gross debridement of the devitalized tissue was performed using a rongeur.  The entire wound was then thoroughly debrided with a Versajet debrider on settings between 3 and 5 to remove all devitalized and purulent tissue.  The wound was then flushed with 3 L of sterile saline under pulse irrigation.  Stimulan rapid cure antibiotic beads impregnated with vancomycin were then placed within the wound.  A 3-0 nylon simple erupted suture was placed through the plantar incision followed by closure of the ray resection site using 3-0 nylon vertical mattress and simple interrupted sutures.  10 cc of 0.5% Marcaine  plain was then injected around the first ray for postoperative analgesia.  Xeroform 4 x 4's ABD and Kerlix applied to the left foot.  The tourniquet was then released.  Second Kerlix and Ace wrap then applied to the left lower extremity.  Patient was awakened and transported to the PACU having tolerated the anesthesia and procedure  well.

## 2022-11-24 ENCOUNTER — Encounter: Payer: Self-pay | Admitting: Podiatry

## 2022-11-24 ENCOUNTER — Inpatient Hospital Stay: Payer: Medicaid Other | Admitting: General Practice

## 2022-11-24 ENCOUNTER — Encounter
Admission: EM | Disposition: A | Discharge status: Home Health | Payer: Self-pay | Source: Home / Self Care | Attending: Internal Medicine

## 2022-11-24 DIAGNOSIS — T85611S Breakdown (mechanical) of intraperitoneal dialysis catheter, sequela: Secondary | ICD-10-CM

## 2022-11-24 DIAGNOSIS — E1122 Type 2 diabetes mellitus with diabetic chronic kidney disease: Secondary | ICD-10-CM | POA: Diagnosis not present

## 2022-11-24 DIAGNOSIS — E876 Hypokalemia: Secondary | ICD-10-CM | POA: Diagnosis not present

## 2022-11-24 DIAGNOSIS — R112 Nausea with vomiting, unspecified: Secondary | ICD-10-CM | POA: Diagnosis not present

## 2022-11-24 DIAGNOSIS — E11628 Type 2 diabetes mellitus with other skin complications: Secondary | ICD-10-CM | POA: Diagnosis not present

## 2022-11-24 DIAGNOSIS — E1169 Type 2 diabetes mellitus with other specified complication: Secondary | ICD-10-CM | POA: Diagnosis not present

## 2022-11-24 DIAGNOSIS — T85611A Breakdown (mechanical) of intraperitoneal dialysis catheter, initial encounter: Secondary | ICD-10-CM | POA: Diagnosis not present

## 2022-11-24 DIAGNOSIS — M8610 Other acute osteomyelitis, unspecified site: Secondary | ICD-10-CM | POA: Diagnosis not present

## 2022-11-24 DIAGNOSIS — L089 Local infection of the skin and subcutaneous tissue, unspecified: Secondary | ICD-10-CM

## 2022-11-24 HISTORY — PX: CAPD REVISION: SHX5260

## 2022-11-24 LAB — CBC
HCT: 23.2 % — ABNORMAL LOW (ref 36.0–46.0)
Hemoglobin: 7.2 g/dL — ABNORMAL LOW (ref 12.0–15.0)
MCH: 26.8 pg (ref 26.0–34.0)
MCHC: 31 g/dL (ref 30.0–36.0)
MCV: 86.2 fL (ref 80.0–100.0)
Platelets: 462 10*3/uL — ABNORMAL HIGH (ref 150–400)
RBC: 2.69 MIL/uL — ABNORMAL LOW (ref 3.87–5.11)
RDW: 12 % (ref 11.5–15.5)
WBC: 8.9 10*3/uL (ref 4.0–10.5)
nRBC: 0 % (ref 0.0–0.2)

## 2022-11-24 LAB — BASIC METABOLIC PANEL
Anion gap: 10 (ref 5–15)
BUN: 37 mg/dL — ABNORMAL HIGH (ref 6–20)
CO2: 20 mmol/L — ABNORMAL LOW (ref 22–32)
Calcium: 7.7 mg/dL — ABNORMAL LOW (ref 8.9–10.3)
Chloride: 104 mmol/L (ref 98–111)
Creatinine, Ser: 6.97 mg/dL — ABNORMAL HIGH (ref 0.44–1.00)
GFR, Estimated: 8 mL/min — ABNORMAL LOW (ref >60)
Glucose, Bld: 110 mg/dL — ABNORMAL HIGH (ref 70–99)
Potassium: 3.8 mmol/L (ref 3.5–5.1)
Sodium: 134 mmol/L — ABNORMAL LOW (ref 135–145)

## 2022-11-24 LAB — GLUCOSE, CAPILLARY
Glucose-Capillary: 81 mg/dL (ref 70–99)
Glucose-Capillary: 110 mg/dL — ABNORMAL HIGH (ref 70–99)
Glucose-Capillary: 148 mg/dL — ABNORMAL HIGH (ref 70–99)
Glucose-Capillary: 103 mg/dL — ABNORMAL HIGH (ref 70–99)

## 2022-11-24 LAB — PREPARE RBC (CROSSMATCH)

## 2022-11-24 SURGERY — LAPAROSCOPIC REVISION CONTINUOUS AMBULATORY PERITONEAL DIALYSIS  (CAPD) CATHETER
Anesthesia: General | Site: Abdomen

## 2022-11-24 MED ORDER — ONDANSETRON HCL 4 MG/2ML IJ SOLN
INTRAMUSCULAR | Status: AC
  Filled 2022-11-24: qty 2

## 2022-11-24 MED ORDER — CEFAZOLIN SODIUM-DEXTROSE 2-4 GM/100ML-% IV SOLN
INTRAVENOUS | Status: AC
  Filled 2022-11-24: qty 100

## 2022-11-24 MED ORDER — SODIUM CHLORIDE 0.9 % IV SOLN: 10.0000 mL/h | Freq: Once | INTRAVENOUS | Status: AC

## 2022-11-24 MED ORDER — OXYCODONE HCL 5 MG/5ML PO SOLN: 5.0000 mg | Freq: Once | ORAL | Status: DC | PRN

## 2022-11-24 MED ORDER — DEXMEDETOMIDINE HCL IN NACL 200 MCG/50ML IV SOLN
INTRAVENOUS | Status: DC | PRN
  Administered 2022-11-24: 8 ug via INTRAVENOUS

## 2022-11-24 MED ORDER — HEPARIN SODIUM (PORCINE) 5000 UNIT/ML IJ SOLN
5000.0000 [IU] | Freq: Three times a day (TID) | INTRAMUSCULAR | Status: DC
  Administered 2022-11-25 – 2022-11-28 (×9): 5000 [IU] via SUBCUTANEOUS
  Filled 2022-11-24 (×9): qty 1

## 2022-11-24 MED ORDER — BUPIVACAINE-EPINEPHRINE (PF) 0.5% -1:200000 IJ SOLN
INTRAMUSCULAR | Status: AC
  Filled 2022-11-24: qty 30

## 2022-11-24 MED ORDER — CHLORHEXIDINE GLUCONATE 0.12 % MT SOLN
OROMUCOSAL | Status: AC
  Filled 2022-11-24: qty 15

## 2022-11-24 MED ORDER — DELFLEX-LC/1.5% DEXTROSE 344 MOSM/L IP SOLN: INTRAPERITONEAL | Status: DC

## 2022-11-24 MED ORDER — SUCCINYLCHOLINE CHLORIDE 200 MG/10ML IV SOSY
PREFILLED_SYRINGE | INTRAVENOUS | Status: DC | PRN
  Administered 2022-11-24: 100 mg via INTRAVENOUS

## 2022-11-24 MED ORDER — BUPIVACAINE-EPINEPHRINE 0.5% -1:200000 IJ SOLN
INTRAMUSCULAR | Status: DC | PRN
  Administered 2022-11-24: 30 mL

## 2022-11-24 MED ORDER — ROCURONIUM BROMIDE 10 MG/ML (PF) SYRINGE
PREFILLED_SYRINGE | INTRAVENOUS | Status: AC
  Filled 2022-11-24: qty 20

## 2022-11-24 MED ORDER — MIDAZOLAM HCL 2 MG/2ML IJ SOLN
INTRAMUSCULAR | Status: AC
  Filled 2022-11-24: qty 2

## 2022-11-24 MED ORDER — CEFAZOLIN SODIUM-DEXTROSE 2-4 GM/100ML-% IV SOLN
2.0000 g | Freq: Once | INTRAVENOUS | Status: AC
  Administered 2022-11-24: 2 g via INTRAVENOUS

## 2022-11-24 MED ORDER — DEXAMETHASONE SODIUM PHOSPHATE 10 MG/ML IJ SOLN
INTRAMUSCULAR | Status: AC
  Filled 2022-11-24: qty 1

## 2022-11-24 MED ORDER — PROPOFOL 10 MG/ML IV BOLUS
INTRAVENOUS | Status: DC | PRN
  Administered 2022-11-24: 150 mg via INTRAVENOUS

## 2022-11-24 MED ORDER — SODIUM CHLORIDE 0.9 % IV SOLN
12.5000 mg | Freq: Once | INTRAVENOUS | Status: AC
  Administered 2022-11-24: 12.5 mg via INTRAVENOUS
  Filled 2022-11-24: qty 12.5

## 2022-11-24 MED ORDER — CHLORHEXIDINE GLUCONATE 0.12 % MT SOLN
15.0000 mL | Freq: Once | OROMUCOSAL | Status: AC
  Administered 2022-11-24: 15 mL via OROMUCOSAL

## 2022-11-24 MED ORDER — LIDOCAINE HCL (CARDIAC) PF 100 MG/5ML IV SOSY
PREFILLED_SYRINGE | INTRAVENOUS | Status: DC | PRN
  Administered 2022-11-24: 100 mg via INTRAVENOUS

## 2022-11-24 MED ORDER — HEPARIN 5000 UNITS IN NS 1000 ML (FLUSH)
INTRAMUSCULAR | Status: DC | PRN
  Administered 2022-11-24: 600 mL via INTRAMUSCULAR

## 2022-11-24 MED ORDER — FENTANYL CITRATE (PF) 100 MCG/2ML IJ SOLN: 25.0000 ug | INTRAMUSCULAR | Status: DC | PRN

## 2022-11-24 MED ORDER — FENTANYL CITRATE (PF) 250 MCG/5ML IJ SOLN
INTRAMUSCULAR | Status: AC
  Filled 2022-11-24: qty 5

## 2022-11-24 MED ORDER — FENTANYL CITRATE (PF) 100 MCG/2ML IJ SOLN
INTRAMUSCULAR | Status: DC | PRN
  Administered 2022-11-24 (×5): 50 ug via INTRAVENOUS

## 2022-11-24 MED ORDER — PHENYLEPHRINE HCL (PRESSORS) 10 MG/ML IV SOLN
INTRAVENOUS | Status: DC | PRN
  Administered 2022-11-24: 80 ug via INTRAVENOUS

## 2022-11-24 MED ORDER — ONDANSETRON HCL 4 MG/2ML IJ SOLN
INTRAMUSCULAR | Status: DC | PRN
  Administered 2022-11-24: 4 mg via INTRAVENOUS

## 2022-11-24 MED ORDER — 0.9 % SODIUM CHLORIDE (POUR BTL) OPTIME
TOPICAL | Status: DC | PRN
  Administered 2022-11-24: 100 mL

## 2022-11-24 MED ORDER — PHENYLEPHRINE 80 MCG/ML (10ML) SYRINGE FOR IV PUSH (FOR BLOOD PRESSURE SUPPORT)
PREFILLED_SYRINGE | INTRAVENOUS | Status: AC
  Filled 2022-11-24: qty 30

## 2022-11-24 MED ORDER — HYDROMORPHONE HCL 1 MG/ML IJ SOLN
INTRAMUSCULAR | Status: AC
  Filled 2022-11-24: qty 1

## 2022-11-24 MED ORDER — PROPOFOL 10 MG/ML IV BOLUS
INTRAVENOUS | Status: AC
  Filled 2022-11-24: qty 20

## 2022-11-24 MED ORDER — MIDAZOLAM HCL 2 MG/2ML IJ SOLN
INTRAMUSCULAR | Status: DC | PRN
  Administered 2022-11-24: 2 mg via INTRAVENOUS

## 2022-11-24 MED ORDER — HEPARIN SODIUM (PORCINE) 5000 UNIT/ML IJ SOLN
INTRAMUSCULAR | Status: AC
  Filled 2022-11-24: qty 1

## 2022-11-24 MED ORDER — OXYCODONE HCL 5 MG PO TABS: 5.0000 mg | ORAL_TABLET | Freq: Once | ORAL | Status: DC | PRN

## 2022-11-24 SURGICAL SUPPLY — 49 items
ADAPTER CATH DIALYSIS 4X8 IT L (MISCELLANEOUS) IMPLANT
BIOPATCH WHT 1IN DISK W/4.0 H (GAUZE/BANDAGES/DRESSINGS) IMPLANT
BLADE SURG 11 STRL SS SAFETY (MISCELLANEOUS) ×1 IMPLANT
CATH EXTENDED DIALYSIS (CATHETERS) ×1 IMPLANT
DERMABOND ADVANCED .7 DNX12 (GAUZE/BANDAGES/DRESSINGS) ×1 IMPLANT
ELECT CAUTERY BLADE 6.4 (BLADE) ×1 IMPLANT
ELECT CAUTERY BLADE TIP 2.5 (TIP) ×1
ELECT REM PT RETURN 9FT ADLT (ELECTROSURGICAL) ×1
ELECTRODE CAUTERY BLDE TIP 2.5 (TIP) IMPLANT
ELECTRODE REM PT RTRN 9FT ADLT (ELECTROSURGICAL) ×1 IMPLANT
GAUZE SPONGE 4X4 12PLY STRL (GAUZE/BANDAGES/DRESSINGS) IMPLANT
GLOVE SURG SYN 7.0 (GLOVE) ×1 IMPLANT
GLOVE SURG SYN 7.0 PF PI (GLOVE) ×1 IMPLANT
GLOVE SURG SYN 7.5  E (GLOVE) ×1
GLOVE SURG SYN 7.5 E (GLOVE) ×1 IMPLANT
GLOVE SURG SYN 7.5 PF PI (GLOVE) ×1 IMPLANT
GOWN STRL REUS W/ TWL LRG LVL3 (GOWN DISPOSABLE) ×2 IMPLANT
GOWN STRL REUS W/TWL LRG LVL3 (GOWN DISPOSABLE) ×3
IRRIGATION STRYKERFLOW (MISCELLANEOUS) IMPLANT
IRRIGATOR STRYKERFLOW (MISCELLANEOUS) ×1
IV NS 1000ML (IV SOLUTION) ×2
IV NS 1000ML BAXH (IV SOLUTION) ×1 IMPLANT
KIT TURNOVER KIT A (KITS) ×1 IMPLANT
LABEL OR SOLS (LABEL) ×1 IMPLANT
MANIFOLD NEPTUNE II (INSTRUMENTS) ×1 IMPLANT
MINICAP W/POVIDONE IODINE SOL (MISCELLANEOUS) ×1 IMPLANT
NDL INSUFFLATION 14GA 120MM (NEEDLE) ×1 IMPLANT
NEEDLE HYPO 22GX1.5 SAFETY (NEEDLE) ×1 IMPLANT
NEEDLE INSUFFLATION 14GA 120MM (NEEDLE) ×1 IMPLANT
NS IRRIG 500ML POUR BTL (IV SOLUTION) ×1 IMPLANT
PACK LAP CHOLECYSTECTOMY (MISCELLANEOUS) ×1 IMPLANT
SET CYSTO W/LG BORE CLAMP LF (SET/KITS/TRAYS/PACK) ×1 IMPLANT
SET TRANSFER 6 W/TWIST CLAMP 5 (SET/KITS/TRAYS/PACK) IMPLANT
SET TUBE SMOKE EVAC HIGH FLOW (TUBING) ×1 IMPLANT
SLEEVE ADV FIXATION 5X100MM (TROCAR) ×2 IMPLANT
SPONGE DRAIN TRACH 4X4 STRL 2S (GAUZE/BANDAGES/DRESSINGS) ×1 IMPLANT
STYLET FALLER (MISCELLANEOUS) IMPLANT
STYLET FALLER MEDIONICS (MISCELLANEOUS) IMPLANT
SUT ETHILON 2 0 FS 18 (SUTURE) ×1 IMPLANT
SUT MNCRL 4-0 (SUTURE) ×2
SUT MNCRL 4-0 27XMFL (SUTURE) ×2
SUT VIC AB 3-0 SH 27 (SUTURE) ×1
SUT VIC AB 3-0 SH 27X BRD (SUTURE) ×1 IMPLANT
SUT VICRYL 0 UR6 27IN ABS (SUTURE) ×1 IMPLANT
SUTURE MNCRL 4-0 27XMF (SUTURE) ×1 IMPLANT
SYS KII FIOS ACCESS ABD 5X100 (TROCAR) ×1
SYSTEM KII FIOS ACES ABD 5X100 (TROCAR) ×1 IMPLANT
TRAP FLUID SMOKE EVACUATOR (MISCELLANEOUS) ×1 IMPLANT
TROCAR KII BLADELESS 5X150 (TROCAR) ×1 IMPLANT

## 2022-11-24 NOTE — Progress Notes (Signed)
Progress Note   Patient: Emma Thomas UDJ:[id number] DOB: Aug 02, 1996 DOA: 11/20/2022     4 DOS: the patient was seen and examined on 11/24/2022   Brief hospital course: 27 year old female with history of type 1 diabetes, end-stage renal disease on peritoneal dialysis, hypertension and anemia presents with left first toe being swollen and found to have osteomyelitis. 1/7.  Surgery canceled today secondary to hypokalemia.  Replacing potassium orally.  Patient also stated that her peritoneal dialysis has not been working well even at home. 1/8.  Dr. Cleda Mccreedy podiatry did a amputation of the left hallux with partial first ray resection and incision and drainage of abscess left forefoot.  Patient had nausea after procedure and given a dose of Phenergan. 1/9.  Dr. Hampton Abbot took to the operating room for laparoscopic peritoneal dialysis revision with removal of fibrin clot.  Patient had nausea after surgery and given a dose of Phenergan  Assessment and Plan: * Acute osteomyelitis ([place]) Osteomyelitis left first toe.  Podiatry did amputation of the left first hallux and partial ray amputation with incision and drainage of the abscess on 1/8.  Pharmacy to dose vancomycin.  On IV Flagyl and ceftriaxone.  Podiatry recommended ID consultation for possible need of IV antibiotics.  Vomiting After both surgical procedures and a dose of Phenergan needed be given.  Hypokalemia Holding water pills.  Replaced aggressively prior to surgical procedures.  Will hold off on further replacement.  PD catheter dysfunction ([place]) Dr. Hampton Abbot did a peritoneal catheter revision with fibrin clot removal.  He recommended emptying bladder prior to doing treatments.  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) ([place]) Usually wears insulin pump at home.  Since patient had vomiting will continue low-dose Semglee insulin to 6 units tonight and use short acting insulin prior to meals.  ESRD on dialysis  [facility]) Nephrology to start peritoneal dialysis tomorrow.  Hyponatremia Sodium 1 point less than normal range.  Anasarca Swelling bilateral lower extremities likely secondary to low albumin and end-stage renal disease.  Anemia of chronic disease Last hemoglobin 7.2.        Subjective: Patient seen after dialysis catheter evaluation in the operating room.  She was vomiting after.  Ordered a dose of Phenergan.  Physical Exam: Vitals:   11/24/22 1300 11/24/22 1315 11/24/22 1343 11/24/22 1400  BP: 121/89 129/82 (!) 145/88 130/83  Pulse: 90 90 (!) 101 (!) 102  Resp: 11 10  16   Temp:  97.7 F (36.5 C) (!) 97.5 F (36.4 C) 98.3 F (36.8 C)  TempSrc:   Oral Oral  SpO2: (!) 88% 98% 96% 100%  Weight:      Height:       Physical Exam HENT:     Head: Normocephalic.     Mouth/Throat:     Pharynx: No oropharyngeal exudate.  Eyes:     General: Lids are normal.     Conjunctiva/sclera: Conjunctivae normal.  Cardiovascular:     Rate and Rhythm: Normal rate and regular rhythm.     Heart sounds: Normal heart sounds, S1 normal and S2 normal.  Pulmonary:     Breath sounds: No decreased breath sounds, wheezing, rhonchi or rales.  Abdominal:     Palpations: Abdomen is soft.     Tenderness: There is no abdominal tenderness.  Musculoskeletal:     Right lower leg: Swelling present.     Left lower leg: Swelling present.  Skin:    General: Skin is warm.     Comments: Status post  left first toe amputation and an pressure dressing  Neurological:     Mental Status: She is alert and oriented to person, place, and time.     Data Reviewed: Sodium 134, creatinine 6.97, hemoglobin 7.2, white blood cell count 8.9, platelet count 462  Disposition: Status is: Inpatient Remains inpatient appropriate because: Podiatry recommended ID consultation for potential of IV antibiotics.  Nephrology would like to use the peritoneal dialysis catheter starting tomorrow.  Planned Discharge Destination:  Home    Time spent: 27 minutes  Author: Loletha Grayer, MD 11/24/2022 4:50 PM  For on call review [email or web address].

## 2022-11-24 NOTE — Anesthesia Postprocedure Evaluation (Signed)
Anesthesia Post Note  Patient: Emma Thomas  Procedure(s) Performed: LAPAROSCOPIC REVISION CONTINUOUS AMBULATORY PERITONEAL DIALYSIS  (CAPD) CATHETER (Abdomen)  Patient location during evaluation: PACU Anesthesia Type: General Level of consciousness: awake and alert Pain management: pain level controlled Vital Signs Assessment: post-procedure vital signs reviewed and stable Respiratory status: spontaneous breathing, nonlabored ventilation, respiratory function stable and patient connected to nasal cannula oxygen Cardiovascular status: blood pressure returned to baseline and stable Postop Assessment: no apparent nausea or vomiting Anesthetic complications: no  There were no known notable events for this encounter.   Last Vitals:  Vitals:   11/24/22 1230 11/24/22 1245  BP: 128/74 134/85  Pulse: 89 92  Resp: 18 (!) 0  Temp:  36.8 C  SpO2: 96% 97%    Last Pain:  Vitals:   11/24/22 1245  TempSrc:   PainSc: Jeannette

## 2022-11-24 NOTE — Progress Notes (Signed)
[place] Hospital Day(s): 4.   Post op day(s): 1 Day Post-Op.   Interval History:  Patient seen and examined No acute events or new complaints overnight.  Patient reports she is doing well this morning Nausea post-operatively yesterday but now resolved No abdominal pain, fever, chills Labs reassuring. Again, BMP consistent with known ESRD NPO this morning Plan for PD catheter revision this morning with dr Hampton Abbot    Vital signs in last 24 hours: [min-max] current  Temp:  [97.6 F (36.4 C)-99.5 F (37.5 C)] 98.2 F (36.8 C) (01/09 0327) Pulse Rate:  [93-101] 93 (01/09 0327) Resp:  [16-21] 16 (01/09 0327) BP: (117-135)/(79-88) 125/81 (01/09 0327) SpO2:  [97 %-100 %] 97 % (01/09 0327) Weight:  [81 kg] 81 kg (01/09 0327)     Height: 5\' 6"  (167.6 cm) Weight: 81 kg BMI (Calculated): 28.84   Intake/Output last 2 shifts:  01/08 0701 - 01/09 0700 In: 1025.3 [I.V.:200; IV Piggyback:825.3] Out: 405 [Urine:400; Blood:5]   Physical Exam:  Constitutional: alert, cooperative and no distress  Respiratory: breathing non-labored at rest  Cardiovascular: regular rate and sinus rhythm  Gastrointestinal: Abdomen is soft, non-tender, non-distended, no rebound/guarding. PD catheter in right abdomen, site CDI   Integumentary: s/p left great toe amputation; sutures in place  Labs:     Latest Ref Rng & Units 11/24/2022    3:38 AM 11/22/2022    4:06 AM 11/21/2022    5:16 AM  CBC  WBC 4.0 - 10.5 K/uL 8.9   14.0   Hemoglobin 12.0 - 15.0 g/dL 7.2  7.9  7.4   Hematocrit 36.0 - 46.0 % 23.2   22.7   Platelets 150 - 400 K/uL 462   411       Latest Ref Rng & Units 11/24/2022    3:38 AM 11/23/2022    3:30 AM 11/22/2022    7:39 PM  CMP  Glucose 70 - 99 mg/dL 110  112  106   BUN 6 - 20 mg/dL 37  42  43   Creatinine 0.44 - 1.00 mg/dL 6.97  7.12  7.33   Sodium 135 - 145 mmol/L 134  135  134   Potassium 3.5 - 5.1 mmol/L 3.8  3.5  3.0   Chloride 98 - 111 mmol/L  104  102  100   CO2 22 - 32 mmol/L 20  21  22    Calcium 8.9 - 10.3 mg/dL 7.7  7.7  7.7      Imaging studies: No new pertinent imaging studies   Assessment/Plan: 27 y.o. female with malfunction of her PD catheter     - Plan for PD catheter revision in the OR today with Dr Hampton Abbot pending OR/Anesthesia availability.  - All risks, benefits, and alternatives to above procedure(s) were discussed with the patient, all of her questions were answered to her expressed satisfaction, patient expresses she wishes to proceed, and informed consent was obtained.     - NPO for surgery; Okay to resume diet post-op   - Further management per primary service; we will follow  All of the above findings and recommendations were discussed with the patient, and the medical team, and all of patient's questions were answered to her expressed satisfaction.  -- Edison Simon, PA-C [facility] 11/24/2022, 7:19 AM M-F: 7am - 4pm

## 2022-11-24 NOTE — Evaluation (Signed)
Physical Therapy Evaluation Patient Details Name: Emma Thomas MRN: [id number] DOB: 01-06-1996 Today's Date: 11/24/2022  History of Present Illness  Pt is a 27 y.o. female with medical history significant of type 1 diabetes, ESRD on PD, and hypertension, who presents to the ED due to ulceration of the left toe.  Pt diagnosed with osteomyelitis of the left hallux and first metatarsal and is s/p amputation of the left hallux with partial first ray resection and I&D of an abscess of the left forefoot. Additionally pt s/p laparoscopic peritoneal dialysis revision with removal of fibrin clot.   Clinical Impression  Pt was pleasant and motivated to participate during the session and put forth good effort throughout. Pt required no physical assistance with bed mobility tasks.  With transfer training from various height surfaces and with hop-to gait pt required CGA and multi-modal cuing for proper sequencing.  Pt able to ambulate with hop-to pattern 12 feet with good control and stability and with pt reported min to mod effort.  Pt limited by nausea during the session and declined crutch training, will defer to next session.  Pt's SpO2 and HR WNL on room air during the session.  Pt will benefit from HHPT upon discharge to safely address deficits listed in patient problem list for decreased caregiver assistance and eventual return to PLOF.           Recommendations for follow up therapy are one component of a multi-disciplinary discharge planning process, led by the attending physician.  Recommendations may be updated based on patient status, additional functional criteria and insurance authorization.  Follow Up Recommendations Home health PT      Assistance Recommended at Discharge Intermittent Supervision/Assistance  Patient can return home with the following  A little help with walking and/or transfers;A little help with bathing/dressing/bathroom;Assistance with cooking/housework;Assist for  transportation;Help with stairs or ramp for entrance    Equipment Recommendations Other (comment) (TBD prior to discharge)  Recommendations for Other Services       Functional Status Assessment Patient has had a recent decline in their functional status and demonstrates the ability to make significant improvements in function in a reasonable and predictable amount of time.     Precautions / Restrictions Precautions Precautions: Fall Restrictions Weight Bearing Restrictions: Yes LLE Weight Bearing: Non weight bearing Other Position/Activity Restrictions: Patient may be minimal weightbearing in her wedge surgical shoe on the left foot with pressure only on the heel using a walker or crutches only for bathroom access, otherwise NWB      Mobility  Bed Mobility Overal bed mobility: Independent             General bed mobility comments: Good speed and effort with bed mobility tasks    Transfers Overall transfer level: Needs assistance Equipment used: Rolling walker (2 wheels) Transfers: Sit to/from Stand Sit to Stand: Supervision           General transfer comment: Good control and stability from various height surfaces with cues for proper sequencing    Ambulation/Gait Ambulation/Gait assistance: Min guard Gait Distance (Feet): 12 Feet Assistive device: Rolling walker (2 wheels)   Gait velocity: decreased     General Gait Details: Pt able to amb 12 feet with hop-to sequencing with wedge shoe donned but held off of the floor throughout; mod verbal and visual cues for proper sequencing with hop to gait pattern as well as during 90 to 180 deg turns within the RW; pt steady throughout without LOB or buckling  Stairs  Wheelchair Mobility    Modified Rankin (Stroke Patients Only)       Balance Overall balance assessment: No apparent balance deficits (not formally assessed)                                           Pertinent  Vitals/Pain Pain Assessment Pain Assessment: 0-10 Pain Score: 7  Pain Location: L foot Pain Descriptors / Indicators: Sore Pain Intervention(s): Repositioned, Monitored during session, Other (comment) (Pt declined pain meds)    Home Living Family/patient expects to be discharged to:: Private residence Living Arrangements: Spouse/significant other;Children Available Help at Discharge: Family;Available 24 hours/day Type of Home: House Home Access: Level entry     Alternate Level Stairs-Number of Steps: 14 Home Layout: Two level Home Equipment: Shower seat Additional Comments: Pt has PD upstairs with no way to bring equipment down to 1st floor    Prior Function Prior Level of Function : Independent/Modified Independent             Mobility Comments: Ind amb community distances without an AD, no fall history ADLs Comments: Ind with ADLs     Hand Dominance        Extremity/Trunk Assessment   Upper Extremity Assessment Upper Extremity Assessment: Overall WFL for tasks assessed    Lower Extremity Assessment Lower Extremity Assessment: LLE deficits/detail LLE: Unable to fully assess due to pain       Communication   Communication: No difficulties  Cognition Arousal/Alertness: Awake/alert Behavior During Therapy: WFL for tasks assessed/performed Overall Cognitive Status: Within Functional Limits for tasks assessed                                          General Comments      Exercises     Assessment/Plan    PT Assessment Patient needs continued PT services  PT Problem List Decreased activity tolerance;Decreased mobility;Decreased knowledge of use of DME;Decreased knowledge of precautions;Pain       PT Treatment Interventions DME instruction;Gait training;Stair training;Functional mobility training;Therapeutic activities;Therapeutic exercise;Balance training;Patient/family education    PT Goals (Current goals can be found in the Care Plan  section)  Acute Rehab PT Goals Patient Stated Goal: To get back to being independent PT Goal Formulation: With patient Time For Goal Achievement: 12/07/22 Potential to Achieve Goals: Good    Frequency 7X/week     Co-evaluation               AM-PAC PT "6 Clicks" Mobility  Outcome Measure Help needed turning from your back to your side while in a flat bed without using bedrails?: None Help needed moving from lying on your back to sitting on the side of a flat bed without using bedrails?: None Help needed moving to and from a bed to a chair (including a wheelchair)?: A Little Help needed standing up from a chair using your arms (e.g., wheelchair or bedside chair)?: A Little Help needed to walk in hospital room?: A Little Help needed climbing 3-5 steps with a railing? : A Lot 6 Click Score: 19    End of Session Equipment Utilized During Treatment: Gait belt Activity Tolerance: Other (comment) (limited by nausea) Patient left: Other (comment);with call bell/phone within reach (Pt left on [facility] with call bell, nursing notified) Nurse Communication: Mobility status;Weight  bearing status;Other (comment) (Pt left on [facility] at end of session) PT Visit Diagnosis: Difficulty in walking, not elsewhere classified (R26.2);Pain Pain - Right/Left: Left Pain - part of body: Ankle and joints of foot    Time: 9417-4081 PT Time Calculation (min) (ACUTE ONLY): 27 min   Charges:   PT Evaluation $PT Eval Moderate Complexity: 1 Mod PT Treatments $Gait Training: 8-22 mins       D. Scott Levander Katzenstein PT, DPT 11/24/22, 5:25 PM

## 2022-11-24 NOTE — Progress Notes (Signed)
[facility]  PROGRESS NOTE   Subjective:   Patient seen after procedure, alert and oriented Continues to complain of nausea  Objective:  Vital signs: Blood pressure (!) 145/88, pulse (!) 101, temperature (!) 97.5 F (36.4 C), temperature source Oral, resp. rate 16, height 5\' 6"  (1.676 m), weight 68 kg, last menstrual period 11/11/2022, SpO2 96 %.  Intake/Output Summary (Last 24 hours) at 11/24/2022 1507 Last data filed at 11/24/2022 1202 Gross per 24 hour  Intake 1675.33 ml  Output 400 ml  Net 1275.33 ml    Filed Weights   11/23/22 0429 11/24/22 0327 11/24/22 0854  Weight: 78.5 kg 81 kg 68 kg     Physical Exam: General:  No acute distress  Head:  Normocephalic, atraumatic. Moist oral mucosal membranes  Eyes:  Anicteric  Lungs:   Clear to auscultation, normal effort  Heart:  S1S2 no rubs  Abdomen:   Soft, nontender, bowel sounds present, PDC  Extremities: Trace peripheral edema.  Neurologic:  Awake, alert, following commands  Skin:   left great toe wound  Access: PD catheter    Basic Metabolic Panel: Recent Labs  Lab 11/20/22 1949 11/21/22 0516 11/22/22 0406 11/22/22 1939 11/23/22 0330 11/24/22 0338  NA 133* 132* 133* 134* 135 134*  K 2.9* 3.0* 2.8* 3.0* 3.5 3.8  CL 96* 99 99 100 102 104  CO2 22 21* 21* 22 21* 20*  GLUCOSE 152* 140* 206* 106* 112* 110*  BUN 40* 41* 42* 43* 42* 37*  CREATININE 6.29* 6.45* 6.69* 7.33* 7.12* 6.97*  CALCIUM 7.4* 7.2* 7.7* 7.7* 7.7* 7.7*  MG 1.8  --  2.0  --   --   --   PHOS  --   --  4.7*  --   --   --     GFR: Estimated Creatinine Clearance: 11.2 mL/min (A) (by C-G formula based on SCr of 6.97 mg/dL (H)).  Liver Function Tests: Recent Labs  Lab 11/20/22 1107 11/21/22 0516  AST 74* 53*  ALT 63* 50*  ALKPHOS 79 67  BILITOT 0.9 0.8  PROT 7.8 6.4*  ALBUMIN 2.2* 1.8*    No results for input(s): "LIPASE", "AMYLASE" in the last 168 hours. No results for input(s): "AMMONIA" in the last 168  hours.  CBC: Recent Labs  Lab 11/20/22 1107 11/21/22 0516 11/22/22 0406 11/24/22 0338  WBC 14.7* 14.0*  --  8.9  NEUTROABS 11.0* 11.2*  --   --   HGB 8.6* 7.4* 7.9* 7.2*  HCT 27.6* 22.7*  --  23.2*  MCV 85.4 84.7  --  86.2  PLT 490* 411*  --  462*      HbA1C: Hgb A1c MFr Bld  Date/Time Value Ref Range Status  11/20/2022 07:49 PM 7.9 (H) 4.8 - 5.6 % Final    Comment:    (NOTE)         Prediabetes: 5.7 - 6.4         Diabetes: >6.4         Glycemic control for adults with diabetes: <7.0   04/19/2022 05:05 PM 9.4 (H) 4.8 - 5.6 % Final    Comment:    (NOTE) Pre diabetes:          5.7%-6.4%  Diabetes:              >6.4%  Glycemic control for   <7.0% adults with diabetes     Urinalysis: No results for input(s): "COLORURINE", "LABSPEC", "PHURINE", "GLUCOSEU", "HGBUR", "BILIRUBINUR", "KETONESUR", "PROTEINUR", "UROBILINOGEN", "NITRITE", "LEUKOCYTESUR"  in the last 72 hours.  Invalid input(s): "APPERANCEUR"    Imaging: DG Abd 2 Views  Result Date: 11/23/2022 CLINICAL DATA:  Peritoneal dialysis catheter dysfunction EXAM: ABDOMEN - 2 VIEW COMPARISON:  CT done on 04/19/2022 FINDINGS: Bowel gas pattern is nonspecific. Peritoneal dialysis catheter is noted entering the right abdominal wall with its tip overlying the sacrum at the level of pelvic inlet. In the upright view, right hemidiaphragm is not included limiting evaluation for pneumoperitoneum. No abnormal masses or calcifications are seen. Kidneys are partly obscured by bowel contents. There is edema in subcutaneous plane. IMPRESSION: Nonspecific bowel gas pattern. Tip of peritoneal dialysis catheter is noted in lower abdomen at the level of pelvic inlet in midline. There is no definite identifiable focal kink in the peritoneal dialysis catheter. There is edema in subcutaneous plane suggesting possible anasarca. Electronically Signed   By: Elmer Picker M.D.   On: 11/23/2022 10:51     Medications:    cefTRIAXone  (ROCEPHIN)  IV Stopped (11/23/22 1633)   dialysis solution 1.5% low-MG/low-CA     metronidazole 500 mg (11/24/22 0425)   promethazine (PHENERGAN) injection (IM or IVPB) 12.5 mg (11/24/22 1505)    amLODipine  10 mg Oral Daily   chlorhexidine       gentamicin cream  1 Application Topical Daily   [START ON 11/25/2022] heparin  5,000 Units Subcutaneous Q8H   insulin aspart  0-5 Units Subcutaneous QHS   insulin aspart  0-9 Units Subcutaneous TID WC   insulin aspart  3 Units Subcutaneous TID WC   insulin glargine-yfgn  6 Units Subcutaneous Q24H   losartan  100 mg Oral Daily   montelukast  10 mg Oral QHS   multivitamin with minerals  1 tablet Oral Daily   vancomycin variable dose per unstable renal function (pharmacist dosing)   Does not apply See admin instructions    Assessment/ Plan:     27 year old female with history of type 1 diabetes, end-stage renal disease on peritoneal dialysis, hypertension now admitted with history of left toe ulceration.  She had an MRI showing osteomyelitis.  She was found to have hypokalemia and had multiple potassium supplements.   ESRD on peritoneal dialysis: Patient has PD catheter malfunction.  Appreciate surgery evaluation and revision today.  Items of note include a fibrin clot removal from catheter and catheter appears positional.  Per surgeons request, will allow PD catheter to rest tonight and initiate low volume treatments in AM.   ANEMIA with chronic kidney disease: Hemoglobin remains decreased.  Will monitor and consider ESA.   Secondary Hyperparathyroidism: with outpatient labs: PTH 291, phosphorus 4.4, calcium 7.7 on 11/19/22.   Lab Results  Component Value Date   CALCIUM 7.7 (L) 11/24/2022   CAION 0.89 (LL) 07/16/2022   PHOS 4.7 (H) 11/22/2022  Will continue to monitor bone minerals.    HTN/VOL with chronic kidney disease: Continue losartan and amlodipine.  Blood pressure stable, 145/88.   ACCESS: PD catheter has not been working well.   Surgery revision completed and will resume use tomorrow.   Hypokalemia: Corrected with supplementation.     Acute osteomyelitis: Continue antibiotics with Rocephin, vancomycin and metronidazole. Surgery performed left great toe amputation on 11/24/2022.  Diabetes mellitus, type 2 with chronic kidney disease: Continue insulin doses as ordered.   LOS: [facility] 1/9/20243:07 PM

## 2022-11-24 NOTE — Transfer of Care (Signed)
Immediate Anesthesia Transfer of Care Note  Patient: Emma Thomas  Procedure(s) Performed: LAPAROSCOPIC REVISION CONTINUOUS AMBULATORY PERITONEAL DIALYSIS  (CAPD) CATHETER (Abdomen)  Patient Location: PACU  Anesthesia Type:General  Level of Consciousness: awake, alert , and oriented  Airway & Oxygen Therapy: Patient Spontanous Breathing and Patient connected to face mask oxygen  Post-op Assessment: Report given to RN and Post -op Vital signs reviewed and stable  Post vital signs: stable  Last Vitals:  Vitals Value Taken Time  BP 128/78 11/24/22 1215  Temp    Pulse 97 11/24/22 1217  Resp 13 11/24/22 1217  SpO2 94 % 11/24/22 1217  Vitals shown include unvalidated device data.  Last Pain:  Vitals:   11/24/22 0854  TempSrc: Oral  PainSc: 0-No pain      Patients Stated Pain Goal: 0 (50/51/83 3582)  Complications: No notable events documented.

## 2022-11-24 NOTE — Assessment & Plan Note (Signed)
After both surgical procedures and a dose of Phenergan needed be given.

## 2022-11-24 NOTE — Anesthesia Procedure Notes (Signed)
Procedure Name: Intubation Date/Time: 11/24/2022 10:38 AM  Performed by: Patience Musca., CRNAPre-anesthesia Checklist: Patient identified, Patient being monitored, Timeout performed, Emergency Drugs available and Suction available Patient Re-evaluated:Patient Re-evaluated prior to induction Oxygen Delivery Method: Circle system utilized Preoxygenation: Pre-oxygenation with 100% oxygen Induction Type: IV induction Ventilation: Mask ventilation without difficulty Laryngoscope Size: Mac, 3 and McGraph Grade View: Grade I Tube type: Oral Tube size: 7.0 mm Number of attempts: 1 Airway Equipment and Method: Stylet Placement Confirmation: ETT inserted through vocal cords under direct vision, positive ETCO2 and breath sounds checked- equal and bilateral Secured at: 21 cm Tube secured with: Tape Dental Injury: Teeth and Oropharynx as per pre-operative assessment

## 2022-11-24 NOTE — Consult Note (Incomplete)
NAME: Emma Thomas  DOB: 10/14/1996  MRN: [id number]  Date/Time: 11/24/2022 3:47 PM  REQUESTING PROVIDER Subjective:  REASON FOR CONSULT:  ? Emma Thomas is a 27 y.o. with a history of   ID   Steroid/immune suppressants/splenectomy/Hardware Recent Procedure Surgery Injections Trauma Sick contacts Travel Antibiotic use Food- raw/exotic Animal bites Tick exposure Water sports Fishing/hunting/animal bird exposure Past Medical History:  Diagnosis Date   Anemia    Diabetes mellitus without complication ([place])    Type 1 DM   Essential hypertension    Headache    Hypertension 03/04/2013   Neurologic disorder    Both feet   Recurrent UTI    Renal disorder     Past Surgical History:  Procedure Laterality Date   abscess removal     excision of bartholin cyst   AMPUTATION Left 11/23/2022   Procedure: LEFT GREAT TOE PARTIAL RAY AMPUTATION;  Surgeon: Sharlotte Alamo, DPM;  Location: [facility];  Service: Podiatry;  Laterality: Left;   CAPD INSERTION N/A 06/09/2022   Procedure: LAPAROSCOPIC INSERTION CONTINUOUS AMBULATORY PERITONEAL DIALYSIS  (CAPD) CATHETER, PD rep to be present;  Surgeon: Olean Ree, MD;  Location: [facility];  Service: General;  Laterality: N/A;   CAPD REVISION N/A 07/16/2022   Procedure: LAPAROSCOPIC REVISION CONTINUOUS AMBULATORY PERITONEAL DIALYSIS  (CAPD) CATHETER;  Surgeon: Olean Ree, MD;  Location: [facility];  Service: General;  Laterality: N/A;   DIALYSIS/PERMA CATHETER INSERTION N/A 04/24/2022   Procedure: DIALYSIS/PERMA CATHETER INSERTION;  Surgeon: Katha Cabal, MD;  Location: [facility];  Service: Cardiovascular;  Laterality: N/A;   DIALYSIS/PERMA CATHETER REMOVAL N/A 06/02/2022   Procedure: DIALYSIS/PERMA CATHETER REMOVAL;  Surgeon: Katha Cabal, MD;  Location: [facility];  Service: Cardiovascular;  Laterality: N/A;   INSERTION OF DIALYSIS CATHETER Right    ARM   Nexplanon  [id number]   UMBILICAL HERNIA REPAIR N/A  06/09/2022   Procedure: HERNIA REPAIR UMBILICAL ADULT;  Surgeon: Olean Ree, MD;  Location: [facility];  Service: General;  Laterality: N/A;    Social History   Socioeconomic History   Marital status: Married    Spouse name: Emma Thomas   Number of children: 1   Years of education: Not on file   Highest education level: Not on file  Occupational History   Not on file  Tobacco Use   Smoking status: Never    Passive exposure: Past   Smokeless tobacco: Never  Vaping Use   Vaping Use: Never used  Substance and Sexual Activity   Alcohol use: Not Currently    Alcohol/week: 2.0 standard drinks of alcohol    Types: 2 Shots of liquor per week   Drug use: No   Sexual activity: Yes    Birth control/protection: None  Other Topics Concern   Not on file  Social History Narrative   Not on file   Social Determinants of Health   Financial Resource Strain: Not on file  Food Insecurity: No Food Insecurity (11/21/2022)   Hunger Vital Sign    Worried About Running Out of Food in the Last Year: Never true    Ran Out of Food in the Last Year: Never true  Transportation Needs: No Transportation Needs (11/21/2022)   PRAPARE - Hydrologist (Medical): No    Lack of Transportation (Non-Medical): No  Physical Activity: Not on file  Stress: Not on file  Social Connections: Not on file  Intimate Partner Violence: Not At Risk (11/21/2022)  Humiliation, Afraid, Rape, and Kick questionnaire    Fear of Current or Ex-Partner: No    Emotionally Abused: No    Physically Abused: No    Sexually Abused: No    Family History  Problem Relation Age of Onset   Breast cancer Mother 55   Lung cancer Maternal Grandmother    Diabetes type II Paternal Grandmother    No Known Allergies I? Current Facility-Administered Medications  Medication Dose Route Frequency Provider Last Rate Last Admin   acetaminophen (TYLENOL) tablet 650 mg  650 mg Oral Q6H PRN Piscoya, Jose, MD   650 mg at  11/21/22 2041   Or   acetaminophen (TYLENOL) suppository 650 mg  650 mg Rectal Q6H PRN Piscoya, Jose, MD       amLODipine (NORVASC) tablet 10 mg  10 mg Oral Daily Piscoya, Jose, MD   10 mg at 11/22/22 0835   cefTRIAXone (ROCEPHIN) 2 g in sodium chloride 0.9 % 100 mL IVPB  2 g Intravenous Q24H Piscoya, Jose, MD   Stopped at 11/23/22 1633   chlorhexidine (PERIDEX) 0.12 % solution            dialysis solution 1.5% low-MG/low-CA dianeal solution   Intraperitoneal Q24H Breeze, Shantelle, NP       gentamicin cream (GARAMYCIN) 0.1 % 1 Application  1 Application Topical Daily Piscoya, Jose, MD   1 Application at 27/74/12 0853   [START ON 11/25/2022] heparin injection 5,000 Units  5,000 Units Subcutaneous Q8H Piscoya, Jose, MD       HYDROmorphone (DILAUDID) injection 0.5 mg  0.5 mg Intravenous Q4H PRN Piscoya, Jose, MD   1 mg at 11/24/22 1040   insulin aspart (novoLOG) injection 0-5 Units  0-5 Units Subcutaneous QHS Piscoya, Jose, MD       insulin aspart (novoLOG) injection 0-9 Units  0-9 Units Subcutaneous TID WC Piscoya, Jose, MD   2 Units at 11/22/22 1337   insulin aspart (novoLOG) injection 3 Units  3 Units Subcutaneous TID WC Piscoya, Jacqulyn Bath, MD   3 Units at 11/22/22 1338   insulin glargine-yfgn (SEMGLEE) injection 6 Units  6 Units Subcutaneous Q24H Piscoya, Jose, MD   6 Units at 11/23/22 1657   losartan (COZAAR) tablet 100 mg  100 mg Oral Daily Piscoya, Jose, MD   100 mg at 11/22/22 0835   metroNIDAZOLE (FLAGYL) IVPB 500 mg  500 mg Intravenous Q12H Piscoya, Jose, MD 100 mL/hr at 11/24/22 0425 500 mg at 11/24/22 0425   montelukast (SINGULAIR) tablet 10 mg  10 mg Oral QHS Piscoya, Jose, MD   10 mg at 11/23/22 2130   multivitamin with minerals tablet 1 tablet  1 tablet Oral Daily Piscoya, Jose, MD   1 tablet at 11/22/22 0834   ondansetron (ZOFRAN) tablet 4 mg  4 mg Oral Q6H PRN Piscoya, Jacqulyn Bath, MD       Or   ondansetron (ZOFRAN) injection 4 mg  4 mg Intravenous Q6H PRN Piscoya, Jose, MD   4 mg at 11/24/22  1358   oxyCODONE (Oxy IR/ROXICODONE) immediate release tablet 5 mg  5 mg Oral Q4H PRN Piscoya, Jose, MD   5 mg at 11/23/22 2129   polyethylene glycol (MIRALAX / GLYCOLAX) packet 17 g  17 g Oral Daily PRN Piscoya, Jose, MD       vancomycin variable dose per unstable renal function (pharmacist dosing)   Does not apply See admin instructions Olean Ree, MD         Abtx:  Anti-infectives (From admission, onward)  Start     Dose/Rate Route Frequency Ordered Stop   11/24/22 1000  ceFAZolin (ANCEF) IVPB 2g/100 mL premix        2 g 200 mL/hr over 30 Minutes Intravenous  Once 11/24/22 0958 11/24/22 1015   11/24/22 0955  ceFAZolin (ANCEF) 2-4 GM/100ML-% IVPB       Note to Pharmacy: Register, Santiago Glad A: cabinet override      11/24/22 0955 11/24/22 1027   11/23/22 1318  vancomycin (VANCOCIN) powder  Status:  Discontinued          As needed 11/23/22 1319 11/23/22 1323   11/23/22 1100  vancomycin (VANCOREADY) IVPB 1250 mg/250 mL        1,250 mg 166.7 mL/hr over 90 Minutes Intravenous  Once 11/23/22 0836 11/23/22 1817   11/22/22 1359  vancomycin variable dose per unstable renal function (pharmacist dosing)         Does not apply See admin instructions 11/22/22 1359     11/20/22 1400  vancomycin (VANCOREADY) IVPB 1250 mg/250 mL        1,250 mg 166.7 mL/hr over 90 Minutes Intravenous  Once 11/20/22 1325 11/20/22 2126   11/20/22 1330  cefTRIAXone (ROCEPHIN) 2 g in sodium chloride 0.9 % 100 mL IVPB        2 g 200 mL/hr over 30 Minutes Intravenous Every 24 hours 11/20/22 1315 11/27/22 1314   11/20/22 1330  metroNIDAZOLE (FLAGYL) IVPB 500 mg        500 mg 100 mL/hr over 60 Minutes Intravenous Every 12 hours 11/20/22 1315 11/27/22 1314       REVIEW OF SYSTEMS:  Const: negative fever, negative chills, negative weight loss Eyes: negative diplopia or visual changes, negative eye pain ENT: negative coryza, negative sore throat Resp: negative cough, hemoptysis, dyspnea Cards: negative for chest  pain, palpitations, lower extremity edema GU: negative for frequency, dysuria and hematuria GI: Negative for abdominal pain, diarrhea, bleeding, constipation Skin: negative for rash and pruritus Heme: negative for easy bruising and gum/nose bleeding MS: negative for myalgias, arthralgias, back pain and muscle weakness Neurolo:negative for headaches, dizziness, vertigo, memory problems  Psych: negative for feelings of anxiety, depression  Endocrine: negative for thyroid, diabetes Allergy/Immunology- negative for any medication or food allergies ? Pertinent Positives include : Objective:  VITALS:  BP 130/83 (BP Location: Left Arm)   Pulse (!) 102   Temp 98.3 F (36.8 C) (Oral)   Resp 16   Ht 5\' 6"  (1.676 m)   Wt 68 kg   LMP 11/11/2022 (Exact Date)   SpO2 100%   BMI 24.21 kg/m  LDA Foley Central line Other drainage tubes PHYSICAL EXAM:  General: Alert, cooperative, no distress, appears stated age.  Head: Normocephalic, without obvious abnormality, atraumatic. Eyes: Conjunctivae clear, anicteric sclerae. Pupils are equal ENT Nares normal. No drainage or sinus tenderness. Lips, mucosa, and tongue normal. No Thrush Neck: Supple, symmetrical, no adenopathy, thyroid: non tender no carotid bruit and no JVD. Back: No CVA tenderness. Lungs: Clear to auscultation bilaterally. No Wheezing or Rhonchi. No rales. Heart: Regular rate and rhythm, no murmur, rub or gallop. Abdomen: Soft, non-tender,not distended. Bowel sounds normal. No masses Extremities: atraumatic, no cyanosis. No edema. No clubbing Skin: No rashes or lesions. Or bruising Lymph: Cervical, supraclavicular normal. Neurologic: Grossly non-focal Pertinent Labs Lab Results CBC    Component Value Date/Time   WBC 8.9 11/24/2022 0338   RBC 2.69 (L) 11/24/2022 0338   HGB 7.2 (L) 11/24/2022 0338   HGB 8.7 (L) 10/25/2019  1022   HCT 23.2 (L) 11/24/2022 0338   HCT 26.0 (L) 10/25/2019 1022   PLT 462 (H) 11/24/2022 0338    PLT 358 10/25/2019 1022   MCV 86.2 11/24/2022 0338   MCV 89 10/25/2019 1022   MCH 26.8 11/24/2022 0338   MCHC 31.0 11/24/2022 0338   RDW 12.0 11/24/2022 0338   RDW 11.8 10/25/2019 1022   LYMPHSABS 1.7 11/21/2022 0516   MONOABS 0.7 11/21/2022 0516   EOSABS 0.3 11/21/2022 0516   BASOSABS 0.1 11/21/2022 0516       Latest Ref Rng & Units 11/24/2022    3:38 AM 11/23/2022    3:30 AM 11/22/2022    7:39 PM  CMP  Glucose 70 - 99 mg/dL 110  112  106   BUN 6 - 20 mg/dL 37  42  43   Creatinine 0.44 - 1.00 mg/dL 6.97  7.12  7.33   Sodium 135 - 145 mmol/L 134  135  134   Potassium 3.5 - 5.1 mmol/L 3.8  3.5  3.0   Chloride 98 - 111 mmol/L 104  102  100   CO2 22 - 32 mmol/L 20  21  22    Calcium 8.9 - 10.3 mg/dL 7.7  7.7  7.7       Microbiology: Recent Results (from the past 240 hour(s))  Blood Cultures x 2 sites     Status: None (Preliminary result)   Collection Time: 11/20/22  4:38 PM   Specimen: BLOOD LEFT HAND  Result Value Ref Range Status   Specimen Description BLOOD LEFT HAND  Final   Special Requests   Final    BOTTLES DRAWN AEROBIC AND ANAEROBIC Blood Culture results may not be optimal due to an inadequate volume of blood received in culture bottles   Culture   Final    NO GROWTH 4 DAYS Performed at [facility], [address] [place], [place] [address]    Report Status PENDING  Incomplete  Blood Cultures x 2 sites     Status: None (Preliminary result)   Collection Time: 11/20/22  5:15 PM   Specimen: BLOOD LEFT FOREARM  Result Value Ref Range Status   Specimen Description BLOOD LEFT FOREARM  Final   Special Requests   Final    BOTTLES DRAWN AEROBIC AND ANAEROBIC Blood Culture adequate volume   Culture   Final    NO GROWTH 4 DAYS Performed at [facility], [address] [place], [place] [address]    Report Status PENDING  Incomplete  Surgical pcr screen     Status: None   Collection Time: 11/23/22 11:08 AM   Specimen: Nasal Mucosa; Nasal Swab   Result Value Ref Range Status   MRSA, PCR NEGATIVE NEGATIVE Final   Staphylococcus aureus NEGATIVE NEGATIVE Final    Comment: (NOTE) The Xpert SA Assay (FDA approved for NASAL specimens in patients 87 years of age and older), is one component of a comprehensive surveillance program. It is not intended to diagnose infection nor to guide or monitor treatment. Performed at [facility], [address] [place], [place] [address]   Aerobic/Anaerobic Culture w Gram Stain (surgical/deep wound)     Status: None (Preliminary result)   Collection Time: 11/23/22  1:03 PM   Specimen: Wound; Abscess  Result Value Ref Range Status   Specimen Description   Final    TISSUE Performed at [facility], [address] [place], [place] [address]    Special Requests   Final  LEFT FOOT Performed at [facility], [place], [place] [address]    Gram Stain NO WBC SEEN NO ORGANISMS SEEN   Final   Culture   Final    NO GROWTH < 24 HOURS Performed at [facility], [place] [address], [place], [place] [address]    Report Status PENDING  Incomplete  Aerobic/Anaerobic Culture w Gram Stain (surgical/deep wound)     Status: None (Preliminary result)   Collection Time: 11/23/22  1:03 PM   Specimen: Foot, Left; Tissue  Result Value Ref Range Status   Specimen Description   Final    ABSCESS Performed at [facility], [address], [place], [place] [address]    Special Requests   Final    LEFT FOOT Performed at [facility], [address] [place], [place] [address]    Gram Stain NO WBC SEEN RARE GRAM POSITIVE COCCI IN PAIRS   Final   Culture   Final    TOO YOUNG TO READ Performed at [facility], [place] [address], [place], [place] [address]    Report Status PENDING  Incomplete    IMAGING RESULTS: I have personally reviewed the  films ? Impression/Recommendation ? ? ? ___________________________________________________ Discussed with patient, requesting provider Note:  This document was prepared using Dragon voice recognition software and may include unintentional dictation errors.

## 2022-11-24 NOTE — Progress Notes (Signed)
Day of Surgery   Subjective/Chief Complaint: Patient seen.  Some significant pain overnight.  States the oxycodone is not really helping much.   Objective: Vital signs in last 24 hours: Temp:  [97.6 F (36.4 C)-99.5 F (37.5 C)] 98.4 F (36.9 C) (01/09 0854) Pulse Rate:  [93-99] 97 (01/09 0854) Resp:  [16-21] 18 (01/09 0854) BP: (117-136)/(79-91) 136/91 (01/09 0854) SpO2:  [97 %-100 %] 98 % (01/09 0854) Weight:  [68 kg-81 kg] 68 kg (01/09 0854) Last BM Date : 11/22/22  Intake/Output from previous day: 01/08 0701 - 01/09 0700 In: 1025.3 [I.V.:200; IV Piggyback:825.3] Out: 405 [Urine:400; Blood:5] Intake/Output this shift: Total I/O In: 850 [I.V.:400; Blood:350; IV Piggyback:100] Out: -   Bandage on the left foot is dry and intact.  Upon removal mild to moderate bleeding noted on the bandaging.  Some mild drainage consistent with implanted antibiotic beads.  No purulence noted.  Incision is well coapted with improving edema and erythema.  Lab Results:  Recent Labs    11/22/22 0406 11/24/22 0338  WBC  --  8.9  HGB 7.9* 7.2*  HCT  --  23.2*  PLT  --  462*   BMET Recent Labs    11/23/22 0330 11/24/22 0338  NA 135 134*  K 3.5 3.8  CL 102 104  CO2 21* 20*  GLUCOSE 112* 110*  BUN 42* 37*  CREATININE 7.12* 6.97*  CALCIUM 7.7* 7.7*   PT/INR No results for input(s): "LABPROT", "INR" in the last 72 hours. ABG No results for input(s): "PHART", "HCO3" in the last 72 hours.  Invalid input(s): "PCO2", "PO2"  Studies/Results: DG Abd 2 Views  Result Date: 11/23/2022 CLINICAL DATA:  Peritoneal dialysis catheter dysfunction EXAM: ABDOMEN - 2 VIEW COMPARISON:  CT done on 04/19/2022 FINDINGS: Bowel gas pattern is nonspecific. Peritoneal dialysis catheter is noted entering the right abdominal wall with its tip overlying the sacrum at the level of pelvic inlet. In the upright view, right hemidiaphragm is not included limiting evaluation for pneumoperitoneum. No abnormal masses  or calcifications are seen. Kidneys are partly obscured by bowel contents. There is edema in subcutaneous plane. IMPRESSION: Nonspecific bowel gas pattern. Tip of peritoneal dialysis catheter is noted in lower abdomen at the level of pelvic inlet in midline. There is no definite identifiable focal kink in the peritoneal dialysis catheter. There is edema in subcutaneous plane suggesting possible anasarca. Electronically Signed   By: Elmer Picker M.D.   On: 11/23/2022 10:51    Anti-infectives: Anti-infectives (From admission, onward)    Start     Dose/Rate Route Frequency Ordered Stop   11/24/22 1000  ceFAZolin (ANCEF) IVPB 2g/100 mL premix        2 g 200 mL/hr over 30 Minutes Intravenous  Once 11/24/22 0958 11/24/22 1015   11/24/22 0955  ceFAZolin (ANCEF) 2-4 GM/100ML-% IVPB       Note to Pharmacy: Register, Santiago Glad A: cabinet override      11/24/22 0955 11/24/22 1027   11/23/22 1318  vancomycin (VANCOCIN) powder  Status:  Discontinued          As needed 11/23/22 1319 11/23/22 1323   11/23/22 1100  vancomycin (VANCOREADY) IVPB 1250 mg/250 mL        1,250 mg 166.7 mL/hr over 90 Minutes Intravenous  Once 11/23/22 0836 11/23/22 1817   11/22/22 1359  [MAR Hold]  vancomycin variable dose per unstable renal function (pharmacist dosing)        (MAR Hold since Tue 11/24/2022 at 0846.Hold Reason:  Transfer to a Procedural area)    Does not apply See admin instructions 11/22/22 1359     11/20/22 1400  vancomycin (VANCOREADY) IVPB 1250 mg/250 mL        1,250 mg 166.7 mL/hr over 90 Minutes Intravenous  Once 11/20/22 1325 11/20/22 2126   11/20/22 1330  [MAR Hold]  cefTRIAXone (ROCEPHIN) 2 g in sodium chloride 0.9 % 100 mL IVPB        (MAR Hold since Tue 11/24/2022 at 0846.Hold Reason: Transfer to a Procedural area)   2 g 200 mL/hr over 30 Minutes Intravenous Every 24 hours 11/20/22 1315 11/27/22 1314   11/20/22 1330  [MAR Hold]  metroNIDAZOLE (FLAGYL) IVPB 500 mg        (MAR Hold since Tue 11/24/2022 at  0846.Hold Reason: Transfer to a Procedural area)   500 mg 100 mL/hr over 60 Minutes Intravenous Every 12 hours 11/20/22 1315 11/27/22 1314       Assessment/Plan: s/p Procedure(s): LAPAROSCOPIC REVISION CONTINUOUS AMBULATORY PERITONEAL DIALYSIS  (CAPD) CATHETER (N/A) Assessment: Stable status post first ray resection.  Plan: Betadine gauze applied to the incision followed by a sterile gauze bandage.  Patient instructed to keep this clean, dry, and do not remove.  Discussed that she may weight-bear with pressure only on her heel on the left foot using her wedge surgical shoe.  Discussed with the patient that I will change her bandage in the clinic in approximately 1 week.  Waiting for culture and pathology results.  Most likely will consider consult for infectious disease as she potentially may need IV antibiotics outpatient.  LOS: 4 days    Durward Fortes 11/24/2022

## 2022-11-24 NOTE — Op Note (Signed)
  Procedure Date:  11/24/2022  Pre-operative Diagnosis:  Peritoneal dialysis catheter malfunction  Post-operative Diagnosis:  Peritoneal dialysis malfunction  Procedure:  Laparoscopic peritoneal dialysis revision with removal of fibrin clot  Surgeon:  Melvyn Neth, MD  Anesthesia:  General endotracheal  Estimated Blood Loss:  5 ml  Specimens:  None  Complications:  None  Indications for Procedure:  This is a 27 y.o. female with peritoneal dialysis catheter malfunction, requiring revision.  The risks of bleeding, abscess or infection, potential for an open procedure, catheter failure, and injury to surrounding structures were all discussed with the patient and she was willing to proceed.  Description of Procedure: The patient was correctly identified in the preoperative area and brought into the operating room.  The patient was placed supine with VTE prophylaxis in place.  Appropriate time-outs were performed.  Anesthesia was induced and the patient was intubated.  Appropriate antibiotics were infused.  The abdomen was prepped and draped in a sterile fashion including the peritoneal dialysis catheter up to the titanium connector/adaptor.  A Veress needle was inserted in the left upper quadrant and pneumoperitoneum was obtained with good opening pressures.  Using optiview technique, a 5 mm port was introduced via the left lateral abdomen.  Then, an additional 5 mm port was place in the left upper quadrant at the site of the Veress needle under direct visualization.  The patient was noted to have ascites which was suctioned, for about 500 ml, likely residual from the prior dialysis attempts.  Her bladder was noted to be full.  Then, the catheter was moved anteriorly and was noted to have a small piece of fibrin clot within.  An additional 5 mm port was placed in the right lower quadrant, and the coiled portion of the catheter was taken out through the port.  The fibrin clot was milked out and  then the catheter was flushed with saline.  No further clots noted and end of the catheter was placed back into the abdominal cavity and placed in the pelvis.  Then the connectors were placed and the catheter was tested for flow/drainage using heparinized saline.  It had good flow in but slugging about halfway out.  The abdomen was reinsufflated and there was no other clots within the tubing.  The same test was performed and again good flow in and sluggish out.  After lifting her bladder out of the pelvis, the flow improved significantly.  The 5 mm ports were removed and pneumoperitoneum released.  30 ml of local anesthetic was infiltrated onto the incisions.  The incisions were closed in layers using 3-0 Vicryl and 4-0 Monocryl.  The wounds were cleaned and sealed with DermaBond.  A Biopatch was placed at the catheter exit site and the catheter was dressed with 4x4 gauze and porous tape.  The patient was emerged from anesthesia and extubated and brought to the recovery room for further management.  The patient tolerated the procedure well and all counts were correct at the end of the case.   Melvyn Neth, MD

## 2022-11-24 NOTE — Anesthesia Preprocedure Evaluation (Addendum)
Anesthesia Evaluation  Patient identified by MRN, date of birth, ID band Patient awake    Reviewed: Allergy & Precautions, NPO status , Patient's Chart, lab work & pertinent test results  History of Anesthesia Complications Negative for: history of anesthetic complications  Airway Mallampati: II  TM Distance: >3 FB Neck ROM: Full    Dental  (+) Edentulous Upper, Poor Dentition   Pulmonary neg pulmonary ROS, neg sleep apnea, neg COPD, Patient abstained from smoking.Not current smoker   Pulmonary exam normal breath sounds clear to auscultation       Cardiovascular Exercise Tolerance: Good METShypertension, +CHF  (-) CAD and (-) Past MI (-) dysrhythmias  Rhythm:Regular Rate:Normal - Systolic murmurs    Neuro/Psych  Headaches PSYCHIATRIC DISORDERS  Depression       GI/Hepatic ,neg GERD  ,,(+)     (-) substance abuse    Endo/Other  diabetes, Type 1    Renal/GU ESRF and DialysisRenal diseasePartial dialysis yesterday. Feels well today from fluid status standpoint     Musculoskeletal   Abdominal   Peds  Hematology  (+) Blood dyscrasia, anemia   Anesthesia Other Findings Patient with a starting hemoglobin of 7.2. Ordered 1 prbc to be tranfused in pre op. Patient understood and agreed.   Past Medical History: No date: Anemia No date: Diabetes mellitus without complication (HCC)     Comment:  Type 1 DM No date: Essential hypertension No date: Headache 03/04/2013: Hypertension No date: Neurologic disorder     Comment:  Both feet No date: Recurrent UTI No date: Renal disorder  Reproductive/Obstetrics                             Anesthesia Physical Anesthesia Plan  ASA: 4  Anesthesia Plan: General ETT   Post-op Pain Management: Minimal or no pain anticipated   Induction: Intravenous  PONV Risk Score and Plan: 3 and Ondansetron, Midazolam and Dexamethasone  Airway Management Planned:  Oral ETT  Additional Equipment: None  Intra-op Plan:   Post-operative Plan: Extubation in OR  Informed Consent: I have reviewed the patients History and Physical, chart, labs and discussed the procedure including the risks, benefits and alternatives for the proposed anesthesia with the patient or authorized representative who has indicated his/her understanding and acceptance.     Dental Advisory Given  Plan Discussed with: Anesthesiologist, CRNA and Surgeon  Anesthesia Plan Comments: (Patient consented for risks of anesthesia including but not limited to:  - adverse reactions to medications - damage to eyes, teeth, lips or other oral mucosa - nerve damage due to positioning  - sore throat or hoarseness - Damage to heart, brain, nerves, lungs, other parts of body or loss of life  Patient voiced understanding.)        Anesthesia Quick Evaluation

## 2022-11-25 DIAGNOSIS — N186 End stage renal disease: Secondary | ICD-10-CM | POA: Diagnosis not present

## 2022-11-25 DIAGNOSIS — E1122 Type 2 diabetes mellitus with diabetic chronic kidney disease: Secondary | ICD-10-CM | POA: Diagnosis not present

## 2022-11-25 DIAGNOSIS — M8610 Other acute osteomyelitis, unspecified site: Secondary | ICD-10-CM | POA: Diagnosis not present

## 2022-11-25 DIAGNOSIS — E1169 Type 2 diabetes mellitus with other specified complication: Secondary | ICD-10-CM | POA: Diagnosis not present

## 2022-11-25 DIAGNOSIS — M869 Osteomyelitis, unspecified: Secondary | ICD-10-CM | POA: Diagnosis not present

## 2022-11-25 LAB — BPAM RBC
Blood Product Expiration Date: 202402132359
ISSUE DATE / TIME: 202401090942
Unit Type and Rh: 5100

## 2022-11-25 LAB — TYPE AND SCREEN
ABO/RH(D): O POS
Antibody Screen: NEGATIVE
Unit division: 0

## 2022-11-25 LAB — CULTURE, BLOOD (ROUTINE X 2)
Culture: NO GROWTH
Culture: NO GROWTH
Special Requests: ADEQUATE

## 2022-11-25 LAB — GLUCOSE, CAPILLARY
Glucose-Capillary: 108 mg/dL — ABNORMAL HIGH (ref 70–99)
Glucose-Capillary: 101 mg/dL — ABNORMAL HIGH (ref 70–99)
Glucose-Capillary: 174 mg/dL — ABNORMAL HIGH (ref 70–99)
Glucose-Capillary: 101 mg/dL — ABNORMAL HIGH (ref 70–99)
Glucose-Capillary: 134 mg/dL — ABNORMAL HIGH (ref 70–99)

## 2022-11-25 NOTE — Progress Notes (Signed)
PT was doing 4 cycles of PD.  On the drain of the 2nd cycle, the Machine had "Occluded line error." With 470 ml left in the Peritoneal  I had the pt try to do different positions to try drain the fluid with no luck.  I tried to do a manual drain with the pt and placed her supine position with a pillow on her lower back with no luck..  PT started feeling pain every time I open the transfer set to try to drain.  The pain would go away as soon as I close the transfer set.   I was unable to do 2 more cycles due to not draining and pain the pt feels.

## 2022-11-25 NOTE — Progress Notes (Signed)
   Date of Admission:  11/20/2022     ID: Emma Thomas is a 27 y.o. 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)   Vomiting   ESRD on dialysis ([place])   PD catheter dysfunction (HCC)   Hypokalemia   Anemia of chronic disease   Anasarca   Hyponatremia    Subjective: Pt says PD cath not working Has not had dialysis in many days  Medications:   amLODipine  10 mg Oral Daily   gentamicin cream  1 Application Topical Daily   heparin  5,000 Units Subcutaneous Q8H   insulin aspart  0-5 Units Subcutaneous QHS   insulin aspart  0-9 Units Subcutaneous TID WC   insulin aspart  3 Units Subcutaneous TID WC   insulin glargine-yfgn  6 Units Subcutaneous Q24H   losartan  100 mg Oral Daily   montelukast  10 mg Oral QHS   multivitamin with minerals  1 tablet Oral Daily   vancomycin variable dose per unstable renal function (pharmacist dosing)   Does not apply See admin instructions    Objective: Vital signs in last 24 hours: Temp:  [97.5 F (36.4 C)-98.6 F (37 C)] 98.6 F (37 C) (01/10 0754) Pulse Rate:  [89-102] 95 (01/10 0754) Resp:  [0-20] 18 (01/10 0754) BP: (121-145)/(74-95) 138/95 (01/10 0754) SpO2:  [88 %-100 %] 98 % (01/10 0754)  LDA PD cath  PHYSICAL EXAM:  General: Alert, cooperative, no distress, appears stated age.  Edema eyelids Neck: Supple, symmetrical, no adenopathy, thyroid: non tender no carotid bruit and no JVD. Back: No CVA tenderness. Lungs: Clear to auscultation bilaterally. No Wheezing or Rhonchi. No rales. Heart: Regular rate and rhythm, no murmur, rub or gallop. Abdomen: Soft, non-tender,not distended. Bowel sounds normal. No masses Extremities:    Left great toe amputation site looks healthy Skin: No rashes or lesions. Or bruising Lymph: Cervical, supraclavicular normal. Neurologic: Grossly non-focal  Lab Results Recent Labs    11/23/22 0330  11/24/22 0338  WBC  --  8.9  HGB  --  7.2*  HCT  --  23.2*  NA 135 134*  K 3.5 3.8  CL 102 104  CO2 21* 20*  BUN 42* 37*  CREATININE 7.12* 6.97*    Assessment/Plan:  ?Diabetic foot infection with left great toe wound and osteomyelitis- s/p amputation of toe Culture staph aureus- susceptibility pending- Currently on vanco/ceftriaxone and flagyl. Just continue vanco only Will wait bone pathology to decide whether Iv or Po   Malfunctioning PD cath- was revised, but still not working    DM- on insulin   ESRD on PD- had missed some sessions due to malfunctioning cath   [facility] - management as per primary team   Discussed the management with patient

## 2022-11-25 NOTE — TOC Initial Note (Signed)
Transition of Care [facility]) - Initial/Assessment Note    Patient Details  Name: Emma Thomas MRN: [id number] Date of Birth: 05/31/1996  [facility]) CM/SW Contact:    Beverly Sessions, RN Phone Number: 11/25/2022, 1:57 PM  Clinical Narrative:                  Admitted for: POD 2 Amputation left hallux with partial first ray resection.  POD1 revision of PD cath Elvera Bicker dialysis liaison aware of admission.    Admitted from: home with husband PCP: Patient states that her PCP is no longer with the practice, and they have not assigned her a new PCP No home DME:  patient request crutches at discharge.  Requested for MD to order crutches  Therapy recommending home health.  Patient in agreement and states that she does not have a preference of home health agency.  Not yet determined if patient will need IV antibiotics at home.  Patient and her husband are aware she may require home IV antibiotics at discharge.Marland Kitchen  Referral made and accepted by [place] with [facility]  for home health     Expected Discharge Plan: Home/Self Care Barriers to Discharge: Continued Medical Work up   Patient Goals and CMS Choice Patient states their goals for this hospitalization and ongoing recovery are:: home with spouse CMS Medicare.gov Compare Post Acute Care list provided to:: Patient Choice offered to / list presented to : Patient      Expected Discharge Plan and Services       Living arrangements for the past 2 months: Single Family Home                                      Prior Living Arrangements/Services Living arrangements for the past 2 months: Single Family Home Lives with:: Spouse Patient language and need for interpreter reviewed:: Yes        Need for Family Participation in Patient Care: Yes (Comment) Care giver support system in place?: Yes (comment)   Criminal Activity/Legal Involvement Pertinent to Current Situation/Hospitalization: No - Comment as  needed  Activities of Daily Living Home Assistive Devices/Equipment: External Monitoring Devices (Insulin pump) ADL Screening (condition at time of admission) Patient's cognitive ability adequate to safely complete daily activities?: Yes Is the patient deaf or have difficulty hearing?: No Does the patient have difficulty seeing, even when wearing glasses/contacts?: No Does the patient have difficulty concentrating, remembering, or making decisions?: No Patient able to express need for assistance with ADLs?: Yes Does the patient have difficulty dressing or bathing?: No Independently performs ADLs?: Yes (appropriate for developmental age) Communication: Independent Dressing (OT): Independent Grooming: Independent Feeding: Independent Bathing: Independent Toileting: Independent In/Out Bed: Independent Walks in Home: Independent Does the patient have difficulty walking or climbing stairs?: Yes Weakness of Legs: Left Weakness of Arms/Hands: Left (foot)  Permission Sought/Granted                  Emotional Assessment       Orientation: : Oriented to Self, Oriented to Situation, Oriented to Place, Oriented to  Time Alcohol / Substance Use: Not Applicable Psych Involvement: No (comment)  Admission diagnosis:  Hypokalemia [E87.6] Acute osteomyelitis ([place]) [M86.10] Osteomyelitis of left foot, unspecified type [facility]) [M86.9] Patient Active Problem List   Diagnosis Date Noted   Hyponatremia 11/22/2022   Anemia of chronic disease 11/21/2022   Anasarca 11/21/2022  Acute osteomyelitis ([place]) 11/20/2022   Hypokalemia 11/20/2022   PD catheter dysfunction ([place])    Umbilical hernia without obstruction and without gangrene    ESRD on dialysis ([place]) 04/22/2022   Vomiting 04/19/2022   Labor and delivery indication for care or intervention 02/13/2020   Pyelonephritis affecting pregnancy in first trimester 11/01/2019   Supervision of high risk pregnancy, antepartum 11/01/2019    Depression 10/27/2019   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) ([place]) 10/27/2019   History of chlamydia infection 10/27/2019   History of depression 10/27/2019   Vitamin D deficiency 10/27/2019   Liver function abnormality 08/15/2018   Microalbuminuric diabetic nephropathy ([place]) 08/15/2018   High anion gap metabolic acidosis    AKI (acute kidney injury) ([place])    Abdominal pain    Pyelonephritis 07/30/2016   DKA (diabetic ketoacidoses) 09/09/2015   History of gonorrhea 08/13/2015   Intrauterine device 05/03/2015   Neuropathy 04/13/2013   Weight loss, non-intentional 04/13/2013   Depression, major, recurrent, moderate ([place]) 03/06/2013   Hypertension 03/04/2013   PCP:  Margarita Rana, MD Pharmacy:   [id number] [address], [place] - [place] [address] Phone: [phone] Fax: [phone]     Social Determinants of Health (SDOH) Social History: SDOH Screenings   Food Insecurity: No Food Insecurity (11/21/2022)  Housing: Low Risk  (11/21/2022)  Transportation Needs: No Transportation Needs (11/21/2022)  Utilities: Not At Risk (11/21/2022)  Tobacco Use: Low Risk  (11/24/2022)   SDOH Interventions:     Readmission Risk Interventions    11/21/2022   11:46 AM 04/24/2022    1:30 PM  Readmission Risk Prevention Plan  Transportation Screening Complete Complete  PCP or Specialist Appt within 3-5 Days Complete Complete  HRI or Home Care Consult Complete Complete  Social Work Consult for [place] Planning/Counseling Complete Complete  Palliative Care Screening Not Applicable Not Applicable  Medication Review Press photographer) Complete Complete

## 2022-11-25 NOTE — Progress Notes (Signed)
[facility]  PROGRESS NOTE   Subjective:   Patient seen sitting up in bed, alert and oriented States pain not well-controlled from amputation operation. States her appetite is improving, denies nausea or vomiting  Receiving daytime peritoneal dialysis treatment, going well thus far.  Objective:  Vital signs: Blood pressure (!) 138/95, pulse 95, temperature 98.6 F (37 C), temperature source Oral, resp. rate 18, height 5\' 6"  (1.676 m), weight 68 kg, last menstrual period 11/11/2022, SpO2 98 %.  Intake/Output Summary (Last 24 hours) at 11/25/2022 1209 Last data filed at 11/24/2022 1840 Gross per 24 hour  Intake 150 ml  Output --  Net 150 ml    Filed Weights   11/23/22 0429 11/24/22 0327 11/24/22 0854  Weight: 78.5 kg 81 kg 68 kg     Physical Exam: General:  No acute distress  Head:  Normocephalic, atraumatic. Moist oral mucosal membranes  Eyes:  Anicteric  Lungs:   Clear to auscultation, normal effort  Heart:  S1S2 no rubs  Abdomen:   Soft, nontender, bowel sounds present, PDC  Extremities: Trace peripheral edema.  Neurologic:  Awake, alert, following commands  Skin:   left great toe surgical dressing  Access: PD catheter    Basic Metabolic Panel: Recent Labs  Lab 11/20/22 1949 11/21/22 0516 11/22/22 0406 11/22/22 1939 11/23/22 0330 11/24/22 0338  NA 133* 132* 133* 134* 135 134*  K 2.9* 3.0* 2.8* 3.0* 3.5 3.8  CL 96* 99 99 100 102 104  CO2 22 21* 21* 22 21* 20*  GLUCOSE 152* 140* 206* 106* 112* 110*  BUN 40* 41* 42* 43* 42* 37*  CREATININE 6.29* 6.45* 6.69* 7.33* 7.12* 6.97*  CALCIUM 7.4* 7.2* 7.7* 7.7* 7.7* 7.7*  MG 1.8  --  2.0  --   --   --   PHOS  --   --  4.7*  --   --   --     GFR: Estimated Creatinine Clearance: 11.2 mL/min (A) (by C-G formula based on SCr of 6.97 mg/dL (H)).  Liver Function Tests: Recent Labs  Lab 11/20/22 1107 11/21/22 0516  AST 74* 53*  ALT 63* 50*  ALKPHOS 79 67  BILITOT 0.9 0.8  PROT 7.8 6.4*  ALBUMIN  2.2* 1.8*    No results for input(s): "LIPASE", "AMYLASE" in the last 168 hours. No results for input(s): "AMMONIA" in the last 168 hours.  CBC: Recent Labs  Lab 11/20/22 1107 11/21/22 0516 11/22/22 0406 11/24/22 0338  WBC 14.7* 14.0*  --  8.9  NEUTROABS 11.0* 11.2*  --   --   HGB 8.6* 7.4* 7.9* 7.2*  HCT 27.6* 22.7*  --  23.2*  MCV 85.4 84.7  --  86.2  PLT 490* 411*  --  462*      HbA1C: Hgb A1c MFr Bld  Date/Time Value Ref Range Status  11/20/2022 07:49 PM 7.9 (H) 4.8 - 5.6 % Final    Comment:    (NOTE)         Prediabetes: 5.7 - 6.4         Diabetes: >6.4         Glycemic control for adults with diabetes: <7.0   04/19/2022 05:05 PM 9.4 (H) 4.8 - 5.6 % Final    Comment:    (NOTE) Pre diabetes:          5.7%-6.4%  Diabetes:              >6.4%  Glycemic control for   <7.0% adults with  diabetes     Urinalysis: No results for input(s): "COLORURINE", "LABSPEC", "PHURINE", "GLUCOSEU", "HGBUR", "BILIRUBINUR", "KETONESUR", "PROTEINUR", "UROBILINOGEN", "NITRITE", "LEUKOCYTESUR" in the last 72 hours.  Invalid input(s): "APPERANCEUR"    Imaging: No results found.   Medications:    cefTRIAXone (ROCEPHIN)  IV Stopped (11/23/22 1633)   dialysis solution 1.5% low-MG/low-CA     metronidazole 500 mg (11/25/22 0052)    amLODipine  10 mg Oral Daily   gentamicin cream  1 Application Topical Daily   heparin  5,000 Units Subcutaneous Q8H   insulin aspart  0-5 Units Subcutaneous QHS   insulin aspart  0-9 Units Subcutaneous TID WC   insulin aspart  3 Units Subcutaneous TID WC   insulin glargine-yfgn  6 Units Subcutaneous Q24H   losartan  100 mg Oral Daily   montelukast  10 mg Oral QHS   multivitamin with minerals  1 tablet Oral Daily   vancomycin variable dose per unstable renal function (pharmacist dosing)   Does not apply See admin instructions    Assessment/ Plan:     27 year old female with history of type 1 diabetes, end-stage renal disease on peritoneal  dialysis, hypertension now admitted with history of left toe ulceration.  She had an MRI showing osteomyelitis.  She was found to have hypokalemia and had multiple potassium supplements.   ESRD on peritoneal dialysis: Patient has PD catheter malfunction.  Appreciate surgery evaluation and revision on 11/24/2022.  Instructed to allow [place] to rest overnight.  Receiving daytime treatment.  We will continue to use heparinized dialysate with treatments.   ANEMIA with chronic kidney disease: Hemoglobin remains decreased.  Patient does receive monthly Mircera at outpatient clinic.   Secondary Hyperparathyroidism: with outpatient labs: PTH 291, phosphorus 4.4, calcium 7.7 on 11/19/22.   Lab Results  Component Value Date   CALCIUM 7.7 (L) 11/24/2022   CAION 0.89 (LL) 07/16/2022   PHOS 4.7 (H) 11/22/2022  Calcium below desired range however phosphorus at target.  Will continue to monitor.  Should improve with increased oral intake.   HTN/VOL with chronic kidney disease: Continue losartan and amlodipine.  Blood pressure remains acceptable for this patient.   ACCESS: PD catheter has not been working well.  Surgery revision completed on 11/24/2022.Surgical note states fibrin clot found inside peritoneal dialysis catheter along with catheter outlet blocked by full bladder.  Patient encouraged to void prior to peritoneal dialysis treatments.    Hypokalemia: Corrected     Acute osteomyelitis: Continue antibiotics with Rocephin, vancomycin and metronidazole. Surgery performed left great toe amputation on 11/24/2022.  Pain management and therapy sessions underway.  Diabetes mellitus, type 2 with chronic kidney disease: Continue insulin doses as ordered.   LOS: [facility] 1/10/202412:09 PM

## 2022-11-25 NOTE — Progress Notes (Signed)
Progress Note   Patient: Emma Thomas FUX:[id number] DOB: 02-15-1996 DOA: 11/20/2022     5 DOS: the patient was seen and examined on 11/25/2022   Brief hospital course: 27 year old female with history of type 1 diabetes, end-stage renal disease on peritoneal dialysis, hypertension and anemia presents with left first toe being swollen and found to have osteomyelitis. 1/7.  Surgery canceled today secondary to hypokalemia.  Replacing potassium orally.  Patient also stated that her peritoneal dialysis has not been working well even at home. 1/8.  Dr. Cleda Mccreedy podiatry did a amputation of the left hallux with partial first ray resection and incision and drainage of abscess left forefoot.  Patient had nausea after procedure and given a dose of Phenergan. 1/9.  Dr. Hampton Abbot took to the operating room for laparoscopic peritoneal dialysis revision with removal of fibrin clot.  Patient had nausea after surgery and given a dose of Phenergan 1/10.  No events overnight.  General surgery cleared the patient for discharge.  Currently awaiting wound cultures from debridement for antibiotic selection, infectious disease following.  Assessment and Plan: * Acute osteomyelitis ([place]) Osteomyelitis left first toe.  Podiatry did amputation of the left first hallux and partial ray amputation with incision and drainage of the abscess on 1/8.  Pharmacy to dose vancomycin.  On IV Flagyl and ceftriaxone.  Podiatry recommended ID consultation for possible need of IV antibiotics.  Cultures from wound pending.  Vomiting After both surgical procedures and a dose of Phenergan needed be given.  No more recurrent episodes of vomiting  Hypokalemia Holding water pills.  Replaced aggressively prior to surgical procedures.  Potassium level 3.8 today  PD catheter dysfunction ([facility]) Dr. Hampton Abbot did a peritoneal catheter revision with fibrin clot removal.  He recommended emptying bladder prior to doing treatments.  Type 1 diabetes  mellitus with end-stage renal disease (ESRD) ([place]) Usually wears insulin pump at home.  Since patient had vomiting will continue low-dose Semglee insulin to 6 units tonight and use short acting insulin prior to meals.  ESRD on dialysis [facility]) Nephrology to start peritoneal dialysis tomorrow.  Hyponatremia Sodium 1 point less than normal range.  Anasarca Swelling bilateral lower extremities likely secondary to low albumin and end-stage renal disease.  Anemia of chronic disease Last hemoglobin 7.2.        Subjective: Patient seen and examined at bedside today.  Patient has no new complaints.  Is having any nausea vomiting reports pain is fairly well-controlled on the left lower extremity.   Physical Exam: Vitals:   11/24/22 1400 11/24/22 2118 11/24/22 2131 11/25/22 0754  BP: 130/83 131/87  (!) 138/95  Pulse: (!) 102 100  95  Resp: 16  20 18   Temp: 98.3 F (36.8 C)  97.9 F (36.6 C) 98.6 F (37 C)  TempSrc: Oral  Oral Oral  SpO2: 100% 100%  98%  Weight:      Height:       Physical Exam HENT:     Head: Normocephalic.     Mouth/Throat:     Pharynx: No oropharyngeal exudate.  Eyes:     General: Lids are normal.     Conjunctiva/sclera: Conjunctivae normal.  Cardiovascular:     Rate and Rhythm: Normal rate and regular rhythm.     Heart sounds: Normal heart sounds, S1 normal and S2 normal.  Pulmonary:     Breath sounds: No decreased breath sounds, wheezing, rhonchi or rales.  Abdominal:     Palpations: Abdomen is soft.  Tenderness: There is no abdominal tenderness.  Musculoskeletal:     Right lower leg: Swelling present.     Left lower leg: Swelling present.  Skin:    General: Skin is warm.     Comments: Status post left first toe amputation and an pressure dressing  Neurological:     Mental Status: She is alert and oriented to person, place, and time.     Data Reviewed: Sodium 134, creatinine 6.97, hemoglobin 7.2, white blood cell count 8.9, platelet count  462  Disposition: Status is: Inpatient Remains inpatient appropriate because: Podiatry recommended ID consultation for potential of IV antibiotics.  Nephrology would like to use the peritoneal dialysis catheter starting tomorrow.  Planned Discharge Destination: Home    Time spent: 27 minutes  Author: Carlyle Lipa, MD 11/25/2022 2:18 PM  For on call review [email or web address].

## 2022-11-25 NOTE — Consult Note (Signed)
NAME: Emma Thomas  DOB: Apr 06, 1996  MRN: [id number]  Date/Time:11/24/22 -5.30PM  REQUESTING PROVIDER: Dr.Wieting Subjective:  REASON FOR CONSULT: rt great toe infection ?Husband at bed side Emma Thomas is a 27 y.o.female  with a history of Type 1 DM, ESRD on PD , HTN presents with infection of the left  great toe- As per patient the toe nail fell off many weeks ago. She has bene picking on the toe and a week ago thetoe became swollen and painful and she went to see the podiatrist who asked her to go to ED. She came to ED on 11/20/22 Pt had also missed dialysis for a week due to malfunctioning PD catheter In the ED vitals were  11/20/22  BP 119/82  Temp 98.6 F (37 C)  Pulse Rate 102 !  Resp 20  SpO2 100 %    Latest Reference Range & Units 11/20/22  WBC 4.0 - 10.5 K/uL 14.7 (H)  Hemoglobin 12.0 - 15.0 g/dL 8.6 (L)  HCT 36.0 - 46.0 % 27.6 (L)  Platelets 150 - 400 K/uL 490 (H)  Creatinine 0.44 - 1.00 mg/dL 6.29 (H)  Blood culture sent and started on broad spectrum antibiotic MRI showed active osteo of the great toe distal phalanx and possible septic joint of the first MTP HE was seen by podiatrist and underwent great toe ray excision on 11/23/22 Also underwent laparoscopic revision of PD catheter and removal of fibrin clot today I am asked to see him for managing the left great toe infeciton  Past Medical History:  Diagnosis Date   Anemia    Diabetes mellitus without complication ([place])    Type 1 DM   Essential hypertension    Headache    Hypertension 03/04/2013   Neurologic disorder    Both feet   Recurrent UTI    Renal disorder     Past Surgical History:  Procedure Laterality Date   abscess removal     excision of bartholin cyst   AMPUTATION Left 11/23/2022   Procedure: LEFT GREAT TOE PARTIAL RAY AMPUTATION;  Surgeon: Sharlotte Alamo, DPM;  Location: [facility];  Service: Podiatry;  Laterality: Left;   CAPD INSERTION N/A 06/09/2022   Procedure: LAPAROSCOPIC INSERTION  CONTINUOUS AMBULATORY PERITONEAL DIALYSIS  (CAPD) CATHETER, PD rep to be present;  Surgeon: Olean Ree, MD;  Location: [facility];  Service: General;  Laterality: N/A;   CAPD REVISION N/A 07/16/2022   Procedure: LAPAROSCOPIC REVISION CONTINUOUS AMBULATORY PERITONEAL DIALYSIS  (CAPD) CATHETER;  Surgeon: Olean Ree, MD;  Location: [facility];  Service: General;  Laterality: N/A;   CAPD REVISION N/A 11/24/2022   Procedure: LAPAROSCOPIC REVISION CONTINUOUS AMBULATORY PERITONEAL DIALYSIS  (CAPD) CATHETER;  Surgeon: Olean Ree, MD;  Location: [facility];  Service: General;  Laterality: N/A;   DIALYSIS/PERMA CATHETER INSERTION N/A 04/24/2022   Procedure: DIALYSIS/PERMA CATHETER INSERTION;  Surgeon: Katha Cabal, MD;  Location: [facility];  Service: Cardiovascular;  Laterality: N/A;   DIALYSIS/PERMA CATHETER REMOVAL N/A 06/02/2022   Procedure: DIALYSIS/PERMA CATHETER REMOVAL;  Surgeon: Katha Cabal, MD;  Location: [facility];  Service: Cardiovascular;  Laterality: N/A;   INSERTION OF DIALYSIS CATHETER Right    ARM   Nexplanon  [id number]   UMBILICAL HERNIA REPAIR N/A 06/09/2022   Procedure: HERNIA REPAIR UMBILICAL ADULT;  Surgeon: Olean Ree, MD;  Location: [facility];  Service: General;  Laterality: N/A;    Social History   Socioeconomic History   Marital status: Married    Spouse  name: Cameo   Number of children: 1   Years of education: Not on file   Highest education level: Not on file  Occupational History   Not on file  Tobacco Use   Smoking status: Never    Passive exposure: Past   Smokeless tobacco: Never  Vaping Use   Vaping Use: Never used  Substance and Sexual Activity   Alcohol use: Not Currently    Alcohol/week: 2.0 standard drinks of alcohol    Types: 2 Shots of liquor per week   Drug use: No   Sexual activity: Yes    Birth control/protection: None  Other Topics Concern   Not on file  Social History Narrative   Not on file   Social  Determinants of Health   Financial Resource Strain: Not on file  Food Insecurity: No Food Insecurity (11/21/2022)   Hunger Vital Sign    Worried About Running Out of Food in the Last Year: Never true    Ran Out of Food in the Last Year: Never true  Transportation Needs: No Transportation Needs (11/21/2022)   PRAPARE - Hydrologist (Medical): No    Lack of Transportation (Non-Medical): No  Physical Activity: Not on file  Stress: Not on file  Social Connections: Not on file  Intimate Partner Violence: Not At Risk (11/21/2022)   Humiliation, Afraid, Rape, and Kick questionnaire    Fear of Current or Ex-Partner: No    Emotionally Abused: No    Physically Abused: No    Sexually Abused: No    Family History  Problem Relation Age of Onset   Breast cancer Mother 1   Lung cancer Maternal Grandmother    Diabetes type II Paternal Grandmother    No Known Allergies I? Current Facility-Administered Medications  Medication Dose Route Frequency Provider Last Rate Last Admin   acetaminophen (TYLENOL) tablet 650 mg  650 mg Oral Q6H PRN Piscoya, Jose, MD   650 mg at 11/21/22 2041   Or   acetaminophen (TYLENOL) suppository 650 mg  650 mg Rectal Q6H PRN Piscoya, Jose, MD       amLODipine (NORVASC) tablet 10 mg  10 mg Oral Daily Piscoya, Jose, MD   10 mg at 11/22/22 0835   cefTRIAXone (ROCEPHIN) 2 g in sodium chloride 0.9 % 100 mL IVPB  2 g Intravenous Q24H Olean Ree, MD   Stopped at 11/23/22 1633   dialysis solution 1.5% low-MG/low-CA dianeal solution   Intraperitoneal Q24H Breeze, Benancio Deeds, NP       gentamicin cream (GARAMYCIN) 0.1 % 1 Application  1 Application Topical Daily Piscoya, Jose, MD   1 Application at 05/09/75 0853   heparin injection 5,000 Units  5,000 Units Subcutaneous Q8H Piscoya, Jose, MD       HYDROmorphone (DILAUDID) injection 0.5 mg  0.5 mg Intravenous Q4H PRN Piscoya, Jose, MD   0.5 mg at 11/24/22 1704   insulin aspart (novoLOG) injection 0-5 Units   0-5 Units Subcutaneous QHS Piscoya, Jose, MD       insulin aspart (novoLOG) injection 0-9 Units  0-9 Units Subcutaneous TID WC Piscoya, Jose, MD   1 Units at 11/24/22 1703   insulin aspart (novoLOG) injection 3 Units  3 Units Subcutaneous TID WC Olean Ree, MD   3 Units at 11/22/22 1338   insulin glargine-yfgn (SEMGLEE) injection 6 Units  6 Units Subcutaneous Q24H Olean Ree, MD   6 Units at 11/24/22 2127   losartan (COZAAR) tablet 100 mg  100 mg  Oral Daily Olean Ree, MD   100 mg at 11/22/22 2841   metroNIDAZOLE (FLAGYL) IVPB 500 mg  500 mg Intravenous Q12H Piscoya, Jacqulyn Bath, MD   Stopped at 11/24/22 0525   montelukast (SINGULAIR) tablet 10 mg  10 mg Oral QHS Piscoya, Jose, MD   10 mg at 11/24/22 2104   multivitamin with minerals tablet 1 tablet  1 tablet Oral Daily Olean Ree, MD   1 tablet at 11/22/22 0834   ondansetron (ZOFRAN) tablet 4 mg  4 mg Oral Q6H PRN Olean Ree, MD       Or   ondansetron (ZOFRAN) injection 4 mg  4 mg Intravenous Q6H PRN Piscoya, Jose, MD   4 mg at 11/24/22 1358   oxyCODONE (Oxy IR/ROXICODONE) immediate release tablet 5 mg  5 mg Oral Q4H PRN Olean Ree, MD   5 mg at 11/24/22 2104   polyethylene glycol (MIRALAX / GLYCOLAX) packet 17 g  17 g Oral Daily PRN Piscoya, Jose, MD       vancomycin variable dose per unstable renal function (pharmacist dosing)   Does not apply See admin instructions Olean Ree, MD         Abtx:  Anti-infectives (From admission, onward)    Start     Dose/Rate Route Frequency Ordered Stop   11/24/22 1000  ceFAZolin (ANCEF) IVPB 2g/100 mL premix        2 g 200 mL/hr over 30 Minutes Intravenous  Once 11/24/22 0958 11/24/22 1015   11/24/22 0955  ceFAZolin (ANCEF) 2-4 GM/100ML-% IVPB       Note to Pharmacy: Register, Santiago Glad A: cabinet override      11/24/22 0955 11/24/22 1027   11/23/22 1318  vancomycin (VANCOCIN) powder  Status:  Discontinued          As needed 11/23/22 1319 11/23/22 1323   11/23/22 1100  vancomycin  (VANCOREADY) IVPB 1250 mg/250 mL        1,250 mg 166.7 mL/hr over 90 Minutes Intravenous  Once 11/23/22 0836 11/23/22 1817   11/22/22 1359  vancomycin variable dose per unstable renal function (pharmacist dosing)         Does not apply See admin instructions 11/22/22 1359     11/20/22 1400  vancomycin (VANCOREADY) IVPB 1250 mg/250 mL        1,250 mg 166.7 mL/hr over 90 Minutes Intravenous  Once 11/20/22 1325 11/20/22 2126   11/20/22 1330  cefTRIAXone (ROCEPHIN) 2 g in sodium chloride 0.9 % 100 mL IVPB        2 g 200 mL/hr over 30 Minutes Intravenous Every 24 hours 11/20/22 1315 11/27/22 1314   11/20/22 1330  metroNIDAZOLE (FLAGYL) IVPB 500 mg        500 mg 100 mL/hr over 60 Minutes Intravenous Every 12 hours 11/20/22 1315 11/27/22 1314       REVIEW OF SYSTEMS:  Const: negative fever, negative chills, negative weight loss Eyes: negative diplopia or visual changes, negative eye pain ENT: negative coryza, negative sore throat Resp: negative cough, hemoptysis, dyspnea Cards: negative for chest pain, palpitations, has lower extremity edema GU: negative for frequency, dysuria and hematuria GI: Negative for abdominal pain, diarrhea, bleeding, constipation Skin: negative for rash and pruritus Heme: negative for easy bruising and gum/nose bleeding MS: general weakness Neurolo:negative for headaches, dizziness, vertigo, memory problems  Psych: negative for feelings of anxiety, depression  Endocrine: , diabetes Allergy/Immunology- negative for any medication or food allergies ?  Objective:  VITALS:  BP 131/87 (BP Location: Left  Arm)   Pulse 100   Temp 97.9 F (36.6 C) (Oral)   Resp 20   Ht 5\' 6"  (1.676 m)   Wt 68 kg   LMP 11/11/2022 (Exact Date)   SpO2 100%   BMI 24.21 kg/m   PHYSICAL EXAM:  General: appears sleepy but responds to all questions appropriately, cooperative, no distress, appears stated age.  Head: Normocephalic, without obvious abnormality, atraumatic. Eyes:  Conjunctivae clear, anicteric sclerae. Pupils are equal Eye lids are puffy ENT Nares normal. No drainage or sinus tenderness. Lips lower lip puffy, mucosa, and tongue normal. No Thrush Neck: Supple, symmetrical, no adenopathy, thyroid: non tender no carotid bruit and no JVD. Back: No CVA tenderness. Lungs: b/l air entry Heart: Regular rate and rhythm, no murmur, rub or gallop. Abdomen: Soft, non-tender,not distended. Bowel sounds normal. No masses-PD cat Extremities: left foot in dressing      Amputated site looks clean and well coapted Skin: No rashes or lesions. Or bruising Lymph: Cervical, supraclavicular normal. Neurologic: Grossly non-focal Pertinent Labs Lab Results CBC    Component Value Date/Time   WBC 8.9 11/24/2022 0338   RBC 2.69 (L) 11/24/2022 0338   HGB 7.2 (L) 11/24/2022 0338   HGB 8.7 (L) 10/25/2019 1022   HCT 23.2 (L) 11/24/2022 0338   HCT 26.0 (L) 10/25/2019 1022   PLT 462 (H) 11/24/2022 0338   PLT 358 10/25/2019 1022   MCV 86.2 11/24/2022 0338   MCV 89 10/25/2019 1022   MCH 26.8 11/24/2022 0338   MCHC 31.0 11/24/2022 0338   RDW 12.0 11/24/2022 0338   RDW 11.8 10/25/2019 1022   LYMPHSABS 1.7 11/21/2022 0516   MONOABS 0.7 11/21/2022 0516   EOSABS 0.3 11/21/2022 0516   BASOSABS 0.1 11/21/2022 0516       Latest Ref Rng & Units 11/24/2022    3:38 AM 11/23/2022    3:30 AM 11/22/2022    7:39 PM  CMP  Glucose 70 - 99 mg/dL 110  112  106   BUN 6 - 20 mg/dL 37  42  43   Creatinine 0.44 - 1.00 mg/dL 6.97  7.12  7.33   Sodium 135 - 145 mmol/L 134  135  134   Potassium 3.5 - 5.1 mmol/L 3.8  3.5  3.0   Chloride 98 - 111 mmol/L 104  102  100   CO2 22 - 32 mmol/L 20  21  22    Calcium 8.9 - 10.3 mg/dL 7.7  7.7  7.7       Microbiology: Recent Results (from the past 240 hour(s))  Blood Cultures x 2 sites     Status: None (Preliminary result)   Collection Time: 11/20/22  4:38 PM   Specimen: BLOOD LEFT HAND  Result Value Ref Range Status   Specimen  Description BLOOD LEFT HAND  Final   Special Requests   Final    BOTTLES DRAWN AEROBIC AND ANAEROBIC Blood Culture results may not be optimal due to an inadequate volume of blood received in culture bottles   Culture   Final    NO GROWTH 4 DAYS Performed at [facility], [address] [place],  [address]    Report Status PENDING  Incomplete  Blood Cultures x 2 sites     Status: None (Preliminary result)   Collection Time: 11/20/22  5:15 PM   Specimen: BLOOD LEFT FOREARM  Result Value Ref Range Status   Specimen Description BLOOD LEFT FOREARM  Final   Special Requests   Final  BOTTLES DRAWN AEROBIC AND ANAEROBIC Blood Culture adequate volume   Culture   Final    NO GROWTH 4 DAYS Performed at [facility], [address] [place], [place] [address]    Report Status PENDING  Incomplete  Surgical pcr screen     Status: None   Collection Time: 11/23/22 11:08 AM   Specimen: Nasal Mucosa; Nasal Swab  Result Value Ref Range Status   MRSA, PCR NEGATIVE NEGATIVE Final   Staphylococcus aureus NEGATIVE NEGATIVE Final    Comment: (NOTE) The Xpert SA Assay (FDA approved for NASAL specimens in patients 85 years of age and older), is one component of a comprehensive surveillance program. It is not intended to diagnose infection nor to guide or monitor treatment. Performed at [facility], [address] [place], [place] [address]   Aerobic/Anaerobic Culture w Gram Stain (surgical/deep wound)     Status: None (Preliminary result)   Collection Time: 11/23/22  1:03 PM   Specimen: Wound; Abscess  Result Value Ref Range Status   Specimen Description   Final    TISSUE Performed at [facility], [address] [place], [place] [address]    Special Requests   Final    LEFT FOOT Performed at [facility], [place], [place] [address]    Gram Stain NO WBC SEEN NO ORGANISMS SEEN   Final   Culture   Final    NO GROWTH  < 24 HOURS Performed at [facility], [place] [address], [place], [place] [address]    Report Status PENDING  Incomplete  Aerobic/Anaerobic Culture w Gram Stain (surgical/deep wound)     Status: None (Preliminary result)   Collection Time: 11/23/22  1:03 PM   Specimen: Foot, Left; Tissue  Result Value Ref Range Status   Specimen Description   Final    ABSCESS Performed at [facility], [address], [place], [place] [address]    Special Requests   Final    LEFT FOOT Performed at [facility], [address] [place], [place] [address]    Gram Stain NO WBC SEEN RARE GRAM POSITIVE COCCI IN PAIRS   Final   Culture   Final    TOO YOUNG TO READ Performed at [facility], [place] [address], [place],  [address]    Report Status PENDING  Incomplete    IMAGING RESULTS: MRI reviewed Left great toe osteomyelitis I have personally reviewed the films ? Impression/Recommendation ?Diabetic foot infection with left great toe wound and osteomyelitis- s/p amputation of toe Culture pending Currently on vanco/ceftriaxone and flagyl  Malfunctioning PD cath- was revised   DM- on insulin  ESRD on PD- had missed some sessions due to malfunctioning cath  [facility] - management as per primary team  Discussed the management with patient and her spouse at bed side   ?

## 2022-11-25 NOTE — Progress Notes (Signed)
PT Cancellation Note  Patient Details Name: Emma Thomas MRN: [id number] DOB: Oct 03, 1996   Cancelled Treatment:    Reason Eval/Treat Not Completed: Other (comment): Pt currently receiving PD, will attempt to see pt at a future date/time as medically appropriate.     Linus Salmons PT, DPT 11/25/22, 10:07 AM

## 2022-11-25 NOTE — Progress Notes (Signed)
[facility] Day(s): 5.   Post op day(s): 1 Day Post-Op.   Interval History:  Patient seen and examined No acute events or new complaints overnight.  Patient with incisional soreness No fever, chills, nausea, emesis No new labs this AM  Vital signs in last 24 hours: [min-max] current  Temp:  [97.5 F (36.4 C)-98.6 F (37 C)] 97.9 F (36.6 C) (01/09 2131) Pulse Rate:  [89-102] 100 (01/09 2118) Resp:  [0-20] 20 (01/09 2131) BP: (121-145)/(74-91) 131/87 (01/09 2118) SpO2:  [88 %-100 %] 100 % (01/09 2118) Weight:  [68 kg] 68 kg (01/09 0854)     Height: 5\' 6"  (167.6 cm) Weight: 68 kg BMI (Calculated): 24.22   Intake/Output last 2 shifts:  01/09 0701 - 01/10 0700 In: 1000 [I.V.:400; Blood:350; IV Piggyback:250] Out: -    Physical Exam:  Constitutional: alert, cooperative and no distress  Respiratory: breathing non-labored at rest  Cardiovascular: regular rate and sinus rhythm  Gastrointestinal: soft, incisional soreness, and non-distended. No rebound/guarding. PD catheter site in right abdomen; insertion site is w/o erythema or drainage.  Integumentary: Laparoscopic incisions are CDI with dermabond; no erythema or drainage   Labs:     Latest Ref Rng & Units 11/24/2022    3:38 AM 11/22/2022    4:06 AM 11/21/2022    5:16 AM  CBC  WBC 4.0 - 10.5 K/uL 8.9   14.0   Hemoglobin 12.0 - 15.0 g/dL 7.2  7.9  7.4   Hematocrit 36.0 - 46.0 % 23.2   22.7   Platelets 150 - 400 K/uL 462   411       Latest Ref Rng & Units 11/24/2022    3:38 AM 11/23/2022    3:30 AM 11/22/2022    7:39 PM  CMP  Glucose 70 - 99 mg/dL 110  112  106   BUN 6 - 20 mg/dL 37  42  43   Creatinine 0.44 - 1.00 mg/dL 6.97  7.12  7.33   Sodium 135 - 145 mmol/L 134  135  134   Potassium 3.5 - 5.1 mmol/L 3.8  3.5  3.0   Chloride 98 - 111 mmol/L 104  102  100   CO2 22 - 32 mmol/L 20  21  22    Calcium 8.9 - 10.3 mg/dL 7.7  7.7  7.7      Imaging studies: No new pertinent  imaging studies   Assessment/Plan:  27 y.o. female 1 Day Post-Op s/p laparoscopic revision of PD catheter.   - Okay to trial PD again today - Monitor abdominal examination - Pain control prn   - Okay to mobilize   - Further management per primary service    - General surgery will sign off; She can follow up in ~2 weeks for post-op check. We will remain available as needed.   All of the above findings and recommendations were discussed with the patient, and the medical team, and all of patient's questions were answered to her expressed satisfaction.  -- Edison Simon, PA-C [facility] 11/25/2022, 7:30 AM M-F: 7am - 4pm

## 2022-11-25 NOTE — Progress Notes (Signed)
Physical Therapy Treatment Patient Details Name: Emma Thomas MRN: [id number] DOB: 1996/09/03 Today's Date: 11/25/2022   History of Present Illness Pt is a 27 y.o. female with medical history significant of type 1 diabetes, ESRD on PD, and hypertension, who presents to the ED due to ulceration of the left toe.  Pt diagnosed with osteomyelitis of the left hallux and first metatarsal and is s/p amputation of the left hallux with partial first ray resection and I&D of an abscess of the left forefoot. Additionally pt s/p laparoscopic peritoneal dialysis revision with removal of fibrin clot.    PT Comments    Pt was pleasant and motivated to participate during the session and put forth good effort throughout. Pt steady with transfer training from various surfaces with crutches with multi-modal cuing for proper sequencing.  Pt was able to amb with RLE swing-to pattern 3 x 20 feet with cuing for sequencing with good stability with LLE NWB maintained throughout.  Pt was able to ascend and descend 4 steps x 2 with step-to pattern using one rail and one crutch with multi-modal cuing for sequencing with good control and stability and with only minimal L heel WB with wedge shoe donned.  Offered knee scooter trial with pt declining secondary to thick carpet throughout home.  Pt reported feeling more comfortable using crutches compared to the RW with SW notified.  Pt declined BSC.  Pt will benefit from HHPT upon discharge to safely address deficits listed in patient problem list for decreased caregiver assistance and eventual return to PLOF.     Recommendations for follow up therapy are one component of a multi-disciplinary discharge planning process, led by the attending physician.  Recommendations may be updated based on patient status, additional functional criteria and insurance authorization.  Follow Up Recommendations  Home health PT     Assistance Recommended at Discharge Intermittent  Supervision/Assistance  Patient can return home with the following A little help with walking and/or transfers;A little help with bathing/dressing/bathroom;Assistance with cooking/housework;Assist for transportation;Help with stairs or ramp for entrance   Equipment Recommendations  Crutches    Recommendations for Other Services       Precautions / Restrictions Precautions Precautions: Fall Restrictions Weight Bearing Restrictions: Yes LLE Weight Bearing: Non weight bearing Other Position/Activity Restrictions: Patient may be minimal weightbearing in her wedge surgical shoe on the left foot with pressure only on the heel using a walker or crutches only for bathroom access, otherwise NWB     Mobility  Bed Mobility Overal bed mobility: Independent             General bed mobility comments: Good speed and effort with bed mobility tasks    Transfers Overall transfer level: Needs assistance Equipment used: Crutches Transfers: Sit to/from Stand Sit to Stand: Supervision           General transfer comment: Mod multi-modal cues for sequencing with crutches    Ambulation/Gait Ambulation/Gait assistance: Min guard Gait Distance (Feet): 20 Feet x 3 Assistive device: Rolling walker (2 wheels) Gait Pattern/deviations: Step-to pattern Gait velocity: decreased     General Gait Details: Pt able to amb with RLE swing-to pattern 3 x 20 feet with cuing for sequencing with good stability with LLE NWB maintained throughout   Stairs Stairs: Yes Stairs assistance: Min guard Stair Management: One rail Left, With crutches Number of Stairs: 4 General stair comments: Pt able to ascend/descend 4 steps x 2 with one rail and one crutch with step-to pattern with minimal WB  to the L heel only; mod verbal and visual cuing for proper sequencing but steady with good control throughout   Wheelchair Mobility    Modified Rankin (Stroke Patients Only)       Balance Overall balance  assessment: No apparent balance deficits (not formally assessed)                                          Cognition Arousal/Alertness: Awake/alert Behavior During Therapy: WFL for tasks assessed/performed Overall Cognitive Status: Within Functional Limits for tasks assessed                                          Exercises Other Exercises Other Exercises: Sit to/from stand transfer training with crutches from various surfaces    General Comments        Pertinent Vitals/Pain Pain Assessment Pain Assessment: 0-10 Pain Score: 6  Pain Location: L foot Pain Descriptors / Indicators: Aching Pain Intervention(s): Repositioned, Premedicated before session, Monitored during session    Home Living                          Prior Function            PT Goals (current goals can now be found in the care plan section) Progress towards PT goals: Progressing toward goals    Frequency    7X/week      PT Plan Current plan remains appropriate    Co-evaluation              AM-PAC PT "6 Clicks" Mobility   Outcome Measure  Help needed turning from your back to your side while in a flat bed without using bedrails?: None Help needed moving from lying on your back to sitting on the side of a flat bed without using bedrails?: None Help needed moving to and from a bed to a chair (including a wheelchair)?: A Little Help needed standing up from a chair using your arms (e.g., wheelchair or bedside chair)?: A Little Help needed to walk in hospital room?: A Little Help needed climbing 3-5 steps with a railing? : A Little 6 Click Score: 20    End of Session Equipment Utilized During Treatment: Gait belt Activity Tolerance: Patient tolerated treatment well Patient left: in bed;with call bell/phone within reach Nurse Communication: Mobility status;Weight bearing status PT Visit Diagnosis: Difficulty in walking, not elsewhere classified  (R26.2);Pain Pain - Right/Left: Left Pain - part of body: Ankle and joints of foot     Time: 2831-5176 PT Time Calculation (min) (ACUTE ONLY): 40 min  Charges:  $Gait Training: 23-37 mins $Therapeutic Activity: 8-22 mins                    D. Scott Bobie Caris PT, DPT 11/25/22, 5:33 PM

## 2022-11-26 DIAGNOSIS — M869 Osteomyelitis, unspecified: Secondary | ICD-10-CM | POA: Diagnosis not present

## 2022-11-26 DIAGNOSIS — E1169 Type 2 diabetes mellitus with other specified complication: Secondary | ICD-10-CM | POA: Diagnosis not present

## 2022-11-26 DIAGNOSIS — M8610 Other acute osteomyelitis, unspecified site: Secondary | ICD-10-CM | POA: Diagnosis not present

## 2022-11-26 LAB — CBC
HCT: 29.4 % — ABNORMAL LOW (ref 36.0–46.0)
Hemoglobin: 9.3 g/dL — ABNORMAL LOW (ref 12.0–15.0)
MCH: 27.8 pg (ref 26.0–34.0)
MCHC: 31.6 g/dL (ref 30.0–36.0)
MCV: 87.8 fL (ref 80.0–100.0)
Platelets: 453 10*3/uL — ABNORMAL HIGH (ref 150–400)
RBC: 3.35 MIL/uL — ABNORMAL LOW (ref 3.87–5.11)
RDW: 12.6 % (ref 11.5–15.5)
WBC: 6.8 10*3/uL (ref 4.0–10.5)
nRBC: 0 % (ref 0.0–0.2)

## 2022-11-26 LAB — VANCOMYCIN, RANDOM: Vancomycin Rm: 21 ug/mL

## 2022-11-26 LAB — RENAL FUNCTION PANEL
Albumin: 1.9 g/dL — ABNORMAL LOW (ref 3.5–5.0)
Anion gap: 11 (ref 5–15)
BUN: 28 mg/dL — ABNORMAL HIGH (ref 6–20)
CO2: 18 mmol/L — ABNORMAL LOW (ref 22–32)
Calcium: 8.2 mg/dL — ABNORMAL LOW (ref 8.9–10.3)
Chloride: 106 mmol/L (ref 98–111)
Creatinine, Ser: 5.98 mg/dL — ABNORMAL HIGH (ref 0.44–1.00)
GFR, Estimated: 9 mL/min — ABNORMAL LOW (ref >60)
Glucose, Bld: 206 mg/dL — ABNORMAL HIGH (ref 70–99)
Phosphorus: 4.4 mg/dL (ref 2.5–4.6)
Potassium: 3.7 mmol/L (ref 3.5–5.1)
Sodium: 135 mmol/L (ref 135–145)

## 2022-11-26 LAB — GLUCOSE, CAPILLARY
Glucose-Capillary: 204 mg/dL — ABNORMAL HIGH (ref 70–99)
Glucose-Capillary: 197 mg/dL — ABNORMAL HIGH (ref 70–99)
Glucose-Capillary: 152 mg/dL — ABNORMAL HIGH (ref 70–99)
Glucose-Capillary: 150 mg/dL — ABNORMAL HIGH (ref 70–99)

## 2022-11-26 LAB — SURGICAL PATHOLOGY

## 2022-11-26 MED ORDER — CEFAZOLIN SODIUM-DEXTROSE 1-4 GM/50ML-% IV SOLN
1.0000 g | INTRAVENOUS | Status: DC
  Administered 2022-11-26: 1 g via INTRAVENOUS
  Filled 2022-11-26 (×3): qty 50

## 2022-11-26 MED ORDER — ALTEPLASE 2 MG IJ SOLR
INTRAMUSCULAR | Status: AC
  Administered 2022-11-26: 6 mg via INTRAVENOUS_CENTRAL
  Filled 2022-11-26: qty 2

## 2022-11-26 MED ORDER — ALTEPLASE 2 MG IJ SOLR
INTRAMUSCULAR | Status: AC
  Filled 2022-11-26: qty 4

## 2022-11-26 NOTE — Progress Notes (Signed)
   Date of Admission:  11/20/2022     ID: Emma Thomas is a 27 y.o. 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)   Vomiting   ESRD on dialysis ([place])   PD catheter dysfunction (HCC)   Hypokalemia   Anemia of chronic disease   Anasarca   Hyponatremia   Osteomyelitis of left foot (HCC)    Subjective: Still has not had dialysis Cath not functioning  Medications:   amLODipine  10 mg Oral Daily   gentamicin cream  1 Application Topical Daily   heparin  5,000 Units Subcutaneous Q8H   insulin aspart  0-5 Units Subcutaneous QHS   insulin aspart  0-9 Units Subcutaneous TID WC   insulin aspart  3 Units Subcutaneous TID WC   insulin glargine-yfgn  6 Units Subcutaneous Q24H   losartan  100 mg Oral Daily   montelukast  10 mg Oral QHS   multivitamin with minerals  1 tablet Oral Daily    Objective: Vital signs in last 24 hours: Temp:  [98.3 F (36.8 C)-98.7 F (37.1 C)] 98.7 F (37.1 C) (01/11 0805) Pulse Rate:  [88-99] 97 (01/11 0805) Resp:  [17-18] 18 (01/11 0805) BP: (129-135)/(87-92) 135/92 (01/11 0805) SpO2:  [98 %-100 %] 98 % (01/11 0805)  LDA PD cath  PHYSICAL EXAM:  General: Alert, cooperative, no distress, appears stated age.  Edema eyelids Neck: Supple, symmetrical, no adenopathy, thyroid: non tender no carotid bruit and no JVD. Back: No CVA tenderness. Lungs: Clear to auscultation bilaterally. No Wheezing or Rhonchi. No rales. Heart: Regular rate and rhythm, no murmur, rub or gallop. Abdomen: Soft, non-tender,not distended. Bowel sounds normal. No masses Extremities:    Left great toe amputation site looks healthy Skin: No rashes or lesions. Or bruising Lymph: Cervical, supraclavicular normal. Neurologic: Grossly non-focal  Lab Results Recent Labs    11/24/22 0338 11/26/22 0916  WBC 8.9 6.8  HGB 7.2* 9.3*  HCT 23.2* 29.4*  NA 134* 135  K 3.8 3.7  CL  104 106  CO2 20* 18*  BUN 37* 28*  CREATININE 6.97* 5.98*    Assessment/Plan:  ?Diabetic foot infection with left great toe wound and osteomyelitis- s/p amputation of toe Culture MSSA Will change vanco to cefazolin Because of chronic osteomyelitis at the proximal margin she will be given IV cefazolin for 4 weeks minimum She will need a tunneled ctah because of Dialysis Would recommend Iv infusion nurse Pam to talk to her first and see whether she will be able to do IV antibiotic as well as PD at home IF so then can ask IR  to place a tunneled cath    Malfunctioning PD cath- was revised, but still not working    DM- on insulin   ESRD on PD- had missed some sessions due to malfunctioning cath   [facility] - management as per primary team   Discussed the management with patient  and care team

## 2022-11-26 NOTE — Progress Notes (Signed)
Progress Note   Patient: Emma Thomas OFB:[id number] DOB: [birth year] DOA: 11/20/2022     6 DOS: the patient was seen and examined on 11/26/2022   Brief hospital course: 1/7.  Surgery canceled today secondary to hypokalemia.  Replacing potassium orally.  Patient also stated that her peritoneal dialysis has not been working well even at home. 1/8.  Dr. Cleda Mccreedy podiatry did a amputation of the left hallux with partial first ray resection and incision and drainage of abscess left forefoot.  Patient had nausea after procedure and given a dose of Phenergan. 1/9.  Dr. Hampton Abbot took to the operating room for laparoscopic peritoneal dialysis revision with removal of fibrin clot.  Patient had nausea after surgery and given a dose of Phenergan 1/10.  No events overnight.  General surgery cleared the patient for discharge.  Currently awaiting wound cultures from debridement for antibiotic selection, infectious disease following.  Assessment and Plan: *27 year old female with history of type 1 diabetes mellitus, ESRD on PD, hypertension, anemia of chronic kidney disease presented to the emergency department on 11/20/2022 with complaints of left great toe being swollen.  X-ray of the foot showed cortical destruction of the distal first phalanx suspicious for osteomyelitis, medial forefoot soft tissue swelling without evidence of foreign body or soft tissue emphysema.  Podiatry is consulted, underwent amputation of the left hallux with a partial first ray resection and incision and drainage of the abscess of the left forefoot.  Cultures are positive for MRSA.  ID is following with the patient.  Depending pathology of the bone we will finalize the course of the antibiotic.  Patient also had malfunctioning of the peritoneal dialysis catheter, underwent revision by vascular surgery.  Acute osteomyelitis of the left first toe -S/p left first hallux and partial ray amputation with incision and drainage of the abscess on  1/8. -Cultures are positive for MRSA send-pathology is pending -ID, podiatry are following the patient  Nausea, vomiting -?  Gastroparesis -Resolved -Tolerating diet well.  Hypokalemia -Replaced, normalized.  PD catheter malfunction -S/p revision, removal of fibrin clot.  Type 1 diabetes mellitus -Continue with home regimen Lantus 6 units.  ESRD on PD -Nephrology is following with the patient.  Hyponatremia -Hypervolemic hyponatremia -Improved.  Anemia of chronic kidney disease -Get iron profile, B12, folate RBC, reticulocyte count documented in the correctable causes.       Subjective: Denies having any complaints.  Resting in the bed.  Patient's mother is at bedside Physical Exam: Vitals:   11/25/22 1558 11/25/22 2001 11/26/22 0507 11/26/22 0805  BP: 135/87 (!) 129/90 (!) 135/91 (!) 135/92  Pulse: 95 88 99 97  Resp: 18 17 17 18   Temp: 98.4 F (36.9 C) 98.3 F (36.8 C) 98.4 F (36.9 C) 98.7 F (37.1 C)  TempSrc: Oral   Oral  SpO2: 100% 98% 98% 98%  Weight:      Height:       Physical Exam HENT:     Head: Normocephalic.     Mouth/Throat:     Pharynx: No oropharyngeal exudate.  Eyes:     General: Lids are normal.     Conjunctiva/sclera: Conjunctivae normal.  Cardiovascular:     Rate and Rhythm: Normal rate and regular rhythm.     Heart sounds: Normal heart sounds, S1 normal and S2 normal.  Pulmonary:     Breath sounds: No decreased breath sounds, wheezing, rhonchi or rales.  Abdominal:     Palpations: Abdomen is soft.     Tenderness: There is  no abdominal tenderness.  Musculoskeletal:     Right lower leg: Swelling present.     Left lower leg: Swelling present.  Skin:    General: Skin is warm.     Comments: Status post left first toe amputation and an pressure dressing  Neurological:     Mental Status: She is alert and oriented to person, place, and time.     Data Reviewed: Sodium 134, creatinine 6.97, hemoglobin 7.2, white blood cell count  8.9, platelet count 462  Disposition: Status is: Inpatient Remains inpatient appropriate because: Podiatry recommended ID consultation for potential of IV antibiotics.  Nephrology would like to use the peritoneal dialysis catheter starting tomorrow.  Planned Discharge Destination: Home    Time spent: 27 minutes  Author: Monica Becton, MD 11/26/2022 10:54 AM  For on call review [email or web address].

## 2022-11-26 NOTE — Progress Notes (Signed)
Physical Therapy Treatment Patient Details Name: Emma Thomas MRN: [id number] DOB: 02-12-1996 Today's Date: 11/26/2022   History of Present Illness Pt is a 27 y.o. female with medical history significant of type 1 diabetes, ESRD on PD, and hypertension, who presents to the ED due to ulceration of the left toe.  Pt diagnosed with osteomyelitis of the left hallux and first metatarsal and is s/p amputation of the left hallux with partial first ray resection and I&D of an abscess of the left forefoot. Additionally pt s/p laparoscopic peritoneal dialysis revision with removal of fibrin clot.    PT Comments    Patient agreeable to PT. Gait training continued using crutches with good carry over of instruction. Increased gait distance to simulate home environment distances. Patient can maintain NWB for most of the ambulation bout, however had to place left heel down briefly x 2 bouts for a short rest break during the 70ft ambulation bout. Min guard to supervision provided for safety. Recommend to continue PT to maximize independence and decrease caregiver burden.    Recommendations for follow up therapy are one component of a multi-disciplinary discharge planning process, led by the attending physician.  Recommendations may be updated based on patient status, additional functional criteria and insurance authorization.  Follow Up Recommendations  Home health PT     Assistance Recommended at Discharge Intermittent Supervision/Assistance  Patient can return home with the following A little help with walking and/or transfers;A little help with bathing/dressing/bathroom;Assistance with cooking/housework;Assist for transportation;Help with stairs or ramp for entrance   Equipment Recommendations  Crutches    Recommendations for Other Services       Precautions / Restrictions Precautions Precautions: Fall Restrictions Weight Bearing Restrictions: Yes LLE Weight Bearing: Non weight bearing Other  Position/Activity Restrictions: Patient may be minimal weightbearing in her wedge surgical shoe on the left foot with pressure only on the heel using a walker or crutches only for bathroom access, otherwise NWB     Mobility  Bed Mobility Overal bed mobility: Independent                  Transfers Overall transfer level: Modified independent Equipment used: Crutches Transfers: Sit to/from Stand Sit to Stand: Supervision           General transfer comment: good safety awareness demonstrated without cues    Ambulation/Gait Ambulation/Gait assistance: Min guard, Supervision Gait Distance (Feet): 50 Feet (performed in order to simulate ambulating from her car to inside the home) Assistive device: Crutches Gait Pattern/deviations: Step-to pattern Gait velocity: decreased     General Gait Details: NWB maintained during most of the ambulation bout with reinforcement of technique, crutch height, wearing a shoe on the right foot, and using the wedge shoe on left. 2 short standing rest breaks required where patient placed left heel down to rest due to fatigue. encouraged patient to maintain NWB mostly with ambulation as per MD order. Min guard to supervision provided for safety   Stairs             Wheelchair Mobility    Modified Rankin (Stroke Patients Only)       Balance                                            Cognition Arousal/Alertness: Awake/alert Behavior During Therapy: WFL for tasks assessed/performed Overall Cognitive Status: Within Functional Limits for tasks  assessed                                          Exercises      General Comments General comments (skin integrity, edema, etc.): encouraged patient to elevate LLE when not ambulating. wedge shoe donned prior to mobilizing.      Pertinent Vitals/Pain Pain Assessment Pain Assessment: Faces Faces Pain Scale: Hurts even more Pain Location: L foot Pain  Descriptors / Indicators: Sore Pain Intervention(s): Limited activity within patient's tolerance, Monitored during session, Repositioned (encouraged elevation when not ambulating)    Home Living                          Prior Function            PT Goals (current goals can now be found in the care plan section) Acute Rehab PT Goals Patient Stated Goal: To get back to being independent PT Goal Formulation: With patient Time For Goal Achievement: 12/07/22 Potential to Achieve Goals: Good Progress towards PT goals: Progressing toward goals    Frequency    7X/week      PT Plan Current plan remains appropriate    Co-evaluation              AM-PAC PT "6 Clicks" Mobility   Outcome Measure  Help needed turning from your back to your side while in a flat bed without using bedrails?: None Help needed moving from lying on your back to sitting on the side of a flat bed without using bedrails?: None Help needed moving to and from a bed to a chair (including a wheelchair)?: A Little Help needed standing up from a chair using your arms (e.g., wheelchair or bedside chair)?: A Little Help needed to walk in hospital room?: A Little Help needed climbing 3-5 steps with a railing? : A Little 6 Click Score: 20    End of Session   Activity Tolerance: Patient tolerated treatment well Patient left: in bed;with call bell/phone within reach;with family/visitor present Nurse Communication: Mobility status PT Visit Diagnosis: Difficulty in walking, not elsewhere classified (R26.2);Pain Pain - Right/Left: Left Pain - part of body: Ankle and joints of foot     Time: 1139-1155 PT Time Calculation (min) (ACUTE ONLY): 16 min  Charges:  $Gait Training: 8-22 mins                    Minna Merritts, PT, MPT    Percell Locus 11/26/2022, 3:00 PM

## 2022-11-26 NOTE — Progress Notes (Signed)
2 Days Post-Op   Subjective/Chief Complaint: Patient seen.  Still complains of pain in her left foot.  States pain medication is helping.   Objective: Vital signs in last 24 hours: Temp:  [98.3 F (36.8 C)-98.7 F (37.1 C)] 98.7 F (37.1 C) (01/11 0805) Pulse Rate:  [88-99] 97 (01/11 0805) Resp:  [17-18] 18 (01/11 0805) BP: (129-135)/(87-92) 135/92 (01/11 0805) SpO2:  [98 %-100 %] 98 % (01/11 0805) Last BM Date : 11/22/22  Intake/Output from previous day: 01/10 0701 - 01/11 0700 In: 348  Out: 2 [Urine:2] Intake/Output this shift: No intake/output data recorded.  Bandage on the left foot is dry and intact.  Only a mild amount of bleeding and drainage from the proximal aspect of the incision.  The incision is well coapted.  No purulence.  Still some residual edema but overall appears stable.  Lab Results:  Recent Labs    11/24/22 0338  WBC 8.9  HGB 7.2*  HCT 23.2*  PLT 462*   BMET Recent Labs    11/24/22 0338  NA 134*  K 3.8  CL 104  CO2 20*  GLUCOSE 110*  BUN 37*  CREATININE 6.97*  CALCIUM 7.7*   PT/INR No results for input(s): "LABPROT", "INR" in the last 72 hours. ABG No results for input(s): "PHART", "HCO3" in the last 72 hours.  Invalid input(s): "PCO2", "PO2"  Studies/Results: No results found.  Anti-infectives: Anti-infectives (From admission, onward)    Start     Dose/Rate Route Frequency Ordered Stop   11/24/22 1000  ceFAZolin (ANCEF) IVPB 2g/100 mL premix        2 g 200 mL/hr over 30 Minutes Intravenous  Once 11/24/22 0958 11/24/22 1015   11/24/22 0955  ceFAZolin (ANCEF) 2-4 GM/100ML-% IVPB       Note to Pharmacy: Register, Santiago Glad A: cabinet override      11/24/22 0955 11/24/22 1027   11/23/22 1318  vancomycin (VANCOCIN) powder  Status:  Discontinued          As needed 11/23/22 1319 11/23/22 1323   11/23/22 1100  vancomycin (VANCOREADY) IVPB 1250 mg/250 mL        1,250 mg 166.7 mL/hr over 90 Minutes Intravenous  Once 11/23/22 0836  11/23/22 1817   11/22/22 1359  vancomycin variable dose per unstable renal function (pharmacist dosing)         Does not apply See admin instructions 11/22/22 1359     11/20/22 1400  vancomycin (VANCOREADY) IVPB 1250 mg/250 mL        1,250 mg 166.7 mL/hr over 90 Minutes Intravenous  Once 11/20/22 1325 11/20/22 2126   11/20/22 1330  cefTRIAXone (ROCEPHIN) 2 g in sodium chloride 0.9 % 100 mL IVPB  Status:  Discontinued        2 g 200 mL/hr over 30 Minutes Intravenous Every 24 hours 11/20/22 1315 11/25/22 1634   11/20/22 1330  metroNIDAZOLE (FLAGYL) IVPB 500 mg  Status:  Discontinued        500 mg 100 mL/hr over 60 Minutes Intravenous Every 12 hours 11/20/22 1315 11/25/22 1634       Assessment/Plan: s/p Procedure(s): LAPAROSCOPIC REVISION CONTINUOUS AMBULATORY PERITONEAL DIALYSIS  (CAPD) CATHETER (N/A) Assessment: Stable status post first ray resection left foot.  Plan: Betadine gauze applied to the incision followed by a sterile gauze bandage.  Patient was instructed to keep this clean and dry and do not remove.  Continue with limited weightbearing on the left foot using crutches and her wedge shoe with pressure  only on the heel.  Hopefully the pathology and cultures will return today.  If the bone is still involved at the cut and on pathology we will most likely have to consider infectious disease consult with plan for IV antibiotics.  Patient stable for discharge from podiatry standpoint.  I will follow-up with the patient in 1 week for reevaluation.  LOS: 6 days    Durward Fortes 11/26/2022

## 2022-11-26 NOTE — Progress Notes (Signed)
[facility]  PROGRESS NOTE   Subjective:   Patient laying in bed, alert and oriented Continues to complain of pain from left foot surgical wound.  Attempts to perform manual PD drain failed, painful when attempted Patient states she has no concerns during her treatments at home.   Objective:  Vital signs: Blood pressure (!) 135/92, pulse 97, temperature 98.7 F (37.1 C), temperature source Oral, resp. rate 18, height 5\' 6"  (1.676 m), weight 68 kg, last menstrual period 11/11/2022, SpO2 98 %.  Intake/Output Summary (Last 24 hours) at 11/26/2022 1145 Last data filed at 11/26/2022 0500 Gross per 24 hour  Intake 348 ml  Output 2 ml  Net 346 ml    Filed Weights   11/23/22 0429 11/24/22 0327 11/24/22 0854  Weight: 78.5 kg 81 kg 68 kg     Physical Exam: General:  No acute distress  Head:  Normocephalic, atraumatic. Moist oral mucosal membranes  Eyes:  Anicteric  Lungs:   Clear to auscultation, normal effort  Heart:  S1S2 no rubs  Abdomen:   Soft, nontender, bowel sounds present, PDC  Extremities: Trace peripheral edema.  Neurologic:  Awake, alert, following commands  Skin:   left great toe surgical dressing  Access: PD catheter    Basic Metabolic Panel: Recent Labs  Lab 11/20/22 1949 11/21/22 0516 11/22/22 0406 11/22/22 1939 11/23/22 0330 11/24/22 0338 11/26/22 0916  NA 133*   < > 133* 134* 135 134* 135  K 2.9*   < > 2.8* 3.0* 3.5 3.8 3.7  CL 96*   < > 99 100 102 104 106  CO2 22   < > 21* 22 21* 20* 18*  GLUCOSE 152*   < > 206* 106* 112* 110* 206*  BUN 40*   < > 42* 43* 42* 37* 28*  CREATININE 6.29*   < > 6.69* 7.33* 7.12* 6.97* 5.98*  CALCIUM 7.4*   < > 7.7* 7.7* 7.7* 7.7* 8.2*  MG 1.8  --  2.0  --   --   --   --   PHOS  --   --  4.7*  --   --   --  4.4   < > = values in this interval not displayed.    GFR: Estimated Creatinine Clearance: 13.1 mL/min (A) (by C-G formula based on SCr of 5.98 mg/dL (H)).  Liver Function Tests: Recent Labs  Lab  11/20/22 1107 11/21/22 0516 11/26/22 0916  AST 74* 53*  --   ALT 63* 50*  --   ALKPHOS 79 67  --   BILITOT 0.9 0.8  --   PROT 7.8 6.4*  --   ALBUMIN 2.2* 1.8* 1.9*    No results for input(s): "LIPASE", "AMYLASE" in the last 168 hours. No results for input(s): "AMMONIA" in the last 168 hours.  CBC: Recent Labs  Lab 11/20/22 1107 11/21/22 0516 11/22/22 0406 11/24/22 0338 11/26/22 0916  WBC 14.7* 14.0*  --  8.9 6.8  NEUTROABS 11.0* 11.2*  --   --   --   HGB 8.6* 7.4* 7.9* 7.2* 9.3*  HCT 27.6* 22.7*  --  23.2* 29.4*  MCV 85.4 84.7  --  86.2 87.8  PLT 490* 411*  --  462* 453*      HbA1C: Hgb A1c MFr Bld  Date/Time Value Ref Range Status  11/20/2022 07:49 PM 7.9 (H) 4.8 - 5.6 % Final    Comment:    (NOTE)         Prediabetes: 5.7 -  6.4         Diabetes: >6.4         Glycemic control for adults with diabetes: <7.0   04/19/2022 05:05 PM 9.4 (H) 4.8 - 5.6 % Final    Comment:    (NOTE) Pre diabetes:          5.7%-6.4%  Diabetes:              >6.4%  Glycemic control for   <7.0% adults with diabetes     Urinalysis: No results for input(s): "COLORURINE", "LABSPEC", "PHURINE", "GLUCOSEU", "HGBUR", "BILIRUBINUR", "KETONESUR", "PROTEINUR", "UROBILINOGEN", "NITRITE", "LEUKOCYTESUR" in the last 72 hours.  Invalid input(s): "APPERANCEUR"    Imaging: No results found.   Medications:     ceFAZolin (ANCEF) IV     dialysis solution 1.5% low-MG/low-CA      amLODipine  10 mg Oral Daily   gentamicin cream  1 Application Topical Daily   heparin  5,000 Units Subcutaneous Q8H   insulin aspart  0-5 Units Subcutaneous QHS   insulin aspart  0-9 Units Subcutaneous TID WC   insulin aspart  3 Units Subcutaneous TID WC   insulin glargine-yfgn  6 Units Subcutaneous Q24H   losartan  100 mg Oral Daily   montelukast  10 mg Oral QHS   multivitamin with minerals  1 tablet Oral Daily    Assessment/ Plan:     27 year old female with history of type 1 diabetes, end-stage renal  disease on peritoneal dialysis, hypertension now admitted with history of left toe ulceration.  She had an MRI showing osteomyelitis.  She was found to have hypokalemia and had multiple potassium supplements.   ESRD on peritoneal dialysis: Patient has PD catheter malfunction.  Appreciate surgery evaluation and revision on 11/24/2022.  Will attempt treatment during the day tomorrow.    ANEMIA with chronic kidney disease: Patient does receive monthly Mircera at outpatient clinic. Hgb has improved.    Secondary Hyperparathyroidism: with outpatient labs: PTH 291, phosphorus 4.4, calcium 7.7 on 11/19/22.   Lab Results  Component Value Date   CALCIUM 8.2 (L) 11/26/2022   CAION 0.89 (LL) 07/16/2022   PHOS 4.4 11/26/2022  Bone have reached acceptable range.   HTN/VOL with chronic kidney disease: Continue losartan and amlodipine.  Blood pressure 135/91, stable for this patient.    ACCESS: PD catheter has not been working well.  Surgery revision completed on 11/24/2022.Surgical note states fibrin clot found inside peritoneal dialysis catheter along with catheter outlet blocked by full bladder.  Patient encouraged to void prior to peritoneal dialysis treatments.   Failed manual drain with pain. After discussing with attending and surgery, will Cathflo [place] tonight.   Hypokalemia: Corrected     Acute osteomyelitis: Continue antibiotics with Rocephin, vancomycin and metronidazole. Surgery performed left great toe amputation on 11/24/2022.  Pain management and therapy sessions underway.  Diabetes mellitus, type 2 with chronic kidney disease: Continue insulin doses as ordered.   LOS: [facility] 1/11/202411:45 AM

## 2022-11-26 NOTE — Progress Notes (Signed)
Cathflo placed on [place].

## 2022-11-26 NOTE — Progress Notes (Signed)
Pharmacy Antibiotic Note  Emma Thomas is a 27 y.o. female admitted on 11/20/2022 with acute osteomyelitis of left first toe. Patient has PMH of DM1 with ESRD, on peritoneal dialysis.  Pharmacy has been consulted for vancomycin dosing.  Patient received vancomycin 1250mg  IV on 11/20/22 1913 and a level was drawn 3 days later  vancomycin random level: 21 mcg/mL 11/26/22 0326  Plan: No vancomycin dose today Repeat vancomycin level 11/27/22 am Re-dose vancomycin once trough <20 mg/L  Height: 5\' 6"  (167.6 cm) Weight: 68 kg (150 lb) IBW/kg (Calculated) : 59.3  Temp (24hrs), Avg:98.5 F (36.9 C), Min:98.3 F (36.8 C), Max:98.7 F (37.1 C)  Recent Labs  Lab 11/20/22 1107 11/20/22 1638 11/20/22 1949 11/21/22 0516 11/22/22 0406 11/22/22 1939 11/23/22 0330 11/24/22 0338 11/26/22 0326  WBC 14.7*  --   --  14.0*  --   --   --  8.9  --   CREATININE 5.91*  --    < > 6.45* 6.69* 7.33* 7.12* 6.97*  --   LATICACIDVEN  --  0.8  --   --   --   --   --   --   --   VANCORANDOM  --   --   --   --   --   --  11  --  21   < > = values in this interval not displayed.     Estimated Creatinine Clearance: 11.2 mL/min (A) (by C-G formula based on SCr of 6.97 mg/dL (H)).    No Known Allergies  Antimicrobials this admission: 1/5 vancomycin >> 1/5 metronidazole >>1/10 1/5 ceftriaxone>> 1/10  Microbiology results: 1/5  BCx: NG x 5 days 1/8 WCx: Staph Aureus (pending sensitivities) 1/8 MRSA PCR: negative  Thank you for allowing pharmacy to be a part of this patient's care.  Lorin Picket, PharmD Clinical Pharmacist 11/26/2022 8:43 AM

## 2022-11-26 NOTE — Progress Notes (Signed)
Pharmacy Antibiotic Note  Emma Thomas is a 27 y.o. female admitted on 11/20/2022 with acute osteomyelitis of left first toe. Patient has PMH of DM1 with ESRD, on peritoneal dialysis.  Pharmacy has been consulted for cefazolin dosing.   Abscess of left foot culture growing S. Aureus (S: oxacillin). Follow up nephrology plans. Last dose ceftriaxone 1/10 @1300 .  Plan:  Transition to cefazolin 1 gram every 24 hours (PD dosing)  Follow up nephrology plans/PD for dose adjustments, and length of therapy.  Height: 5\' 6"  (167.6 cm) Weight: 68 kg (150 lb) IBW/kg (Calculated) : 59.3  Temp (24hrs), Avg:98.5 F (36.9 C), Min:98.3 F (36.8 C), Max:98.7 F (37.1 C)  Recent Labs  Lab 11/20/22 1107 11/20/22 1638 11/20/22 1949 11/21/22 0516 11/22/22 0406 11/22/22 1939 11/23/22 0330 11/24/22 0338 11/26/22 0326 11/26/22 0916  WBC 14.7*  --   --  14.0*  --   --   --  8.9  --  6.8  CREATININE 5.91*  --    < > 6.45* 6.69* 7.33* 7.12* 6.97*  --  5.98*  LATICACIDVEN  --  0.8  --   --   --   --   --   --   --   --   VANCORANDOM  --   --   --   --   --   --  11  --  21  --    < > = values in this interval not displayed.     Estimated Creatinine Clearance: 13.1 mL/min (A) (by C-G formula based on SCr of 5.98 mg/dL (H)).    No Known Allergies  Antimicrobials this admission: 1/5 vancomycin >> 1/5 metronidazole >>1/10 1/5 ceftriaxone>> 1/10  Microbiology results: 1/5  BCx: NG x 5 days 1/8 WCx: Staph Aureus (S: oxacillin) 1/8 MRSA PCR: negative  Thank you for allowing pharmacy to be a part of this patient's care.   Glean Salvo, PharmD, BCPS Clinical Pharmacist  11/26/2022 11:05 AM

## 2022-11-27 ENCOUNTER — Inpatient Hospital Stay: Payer: Medicaid Other | Admitting: Radiology

## 2022-11-27 DIAGNOSIS — M8610 Other acute osteomyelitis, unspecified site: Secondary | ICD-10-CM | POA: Diagnosis not present

## 2022-11-27 HISTORY — PX: IR US GUIDE VASC ACCESS RIGHT: IMG2390

## 2022-11-27 HISTORY — PX: IR FLUORO GUIDE CV LINE RIGHT: IMG2283

## 2022-11-27 LAB — GLUCOSE, CAPILLARY
Glucose-Capillary: 187 mg/dL — ABNORMAL HIGH (ref 70–99)
Glucose-Capillary: 227 mg/dL — ABNORMAL HIGH (ref 70–99)
Glucose-Capillary: 224 mg/dL — ABNORMAL HIGH (ref 70–99)

## 2022-11-27 LAB — C-REACTIVE PROTEIN: CRP: 3 mg/dL — ABNORMAL HIGH (ref <1.0)

## 2022-11-27 LAB — SEDIMENTATION RATE: Sed Rate: 128 mm/hr — ABNORMAL HIGH (ref 0–20)

## 2022-11-27 MED ORDER — CEFAZOLIN SODIUM-DEXTROSE 1-4 GM/50ML-% IV SOLN
INTRAVENOUS | Status: AC
  Administered 2022-11-27: 1 g via INTRAVENOUS
  Filled 2022-11-27: qty 50

## 2022-11-27 MED ORDER — HEPARIN SODIUM (PORCINE) 1000 UNIT/ML IJ SOLN
INTRAPERITONEAL | Status: DC | PRN
  Filled 2022-11-27: qty 6000

## 2022-11-27 MED ORDER — FENTANYL CITRATE (PF) 100 MCG/2ML IJ SOLN
INTRAMUSCULAR | Status: AC
  Filled 2022-11-27: qty 2

## 2022-11-27 MED ORDER — HEPARIN SODIUM (PORCINE) 1000 UNIT/ML DIALYSIS: 1000.0000 [IU] | INTRAMUSCULAR | Status: DC | PRN

## 2022-11-27 MED ORDER — LIDOCAINE-PRILOCAINE 2.5-2.5 % EX CREA: 1.0000 | TOPICAL_CREAM | CUTANEOUS | Status: DC | PRN

## 2022-11-27 MED ORDER — CHLORHEXIDINE GLUCONATE CLOTH 2 % EX PADS
6.0000 | MEDICATED_PAD | Freq: Every day | CUTANEOUS | Status: DC
  Administered 2022-11-28: 6 via TOPICAL

## 2022-11-27 MED ORDER — HEPARIN SODIUM (PORCINE) 1000 UNIT/ML IJ SOLN
INTRAMUSCULAR | Status: AC
  Administered 2022-11-27: 10000 [IU]
  Filled 2022-11-27: qty 10

## 2022-11-27 MED ORDER — CEFAZOLIN SODIUM-DEXTROSE 1-4 GM/50ML-% IV SOLN
1.0000 g | INTRAVENOUS | Status: DC
  Filled 2022-11-27 (×3): qty 50

## 2022-11-27 MED ORDER — GENTAMICIN SULFATE 0.1 % EX CREA
1.0000 | TOPICAL_CREAM | Freq: Every day | CUTANEOUS | Status: DC
  Filled 2022-11-27: qty 15

## 2022-11-27 MED ORDER — SODIUM CHLORIDE 0.9 % IV SOLN
20.0000 ug | Freq: Once | INTRAVENOUS | Status: AC
  Administered 2022-11-27: 20 ug via INTRAVENOUS
  Filled 2022-11-27: qty 5

## 2022-11-27 MED ORDER — MIDAZOLAM HCL 2 MG/2ML IJ SOLN
INTRAMUSCULAR | Status: AC
  Filled 2022-11-27: qty 2

## 2022-11-27 MED ORDER — MIDAZOLAM HCL 2 MG/2ML IJ SOLN
INTRAMUSCULAR | Status: AC | PRN
  Administered 2022-11-27: 1 mg via INTRAVENOUS

## 2022-11-27 MED ORDER — PENTAFLUOROPROP-TETRAFLUOROETH EX AERO: 1.0000 | INHALATION_SPRAY | CUTANEOUS | Status: DC | PRN

## 2022-11-27 MED ORDER — ALTEPLASE 2 MG IJ SOLR: 2.0000 mg | Freq: Once | INTRAMUSCULAR | Status: DC | PRN

## 2022-11-27 MED ORDER — FENTANYL CITRATE (PF) 100 MCG/2ML IJ SOLN
INTRAMUSCULAR | Status: AC | PRN
  Administered 2022-11-27: 50 ug via INTRAVENOUS

## 2022-11-27 MED ORDER — ANTICOAGULANT SODIUM CITRATE 4% (200MG/5ML) IV SOLN: 5.0000 mL | Status: DC | PRN

## 2022-11-27 MED ORDER — LIDOCAINE HCL (PF) 1 % IJ SOLN: 5.0000 mL | INTRAMUSCULAR | Status: DC | PRN

## 2022-11-27 NOTE — Progress Notes (Signed)
[place] for instilling heparin in provided dialysate per placed PD protocol. 500mg /1L   No Known Allergies  Patient Measurements: Height: 5\' 6"  (167.6 cm) Weight: 74.3 kg (163 lb 14.4 oz) IBW/kg (Calculated) : 59.3  Vital Signs: Temp: 98.2 F (36.8 C) (01/12 0803) BP: 136/93 (01/12 0803) Pulse Rate: 89 (01/12 0803) Intake/Output from previous day: 01/11 0701 - 01/12 0700 In: 50 [IV Piggyback:50] Out: -  Intake/Output from this shift: No intake/output data recorded.  Labs: Recent Labs    11/26/22 0916  WBC 6.8  HGB 9.3*  HCT 29.4*  PLT 453*  CREATININE 5.98*  PHOS 4.4  ALBUMIN 1.9*   Estimated Creatinine Clearance: 14.4 mL/min (A) (by C-G formula based on SCr of 5.98 mg/dL (H)).   Microbiology: Recent Results (from the past 720 hour(s))  Blood Cultures x 2 sites     Status: None   Collection Time: 11/20/22  4:38 PM   Specimen: BLOOD LEFT HAND  Result Value Ref Range Status   Specimen Description BLOOD LEFT HAND  Final   Special Requests   Final    BOTTLES DRAWN AEROBIC AND ANAEROBIC Blood Culture results may not be optimal due to an inadequate volume of blood received in culture bottles   Culture   Final    NO GROWTH 5 DAYS Performed at [facility], [address] [place], [place] [address]    Report Status 11/25/2022 FINAL  Final  Blood Cultures x 2 sites     Status: None   Collection Time: 11/20/22  5:15 PM   Specimen: BLOOD LEFT FOREARM  Result Value Ref Range Status   Specimen Description BLOOD LEFT FOREARM  Final   Special Requests   Final    BOTTLES DRAWN AEROBIC AND ANAEROBIC Blood Culture adequate volume   Culture   Final    NO GROWTH 5 DAYS Performed at [facility], [address] [place], [place] [address]    Report Status 11/25/2022 FINAL  Final  Surgical pcr screen     Status: None   Collection Time: 11/23/22 11:08 AM   Specimen: Nasal Mucosa; Nasal Swab  Result Value Ref  Range Status   MRSA, PCR NEGATIVE NEGATIVE Final   Staphylococcus aureus NEGATIVE NEGATIVE Final    Comment: (NOTE) The Xpert SA Assay (FDA approved for NASAL specimens in patients 82 years of age and older), is one component of a comprehensive surveillance program. It is not intended to diagnose infection nor to guide or monitor treatment. Performed at [facility], [address] [place], [place] [address]   Aerobic/Anaerobic Culture w Gram Stain (surgical/deep wound)     Status: None (Preliminary result)   Collection Time: 11/23/22  1:03 PM   Specimen: Wound; Abscess  Result Value Ref Range Status   Specimen Description   Final    TISSUE Performed at [facility], [address] [place], [place] [address]    Special Requests   Final    LEFT FOOT Performed at [facility], [place], [place] [address]    Gram Stain NO WBC SEEN NO ORGANISMS SEEN   Final   Culture   Final    NO GROWTH 4 DAYS NO ANAEROBES ISOLATED; CULTURE IN PROGRESS FOR 5 DAYS Performed at [facility], [address], [place],  [address]    Report Status PENDING  Incomplete  Aerobic/Anaerobic Culture w Gram Stain (surgical/deep wound)     Status:  None (Preliminary result)   Collection Time: 11/23/22  1:03 PM   Specimen: Foot, Left; Tissue  Result Value Ref Range Status   Specimen Description   Final    ABSCESS Performed at [facility], [address] [place], [place] [address]    Special Requests   Final    LEFT FOOT Performed at [facility], [address] [place], [place] [address]    Gram Stain   Final    NO WBC SEEN RARE GRAM POSITIVE COCCI IN PAIRS Performed at [facility], [place] [address], [place], [place] [address]    Culture   Final    FEW STAPHYLOCOCCUS AUREUS NO ANAEROBES ISOLATED; CULTURE IN PROGRESS FOR 5 DAYS    Report Status PENDING  Incomplete   Organism ID, Bacteria STAPHYLOCOCCUS AUREUS   Final      Susceptibility   Staphylococcus aureus - MIC*    CIPROFLOXACIN <=0.5 SENSITIVE Sensitive     ERYTHROMYCIN >=8 RESISTANT Resistant     GENTAMICIN <=0.5 SENSITIVE Sensitive     OXACILLIN 0.5 SENSITIVE Sensitive     TETRACYCLINE <=1 SENSITIVE Sensitive     VANCOMYCIN <=0.5 SENSITIVE Sensitive     TRIMETH/SULFA <=10 SENSITIVE Sensitive     CLINDAMYCIN <=0.25 SENSITIVE Sensitive     RIFAMPIN <=0.5 SENSITIVE Sensitive     Inducible Clindamycin NEGATIVE Sensitive     * FEW STAPHYLOCOCCUS AUREUS    Medications:  Scheduled:   amLODipine  10 mg Oral Daily   gentamicin cream  1 Application Topical Daily   heparin  5,000 Units Subcutaneous Q8H   insulin aspart  0-5 Units Subcutaneous QHS   insulin aspart  0-9 Units Subcutaneous TID WC   insulin aspart  3 Units Subcutaneous TID WC   insulin glargine-yfgn  6 Units Subcutaneous Q24H   losartan  100 mg Oral Daily   montelukast  10 mg Oral QHS   multivitamin with minerals  1 tablet Oral Daily    Assessment: 27 year old female with history of type 1 diabetes, end-stage renal disease on peritoneal dialysis w/ catheter malfunction   Goal of Therapy:  instilling heparin in provided dialysate per placed PD protocol. 500 units/1L   Plan:  ---dialysis solution 1.5% low-MG/low-CA dianeal solution provided in 6000 mL bag ---3000 units heparin added to bag  Dallie Piles 11/27/2022,9:46 AM

## 2022-11-27 NOTE — Progress Notes (Signed)
[facility]  PROGRESS NOTE   Subjective:   Patient seen laying in bed, no family at bedside Patient appears tearful today States she is just ready for discharge Continues to complain of pain in left foot  Objective:  Vital signs: Blood pressure (!) 136/93, pulse 89, temperature 98.2 F (36.8 C), resp. rate 16, height 5\' 6"  (1.676 m), weight 74.3 kg, last menstrual period 11/11/2022, SpO2 97 %.  Intake/Output Summary (Last 24 hours) at 11/27/2022 1243 Last data filed at 11/26/2022 1742 Gross per 24 hour  Intake 50 ml  Output --  Net 50 ml    Filed Weights   11/24/22 0327 11/24/22 0854 11/27/22 0500  Weight: 81 kg 68 kg 74.3 kg     Physical Exam: General: Tearful  Head:  Normocephalic, atraumatic. Moist oral mucosal membranes  Eyes:  Anicteric  Lungs:   Clear to auscultation, normal effort  Heart:  S1S2 no rubs  Abdomen:   Soft, nontender, bowel sounds present, PDC  Extremities: Trace peripheral edema.  Neurologic:  Awake, alert, following commands  Skin:   left great toe surgical dressing  Access: PD catheter    Basic Metabolic Panel: Recent Labs  Lab 11/20/22 1949 11/21/22 0516 11/22/22 0406 11/22/22 1939 11/23/22 0330 11/24/22 0338 11/26/22 0916  NA 133*   < > 133* 134* 135 134* 135  K 2.9*   < > 2.8* 3.0* 3.5 3.8 3.7  CL 96*   < > 99 100 102 104 106  CO2 22   < > 21* 22 21* 20* 18*  GLUCOSE 152*   < > 206* 106* 112* 110* 206*  BUN 40*   < > 42* 43* 42* 37* 28*  CREATININE 6.29*   < > 6.69* 7.33* 7.12* 6.97* 5.98*  CALCIUM 7.4*   < > 7.7* 7.7* 7.7* 7.7* 8.2*  MG 1.8  --  2.0  --   --   --   --   PHOS  --   --  4.7*  --   --   --  4.4   < > = values in this interval not displayed.    GFR: Estimated Creatinine Clearance: 14.4 mL/min (A) (by C-G formula based on SCr of 5.98 mg/dL (H)).  Liver Function Tests: Recent Labs  Lab 11/21/22 0516 11/26/22 0916  AST 53*  --   ALT 50*  --   ALKPHOS 67  --   BILITOT 0.8  --   PROT 6.4*  --    ALBUMIN 1.8* 1.9*    No results for input(s): "LIPASE", "AMYLASE" in the last 168 hours. No results for input(s): "AMMONIA" in the last 168 hours.  CBC: Recent Labs  Lab 11/21/22 0516 11/22/22 0406 11/24/22 0338 11/26/22 0916  WBC 14.0*  --  8.9 6.8  NEUTROABS 11.2*  --   --   --   HGB 7.4* 7.9* 7.2* 9.3*  HCT 22.7*  --  23.2* 29.4*  MCV 84.7  --  86.2 87.8  PLT 411*  --  462* 453*      HbA1C: Hgb A1c MFr Bld  Date/Time Value Ref Range Status  11/20/2022 07:49 PM 7.9 (H) 4.8 - 5.6 % Final    Comment:    (NOTE)         Prediabetes: 5.7 - 6.4         Diabetes: >6.4         Glycemic control for adults with diabetes: <7.0   04/19/2022 05:05 PM 9.4 (H) 4.8 -  5.6 % Final    Comment:    (NOTE) Pre diabetes:          5.7%-6.4%  Diabetes:              >6.4%  Glycemic control for   <7.0% adults with diabetes     Urinalysis: No results for input(s): "COLORURINE", "LABSPEC", "PHURINE", "GLUCOSEU", "HGBUR", "BILIRUBINUR", "KETONESUR", "PROTEINUR", "UROBILINOGEN", "NITRITE", "LEUKOCYTESUR" in the last 72 hours.  Invalid input(s): "APPERANCEUR"    Imaging: No results found.   Medications:     ceFAZolin (ANCEF) IV 1 g (11/26/22 1545)   dialysis solution 1.5% low-MG/low-CA      amLODipine  10 mg Oral Daily   gentamicin cream  1 Application Topical Daily   heparin  5,000 Units Subcutaneous Q8H   insulin aspart  0-5 Units Subcutaneous QHS   insulin aspart  0-9 Units Subcutaneous TID WC   insulin aspart  3 Units Subcutaneous TID WC   insulin glargine-yfgn  6 Units Subcutaneous Q24H   losartan  100 mg Oral Daily   montelukast  10 mg Oral QHS   multivitamin with minerals  1 tablet Oral Daily    Assessment/ Plan:     27 year old female with history of type 1 diabetes, end-stage renal disease on peritoneal dialysis, hypertension now admitted with history of left toe ulceration.  She had an MRI showing osteomyelitis.  She was found to have hypokalemia and had  multiple potassium supplements.   ESRD on peritoneal dialysis: Patient has PD catheter malfunction.  Appreciate surgery evaluation and revision on 11/24/2022.  Patient received Cathflo dwell overnight and PD catheter.  Catheter continues to have difficulty draining.  Will transition patient to backup hemodialysis while evaluating PD catheter.  Patient notified and agreeable to plan.  Renal navigator currently seeking outpatient dialysis chair.   ANEMIA with chronic kidney disease: Patient does receive monthly Mircera at outpatient clinic.  Hemoglobin within desired goal.   Secondary Hyperparathyroidism: with outpatient labs: PTH 291, phosphorus 4.4, calcium 7.7 on 11/19/22.   Lab Results  Component Value Date   CALCIUM 8.2 (L) 11/26/2022   CAION 0.89 (LL) 07/16/2022   PHOS 4.4 11/26/2022  Calcium and phosphorus within desired range.  Will continue to monitor.   HTN/VOL with chronic kidney disease: Continue losartan and amlodipine.  Blood pressure acceptable.   ACCESS: PD catheter has not been working well.  Surgery revision completed on 11/24/2022.Surgical note states fibrin clot found inside peritoneal dialysis catheter along with catheter outlet blocked by full bladder.  Patient encouraged to void prior to peritoneal dialysis treatments.   Due to failed drain, patient received Cathflo dwell overnight. Initial drain flowed well however became stagnant.  Multiple alarms persisted despite repositioning.  Will transition to hemodialysis while PD catheter is evaluated.   Hypokalemia: Corrected     Acute osteomyelitis: Continue antibiotics with Rocephin, vancomycin and metronidazole. Surgery performed left great toe amputation on 11/24/2022.  Patient will require cefazolin with dialysis treatments, 2 g/2 g / 3 g until 12/21/2022.  Diabetes mellitus, type 2 with chronic kidney disease: Continue insulin doses as ordered.   LOS: [facility] 1/12/202412:43 PM

## 2022-11-27 NOTE — Progress Notes (Signed)
Per NT patient refused VS and CBG.   Fuller Mandril, RN

## 2022-11-27 NOTE — Progress Notes (Addendum)
PHARMACY CONSULT NOTE FOR:  OUTPATIENT  PARENTERAL ANTIBIOTIC THERAPY (OPAT)  Indication: MSSA foot osteomyelitis Regimen: Cefazolin 2-3gm IV qHD -If receives HD on TTS schedule - give cefazolin 2gm post dialysis on Tue and Thur and 3gm on Sat -If receives HD on MWF schedule, - give cefazolin 2gm post dialysis on Mon and Wed and 3gm on Fri End date: 12/21/2022  Labs - Once weekly:  CBC/D and CMP, ESR and CRP Fax weekly lab results  promptly to [phone]   IV antibiotic discharge orders are pended. To discharging provider:  please sign these orders via discharge navigator,  Select New Orders & click on the button choice - Manage This Unsigned Work.     Thank you for allowing pharmacy to be a part of this patient's care.  Doreene Eland, PharmD, BCPS, BCIDP Work Cell: [phone] 11/27/2022 11:02 AM

## 2022-11-27 NOTE — Discharge Planning (Signed)
Transitioning to HD at [facility] start tomorrow 1/13 at 7am, clinic is aware of IV antibiotics.

## 2022-11-27 NOTE — OR Nursing (Signed)
Pt reporting that she had negative pregnancy test prior to surgery. Not in results or anesthesia note. Pt reports she currently is on her period. She is not able to pee at this time. She is a dialysis patient. She signed waiver that there is no chance she is pregnant.

## 2022-11-27 NOTE — Treatment Plan (Addendum)
Diagnosis: Staph aureus foot infection with osteomyelitis Baseline Creatinine ESRD on PD   No Known Allergies  OPAT Orders Discharge antibiotics: Cefazolin to be given during dialysis 2 grams -2grams -3 grams ( if it is aTue/Thu/Sat schedule or Mon/wed/fri schedule) Duration: 4 weeks End Date: 12/21/22  Labs weekly while on IV antibiotics: _X_ CBC with differential  _X_ CMP _X_ CRP _X_ ESR   Fax weekly lab results  promptly to [phone]  Clinic Follow Up Appt: 12/29/22 at 9.15 Am   Call [phone] twith any concerns or critical value

## 2022-11-27 NOTE — TOC Transition Note (Addendum)
Transition of Care [facility]) - CM/SW Discharge Note   Patient Details  Name: Emma Thomas MRN: [id number] Date of Birth: Aug 08, 1996  Transition of Care [facility]) CM/SW Contact:  Magnus Ivan, LCSW Phone Number: 11/27/2022, 9:17 AM   Clinical Narrative:    Patient to DC home today with home IV antibiotics.  Pam with [facility] notified and has done teaching for IV meds.   [place] with [facility] notified and they will follow for [facility].  Notified Rhonda with [facility] that patient needs crutches delivered prior to DC today.  1:00- Notified by Care Team that patient will now get IV medications at dialysis, Dialysis Coordinator working on chair time.  CSW relayed update to [place] with [facility] and Pam with [facility].     Final next level of care: Home w Home Health Services Barriers to Discharge: Barriers Resolved   Patient Goals and CMS Choice CMS Medicare.gov Compare Post Acute Care list provided to:: Patient Choice offered to / list presented to : Patient  Discharge Placement                         Discharge Plan and Services Additional resources added to the After Visit Summary for                  DME Arranged: IV pump/equipment, Crutches DME Agency: [facility] Date DME Agency Contacted: 11/27/22   Representative spoke with at DME Agency: Jeannene Patella with [place] with [employer] for crutches HH Arranged: RN [place] Agency: [place] Date [place]: 11/27/22   Representative spoke with at [place]: [place]  Social Determinants of Health ([place]) Interventions SDOH Screenings   Food Insecurity: No Food Insecurity (11/21/2022)  Housing: Low Risk  (11/21/2022)  Transportation Needs: No Transportation Needs (11/21/2022)  Utilities: Not At Risk (11/21/2022)  Tobacco Use: Low Risk  (11/24/2022)     Readmission Risk Interventions    11/21/2022   11:46 AM 04/24/2022    1:30 PM  Readmission Risk Prevention Plan   Transportation Screening Complete Complete  PCP or Specialist Appt within 3-5 Days Complete Complete  HRI or Home Care Consult Complete Complete  Social Work Consult for [place] Planning/Counseling Complete Complete  Palliative Care Screening Not Applicable Not Applicable  Medication Review Press photographer) Complete Complete

## 2022-11-27 NOTE — Procedures (Signed)
Vascular and Interventional Radiology Procedure Note  Patient: Emma Thomas DOB: 05-Mar-1996 Medical Record Number: [id number] Note Date/Time: 11/27/22 4:54 PM   Performing Physician: Michaelle Birks, MD Assistant(s): None  Diagnosis: ESRD requiring Hemodialysis  Procedure: TUNNELED HEMODIALYSIS CATHETER PLACEMENT  Anesthesia: Conscious Sedation Complications: None Estimated Blood Loss: Minimal Specimens:  None  Findings:  Successful placement of right-sided, 19 cm (tip-to-cuff), tunneled hemodialysis catheter with the tip of the catheter in the proximal right atrium.  Plan: Catheter ready for use.  See detailed procedure note with images in PACS. The patient tolerated the procedure well without incident or complication and was returned to Recovery in stable condition.    Michaelle Birks, MD Vascular and Interventional Radiology Specialists [facility] Radiology   Pager. [place]

## 2022-11-27 NOTE — Progress Notes (Signed)
Patient returned from IR. Floor RN observed blood oozing from cath site. Per IR RN, site dressing was just changed prior to returning to to room. Per Dr. Maryelizabeth Kaufmann, give the ordered desmopressin to control the oozing blood at cath site. IV medication given. Patient refused RN to change dressing and bed linen a this time.   Fuller Mandril, RN

## 2022-11-27 NOTE — Progress Notes (Signed)
PT Cancellation Note  Patient Details Name: Emma Thomas MRN: [id number] DOB: Sep 10, 1996   Cancelled Treatment:    Reason Eval/Treat Not Completed: Patient at procedure or test/unavailable. PT will continue to follow while in the hospital.   Minna Merritts, PT, MPT  Percell Locus 11/27/2022, 2:06 PM

## 2022-11-27 NOTE — Consult Note (Signed)
Chief Complaint: Patient was seen in consultation today for image-guided tunneled hemodialysis catheter placement  Referring Physician(s): Colon Flattery, NP  Supervising Physician: Michaelle Birks  Patient Status: [name] - In-pt  History of Present Illness: Emma Thomas is a 27 y.o. female with PMH significant for anemia, type 1 diabetes mellitus with ESRD, essential hypertension, and recent left great toe amputation on 11/23/22 secondary to ulceration being seen today for image-guided tunneled hemodialysis catheter placement. The patient has an existing peritoneal dialysis catheter that underwent revision on 11/24/22, but it has not been functioning well. Due to this, IR has been consulted to place a tunneled HD catheter so that the patient can undergo hemodialysis.  Past Medical History:  Diagnosis Date   Anemia    Diabetes mellitus without complication ([place])    Type 1 DM   Essential hypertension    Headache    Hypertension 03/04/2013   Neurologic disorder    Both feet   Recurrent UTI    Renal disorder     Past Surgical History:  Procedure Laterality Date   abscess removal     excision of bartholin cyst   AMPUTATION Left 11/23/2022   Procedure: LEFT GREAT TOE PARTIAL RAY AMPUTATION;  Surgeon: Sharlotte Alamo, DPM;  Location: [facility];  Service: Podiatry;  Laterality: Left;   CAPD INSERTION N/A 06/09/2022   Procedure: LAPAROSCOPIC INSERTION CONTINUOUS AMBULATORY PERITONEAL DIALYSIS  (CAPD) CATHETER, PD rep to be present;  Surgeon: Olean Ree, MD;  Location: [facility];  Service: General;  Laterality: N/A;   CAPD REVISION N/A 07/16/2022   Procedure: LAPAROSCOPIC REVISION CONTINUOUS AMBULATORY PERITONEAL DIALYSIS  (CAPD) CATHETER;  Surgeon: Olean Ree, MD;  Location: [facility];  Service: General;  Laterality: N/A;   CAPD REVISION N/A 11/24/2022   Procedure: LAPAROSCOPIC REVISION CONTINUOUS AMBULATORY PERITONEAL DIALYSIS  (CAPD) CATHETER;  Surgeon: Olean Ree, MD;  Location: [facility]  [facility];  Service: General;  Laterality: N/A;   DIALYSIS/PERMA CATHETER INSERTION N/A 04/24/2022   Procedure: DIALYSIS/PERMA CATHETER INSERTION;  Surgeon: Katha Cabal, MD;  Location: [facility];  Service: Cardiovascular;  Laterality: N/A;   DIALYSIS/PERMA CATHETER REMOVAL N/A 06/02/2022   Procedure: DIALYSIS/PERMA CATHETER REMOVAL;  Surgeon: Katha Cabal, MD;  Location: [facility];  Service: Cardiovascular;  Laterality: N/A;   INSERTION OF DIALYSIS CATHETER Right    ARM   Nexplanon  [id number]   UMBILICAL HERNIA REPAIR N/A 06/09/2022   Procedure: HERNIA REPAIR UMBILICAL ADULT;  Surgeon: Olean Ree, MD;  Location: [facility];  Service: General;  Laterality: N/A;    Allergies: [name].  Medications: Prior to Admission medications   Medication Sig Start Date End Date Taking? Authorizing Provider  amLODipine (NORVASC) 10 MG tablet Take 1 tablet (10 mg total) by mouth daily. 04/28/22  Yes Jennye Boroughs, MD  atorvastatin (LIPITOR) 40 MG tablet Take 80 mg by mouth daily. 01/30/22  Yes [provider]  bumetanide (BUMEX) 1 MG tablet Take 1 mg by mouth 2 (two) times daily. 07/13/22  Yes [provider]  ferrous gluconate (FERGON) 324 MG tablet Take 324 mg by mouth daily with breakfast. 05/03/20  Yes [provider]  furosemide (LASIX) 80 MG tablet Take 80 mg by mouth daily. 08/10/22  Yes [provider]  insulin lispro (HUMALOG) 100 UNIT/ML injection Inject 5-10 Units into the skin 3 (three) times daily before meals. Plus sliding scale 1 unit per 50 > 150   Yes [provider]  losartan (COZAAR) 100  MG tablet Take 100 mg by mouth daily. 06/04/22  Yes [provider]  montelukast (SINGULAIR) 10 MG tablet Take 10 mg by mouth daily. 04/19/22  Yes [provider]  multivitamin (RENA-VIT) TABS tablet Take 1 tablet by mouth daily. 10/30/22  Yes [provider]  acetaminophen (TYLENOL) 500 MG  tablet Take 2 tablets (1,000 mg total) by mouth every 6 (six) hours as needed for mild pain. 07/16/22   Olean Ree, MD  JUNEL 1/20 1-20 MG-MCG tablet Take 1 tablet by mouth daily. 04/17/22   [provider]  oxyCODONE (OXY IR/ROXICODONE) 5 MG immediate release tablet Take 1 tablet (5 mg total) by mouth every 4 (four) hours as needed for severe pain. Patient not taking: Reported on 08/10/2022 07/16/22   Olean Ree, MD  predniSONE (DELTASONE) 20 MG tablet Take 1 tablet (20 mg total) by mouth daily with breakfast. Patient not taking: Reported on 11/20/2022 08/10/22   Nena Polio, MD  SUMAtriptan (IMITREX) 50 MG tablet Take 50 mg by mouth daily as needed for migraine. 11/11/21   [provider]  Vitamin D, Ergocalciferol, (DRISDOL) 1.25 MG (50000 UNIT) CAPS capsule Take 50,000 Units by mouth once a week. 02/05/22   [provider]     Family History  Problem Relation Age of Onset   Breast cancer Mother 23   Lung cancer Maternal Grandmother    Diabetes type II Paternal Grandmother     Social History   Socioeconomic History   Marital status: Married    Spouse name: Cameo   Number of children: 1   Years of education: Not on file   Highest education level: Not on file  Occupational History   Not on file  Tobacco Use   Smoking status: Never    Passive exposure: Past   Smokeless tobacco: Never  Vaping Use   Vaping Use: Never used  Substance and Sexual Activity   Alcohol use: Not Currently    Alcohol/week: 2.0 standard drinks of alcohol    Types: 2 Shots of liquor per week   Drug use: No   Sexual activity: Yes    Birth control/protection: None  Other Topics Concern   Not on file  Social History Narrative   Not on file   Social Determinants of Health   Financial Resource Strain: Not on file  Food Insecurity: No Food Insecurity (11/21/2022)   Hunger Vital Sign    Worried About Running Out of Food in the Last Year: Never true    Ran Out of Food in the  Last Year: Never true  Transportation Needs: No Transportation Needs (11/21/2022)   PRAPARE - Hydrologist (Medical): No    Lack of Transportation (Non-Medical): No  Physical Activity: Not on file  Stress: Not on file  Social Connections: Not on file     Review of Systems: A 12 point ROS discussed and pertinent positives are indicated in the HPI above.  All other systems are negative.  Review of Systems  Constitutional:  Negative for chills and fever.  Respiratory:  Negative for chest tightness and shortness of breath.   Cardiovascular:  Positive for leg swelling. Negative for chest pain.       Left leg swelling, patient states it has been swollen since her recent toe amputation  Gastrointestinal:  Positive for abdominal pain and nausea. Negative for diarrhea and vomiting.       Patient's abdominal pain is from recent abdominal surgery  Neurological:  Negative for dizziness and headaches.  Psychiatric/Behavioral:  Negative for confusion.     Vital Signs: BP (!) 136/93 (BP Location: Left Arm)   Pulse 89   Temp 98.2 F (36.8 C)   Resp 16   Ht 5\' 6"  (1.676 m)   Wt 163 lb 14.4 oz (74.3 kg)   LMP 11/11/2022 (Exact Date)   SpO2 97%   BMI 26.45 kg/m     Physical Exam Vitals reviewed.  Constitutional:      General: She is not in acute distress. HENT:     Mouth/Throat:     Mouth: Mucous membranes are moist.  Cardiovascular:     Rate and Rhythm: Normal rate and regular rhythm.     Pulses: Normal pulses.     Heart sounds: Normal heart sounds.  Pulmonary:     Effort: Pulmonary effort is normal.     Breath sounds: Normal breath sounds.  Abdominal:     General: Bowel sounds are normal.     Palpations: Abdomen is soft.     Tenderness: There is abdominal tenderness.  Musculoskeletal:     Right lower leg: No edema.     Left lower leg: Edema present.     Comments: Non-pitting edema, left foot wrapped in bandages  Skin:    General: Skin is warm and  dry.  Neurological:     Mental Status: She is alert and oriented to person, place, and time.  Psychiatric:        Mood and Affect: Mood normal.        Behavior: Behavior normal.        Thought Content: Thought content normal.     Imaging: DG Abd 2 Views  Result Date: 11/23/2022 CLINICAL DATA:  Peritoneal dialysis catheter dysfunction EXAM: ABDOMEN - 2 VIEW COMPARISON:  CT done on 04/19/2022 FINDINGS: Bowel gas pattern is nonspecific. Peritoneal dialysis catheter is noted entering the right abdominal wall with its tip overlying the sacrum at the level of pelvic inlet. In the upright view, right hemidiaphragm is not included limiting evaluation for pneumoperitoneum. No abnormal masses or calcifications are seen. Kidneys are partly obscured by bowel contents. There is edema in subcutaneous plane. IMPRESSION: Nonspecific bowel gas pattern. Tip of peritoneal dialysis catheter is noted in lower abdomen at the level of pelvic inlet in midline. There is no definite identifiable focal kink in the peritoneal dialysis catheter. There is edema in subcutaneous plane suggesting possible anasarca. Electronically Signed   By: Elmer Picker M.D.   On: 11/23/2022 10:51   MRI Left foot without contrast  Result Date: 11/20/2022 CLINICAL DATA:  Great toe pain and swelling, clinical concern for osteomyelitis based on radiography. EXAM: MRI OF THE LEFT FOOT WITHOUT CONTRAST TECHNIQUE: Multiplanar, multisequence MR imaging of the left forefoot was performed. No intravenous contrast was administered. COMPARISON:  Radiographs 11/20/2022 FINDINGS: Bones/Joint/Cartilage Severe marrow edema and cortical indistinctness in the distal phalanx great toe compatible with active osteomyelitis. Endosteal edema in the proximal phalanx of the great toe most notable along the plantar and proximal margins for example on image 8 series 11, reactive findings versus or early osteomyelitis. There is abnormal edema in both sesamoid bones  compatible with sesamoiditis, infection not readily excluded. Degenerative arthropathy at the articulation of the navicular and medial cuneiform as on image 14 series 8. There is a dorsal effusion of the first MTP joint and to a lesser extent of the interphalangeal joint of the great toe. Ligaments Lisfranc ligament intact Muscles and Tendons Diffuse  muscular edema in the region is likely neurogenic. Soft tissues Abnormal subcutaneous inflammatory process along the great toe plantar to the phalanges and distal metatarsal, primarily infiltrative although incipient abscess formation is difficult to exclude. Dorsal subcutaneous edema in the forefoot tracking into the base of the toes, cellulitis not excluded. Tiny focus of metal artifact plantar to the fifth MTP joint on image 22 series 5, no radiographic abnormality visible, possibly a microscopic metallic particle. IMPRESSION: 1. Active osteomyelitis of the distal phalanx great toe. 2. Endosteal edema in the proximal phalanx of the great toe most notable along the plantar and proximal margins, reactive findings versus early osteomyelitis. 3. Abnormal edema in both sesamoid bones compatible with sesamoiditis, infection not readily excluded. 4. Dorsal effusion of the first MTP joint and to a lesser extent of the interphalangeal joint of the great toe, septic joint not excluded. 5. Abnormal subcutaneous inflammatory process along the plantar aspect of the great toe, primarily infiltrative at this time without a definite drainable abscess. 6. Dorsal subcutaneous edema in the forefoot tracking into the base of the toes, cellulitis not excluded. 7. Diffuse muscular edema in the forefoot is likely neurogenic. 8. Degenerative arthropathy at the articulation of the navicular and medial cuneiform. 9. Tiny focus of metal artifact plantar to the fifth MTP joint, no radiographic abnormality visible, possibly a microscopic metallic particle. Electronically Signed   By: Van Clines M.D.   On: 11/20/2022 14:25   DG Foot Complete Left  Result Date: 11/20/2022 CLINICAL DATA:  Left foot and great toe pain and swelling. Patient reports the nail of her great toe falling off months ago. EXAM: LEFT FOOT - COMPLETE 3+ VIEW COMPARISON:  None Available. FINDINGS: The bones are demineralized. There is cortical destruction of the distal 1st phalanx which is highly suspicious for osteomyelitis. The proximal phalanx appears intact. The additional digits and metatarsals appear intact. There are moderately advanced degenerative changes at the articulation between the medial cuneiform and the navicular. There is medial forefoot soft tissue swelling without evidence of foreign body or soft tissue emphysema. IMPRESSION: 1. Cortical destruction of the distal 1st phalanx is highly suspicious for osteomyelitis. 2. Medial forefoot soft tissue swelling without evidence of foreign body or soft tissue emphysema. Electronically Signed   By: Richardean Sale M.D.   On: 11/20/2022 13:10    Labs:  CBC: Recent Labs    11/20/22 1107 11/21/22 0516 11/22/22 0406 11/24/22 0338 11/26/22 0916  WBC 14.7* 14.0*  --  8.9 6.8  HGB 8.6* 7.4* 7.9* 7.2* 9.3*  HCT 27.6* 22.7*  --  23.2* 29.4*  PLT 490* 411*  --  462* 453*    COAGS: No results for input(s): "INR", "APTT" in the last 8760 hours.  BMP: Recent Labs    11/22/22 1939 11/23/22 0330 11/24/22 0338 11/26/22 0916  NA 134* 135 134* 135  K 3.0* 3.5 3.8 3.7  CL 100 102 104 106  CO2 22 21* 20* 18*  GLUCOSE 106* 112* 110* 206*  BUN 43* 42* 37* 28*  CALCIUM 7.7* 7.7* 7.7* 8.2*  CREATININE 7.33* 7.12* 6.97* 5.98*  GFRNONAA 7* 7* 8* 9*    LIVER FUNCTION TESTS: Recent Labs    06/02/22 1205 08/10/22 0934 11/20/22 1107 11/21/22 0516 11/26/22 0916  BILITOT 0.7 0.9 0.9 0.8  --   AST 20 35 74* 53*  --   ALT 16 27 63* 50*  --   ALKPHOS 58 76 79 67  --   PROT 7.4 7.5 7.8 6.4*  --  ALBUMIN 2.9* 3.2* 2.2* 1.8* 1.9*    TUMOR  MARKERS: No results for input(s): "AFPTM", "CEA", "CA199", "CHROMGRNA" in the last 8760 hours.  Assessment and Plan:  Emma Thomas is a 27 yo female presenting today for image-guided tunneled hemodialysis catheter placement secondary to failure of existing peritoneal dialysis catheter. The patient has Type I diabetes mellitus causing ESRD. The case has been reviewed and approved by Dr Maryelizabeth Kaufmann, and is set to proceed on 11/27/22.  Risks and benefits discussed with the patient including, but not limited to bleeding, infection, vascular injury, pneumothorax which may require chest tube placement, air embolism or even death  All of the patient's questions were answered, patient is agreeable to proceed. Consent signed and in IR suite.    Thank you for this interesting consult.  I greatly enjoyed meeting CHALISA KOBLER and look forward to participating in their care.  A copy of this report was sent to the requesting provider on this date.  Electronically Signed: Lura Em, PA-C 11/27/2022, 1:54 PM   I spent a total of 40 Minutes    in face to face in clinical consultation, greater than 50% of which was counseling/coordinating care for image-guided tunneled hemodialysis catheter placement.

## 2022-11-27 NOTE — Progress Notes (Signed)
Pt unable to receive PD treatment. Flow drain errors. Pt denies any pain or nausea. Sherlyn Hay, NP notified.

## 2022-11-27 NOTE — Progress Notes (Signed)
   Date of Admission:  11/20/2022     ID: Emma Thomas is a 27 y.o. female Principal Problem:   Acute osteomyelitis ([place]) Active Problems:   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) ([place])   Vomiting   ESRD on dialysis ([place])   PD catheter dysfunction ([facility])   Hypokalemia   Anemia of chronic disease   Anasarca   Hyponatremia   Osteomyelitis of left foot ([facility])    Subjective: Still has not had dialysis As PD Cath not functioning she is going to get HD cath  Medications:   amLODipine  10 mg Oral Daily   [START ON 11/28/2022] Chlorhexidine Gluconate Cloth  6 each Topical Q0600   fentaNYL       [START ON 11/28/2022] gentamicin cream  1 Application Topical Daily   heparin  5,000 Units Subcutaneous Q8H   insulin aspart  0-5 Units Subcutaneous QHS   insulin aspart  0-9 Units Subcutaneous TID WC   insulin aspart  3 Units Subcutaneous TID WC   insulin glargine-yfgn  6 Units Subcutaneous Q24H   losartan  100 mg Oral Daily   midazolam       montelukast  10 mg Oral QHS   multivitamin with minerals  1 tablet Oral Daily    Objective: Vital signs in last 24 hours: Temp:  [97.6 F (36.4 C)-98.2 F (36.8 C)] 98.2 F (36.8 C) (01/12 1530) Pulse Rate:  [87-97] 91 (01/12 1708) Resp:  [10-23] 15 (01/12 1708) BP: (127-147)/(81-97) 138/92 (01/12 1630) SpO2:  [95 %-98 %] 95 % (01/12 1708) Weight:  [74.3 kg] 74.3 kg (01/12 0500)  LDA PD cath  PHYSICAL EXAM:  Gone for HD cath    Lab Results Recent Labs    11/26/22 0916  WBC 6.8  HGB 9.3*  HCT 29.4*  NA 135  K 3.7  CL 106  CO2 18*  BUN 28*  CREATININE 5.98*    Assessment/Plan:  ?Diabetic foot infection with left great toe wound and osteomyelitis- s/p amputation of toe Culture MSSA On  cefazolin Because of chronic osteomyelitis at the proximal margin she will be given IV cefazolin for 4 weeks minimum She will need a tunneled cath  because of Dialysis, and also it seems to get Hemodialysis as [place] not  working So now she will get cefazolin during Hemo dialysis== 2gms, 2grams and 3 grams depending on the days      ESRD on PD- had missed some sessions due to malfunctioning cath Malfunctioning PD cath- was revised, but still not working    DM- on insulin      Htn - management as per primary team  care team in detail ID will follow her peripherally this weekend

## 2022-11-27 NOTE — Discharge Summary (Signed)
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 [facility] Course: No notes on file  Assessment and Plan: 27 year old female with history of type 1 diabetes mellitus, ESRD on PD, hypertension, anemia of chronic kidney disease presented to the emergency department on 11/20/2022 with complaints of left great toe being swollen.  X-ray of the foot showed cortical destruction of the distal first phalanx suspicious for osteomyelitis, medial forefoot soft tissue swelling without evidence of foreign body or soft tissue emphysema.  Podiatry is consulted, underwent amputation of the left hallux with a partial first ray resection and incision and drainage of the abscess of the left forefoot.  Cultures are positive for MRSA.  ID is following with the patient.  Patient also had malfunctioning of the peritoneal dialysis catheter, underwent revision by vascular surgery.  Still having problem with the peritoneal dialysis catheter.  Nephrology plan to place a tunneled catheter, plan to arrange dialysis as an outpatient.  Patient will continue with antibiotics with the dialysis.   Acute osteomyelitis of the left first toe -S/p left first hallux and partial ray amputation with incision and drainage of the abscess on 1/8. -Cultures  are positive for MRSA send-pathology is pending -ID, podiatry are following the patient   Nausea, vomiting -?  Gastroparesis -Resolved -Tolerating diet well.   Hypokalemia -Replaced, normalized.   PD catheter malfunction -S/p revision, removal of fibrin clot.   Type 1 diabetes mellitus -Continue with home regimen Lantus 6 units.   ESRD on PD -Nephrology is following with the patient.   Hyponatremia -Hypervolemic hyponatremia -Improved.   Anemia of chronic kidney disease -Get iron profile, B12, folate RBC, reticulocyte count documented in the correctable causes.    Pain control - [employer] Controlled Substance Reporting System database was reviewed. and patient was instructed, not to drive, operate heavy machinery, perform activities at heights, swimming or participation in water activities or provide baby-sitting services while on Pain, Sleep and Anxiety Medications; until their outpatient Physician has advised to do so again. Also recommended to not to take more than prescribed Pain, Sleep and Anxiety Medications.  Consultants: Nephrology ID Podiatry Procedures performed: Amputation of the toe Disposition: Home Diet recommendation:  Discharge Diet Orders (From admission, onward)     Start     Ordered   11/27/22 0000  Diet - low sodium heart healthy        11/27/22 0947           Renal diet DISCHARGE MEDICATION: Allergies as of 11/28/2022   No Known Allergies      Medication List     TAKE these medications    acetaminophen 500 MG tablet Commonly known as: TYLENOL Take 2 tablets (1,000 mg total) by mouth every 6 (six) hours as needed for mild pain.   amLODipine  10 MG tablet Commonly known as: NORVASC Take 1 tablet (10 mg total) by mouth daily.   atorvastatin 40 MG tablet Commonly known as: LIPITOR Take 80 mg by mouth daily.   bumetanide 1 MG tablet Commonly known as: BUMEX Take 1 mg by mouth 2 (two) times daily.   ceFAZolin 1-4 GM/50ML-%  Soln Commonly known as: ANCEF Inject 50 mLs (1 g total) into the vein daily.   ferrous gluconate 324 MG tablet Commonly known as: FERGON Take 324 mg by mouth daily with breakfast.   furosemide 80 MG tablet Commonly known as: LASIX Take 80 mg by mouth daily.   insulin lispro 100 UNIT/ML injection Commonly known as: HUMALOG Inject 5-10 Units into the skin 3 (three) times daily before meals. Plus sliding scale 1 unit per 50 > 150   Junel 1/20 1-20 MG-MCG tablet Generic drug: norethindrone-ethinyl estradiol Take 1 tablet by mouth daily.   losartan 100 MG tablet Commonly known as: COZAAR Take 100 mg by mouth daily.   montelukast 10 MG tablet Commonly known as: SINGULAIR Take 10 mg by mouth daily.   multivitamin Tabs tablet Take 1 tablet by mouth daily.   oxyCODONE 5 MG immediate release tablet Commonly known as: Oxy IR/ROXICODONE Take 1 tablet (5 mg total) by mouth every 4 (four) hours as needed for severe pain.   polyethylene glycol 17 g packet Commonly known as: MIRALAX / GLYCOLAX Take 17 g by mouth daily as needed for mild constipation.   predniSONE 20 MG tablet Commonly known as: DELTASONE Take 1 tablet (20 mg total) by mouth daily with breakfast.   SUMAtriptan 50 MG tablet Commonly known as: IMITREX Take 50 mg by mouth daily as needed for migraine.   Vitamin D (Ergocalciferol) 1.25 MG (50000 UNIT) Caps capsule Commonly known as: DRISDOL Take 50,000 Units by mouth once a week.               Durable Medical Equipment  (From admission, onward)           Start     Ordered   11/25/22 1410  For home use only DME Crutches  Once        11/25/22 1410              Discharge Care Instructions  (From admission, onward)           Start     Ordered   11/27/22 0000  No dressing needed        11/27/22 4010            Follow-up Information     Olean Ree, MD. Schedule an appointment as soon as possible for a visit in 2 week(s).    Specialty: General Surgery Why: 12/11/22 at 9:30 AM  s/p PD cath revision Contact information: [address] [place] [address] [phone]                Discharge Exam: Danley Danker Weights   11/27/22 0500 11/28/22 0908 11/28/22 1240  Weight: 74.3 kg 74.3 kg 73.7 kg     Condition at discharge: stable  The results of significant diagnostics from this hospitalization (including imaging, microbiology, ancillary and laboratory) are listed below for reference.   Imaging Studies: IR Fluoro Guide CV Line Right  Result Date: 11/27/2022 INDICATION: ESRD on peritoneal dialysis. Peritoneal catheter malfunction, requiring HD. EXAM: TUNNELED HEMODIALYSIS CATHETER PLACEMENT MEDICATIONS: Ancef 1 gm IV . The antibiotic was given in an appropriate time interval prior to skin puncture.  ANESTHESIA/SEDATION: Moderate (conscious) sedation was employed during this procedure. A total of Versed 1 mg and Fentanyl 50 mcg was administered intravenously. Moderate Sedation Time: 32 minutes. The patient's level of consciousness and vital signs were monitored continuously by radiology nursing throughout the procedure under my direct supervision. FLUOROSCOPY TIME:  Fluoroscopic dose; 2.3 mGy COMPLICATIONS: None immediate. PROCEDURE: Informed written consent was obtained from the patient after a discussion of the risks, benefits, and alternatives to treatment. Questions regarding the procedure were encouraged and answered. The RIGHT neck and chest were prepped with chlorhexidine in a sterile fashion, and a sterile drape was applied covering the operative field. Maximum barrier sterile technique with sterile gowns and gloves were used for the procedure. A timeout was performed prior to the initiation of the procedure. After creating a small venotomy incision, a micropuncture kit was utilized to access the internal jugular vein. Real-time ultrasound guidance was utilized for vascular access including  the acquisition of a permanent ultrasound image documenting patency of the accessed vessel. The microwire was utilized to measure appropriate catheter length. A stiff Glidewire was advanced to the level of the IVC and the micropuncture sheath was exchanged for a peel-away sheath. A tunneled hemodialysis catheter measuring 19 cm from tip to cuff was tunneled in a retrograde fashion from the anterior chest wall to the venotomy incision. The catheter was then placed through the peel-away sheath with tips ultimately positioned within the superior aspect of the right atrium. Final catheter positioning was confirmed and documented with a spot radiographic image. The catheter aspirates and flushes normally. The catheter was flushed with appropriate volume heparin dwells. The catheter exit site was secured with a 2-0 Ethilon retention suture. The venotomy incision was closed with Dermabond. Dressings were applied. The patient tolerated the procedure well without immediate post procedural complication. IMPRESSION: Successful placement of 19 cm tip to cuff tunneled hemodialysis catheter via the RIGHT internal jugular vein. The tip of the catheter is positioned within the proximal RIGHT atrium. The catheter is ready for immediate use. Michaelle Birks, MD [facility] Radiology Electronically Signed   By: Michaelle Birks M.D.   On: 11/27/2022 16:53   IR US Guide Vasc Access Right  Result Date: 11/27/2022 INDICATION: ESRD on peritoneal dialysis. Peritoneal catheter malfunction, requiring HD. EXAM: TUNNELED HEMODIALYSIS CATHETER PLACEMENT MEDICATIONS: Ancef 1 gm IV . The antibiotic was given in an appropriate time interval prior to skin puncture. ANESTHESIA/SEDATION: Moderate (conscious) sedation was employed during this procedure. A total of Versed 1 mg and Fentanyl 50 mcg was administered intravenously. Moderate Sedation Time: 32 minutes. The patient's level of consciousness and  vital signs were monitored continuously by radiology nursing throughout the procedure under my direct supervision. FLUOROSCOPY TIME:  Fluoroscopic dose; 2.3 mGy COMPLICATIONS: None immediate. PROCEDURE: Informed written consent was obtained from the patient after a discussion of the risks, benefits, and alternatives to treatment. Questions regarding the procedure were encouraged and answered. The RIGHT neck and chest were prepped with chlorhexidine in a sterile fashion, and a sterile drape was applied covering the operative field. Maximum barrier sterile technique with sterile gowns and gloves were used for the procedure. A timeout was performed prior to the initiation of the procedure. After creating a small venotomy incision, a micropuncture kit was utilized to access the internal jugular vein. Real-time ultrasound guidance was utilized for vascular access including the acquisition of a permanent ultrasound image documenting patency of the accessed vessel. The microwire was utilized to measure appropriate catheter  length. A stiff Glidewire was advanced to the level of the IVC and the micropuncture sheath was exchanged for a peel-away sheath. A tunneled hemodialysis catheter measuring 19 cm from tip to cuff was tunneled in a retrograde fashion from the anterior chest wall to the venotomy incision. The catheter was then placed through the peel-away sheath with tips ultimately positioned within the superior aspect of the right atrium. Final catheter positioning was confirmed and documented with a spot radiographic image. The catheter aspirates and flushes normally. The catheter was flushed with appropriate volume heparin dwells. The catheter exit site was secured with a 2-0 Ethilon retention suture. The venotomy incision was closed with Dermabond. Dressings were applied. The patient tolerated the procedure well without immediate post procedural complication. IMPRESSION: Successful placement of 19 cm tip to cuff  tunneled hemodialysis catheter via the RIGHT internal jugular vein. The tip of the catheter is positioned within the proximal RIGHT atrium. The catheter is ready for immediate use. Michaelle Birks, MD [facility] Radiology Electronically Signed   By: Michaelle Birks M.D.   On: 11/27/2022 16:53   DG Abd 2 Views  Result Date: 11/23/2022 CLINICAL DATA:  Peritoneal dialysis catheter dysfunction EXAM: ABDOMEN - 2 VIEW COMPARISON:  CT done on 04/19/2022 FINDINGS: Bowel gas pattern is nonspecific. Peritoneal dialysis catheter is noted entering the right abdominal wall with its tip overlying the sacrum at the level of pelvic inlet. In the upright view, right hemidiaphragm is not included limiting evaluation for pneumoperitoneum. No abnormal masses or calcifications are seen. Kidneys are partly obscured by bowel contents. There is edema in subcutaneous plane. IMPRESSION: Nonspecific bowel gas pattern. Tip of peritoneal dialysis catheter is noted in lower abdomen at the level of pelvic inlet in midline. There is no definite identifiable focal kink in the peritoneal dialysis catheter. There is edema in subcutaneous plane suggesting possible anasarca. Electronically Signed   By: Elmer Picker M.D.   On: 11/23/2022 10:51   MRI Left foot without contrast  Result Date: 11/20/2022 CLINICAL DATA:  Great toe pain and swelling, clinical concern for osteomyelitis based on radiography. EXAM: MRI OF THE LEFT FOOT WITHOUT CONTRAST TECHNIQUE: Multiplanar, multisequence MR imaging of the left forefoot was performed. No intravenous contrast was administered. COMPARISON:  Radiographs 11/20/2022 FINDINGS: Bones/Joint/Cartilage Severe marrow edema and cortical indistinctness in the distal phalanx great toe compatible with active osteomyelitis. Endosteal edema in the proximal phalanx of the great toe most notable along the plantar and proximal margins for example on image 8 series 11,  reactive findings versus or early osteomyelitis. There is abnormal edema in both sesamoid bones compatible with sesamoiditis, infection not readily excluded. Degenerative arthropathy at the articulation of the navicular and medial cuneiform as on image 14 series 8. There is a dorsal effusion of the first MTP joint and to a lesser extent of the interphalangeal joint of the great toe. Ligaments Lisfranc ligament intact Muscles and Tendons Diffuse muscular edema in the region is likely neurogenic. Soft tissues Abnormal subcutaneous inflammatory process along the great toe plantar to the phalanges and distal metatarsal, primarily infiltrative although incipient abscess formation is difficult to exclude. Dorsal subcutaneous edema in the forefoot tracking into the base of the toes, cellulitis not excluded. Tiny focus of metal artifact plantar to the fifth MTP joint on image 22 series 5, no radiographic abnormality visible, possibly a microscopic metallic particle. IMPRESSION: 1. Active osteomyelitis of the distal phalanx great toe. 2. Endosteal edema in the proximal phalanx of the  great toe most notable along the plantar and proximal margins, reactive findings versus early osteomyelitis. 3. Abnormal edema in both sesamoid bones compatible with sesamoiditis, infection not readily excluded. 4. Dorsal effusion of the first MTP joint and to a lesser extent of the interphalangeal joint of the great toe, septic joint not excluded. 5. Abnormal subcutaneous inflammatory process along the plantar aspect of the great toe, primarily infiltrative at this time without a definite drainable abscess. 6. Dorsal subcutaneous edema in the forefoot tracking into the base of the toes, cellulitis not excluded. 7. Diffuse muscular edema in the forefoot is likely neurogenic. 8. Degenerative arthropathy at the articulation of the navicular and medial cuneiform. 9. Tiny focus of metal artifact plantar to the fifth MTP joint, no radiographic  abnormality visible, possibly a microscopic metallic particle. Electronically Signed   By: Van Clines M.D.   On: 11/20/2022 14:25   DG Foot Complete Left  Result Date: 11/20/2022 CLINICAL DATA:  Left foot and great toe pain and swelling. Patient reports the nail of her great toe falling off months ago. EXAM: LEFT FOOT - COMPLETE 3+ VIEW COMPARISON:  None Available. FINDINGS: The bones are demineralized. There is cortical destruction of the distal 1st phalanx which is highly suspicious for osteomyelitis. The proximal phalanx appears intact. The additional digits and metatarsals appear intact. There are moderately advanced degenerative changes at the articulation between the medial cuneiform and the navicular. There is medial forefoot soft tissue swelling without evidence of foreign body or soft tissue emphysema. IMPRESSION: 1. Cortical destruction of the distal 1st phalanx is highly suspicious for osteomyelitis. 2. Medial forefoot soft tissue swelling without evidence of foreign body or soft tissue emphysema. Electronically Signed   By: Richardean Sale M.D.   On: 11/20/2022 13:10    Microbiology: Results for orders placed or performed during the hospital encounter of 11/20/22  Blood Cultures x 2 sites     Status: None   Collection Time: 11/20/22  4:38 PM   Specimen: BLOOD LEFT HAND  Result Value Ref Range Status   Specimen Description BLOOD LEFT HAND  Final   Special Requests   Final    BOTTLES DRAWN AEROBIC AND ANAEROBIC Blood Culture results may not be optimal due to an inadequate volume of blood received in culture bottles   Culture   Final    NO GROWTH 5 DAYS Performed at [facility], [address] [place], [place] [address]    Report Status 11/25/2022 FINAL  Final  Blood Cultures x 2 sites     Status: None   Collection Time: 11/20/22  5:15 PM   Specimen: BLOOD LEFT FOREARM  Result Value Ref Range Status   Specimen Description BLOOD LEFT FOREARM  Final   Special  Requests   Final    BOTTLES DRAWN AEROBIC AND ANAEROBIC Blood Culture adequate volume   Culture   Final    NO GROWTH 5 DAYS Performed at [facility], [address] [place], [place] [address]    Report Status 11/25/2022 FINAL  Final  Surgical pcr screen     Status: None   Collection Time: 11/23/22 11:08 AM   Specimen: Nasal Mucosa; Nasal Swab  Result Value Ref Range Status   MRSA, PCR NEGATIVE NEGATIVE Final   Staphylococcus aureus NEGATIVE NEGATIVE Final    Comment: (NOTE) The Xpert SA Assay (FDA approved for NASAL specimens in patients 10 years of age and older), is one component of a comprehensive surveillance program. It is not intended  to diagnose infection nor to guide or monitor treatment. Performed at [facility], [address] [place], [place] [address]   Aerobic/Anaerobic Culture w Gram Stain (surgical/deep wound)     Status: None   Collection Time: 11/23/22  1:03 PM   Specimen: Wound; Abscess  Result Value Ref Range Status   Specimen Description   Final    TISSUE Performed at [facility], [address], [place], [place] [address]    Special Requests   Final    LEFT FOOT Performed at [facility], [place], [place] [address]    Gram Stain NO WBC SEEN NO ORGANISMS SEEN   Final   Culture   Final    No growth aerobically or anaerobically. Performed at [facility], [place] [address], [place], [place] [address]    Report Status 11/28/2022 FINAL  Final  Aerobic/Anaerobic Culture w Gram Stain (surgical/deep wound)     Status: None   Collection Time: 11/23/22  1:03 PM   Specimen: Foot, Left; Tissue  Result Value Ref Range Status   Specimen Description   Final    ABSCESS Performed at [facility], [address], [place], [place] [address]    Special Requests   Final    LEFT FOOT Performed at [facility], [address] [place], [place] [address]    Gram Stain NO WBC  SEEN RARE GRAM POSITIVE COCCI IN PAIRS   Final   Culture   Final    FEW STAPHYLOCOCCUS AUREUS NO ANAEROBES ISOLATED Performed at [facility], [place] [address], [place],  [address]    Report Status 11/28/2022 FINAL  Final   Organism ID, Bacteria STAPHYLOCOCCUS AUREUS  Final      Susceptibility   Staphylococcus aureus - MIC*    CIPROFLOXACIN <=0.5 SENSITIVE Sensitive     ERYTHROMYCIN >=8 RESISTANT Resistant     GENTAMICIN <=0.5 SENSITIVE Sensitive     OXACILLIN 0.5 SENSITIVE Sensitive     TETRACYCLINE <=1 SENSITIVE Sensitive     VANCOMYCIN <=0.5 SENSITIVE Sensitive     TRIMETH/SULFA <=10 SENSITIVE Sensitive     CLINDAMYCIN <=0.25 SENSITIVE Sensitive     RIFAMPIN <=0.5 SENSITIVE Sensitive     Inducible Clindamycin NEGATIVE Sensitive     * FEW STAPHYLOCOCCUS AUREUS    Labs: CBC: Recent Labs  Lab 11/22/22 0406 11/24/22 0338 11/26/22 0916 11/28/22 0919  WBC  --  8.9 6.8 7.2  HGB 7.9* 7.2* 9.3* 8.4*  HCT  --  23.2* 29.4* 27.2*  MCV  --  86.2 87.8 88.6  PLT  --  462* 453* 503   Basic Metabolic Panel: Recent Labs  Lab 11/22/22 0406 11/22/22 1939 11/23/22 0330 11/24/22 0338 11/26/22 0916 11/28/22 0919  NA 133* 134* 135 134* 135 134*  K 2.8* 3.0* 3.5 3.8 3.7 3.5  CL 99 100 102 104 106 106  CO2 21* 22 21* 20* 18* 20*  GLUCOSE 206* 106* 112* 110* 206* 268*  BUN 42* 43* 42* 37* 28* 23*  CREATININE 6.69* 7.33* 7.12* 6.97* 5.98* 5.40*  CALCIUM 7.7* 7.7* 7.7* 7.7* 8.2* 8.4*  MG 2.0  --   --   --   --   --   PHOS 4.7*  --   --   --  4.4  --    Liver Function Tests: Recent Labs  Lab 11/26/22 0916  ALBUMIN 1.9*   CBG: Recent Labs  Lab 11/27/22 1205 11/27/22 2108 11/28/22 0812 11/28/22  1313 11/28/22 1401  GLUCAP 227* 224* 268* 65* 89    Discharge time spent: greater than 30 minutes.  SignedMonica Becton, MD [facility] 11/28/2022

## 2022-11-28 DIAGNOSIS — M869 Osteomyelitis, unspecified: Secondary | ICD-10-CM | POA: Diagnosis not present

## 2022-11-28 LAB — GLUCOSE, CAPILLARY
Glucose-Capillary: 268 mg/dL — ABNORMAL HIGH (ref 70–99)
Glucose-Capillary: 65 mg/dL — ABNORMAL LOW (ref 70–99)
Glucose-Capillary: 89 mg/dL (ref 70–99)

## 2022-11-28 LAB — CBC
HCT: 27.2 % — ABNORMAL LOW (ref 36.0–46.0)
Hemoglobin: 8.4 g/dL — ABNORMAL LOW (ref 12.0–15.0)
MCH: 27.4 pg (ref 26.0–34.0)
MCHC: 30.9 g/dL (ref 30.0–36.0)
MCV: 88.6 fL (ref 80.0–100.0)
Platelets: 400 10*3/uL (ref 150–400)
RBC: 3.07 MIL/uL — ABNORMAL LOW (ref 3.87–5.11)
RDW: 13.2 % (ref 11.5–15.5)
WBC: 7.2 10*3/uL (ref 4.0–10.5)
nRBC: 0 % (ref 0.0–0.2)

## 2022-11-28 LAB — BASIC METABOLIC PANEL
Anion gap: 8 (ref 5–15)
BUN: 23 mg/dL — ABNORMAL HIGH (ref 6–20)
CO2: 20 mmol/L — ABNORMAL LOW (ref 22–32)
Calcium: 8.4 mg/dL — ABNORMAL LOW (ref 8.9–10.3)
Chloride: 106 mmol/L (ref 98–111)
Creatinine, Ser: 5.4 mg/dL — ABNORMAL HIGH (ref 0.44–1.00)
GFR, Estimated: 10 mL/min — ABNORMAL LOW (ref >60)
Glucose, Bld: 268 mg/dL — ABNORMAL HIGH (ref 70–99)
Potassium: 3.5 mmol/L (ref 3.5–5.1)
Sodium: 134 mmol/L — ABNORMAL LOW (ref 135–145)

## 2022-11-28 LAB — HEPATITIS B SURFACE ANTIBODY, QUANTITATIVE: Hep B S AB Quant (Post): >1000 m[IU]/mL (ref >9.9)

## 2022-11-28 LAB — AEROBIC/ANAEROBIC CULTURE W GRAM STAIN (SURGICAL/DEEP WOUND)
Culture: NO GROWTH
Gram Stain: NONE SEEN
Gram Stain: NONE SEEN

## 2022-11-28 MED ORDER — POLYETHYLENE GLYCOL 3350 17 G PO PACK: 17.0000 g | PACK | Freq: Every day | ORAL | 0 refills | Status: DC | PRN

## 2022-11-28 MED ORDER — CEFAZOLIN IN SODIUM CHLORIDE 3-0.9 GM/100ML-% IV SOLN
3.0000 g | Freq: Once | INTRAVENOUS | Status: AC
  Administered 2022-11-28: 3 g via INTRAVENOUS
  Filled 2022-11-28: qty 100

## 2022-11-28 MED ORDER — CEFAZOLIN SODIUM-DEXTROSE 1-4 GM/50ML-% IV SOLN: 1.0000 g | INTRAVENOUS | Status: DC

## 2022-11-28 MED ORDER — OXYCODONE HCL 5 MG PO TABS: 5.0000 mg | ORAL_TABLET | Freq: Four times a day (QID) | ORAL | 0 refills | Status: DC | PRN

## 2022-11-28 MED ORDER — HEPARIN SODIUM (PORCINE) 1000 UNIT/ML IJ SOLN
INTRAMUSCULAR | Status: AC
  Filled 2022-11-28: qty 10

## 2022-11-28 NOTE — Progress Notes (Signed)
[facility]  PROGRESS NOTE   Subjective:   Patient seen and evaluated during dialysis   HEMODIALYSIS FLOWSHEET:  Blood Flow Rate (mL/min): 400 mL/min Arterial Pressure (mmHg): -180 mmHg Venous Pressure (mmHg): 180 mmHg TMP (mmHg): -16 mmHg Ultrafiltration Rate (mL/min): 433 mL/min Dialysate Flow Rate (mL/min): 300 ml/min  Tolerating treatment well Complains of soreness from PermCath site  Objective:  Vital signs: Blood pressure (!) 137/94, pulse 93, temperature 98.7 F (37.1 C), temperature source Oral, resp. rate 18, height 5\' 6"  (1.676 m), weight 74.3 kg, last menstrual period 11/11/2022, SpO2 97 %.  Intake/Output Summary (Last 24 hours) at 11/28/2022 1027 Last data filed at 11/28/2022 0618 Gross per 24 hour  Intake 336.84 ml  Output --  Net 336.84 ml    Filed Weights   11/24/22 0854 11/27/22 0500 11/28/22 0908  Weight: 68 kg 74.3 kg 74.3 kg     Physical Exam: General: NAD  Head:  Normocephalic, atraumatic. Moist oral mucosal membranes  Eyes:  Anicteric  Lungs:   Clear to auscultation, normal effort  Heart:  S1S2 no rubs  Abdomen:   Soft, nontender, bowel sounds present, PDC  Extremities: Trace peripheral edema.  Neurologic:  Awake, alert, following commands  Skin:   left great toe surgical dressing  Access: PD catheter, right chest PermCath    Basic Metabolic Panel: Recent Labs  Lab 11/22/22 0406 11/22/22 1939 11/23/22 0330 11/24/22 0338 11/26/22 0916 11/28/22 0919  NA 133* 134* 135 134* 135 134*  K 2.8* 3.0* 3.5 3.8 3.7 3.5  CL 99 100 102 104 106 106  CO2 21* 22 21* 20* 18* 20*  GLUCOSE 206* 106* 112* 110* 206* 268*  BUN 42* 43* 42* 37* 28* 23*  CREATININE 6.69* 7.33* 7.12* 6.97* 5.98* 5.40*  CALCIUM 7.7* 7.7* 7.7* 7.7* 8.2* 8.4*  MG 2.0  --   --   --   --   --   PHOS 4.7*  --   --   --  4.4  --     GFR: Estimated Creatinine Clearance: 16 mL/min (A) (by C-G formula based on SCr of 5.4 mg/dL (H)).  Liver Function Tests: Recent  Labs  Lab 11/26/22 0916  ALBUMIN 1.9*    No results for input(s): "LIPASE", "AMYLASE" in the last 168 hours. No results for input(s): "AMMONIA" in the last 168 hours.  CBC: Recent Labs  Lab 11/22/22 0406 11/24/22 0338 11/26/22 0916 11/28/22 0919  WBC  --  8.9 6.8 7.2  HGB 7.9* 7.2* 9.3* 8.4*  HCT  --  23.2* 29.4* 27.2*  MCV  --  86.2 87.8 88.6  PLT  --  462* 453* 400      HbA1C: Hgb A1c MFr Bld  Date/Time Value Ref Range Status  11/20/2022 07:49 PM 7.9 (H) 4.8 - 5.6 % Final    Comment:    (NOTE)         Prediabetes: 5.7 - 6.4         Diabetes: >6.4         Glycemic control for adults with diabetes: <7.0   04/19/2022 05:05 PM 9.4 (H) 4.8 - 5.6 % Final    Comment:    (NOTE) Pre diabetes:          5.7%-6.4%  Diabetes:              >6.4%  Glycemic control for   <7.0% adults with diabetes     Urinalysis: No results for input(s): "COLORURINE", "LABSPEC", "PHURINE", "GLUCOSEU", "HGBUR", "BILIRUBINUR", "  KETONESUR", "PROTEINUR", "UROBILINOGEN", "NITRITE", "LEUKOCYTESUR" in the last 72 hours.  Invalid input(s): "APPERANCEUR"    Imaging: IR Fluoro Guide CV Line Right  Result Date: 11/27/2022 INDICATION: ESRD on peritoneal dialysis. Peritoneal catheter malfunction, requiring HD. EXAM: TUNNELED HEMODIALYSIS CATHETER PLACEMENT MEDICATIONS: Ancef 1 gm IV . The antibiotic was given in an appropriate time interval prior to skin puncture. ANESTHESIA/SEDATION: Moderate (conscious) sedation was employed during this procedure. A total of Versed 1 mg and Fentanyl 50 mcg was administered intravenously. Moderate Sedation Time: 32 minutes. The patient's level of consciousness and vital signs were monitored continuously by radiology nursing throughout the procedure under my direct supervision. FLUOROSCOPY TIME:  Fluoroscopic dose; 2.3 mGy COMPLICATIONS: None immediate. PROCEDURE: Informed written consent was obtained from the patient after a discussion of the risks, benefits, and  alternatives to treatment. Questions regarding the procedure were encouraged and answered. The RIGHT neck and chest were prepped with chlorhexidine in a sterile fashion, and a sterile drape was applied covering the operative field. Maximum barrier sterile technique with sterile gowns and gloves were used for the procedure. A timeout was performed prior to the initiation of the procedure. After creating a small venotomy incision, a micropuncture kit was utilized to access the internal jugular vein. Real-time ultrasound guidance was utilized for vascular access including the acquisition of a permanent ultrasound image documenting patency of the accessed vessel. The microwire was utilized to measure appropriate catheter length. A stiff Glidewire was advanced to the level of the IVC and the micropuncture sheath was exchanged for a peel-away sheath. A tunneled hemodialysis catheter measuring 19 cm from tip to cuff was tunneled in a retrograde fashion from the anterior chest wall to the venotomy incision. The catheter was then placed through the peel-away sheath with tips ultimately positioned within the superior aspect of the right atrium. Final catheter positioning was confirmed and documented with a spot radiographic image. The catheter aspirates and flushes normally. The catheter was flushed with appropriate volume heparin dwells. The catheter exit site was secured with a 2-0 Ethilon retention suture. The venotomy incision was closed with Dermabond. Dressings were applied. The patient tolerated the procedure well without immediate post procedural complication. IMPRESSION: Successful placement of 19 cm tip to cuff tunneled hemodialysis catheter via the RIGHT internal jugular vein. The tip of the catheter is positioned within the proximal RIGHT atrium. The catheter is ready for immediate use. Michaelle Birks, MD [facility] Radiology Electronically Signed   By: Michaelle Birks  M.D.   On: 11/27/2022 16:53   IR US Guide Vasc Access Right  Result Date: 11/27/2022 INDICATION: ESRD on peritoneal dialysis. Peritoneal catheter malfunction, requiring HD. EXAM: TUNNELED HEMODIALYSIS CATHETER PLACEMENT MEDICATIONS: Ancef 1 gm IV . The antibiotic was given in an appropriate time interval prior to skin puncture. ANESTHESIA/SEDATION: Moderate (conscious) sedation was employed during this procedure. A total of Versed 1 mg and Fentanyl 50 mcg was administered intravenously. Moderate Sedation Time: 32 minutes. The patient's level of consciousness and vital signs were monitored continuously by radiology nursing throughout the procedure under my direct supervision. FLUOROSCOPY TIME:  Fluoroscopic dose; 2.3 mGy COMPLICATIONS: None immediate. PROCEDURE: Informed written consent was obtained from the patient after a discussion of the risks, benefits, and alternatives to treatment. Questions regarding the procedure were encouraged and answered. The RIGHT neck and chest were prepped with chlorhexidine in a sterile fashion, and a sterile drape was applied covering the operative field. Maximum barrier sterile technique with sterile gowns and gloves were  used for the procedure. A timeout was performed prior to the initiation of the procedure. After creating a small venotomy incision, a micropuncture kit was utilized to access the internal jugular vein. Real-time ultrasound guidance was utilized for vascular access including the acquisition of a permanent ultrasound image documenting patency of the accessed vessel. The microwire was utilized to measure appropriate catheter length. A stiff Glidewire was advanced to the level of the IVC and the micropuncture sheath was exchanged for a peel-away sheath. A tunneled hemodialysis catheter measuring 19 cm from tip to cuff was tunneled in a retrograde fashion from the anterior chest wall to the venotomy incision. The catheter was then placed through the peel-away  sheath with tips ultimately positioned within the superior aspect of the right atrium. Final catheter positioning was confirmed and documented with a spot radiographic image. The catheter aspirates and flushes normally. The catheter was flushed with appropriate volume heparin dwells. The catheter exit site was secured with a 2-0 Ethilon retention suture. The venotomy incision was closed with Dermabond. Dressings were applied. The patient tolerated the procedure well without immediate post procedural complication. IMPRESSION: Successful placement of 19 cm tip to cuff tunneled hemodialysis catheter via the RIGHT internal jugular vein. The tip of the catheter is positioned within the proximal RIGHT atrium. The catheter is ready for immediate use. Michaelle Birks, MD [facility] Radiology Electronically Signed   By: Michaelle Birks M.D.   On: 11/27/2022 16:53     Medications:    anticoagulant sodium citrate      ceFAZolin (ANCEF) IV Stopped (11/27/22 1939)    ceFAZolin (ANCEF) IV     dialysis solution 1.5% low-MG/low-CA      amLODipine  10 mg Oral Daily   Chlorhexidine Gluconate Cloth  6 each Topical Q0600   gentamicin cream  1 Application Topical Daily   heparin  5,000 Units Subcutaneous Q8H   insulin aspart  0-5 Units Subcutaneous QHS   insulin aspart  0-9 Units Subcutaneous TID WC   insulin aspart  3 Units Subcutaneous TID WC   insulin glargine-yfgn  6 Units Subcutaneous Q24H   losartan  100 mg Oral Daily   montelukast  10 mg Oral QHS   multivitamin with minerals  1 tablet Oral Daily    Assessment/ Plan:     27 year old female with history of type 1 diabetes, end-stage renal disease on peritoneal dialysis, hypertension now admitted with history of left toe ulceration.  She had an MRI showing osteomyelitis.  She was found to have hypokalemia and had multiple potassium supplements.   ESRD on peritoneal dialysis: Patient has PD catheter malfunction.   Appreciate surgery evaluation and revision on 11/24/2022.  Due to PD catheter persistent malfunction, appreciate IR placing PermCath.  Patient will need outpatient antibiotics, currently arranged to receive with hemodialysis treatments.  Appreciate renal navigator confirming outpatient dialysis clinic at [facility] on a TTS schedule.  Patient notified and agreeable with this arrangement.   ANEMIA with chronic kidney disease: Patient does receive monthly Mircera at outpatient clinic.  Hemoglobin remained stable at this time.   Secondary Hyperparathyroidism: with outpatient labs: PTH 291, phosphorus 4.4, calcium 7.7 on 11/19/22.   Lab Results  Component Value Date   CALCIUM 8.4 (L) 11/28/2022   CAION 0.89 (LL) 07/16/2022   PHOS 4.4 11/26/2022  Will monitor bone minerals during this admission.   HTN/VOL with chronic kidney disease: Continue losartan and amlodipine.  Blood pressure 137/94 during dialysis.    ACCESS:  PD catheter has not been working well.  Surgery revision completed on 11/24/2022.Surgical note states fibrin clot found inside peritoneal dialysis catheter along with catheter outlet blocked by full bladder.  Patient encouraged to void prior to peritoneal dialysis treatments.   PD catheter continues to fail during drain session. Back up dialysis arranged via hemodialysis while PD catheter evaluated further.    Hypokalemia: Corrected     Acute osteomyelitis: Continue antibiotics with Rocephin, vancomycin and metronidazole. Surgery performed left great toe amputation on 11/24/2022.  Patient will require cefazolin with dialysis treatments, 2 g/2 g / 3 g until 12/21/2022. Outpatient dialysis clinic notified.  Diabetes mellitus, type 2 with chronic kidney disease: Continue insulin doses as ordered.   LOS: [facility] 1/13/202410:27 AM

## 2022-11-28 NOTE — Progress Notes (Signed)
PT Cancellation Note  Patient Details Name: Emma Thomas MRN: [id number] DOB: [birth year]   Cancelled Treatment:     PT attempt. Pt off floor. Will continue to follow per current POC. Pt has performed stairs and is cleared from an acute PT standpoint for safe DC home when deemed medically stable. Will return later this date if pt is not [place] home.     Willette Pa 11/28/2022, 8:46 AM

## 2022-11-28 NOTE — TOC CM/SW Note (Signed)
CSW spoke with Araceli Bouche with [facility], who confirmed pt is scheduled for Tues, Thurs, Saturday for dialysis, including IV meds. CSW faxed discharge summary to [place] at [phone], per request  CSW updated [place] with [place] of pt's discharge and that she will be administered IV meds by [place]  CSW spoke with patient, who confirmed receipt of crutches. Transportation home by spouse. No needs or concerns noted.

## 2022-11-28 NOTE — Progress Notes (Signed)
Progress Note   Patient: Emma Thomas AQT:[id number] DOB: Feb 18, 1996 DOA: 11/20/2022     8 DOS: the patient was seen and examined on 11/28/2022   Brief hospital course: 1/7.  Surgery canceled today secondary to hypokalemia.  Replacing potassium orally.  Patient also stated that her peritoneal dialysis has not been working well even at home. 1/8.  Dr. Cleda Mccreedy podiatry did a amputation of the left hallux with partial first ray resection and incision and drainage of abscess left forefoot.  Patient had nausea after procedure and given a dose of Phenergan. 1/9.  Dr. Hampton Abbot took to the operating room for laparoscopic peritoneal dialysis revision with removal of fibrin clot.  Patient had nausea after surgery and given a dose of Phenergan 1/10.  No events overnight.  General surgery cleared the patient for discharge.  Currently awaiting wound cultures from debridement for antibiotic selection, infectious disease following. 1/12 tunnel catheter placement by IR 1/13 started the patient on hemodialysis, IV antibiotics  Assessment and Plan: *27 year old female with history of type 1 diabetes mellitus, ESRD on PD, hypertension, anemia of chronic kidney disease presented to the emergency department on 11/20/2022 with complaints of left great toe being swollen.  X-ray of the foot showed cortical destruction of the distal first phalanx suspicious for osteomyelitis, medial forefoot soft tissue swelling without evidence of foreign body or soft tissue emphysema.  Podiatry is consulted, underwent amputation of the left hallux with a partial first ray resection and incision and drainage of the abscess of the left forefoot.  Cultures are positive for MRSA.  ID is following with the patient.  Patient also had malfunctioning of the peritoneal dialysis catheter, underwent revision by vascular surgery.  Still having problem with the peritoneal dialysis catheter.  Nephrology plan to place a tunneled catheter, plan to arrange  dialysis as an outpatient.  Patient will continue with antibiotics with the dialysis.   Acute osteomyelitis of the left first toe -S/p left first hallux and partial ray amputation with incision and drainage of the abscess on 1/8. -Cultures are positive for MRSA send-pathology is pending -ID, podiatry are following the patient   Nausea, vomiting -?  Gastroparesis -Resolved -Tolerating diet well.   Hypokalemia -Replaced, normalized.   PD catheter malfunction -S/p revision, removal of fibrin clot.   Type 1 diabetes mellitus -Continue with home regimen Lantus 6 units.   ESRD on PD -Nephrology is following with the patient.   Hyponatremia -Hypervolemic hyponatremia -Improved.   Anemia of chronic kidney disease -Get iron profile, B12, folate RBC, reticulocyte count documented in the correctable causes.   Subjective: Denies having any complaints.  Resting in the bed.  Patient's mother is at bedside Physical Exam: Vitals:   11/28/22 0333 11/28/22 0820 11/28/22 0845 11/28/22 0908  BP: (!) 144/88 (!) 142/93 (!) 150/97   Pulse: 94 97 95   Resp: 17  18   Temp: 98.4 F (36.9 C) 98 F (36.7 C) 98.7 F (37.1 C)   TempSrc: Oral Oral Oral   SpO2: 99% 100% 97%   Weight:    74.3 kg  Height:       Physical Exam HENT:     Head: Normocephalic.     Mouth/Throat:     Pharynx: No oropharyngeal exudate.  Eyes:     General: Lids are normal.     Conjunctiva/sclera: Conjunctivae normal.  Cardiovascular:     Rate and Rhythm: Normal rate and regular rhythm.     Heart sounds: Normal heart sounds, S1 normal and  S2 normal.  Pulmonary:     Breath sounds: No decreased breath sounds, wheezing, rhonchi or rales.  Abdominal:     Palpations: Abdomen is soft.     Tenderness: There is no abdominal tenderness.  Musculoskeletal:     Right lower leg: Swelling present.     Left lower leg: Swelling present.  Skin:    General: Skin is warm.     Comments: Status post left first toe amputation and  an pressure dressing  Neurological:     Mental Status: She is alert and oriented to person, place, and time.     Data Reviewed: Sodium 134, creatinine 6.97, hemoglobin 7.2, white blood cell count 8.9, platelet count 462  Disposition: Status is: Inpatient Remains inpatient appropriate because: Podiatry recommended ID consultation for potential of IV antibiotics.  Nephrology would like to use the peritoneal dialysis catheter starting tomorrow.  Planned Discharge Destination: Home    Time spent: 27 minutes  Author: Monica Becton, MD 11/28/2022 9:24 AM  For on call review [email or web address].

## 2022-11-28 NOTE — Progress Notes (Signed)
Pt hypoglycemic refusing meal tray for lunch because she is ready for discharge. Will adminiter graham crackers and juice.

## 2022-11-28 NOTE — Progress Notes (Signed)
Tolerated tx well  ran for 3 hrs with 0.5L fluid removal    11/28/22 1220  Vitals  Temp 98 F (36.7 C)  Temp Source Oral  BP (!) 149/99  MAP (mmHg) 114  BP Location Left Arm  BP Method Automatic  Patient Position (if appropriate) Lying  Pulse Rate 95  Pulse Rate Source Monitor  ECG Heart Rate 95  Resp 18  Oxygen Therapy  SpO2 98 %  O2 Device Room Air  Patient Activity (if Appropriate) In bed  Pulse Oximetry Type Continuous  During Treatment Monitoring  HD Safety Checks Performed Yes  Intra-Hemodialysis Comments Tolerated well;Tx completed  Post Treatment  Dialyzer Clearance Lightly streaked  Duration of HD Treatment -hour(s) 3 hour(s)  Hemodialysis Intake (mL) 100 mL (antibiotics given)  Liters Processed 72  Fluid Removed (mL) 500 mL  Tolerated HD Treatment Yes  Post-Hemodialysis Comments Tx completed. Tolerated well. No complications. Goal met.  Note  Observations TX ended  Hemodialysis Catheter Right Internal jugular Double lumen Permanent (Tunneled)  Placement Date/Time: 11/27/22 1633   Placed prior to admission: No  Serial / Lot #: [id number]  Expiration Date: 03/23/27  Time Out: Correct patient;Correct site;Correct procedure  Maximum sterile barrier precautions: Hand hygiene;Cap;Mask;Sterile go...  Site Condition No complications  Blue Lumen Status Heparin locked  Red Lumen Status Heparin locked  Purple Lumen Status N/A  Catheter fill solution Heparin 1000 units/ml  Catheter fill volume (Arterial) 1.6 cc  Catheter fill volume (Venous) 1.7  Dressing Type Transparent  Dressing Status Antimicrobial disc in place;Clean, Dry, Intact  Drainage Description None  Dressing Change Due 12/05/22  Post treatment catheter status Capped and Clamped

## 2022-11-28 NOTE — Inpatient Diabetes Management (Addendum)
Inpatient Diabetes Program Recommendations  AACE/ADA: New Consensus Statement on Inpatient Glycemic Control (2015)  Target Ranges:  Prepandial:   less than 140 mg/dL      Peak postprandial:   less than 180 mg/dL (1-2 hours)      Critically ill patients:  140 - 180 mg/dL    Latest Reference Range & Units 11/27/22 08:04 11/27/22 12:05 11/27/22 21:08 11/28/22 08:12  Glucose-Capillary 70 - 99 mg/dL 187 (H)  5 units Novolog  227 (H)  6 units Novolog  224 (H)  2 units Novolog  6 units Semglee 268 (H)  8 units Novolog   (H): Data is abnormally high    Diabetes history: DM1 (does NOT make insulin; requires basal, correction, and carbohydrate coverage insulin)     Home DM Meds: Tandem T-Slim insulin pump with Humalog Dexcom G6 CGM When not on pump Lantus 14 units daily, Humalog TID for meal coverage and correction (1 unit per 10 grams of carbs, 1 unit for every 50 mg/dl above 150 mg/dl)    Current Orders: Semglee 6 units Q24H     Novolog Sensitive Correction Scale/ SSI (0-9 units) TID AC + HS     Novolog 3 units TID with meals     MD- Note CBG 268 this AM  Please consider increasing the Semglee to 8 units Q24 hours   --Will follow patient during hospitalization--  Wyn Quaker RN, MSN, [place] Diabetes Coordinator Inpatient Glycemic Control Team Team Pager: [phone] (8a-5p)

## 2022-11-29 ENCOUNTER — Other Ambulatory Visit: Payer: Self-pay | Admitting: Internal Medicine

## 2022-11-29 LAB — GLUCOSE, CAPILLARY: Glucose-Capillary: 112 mg/dL — ABNORMAL HIGH (ref 70–99)

## 2022-11-29 MED ORDER — OXYCODONE-ACETAMINOPHEN 5-325 MG PO TABS: 1.0000 | ORAL_TABLET | Freq: Four times a day (QID) | ORAL | 0 refills | Status: AC | PRN

## 2022-11-30 LAB — HEPATITIS B E ANTIBODY: Hep B E Ab: NEGATIVE

## 2022-12-01 LAB — HEPATITIS B CORE ANTIBODY, TOTAL

## 2022-12-01 LAB — HEPATITIS B SURFACE ANTIGEN

## 2022-12-11 ENCOUNTER — Other Ambulatory Visit: Payer: Self-pay

## 2022-12-11 ENCOUNTER — Encounter: Payer: Self-pay | Admitting: Surgery

## 2022-12-11 ENCOUNTER — Ambulatory Visit (INDEPENDENT_AMBULATORY_CARE_PROVIDER_SITE_OTHER): Payer: Commercial Managed Care - PPO | Admitting: Surgery

## 2022-12-11 VITALS — BP 133/89 | HR 101 | Temp 99.1°F | Ht 66.0 in | Wt 160.0 lb

## 2022-12-11 DIAGNOSIS — T85611A Breakdown (mechanical) of intraperitoneal dialysis catheter, initial encounter: Secondary | ICD-10-CM

## 2022-12-11 DIAGNOSIS — N183 Chronic kidney disease, stage 3 unspecified: Secondary | ICD-10-CM | POA: Insufficient documentation

## 2022-12-11 NOTE — Progress Notes (Signed)
12/11/2022  History of Present Illness: Emma Thomas is a 27 y.o. female s/p laparoscopic PD catheter revision on 11/24/22 due to fibrin clot within the tubing, not allowing either infusion or drainage of dialysate.  Unfortunately post-operatively, her catheter continued having issues, though now more focused on drainage out.  She has tried position changes and making sure her bladder is empty before dialysis, but has not seen any improvement.  She's had to resort back to HD temporarily.  She is wondering what her options are going forwards.  Denies any abdominal pain, nausea, or vomiting.  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  Past Medical History: Past Medical History:  Diagnosis Date   Anemia    Diabetes mellitus without complication ([place])    Type 1 DM   Essential hypertension    Headache    Hypertension 03/04/2013   Neurologic disorder    Both feet   Recurrent UTI    Renal disorder      Past Surgical History: Past Surgical History:  Procedure Laterality Date   abscess removal     excision of bartholin cyst   AMPUTATION Left 11/23/2022   Procedure: LEFT GREAT TOE PARTIAL RAY AMPUTATION;  Surgeon: Sharlotte Alamo, DPM;  Location: [facility];  Service: Podiatry;  Laterality: Left;   CAPD INSERTION N/A 06/09/2022   Procedure: LAPAROSCOPIC INSERTION CONTINUOUS AMBULATORY PERITONEAL DIALYSIS  (CAPD) CATHETER, PD rep to be present;  Surgeon: Olean Ree, MD;  Location: [facility];  Service: General;  Laterality: N/A;   CAPD REVISION N/A 07/16/2022   Procedure: LAPAROSCOPIC REVISION CONTINUOUS AMBULATORY PERITONEAL DIALYSIS  (CAPD) CATHETER;  Surgeon: Olean Ree, MD;  Location: [facility];  Service: General;  Laterality: N/A;   CAPD REVISION N/A 11/24/2022   Procedure: LAPAROSCOPIC REVISION CONTINUOUS AMBULATORY PERITONEAL DIALYSIS  (CAPD) CATHETER;  Surgeon: Olean Ree, MD;  Location: [facility];  Service: General;  Laterality: N/A;   DIALYSIS/PERMA CATHETER INSERTION N/A  04/24/2022   Procedure: DIALYSIS/PERMA CATHETER INSERTION;  Surgeon: Katha Cabal, MD;  Location: [facility];  Service: Cardiovascular;  Laterality: N/A;   DIALYSIS/PERMA CATHETER REMOVAL N/A 06/02/2022   Procedure: DIALYSIS/PERMA CATHETER REMOVAL;  Surgeon: Katha Cabal, MD;  Location: [facility];  Service: Cardiovascular;  Laterality: N/A;   INSERTION OF DIALYSIS CATHETER Right    ARM   IR FLUORO GUIDE CV LINE RIGHT  11/27/2022   IR US GUIDE VASC ACCESS RIGHT  11/27/2022   Nexplanon  [id number]   UMBILICAL HERNIA REPAIR N/A 06/09/2022   Procedure: HERNIA REPAIR UMBILICAL ADULT;  Surgeon: Olean Ree, MD;  Location: [facility];  Service: General;  Laterality: N/A;    Home Medications: Prior to Admission medications   Medication Sig Start Date End Date Taking? Authorizing Provider  acetaminophen (TYLENOL) 500 MG tablet Take 2 tablets (1,000 mg total) by mouth every 6 (six) hours as needed for mild pain. 07/16/22  Yes Shastina Rua, Jacqulyn Bath, MD  amLODipine (NORVASC) 10 MG tablet Take 1 tablet (10 mg total) by mouth daily. 04/28/22  Yes Jennye Boroughs, MD  atorvastatin (LIPITOR) 40 MG tablet Take 80 mg by mouth daily. 01/30/22  Yes [provider]  bumetanide (BUMEX) 1 MG tablet Take 1 mg by mouth 2 (two) times daily. 07/13/22  Yes [provider]  ceFAZolin (ANCEF) 1-4 GM/50ML-% SOLN Inject 50 mLs (1 g total) into the vein daily. 11/28/22  Yes Vasireddy, Grier Mitts, MD  ferrous gluconate (FERGON) 324 MG tablet Take 324 mg by mouth daily with  breakfast. 05/03/20  Yes [provider]  furosemide (LASIX) 80 MG tablet Take 80 mg by mouth daily. 08/10/22  Yes [provider]  HUMALOG 100 UNIT/ML injection Inject into the skin. 12/04/22  Yes [provider]  insulin lispro (HUMALOG) 100 UNIT/ML injection Inject 5-10 Units into the skin 3 (three) times daily before meals. Plus sliding scale 1 unit per 50 > 150   Yes [provider]  JUNEL  1/20 1-20 MG-MCG tablet Take 1 tablet by mouth daily. 04/17/22  Yes [provider]  losartan (COZAAR) 100 MG tablet Take 100 mg by mouth daily. 06/04/22  Yes [provider]  montelukast (SINGULAIR) 10 MG tablet Take 10 mg by mouth daily. 04/19/22  Yes [provider]  multivitamin (RENA-VIT) TABS tablet Take 1 tablet by mouth daily. 10/30/22  Yes [provider]  oxyCODONE-acetaminophen (PERCOCET/ROXICET) 5-325 MG tablet Take by mouth. 12/08/22  Yes [provider]  polyethylene glycol (MIRALAX / GLYCOLAX) 17 g packet Take 17 g by mouth daily as needed for mild constipation. 11/28/22  Yes Vasireddy, Grier Mitts, MD  SUMAtriptan (IMITREX) 50 MG tablet Take 50 mg by mouth daily as needed for migraine. 11/11/21  Yes [provider]  Vitamin D, Ergocalciferol, (DRISDOL) 1.25 MG (50000 UNIT) CAPS capsule Take 50,000 Units by mouth once a week. 02/05/22  Yes [provider]    Allergies: No Known Allergies  Review of Systems: Review of Systems  Constitutional:  Negative for chills and fever.  Respiratory:  Negative for shortness of breath.   Cardiovascular:  Negative for chest pain.  Gastrointestinal:  Negative for nausea and vomiting.  Genitourinary:  Negative for dysuria.  Musculoskeletal:  Negative for myalgias.    Physical Exam BP 133/89   Pulse (!) 101   Temp 99.1 F (37.3 C) (Oral)   Ht 5\' 6"  (1.676 m)   Wt 160 lb (72.6 kg)   LMP 11/11/2022 (Exact Date)   SpO2 99%   BMI 25.82 kg/m  CONSTITUTIONAL: No acute distress, well nourished. HEENT:  Normocephalic, atraumatic, extraocular motion intact. RESPIRATORY:  Normal respiratory effort without pathologic use of accessory muscles. CARDIOVASCULAR: Regular rhythm and rate. GI: The abdomen is soft, non-distended, non-tender.  PD catheter in place without any evidence of infection.  Incisions healing well.  NEUROLOGIC:  Motor and sensation is grossly normal.  Cranial nerves are  grossly intact. PSYCH:  Alert and oriented to person, place and time. Affect is normal.   Assessment and Plan: This is a 27 y.o. female with malfunctioning PD catheter  --The PD catheter continue to malfunction despite of revision and fibrin clot removal on 11/24/22.  Although this could be positional, discussed with the patient that it is unclear the full etiology, anatomy vs mechanical.  The patient is wondering if she needs to transition to HD or if there are other options for management.  Discussed with her that potentially we can place a new PD catheter going from the left side instead and removing the prior catheter.  Although I could not guarantee that this would not have any issues, at least would be the next step prior to exhausting options.  She's in agreement with this. --Discussed with her then the plan for a laparoscopic assisted PD catheter removal and placement of a new left sided PD catheter.  Reviewed the surgery at length with her including the planned incisions, risks of bleeding, infection, injury to surrounding structures, that this would be an outpatient surgery, post-op pain control, and she's  willing to proceed. --Will schedule her for 12/15/22.  I spent 30 minutes dedicated to the care of this patient on the date of this encounter to include pre-visit review of records, face-to-face time with the patient discussing diagnosis and management, and any post-visit coordination of care.   Melvyn Neth, [facility]

## 2022-12-11 NOTE — H&P (View-Only) (Signed)
12/11/2022  History of Present Illness: Emma Thomas is a 27 y.o. female s/p laparoscopic PD catheter revision on 11/24/22 due to fibrin clot within the tubing, not allowing either infusion or drainage of dialysate.  Unfortunately post-operatively, her catheter continued having issues, though now more focused on drainage out.  She has tried position changes and making sure her bladder is empty before dialysis, but has not seen any improvement.  She's had to resort back to HD temporarily.  She is wondering what her options are going forwards.  Denies any abdominal pain, nausea, or vomiting.  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  Past Medical History: Past Medical History:  Diagnosis Date   Anemia    Diabetes mellitus without complication ([place])    Type 1 DM   Essential hypertension    Headache    Hypertension 03/04/2013   Neurologic disorder    Both feet   Recurrent UTI    Renal disorder      Past Surgical History: Past Surgical History:  Procedure Laterality Date   abscess removal     excision of bartholin cyst   AMPUTATION Left 11/23/2022   Procedure: LEFT GREAT TOE PARTIAL RAY AMPUTATION;  Surgeon: Sharlotte Alamo, DPM;  Location: [facility];  Service: Podiatry;  Laterality: Left;   CAPD INSERTION N/A 06/09/2022   Procedure: LAPAROSCOPIC INSERTION CONTINUOUS AMBULATORY PERITONEAL DIALYSIS  (CAPD) CATHETER, PD rep to be present;  Surgeon: Olean Ree, MD;  Location: [facility];  Service: General;  Laterality: N/A;   CAPD REVISION N/A 07/16/2022   Procedure: LAPAROSCOPIC REVISION CONTINUOUS AMBULATORY PERITONEAL DIALYSIS  (CAPD) CATHETER;  Surgeon: Olean Ree, MD;  Location: [facility];  Service: General;  Laterality: N/A;   CAPD REVISION N/A 11/24/2022   Procedure: LAPAROSCOPIC REVISION CONTINUOUS AMBULATORY PERITONEAL DIALYSIS  (CAPD) CATHETER;  Surgeon: Olean Ree, MD;  Location: [facility];  Service: General;  Laterality: N/A;   DIALYSIS/PERMA CATHETER INSERTION N/A  04/24/2022   Procedure: DIALYSIS/PERMA CATHETER INSERTION;  Surgeon: Katha Cabal, MD;  Location: [facility];  Service: Cardiovascular;  Laterality: N/A;   DIALYSIS/PERMA CATHETER REMOVAL N/A 06/02/2022   Procedure: DIALYSIS/PERMA CATHETER REMOVAL;  Surgeon: Katha Cabal, MD;  Location: [facility];  Service: Cardiovascular;  Laterality: N/A;   INSERTION OF DIALYSIS CATHETER Right    ARM   IR FLUORO GUIDE CV LINE RIGHT  11/27/2022   IR US GUIDE VASC ACCESS RIGHT  11/27/2022   Nexplanon  11/[id number]   UMBILICAL HERNIA REPAIR N/A 06/09/2022   Procedure: HERNIA REPAIR UMBILICAL ADULT;  Surgeon: Olean Ree, MD;  Location: [facility];  Service: General;  Laterality: N/A;    Home Medications: Prior to Admission medications   Medication Sig Start Date End Date Taking? Authorizing Provider  acetaminophen (TYLENOL) 500 MG tablet Take 2 tablets (1,000 mg total) by mouth every 6 (six) hours as needed for mild pain. 07/16/22  Yes Ambrea Hegler, Jacqulyn Bath, MD  amLODipine (NORVASC) 10 MG tablet Take 1 tablet (10 mg total) by mouth daily. 04/28/22  Yes Jennye Boroughs, MD  atorvastatin (LIPITOR) 40 MG tablet Take 80 mg by mouth daily. 01/30/22  Yes [provider]  bumetanide (BUMEX) 1 MG tablet Take 1 mg by mouth 2 (two) times daily. 07/13/22  Yes [provider]  ceFAZolin (ANCEF) 1-4 GM/50ML-% SOLN Inject 50 mLs (1 g total) into the vein daily. 11/28/22  Yes Vasireddy, Grier Mitts, MD  ferrous gluconate (FERGON) 324 MG tablet Take 324 mg by mouth daily with  breakfast. 05/03/20  Yes [provider]  furosemide (LASIX) 80 MG tablet Take 80 mg by mouth daily. 08/10/22  Yes [provider]  HUMALOG 100 UNIT/ML injection Inject into the skin. 12/04/22  Yes [provider]  insulin lispro (HUMALOG) 100 UNIT/ML injection Inject 5-10 Units into the skin 3 (three) times daily before meals. Plus sliding scale 1 unit per 50 > 150   Yes [provider]  JUNEL  1/20 1-20 MG-MCG tablet Take 1 tablet by mouth daily. 04/17/22  Yes [provider]  losartan (COZAAR) 100 MG tablet Take 100 mg by mouth daily. 06/04/22  Yes [provider]  montelukast (SINGULAIR) 10 MG tablet Take 10 mg by mouth daily. 04/19/22  Yes [provider]  multivitamin (RENA-VIT) TABS tablet Take 1 tablet by mouth daily. 10/30/22  Yes [provider]  oxyCODONE-acetaminophen (PERCOCET/ROXICET) 5-325 MG tablet Take by mouth. 12/08/22  Yes [provider]  polyethylene glycol (MIRALAX / GLYCOLAX) 17 g packet Take 17 g by mouth daily as needed for mild constipation. 11/28/22  Yes Vasireddy, Grier Mitts, MD  SUMAtriptan (IMITREX) 50 MG tablet Take 50 mg by mouth daily as needed for migraine. 11/11/21  Yes [provider]  Vitamin D, Ergocalciferol, (DRISDOL) 1.25 MG (50000 UNIT) CAPS capsule Take 50,000 Units by mouth once a week. 02/05/22  Yes [provider]    Allergies: No Known Allergies  Review of Systems: Review of Systems  Constitutional:  Negative for chills and fever.  Respiratory:  Negative for shortness of breath.   Cardiovascular:  Negative for chest pain.  Gastrointestinal:  Negative for nausea and vomiting.  Genitourinary:  Negative for dysuria.  Musculoskeletal:  Negative for myalgias.    Physical Exam BP 133/89   Pulse (!) 101   Temp 99.1 F (37.3 C) (Oral)   Ht 5\' 6"  (1.676 m)   Wt 160 lb (72.6 kg)   LMP 11/11/2022 (Exact Date)   SpO2 99%   BMI 25.82 kg/m  CONSTITUTIONAL: No acute distress, well nourished. HEENT:  Normocephalic, atraumatic, extraocular motion intact. RESPIRATORY:  Normal respiratory effort without pathologic use of accessory muscles. CARDIOVASCULAR: Regular rhythm and rate. GI: The abdomen is soft, non-distended, non-tender.  PD catheter in place without any evidence of infection.  Incisions healing well.  NEUROLOGIC:  Motor and sensation is grossly normal.  Cranial nerves are  grossly intact. PSYCH:  Alert and oriented to person, place and time. Affect is normal.   Assessment and Plan: This is a 27 y.o. female with malfunctioning PD catheter  --The PD catheter continue to malfunction despite of revision and fibrin clot removal on 11/24/22.  Although this could be positional, discussed with the patient that it is unclear the full etiology, anatomy vs mechanical.  The patient is wondering if she needs to transition to HD or if there are other options for management.  Discussed with her that potentially we can place a new PD catheter going from the left side instead and removing the prior catheter.  Although I could not guarantee that this would not have any issues, at least would be the next step prior to exhausting options.  She's in agreement with this. --Discussed with her then the plan for a laparoscopic assisted PD catheter removal and placement of a new left sided PD catheter.  Reviewed the surgery at length with her including the planned incisions, risks of bleeding, infection, injury to surrounding structures, that this would be an outpatient surgery, post-op pain control, and she's  willing to proceed. --Will schedule her for 12/15/22.  I spent 30 minutes dedicated to the care of this patient on the date of this encounter to include pre-visit review of records, face-to-face time with the patient discussing diagnosis and management, and any post-visit coordination of care.   Melvyn Neth, [facility]

## 2022-12-11 NOTE — Patient Instructions (Signed)
Our surgery scheduler will call you within 24-48 hours to schedule your surgery. Please have the [facility] surgery sheet available when speaking with her.   Peritoneal Dialysis Catheter Placement  Peritoneal dialysis catheter placement is a surgery to insert a thin, flexible tube (catheter) into the abdomen. The catheter will be used for peritoneal dialysis, which is a process for filtering the blood. The catheter is small, soft, and easy to conceal. The catheter placement is usually done at least 2 weeks before peritoneal dialysis is started. During dialysis, wastes, salt, and extra water are removed from the blood. In peritoneal dialysis, these tasks are performed by transferring a fluid (dialysate) to and from the abdomen during each session. The fluid goes through the catheter to enter the abdomen at the start of each dialysis session, and it drains out of the body through the catheter at the end of each session. This procedure is done using one of the following techniques: Open technique. This is when the surgery is performed through one large incision. Laparoscopic technique. This is when smaller incisions are made and a tube with a light and camera (laparoscope) is inserted through one of the incisions to help perform the surgery. The camera sends images to a video screen in the operating room. This lets the surgeon see inside the abdomen during the procedure. Tell a health care provider about: Any allergies you have. All medicines you are taking, including vitamins, herbs, eye drops, creams, and over-the-counter medicines. Any problems you or family members have had with anesthetic medicines. Any blood disorders you have. Any surgeries you have had. Any history of smoking. Any medical conditions you have. Whether you are pregnant or may be pregnant. What are the risks? Generally, this is a safe procedure. However, problems may occur, including: Infection. Too much bleeding. A collection of  blood near the incision (hematoma). Damage to blood vessels, tissues, or organs in the abdomen area. Allergic reactions to medicines. Pain or cramping. Slow healing. Catheter problems after the surgery. The catheter may: Become blocked. Move out of place. Poke or wrap around intestines. Allow fluid to leak around it. Scarring. Skin damage. What happens before the procedure? Staying hydrated Follow instructions from your health care provider about hydration, which may include: Up to 2 hours before the procedure - you may continue to drink clear liquids, such as water, clear fruit juice, black coffee, and plain tea.  Eating and drinking restrictions Follow instructions from your health care provider about eating and drinking, which may include: 8 hours before the procedure - stop eating heavy meals or foods, such as meat, fried foods, or fatty foods. 6 hours before the procedure - stop eating light meals or foods, such as toast or cereal. 6 hours before the procedure - stop drinking milk or drinks that contain milk. 2 hours before the procedure - stop drinking clear liquids. Medicines Ask your health care provider about: Changing or stopping your regular medicines. This is especially important if you are taking diabetes medicines or blood thinners. Taking medicines such as aspirin and ibuprofen. These medicines can thin your blood. Do not take these medicines unless your health care provider tells you to take them. Taking over-the-counter medicines, vitamins, herbs, and supplements. Surgery safety Ask your health care provider: How your surgery site will be marked. What steps will be taken to help prevent infection. These steps may include: Removing hair at the surgery site. Washing skin with a germ-killing soap. Taking antibiotic medicine. General instructions You may have  a CT scan or ultrasound of your abdomen. You may have a blood sample taken. Plan to have a responsible adult  take you home from the hospital or clinic. Your health care provider will discuss the best site for the catheter to be placed. The site will be chosen: To help prevent the catheter from being flattened or damaged. To make it as comfortable as possible for you. What happens during the procedure? An IV will be inserted into one of your veins. You will be given one or both of the following: A medicine to help you relax (sedative). A medicine to numb the area (local anesthetic). If you are having an open surgery, one large incision will be made in the abdomen. If you are having laparoscopic surgery, small incisions will be made in the abdomen. A laparoscope and instruments will be put through the incisions. The catheter will be put in place. A short tunnel will be made under the skin to a location where the catheter exits the abdomen. Stitches (sutures) will be placed around the catheter to hold it in place. Your incisions will be closed with sutures or staples. The procedure may vary among health care providers and hospitals. What happens after the procedure? Your blood pressure, heart rate, breathing rate, and blood oxygen level will be monitored until you leave the hospital or clinic. You may have some pain. You will be given pain medicine as needed. You will be given instructions about how to care for your catheter and how it is used for the dialysis process. Summary Peritoneal dialysis catheter placement is a surgery to insert a thin, flexible tube (catheter) in your abdomen. This surgery must be done before you begin peritoneal dialysis. Before the procedure, your health care provider will discuss the best site for the catheter to be placed. The site will be chosen to help prevent the catheter from being flattened or damaged, and to make it as comfortable as possible for you. After the procedure, you will be given instructions about how to care for your catheter and how it is used for the  dialysis process. This information is not intended to replace advice given to you by your health care provider. Make sure you discuss any questions you have with your health care provider. Document Revised: 06/20/2020 Document Reviewed: 06/20/2020 Elsevier Patient Education  [place].

## 2022-12-14 ENCOUNTER — Telehealth: Payer: Self-pay | Admitting: Surgery

## 2022-12-14 ENCOUNTER — Other Ambulatory Visit: Payer: Self-pay

## 2022-12-14 ENCOUNTER — Encounter
Admission: RE | Admit: 2022-12-14 | Discharge: 2022-12-14 | Disposition: A | Discharge status: Home / Self Care | Payer: Commercial Managed Care - PPO | Source: Ambulatory Visit | Attending: Surgery | Admitting: Surgery

## 2022-12-14 DIAGNOSIS — Z01812 Encounter for preprocedural laboratory examination: Secondary | ICD-10-CM

## 2022-12-14 NOTE — Patient Instructions (Addendum)
Your procedure is scheduled on: 12/15/22 - Tuesday Report to the Registration Desk on the 1st floor of the [facility]. To find out your arrival time, please call [phone] between 1PM - 3PM on: 12/14/22 - Monday If your arrival time is 6:00 am, do not arrive prior to that time as the [facility] entrance doors do not open until 6:00 am.  REMEMBER: Instructions that are not followed completely may result in serious medical risk, up to and including death; or upon the discretion of your surgeon and anesthesiologist your surgery may need to be rescheduled.  Do not eat food after midnight the night before surgery.  No gum chewing, lozengers or hard candies.  You may however, drink CLEAR liquids up to 2 hours before you are scheduled to arrive for your surgery. Do not drink anything within 2 hours of your scheduled arrival time.  Clear liquids include: - water   Type 1 and Type 2 diabetics should only drink water.  TAKE THESE MEDICATIONS THE MORNING OF SURGERY WITH A SIP OF WATER:  - amLODipine (NORVASC)   HOLD insulin lispro (HUMALOG) on the morning of surgery, may resume with meals.  One week prior to surgery: Stop Anti-inflammatories (NSAIDS) such as Advil, Aleve, Ibuprofen, Motrin, Naproxen, Naprosyn and Aspirin based products such as Excedrin, Goodys Powder, BC Powder.  Stop ANY OVER THE COUNTER supplements until after surgery.  You may however, continue to take Tylenol if needed for pain up until the day of surgery.  No Alcohol for 24 hours before or after surgery.  No Smoking including e-cigarettes for 24 hours prior to surgery.  No chewable tobacco products for at least 6 hours prior to surgery.  No nicotine patches on the day of surgery.  Do not use any "recreational" drugs for at least a week prior to your surgery.  Please be advised that the combination of cocaine and anesthesia may have negative outcomes, up to and including death. If you test positive for  cocaine, your surgery will be cancelled.  On the morning of surgery brush your teeth with toothpaste and water, you may rinse your mouth with mouthwash if you wish. Do not swallow any toothpaste or mouthwash.  Use CHG Soap or wipes as directed on instruction sheet.  Do not wear jewelry, make-up, hairpins, clips or nail polish.  Do not wear lotions, powders, or perfumes.   Do not shave body from the neck down 48 hours prior to surgery just in case you cut yourself which could leave a site for infection. Also, freshly shaved skin may become irritated if using the CHG soap.  Contact lenses, hearing aids and dentures may not be worn into surgery.  Do not bring valuables to the hospital. [facility] is not responsible for any missing/lost belongings or valuables.   Notify your doctor if there is any change in your medical condition (cold, fever, infection).  Wear comfortable clothing (specific to your surgery type) to the hospital.  After surgery, you can help prevent lung complications by doing breathing exercises.  Take deep breaths and cough every 1-2 hours. Your doctor may order a device called an Incentive Spirometer to help you take deep breaths. When coughing or sneezing, hold a pillow firmly against your incision with both hands. This is called "splinting." Doing this helps protect your incision. It also decreases belly discomfort.  If you are being admitted to the hospital overnight, leave your suitcase in the car. After surgery it may be brought to your  room.  If you are being discharged the day of surgery, you will not be allowed to drive home. You will need a responsible adult (18 years or older) to drive you home and stay with you that night.   If you are taking public transportation, you will need to have a responsible adult (18 years or older) with you. Please confirm with your physician that it is acceptable to use public transportation.   Please call the [place] Dept. at [phone] if you have any questions about these instructions.  Surgery Visitation Policy:  Patients undergoing a surgery or procedure may have two family members or support persons with them as long as the person is not COVID-19 positive or experiencing its symptoms.   Inpatient Visitation:    Visiting hours are 7 a.m. to 8 p.m. Up to four visitors are allowed at one time in a patient room. The visitors may rotate out with other people during the day. One designated support person (adult) may remain overnight.  Due to an increase in RSV and influenza rates and associated hospitalizations, children ages 70 and under will not be able to visit patients in [facility]. Masks continue to be strongly recommended.

## 2022-12-14 NOTE — Progress Notes (Signed)
Patient with Tandem Pump fpr Type 1 DM, patient reports that her pump is off today and she plans to keep it off until after her procedure, discussed with patient off pump plan given to her by her endocrinologist: 14 u lantus in AM and 1:10 IC ratio plus SSI as 1 u for every 50 >150 , she understands that she will decrease her 14 units of Lantus by 20% = 12 units and will follow her SSI as instructed by her endocrinologist, she has been instructed that she can place her pump on her hips, and upper legs. She voices understanding and has been instructed to call with any questions or concerns, Lauren, Diabetic Coordinator at [facility] was made aware and assisted this Probation officer in providing information to the patient.

## 2022-12-14 NOTE — Telephone Encounter (Signed)
Patient has been advised of Pre-Admission date/time, and Surgery date at [facility].  Surgery Date: 12/15/22 Preadmission Testing Date: 12/14/22 (phone 8a-1p)  Patient has been made aware to call [phone], between 1-3:00pm the day before surgery, to find out what time to arrive for surgery.

## 2022-12-15 ENCOUNTER — Other Ambulatory Visit: Payer: Self-pay

## 2022-12-15 ENCOUNTER — Ambulatory Visit: Payer: Commercial Managed Care - PPO | Admitting: Certified Registered"

## 2022-12-15 ENCOUNTER — Ambulatory Visit
Admission: RE | Admit: 2022-12-15 | Discharge: 2022-12-15 | Disposition: A | Discharge status: Home / Self Care | Payer: Commercial Managed Care - PPO | Attending: Surgery | Admitting: Surgery

## 2022-12-15 ENCOUNTER — Encounter: Payer: Self-pay | Admitting: Surgery

## 2022-12-15 ENCOUNTER — Encounter
Admission: RE | Disposition: A | Discharge status: Home / Self Care | Payer: Self-pay | Source: Home / Self Care | Attending: Surgery

## 2022-12-15 DIAGNOSIS — Z87440 Personal history of urinary (tract) infections: Secondary | ICD-10-CM | POA: Diagnosis not present

## 2022-12-15 DIAGNOSIS — Z794 Long term (current) use of insulin: Secondary | ICD-10-CM | POA: Diagnosis not present

## 2022-12-15 DIAGNOSIS — T85611A Breakdown (mechanical) of intraperitoneal dialysis catheter, initial encounter: Secondary | ICD-10-CM | POA: Insufficient documentation

## 2022-12-15 DIAGNOSIS — E1022 Type 1 diabetes mellitus with diabetic chronic kidney disease: Secondary | ICD-10-CM | POA: Insufficient documentation

## 2022-12-15 DIAGNOSIS — Z992 Dependence on renal dialysis: Secondary | ICD-10-CM | POA: Diagnosis not present

## 2022-12-15 DIAGNOSIS — Z01812 Encounter for preprocedural laboratory examination: Secondary | ICD-10-CM

## 2022-12-15 DIAGNOSIS — I129 Hypertensive chronic kidney disease with stage 1 through stage 4 chronic kidney disease, or unspecified chronic kidney disease: Secondary | ICD-10-CM | POA: Diagnosis not present

## 2022-12-15 DIAGNOSIS — N189 Chronic kidney disease, unspecified: Secondary | ICD-10-CM | POA: Insufficient documentation

## 2022-12-15 DIAGNOSIS — X58XXXA Exposure to other specified factors, initial encounter: Secondary | ICD-10-CM | POA: Diagnosis not present

## 2022-12-15 DIAGNOSIS — Z79899 Other long term (current) drug therapy: Secondary | ICD-10-CM | POA: Insufficient documentation

## 2022-12-15 HISTORY — PX: CAPD INSERTION: SHX5233

## 2022-12-15 HISTORY — PX: CAPD REMOVAL: SHX5234

## 2022-12-15 LAB — GLUCOSE, CAPILLARY
Glucose-Capillary: 152 mg/dL — ABNORMAL HIGH (ref 70–99)
Glucose-Capillary: 159 mg/dL — ABNORMAL HIGH (ref 70–99)

## 2022-12-15 LAB — POCT I-STAT, CHEM 8
BUN: 10 mg/dL (ref 6–20)
Calcium, Ion: 0.98 mmol/L — ABNORMAL LOW (ref 1.15–1.40)
Chloride: 96 mmol/L — ABNORMAL LOW (ref 98–111)
Creatinine, Ser: 4 mg/dL — ABNORMAL HIGH (ref 0.44–1.00)
Glucose, Bld: 164 mg/dL — ABNORMAL HIGH (ref 70–99)
HCT: 29 % — ABNORMAL LOW (ref 36.0–46.0)
Hemoglobin: 9.9 g/dL — ABNORMAL LOW (ref 12.0–15.0)
Potassium: 3.3 mmol/L — ABNORMAL LOW (ref 3.5–5.1)
Sodium: 135 mmol/L (ref 135–145)
TCO2: 28 mmol/L (ref 22–32)

## 2022-12-15 LAB — POCT PREGNANCY, URINE: Preg Test, Ur: NEGATIVE

## 2022-12-15 SURGERY — LAPAROSCOPIC INSERTION CONTINUOUS AMBULATORY PERITONEAL DIALYSIS  (CAPD) CATHETER
Anesthesia: General

## 2022-12-15 MED ORDER — PHENYLEPHRINE 80 MCG/ML (10ML) SYRINGE FOR IV PUSH (FOR BLOOD PRESSURE SUPPORT)
PREFILLED_SYRINGE | INTRAVENOUS | Status: DC | PRN
  Administered 2022-12-15: 80 ug via INTRAVENOUS

## 2022-12-15 MED ORDER — FENTANYL CITRATE (PF) 100 MCG/2ML IJ SOLN
INTRAMUSCULAR | Status: AC
  Filled 2022-12-15: qty 2

## 2022-12-15 MED ORDER — OXYCODONE HCL 5 MG/5ML PO SOLN: 5.0000 mg | Freq: Once | ORAL | Status: AC | PRN

## 2022-12-15 MED ORDER — LIDOCAINE HCL (CARDIAC) PF 100 MG/5ML IV SOSY
PREFILLED_SYRINGE | INTRAVENOUS | Status: DC | PRN
  Administered 2022-12-15: 60 mg via INTRAVENOUS

## 2022-12-15 MED ORDER — BUPIVACAINE-EPINEPHRINE 0.5% -1:200000 IJ SOLN
INTRAMUSCULAR | Status: DC | PRN
  Administered 2022-12-15: 30 mL

## 2022-12-15 MED ORDER — ONDANSETRON HCL 4 MG/2ML IJ SOLN
INTRAMUSCULAR | Status: DC | PRN
  Administered 2022-12-15: 4 mg via INTRAVENOUS

## 2022-12-15 MED ORDER — BUPIVACAINE LIPOSOME 1.3 % IJ SUSP
INTRAMUSCULAR | Status: DC | PRN
  Administered 2022-12-15: 10 mL

## 2022-12-15 MED ORDER — ROCURONIUM BROMIDE 100 MG/10ML IV SOLN
INTRAVENOUS | Status: DC | PRN
  Administered 2022-12-15: 50 mg via INTRAVENOUS

## 2022-12-15 MED ORDER — CEFAZOLIN SODIUM-DEXTROSE 2-4 GM/100ML-% IV SOLN
2.0000 g | INTRAVENOUS | Status: AC
  Administered 2022-12-15: 2 g via INTRAVENOUS

## 2022-12-15 MED ORDER — BUPIVACAINE LIPOSOME 1.3 % IJ SUSP
INTRAMUSCULAR | Status: AC
  Filled 2022-12-15: qty 10

## 2022-12-15 MED ORDER — GABAPENTIN 300 MG PO CAPS
ORAL_CAPSULE | ORAL | Status: AC
  Administered 2022-12-15: 300 mg via ORAL
  Filled 2022-12-15: qty 1

## 2022-12-15 MED ORDER — ACETAMINOPHEN 500 MG PO TABS
ORAL_TABLET | ORAL | Status: AC
  Administered 2022-12-15: 1000 mg via ORAL
  Filled 2022-12-15: qty 2

## 2022-12-15 MED ORDER — FENTANYL CITRATE (PF) 100 MCG/2ML IJ SOLN
25.0000 ug | INTRAMUSCULAR | Status: DC | PRN
  Administered 2022-12-15 (×2): 25 ug via INTRAVENOUS
  Administered 2022-12-15: 50 ug via INTRAVENOUS
  Administered 2022-12-15: 25 ug via INTRAVENOUS
  Administered 2022-12-15: 50 ug via INTRAVENOUS

## 2022-12-15 MED ORDER — OXYCODONE HCL 5 MG PO TABS
5.0000 mg | ORAL_TABLET | Freq: Once | ORAL | Status: AC | PRN
  Administered 2022-12-15: 5 mg via ORAL

## 2022-12-15 MED ORDER — KETAMINE HCL 50 MG/5ML IJ SOSY
PREFILLED_SYRINGE | INTRAMUSCULAR | Status: AC
  Filled 2022-12-15: qty 5

## 2022-12-15 MED ORDER — ROCURONIUM BROMIDE 10 MG/ML (PF) SYRINGE
PREFILLED_SYRINGE | INTRAVENOUS | Status: AC
  Filled 2022-12-15: qty 10

## 2022-12-15 MED ORDER — FENTANYL CITRATE (PF) 100 MCG/2ML IJ SOLN
INTRAMUSCULAR | Status: DC | PRN
  Administered 2022-12-15: 50 ug via INTRAVENOUS
  Administered 2022-12-15 (×2): 25 ug via INTRAVENOUS

## 2022-12-15 MED ORDER — BUPIVACAINE-EPINEPHRINE (PF) 0.5% -1:200000 IJ SOLN
INTRAMUSCULAR | Status: AC
  Filled 2022-12-15: qty 30

## 2022-12-15 MED ORDER — PHENYLEPHRINE HCL-NACL 20-0.9 MG/250ML-% IV SOLN
INTRAVENOUS | Status: DC | PRN
  Administered 2022-12-15: 30 ug/min via INTRAVENOUS

## 2022-12-15 MED ORDER — HEPARIN SODIUM (PORCINE) 5000 UNIT/ML IJ SOLN
INTRAMUSCULAR | Status: AC
  Filled 2022-12-15: qty 1

## 2022-12-15 MED ORDER — CHLORHEXIDINE GLUCONATE 0.12 % MT SOLN: 15.0000 mL | Freq: Once | OROMUCOSAL | Status: AC

## 2022-12-15 MED ORDER — HEPARIN 5000 UNITS IN NS 1000 ML (FLUSH)
INTRAMUSCULAR | Status: DC | PRN
  Administered 2022-12-15: 1 via INTRAMUSCULAR

## 2022-12-15 MED ORDER — CHLORHEXIDINE GLUCONATE CLOTH 2 % EX PADS: 6.0000 | MEDICATED_PAD | Freq: Once | CUTANEOUS | Status: DC

## 2022-12-15 MED ORDER — CEFAZOLIN SODIUM-DEXTROSE 2-4 GM/100ML-% IV SOLN
INTRAVENOUS | Status: AC
  Filled 2022-12-15: qty 100

## 2022-12-15 MED ORDER — CHLORHEXIDINE GLUCONATE 0.12 % MT SOLN
OROMUCOSAL | Status: AC
  Administered 2022-12-15: 15 mL via OROMUCOSAL
  Filled 2022-12-15: qty 15

## 2022-12-15 MED ORDER — PROPOFOL 10 MG/ML IV BOLUS
INTRAVENOUS | Status: AC
  Filled 2022-12-15: qty 20

## 2022-12-15 MED ORDER — MIDAZOLAM HCL 2 MG/2ML IJ SOLN
INTRAMUSCULAR | Status: DC | PRN
  Administered 2022-12-15: 2 mg via INTRAVENOUS

## 2022-12-15 MED ORDER — ACETAMINOPHEN 500 MG PO TABS: 1000.0000 mg | ORAL_TABLET | ORAL | Status: AC

## 2022-12-15 MED ORDER — PROPOFOL 10 MG/ML IV BOLUS
INTRAVENOUS | Status: DC | PRN
  Administered 2022-12-15 (×2): 20 mg via INTRAVENOUS
  Administered 2022-12-15: 50 mg via INTRAVENOUS
  Administered 2022-12-15: 150 mg via INTRAVENOUS

## 2022-12-15 MED ORDER — DEXMEDETOMIDINE HCL IN NACL 80 MCG/20ML IV SOLN
INTRAVENOUS | Status: DC | PRN
  Administered 2022-12-15 (×3): 4 ug via BUCCAL
  Administered 2022-12-15: 8 ug via BUCCAL

## 2022-12-15 MED ORDER — SODIUM CHLORIDE 0.9 % IV SOLN: INTRAVENOUS | Status: DC

## 2022-12-15 MED ORDER — BUPIVACAINE LIPOSOME 1.3 % IJ SUSP: 20.0000 mL | Freq: Once | INTRAMUSCULAR | Status: DC

## 2022-12-15 MED ORDER — OXYCODONE HCL 5 MG PO TABS: 5.0000 mg | ORAL_TABLET | ORAL | 0 refills | Status: DC | PRN

## 2022-12-15 MED ORDER — SUGAMMADEX SODIUM 200 MG/2ML IV SOLN
INTRAVENOUS | Status: DC | PRN
  Administered 2022-12-15: 200 mg via INTRAVENOUS

## 2022-12-15 MED ORDER — OXYCODONE HCL 5 MG PO TABS
ORAL_TABLET | ORAL | Status: AC
  Filled 2022-12-15: qty 1

## 2022-12-15 MED ORDER — ORAL CARE MOUTH RINSE: 15.0000 mL | Freq: Once | OROMUCOSAL | Status: AC

## 2022-12-15 MED ORDER — CHLORHEXIDINE GLUCONATE CLOTH 2 % EX PADS
6.0000 | MEDICATED_PAD | Freq: Once | CUTANEOUS | Status: AC
  Administered 2022-12-15: 6 via TOPICAL

## 2022-12-15 MED ORDER — GABAPENTIN 300 MG PO CAPS: 300.0000 mg | ORAL_CAPSULE | ORAL | Status: AC

## 2022-12-15 MED ORDER — MIDAZOLAM HCL 2 MG/2ML IJ SOLN
INTRAMUSCULAR | Status: AC
  Filled 2022-12-15: qty 2

## 2022-12-15 SURGICAL SUPPLY — 55 items
ADAPTER CATH DIALYSIS 4X8 IT L (MISCELLANEOUS) ×1 IMPLANT
ADH SKN CLS APL DERMABOND .7 (GAUZE/BANDAGES/DRESSINGS) ×1
APL PRP STRL LF DISP 70% ISPRP (MISCELLANEOUS) ×1
BIOPATCH WHT 1IN DISK W/4.0 H (GAUZE/BANDAGES/DRESSINGS) ×1 IMPLANT
BLADE SURG 11 STRL SS SAFETY (MISCELLANEOUS) ×1 IMPLANT
CATH EXTENDED DIALYSIS (CATHETERS) ×1 IMPLANT
CHLORAPREP W/TINT 26 (MISCELLANEOUS) ×1 IMPLANT
DERMABOND ADVANCED .7 DNX12 (GAUZE/BANDAGES/DRESSINGS) ×1 IMPLANT
ELECT CAUTERY BLADE 6.4 (BLADE) ×1 IMPLANT
ELECT CAUTERY BLADE TIP 2.5 (TIP) ×1
ELECT REM PT RETURN 9FT ADLT (ELECTROSURGICAL) ×1
ELECTRODE CAUTERY BLDE TIP 2.5 (TIP) ×1 IMPLANT
ELECTRODE REM PT RTRN 9FT ADLT (ELECTROSURGICAL) ×1 IMPLANT
GLOVE SURG SYN 7.0 (GLOVE) ×3 IMPLANT
GLOVE SURG SYN 7.0 PF PI (GLOVE) ×1 IMPLANT
GLOVE SURG SYN 7.5  E (GLOVE) ×3
GLOVE SURG SYN 7.5 E (GLOVE) ×3 IMPLANT
GLOVE SURG SYN 7.5 PF PI (GLOVE) ×1 IMPLANT
GOWN STRL REUS W/ TWL LRG LVL3 (GOWN DISPOSABLE) ×2 IMPLANT
GOWN STRL REUS W/TWL LRG LVL3 (GOWN DISPOSABLE) ×3
GRASPER SUT TROCAR 14GX15 (MISCELLANEOUS) IMPLANT
IV NS 1000ML (IV SOLUTION) ×1
IV NS 1000ML BAXH (IV SOLUTION) ×1 IMPLANT
KIT TURNOVER KIT A (KITS) ×1 IMPLANT
LABEL OR SOLS (LABEL) ×1 IMPLANT
MANIFOLD NEPTUNE II (INSTRUMENTS) ×1 IMPLANT
MINICAP W/POVIDONE IODINE SOL (MISCELLANEOUS) ×1 IMPLANT
NDL INSUFFLATION 14GA 120MM (NEEDLE) ×1 IMPLANT
NEEDLE HYPO 22GX1.5 SAFETY (NEEDLE) ×1 IMPLANT
NEEDLE INSUFFLATION 14GA 120MM (NEEDLE) ×1 IMPLANT
NS IRRIG 500ML POUR BTL (IV SOLUTION) ×1 IMPLANT
PACK LAP CHOLECYSTECTOMY (MISCELLANEOUS) ×1 IMPLANT
PENCIL SMOKE EVACUATOR COATED (MISCELLANEOUS) ×1 IMPLANT
SET CYSTO W/LG BORE CLAMP LF (SET/KITS/TRAYS/PACK) ×1 IMPLANT
SET TRANSFER 6 W/TWIST CLAMP 5 (SET/KITS/TRAYS/PACK) ×1 IMPLANT
SET TUBE SMOKE EVAC HIGH FLOW (TUBING) ×1 IMPLANT
SLEEVE ADV FIXATION 5X100MM (TROCAR) ×2 IMPLANT
SPONGE DRAIN TRACH 4X4 STRL 2S (GAUZE/BANDAGES/DRESSINGS) ×2 IMPLANT
STYLET FALLER (MISCELLANEOUS) IMPLANT
STYLET FALLER MEDIONICS (MISCELLANEOUS) IMPLANT
SUT ETHILON 2 0 FS 18 (SUTURE) ×1 IMPLANT
SUT MNCRL 4-0 (SUTURE) ×1
SUT MNCRL 4-0 27XMFL (SUTURE) ×1
SUT PROLENE 2 0 FS (SUTURE) ×1 IMPLANT
SUT VIC AB 3-0 SH 27 (SUTURE) ×1
SUT VIC AB 3-0 SH 27X BRD (SUTURE) ×1 IMPLANT
SUT VICRYL 0 UR6 27IN ABS (SUTURE) ×1 IMPLANT
SUTURE MNCRL 4-0 27XMF (SUTURE) ×1 IMPLANT
SYS KII FIOS ACCESS ABD 5X100 (TROCAR) ×1
SYSTEM KII FIOS ACES ABD 5X100 (TROCAR) ×1 IMPLANT
TAPE CLOTH SURG 4X10 WHT LF (GAUZE/BANDAGES/DRESSINGS) ×1 IMPLANT
TRAP FLUID SMOKE EVACUATOR (MISCELLANEOUS) ×1 IMPLANT
TROCAR BALLN GELPORT 12X130M (ENDOMECHANICALS) ×1 IMPLANT
TROCAR KII BLADELESS 5X150 (TROCAR) ×1 IMPLANT
WATER STERILE IRR 500ML POUR (IV SOLUTION) ×1 IMPLANT

## 2022-12-15 NOTE — Anesthesia Procedure Notes (Cosign Needed Addendum)
Procedure Name: Intubation Date/Time: 12/15/2022 9:58 AM  Performed by: Garnette Gunner, RNPre-anesthesia Checklist: Patient identified, Emergency Drugs available, Suction available and Patient being monitored Patient Re-evaluated:Patient Re-evaluated prior to induction Oxygen Delivery Method: Circle system utilized Preoxygenation: Pre-oxygenation with 100% oxygen Induction Type: IV induction Ventilation: Mask ventilation without difficulty Laryngoscope Size: McGraph and 3 Grade View: Grade I Tube type: Oral Tube size: 7.0 mm Number of attempts: 2 Airway Equipment and Method: Stylet and Oral airway Placement Confirmation: ETT inserted through vocal cords under direct vision, positive ETCO2 and breath sounds checked- equal and bilateral Secured at: 22 cm Tube secured with: Tape Dental Injury: Teeth and Oropharynx as per pre-operative assessment  Comments: Intubated by Garnette Gunner SRNA under MDA and CRNA supervision. Initial intubation attempt was esophageal, ETT removed and patient ventilated. McGrath used and ETT was placed. Placement confirmed by bilateral breath sounds, fogging, ETCO2 waveform.  Please note Ogt placed after intubation with lubricant ky jelly by SRNA easily to decompress stomach

## 2022-12-15 NOTE — Transfer of Care (Cosign Needed)
Immediate Anesthesia Transfer of Care Note  Patient: Emma Thomas  Procedure(s) Performed: LAPAROSCOPIC INSERTION CONTINUOUS AMBULATORY PERITONEAL DIALYSIS  (CAPD) CATHETER LAPAROSCOPIC REMOVAL CONTINUOUS AMBULATORY PERITONEAL DIALYSIS  (CAPD) CATHETER, removal of old catheter  Patient Location: PACU  Anesthesia Type:General  Level of Consciousness: drowsy  Airway & Oxygen Therapy: Patient Spontanous Breathing and Patient connected to face mask oxygen  Post-op Assessment: Report given to RN and Post -op Vital signs reviewed and stable  Post vital signs: Reviewed and stable  Last Vitals:  Vitals Value Taken Time  BP 116/72 12/15/22 1235  Temp    Pulse 81 12/15/22 1241  Resp 24 12/15/22 1241  SpO2 100 % 12/15/22 1241  Vitals shown include unvalidated device data.  Last Pain:  Vitals:   12/15/22 0918  TempSrc: Temporal  PainSc: 0-No pain         Complications: No notable events documented.

## 2022-12-15 NOTE — Interval H&P Note (Signed)
History and Physical Interval Note:  12/15/2022 9:26 AM  Emma Thomas  has presented today for surgery, with the diagnosis of malfunctioning PD catheter.  The various methods of treatment have been discussed with the patient and family. After consideration of risks, benefits and other options for treatment, the patient has consented to  Procedure(s): LAPAROSCOPIC INSERTION CONTINUOUS AMBULATORY PERITONEAL DIALYSIS  (CAPD) CATHETER (N/A) LAPAROSCOPIC REMOVAL CONTINUOUS AMBULATORY PERITONEAL DIALYSIS  (CAPD) CATHETER, removal of old catheter (N/A) as a surgical intervention.  The patient's history has been reviewed, patient examined, no change in status, stable for surgery.  I have reviewed the patient's chart and labs.  Questions were answered to the patient's satisfaction.     Xzaviar Maloof

## 2022-12-15 NOTE — Anesthesia Postprocedure Evaluation (Signed)
Anesthesia Post Note  Patient: Emma Thomas  Procedure(s) Performed: LAPAROSCOPIC INSERTION CONTINUOUS AMBULATORY PERITONEAL DIALYSIS  (CAPD) CATHETER LAPAROSCOPIC REMOVAL CONTINUOUS AMBULATORY PERITONEAL DIALYSIS  (CAPD) CATHETER, removal of old catheter  Patient location during evaluation: PACU Anesthesia Type: General Level of consciousness: awake and alert Pain management: pain level controlled Vital Signs Assessment: post-procedure vital signs reviewed and stable Respiratory status: spontaneous breathing, nonlabored ventilation, respiratory function stable and patient connected to nasal cannula oxygen Cardiovascular status: blood pressure returned to baseline and stable Postop Assessment: no apparent nausea or vomiting Anesthetic complications: no   No notable events documented.   Last Vitals:  Vitals:   12/15/22 1359 12/15/22 1400  BP:  115/72  Pulse: 80 80  Resp: 14 15  Temp:    SpO2: 94% 95%    Last Pain:  Vitals:   12/15/22 1359  TempSrc:   PainSc: 6                  Precious Haws Yenifer Saccente

## 2022-12-15 NOTE — Op Note (Signed)
Procedure Date:  12/15/2022  Pre-operative Diagnosis:  Malfunctioning PD catheter  Post-operative Diagnosis:  Malfunctioning PD catheter  Procedures:   Laparoscopic removal of malfunctioning right sided peritoneal dialysis catheter Laparoscopic assisted insertion of new left sided peritoneal dialysis catheter. Laparoscopic assisted colopexy.  Surgeon:  Melvyn Neth, MD  Anesthesia:  General endotracheal  Estimated Blood Loss:  15 ml  Specimens:  None  Complications:  None  Findings:  The prior PD catheter did not have any fibrin clot, and appeared to be more of a positional malfunction.  It was decided to remove the catheter.  A new left sided PD catheter was placed with better position.  It was also noted that her sigmoid colon was redundant and flopping over the catheter, so the colon was pexied to the left abdominal wall by means of epiploic appendages.  Indications for Procedure:  This is a 27 y.o. female with prior PD catheter placement, now with two revisions and with persistent malfunctioning.  It was decided to proceed with evaluation of the catheter and likely placement of a new one.  The risks of bleeding, abscess or infection, potential for an open procedure, catheter failure, and injury to surrounding structures were all discussed with the patient and she was willing to proceed.  Description of Procedure: The patient was correctly identified in the preoperative area and brought into the operating room.  The patient was placed supine with VTE prophylaxis in place.  Appropriate time-outs were performed.  Anesthesia was induced and the patient was intubated.  Appropriate antibiotics were infused.  The abdomen was prepped and draped in a sterile fashion incorporating the patient's prior PD catheter.  A new peritoneal dialysis catheter with extension tubing were brought into the field and measurements were made for appropriate placement and location of the catheter and  components in the patient's left abdominal wall.  An incision in the left upper quadrant was made and a Veress needle was introduced and pneumoperitoneum was established with appropriate opening pressures.  An additional incision was made in the right upper quadrant and Optiview technique was used to introduce a 5 mm laparoscopic port under visualization.  Then, an additional 5 mm port was placed in the right lower quadrant.  There was no bowel injury doing this.  The abdomen was inspected and the prior PD catheter did not have any clot within the tubing, but it was very deep in the pelvis.  A 2.5 cm incision was made at the planned entry location overlying the left rectus muscle, and cautery was used to dissect down the subcutaneous tissue to reach the anterior rectus sheath.  The anterior sheath was incised.  Then, a long 5 mm port was introduced through the rectus sheath incision and pushed to the posterior sheath and tunneled inferiorly for about 5-7 cm and then pushed through the peritoneum.  The new dialysis catheter was passed through the port and a laparoscopic grasper was used to place the coiled portion of the catheter in the pelvis.  The catheter was placed so that the first cuff was at the edge of the anterior rectus sheath.  At this point, pneumoperitoneum was released.  The rectus sheath was closed around the catheter cuff, and the catheter was then sized with the extension tubing for appropriate tunneling and exit in the left abdomen.  The catheter and extension tubing were connected using a titanium connector, and then the catheter was tunneled to the left upper quadrant via an additional incision and then tunneled  inferiorly to it's planned exit site, about 4 cm distal to the last cuff.  There were no complications doing this, and the catheter was maintained straight without kinks.  We then proceeded the remove the prior malfunctioning catheter.  An incision was made over the right rectus at the  insertion site and another incision at the tunneling counterincision from before.  The catheter was removed without complications.  The fascia over the rectus muscle was closed using 0 Vicryl.  Upon going back into the abdomen, it was noted that the sigmoid colon was flopping over the catheter.  This could have been a source of the prior malfunction.  As a precaution, the sigmoid colon was pexied to the left abdominal wall using two 2-0 Prolene sutures, holding two epiploic appendages to the abdominal wall, effectively moving the sigmoid colon out of the way.  Then the connectors were placed and the new catheter was tested for flow/drainage using heparinized saline.  It had great flow both in and out.  With the catheter in place, 40 ml of local anesthetic was infiltrated onto the rectus sheaths, and all incisions.  The incisions were closed in layers using 3-0 Vicryl and 4-0 Monocryl.  The wounds were cleaned and sealed with DermaBond.  A Biopatch was placed at the catheter exit site and the catheter was dressed with 4x4 gauze and porous tape.  The patient was emerged from anesthesia and extubated and brought to the recovery room for further management.  The patient tolerated the procedure well and all counts were correct at the end of the case.   Melvyn Neth, MD

## 2022-12-15 NOTE — OR Nursing (Signed)
PD catheter that was placed on 06/09/2022 was removed and a new PD catheter was placed on 12/15/2022.

## 2022-12-15 NOTE — Anesthesia Preprocedure Evaluation (Signed)
Anesthesia Evaluation  Patient identified by MRN, date of birth, ID band Patient awake    Reviewed: Allergy & Precautions, NPO status , Patient's Chart, lab work & pertinent test results  History of Anesthesia Complications Negative for: history of anesthetic complications  Airway Mallampati: III  TM Distance: >3 FB Neck ROM: full    Dental  (+) Chipped   Pulmonary neg pulmonary ROS, neg shortness of breath   Pulmonary exam normal        Cardiovascular Exercise Tolerance: Good hypertension, (-) angina (-) DOE Normal cardiovascular exam     Neuro/Psych  Headaches PSYCHIATRIC DISORDERS         GI/Hepatic negative GI ROS, Neg liver ROS,,,  Endo/Other  diabetes, Type 2, Insulin Dependent    Renal/GU DialysisRenal disease     Musculoskeletal   Abdominal   Peds  Hematology negative hematology ROS (+)   Anesthesia Other Findings Past Medical History: No date: Anemia No date: Diabetes mellitus without complication (HCC)     Comment:  Type 1 DM No date: Essential hypertension No date: Headache 03/04/2013: Hypertension No date: Neurologic disorder     Comment:  Both feet No date: Recurrent UTI No date: Renal disorder  Past Surgical History: No date: abscess removal     Comment:  excision of bartholin cyst 11/23/2022: AMPUTATION; Left     Comment:  Procedure: LEFT GREAT TOE PARTIAL RAY AMPUTATION;                Surgeon: Sharlotte Alamo, DPM;  Location: [facility] ORS;  Service:              Podiatry;  Laterality: Left; 06/09/2022: CAPD INSERTION; N/A     Comment:  Procedure: LAPAROSCOPIC INSERTION CONTINUOUS AMBULATORY               PERITONEAL DIALYSIS  (CAPD) CATHETER, PD rep to be               present;  Surgeon: Olean Ree, MD;  Location: [facility]               ORS;  Service: General;  Laterality: N/A; 07/16/2022: CAPD REVISION; N/A     Comment:  Procedure: [place]  (CAPD) CATHETER;  Surgeon: Olean Ree, MD;  Location: [facility] ORS;  Service: General;                Laterality: N/A; 11/24/2022: CAPD REVISION; N/A     Comment:  Procedure: LAPAROSCOPIC REVISION CONTINUOUS AMBULATORY               PERITONEAL DIALYSIS  (CAPD) CATHETER;  Surgeon: Olean Ree, MD;  Location: [facility] ORS;  Service: General;                Laterality: N/A; 04/24/2022: DIALYSIS/PERMA CATHETER INSERTION; N/A     Comment:  Procedure: DIALYSIS/PERMA CATHETER INSERTION;  Surgeon:               Katha Cabal, MD;  Location: [facility];               Service: Cardiovascular;  Laterality: N/A; 06/02/2022: DIALYSIS/PERMA CATHETER REMOVAL; N/A     Comment:  Procedure: DIALYSIS/PERMA CATHETER REMOVAL;  Surgeon:  Schnier, Dolores Lory, MD;  Location: [facility];               Service: Cardiovascular;  Laterality: N/A; No date: INSERTION OF DIALYSIS CATHETER; Right     Comment:  ARM 11/27/2022: IR FLUORO GUIDE CV LINE RIGHT 11/27/2022: IR US GUIDE VASC ACCESS RIGHT 01/2011: Nexplanon 02/06/4009: UMBILICAL HERNIA REPAIR; N/A     Comment:  Procedure: HERNIA REPAIR UMBILICAL ADULT;  Surgeon:               Olean Ree, MD;  Location: [facility] ORS;  Service:               General;  Laterality: N/A;  BMI    Body Mass Index: 24.21 kg/m      Reproductive/Obstetrics negative OB ROS                             Anesthesia Physical Anesthesia Plan  ASA: 4  Anesthesia Plan: General ETT   Post-op Pain Management:    Induction: Intravenous  PONV Risk Score and Plan: Ondansetron, Dexamethasone, Midazolam and Treatment may vary due to age or medical condition  Airway Management Planned: Oral ETT  Additional Equipment:   Intra-op Plan:   Post-operative Plan: Extubation in OR  Informed Consent: I have reviewed the patients History and Physical, chart, labs and discussed the  procedure including the risks, benefits and alternatives for the proposed anesthesia with the patient or authorized representative who has indicated his/her understanding and acceptance.     Dental Advisory Given  Plan Discussed with: Anesthesiologist, CRNA and Surgeon  Anesthesia Plan Comments: (Patient consented for risks of anesthesia including but not limited to:  - adverse reactions to medications - damage to eyes, teeth, lips or other oral mucosa - nerve damage due to positioning  - sore throat or hoarseness - Damage to heart, brain, nerves, lungs, other parts of body or loss of life  Patient voiced understanding.)       Anesthesia Quick Evaluation

## 2022-12-15 NOTE — Discharge Instructions (Addendum)
AMBULATORY SURGERY  DISCHARGE INSTRUCTIONS   The drugs that you were given will stay in your system until tomorrow so for the next 24 hours you should not:  Drive an automobile Make any legal decisions Drink any alcoholic beverage   You may resume regular meals tomorrow.  Today it is better to start with liquids and gradually work up to solid foods.  You may eat anything you prefer, but it is better to start with liquids, then soup and crackers, and gradually work up to solid foods.   Please notify your doctor immediately if you have any unusual bleeding, trouble breathing, redness and pain at the surgery site, drainage, fever, or pain not relieved by medication.    Additional Instructions: PLEASE LEAVE GREEN ARMBAND ON FOR 4 DAYS    Please contact your physician with any problems or Same Day Surgery at [phone], Monday through Friday 6 am to 4 pm, or [place] at [facility] number at [phone].   Information for Discharge Teaching: EXPAREL (bupivacaine liposome injectable suspension)   Your surgeon or anesthesiologist gave you EXPAREL(bupivacaine) to help control your pain after surgery.  EXPAREL is a local anesthetic that provides pain relief by numbing the tissue around the surgical site. EXPAREL is designed to release pain medication over time and can control pain for up to 72 hours. Depending on how you respond to EXPAREL, you may require less pain medication during your recovery.  Possible side effects: Temporary loss of sensation or ability to move in the area where bupivacaine was injected. Nausea, vomiting, constipation Rarely, numbness and tingling in your mouth or lips, lightheadedness, or anxiety may occur. Call your doctor right away if you think you may be experiencing any of these sensations, or if you have other questions regarding possible side effects.  Follow all other discharge instructions given to you by your surgeon or nurse. Eat a healthy  diet and drink plenty of water or other fluids.  If you return to the hospital for any reason within 96 hours following the administration of EXPAREL, it is important for health care providers to know that you have received this anesthetic. A teal colored band has been placed on your arm with the date, time and amount of EXPAREL you have received in order to alert and inform your health care providers. Please leave this armband in place for the full 96 hours following administration, and then you may remove the band.

## 2022-12-16 ENCOUNTER — Telehealth: Payer: Self-pay | Admitting: *Deleted

## 2022-12-16 ENCOUNTER — Encounter: Payer: Self-pay | Admitting: Surgery

## 2022-12-16 NOTE — Telephone Encounter (Signed)
This patient had surgery yesterday by Dr Hampton Abbot PD Cath placement and she was given oxycodone but its not helping with the pain. She is also taking the tylenol, she wants to see if we can prescribe her something stronger   [facility] uses [facility] on [address].

## 2022-12-25 ENCOUNTER — Telehealth (INDEPENDENT_AMBULATORY_CARE_PROVIDER_SITE_OTHER): Payer: Self-pay

## 2022-12-25 NOTE — Telephone Encounter (Signed)
Spoke with the patient and she is scheduled with Dr. Lucky Cowboy on 12/28/22 for a permcath removal with [facility] 2:00 pm [facility]. Pre-procedure instructions were discussed and patient stated she understood.

## 2022-12-28 ENCOUNTER — Ambulatory Visit
Admission: RE | Admit: 2022-12-28 | Discharge: 2022-12-28 | Disposition: A | Discharge status: Home / Self Care | Payer: Commercial Managed Care - PPO | Attending: Vascular Surgery | Admitting: Vascular Surgery

## 2022-12-28 ENCOUNTER — Encounter
Admission: RE | Disposition: A | Discharge status: Home / Self Care | Payer: Self-pay | Source: Home / Self Care | Attending: Vascular Surgery

## 2022-12-28 DIAGNOSIS — E1022 Type 1 diabetes mellitus with diabetic chronic kidney disease: Secondary | ICD-10-CM | POA: Diagnosis not present

## 2022-12-28 DIAGNOSIS — N186 End stage renal disease: Secondary | ICD-10-CM | POA: Diagnosis not present

## 2022-12-28 DIAGNOSIS — I120 Hypertensive chronic kidney disease with stage 5 chronic kidney disease or end stage renal disease: Secondary | ICD-10-CM

## 2022-12-28 DIAGNOSIS — Z992 Dependence on renal dialysis: Secondary | ICD-10-CM | POA: Diagnosis not present

## 2022-12-28 DIAGNOSIS — Z95828 Presence of other vascular implants and grafts: Secondary | ICD-10-CM

## 2022-12-28 DIAGNOSIS — Z452 Encounter for adjustment and management of vascular access device: Secondary | ICD-10-CM | POA: Insufficient documentation

## 2022-12-28 DIAGNOSIS — E1122 Type 2 diabetes mellitus with diabetic chronic kidney disease: Secondary | ICD-10-CM

## 2022-12-28 HISTORY — PX: DIALYSIS/PERMA CATHETER REMOVAL: CATH118289

## 2022-12-28 SURGERY — DIALYSIS/PERMA CATHETER REMOVAL
Anesthesia: LOCAL

## 2022-12-28 MED ORDER — LIDOCAINE-EPINEPHRINE (PF) 1 %-1:200000 IJ SOLN
INTRAMUSCULAR | Status: DC | PRN
  Administered 2022-12-28: 20 mL

## 2022-12-28 SURGICAL SUPPLY — 2 items
FORCEPS HALSTEAD CVD 5IN STRL (INSTRUMENTS) IMPLANT
TRAY LACERAT/PLASTIC (MISCELLANEOUS) IMPLANT

## 2022-12-28 NOTE — Op Note (Signed)
Operative Note     Preoperative diagnosis:   1. ESRD with functional permanent access  Postoperative diagnosis:  1. ESRD with functional permanent access  Procedure:  Removal of right jugular Permcath  Surgeon:  Leotis Pain, MD  Assistant:  Annalee Genta, NP  Anesthesia:  Local  EBL:  Minimal  Indication for the Procedure:  The patient has a functional permanent dialysis access and no longer needs their permcath.  This can be removed.  Risks and benefits are discussed and informed consent is obtained.  Description of the Procedure:  The patient's right neck, chest and existing catheter were sterilely prepped and draped. The area around the catheter was anesthetized copiously with 1% lidocaine. The catheter was dissected out with curved hemostats until the cuff was freed from the surrounding fibrous sheath. The fiber sheath was transected, and the catheter was then removed in its entirety using gentle traction. Pressure was held and sterile dressings were placed. The patient tolerated the procedure well and was taken to the recovery room in stable condition.     Leotis Pain  12/28/2022, 3:54 PM This note was created with Dragon Medical transcription system. Any errors in dictation are purely unintentional.

## 2022-12-28 NOTE — Discharge Instructions (Signed)
Tunneled Catheter Removal, Care After Refer to this sheet in the next few weeks. These instructions provide you with information about caring for yourself after your procedure. Your health care provider may also give you more specific instructions. Your treatment has been planned according to current medical practices, but problems sometimes occur. Call your health care provider if you have any problems or questions after your procedure. What can I expect after the procedure? After the procedure, it is common to have: Some mild redness, swelling, and pain around your catheter site.   Follow these instructions at home: Incision care  Check your removal site  every day for signs of infection. Check for: More redness, swelling, or pain. More fluid or blood. Warmth. Pus or a bad smell. Remove your dressing in 48hrs leave open to air  Activity  Return to your normal activities as told by your health care provider. Ask your health care provider what activities are safe for you. Do not lift anything that is heavier than 10 lb (4.5 kg) for 3 days  You may shower tomorrow  Contact a health care provider if: You have more fluid or blood coming from your removal site You have more redness, swelling, or pain at your incisions or around the area where your catheter was removed Your removal site feel warm to the touch. You feel unusually weak. You feel nauseous.. Get help right away if You have swelling in your arm, shoulder, neck, or face. You develop chest pain. You have difficulty breathing. You feel dizzy or light-headed. You have pus or a bad smell coming from your removal site You have a fever. You develop bleeding from your removal site, and your bleeding does not stop. This information is not intended to replace advice given to you by your health care provider. Make sure you discuss any questions you have with your health care provider. Document Released: 10/19/2012 Document Revised:  07/05/2016 Document Reviewed: 07/29/2015 Elsevier Interactive Patient Education  2017 [employer] Inc[employer] 

## 2022-12-28 NOTE — H&P (Signed)
[facility] Admission History & Physical  MRN : [id number]  Emma Thomas is a 27 y.o. (June 11, 1996) female who presents with chief complaint of No chief complaint on file. Marland Kitchen  History of Present Illness: I am asked to evaluate the patient by the dialysis center. The patient was sent here because they have a nonfunctioning tunneled catheter and a functioning PD catheter.  The patient reports they're not been any problems with any of their dialysis runs. They are reporting good flows with good parameters at dialysis.   Patient denies pain or tenderness overlying the access.  There is no pain with dialysis.  The patient denies hand pain or finger pain consistent with steal syndrome.  No fevers or chills while on dialysis.    No current facility-administered medications for this encounter.    Past Medical History:  Diagnosis Date   Anemia    Diabetes mellitus without complication ([place])    Type 1 DM   Essential hypertension    Headache    Hypertension 03/04/2013   Neurologic disorder    Both feet   Recurrent UTI    Renal disorder     Past Surgical History:  Procedure Laterality Date   abscess removal     excision of bartholin cyst   AMPUTATION Left 11/23/2022   Procedure: LEFT GREAT TOE PARTIAL RAY AMPUTATION;  Surgeon: Sharlotte Alamo, DPM;  Location: [facility];  Service: Podiatry;  Laterality: Left;   CAPD INSERTION N/A 06/09/2022   Procedure: LAPAROSCOPIC INSERTION CONTINUOUS AMBULATORY PERITONEAL DIALYSIS  (CAPD) CATHETER, PD rep to be present;  Surgeon: Olean Ree, MD;  Location: [facility];  Service: General;  Laterality: N/A;   CAPD INSERTION N/A 12/15/2022   Procedure: LAPAROSCOPIC INSERTION CONTINUOUS AMBULATORY PERITONEAL DIALYSIS  (CAPD) CATHETER;  Surgeon: Olean Ree, MD;  Location: [facility];  Service: General;  Laterality: N/A;   CAPD REMOVAL N/A 12/15/2022   Procedure: LAPAROSCOPIC REMOVAL CONTINUOUS AMBULATORY PERITONEAL DIALYSIS  (CAPD)  CATHETER, removal of old catheter;  Surgeon: Olean Ree, MD;  Location: [facility];  Service: General;  Laterality: N/A;   CAPD REVISION N/A 07/16/2022   Procedure: LAPAROSCOPIC REVISION CONTINUOUS AMBULATORY PERITONEAL DIALYSIS  (CAPD) CATHETER;  Surgeon: Olean Ree, MD;  Location: [facility];  Service: General;  Laterality: N/A;   CAPD REVISION N/A 11/24/2022   Procedure: LAPAROSCOPIC REVISION CONTINUOUS AMBULATORY PERITONEAL DIALYSIS  (CAPD) CATHETER;  Surgeon: Olean Ree, MD;  Location: [facility];  Service: General;  Laterality: N/A;   DIALYSIS/PERMA CATHETER INSERTION N/A 04/24/2022   Procedure: DIALYSIS/PERMA CATHETER INSERTION;  Surgeon: Katha Cabal, MD;  Location: [facility];  Service: Cardiovascular;  Laterality: N/A;   DIALYSIS/PERMA CATHETER REMOVAL N/A 06/02/2022   Procedure: DIALYSIS/PERMA CATHETER REMOVAL;  Surgeon: Katha Cabal, MD;  Location: [facility];  Service: Cardiovascular;  Laterality: N/A;   INSERTION OF DIALYSIS CATHETER Right    ARM   IR FLUORO GUIDE CV LINE RIGHT  11/27/2022   IR US GUIDE VASC ACCESS RIGHT  11/27/2022   Nexplanon  [id number]   UMBILICAL HERNIA REPAIR N/A 06/09/2022   Procedure: HERNIA REPAIR UMBILICAL ADULT;  Surgeon: Olean Ree, MD;  Location: [facility];  Service: General;  Laterality: N/A;     Social History   Tobacco Use   Smoking status: Never    Passive exposure: Past   Smokeless tobacco: Never  Vaping Use   Vaping Use: Never used  Substance Use Topics   Alcohol use: Not Currently  Alcohol/week: 2.0 standard drinks of alcohol    Types: 2 Shots of liquor per week   Drug use: No     Family History  Problem Relation Age of Onset   Breast cancer Mother 22   Lung cancer Maternal Grandmother    Diabetes type II Paternal Grandmother     No family history of bleeding or clotting disorders, autoimmune disease or porphyria  No Known Allergies   REVIEW OF SYSTEMS (Negative unless  checked)  Constitutional: []$ Weight loss  []$ Fever  []$ Chills Cardiac: []$ Chest pain   []$ Chest pressure   []$ Palpitations   []$ Shortness of breath when laying flat   []$ Shortness of breath at rest   [x]$ Shortness of breath with exertion. Vascular:  []$ Pain in legs with walking   []$ Pain in legs at rest   []$ Pain in legs when laying flat   []$ Claudication   []$ Pain in feet when walking  []$ Pain in feet at rest  []$ Pain in feet when laying flat   []$ History of DVT   []$ Phlebitis   []$ Swelling in legs   []$ Varicose veins   []$ Non-healing ulcers Pulmonary:   []$ Uses home oxygen   []$ Productive cough   []$ Hemoptysis   []$ Wheeze  []$ COPD   []$ Asthma Neurologic:  []$ Dizziness  []$ Blackouts   []$ Seizures   []$ History of stroke   []$ History of TIA  []$ Aphasia   []$ Temporary blindness   []$ Dysphagia   []$ Weakness or numbness in arms   []$ Weakness or numbness in legs Musculoskeletal:  []$ Arthritis   []$ Joint swelling   []$ Joint pain   []$ Low back pain Hematologic:  []$ Easy bruising  []$ Easy bleeding   []$ Hypercoagulable state   []$ Anemic  []$ Hepatitis Gastrointestinal:  []$ Blood in stool   []$ Vomiting blood  []$ Gastroesophageal reflux/heartburn   []$ Difficulty swallowing. Genitourinary:  [x]$ Chronic kidney disease   []$ Difficult urination  []$ Frequent urination  []$ Burning with urination   []$ Blood in urine Skin:  []$ Rashes   []$ Ulcers   []$ Wounds Psychological:  []$ History of anxiety   []$  History of major depression.  Physical Examination  Vitals:   12/28/22 1451  BP: (!) 149/97  Pulse: 100  Resp: 15  Temp: 98.7 F (37.1 C)  TempSrc: Oral  SpO2: 100%  Weight: 68 kg  Height: 5' 6"$  (1.676 m)   Body mass index is 24.21 kg/m. Gen: WD/WN, NAD Head: [place]/AT, No temporalis wasting.  Ear/Nose/Throat: Hearing grossly intact, nares w/o erythema or drainage, oropharynx w/o Erythema/Exudate,  Eyes: Conjunctiva clear, sclera non-icteric Neck: Trachea midline.  No JVD.  Pulmonary:  Good air movement, respirations not labored, no use of accessory muscles.   Cardiac: RRR, normal S1, S2. Vascular:  Vessel Right Left  Radial Palpable Palpable   Musculoskeletal: M/S 5/5 throughout.  Extremities without ischemic changes.  No deformity or atrophy.  Neurologic: Sensation grossly intact in extremities.  Symmetrical.  Speech is fluent. Motor exam as listed above. Psychiatric: Judgment intact, Mood & affect appropriate for pt's clinical situation. Dermatologic: No rashes or ulcers noted.  No cellulitis or open wounds.    CBC Lab Results  Component Value Date   WBC 7.2 11/28/2022   HGB 9.9 (L) 12/15/2022   HCT 29.0 (L) 12/15/2022   MCV 88.6 11/28/2022   PLT 400 11/28/2022    BMET    Component Value Date/Time   NA 135 12/15/2022 0917   NA 136 10/25/2019 1022   K 3.3 (L) 12/15/2022 0917   CL 96 (L) 12/15/2022 0917   CO2 20 (L) 11/28/2022 0919   GLUCOSE 164 (H) 12/15/2022 0917   BUN 10  12/15/2022 0917   BUN 21 (H) 10/25/2019 1022   CREATININE 4.00 (H) 12/15/2022 0917   CALCIUM 8.4 (L) 11/28/2022 0919   GFRNONAA 10 (L) 11/28/2022 0919   GFRAA 55 (L) 02/13/2020 1439   Estimated Creatinine Clearance: 19.6 mL/min (A) (by C-G formula based on SCr of 4 mg/dL (H)).  COAG Lab Results  Component Value Date   INR 1.16 08/16/2017    Radiology No results found.  Assessment/Plan 1.  Complication dialysis device:  Patient's Tunneled catheter is not being used. The patient has a PD catheter that is functioning well. Therefore, the patient will undergo removal of the tunneled catheter under local anesthesia.  The risks and benefits were described to the patient.  All questions were answered.  The patient agrees to proceed with angiography and intervention. Potassium will be drawn to ensure that it is an appropriate level prior to performing intervention. 2.  End-stage renal disease requiring hemodialysis:  Patient will continue dialysis therapy without further interruption with her PD catheter 3.  Hypertension:  Patient will continue medical  management; nephrology is following no changes in oral medications. 4. Diabetes mellitus:  Glucose will be monitored and oral medications been held this morning once the patient has undergone the patient's procedure po intake will be reinitiated and again Accu-Cheks will be used to assess the blood glucose level and treat as needed. The patient will be restarted on the patient's usual hypoglycemic regime     Leotis Pain, MD  12/28/2022 3:51 PM

## 2022-12-29 ENCOUNTER — Telehealth (HOSPITAL_BASED_OUTPATIENT_CLINIC_OR_DEPARTMENT_OTHER): Payer: Commercial Managed Care - PPO | Admitting: Infectious Diseases

## 2022-12-29 ENCOUNTER — Encounter: Payer: Self-pay | Admitting: Vascular Surgery

## 2022-12-29 DIAGNOSIS — Z9641 Presence of insulin pump (external) (internal): Secondary | ICD-10-CM

## 2022-12-29 DIAGNOSIS — E11628 Type 2 diabetes mellitus with other skin complications: Secondary | ICD-10-CM

## 2022-12-29 DIAGNOSIS — E1022 Type 1 diabetes mellitus with diabetic chronic kidney disease: Secondary | ICD-10-CM

## 2022-12-29 DIAGNOSIS — L089 Local infection of the skin and subcutaneous tissue, unspecified: Secondary | ICD-10-CM | POA: Diagnosis not present

## 2022-12-29 DIAGNOSIS — Z992 Dependence on renal dialysis: Secondary | ICD-10-CM

## 2022-12-29 DIAGNOSIS — E10628 Type 1 diabetes mellitus with other skin complications: Secondary | ICD-10-CM

## 2022-12-29 NOTE — Progress Notes (Signed)
NAME: Emma Thomas  DOB: [birth year]  MRN: [id number]  Date/Time: 12/29/2022 9:30 AM   ?The purpose of this virtual visit is to provide medical care while limiting exposure to the novel coronavirus (COVID19) for both patient and office staff.   Consent was obtained for video  visit:  Yes.   Answered questions that patient had about telehealth interaction:  Yes.   I discussed the limitations, risks, security and privacy concerns of performing an evaluation and management service by telephone. I also discussed with the patient that there may be a patient responsible charge related to this service. The patient expressed understanding and agreed to proceed.   Patient Location: car in the parking lot Provider Location:Remote, [place] Patient, provider and CMA on the visit  Follow up visit for left foot infection Emma Thomas is a 27 y.o. 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 She  has completed 4 weeks on 12/21/22 and . The RTIJ HD cath was removed yesterday as the AVG is working. The foot is doing well- she followed up with podiatrist Dr.Cline and sutures were removed  she is using special boot now She has no fever or chills Minimal swelling left foot She  says whenever she tried to do PD it is not working and she is going to ask that the catheter be removed- she had missed a day of HD as she tried to make the PD work- She plans to go for HD tomorrow She says her blood glucose was 116 today Bp was 135/85 Se is adherent to all her meds  Past  Medical History:  Diagnosis Date   Anemia    Diabetes mellitus without complication ([place])    Type 1 DM   Essential hypertension    Headache    Hypertension 03/04/2013   Neurologic disorder    Both feet   Recurrent UTI    Renal disorder     Past Surgical History:  Procedure Laterality Date   abscess removal     excision of bartholin cyst   AMPUTATION Left 11/23/2022   Procedure: LEFT GREAT TOE PARTIAL RAY AMPUTATION;  Surgeon: Sharlotte Alamo, DPM;  Location: [facility];  Service: Podiatry;  Laterality: Left;   CAPD INSERTION N/A 06/09/2022   Procedure: LAPAROSCOPIC INSERTION CONTINUOUS AMBULATORY PERITONEAL DIALYSIS  (CAPD) CATHETER, PD rep to be present;  Surgeon: Olean Ree, MD;  Location: [facility];  Service: General;  Laterality: N/A;   CAPD INSERTION N/A 12/15/2022   Procedure: LAPAROSCOPIC INSERTION CONTINUOUS AMBULATORY PERITONEAL DIALYSIS  (CAPD) CATHETER;  Surgeon: Olean Ree, MD;  Location: [facility];  Service: General;  Laterality: N/A;   CAPD REMOVAL N/A 12/15/2022   Procedure: LAPAROSCOPIC REMOVAL CONTINUOUS AMBULATORY PERITONEAL DIALYSIS  (CAPD) CATHETER, removal of old catheter;  Surgeon: Olean Ree, MD;  Location: [facility];  Service: General;  Laterality: N/A;   CAPD REVISION N/A 07/16/2022   Procedure: LAPAROSCOPIC REVISION CONTINUOUS AMBULATORY PERITONEAL DIALYSIS  (CAPD) CATHETER;  Surgeon: Olean Ree, MD;  Location: [facility];  Service: General;  Laterality: N/A;   CAPD  REVISION N/A 11/24/2022   Procedure: LAPAROSCOPIC REVISION CONTINUOUS AMBULATORY PERITONEAL DIALYSIS  (CAPD) CATHETER;  Surgeon: Olean Ree, MD;  Location: [facility];  Service: General;  Laterality: N/A;   DIALYSIS/PERMA CATHETER INSERTION N/A 04/24/2022   Procedure: DIALYSIS/PERMA CATHETER INSERTION;  Surgeon: Katha Cabal, MD;  Location: [facility];  Service: Cardiovascular;  Laterality: N/A;   DIALYSIS/PERMA CATHETER REMOVAL N/A 06/02/2022   Procedure: DIALYSIS/PERMA CATHETER  REMOVAL;  Surgeon: Katha Cabal, MD;  Location: [facility];  Service: Cardiovascular;  Laterality: N/A;   DIALYSIS/PERMA CATHETER REMOVAL N/A 12/28/2022   Procedure: DIALYSIS/PERMA CATHETER REMOVAL;  Surgeon: Algernon Huxley, MD;  Location: [facility];  Service: Cardiovascular;  Laterality: N/A;   INSERTION OF DIALYSIS CATHETER Right    ARM   IR FLUORO GUIDE CV LINE RIGHT  11/27/2022   IR US GUIDE VASC ACCESS RIGHT  11/27/2022   Nexplanon  [id number]   UMBILICAL HERNIA REPAIR N/A 06/09/2022   Procedure: HERNIA REPAIR UMBILICAL ADULT;  Surgeon: Olean Ree, MD;  Location: [facility];  Service: General;  Laterality: N/A;    Social History   Socioeconomic History   Marital status: Married    Spouse name: Cameo   Number of children: 1   Years of education: Not on file   Highest education level: Not on file  Occupational History   Not on file  Tobacco Use   Smoking status: Never    Passive exposure: Past   Smokeless tobacco: Never  Vaping Use   Vaping Use: Never used  Substance and Sexual Activity   Alcohol use: Not Currently    Alcohol/week: 2.0 standard drinks of alcohol    Types: 2 Shots of liquor per week   Drug use: No   Sexual activity: Yes    Birth control/protection: None  Other Topics Concern   Not on file  Social History Narrative   Not on file   Social Determinants of Health   Financial Resource Strain: Not on file  Food Insecurity: No Food Insecurity (11/21/2022)   Hunger Vital Sign    Worried About Running Out of Food in the Last Year: Never true    Ran Out of Food in the Last Year: Never true  Transportation Needs: No Transportation Needs (11/21/2022)   PRAPARE - Hydrologist (Medical): No    Lack of Transportation (Non-Medical): No  Physical Activity: Not on file  Stress: Not on file  Social Connections: Not on file  Intimate Partner Violence: Not At Risk (11/21/2022)   Humiliation, Afraid, Rape, and Kick  questionnaire    Fear of Current or Ex-Partner: No    Emotionally Abused: No    Physically Abused: No    Sexually Abused: No    Family History  Problem Relation Age of Onset   Breast cancer Mother 6   Lung cancer Maternal Grandmother    Diabetes type II Paternal Grandmother    No Known Allergies I? Current Outpatient Medications  Medication Sig Dispense Refill   acetaminophen (TYLENOL) 500 MG tablet Take 2 tablets (1,000 mg total) by mouth every 6 (six) hours as needed for mild pain.     amLODipine (NORVASC) 10 MG tablet Take 1 tablet (10 mg total) by mouth daily. 30 tablet 0   atorvastatin (LIPITOR) 40 MG tablet Take 80 mg by mouth daily.     bumetanide (BUMEX) 1 MG tablet Take 1 mg by mouth 2 (two) times daily.  ceFAZolin (ANCEF) 1-4 GM/50ML-% SOLN Inject 50 mLs (1 g total) into the vein daily. 1500 mL    Continuous Blood Gluc Sensor (DEXCOM G6 SENSOR) MISC by Does not apply route.     ferrous gluconate (FERGON) 324 MG tablet Take 324 mg by mouth daily with breakfast.     furosemide (LASIX) 80 MG tablet Take 80 mg by mouth daily.     HUMALOG 100 UNIT/ML injection Inject into the skin.     insulin lispro (HUMALOG) 100 UNIT/ML injection Inject 5-10 Units into the skin 3 (three) times daily before meals. Plus sliding scale 1 unit per 50 > 150     JUNEL 1/20 1-20 MG-MCG tablet Take 1 tablet by mouth daily.     losartan (COZAAR) 100 MG tablet Take 100 mg by mouth daily.     montelukast (SINGULAIR) 10 MG tablet Take 10 mg by mouth daily.     OVER THE COUNTER MEDICATION Tandem Insulin pump     oxyCODONE (OXY IR/ROXICODONE) 5 MG immediate release tablet Take 1 tablet (5 mg total) by mouth every 4 (four) hours as needed for severe pain. 30 tablet 0   POTASSIUM CHLORIDE PO Take 1 tablet by mouth daily. [employer] brand     SUMAtriptan (IMITREX) 50 MG tablet Take 50 mg by mouth daily as needed for migraine.     Vitamin D, Ergocalciferol, (DRISDOL) 1.25 MG (50000 UNIT) CAPS capsule Take 50,000  Units by mouth once a week.     No current facility-administered medications for this visit.     Abtx:  Anti-infectives (From admission, onward)    None       REVIEW OF SYSTEMS:  Const: negative fever, negative chills, negative weight loss Eyes: negative diplopia or visual changes, negative eye pain ENT: negative coryza, negative sore throat Resp: negative cough, hemoptysis, dyspnea Cards: negative for chest pain, palpitations, lower extremity edema GU: negative for frequency, dysuria and hematuria GI: as above Skin: negative for rash and pruritus Heme: negative for easy bruising and gum/nose bleeding MS: negative for myalgias, arthralgias, back pain and muscle weakness Neurolo:negative for headaches, dizziness, vertigo, memory problems  Psych: negative for feelings of anxiety, depression  Endocrine: , diabetes Allergy/Immunology- negative for any medication or food allergies  Objective:  VITALS:  none   limited because of  telemed General: Alert, cooperative, no distress, appears stated age.  Head: Normocephalic, without obvious abnormality, atraumatic. RS/CVS/Abd not examined .Extremities: left foot reviewed thru video 12/29/22     Jan 2024      Neurologic: Grossly non-focal Pertinent Labs none  ? Impression/Recommendation ?DM with  Left foot infection due to MSSA- s/p great toe amputation- received 4 weeks of Iv cefazolin- 12/21/22 for chronic osteo at the proximal margin- healed well- infection resolved- no further antibiotic needed steristrips Follow up with podiatrist tomorrow  Type I DM- on insulin  ESRD - on hemodialysis after malfunctioning of PD  Discussed the management with the patient Follow PRN? ? _Total time spent on the video visit 16 min Note:  This document was prepared using Dragon voice recognition software and may include unintentional dictation errors.

## 2022-12-30 ENCOUNTER — Encounter: Payer: Self-pay | Admitting: Surgery

## 2022-12-30 ENCOUNTER — Ambulatory Visit (INDEPENDENT_AMBULATORY_CARE_PROVIDER_SITE_OTHER): Payer: Commercial Managed Care - PPO | Admitting: Surgery

## 2022-12-30 VITALS — BP 142/87 | HR 102 | Temp 99.6°F | Ht 66.0 in | Wt 165.6 lb

## 2022-12-30 DIAGNOSIS — T85611A Breakdown (mechanical) of intraperitoneal dialysis catheter, initial encounter: Secondary | ICD-10-CM

## 2022-12-30 DIAGNOSIS — T85611D Breakdown (mechanical) of intraperitoneal dialysis catheter, subsequent encounter: Secondary | ICD-10-CM | POA: Diagnosis not present

## 2022-12-30 NOTE — Patient Instructions (Addendum)
Our surgery scheduler Pamala Hurry will call you within 24-48 hours to get you scheduled. If you have not heard from her after 48 hours, please call our office. Have the blue sheet available when she calls to write down important information.   If you have any concerns or questions, please feel free to call our office.   Hemodialysis, Care After What can I expect after the procedure? After hemodialysis, it is common to: Feel tired. This will often go away the day after the procedure. Feel itchy. Your doctor may give you medicine to help with this problem. Have achy or jumpy legs. You may feel like you need to kick your legs. This sometimes causes sleep problems. Follow these instructions at home: Your doctor may give you more specific instructions. If you have problems, call your doctor. Medicines Take over-the-counter and prescription medicines only as told by your doctor. This includes vitamin and mineral supplements. Talk with your doctor before taking new supplements. If told, take steps to prevent problems with pooping (constipation). You may need to: Take medicines. You will be told what medicines to take. Eat foods that are high in fiber. These include beans, whole grains, and fresh fruits and vegetables. Limit foods that are high in fat and sugar. These include fried or sweet foods. Eating and drinking  Follow instructions from your doctor about what you cannot eat or drink. You may need to be on a special diet. Talk with your doctor about how much fluid you can drink. Track how much fluid you drink so you do not drink more than you should. This is important. Fluid can build up in your body between dialysis sessions. Too much fluid can: Affect blood pressure. Make your heart work harder. Limit how much salt (sodium) you have. Salt causes thirst, which makes you want to drink more water. You need to limit how much fluid you can drink each day. Salt can change blood pressure. Less salt in  your diet may help you keep a healthy blood pressure. Limit how much potassium you have. Levels of this mineral can rise between dialysis sessions. High levels can cause a dangerous heart rhythm. This can threaten your life. Limit how much phosphorus you have. Too much phosphorus can make bones lose calcium. This makes bones weaker and more likely to break. Too much phosphorus may make your skin itch. Eat proteins that are low in phosphorus. These include proteins in fish, poultry, eggs, beans, and lentils. Do not drink alcohol. Alcohol can make your kidney failure worse. Activity Rest as told by your doctor. Return to your normal activities when your doctor says that it is safe. General instructions  Carry an item that shows you get dialysis. The item may be a wallet card, a bracelet, or a medical identification tag. Always keep the item with you. This helps if an accident or medical emergency happens. If you cannot speak, the item will let doctors know about your condition. Keep all dialysis visits. This is important. Do not skip a dialysis session. Contact a doctor if: You have a fever or chills. You have any of these signs of infection in your access site: Redness, swelling, or pain. Fluid or blood. Warmth. Pus or a bad smell. Get help right away if: You have a fistula or graft, and you have these symptoms: The hand with the fistula becomes painful, loses feeling (gets numb), turns blue, or gets very pale. You do not feel a thrill in the fistula or graft. A  thrill is a vibration. It shows the access site is working. You get dizzy or weak, and you did not have this before. You get short of breath or get chest pain. You have bleeding at your access site that is hard to control. You get confused. This means that you do not know the time of day, where you are, or who you are. You are not able to see well. You have a seizure. These symptoms may be an emergency. Get help right away. Call  your local emergency services (911 in the [place] Do not wait to see if the symptoms will go away. Do not drive yourself to the hospital. Summary After the procedure, it is common to feel tired, feel itchy, and have achy or jumpy legs. Follow instructions on what to eat and drink. Track how much fluid you drink. Limit some minerals you are getting. Eat good proteins. Tell your doctor if you have problems at the access site, including warmth, redness, swelling, fluid, blood, pus, or a bad smell. Get help right away if you have pain in your hand or if you lose feeling in your hand. This information is not intended to replace advice given to you by your health care provider. Make sure you discuss any questions you have with your health care provider. Document Revised: 06/12/2020 Document Reviewed: 06/12/2020 Elsevier Patient Education  [place].

## 2022-12-30 NOTE — Progress Notes (Signed)
12/30/2022  History of Present Illness: Emma Thomas is a 27 y.o. female status post laparoscopic removal of right-sided peritoneal dialysis catheter and laparoscopic placement of new left-sided peritoneal dialysis catheter on 12/15/2022.  Her right-sided catheter had continued to malfunction despite of revision in the past.  The patient reports that after her surgery, she has been having a lot of pain in the left side around the insertion site but also around a site where we tacked the colon to the left side abdominal wall.  The patient reports that she tried using her catheter recently and she had a lot of pain during flushing and instillation of the fluid.  Overall she reports that she would like to have the catheter removed and just transition to home hemodialysis.  She does have a right upper extremity AV fistula which is working well.  Past Medical History: Past Medical History:  Diagnosis Date   Anemia    Diabetes mellitus without complication ([place])    Type 1 DM   Essential hypertension    Headache    Hypertension 03/04/2013   Neurologic disorder    Both feet   Recurrent UTI    Renal disorder      Past Surgical History: Past Surgical History:  Procedure Laterality Date   abscess removal     excision of bartholin cyst   AMPUTATION Left 11/23/2022   Procedure: LEFT GREAT TOE PARTIAL RAY AMPUTATION;  Surgeon: Sharlotte Alamo, DPM;  Location: [facility];  Service: Podiatry;  Laterality: Left;   CAPD INSERTION N/A 06/09/2022   Procedure: LAPAROSCOPIC INSERTION CONTINUOUS AMBULATORY PERITONEAL DIALYSIS  (CAPD) CATHETER, PD rep to be present;  Surgeon: Olean Ree, MD;  Location: [facility];  Service: General;  Laterality: N/A;   CAPD INSERTION N/A 12/15/2022   Procedure: LAPAROSCOPIC INSERTION CONTINUOUS AMBULATORY PERITONEAL DIALYSIS  (CAPD) CATHETER;  Surgeon: Olean Ree, MD;  Location: [facility];  Service: General;  Laterality: N/A;   CAPD REMOVAL N/A 12/15/2022   Procedure:  LAPAROSCOPIC REMOVAL CONTINUOUS AMBULATORY PERITONEAL DIALYSIS  (CAPD) CATHETER, removal of old catheter;  Surgeon: Olean Ree, MD;  Location: [facility];  Service: General;  Laterality: N/A;   CAPD REVISION N/A 07/16/2022   Procedure: LAPAROSCOPIC REVISION CONTINUOUS AMBULATORY PERITONEAL DIALYSIS  (CAPD) CATHETER;  Surgeon: Olean Ree, MD;  Location: [facility];  Service: General;  Laterality: N/A;   CAPD REVISION N/A 11/24/2022   Procedure: LAPAROSCOPIC REVISION CONTINUOUS AMBULATORY PERITONEAL DIALYSIS  (CAPD) CATHETER;  Surgeon: Olean Ree, MD;  Location: [facility];  Service: General;  Laterality: N/A;   DIALYSIS/PERMA CATHETER INSERTION N/A 04/24/2022   Procedure: DIALYSIS/PERMA CATHETER INSERTION;  Surgeon: Katha Cabal, MD;  Location: [name] [facility];  Service: Cardiovascular;  Laterality: N/A;   DIALYSIS/PERMA CATHETER REMOVAL N/A 06/02/2022   Procedure: DIALYSIS/PERMA CATHETER REMOVAL;  Surgeon: Katha Cabal, MD;  Location: [place] [facility];  Service: Cardiovascular;  Laterality: N/A;   DIALYSIS/PERMA CATHETER REMOVAL N/A 12/28/2022   Procedure: DIALYSIS/PERMA CATHETER REMOVAL;  Surgeon: Algernon Huxley, MD;  Location: [place] [facility];  Service: Cardiovascular;  Laterality: N/A;   INSERTION OF DIALYSIS CATHETER Right    ARM   IR FLUORO GUIDE CV LINE RIGHT  11/27/2022   IR US GUIDE VASC ACCESS RIGHT  11/27/2022   Nexplanon  [id number]   UMBILICAL HERNIA REPAIR N/A 06/09/2022   Procedure: HERNIA REPAIR UMBILICAL ADULT;  Surgeon: Olean Ree, MD;  Location: [facility];  Service: General;  Laterality: N/A;    Home Medications: Prior to  Admission medications   Medication Sig Start Date End Date Taking? Authorizing Provider  acetaminophen (TYLENOL) 500 MG tablet Take 2 tablets (1,000 mg total) by mouth every 6 (six) hours as needed for mild pain. 07/16/22  Yes Cleaven Demario, Jacqulyn Bath, MD  amLODipine (NORVASC) 10 MG tablet Take 1 tablet (10 mg total) by mouth daily. 04/28/22  Yes  Jennye Boroughs, MD  atorvastatin (LIPITOR) 40 MG tablet Take 80 mg by mouth daily. 01/30/22  Yes [provider]  bumetanide (BUMEX) 1 MG tablet Take 1 mg by mouth 2 (two) times daily. 07/13/22  Yes [provider]  ceFAZolin (ANCEF) 1-4 GM/50ML-% SOLN Inject 50 mLs (1 g total) into the vein daily. 11/28/22  Yes Vasireddy, Grier Mitts, MD  Continuous Blood Gluc Sensor (DEXCOM G6 SENSOR) MISC by Does not apply route.   Yes [provider]  ferrous gluconate (FERGON) 324 MG tablet Take 324 mg by mouth daily with breakfast. 05/03/20  Yes [provider]  furosemide (LASIX) 80 MG tablet Take 80 mg by mouth daily. 08/10/22  Yes [provider]  HUMALOG 100 UNIT/ML injection Inject into the skin. 12/04/22  Yes [provider]  insulin lispro (HUMALOG) 100 UNIT/ML injection Inject 5-10 Units into the skin 3 (three) times daily before meals. Plus sliding scale 1 unit per 50 > 150   Yes [provider]  JUNEL 1/20 1-20 MG-MCG tablet Take 1 tablet by mouth daily. 04/17/22  Yes [provider]  losartan (COZAAR) 100 MG tablet Take 100 mg by mouth daily. 06/04/22  Yes [provider]  montelukast (SINGULAIR) 10 MG tablet Take 10 mg by mouth daily. 04/19/22  Yes [provider]  OVER THE COUNTER MEDICATION Tandem Insulin pump   Yes [provider]  POTASSIUM CHLORIDE PO Take 1 tablet by mouth daily. [facility] brand   Yes [provider]  SUMAtriptan (IMITREX) 50 MG tablet Take 50 mg by mouth daily as needed for migraine. 11/11/21  Yes [provider]  Vitamin D, Ergocalciferol, (DRISDOL) 1.25 MG (50000 UNIT) CAPS capsule Take 50,000 Units by mouth once a week. 02/05/22  Yes [provider]    Allergies: No Known Allergies  Review of Systems: Review of Systems  Constitutional:  Negative for chills and fever.  Respiratory:  Negative for shortness of breath.   Cardiovascular:  Negative for chest pain.   Gastrointestinal:  Positive for abdominal pain. Negative for constipation, diarrhea, nausea and vomiting.  Genitourinary:  Negative for dysuria.  Musculoskeletal:  Negative for myalgias.    Physical Exam BP (!) 142/87   Pulse (!) 102   Temp 99.6 F (37.6 C) (Oral)   Ht 5' 6"$  (1.676 m)   Wt 165 lb 9.6 oz (75.1 kg)   LMP 12/08/2022 (Exact Date)   SpO2 98%   BMI 26.73 kg/m  CONSTITUTIONAL: No acute distress, well-nourished HEENT:  Normocephalic, atraumatic, extraocular motion intact. RESPIRATORY:  Normal respiratory effort without pathologic use of accessory muscles. CARDIOVASCULAR: Regular rhythm and rate. GI: The abdomen is soft, nondistended, with some tenderness to palpation in the left lower quadrant laterally at the site where we did a transfascial suture to hold the colon to the left abdominal wall.  She does have some appropriate soreness at the incisions for the insertion site.  Incisions are clean, dry, intact without any evidence of infection or induration.  Catheter is in place also without evidence of infection.  NEUROLOGIC:  Motor and sensation is grossly normal.  Cranial nerves are grossly intact. PSYCH:  Alert and oriented to person, place and time. Affect is normal.  Labs/Imaging: Labs from 12/15/2022: Sodium 135, potassium 3.3, chloride 96, CO2 28, BUN 10, creatinine 4, ionized calcium 0.98, hemoglobin 9.9, hematocrit 29,  Assessment and Plan: This is a 27 y.o. female with peritoneal dialysis malfunction despite of different revisions and replacement.  - The patient has had pain and trying to use her new peritoneal dialysis catheter that is located on the left abdomen.  Overall the catheter is not working and the patient is now wanting to have the catheter removed and transition to hemodialysis.  Discussed with patient that unfortunately this may all be related to anatomy and that despite of revisions on the right side and placement of a new catheter on the left side,  we have had success in getting this to work well.  She is fine with doing hemodialysis at home and would like the catheter removed. - Discussed with patient that we can schedule her for laparoscopic assisted peritoneal dialysis catheter removal on 01/08/2023.  Reviewed the surgery at length with her as prior times including the incisions, risks of bleeding, infection, injury to surrounding structures, that this would be an outpatient procedure, postoperative activity restriction, pain control, and she is willing to proceed. - All of her questions have been answered.   Melvyn Neth, [facility]

## 2022-12-30 NOTE — H&P (View-Only) (Signed)
12/30/2022  History of Present Illness: Emma Thomas is a 27 y.o. female status post laparoscopic removal of right-sided peritoneal dialysis catheter and laparoscopic placement of new left-sided peritoneal dialysis catheter on 12/15/2022.  Her right-sided catheter had continued to malfunction despite of revision in the past.  The patient reports that after her surgery, she has been having a lot of pain in the left side around the insertion site but also around a site where we tacked the colon to the left side abdominal wall.  The patient reports that she tried using her catheter recently and she had a lot of pain during flushing and instillation of the fluid.  Overall she reports that she would like to have the catheter removed and just transition to home hemodialysis.  She does have a right upper extremity AV fistula which is working well.  Past Medical History: Past Medical History:  Diagnosis Date   Anemia    Diabetes mellitus without complication ([place])    Type 1 DM   Essential hypertension    Headache    Hypertension 03/04/2013   Neurologic disorder    Both feet   Recurrent UTI    Renal disorder      Past Surgical History: Past Surgical History:  Procedure Laterality Date   abscess removal     excision of bartholin cyst   AMPUTATION Left 11/23/2022   Procedure: LEFT GREAT TOE PARTIAL RAY AMPUTATION;  Surgeon: Sharlotte Alamo, DPM;  Location: [facility];  Service: Podiatry;  Laterality: Left;   CAPD INSERTION N/A 06/09/2022   Procedure: LAPAROSCOPIC INSERTION CONTINUOUS AMBULATORY PERITONEAL DIALYSIS  (CAPD) CATHETER, PD rep to be present;  Surgeon: Olean Ree, MD;  Location: [facility];  Service: General;  Laterality: N/A;   CAPD INSERTION N/A 12/15/2022   Procedure: LAPAROSCOPIC INSERTION CONTINUOUS AMBULATORY PERITONEAL DIALYSIS  (CAPD) CATHETER;  Surgeon: Olean Ree, MD;  Location: [facility];  Service: General;  Laterality: N/A;   CAPD REMOVAL N/A 12/15/2022   Procedure:  LAPAROSCOPIC REMOVAL CONTINUOUS AMBULATORY PERITONEAL DIALYSIS  (CAPD) CATHETER, removal of old catheter;  Surgeon: Olean Ree, MD;  Location: [facility];  Service: General;  Laterality: N/A;   CAPD REVISION N/A 07/16/2022   Procedure: LAPAROSCOPIC REVISION CONTINUOUS AMBULATORY PERITONEAL DIALYSIS  (CAPD) CATHETER;  Surgeon: Olean Ree, MD;  Location: [facility];  Service: General;  Laterality: N/A;   CAPD REVISION N/A 11/24/2022   Procedure: LAPAROSCOPIC REVISION CONTINUOUS AMBULATORY PERITONEAL DIALYSIS  (CAPD) CATHETER;  Surgeon: Olean Ree, MD;  Location: [facility];  Service: General;  Laterality: N/A;   DIALYSIS/PERMA CATHETER INSERTION N/A 04/24/2022   Procedure: DIALYSIS/PERMA CATHETER INSERTION;  Surgeon: Katha Cabal, MD;  Location: [place] [facility];  Service: Cardiovascular;  Laterality: N/A;   DIALYSIS/PERMA CATHETER REMOVAL N/A 06/02/2022   Procedure: DIALYSIS/PERMA CATHETER REMOVAL;  Surgeon: Katha Cabal, MD;  Location: [place] [facility];  Service: Cardiovascular;  Laterality: N/A;   DIALYSIS/PERMA CATHETER REMOVAL N/A 12/28/2022   Procedure: DIALYSIS/PERMA CATHETER REMOVAL;  Surgeon: Algernon Huxley, MD;  Location: [place] [facility];  Service: Cardiovascular;  Laterality: N/A;   INSERTION OF DIALYSIS CATHETER Right    ARM   IR FLUORO GUIDE CV LINE RIGHT  11/27/2022   IR US GUIDE VASC ACCESS RIGHT  11/27/2022   Nexplanon  99991111   UMBILICAL HERNIA REPAIR N/A 06/09/2022   Procedure: HERNIA REPAIR UMBILICAL ADULT;  Surgeon: Olean Ree, MD;  Location: [facility];  Service: General;  Laterality: N/A;    Home Medications: Prior to  Admission medications   Medication Sig Start Date End Date Taking? Authorizing Provider  acetaminophen (TYLENOL) 500 MG tablet Take 2 tablets (1,000 mg total) by mouth every 6 (six) hours as needed for mild pain. 07/16/22  Yes Misao Fackrell, Jacqulyn Bath, MD  amLODipine (NORVASC) 10 MG tablet Take 1 tablet (10 mg total) by mouth daily. 04/28/22  Yes  Jennye Boroughs, MD  atorvastatin (LIPITOR) 40 MG tablet Take 80 mg by mouth daily. 01/30/22  Yes [provider]  bumetanide (BUMEX) 1 MG tablet Take 1 mg by mouth 2 (two) times daily. 07/13/22  Yes [provider]  ceFAZolin (ANCEF) 1-4 GM/50ML-% SOLN Inject 50 mLs (1 g total) into the vein daily. 11/28/22  Yes Vasireddy, Grier Mitts, MD  Continuous Blood Gluc Sensor (DEXCOM G6 SENSOR) MISC by Does not apply route.   Yes [provider]  ferrous gluconate (FERGON) 324 MG tablet Take 324 mg by mouth daily with breakfast. 05/03/20  Yes [provider]  furosemide (LASIX) 80 MG tablet Take 80 mg by mouth daily. 08/10/22  Yes [provider]  HUMALOG 100 UNIT/ML injection Inject into the skin. 12/04/22  Yes [provider]  insulin lispro (HUMALOG) 100 UNIT/ML injection Inject 5-10 Units into the skin 3 (three) times daily before meals. Plus sliding scale 1 unit per 50 > 150   Yes [provider]  JUNEL 1/20 1-20 MG-MCG tablet Take 1 tablet by mouth daily. 04/17/22  Yes [provider]  losartan (COZAAR) 100 MG tablet Take 100 mg by mouth daily. 06/04/22  Yes [provider]  montelukast (SINGULAIR) 10 MG tablet Take 10 mg by mouth daily. 04/19/22  Yes [provider]  OVER THE COUNTER MEDICATION Tandem Insulin pump   Yes [provider]  POTASSIUM CHLORIDE PO Take 1 tablet by mouth daily. [facility] brand   Yes [provider]  SUMAtriptan (IMITREX) 50 MG tablet Take 50 mg by mouth daily as needed for migraine. 11/11/21  Yes [provider]  Vitamin D, Ergocalciferol, (DRISDOL) 1.25 MG (50000 UNIT) CAPS capsule Take 50,000 Units by mouth once a week. 02/05/22  Yes [provider]    Allergies: No Known Allergies  Review of Systems: Review of Systems  Constitutional:  Negative for chills and fever.  Respiratory:  Negative for shortness of breath.   Cardiovascular:  Negative for chest pain.   Gastrointestinal:  Positive for abdominal pain. Negative for constipation, diarrhea, nausea and vomiting.  Genitourinary:  Negative for dysuria.  Musculoskeletal:  Negative for myalgias.    Physical Exam BP (!) 142/87   Pulse (!) 102   Temp 99.6 F (37.6 C) (Oral)   Ht '5\' 6"'$  (1.676 m)   Wt 165 lb 9.6 oz (75.1 kg)   LMP 12/08/2022 (Exact Date)   SpO2 98%   BMI 26.73 kg/m  CONSTITUTIONAL: No acute distress, well-nourished HEENT:  Normocephalic, atraumatic, extraocular motion intact. RESPIRATORY:  Normal respiratory effort without pathologic use of accessory muscles. CARDIOVASCULAR: Regular rhythm and rate. GI: The abdomen is soft, nondistended, with some tenderness to palpation in the left lower quadrant laterally at the site where we did a transfascial suture to hold the colon to the left abdominal wall.  She does have some appropriate soreness at the incisions for the insertion site.  Incisions are clean, dry, intact without any evidence of infection or induration.  Catheter is in place also without evidence of infection.  NEUROLOGIC:  Motor and sensation is grossly normal.  Cranial nerves are grossly intact. PSYCH:  Alert and oriented to person, place and time. Affect is normal.  Labs/Imaging: Labs from 12/15/2022: Sodium 135, potassium 3.3, chloride 96, CO2 28, BUN 10, creatinine 4, ionized calcium 0.98, hemoglobin 9.9, hematocrit 29,  Assessment and Plan: This is a 27 y.o. female with peritoneal dialysis malfunction despite of different revisions and replacement.  - The patient has had pain and trying to use her new peritoneal dialysis catheter that is located on the left abdomen.  Overall the catheter is not working and the patient is now wanting to have the catheter removed and transition to hemodialysis.  Discussed with patient that unfortunately this may all be related to anatomy and that despite of revisions on the right side and placement of a new catheter on the left side,  we have had success in getting this to work well.  She is fine with doing hemodialysis at home and would like the catheter removed. - Discussed with patient that we can schedule her for laparoscopic assisted peritoneal dialysis catheter removal on 01/08/2023.  Reviewed the surgery at length with her as prior times including the incisions, risks of bleeding, infection, injury to surrounding structures, that this would be an outpatient procedure, postoperative activity restriction, pain control, and she is willing to proceed. - All of her questions have been answered.   Melvyn Neth, [facility]

## 2022-12-31 ENCOUNTER — Telehealth: Payer: Self-pay | Admitting: Surgery

## 2022-12-31 NOTE — Telephone Encounter (Signed)
Patient has been advised of Pre-Admission date/time, and Surgery date at [facility].  Surgery Date: 01/08/23 Preadmission Testing Date: 01/05/23 (phone 8a-1p)  Patient has been made aware to call [phone], between 1-3:00pm the day before surgery, to find out what time to arrive for surgery.

## 2023-01-05 ENCOUNTER — Encounter
Admission: RE | Admit: 2023-01-05 | Discharge: 2023-01-05 | Disposition: A | Discharge status: Home / Self Care | Payer: Commercial Managed Care - PPO | Source: Ambulatory Visit | Attending: Surgery | Admitting: Surgery

## 2023-01-05 ENCOUNTER — Other Ambulatory Visit: Payer: Self-pay

## 2023-01-05 DIAGNOSIS — Z01812 Encounter for preprocedural laboratory examination: Secondary | ICD-10-CM

## 2023-01-05 HISTORY — DX: Myoneural disorder, unspecified: G70.9

## 2023-01-05 NOTE — Pre-Procedure Instructions (Signed)
PAT call completed today with patient, she has a tandem insulin pump and a Dexcom Meter, she has been made aware to place these devices away from her abdomin and can place these on her hips, upper thighs or upper forearms. She will reduce her basal rate of Insulin by 20 %, Lauren DM Coordinator for [facility] made aware and agrees.

## 2023-01-05 NOTE — Patient Instructions (Addendum)
Your procedure is scheduled on: 01/08/23 - Friday Report to the Registration Desk on the 1st floor of the [facility]. To find out your arrival time, please call [phone] between 1PM - 3PM on: 01/07/23 - Thursday  If your arrival time is 6:00 am, do not arrive before that time as the [facility] entrance doors do not open until 6:00 am.  REMEMBER: Instructions that are not followed completely may result in serious medical risk, up to and including death; or upon the discretion of your surgeon and anesthesiologist your surgery may need to be rescheduled.  Do not eat food after midnight the night before surgery.  No gum chewing or hard candies.  You may however, drink CLEAR liquids up to 2 hours before you are scheduled to arrive for your surgery. Do not drink anything within 2 hours of your scheduled arrival time.  Clear liquids include: - water   One week prior to surgery: Stop Anti-inflammatories (NSAIDS) such as Advil, Aleve, Ibuprofen, Motrin, Naproxen, Naprosyn and Aspirin based products such as Excedrin, Goody's Powder, BC Powder.  Stop ANY OVER THE COUNTER supplements until after surgery.  You may however, continue to take Tylenol if needed for pain up until the day of surgery.   TAKE ONLY THESE MEDICATIONS THE MORNING OF SURGERY WITH A SIP OF WATER:  amLODipine (NORVASC)  ferrous gluconate    - Patient will place her Dexcom Meter on her upper arms or hips for surgery.  - Patient will place her Tandem Insulin Pump on her thigh or hip and reduce her basal rate by 20%.   No Alcohol for 24 hours before or after surgery.  No Smoking including e-cigarettes for 24 hours before surgery.  No chewable tobacco products for at least 6 hours before surgery.  No nicotine patches on the day of surgery.  Do not use any "recreational" drugs for at least a week (preferably 2 weeks) before your surgery.  Please be advised that the combination of cocaine and anesthesia may have  negative outcomes, up to and including death. If you test positive for cocaine, your surgery will be cancelled.  On the morning of surgery brush your teeth with toothpaste and water, you may rinse your mouth with mouthwash if you wish. Do not swallow any toothpaste or mouthwash.  Use CHG Soap or wipes as directed on instruction sheet.  Do not wear jewelry, make-up, hairpins, clips or nail polish.  Do not wear lotions, powders, or perfumes.   Do not shave body hair from the neck down 48 hours before surgery.  Contact lenses, hearing aids and dentures may not be worn into surgery.  Do not bring valuables to the hospital. [facility] is not responsible for any missing/lost belongings or valuables.   Notify your doctor if there is any change in your medical condition (cold, fever, infection).  Wear comfortable clothing (specific to your surgery type) to the hospital.  After surgery, you can help prevent lung complications by doing breathing exercises.  Take deep breaths and cough every 1-2 hours. Your doctor may order a device called an Incentive Spirometer to help you take deep breaths. When coughing or sneezing, hold a pillow firmly against your incision with both hands. This is called "splinting." Doing this helps protect your incision. It also decreases belly discomfort.  If you are being admitted to the hospital overnight, leave your suitcase in the car. After surgery it may be brought to your room.  In case of increased patient census,  it may be necessary for you, the patient, to continue your postoperative care in the Same Day Surgery department.  If you are being discharged the day of surgery, you will not be allowed to drive home. You will need a responsible individual to drive you home and stay with you for 24 hours after surgery.   If you are taking public transportation, you will need to have a responsible individual with you.  Please call the [facility] Dept. at  [phone] if you have any questions about these instructions.  Surgery Visitation Policy:  Patients undergoing a surgery or procedure may have two family members or support persons with them as long as the person is not COVID-19 positive or experiencing its symptoms.   Inpatient Visitation:    Visiting hours are 7 a.m. to 8 p.m. Up to four visitors are allowed at one time in a patient room. The visitors may rotate out with other people during the day. One designated support person (adult) may remain overnight.  Due to an increase in RSV and influenza rates and associated hospitalizations, children ages 61 and under will not be able to visit patients in [facility].  Masks continue to be strongly recommended.

## 2023-01-08 ENCOUNTER — Other Ambulatory Visit: Payer: Self-pay

## 2023-01-08 ENCOUNTER — Ambulatory Visit
Admission: RE | Admit: 2023-01-08 | Discharge: 2023-01-08 | Disposition: A | Discharge status: Home / Self Care | Payer: Commercial Managed Care - PPO | Attending: Surgery | Admitting: Surgery

## 2023-01-08 ENCOUNTER — Encounter
Admission: RE | Disposition: A | Discharge status: Home / Self Care | Payer: Self-pay | Source: Home / Self Care | Attending: Surgery

## 2023-01-08 ENCOUNTER — Ambulatory Visit: Payer: Commercial Managed Care - PPO | Admitting: Anesthesiology

## 2023-01-08 ENCOUNTER — Encounter: Payer: Self-pay | Admitting: Surgery

## 2023-01-08 DIAGNOSIS — F32A Depression, unspecified: Secondary | ICD-10-CM | POA: Insufficient documentation

## 2023-01-08 DIAGNOSIS — I129 Hypertensive chronic kidney disease with stage 1 through stage 4 chronic kidney disease, or unspecified chronic kidney disease: Secondary | ICD-10-CM | POA: Insufficient documentation

## 2023-01-08 DIAGNOSIS — Z87440 Personal history of urinary (tract) infections: Secondary | ICD-10-CM | POA: Insufficient documentation

## 2023-01-08 DIAGNOSIS — Z992 Dependence on renal dialysis: Secondary | ICD-10-CM | POA: Diagnosis not present

## 2023-01-08 DIAGNOSIS — Z87891 Personal history of nicotine dependence: Secondary | ICD-10-CM | POA: Diagnosis not present

## 2023-01-08 DIAGNOSIS — N189 Chronic kidney disease, unspecified: Secondary | ICD-10-CM | POA: Diagnosis not present

## 2023-01-08 DIAGNOSIS — T85611A Breakdown (mechanical) of intraperitoneal dialysis catheter, initial encounter: Secondary | ICD-10-CM | POA: Insufficient documentation

## 2023-01-08 DIAGNOSIS — Z79899 Other long term (current) drug therapy: Secondary | ICD-10-CM | POA: Insufficient documentation

## 2023-01-08 DIAGNOSIS — E108 Type 1 diabetes mellitus with unspecified complications: Secondary | ICD-10-CM | POA: Insufficient documentation

## 2023-01-08 DIAGNOSIS — E1022 Type 1 diabetes mellitus with diabetic chronic kidney disease: Secondary | ICD-10-CM | POA: Insufficient documentation

## 2023-01-08 DIAGNOSIS — Z794 Long term (current) use of insulin: Secondary | ICD-10-CM | POA: Diagnosis not present

## 2023-01-08 DIAGNOSIS — Y828 Other medical devices associated with adverse incidents: Secondary | ICD-10-CM | POA: Insufficient documentation

## 2023-01-08 DIAGNOSIS — Z01812 Encounter for preprocedural laboratory examination: Secondary | ICD-10-CM

## 2023-01-08 HISTORY — PX: CAPD REMOVAL: SHX5234

## 2023-01-08 LAB — POCT I-STAT, CHEM 8
BUN: 29 mg/dL — ABNORMAL HIGH (ref 6–20)
Calcium, Ion: 0.97 mmol/L — ABNORMAL LOW (ref 1.15–1.40)
Chloride: 100 mmol/L (ref 98–111)
Creatinine, Ser: 6 mg/dL — ABNORMAL HIGH (ref 0.44–1.00)
Glucose, Bld: 133 mg/dL — ABNORMAL HIGH (ref 70–99)
HCT: 26 % — ABNORMAL LOW (ref 36.0–46.0)
Hemoglobin: 8.8 g/dL — ABNORMAL LOW (ref 12.0–15.0)
Potassium: 3.3 mmol/L — ABNORMAL LOW (ref 3.5–5.1)
Sodium: 136 mmol/L (ref 135–145)
TCO2: 27 mmol/L (ref 22–32)

## 2023-01-08 LAB — GLUCOSE, CAPILLARY: Glucose-Capillary: 102 mg/dL — ABNORMAL HIGH (ref 70–99)

## 2023-01-08 LAB — POCT PREGNANCY, URINE: Preg Test, Ur: NEGATIVE

## 2023-01-08 SURGERY — LAPAROSCOPIC REMOVAL CONTINUOUS AMBULATORY PERITONEAL DIALYSIS  (CAPD) CATHETER
Anesthesia: General

## 2023-01-08 MED ORDER — OXYCODONE HCL 5 MG PO TABS: 5.0000 mg | ORAL_TABLET | ORAL | 0 refills | Status: DC | PRN

## 2023-01-08 MED ORDER — ACETAMINOPHEN 10 MG/ML IV SOLN
INTRAVENOUS | Status: AC
  Filled 2023-01-08: qty 100

## 2023-01-08 MED ORDER — LIDOCAINE HCL (CARDIAC) PF 100 MG/5ML IV SOSY
PREFILLED_SYRINGE | INTRAVENOUS | Status: DC | PRN
  Administered 2023-01-08: 100 mg via INTRAVENOUS

## 2023-01-08 MED ORDER — ACETAMINOPHEN 500 MG PO TABS: 1000.0000 mg | ORAL_TABLET | Freq: Four times a day (QID) | ORAL | Status: DC | PRN

## 2023-01-08 MED ORDER — OXYCODONE HCL 5 MG PO TABS
5.0000 mg | ORAL_TABLET | ORAL | Status: DC | PRN
  Administered 2023-01-08: 5 mg via ORAL

## 2023-01-08 MED ORDER — LACTATED RINGERS IV SOLN: INTRAVENOUS | Status: DC | PRN

## 2023-01-08 MED ORDER — BUPIVACAINE HCL (PF) 0.5 % IJ SOLN
INTRAMUSCULAR | Status: AC
  Filled 2023-01-08: qty 30

## 2023-01-08 MED ORDER — SODIUM CHLORIDE 0.9 % IV SOLN: INTRAVENOUS | Status: DC

## 2023-01-08 MED ORDER — OXYCODONE HCL 5 MG PO TABS
ORAL_TABLET | ORAL | Status: AC
  Filled 2023-01-08: qty 1

## 2023-01-08 MED ORDER — PROPOFOL 10 MG/ML IV BOLUS
INTRAVENOUS | Status: DC | PRN
  Administered 2023-01-08: 90 mg via INTRAVENOUS
  Administered 2023-01-08: 50 mg via INTRAVENOUS

## 2023-01-08 MED ORDER — FAMOTIDINE 20 MG PO TABS: 20.0000 mg | ORAL_TABLET | Freq: Once | ORAL | Status: AC

## 2023-01-08 MED ORDER — CHLORHEXIDINE GLUCONATE CLOTH 2 % EX PADS: 6.0000 | MEDICATED_PAD | Freq: Once | CUTANEOUS | Status: DC

## 2023-01-08 MED ORDER — PHENYLEPHRINE HCL (PRESSORS) 10 MG/ML IV SOLN
INTRAVENOUS | Status: DC | PRN
  Administered 2023-01-08 (×2): 80 ug via INTRAVENOUS

## 2023-01-08 MED ORDER — CEFAZOLIN SODIUM-DEXTROSE 2-4 GM/100ML-% IV SOLN
INTRAVENOUS | Status: AC
  Filled 2023-01-08: qty 100

## 2023-01-08 MED ORDER — BUPIVACAINE LIPOSOME 1.3 % IJ SUSP
INTRAMUSCULAR | Status: AC
  Filled 2023-01-08: qty 10

## 2023-01-08 MED ORDER — GABAPENTIN 100 MG PO CAPS
ORAL_CAPSULE | ORAL | Status: AC
  Administered 2023-01-08: 200 mg via ORAL
  Filled 2023-01-08: qty 2

## 2023-01-08 MED ORDER — ONDANSETRON HCL 4 MG/2ML IJ SOLN
INTRAMUSCULAR | Status: DC | PRN
  Administered 2023-01-08: 4 mg via INTRAVENOUS

## 2023-01-08 MED ORDER — MIDAZOLAM HCL 2 MG/2ML IJ SOLN
INTRAMUSCULAR | Status: DC | PRN
  Administered 2023-01-08: 2 mg via INTRAVENOUS

## 2023-01-08 MED ORDER — GABAPENTIN 100 MG PO CAPS: 200.0000 mg | ORAL_CAPSULE | ORAL | Status: AC

## 2023-01-08 MED ORDER — FAMOTIDINE 20 MG PO TABS
ORAL_TABLET | ORAL | Status: AC
  Administered 2023-01-08: 20 mg via ORAL
  Filled 2023-01-08: qty 1

## 2023-01-08 MED ORDER — CHLORHEXIDINE GLUCONATE 0.12 % MT SOLN
OROMUCOSAL | Status: AC
  Administered 2023-01-08: 15 mL via OROMUCOSAL
  Filled 2023-01-08: qty 15

## 2023-01-08 MED ORDER — ONDANSETRON HCL 4 MG/2ML IJ SOLN
INTRAMUSCULAR | Status: AC
  Filled 2023-01-08: qty 2

## 2023-01-08 MED ORDER — DEXAMETHASONE SODIUM PHOSPHATE 10 MG/ML IJ SOLN
INTRAMUSCULAR | Status: DC | PRN
  Administered 2023-01-08: 10 mg via INTRAVENOUS

## 2023-01-08 MED ORDER — MIDAZOLAM HCL 2 MG/2ML IJ SOLN
INTRAMUSCULAR | Status: AC
  Filled 2023-01-08: qty 2

## 2023-01-08 MED ORDER — ORAL CARE MOUTH RINSE: 15.0000 mL | Freq: Once | OROMUCOSAL | Status: AC

## 2023-01-08 MED ORDER — ONDANSETRON HCL 4 MG/2ML IJ SOLN
4.0000 mg | Freq: Once | INTRAMUSCULAR | Status: AC | PRN
  Administered 2023-01-08: 4 mg via INTRAVENOUS

## 2023-01-08 MED ORDER — BUPIVACAINE LIPOSOME 1.3 % IJ SUSP
INTRAMUSCULAR | Status: DC | PRN
  Administered 2023-01-08: 10 mL

## 2023-01-08 MED ORDER — FENTANYL CITRATE (PF) 100 MCG/2ML IJ SOLN
INTRAMUSCULAR | Status: AC
  Administered 2023-01-08: 25 ug via INTRAVENOUS
  Filled 2023-01-08: qty 2

## 2023-01-08 MED ORDER — PHENYLEPHRINE HCL-NACL 20-0.9 MG/250ML-% IV SOLN
INTRAVENOUS | Status: AC
  Filled 2023-01-08: qty 250

## 2023-01-08 MED ORDER — CHLORHEXIDINE GLUCONATE CLOTH 2 % EX PADS
6.0000 | MEDICATED_PAD | Freq: Once | CUTANEOUS | Status: AC
  Administered 2023-01-08: 6 via TOPICAL

## 2023-01-08 MED ORDER — DEXMEDETOMIDINE HCL IN NACL 80 MCG/20ML IV SOLN
INTRAVENOUS | Status: DC | PRN
  Administered 2023-01-08 (×3): 4 ug via BUCCAL

## 2023-01-08 MED ORDER — SUCCINYLCHOLINE CHLORIDE 200 MG/10ML IV SOSY
PREFILLED_SYRINGE | INTRAVENOUS | Status: DC | PRN
  Administered 2023-01-08: 100 mg via INTRAVENOUS

## 2023-01-08 MED ORDER — ACETAMINOPHEN 500 MG PO TABS: 1000.0000 mg | ORAL_TABLET | ORAL | Status: AC

## 2023-01-08 MED ORDER — FENTANYL CITRATE (PF) 100 MCG/2ML IJ SOLN
INTRAMUSCULAR | Status: AC
  Filled 2023-01-08: qty 2

## 2023-01-08 MED ORDER — CEFAZOLIN SODIUM-DEXTROSE 2-4 GM/100ML-% IV SOLN
2.0000 g | INTRAVENOUS | Status: AC
  Administered 2023-01-08: 2 g via INTRAVENOUS

## 2023-01-08 MED ORDER — FENTANYL CITRATE (PF) 100 MCG/2ML IJ SOLN
INTRAMUSCULAR | Status: DC | PRN
  Administered 2023-01-08 (×4): 50 ug via INTRAVENOUS

## 2023-01-08 MED ORDER — KETAMINE HCL 50 MG/5ML IJ SOSY
PREFILLED_SYRINGE | INTRAMUSCULAR | Status: AC
  Filled 2023-01-08: qty 5

## 2023-01-08 MED ORDER — BUPIVACAINE-EPINEPHRINE 0.5% -1:200000 IJ SOLN
INTRAMUSCULAR | Status: DC | PRN
  Administered 2023-01-08: 30 mL

## 2023-01-08 MED ORDER — ROCURONIUM BROMIDE 10 MG/ML (PF) SYRINGE
PREFILLED_SYRINGE | INTRAVENOUS | Status: AC
  Filled 2023-01-08: qty 20

## 2023-01-08 MED ORDER — ACETAMINOPHEN 500 MG PO TABS
ORAL_TABLET | ORAL | Status: AC
  Administered 2023-01-08: 1000 mg via ORAL
  Filled 2023-01-08: qty 2

## 2023-01-08 MED ORDER — CHLORHEXIDINE GLUCONATE 0.12 % MT SOLN: 15.0000 mL | Freq: Once | OROMUCOSAL | Status: AC

## 2023-01-08 MED ORDER — FENTANYL CITRATE (PF) 100 MCG/2ML IJ SOLN
25.0000 ug | INTRAMUSCULAR | Status: DC | PRN
  Administered 2023-01-08 (×3): 25 ug via INTRAVENOUS

## 2023-01-08 MED ORDER — BUPIVACAINE LIPOSOME 1.3 % IJ SUSP: 20.0000 mL | Freq: Once | INTRAMUSCULAR | Status: DC

## 2023-01-08 SURGICAL SUPPLY — 35 items
CHLORAPREP W/TINT 26 (MISCELLANEOUS) ×1 IMPLANT
DERMABOND ADVANCED .7 DNX12 (GAUZE/BANDAGES/DRESSINGS) ×1 IMPLANT
ELECT CAUTERY BLADE TIP 2.5 (TIP) ×1
ELECT REM PT RETURN 9FT ADLT (ELECTROSURGICAL) ×1
ELECTRODE CAUTERY BLDE TIP 2.5 (TIP) ×1 IMPLANT
ELECTRODE REM PT RTRN 9FT ADLT (ELECTROSURGICAL) ×1 IMPLANT
GLOVE SURG SYN 7.0 (GLOVE) ×3 IMPLANT
GLOVE SURG SYN 7.0 PF PI (GLOVE) ×1 IMPLANT
GLOVE SURG SYN 7.5  E (GLOVE) ×3
GLOVE SURG SYN 7.5 E (GLOVE) ×3 IMPLANT
GLOVE SURG SYN 7.5 PF PI (GLOVE) ×1 IMPLANT
GOWN STRL REUS W/ TWL LRG LVL3 (GOWN DISPOSABLE) ×2 IMPLANT
GOWN STRL REUS W/TWL LRG LVL3 (GOWN DISPOSABLE) ×3
KIT TURNOVER KIT A (KITS) ×1 IMPLANT
LABEL OR SOLS (LABEL) ×1 IMPLANT
MANIFOLD NEPTUNE II (INSTRUMENTS) ×1 IMPLANT
NDL INSUFFLATION 14GA 120MM (NEEDLE) IMPLANT
NEEDLE HYPO 22GX1.5 SAFETY (NEEDLE) ×1 IMPLANT
NEEDLE INSUFFLATION 14GA 120MM (NEEDLE) ×1 IMPLANT
NS IRRIG 500ML POUR BTL (IV SOLUTION) ×1 IMPLANT
PACK LAP CHOLECYSTECTOMY (MISCELLANEOUS) ×1 IMPLANT
SCISSORS METZENBAUM CVD 33 (INSTRUMENTS) IMPLANT
SET TUBE SMOKE EVAC HIGH FLOW (TUBING) ×1 IMPLANT
SLEEVE ADV FIXATION 5X100MM (TROCAR) IMPLANT
SUT MNCRL 4-0 (SUTURE) ×2
SUT MNCRL 4-0 27XMFL (SUTURE) ×2
SUT VIC AB 3-0 SH 27 (SUTURE) ×1
SUT VIC AB 3-0 SH 27X BRD (SUTURE) IMPLANT
SUT VICRYL 0 UR6 27IN ABS (SUTURE) ×1 IMPLANT
SUTURE MNCRL 4-0 27XMF (SUTURE) ×1 IMPLANT
SYS KII FIOS ACCESS ABD 5X100 (TROCAR) ×1
SYSTEM KII FIOS ACES ABD 5X100 (TROCAR) ×1 IMPLANT
TRAP FLUID SMOKE EVACUATOR (MISCELLANEOUS) ×1 IMPLANT
TROCAR BALLN GELPORT 12X130M (ENDOMECHANICALS) ×1 IMPLANT
WATER STERILE IRR 500ML POUR (IV SOLUTION) ×1 IMPLANT

## 2023-01-08 NOTE — Discharge Instructions (Signed)

## 2023-01-08 NOTE — Transfer of Care (Signed)
Immediate Anesthesia Transfer of Care Note  Patient: Emma Thomas  Procedure(s) Performed: LAPAROSCOPIC REMOVAL CONTINUOUS AMBULATORY PERITONEAL DIALYSIS  (CAPD) CATHETER  Patient Location: PACU  Anesthesia Type:General  Level of Consciousness: alert , patient cooperative, and responds to stimulation  Airway & Oxygen Therapy: Patient Spontanous Breathing and Patient connected to face mask oxygen  Post-op Assessment: Report given to RN and Post -op Vital signs reviewed and stable  Post vital signs: Reviewed and stable  Last Vitals:  Vitals Value Taken Time  BP 135/72 01/08/23 1426  Temp    Pulse 85 01/08/23 1428  Resp 19 01/08/23 1428  SpO2 100 % 01/08/23 1428  Vitals shown include unvalidated device data.  Last Pain:  Vitals:   01/08/23 1146  TempSrc: Oral         Complications: No notable events documented.

## 2023-01-08 NOTE — Anesthesia Procedure Notes (Signed)
Procedure Name: Intubation Date/Time: 01/08/2023 1:10 PM  Performed by: Patience Musca., CRNAPre-anesthesia Checklist: Patient identified, Patient being monitored, Timeout performed, Emergency Drugs available and Suction available Patient Re-evaluated:Patient Re-evaluated prior to induction Oxygen Delivery Method: Circle system utilized Preoxygenation: Pre-oxygenation with 100% oxygen Induction Type: IV induction Ventilation: Mask ventilation without difficulty Laryngoscope Size: 3 and McGraph Grade View: Grade I Tube type: Oral Tube size: 6.5 mm Number of attempts: 1 Airway Equipment and Method: Stylet Placement Confirmation: ETT inserted through vocal cords under direct vision, positive ETCO2 and breath sounds checked- equal and bilateral Secured at: 19 cm Tube secured with: Tape Dental Injury: Teeth and Oropharynx as per pre-operative assessment

## 2023-01-08 NOTE — Anesthesia Preprocedure Evaluation (Signed)
Anesthesia Evaluation  Patient identified by MRN, date of birth, ID band Patient awake    Reviewed: Allergy & Precautions, NPO status , Patient's Chart, lab work & pertinent test results  Airway Mallampati: III  TM Distance: >3 FB Neck ROM: Full    Dental  (+) Chipped   Pulmonary neg pulmonary ROS   Pulmonary exam normal breath sounds clear to auscultation       Cardiovascular Exercise Tolerance: Good hypertension, Pt. on medications negative cardio ROS Normal cardiovascular exam Rhythm:Regular     Neuro/Psych  Headaches   Depression    negative neurological ROS  negative psych ROS   GI/Hepatic negative GI ROS, Neg liver ROS,,,  Endo/Other  negative endocrine ROSdiabetes, Well Controlled, Type 1, Insulin Dependent    Renal/GU DialysisRenal disease  negative genitourinary   Musculoskeletal   Abdominal Normal abdominal exam  (+)   Peds negative pediatric ROS (+)  Hematology negative hematology ROS (+) Blood dyscrasia, anemia   Anesthesia Other Findings Past Medical History: No date: Anemia No date: Diabetes mellitus without complication (HCC)     Comment:  Type 1 DM No date: Essential hypertension No date: Headache 03/04/2013: Hypertension No date: Neurologic disorder     Comment:  Both feet No date: Neuromuscular disorder (HCC) No date: Recurrent UTI No date: Renal disorder  Past Surgical History: No date: abscess removal     Comment:  excision of bartholin cyst 11/23/2022: AMPUTATION; Left     Comment:  Procedure: LEFT GREAT TOE PARTIAL RAY AMPUTATION;                Surgeon: Sharlotte Alamo, DPM;  Location: [facility] ORS;  Service:              Podiatry;  Laterality: Left; 06/09/2022: CAPD INSERTION; N/A     Comment:  Procedure: LAPAROSCOPIC INSERTION CONTINUOUS AMBULATORY               PERITONEAL DIALYSIS  (CAPD) CATHETER, PD rep to be               present;  Surgeon: Olean Ree, MD;  Location: [facility]                ORS;  Service: General;  Laterality: N/A; 12/15/2022: CAPD INSERTION; N/A     Comment:  Procedure: LAPAROSCOPIC INSERTION CONTINUOUS AMBULATORY               PERITONEAL DIALYSIS  (CAPD) CATHETER;  Surgeon: Olean Ree, MD;  Location: [facility] ORS;  Service: General;                Laterality: N/A; 12/15/2022: CAPD REMOVAL; N/A     Comment:  Procedure: LAPAROSCOPIC REMOVAL CONTINUOUS AMBULATORY               PERITONEAL DIALYSIS  (CAPD) CATHETER, removal of old               catheter;  Surgeon: Olean Ree, MD;  Location: [facility]               ORS;  Service: General;  Laterality: N/A; 07/16/2022: CAPD REVISION; N/A     Comment:  Procedure: LAPAROSCOPIC REVISION CONTINUOUS AMBULATORY               PERITONEAL DIALYSIS  (CAPD) CATHETER;  Surgeon: Olean Ree, MD;  Location: [facility] ORS;  Service: General;                Laterality: N/A; 11/24/2022: CAPD REVISION; N/A     Comment:  Procedure: LAPAROSCOPIC REVISION CONTINUOUS AMBULATORY               PERITONEAL DIALYSIS  (CAPD) CATHETER;  Surgeon: Olean Ree, MD;  Location: [facility] ORS;  Service: General;                Laterality: N/A; 04/24/2022: DIALYSIS/PERMA CATHETER INSERTION; N/A     Comment:  Procedure: DIALYSIS/PERMA CATHETER INSERTION;  Surgeon:               Katha Cabal, MD;  Location: [facility];               Service: Cardiovascular;  Laterality: N/A; 06/02/2022: DIALYSIS/PERMA CATHETER REMOVAL; N/A     Comment:  Procedure: DIALYSIS/PERMA CATHETER REMOVAL;  Surgeon:               Katha Cabal, MD;  Location: [facility];               Service: Cardiovascular;  Laterality: N/A; 12/28/2022: DIALYSIS/PERMA CATHETER REMOVAL; N/A     Comment:  Procedure: DIALYSIS/PERMA CATHETER REMOVAL;  Surgeon:               Algernon Huxley, MD;  Location: [facility];                Service: Cardiovascular;  Laterality: N/A; No date: INSERTION OF DIALYSIS CATHETER; Right      Comment:  ARM 11/27/2022: IR FLUORO GUIDE CV LINE RIGHT 11/27/2022: IR US GUIDE VASC ACCESS RIGHT 01/2011: Nexplanon [id number]: UMBILICAL HERNIA REPAIR; N/A     Comment:  Procedure: HERNIA REPAIR UMBILICAL ADULT;  Surgeon:               Olean Ree, MD;  Location: [facility] ORS;  Service:               General;  Laterality: N/A;  BMI    Body Mass Index: 26.72 kg/m      Reproductive/Obstetrics negative OB ROS                             Anesthesia Physical Anesthesia Plan  ASA: 4  Anesthesia Plan: General   Post-op Pain Management:    Induction: Intravenous  PONV Risk Score and Plan: Ondansetron, Dexamethasone, Midazolam and Treatment may vary due to age or medical condition  Airway Management Planned: Oral ETT  Additional Equipment:   Intra-op Plan:   Post-operative Plan: Extubation in OR  Informed Consent: I have reviewed the patients History and Physical, chart, labs and discussed the procedure including the risks, benefits and alternatives for the proposed anesthesia with the patient or authorized representative who has indicated his/her understanding and acceptance.     Dental Advisory Given  Plan Discussed with: CRNA and Surgeon  Anesthesia Plan Comments:        Anesthesia Quick Evaluation

## 2023-01-08 NOTE — Op Note (Addendum)
Procedure Date:  01/08/2023  Pre-operative Diagnosis:  Malfunctioning peritoneal dialysis catheter  Post-operative Diagnosis:  Malfunctioning peritoneal dialysis catheter  Procedure:  Laparoscopic assisted lysis of adhesions and peritoneal dialysis catheter removal  Surgeon:  Melvyn Neth, MD  Anesthesia:  General endotracheal  Estimated Blood Loss:  10 ml  Specimens:  None  Complications:  None  Indications for Procedure:  This is a 27 y.o. female with a history of prior laparoscopic PD catheter placement and revisions, with persistent issues with malfunction.  She has decided to transition to hemodialysis and presents for PD catheter removal.  The risks of bleeding, abscess or infection, potential for an open procedure, and injury to surrounding structures were all discussed with the patient and she was willing to proceed.  Description of Procedure: The patient was correctly identified in the preoperative area and brought into the operating room.  The patient was placed supine with VTE prophylaxis in place.  Appropriate time-outs were performed.  Anesthesia was induced and the patient was intubated.  Appropriate antibiotics were infused.  The abdomen was prepped and draped in a sterile fashion.  An incision was made in the left upper quadrant at the location of a prior tunneling incision.  A Veress needle was introduced and pneumoperitoneal was obtained with appropriate opening pressures.  Then two incisions were made in the right upper and right lower quadrants at prior port sites.  A 5 mm port was introduced using Optivue technique and the other one under direct visualization.  The catheter was found to be retracting cephalad and caught in omentum.  This was pulled off the catheter and the catheter was placed back in the pelvic position.  There were two prior transfascial prolene sutures that had been placed to tack the sigmoid colon and the left fallopian tube to the pelvic sidewall  and these were cut using scissors and removed.  The formed adhesions from these structures to the pelvic side wall were freed up from the pelvic sidewall using cautery and blunt dissection without complications.  Then, on the right lower quadrant, a prior prolene suture holding the right fallopian tube in place was also cut and removed and cautery was used to free up the adhesion of the fallopian tube to the pelvic sidewall.  Pneumoperitoneum was then released.    Then, an incision was made in the left abdomen at the site of catheter insertion through the rectus muscle.  Cautery was used to dissect along the subcutaneous tissue and the rectus sheath was reopened at the site of catheter insertion.  The cuff within the rectus muscle was freed and the catheter was pulled out of the abdominal cavity.  Then, at the tunneling counterincision, cautery was also used to mobilize the 2nd cuff and the catheter was then removed in three pieces.  The rectus sheath was closed using 0 Vicryl suture.  The two ports were removed without complications.    Then 40 ml of Exparel mixed with 0.5% bupivacaine with epi  was infiltrated onto the rectus sheath, and all incisions.  The incisions were closed in layers using 3-0 Vicryl and 4-0 Monocryl.  The wounds were cleaned and sealed with DermaBond.  The prior catheter exit site was dressed with a large BandAid.  The patient was emerged from anesthesia and extubated and brought to the recovery room for further management.  The patient tolerated the procedure well and all counts were correct at the end of the case.   Melvyn Neth, MD

## 2023-01-08 NOTE — Interval H&P Note (Signed)
History and Physical Interval Note:  01/08/2023 12:34 PM  Emma Thomas  has presented today for surgery, with the diagnosis of malfunctioning PD catheter.  The various methods of treatment have been discussed with the patient and family. After consideration of risks, benefits and other options for treatment, the patient has consented to  Procedure(s): LAPAROSCOPIC REMOVAL CONTINUOUS AMBULATORY PERITONEAL DIALYSIS  (CAPD) CATHETER (N/A) as a surgical intervention.  The patient's history has been reviewed, patient examined, no change in status, stable for surgery.  I have reviewed the patient's chart and labs.  Questions were answered to the patient's satisfaction.     Deshannon Hinchliffe

## 2023-01-11 ENCOUNTER — Encounter: Payer: Self-pay | Admitting: Surgery

## 2023-01-18 NOTE — Anesthesia Postprocedure Evaluation (Signed)
Anesthesia Post Note  Patient: Emma Thomas  Procedure(s) Performed: LAPAROSCOPIC REMOVAL CONTINUOUS AMBULATORY PERITONEAL DIALYSIS  (CAPD) CATHETER  Patient location during evaluation: PACU Anesthesia Type: General Level of consciousness: awake and alert Pain management: pain level controlled Vital Signs Assessment: post-procedure vital signs reviewed and stable Respiratory status: spontaneous breathing Cardiovascular status: stable Anesthetic complications: no   No notable events documented.   Last Vitals:  Vitals:   01/08/23 1550 01/08/23 1610  BP:  127/85  Pulse: (!) 105 (!) 102  Resp: 19 17  Temp: (!) 36.1 C (!) 36.1 C  SpO2: 100% 100%    Last Pain:  Vitals:   01/08/23 1610  TempSrc: Temporal  PainSc:                  VAN STAVEREN,Johnavon Mcclafferty

## 2023-01-21 ENCOUNTER — Encounter: Payer: Commercial Managed Care - PPO | Admitting: Physician Assistant

## 2023-02-02 DIAGNOSIS — Z01818 Encounter for other preprocedural examination: Secondary | ICD-10-CM | POA: Insufficient documentation

## 2023-03-27 IMAGING — CT CT ABD-PELV W/O CM
2 of 4 series · 16 of 46 positions shown, 18 images · non-contrast
Comparison: None Available.

CLINICAL DATA: Abdominal pain, nausea, vomiting, diarrhea



[Series 2: routine abd/pel wo · axial · 0.67mm/px · z∈[-1221,-776]mm · 13 of 99 slices shown, 15 images]
[im 5/99  soft-tissue]
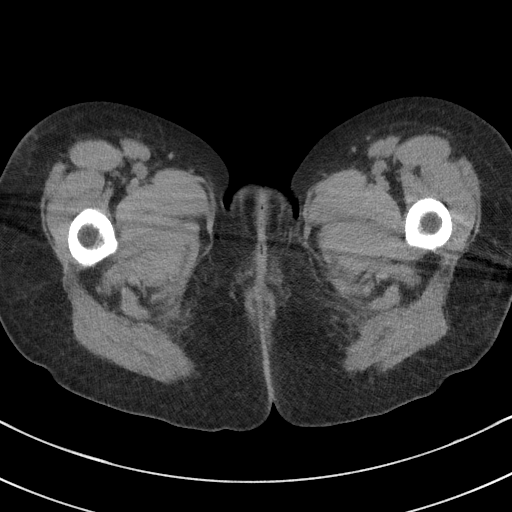
[im 5/99  bone]
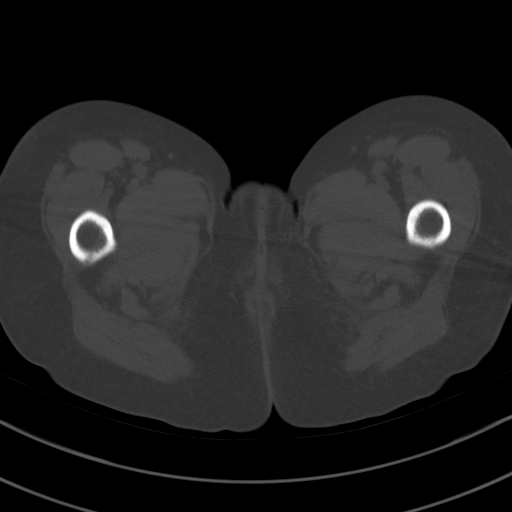
[im 13/99  soft-tissue]
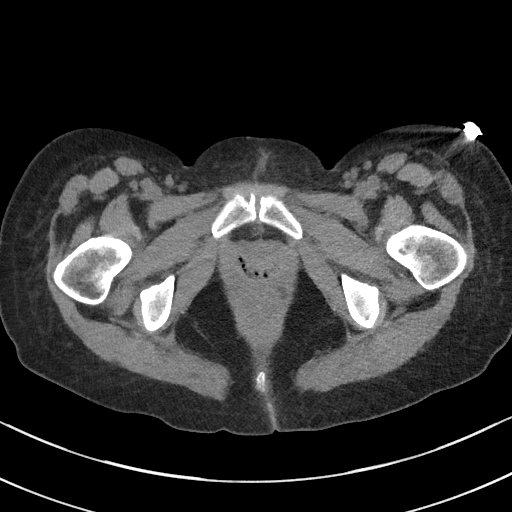
[im 21/99  soft-tissue]
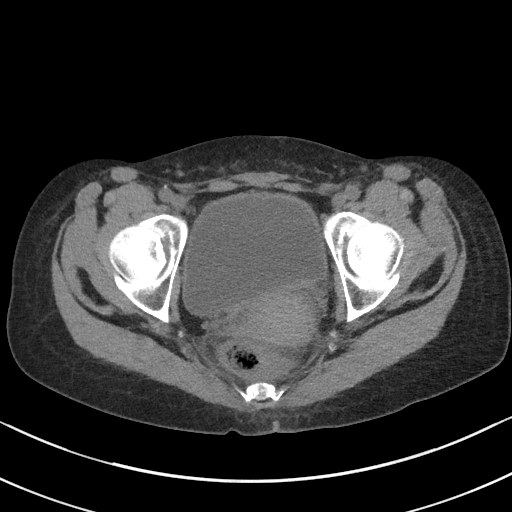
[im 29/99  soft-tissue]
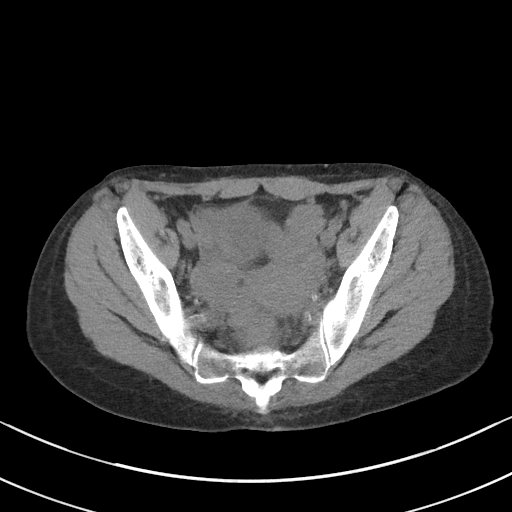
[im 33/99  soft-tissue]
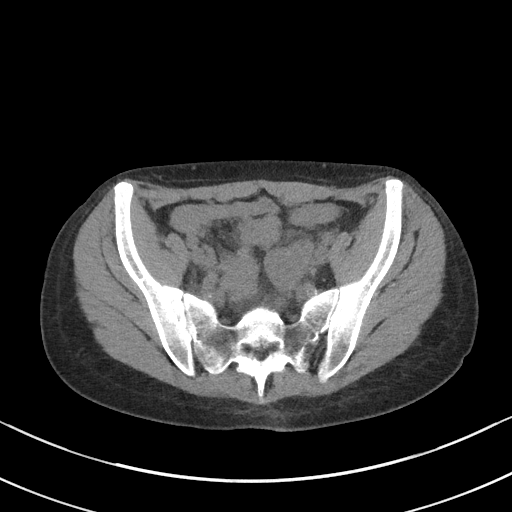
[im 41/99  soft-tissue]
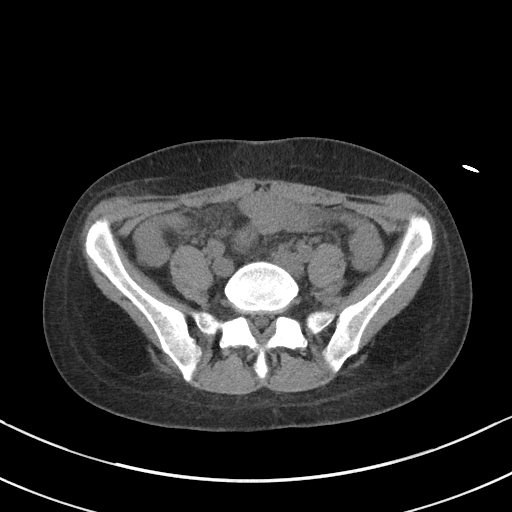
[im 50/99  soft-tissue]
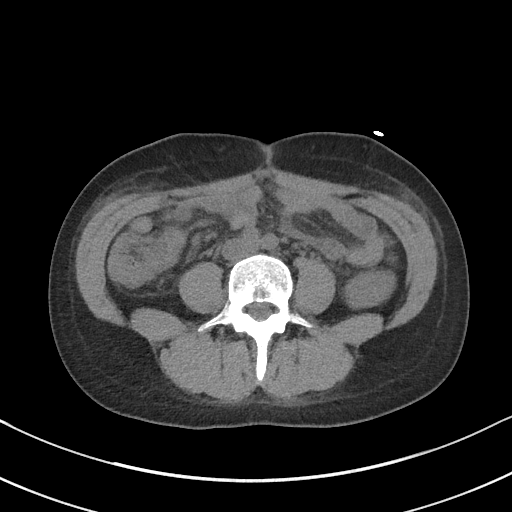
[im 58/99  soft-tissue]
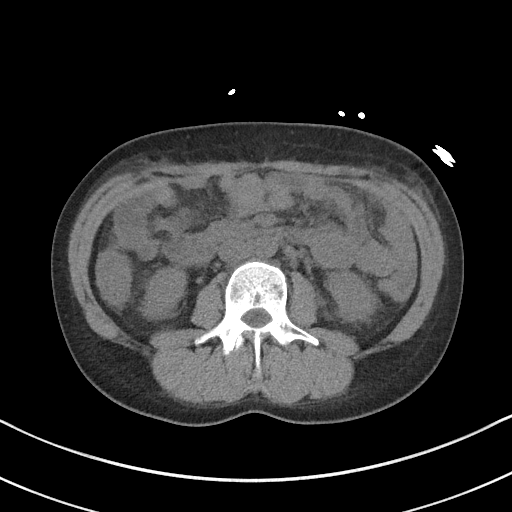
[im 66/99  soft-tissue]
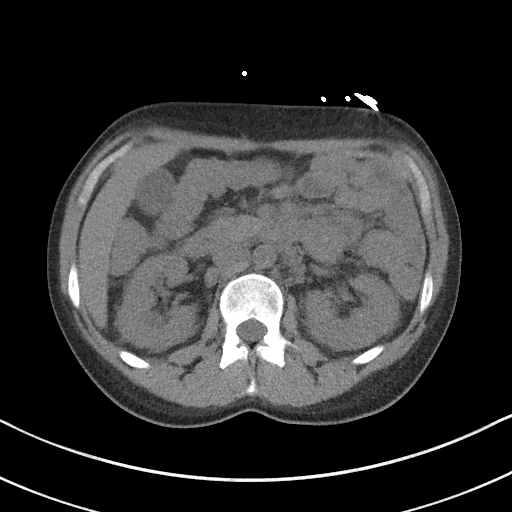
[im 66/99  bone]
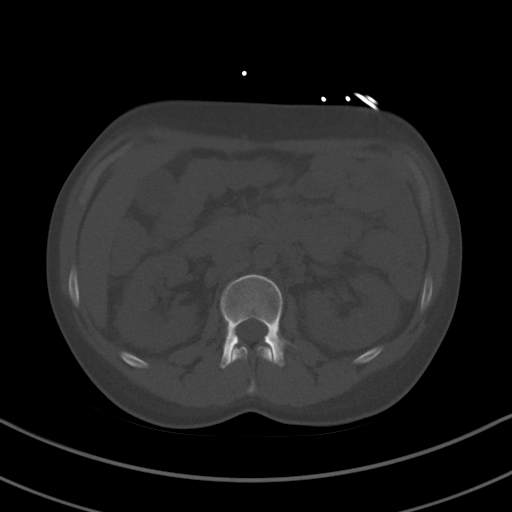
[im 70/99  soft-tissue]
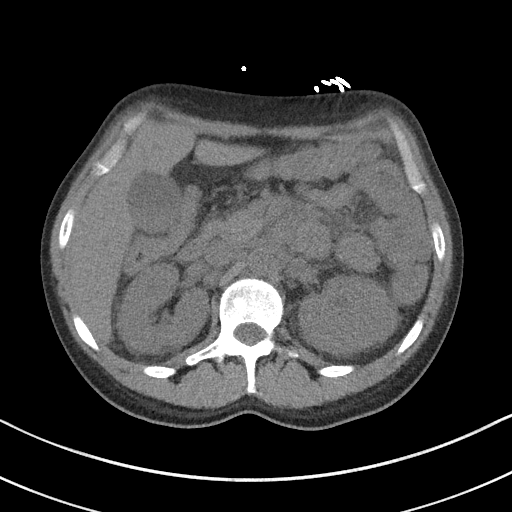
[im 78/99  soft-tissue]
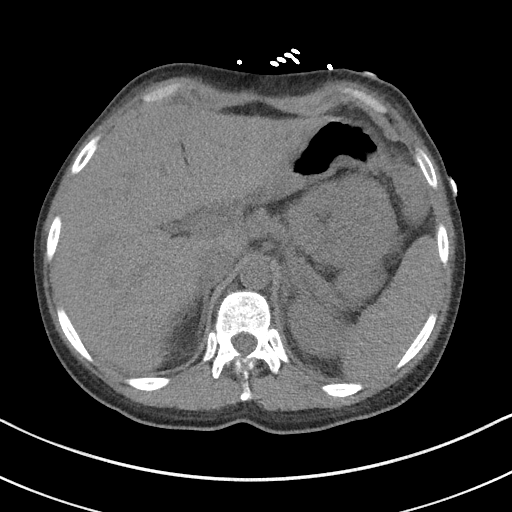
[im 86/99  soft-tissue]
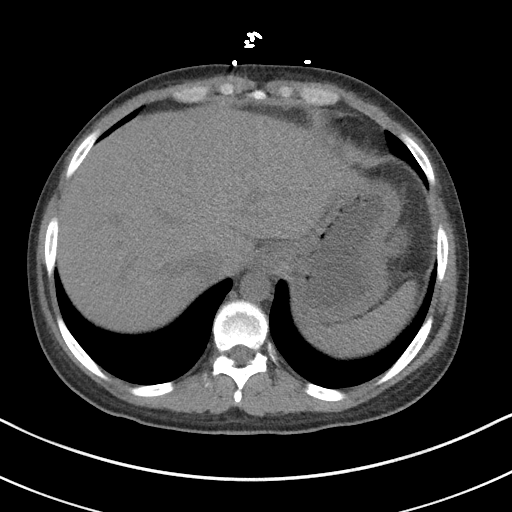
[im 94/99  soft-tissue]
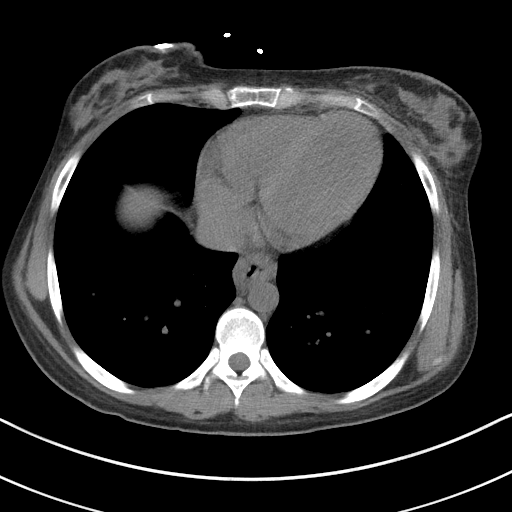

[Series 5: coronal st · coronal · 0.79mm/px · 3 of 91 slices shown]
[im 31/91  soft-tissue]
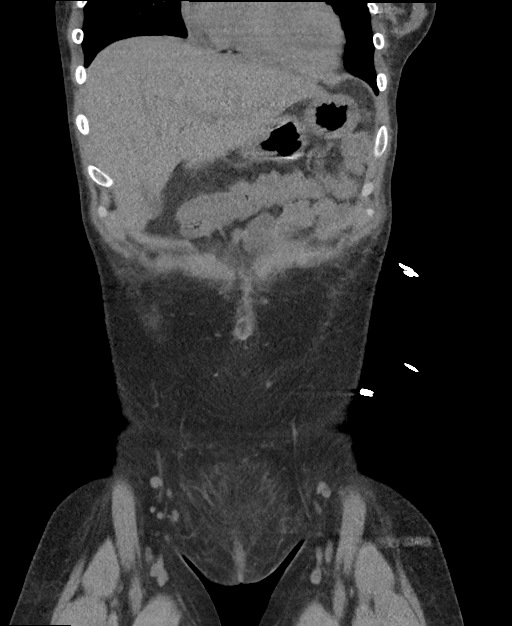
[im 41/91  soft-tissue]
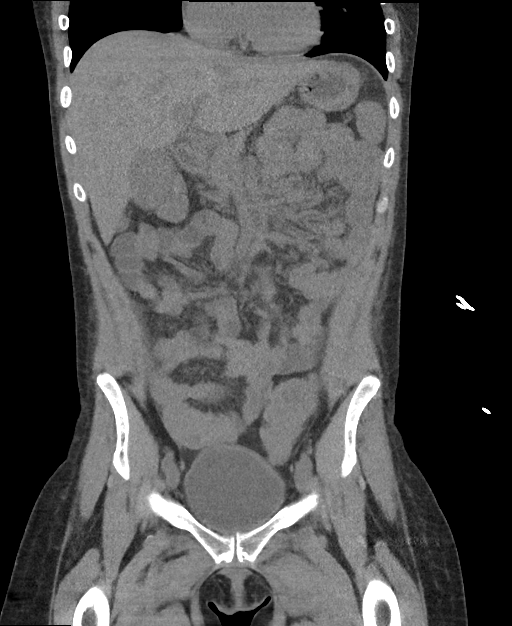
[im 51/91  soft-tissue]
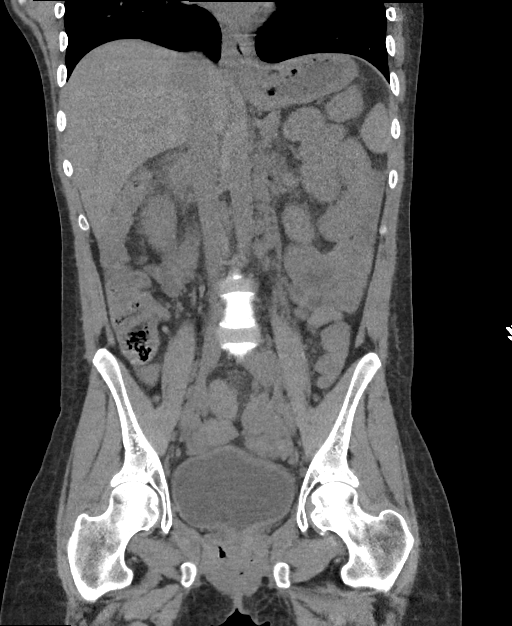

[16 of 46 positions shown; findings below may reference images not displayed]

FINDINGS: Lower chest: Unremarkable.

Hepatobiliary: No focal abnormality is seen in the liver. There is
no dilation of bile ducts. Gallbladder is slightly distended. There
is no wall thickening. There is increased density in the dependent
portion of gallbladder lumen.

Pancreas: There is atrophy.  No focal abnormality is seen.

Spleen: Unremarkable.

Adrenals/Urinary Tract: Adrenals are unremarkable. There is no
hydronephrosis. There is duplication of collecting system in the
left kidney. There are no renal or ureteral stones. Urinary bladder
is unremarkable.

Stomach/Bowel: Small hiatal hernia is seen. There is wall thickening
in the lower thoracic esophagus possibly suggesting reflux
esophagitis. There is mild diffuse wall thickening in the small
bowel loops. There is minimal stranding adjacent to the small bowel
loops. Appendix is unremarkable. There is mild diffuse wall
thickening in the colon. This may be due to incomplete distention or
suggest nonspecific inflammation.

Vascular/Lymphatic: Unremarkable.

Reproductive: Unremarkable.

Other: There is minimal ascites. There is no pneumoperitoneum. Small
umbilical hernia containing fat is seen.

Musculoskeletal: Unremarkable.
IMPRESSION: There is no evidence of intestinal obstruction or pneumoperitoneum.
There is no hydronephrosis. Appendix is not dilated.

There is mild diffuse wall thickening in the small bowel loops and
colon. This may be due to incomplete distention or suggest
nonspecific enterocolitis. There is minimal stranding in the fat
planes adjacent to small bowel loops. There is minimal ascites.

Subtle increased density is seen in the dependent portion of
gallbladder lumen suggesting presence of sludge and possibly tiny
stones. There are no signs of acute cholecystitis. There is no
dilation of bile ducts.

## 2023-03-30 IMAGING — DX DG CHEST 1V
1 series · 1 of 1 positions shown · non-contrast
Comparison: 08/07/2018

CLINICAL DATA: Reason for exam: Kidney damage

EXAM:
CHEST  1 VIEW

[chest ap]
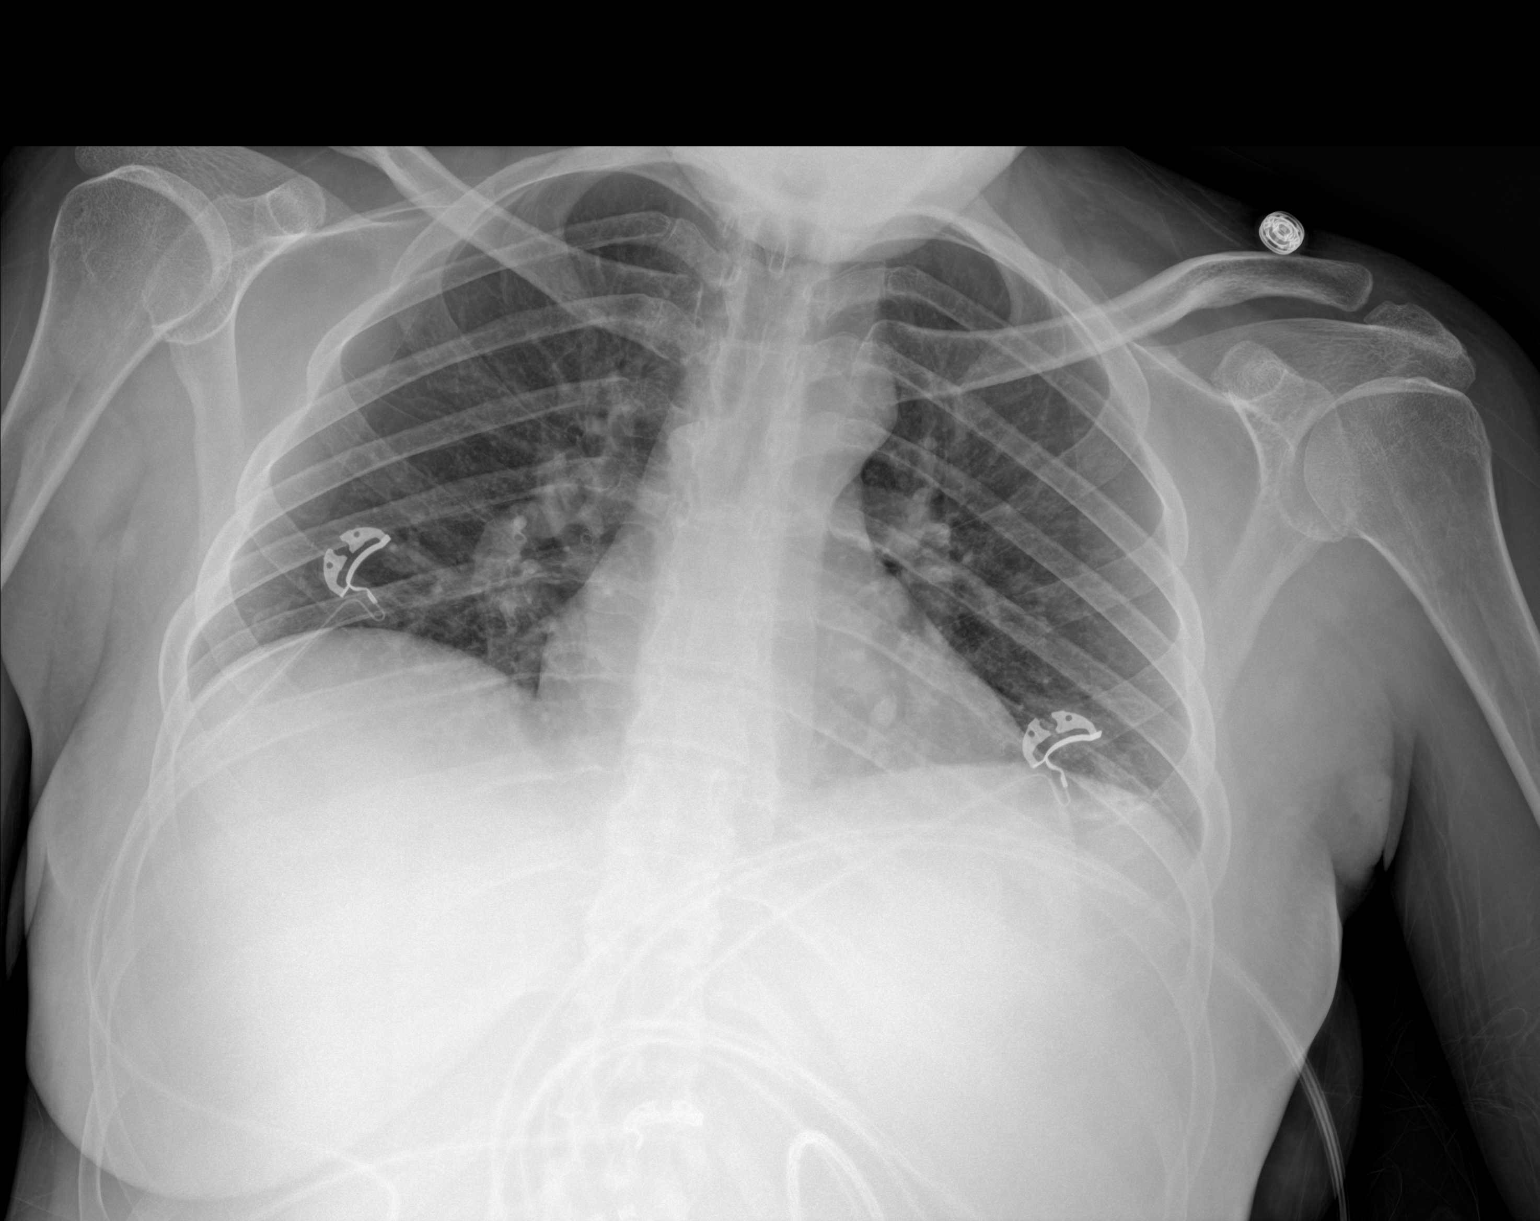

[1 of 1 positions shown; findings below may reference images not displayed]

FINDINGS: Low lung volumes with crowding of bronchovascular structures. No
focal infiltrate or overt edema.

Heart size and mediastinal contours are within normal limits.

No effusion.

Visualized bones unremarkable.
IMPRESSION: Low lung volumes.  No acute findings.

## 2023-04-07 ENCOUNTER — Observation Stay: Payer: Commercial Managed Care - PPO

## 2023-04-07 ENCOUNTER — Other Ambulatory Visit: Payer: Self-pay

## 2023-04-07 ENCOUNTER — Observation Stay
Admission: EM | Admit: 2023-04-07 | Discharge: 2023-04-08 | Disposition: A | Discharge status: Home / Self Care | Payer: Commercial Managed Care - PPO | Attending: Internal Medicine | Admitting: Internal Medicine

## 2023-04-07 ENCOUNTER — Encounter: Payer: Self-pay | Admitting: Family Medicine

## 2023-04-07 DIAGNOSIS — M549 Dorsalgia, unspecified: Secondary | ICD-10-CM | POA: Diagnosis not present

## 2023-04-07 DIAGNOSIS — I5189 Other ill-defined heart diseases: Secondary | ICD-10-CM

## 2023-04-07 DIAGNOSIS — E1010 Type 1 diabetes mellitus with ketoacidosis without coma: Secondary | ICD-10-CM | POA: Diagnosis not present

## 2023-04-07 DIAGNOSIS — Z992 Dependence on renal dialysis: Secondary | ICD-10-CM

## 2023-04-07 DIAGNOSIS — Z9641 Presence of insulin pump (external) (internal): Secondary | ICD-10-CM | POA: Diagnosis not present

## 2023-04-07 DIAGNOSIS — Z79899 Other long term (current) drug therapy: Secondary | ICD-10-CM | POA: Diagnosis not present

## 2023-04-07 DIAGNOSIS — I120 Hypertensive chronic kidney disease with stage 5 chronic kidney disease or end stage renal disease: Secondary | ICD-10-CM | POA: Insufficient documentation

## 2023-04-07 DIAGNOSIS — N186 End stage renal disease: Secondary | ICD-10-CM | POA: Diagnosis not present

## 2023-04-07 DIAGNOSIS — R739 Hyperglycemia, unspecified: Secondary | ICD-10-CM | POA: Diagnosis present

## 2023-04-07 DIAGNOSIS — N185 Chronic kidney disease, stage 5: Secondary | ICD-10-CM | POA: Diagnosis not present

## 2023-04-07 DIAGNOSIS — E1022 Type 1 diabetes mellitus with diabetic chronic kidney disease: Secondary | ICD-10-CM | POA: Diagnosis not present

## 2023-04-07 DIAGNOSIS — Z7722 Contact with and (suspected) exposure to environmental tobacco smoke (acute) (chronic): Secondary | ICD-10-CM | POA: Insufficient documentation

## 2023-04-07 DIAGNOSIS — E1065 Type 1 diabetes mellitus with hyperglycemia: Principal | ICD-10-CM | POA: Insufficient documentation

## 2023-04-07 DIAGNOSIS — D638 Anemia in other chronic diseases classified elsewhere: Secondary | ICD-10-CM

## 2023-04-07 DIAGNOSIS — I10 Essential (primary) hypertension: Secondary | ICD-10-CM | POA: Diagnosis not present

## 2023-04-07 DIAGNOSIS — E1069 Type 1 diabetes mellitus with other specified complication: Secondary | ICD-10-CM | POA: Diagnosis not present

## 2023-04-07 LAB — CBC
HCT: 34.8 % — ABNORMAL LOW (ref 36.0–46.0)
Hemoglobin: 11.9 g/dL — ABNORMAL LOW (ref 12.0–15.0)
MCH: 30.1 pg (ref 26.0–34.0)
MCHC: 34.2 g/dL (ref 30.0–36.0)
MCV: 87.9 fL (ref 80.0–100.0)
Platelets: 309 10*3/uL (ref 150–400)
RBC: 3.96 MIL/uL (ref 3.87–5.11)
RDW: 12 % (ref 11.5–15.5)
WBC: 6.4 10*3/uL (ref 4.0–10.5)
nRBC: 0 % (ref 0.0–0.2)

## 2023-04-07 LAB — BASIC METABOLIC PANEL
Anion gap: 18 — ABNORMAL HIGH (ref 5–15)
Anion gap: 17 — ABNORMAL HIGH (ref 5–15)
Anion gap: 12 (ref 5–15)
Anion gap: 14 (ref 5–15)
BUN: 46 mg/dL — ABNORMAL HIGH (ref 6–20)
BUN: 44 mg/dL — ABNORMAL HIGH (ref 6–20)
BUN: 45 mg/dL — ABNORMAL HIGH (ref 6–20)
BUN: 41 mg/dL — ABNORMAL HIGH (ref 6–20)
CO2: 21 mmol/L — ABNORMAL LOW (ref 22–32)
CO2: 20 mmol/L — ABNORMAL LOW (ref 22–32)
CO2: 23 mmol/L (ref 22–32)
CO2: 22 mmol/L (ref 22–32)
Calcium: 8.6 mg/dL — ABNORMAL LOW (ref 8.9–10.3)
Calcium: 8.4 mg/dL — ABNORMAL LOW (ref 8.9–10.3)
Calcium: 8.2 mg/dL — ABNORMAL LOW (ref 8.9–10.3)
Calcium: 8.4 mg/dL — ABNORMAL LOW (ref 8.9–10.3)
Chloride: 91 mmol/L — ABNORMAL LOW (ref 98–111)
Chloride: 93 mmol/L — ABNORMAL LOW (ref 98–111)
Chloride: 99 mmol/L (ref 98–111)
Chloride: 99 mmol/L (ref 98–111)
Creatinine, Ser: 7.12 mg/dL — ABNORMAL HIGH (ref 0.44–1.00)
Creatinine, Ser: 6.93 mg/dL — ABNORMAL HIGH (ref 0.44–1.00)
Creatinine, Ser: 7.15 mg/dL — ABNORMAL HIGH (ref 0.44–1.00)
Creatinine, Ser: 7.13 mg/dL — ABNORMAL HIGH (ref 0.44–1.00)
GFR, Estimated: 7 mL/min — ABNORMAL LOW (ref >60)
GFR, Estimated: 8 mL/min — ABNORMAL LOW (ref >60)
GFR, Estimated: 7 mL/min — ABNORMAL LOW (ref >60)
GFR, Estimated: 7 mL/min — ABNORMAL LOW (ref >60)
Glucose, Bld: 538 mg/dL (ref 70–99)
Glucose, Bld: 480 mg/dL — ABNORMAL HIGH (ref 70–99)
Glucose, Bld: 222 mg/dL — ABNORMAL HIGH (ref 70–99)
Glucose, Bld: 216 mg/dL — ABNORMAL HIGH (ref 70–99)
Potassium: 3.9 mmol/L (ref 3.5–5.1)
Potassium: 3.8 mmol/L (ref 3.5–5.1)
Potassium: 3.8 mmol/L (ref 3.5–5.1)
Potassium: 3.4 mmol/L — ABNORMAL LOW (ref 3.5–5.1)
Sodium: 130 mmol/L — ABNORMAL LOW (ref 135–145)
Sodium: 130 mmol/L — ABNORMAL LOW (ref 135–145)
Sodium: 134 mmol/L — ABNORMAL LOW (ref 135–145)
Sodium: 135 mmol/L (ref 135–145)

## 2023-04-07 LAB — CBG MONITORING, ED
Glucose-Capillary: 476 mg/dL — ABNORMAL HIGH (ref 70–99)
Glucose-Capillary: 472 mg/dL — ABNORMAL HIGH (ref 70–99)
Glucose-Capillary: 467 mg/dL — ABNORMAL HIGH (ref 70–99)
Glucose-Capillary: 408 mg/dL — ABNORMAL HIGH (ref 70–99)
Glucose-Capillary: 257 mg/dL — ABNORMAL HIGH (ref 70–99)
Glucose-Capillary: 185 mg/dL — ABNORMAL HIGH (ref 70–99)
Glucose-Capillary: 206 mg/dL — ABNORMAL HIGH (ref 70–99)
Glucose-Capillary: 208 mg/dL — ABNORMAL HIGH (ref 70–99)
Glucose-Capillary: 151 mg/dL — ABNORMAL HIGH (ref 70–99)
Glucose-Capillary: 164 mg/dL — ABNORMAL HIGH (ref 70–99)
Glucose-Capillary: 144 mg/dL — ABNORMAL HIGH (ref 70–99)

## 2023-04-07 LAB — BLOOD GAS, VENOUS
Acid-base deficit: 1.1 mmol/L (ref 0.0–2.0)
Bicarbonate: 25.3 mmol/L (ref 20.0–28.0)
O2 Saturation: 56.5 %
Patient temperature: 37
pCO2, Ven: 48 mmHg (ref 44–60)
pH, Ven: 7.33 (ref 7.25–7.43)
pO2, Ven: 37 mmHg (ref 32–45)

## 2023-04-07 LAB — URINALYSIS, ROUTINE W REFLEX MICROSCOPIC
Bacteria, UA: NONE SEEN
Bilirubin Urine: NEGATIVE
Glucose, UA: >=500 mg/dL — AB
Hgb urine dipstick: NEGATIVE
Ketones, ur: 20 mg/dL — AB
Nitrite: NEGATIVE
Protein, ur: >=300 mg/dL — AB
Specific Gravity, Urine: 1.014 (ref 1.005–1.030)
pH: 7 (ref 5.0–8.0)

## 2023-04-07 LAB — BETA-HYDROXYBUTYRIC ACID: Beta-Hydroxybutyric Acid: 2.66 mmol/L — ABNORMAL HIGH (ref 0.05–0.27)

## 2023-04-07 LAB — PREGNANCY, URINE: Preg Test, Ur: NEGATIVE

## 2023-04-07 MED ORDER — LOSARTAN POTASSIUM 50 MG PO TABS
100.0000 mg | ORAL_TABLET | Freq: Every day | ORAL | Status: DC
  Administered 2023-04-07 – 2023-04-08 (×2): 100 mg via ORAL
  Filled 2023-04-07 (×2): qty 2

## 2023-04-07 MED ORDER — DEXTROSE IN LACTATED RINGERS 5 % IV SOLN: INTRAVENOUS | Status: DC

## 2023-04-07 MED ORDER — SUMATRIPTAN SUCCINATE 50 MG PO TABS
50.0000 mg | ORAL_TABLET | ORAL | Status: DC | PRN
  Administered 2023-04-07: 50 mg via ORAL
  Filled 2023-04-07 (×2): qty 1

## 2023-04-07 MED ORDER — DEXTROSE 50 % IV SOLN: 0.0000 mL | INTRAVENOUS | Status: DC | PRN

## 2023-04-07 MED ORDER — VITAMIN D 25 MCG (1000 UNIT) PO TABS
1000.0000 [IU] | ORAL_TABLET | Freq: Every day | ORAL | Status: DC
  Administered 2023-04-07 – 2023-04-08 (×2): 1000 [IU] via ORAL
  Filled 2023-04-07 (×2): qty 1

## 2023-04-07 MED ORDER — MONTELUKAST SODIUM 10 MG PO TABS
10.0000 mg | ORAL_TABLET | Freq: Every day | ORAL | Status: DC
  Administered 2023-04-07 – 2023-04-08 (×2): 10 mg via ORAL
  Filled 2023-04-07 (×2): qty 1

## 2023-04-07 MED ORDER — INSULIN REGULAR(HUMAN) IN NACL 100-0.9 UT/100ML-% IV SOLN
INTRAVENOUS | Status: DC
  Administered 2023-04-07: 2.4 [IU]/h via INTRAVENOUS

## 2023-04-07 MED ORDER — ATORVASTATIN CALCIUM 20 MG PO TABS
80.0000 mg | ORAL_TABLET | Freq: Every day | ORAL | Status: DC
  Administered 2023-04-07 – 2023-04-08 (×2): 80 mg via ORAL
  Filled 2023-04-07 (×2): qty 4

## 2023-04-07 MED ORDER — FUROSEMIDE 10 MG/ML IJ SOLN
80.0000 mg | Freq: Once | INTRAMUSCULAR | Status: DC
  Filled 2023-04-07: qty 8

## 2023-04-07 MED ORDER — ACETAMINOPHEN 500 MG PO TABS: 1000.0000 mg | ORAL_TABLET | Freq: Four times a day (QID) | ORAL | Status: DC | PRN

## 2023-04-07 MED ORDER — ONDANSETRON HCL 4 MG/2ML IJ SOLN
4.0000 mg | Freq: Four times a day (QID) | INTRAMUSCULAR | Status: DC | PRN
  Administered 2023-04-07 – 2023-04-08 (×3): 4 mg via INTRAVENOUS
  Filled 2023-04-07 (×3): qty 2

## 2023-04-07 MED ORDER — INSULIN REGULAR(HUMAN) IN NACL 100-0.9 UT/100ML-% IV SOLN
INTRAVENOUS | Status: DC
  Administered 2023-04-07: 6 [IU]/h via INTRAVENOUS
  Filled 2023-04-07: qty 100

## 2023-04-07 MED ORDER — SODIUM CHLORIDE 0.9 % IV BOLUS
500.0000 mL | Freq: Once | INTRAVENOUS | Status: AC
  Administered 2023-04-07: 500 mL via INTRAVENOUS

## 2023-04-07 MED ORDER — OXYCODONE HCL 5 MG PO TABS
5.0000 mg | ORAL_TABLET | ORAL | Status: DC | PRN
  Administered 2023-04-07 – 2023-04-08 (×3): 5 mg via ORAL
  Filled 2023-04-07 (×3): qty 1

## 2023-04-07 MED ORDER — POTASSIUM CHLORIDE 10 MEQ/100ML IV SOLN: 10.0000 meq | INTRAVENOUS | Status: DC

## 2023-04-07 MED ORDER — CHLORHEXIDINE GLUCONATE CLOTH 2 % EX PADS: 6.0000 | MEDICATED_PAD | Freq: Every day | CUTANEOUS | Status: DC

## 2023-04-07 MED ORDER — FUROSEMIDE 20 MG PO TABS
80.0000 mg | ORAL_TABLET | Freq: Every day | ORAL | Status: DC
  Administered 2023-04-08: 80 mg via ORAL
  Filled 2023-04-07: qty 4

## 2023-04-07 MED ORDER — LACTATED RINGERS IV SOLN: INTRAVENOUS | Status: DC

## 2023-04-07 MED ORDER — HEPARIN SODIUM (PORCINE) 5000 UNIT/ML IJ SOLN
5000.0000 [IU] | Freq: Three times a day (TID) | INTRAMUSCULAR | Status: DC
  Filled 2023-04-07: qty 1

## 2023-04-07 MED ORDER — LACTATED RINGERS IV BOLUS
500.0000 mL | Freq: Once | INTRAVENOUS | Status: AC
  Administered 2023-04-07: 500 mL via INTRAVENOUS

## 2023-04-07 MED ORDER — POTASSIUM CHLORIDE 10 MEQ/100ML IV SOLN
10.0000 meq | INTRAVENOUS | Status: AC
  Administered 2023-04-07: 10 meq via INTRAVENOUS
  Filled 2023-04-07 (×2): qty 100

## 2023-04-07 MED ORDER — AMLODIPINE BESYLATE 10 MG PO TABS
10.0000 mg | ORAL_TABLET | Freq: Every day | ORAL | Status: DC
  Administered 2023-04-07 – 2023-04-08 (×2): 10 mg via ORAL
  Filled 2023-04-07: qty 1
  Filled 2023-04-07: qty 2

## 2023-04-07 NOTE — ED Provider Notes (Signed)
[facility] Provider Note    Event Date/Time   First MD Initiated Contact with Patient 04/07/23 1241     (approximate)   History   Hyperglycemia   HPI  Emma Thomas is a 27 y.o. female   Past medical history of 1 diabetes, kidney failure on dialysis and hypertension who presents to the emergency department with high blood sugars.  She has been having difficulty controlling her blood sugars on her insulin pump and has been having some nausea/vomiting and generalized weakness as well.  She denies any focal infectious symptoms like respiratory infectious symptoms or GU symptoms.  She denies any pain, fever, chills.  Still makes urine.   External Medical Documents Reviewed: Endocrinology telemedicine visit yesterday for high blood sugars, with a plan to move her insulin pump site if sugars continue to run high.      Physical Exam   Triage Vital Signs: ED Triage Vitals [04/07/23 0904]  Enc Vitals Group     BP (!) 145/96     Pulse Rate (!) 101     Resp 18     Temp 97.7 F (36.5 C)     Temp src      SpO2 100 %     Weight 149 lb 14.6 oz (68 kg)     Height 5\' 6"  (1.676 m)     Head Circumference      Peak Flow      Pain Score 5     Pain Loc      Pain Edu?      Excl. in GC?     Most recent vital signs: Vitals:   04/07/23 0904  BP: (!) 145/96  Pulse: (!) 101  Resp: 18  Temp: 97.7 F (36.5 C)  SpO2: 100%    General: Awake, no distress.  CV:  Good peripheral perfusion.  Resp:  Normal effort.  Abd:  No distention.  Other:  Awake alert well-appearing mild tachycardia no fever.  Soft nontender abdomen.  Lungs clear.   ED Results / Procedures / Treatments   Labs (all labs ordered are listed, but only abnormal results are displayed) Labs Reviewed  BASIC METABOLIC PANEL - Abnormal; Notable for the following components:      Result Value   Sodium 130 (*)    Chloride 91 (*)    CO2 21 (*)    Glucose, Bld 538 (*)    BUN 46 (*)     Creatinine, Ser 7.12 (*)    Calcium 8.6 (*)    GFR, Estimated 7 (*)    Anion gap 18 (*)    All other components within normal limits  CBC - Abnormal; Notable for the following components:   Hemoglobin 11.9 (*)    HCT 34.8 (*)    All other components within normal limits  URINALYSIS, ROUTINE W REFLEX MICROSCOPIC - Abnormal; Notable for the following components:   Color, Urine YELLOW (*)    APPearance HAZY (*)    Glucose, UA >=500 (*)    Ketones, ur 20 (*)    Protein, ur >=300 (*)    Leukocytes,Ua TRACE (*)    All other components within normal limits  BASIC METABOLIC PANEL - Abnormal; Notable for the following components:   Sodium 130 (*)    Chloride 93 (*)    CO2 20 (*)    Glucose, Bld 480 (*)    BUN 44 (*)    Creatinine, Ser 6.93 (*)    Calcium 8.4 (*)  GFR, Estimated 8 (*)    Anion gap 17 (*)    All other components within normal limits  CBG MONITORING, ED - Abnormal; Notable for the following components:   Glucose-Capillary 476 (*)    All other components within normal limits  CBG MONITORING, ED - Abnormal; Notable for the following components:   Glucose-Capillary 472 (*)    All other components within normal limits  CBG MONITORING, ED - Abnormal; Notable for the following components:   Glucose-Capillary 467 (*)    All other components within normal limits  BLOOD GAS, VENOUS  BETA-HYDROXYBUTYRIC ACID  BASIC METABOLIC PANEL  BASIC METABOLIC PANEL  BASIC METABOLIC PANEL  BLOOD GAS, VENOUS  PREGNANCY, URINE     I ordered and reviewed the above labs they are notable for hyperglycemia and elevated anion gap    PROCEDURES:  Critical Care performed: Yes, see critical care procedure note(s)  .Critical Care  Performed by: Pilar Jarvis, MD Authorized by: Pilar Jarvis, MD   Critical care provider statement:    Critical care time (minutes):  30   Critical care was time spent personally by me on the following activities:  Development of treatment plan with patient  or surrogate, discussions with consultants, evaluation of patient's response to treatment, examination of patient, ordering and review of laboratory studies, ordering and review of radiographic studies, ordering and performing treatments and interventions, pulse oximetry, re-evaluation of patient's condition and review of old charts    MEDICATIONS ORDERED IN ED: Medications  insulin regular, human (MYXREDLIN) 100 units/ 100 mL infusion (6 Units/hr Intravenous New Bag/Given 04/07/23 1315)  lactated ringers infusion (has no administration in time range)  dextrose 5 % in lactated ringers infusion (has no administration in time range)  dextrose 50 % solution 0-50 mL (has no administration in time range)  potassium chloride 10 mEq in 100 mL IVPB (has no administration in time range)  lactated ringers bolus 500 mL (has no administration in time range)  sodium chloride 0.9 % bolus 500 mL (0 mLs Intravenous Stopped 04/07/23 1201)  sodium chloride 0.9 % bolus 500 mL (0 mLs Intravenous Stopped 04/07/23 1244)    External physician / consultants:  I spoke with hospitalist for admission and regarding care plan for this patient.   IMPRESSION / MDM / ASSESSMENT AND PLAN / ED COURSE  I reviewed the triage vital signs and the nursing notes.                                Patient's presentation is most consistent with acute presentation with potential threat to life or bodily function.  Differential diagnosis includes, but is not limited to, DKA, hyperglycemia, infection, metabolic derangement/dehydration, insulin pump malfunction   The patient is on the cardiac monitor to evaluate for evidence of arrhythmia and/or significant heart rate changes.  MDM: This is a patient with hyperglycemia and anion gap and consistent with DKA.  She has had generalized weakness and nausea and vomiting and perhaps due to malfunctioning insulin pump, has been speaking with endocrinology with a plan to move the site if she  continues to be hyperglycemic.  Started on DKA protocol.  End-stage renal disease on dialysis but makes urine does not look fluid overloaded, will carefully fluid resuscitate as well.  No focal infectious symptoms and no fever, no elevation white blood cell count.      FINAL CLINICAL IMPRESSION(S) / ED DIAGNOSES   Final diagnoses:  Diabetic ketoacidosis without coma associated with type 1 diabetes mellitus (HCC)     Rx / DC Orders   ED Discharge Orders     None        Note:  This document was prepared using Dragon voice recognition software and may include unintentional dictation errors.    Pilar Jarvis, MD 04/07/23 [id number]

## 2023-04-07 NOTE — Assessment & Plan Note (Signed)
Hemoglobin 12 today with a baseline hemoglobin around 8-10 Follow

## 2023-04-07 NOTE — Assessment & Plan Note (Signed)
Blood sugars 400s to 500s without overt ketoacidosis in the setting of baseline type 1 diabetes Will start on insulin drip Monitor volume status in the setting of baseline ESRD on daily hemodialysis at home Still making urine Some concern for malfunctioning insulin pump Will need reassessment at discharge Follow

## 2023-04-07 NOTE — Assessment & Plan Note (Signed)
2D echo September 2023 with EF 55 to 60% and grade 1 diastolic dysfunction Mildly dry on exam Getting gentle IV fluid hydration in the setting of hypoglycemia protocol Monitor volume status closely with concomitant ESRD Follow closely

## 2023-04-07 NOTE — H&P (Signed)
History and Physical    Patient: Emma Thomas RUE:[id number] DOB: 01-14-1996 DOA: 04/07/2023 DOS: the patient was seen and examined on 04/07/2023 PCP: Dan Humphreys, MD  Patient coming from: Home  Chief Complaint:  Chief Complaint  Patient presents with   Hyperglycemia   HPI: Emma Thomas is a 27 y.o. female with medical history significant of type 1 diabetes with insulin pump in place, end-stage renal disease on hemodialysis at home daily, hypertension, anemia of chronic disease, headache presenting with hyperglycemia.  Patient reports having elevated blood sugars over the past 1 to 2 days.  Blood sugars in the 4-5 100s at home.  Noted baseline insulin pump.  Patient unclear if the pump has not been working.  No reports of change in diet.  No fevers or chills.  Positive worsening nausea vomiting decreased p.o. intake.  Denies any recent infections.  No chest pain or shortness of breath.  Mild abdominal pain.  No dysuria.  Noted low back pain which is an acute on chronic issue.  No reported heavy lifting.  No reported trauma.  Presented to the ER afebrile, BP relatively stable.  White count 6.4, hemoglobin 9.9, platelets 309, blood sugars in the 3-5 100s.  VBG stable.  Urinalysis with trace ketones.  Beta hydroxybutyrate of 2.7.  Creatinine 6.93.  Bicarb of 20.  Sodium 130.  Started on  insulin drip in the ER. Review of Systems: As mentioned in the history of present illness. All other systems reviewed and are negative. Past Medical History:  Diagnosis Date   Anemia    Diabetes mellitus without complication (HCC)    Type 1 DM   Essential hypertension    Headache    Hypertension 03/04/2013   Neurologic disorder    Both feet   Neuromuscular disorder (HCC)    Recurrent UTI    Renal disorder    Past Surgical History:  Procedure Laterality Date   abscess removal     excision of bartholin cyst   AMPUTATION Left 11/23/2022   Procedure: LEFT GREAT TOE PARTIAL RAY AMPUTATION;   Surgeon: Linus Galas, DPM;  Location: [facility];  Service: Podiatry;  Laterality: Left;   CAPD INSERTION N/A 06/09/2022   Procedure: LAPAROSCOPIC INSERTION CONTINUOUS AMBULATORY PERITONEAL DIALYSIS  (CAPD) CATHETER, PD rep to be present;  Surgeon: Henrene Dodge, MD;  Location: [facility];  Service: General;  Laterality: N/A;   CAPD INSERTION N/A 12/15/2022   Procedure: LAPAROSCOPIC INSERTION CONTINUOUS AMBULATORY PERITONEAL DIALYSIS  (CAPD) CATHETER;  Surgeon: Henrene Dodge, MD;  Location: [facility];  Service: General;  Laterality: N/A;   CAPD REMOVAL N/A 12/15/2022   Procedure: LAPAROSCOPIC REMOVAL CONTINUOUS AMBULATORY PERITONEAL DIALYSIS  (CAPD) CATHETER, removal of old catheter;  Surgeon: Henrene Dodge, MD;  Location: [facility];  Service: General;  Laterality: N/A;   CAPD REMOVAL N/A 01/08/2023   Procedure: LAPAROSCOPIC REMOVAL CONTINUOUS AMBULATORY PERITONEAL DIALYSIS  (CAPD) CATHETER;  Surgeon: Henrene Dodge, MD;  Location: [facility];  Service: General;  Laterality: N/A;   CAPD REVISION N/A 07/16/2022   Procedure: LAPAROSCOPIC REVISION CONTINUOUS AMBULATORY PERITONEAL DIALYSIS  (CAPD) CATHETER;  Surgeon: Henrene Dodge, MD;  Location: [facility];  Service: General;  Laterality: N/A;   CAPD REVISION N/A 11/24/2022   Procedure: LAPAROSCOPIC REVISION CONTINUOUS AMBULATORY PERITONEAL DIALYSIS  (CAPD) CATHETER;  Surgeon: Henrene Dodge, MD;  Location: [facility];  Service: General;  Laterality: N/A;   DIALYSIS/PERMA CATHETER INSERTION N/A 04/24/2022   Procedure: DIALYSIS/PERMA CATHETER INSERTION;  Surgeon: Renford Dills, MD;  Location:  [facility];  Service: Cardiovascular;  Laterality: N/A;   DIALYSIS/PERMA CATHETER REMOVAL N/A 06/02/2022   Procedure: DIALYSIS/PERMA CATHETER REMOVAL;  Surgeon: Renford Dills, MD;  Location: [facility];  Service: Cardiovascular;  Laterality: N/A;   DIALYSIS/PERMA CATHETER REMOVAL N/A 12/28/2022   Procedure: DIALYSIS/PERMA CATHETER REMOVAL;  Surgeon: Annice Needy, MD;  Location: [facility];  Service: Cardiovascular;  Laterality: N/A;   INSERTION OF DIALYSIS CATHETER Right    ARM   IR FLUORO GUIDE CV LINE RIGHT  11/27/2022   IR US GUIDE VASC ACCESS RIGHT  11/27/2022   Nexplanon  01/2011   UMBILICAL HERNIA REPAIR N/A 06/09/2022   Procedure: HERNIA REPAIR UMBILICAL ADULT;  Surgeon: Henrene Dodge, MD;  Location: [facility];  Service: General;  Laterality: N/A;   Social History:  reports that she has never smoked. She has been exposed to tobacco smoke. She has never used smokeless tobacco. She reports that she does not currently use alcohol after a past usage of about 2.0 standard drinks of alcohol per week. She reports that she does not use drugs.  No Known Allergies  Family History  Problem Relation Age of Onset   Breast cancer Mother 3   Lung cancer Maternal Grandmother    Diabetes type II Paternal Grandmother     Prior to Admission medications   Medication Sig Start Date End Date Taking? Authorizing Provider  acetaminophen (TYLENOL) 500 MG tablet Take 2 tablets (1,000 mg total) by mouth every 6 (six) hours as needed for mild pain. 07/16/22   Henrene Dodge, MD  acetaminophen (TYLENOL) 500 MG tablet Take 2 tablets (1,000 mg total) by mouth every 6 (six) hours as needed for mild pain. 01/08/23   Henrene Dodge, MD  amLODipine (NORVASC) 10 MG tablet Take 1 tablet (10 mg total) by mouth daily. 04/28/22   Lurene Shadow, MD  atorvastatin (LIPITOR) 40 MG tablet Take 80 mg by mouth daily. 01/30/22   [provider]  bumetanide (BUMEX) 1 MG tablet Take 1 mg by mouth daily. 07/13/22   [provider]  cholecalciferol (VITAMIN D3) 25 MCG (1000 UNIT) tablet Take 1,000 Units by mouth daily.    [provider]  Continuous Blood Gluc Sensor (DEXCOM G6 SENSOR) MISC by Does not apply route.    [provider]  ferrous gluconate (FERGON) 324 MG tablet Take 324 mg by mouth daily with breakfast. 05/03/20   [provider]  furosemide (LASIX) 80 MG tablet Take 80 mg by mouth daily. 08/10/22   [provider]  HUMALOG 100 UNIT/ML injection Inject into the skin. 12/04/22   [provider]  JUNEL 1/20 1-20 MG-MCG tablet Take 1 tablet by mouth daily. 04/17/22   [provider]  losartan (COZAAR) 100 MG tablet Take 100 mg by mouth daily. 06/04/22   [provider]  montelukast (SINGULAIR) 10 MG tablet Take 10 mg by mouth daily. 04/19/22   [provider]  OVER THE COUNTER MEDICATION Tandem Insulin pump    [provider]  oxyCODONE (OXY IR/ROXICODONE) 5 MG immediate release tablet Take 1 tablet (5 mg total) by mouth every 4 (four) hours as needed for severe pain. 01/08/23   Henrene Dodge, MD  POTASSIUM CHLORIDE PO Take 1 tablet by mouth daily. [facility] brand    [provider]  SUMAtriptan (IMITREX) 50 MG tablet Take 50 mg by mouth daily as needed for migraine. 11/11/21   [provider]    Physical Exam:  Vitals:   04/07/23 0904 04/07/23 1402  BP: (!) 145/96 (!) 152/99  Pulse: (!) 101 (!) 102  Resp: 18   Temp: 97.7 F (36.5 C)   SpO2: 100% 100%  Weight: 68 kg   Height: 5\' 6"  (1.676 m)    Physical Exam Constitutional:      General: She is not in acute distress.    Appearance: She is normal weight.  HENT:     Head: Normocephalic and atraumatic.     Nose: Nose normal.     Mouth/Throat:     Mouth: Mucous membranes are dry.  Eyes:     Pupils: Pupils are equal, round, and reactive to light.  Cardiovascular:     Rate and Rhythm: Normal rate and regular rhythm.  Pulmonary:     Effort: Pulmonary effort is normal.  Abdominal:     General: Abdomen is flat.     Comments: Minimal to mild generalized abd pain   Musculoskeletal:        General: Normal range of motion.     Cervical back: Normal range of motion.  Skin:    General: Skin is dry.  Neurological:     General: No focal deficit present.  Psychiatric:        Mood and Affect:  Mood normal.     Data Reviewed:  There are no new results to review at this time. PERIPHERAL VASCULAR CATHETERIZATION See surgical note for result.  Lab Results  Component Value Date   WBC 6.4 04/07/2023   HGB 11.9 (L) 04/07/2023   HCT 34.8 (L) 04/07/2023   MCV 87.9 04/07/2023   PLT 309 04/07/2023   Last metabolic panel Lab Results  Component Value Date   GLUCOSE 480 (H) 04/07/2023   NA 130 (L) 04/07/2023   K 3.8 04/07/2023   CL 93 (L) 04/07/2023   CO2 20 (L) 04/07/2023   BUN 44 (H) 04/07/2023   CREATININE 6.93 (H) 04/07/2023   GFRNONAA 8 (L) 04/07/2023   CALCIUM 8.4 (L) 04/07/2023   PHOS 4.4 11/26/2022   PROT 6.4 (L) 11/21/2022   ALBUMIN 1.9 (L) 11/26/2022   LABGLOB 3.0 10/25/2019   AGRATIO 1.0 (L) 10/25/2019   BILITOT 0.8 11/21/2022   ALKPHOS 67 11/21/2022   AST 53 (H) 11/21/2022   ALT 50 (H) 11/21/2022   ANIONGAP 17 (H) 04/07/2023    Assessment and Plan: * Hyperglycemia Blood sugars 400s to 500s without overt ketoacidosis in the setting of baseline type 1 diabetes Will start on insulin drip Monitor volume status in the setting of baseline ESRD on daily hemodialysis at home Still making urine Some concern for malfunctioning insulin pump Will need reassessment at discharge Follow  CKD (chronic kidney disease) stage V requiring chronic dialysis (HCC) End-stage renal disease on home hemodialysis daily Still making urine Will consult nephrology in the setting of patient getting gentle IV fluid hydration associated with hyperglycemia Monitor volume status closely   Diastolic dysfunction 2D echo September 2023 with EF 55 to 60% and grade 1 diastolic dysfunction Mildly dry on exam Getting gentle IV fluid hydration in the setting of hypoglycemia protocol Monitor volume status closely with concomitant ESRD Follow closely  Anemia of chronic disease Hemoglobin 12 today with a baseline hemoglobin around 8-10 Follow  Hypertension BP stable Titrate home  regimen      Advance Care Planning:   Code Status: Full Code   Consults: Nephrology for possible HD while admitted.   Family Communication: Discussed plan of care with husband over  the phone   Severity of Illness: The appropriate patient status for this patient is OBSERVATION. Observation status is judged to be reasonable and necessary in order to provide the required intensity of service to ensure the patient's safety. The patient's presenting symptoms, physical exam findings, and initial radiographic and laboratory data in the context of their medical condition is felt to place them at decreased risk for further clinical deterioration. Furthermore, it is anticipated that the patient will be medically stable for discharge from the hospital within 2 midnights of admission.   Author: Floydene Flock, MD 04/07/2023 2:36 PM  For on call review [email or web address].

## 2023-04-07 NOTE — Assessment & Plan Note (Signed)
BP stable Titrate home regimen 

## 2023-04-07 NOTE — ED Triage Notes (Signed)
Pt to ED for hyperglycemia. Glucometer reading HI since Monday.  Pt in kidney failure, last dialysis on Monday.

## 2023-04-07 NOTE — Inpatient Diabetes Management (Signed)
Inpatient Diabetes Program Recommendations  AACE/ADA: New Consensus Statement on Inpatient Glycemic Control (2015)  Target Ranges:  Prepandial:   less than 140 mg/dL      Peak postprandial:   less than 180 mg/dL (1-2 hours)      Critically ill patients:  140 - 180 mg/dL    Latest Reference Range & Units 04/07/23 09:07  Sodium 135 - 145 mmol/L 130 (L)  Potassium 3.5 - 5.1 mmol/L 3.9  Chloride 98 - 111 mmol/L 91 (L)  CO2 22 - 32 mmol/L 21 (L)  Glucose 70 - 99 mg/dL 098 (HH)  BUN 6 - 20 mg/dL 46 (H)  Creatinine 1.19 - 1.00 mg/dL 1.47 (H)  Calcium 8.9 - 10.3 mg/dL 8.6 (L)  Anion gap 5 - 15  18 (H)  (HH): Data is critically high (L): Data is abnormally low (H): Data is abnormally high  Latest Reference Range & Units 04/07/23 09:10 04/07/23 11:53 04/07/23 13:07  Glucose-Capillary 70 - 99 mg/dL 829 (H) 562 (H) 130 (H)  IV Insulin Drip Started @ 1315  (H): Data is abnormally high   Admit with: DKA  History: Type 1 Diabetes, ESRD   Home DM Meds: Tandem Insulin Pump        Dexcom G7 CGM  Current Orders: IV Insulin Drip     MD- Please leave pt on the IV Insulin Drip until Anion Gap closer to 12 and pt has at least 4 consecutive CBGs 180 mg/dl or less   When her condition has improved and you allow her to transition back to SQ Insulin, we can either transition her back to her home insulin pump (pt will need to start new insertion site) or we can transition to Basal/Bolus using her Normal Insulin Pump settings as a guide: Semglee 12 units Daily + Novolog Sensitive Correction Scale/ SSI (0-9 units) TID AC + HS + Novolog 4 units TID with meals for meal coverage     ENDO: [facility] Seen by Telemedicine visit 04/06/2023 Pt's Insulin Pump settings were changed to "High blood Glucose settings" yesterday: Basal/ Correction/ Carb Ratio 12am  0.8--40--7 8am    0.9--35--6 4pm    1.1--30--6 9pm    0.9--35--7   Normal Pump Settings:  Basal/ Correction/ Carb Ratio 12am   0.5--65--8 3am    0.5--65--8 6am    0.6--50--8 11am   0.6--50--8 4pm     0.8--40--7 9pm     0.6--55--8      --Will follow patient during hospitalization--  Ambrose Finland RN, MSN, CDCES Diabetes Coordinator Inpatient Glycemic Control Team Team Pager: [phone] (8a-5p)

## 2023-04-07 NOTE — Assessment & Plan Note (Signed)
End-stage renal disease on home hemodialysis daily Still making urine Will consult nephrology in the setting of patient getting gentle IV fluid hydration associated with hyperglycemia Monitor volume status closely

## 2023-04-08 ENCOUNTER — Encounter: Payer: Self-pay | Admitting: Family Medicine

## 2023-04-08 DIAGNOSIS — N186 End stage renal disease: Secondary | ICD-10-CM | POA: Diagnosis not present

## 2023-04-08 DIAGNOSIS — I10 Essential (primary) hypertension: Secondary | ICD-10-CM | POA: Diagnosis not present

## 2023-04-08 DIAGNOSIS — R739 Hyperglycemia, unspecified: Secondary | ICD-10-CM | POA: Diagnosis not present

## 2023-04-08 DIAGNOSIS — D638 Anemia in other chronic diseases classified elsewhere: Secondary | ICD-10-CM | POA: Diagnosis not present

## 2023-04-08 LAB — BASIC METABOLIC PANEL
Anion gap: 11 (ref 5–15)
Anion gap: 13 (ref 5–15)
BUN: 44 mg/dL — ABNORMAL HIGH (ref 6–20)
BUN: 42 mg/dL — ABNORMAL HIGH (ref 6–20)
CO2: 23 mmol/L (ref 22–32)
CO2: 22 mmol/L (ref 22–32)
Calcium: 8.4 mg/dL — ABNORMAL LOW (ref 8.9–10.3)
Calcium: 8.5 mg/dL — ABNORMAL LOW (ref 8.9–10.3)
Chloride: 100 mmol/L (ref 98–111)
Chloride: 100 mmol/L (ref 98–111)
Creatinine, Ser: 7.4 mg/dL — ABNORMAL HIGH (ref 0.44–1.00)
Creatinine, Ser: 7.47 mg/dL — ABNORMAL HIGH (ref 0.44–1.00)
GFR, Estimated: 7 mL/min — ABNORMAL LOW (ref >60)
GFR, Estimated: 7 mL/min — ABNORMAL LOW (ref >60)
Glucose, Bld: 105 mg/dL — ABNORMAL HIGH (ref 70–99)
Glucose, Bld: 183 mg/dL — ABNORMAL HIGH (ref 70–99)
Potassium: 3.2 mmol/L — ABNORMAL LOW (ref 3.5–5.1)
Potassium: 3.3 mmol/L — ABNORMAL LOW (ref 3.5–5.1)
Sodium: 134 mmol/L — ABNORMAL LOW (ref 135–145)
Sodium: 135 mmol/L (ref 135–145)

## 2023-04-08 LAB — GLUCOSE, CAPILLARY
Glucose-Capillary: 106 mg/dL — ABNORMAL HIGH (ref 70–99)
Glucose-Capillary: 123 mg/dL — ABNORMAL HIGH (ref 70–99)
Glucose-Capillary: 127 mg/dL — ABNORMAL HIGH (ref 70–99)
Glucose-Capillary: 173 mg/dL — ABNORMAL HIGH (ref 70–99)
Glucose-Capillary: 173 mg/dL — ABNORMAL HIGH (ref 70–99)
Glucose-Capillary: 176 mg/dL — ABNORMAL HIGH (ref 70–99)
Glucose-Capillary: 180 mg/dL — ABNORMAL HIGH (ref 70–99)
Glucose-Capillary: 185 mg/dL — ABNORMAL HIGH (ref 70–99)
Glucose-Capillary: 200 mg/dL — ABNORMAL HIGH (ref 70–99)
Glucose-Capillary: 233 mg/dL — ABNORMAL HIGH (ref 70–99)

## 2023-04-08 LAB — MRSA NEXT GEN BY PCR, NASAL: MRSA by PCR Next Gen: NOT DETECTED

## 2023-04-08 LAB — HEPATITIS B SURFACE ANTIGEN: Hepatitis B Surface Ag: NONREACTIVE

## 2023-04-08 MED ORDER — PROCHLORPERAZINE EDISYLATE 10 MG/2ML IJ SOLN
10.0000 mg | Freq: Once | INTRAMUSCULAR | Status: AC
  Administered 2023-04-08: 10 mg via INTRAVENOUS
  Filled 2023-04-08: qty 2

## 2023-04-08 MED ORDER — LIDOCAINE-PRILOCAINE 2.5-2.5 % EX CREA: 1.0000 | TOPICAL_CREAM | CUTANEOUS | Status: DC | PRN

## 2023-04-08 MED ORDER — HEPARIN SODIUM (PORCINE) 1000 UNIT/ML DIALYSIS: 1000.0000 [IU] | INTRAMUSCULAR | Status: DC | PRN

## 2023-04-08 MED ORDER — HUMALOG 100 UNIT/ML IJ SOLN: 0.0000 [IU] | Freq: Every day | INTRAMUSCULAR | 11 refills | Status: AC

## 2023-04-08 MED ORDER — INSULIN ASPART 100 UNIT/ML IJ SOLN
4.0000 [IU] | Freq: Three times a day (TID) | INTRAMUSCULAR | Status: DC
  Administered 2023-04-08: 4 [IU] via SUBCUTANEOUS
  Filled 2023-04-08: qty 1

## 2023-04-08 MED ORDER — POTASSIUM CHLORIDE CRYS ER 20 MEQ PO TBCR
20.0000 meq | EXTENDED_RELEASE_TABLET | Freq: Once | ORAL | Status: AC
  Administered 2023-04-08: 20 meq via ORAL
  Filled 2023-04-08: qty 1

## 2023-04-08 MED ORDER — LIDOCAINE HCL (PF) 1 % IJ SOLN: 5.0000 mL | INTRAMUSCULAR | Status: DC | PRN

## 2023-04-08 MED ORDER — ALTEPLASE 2 MG IJ SOLR: 2.0000 mg | Freq: Once | INTRAMUSCULAR | Status: DC | PRN

## 2023-04-08 MED ORDER — PENTAFLUOROPROP-TETRAFLUOROETH EX AERO: 1.0000 | INHALATION_SPRAY | CUTANEOUS | Status: DC | PRN

## 2023-04-08 MED ORDER — INSULIN ASPART 100 UNIT/ML IJ SOLN
0.0000 [IU] | Freq: Three times a day (TID) | INTRAMUSCULAR | Status: DC
  Administered 2023-04-08: 2 [IU] via SUBCUTANEOUS
  Filled 2023-04-08: qty 1

## 2023-04-08 MED ORDER — ANTICOAGULANT SODIUM CITRATE 4% (200MG/5ML) IV SOLN: 5.0000 mL | Status: DC | PRN

## 2023-04-08 MED ORDER — POTASSIUM CHLORIDE CRYS ER 20 MEQ PO TBCR: 40.0000 meq | EXTENDED_RELEASE_TABLET | Freq: Once | ORAL | Status: DC

## 2023-04-08 MED ORDER — CHLORHEXIDINE GLUCONATE CLOTH 2 % EX PADS
6.0000 | MEDICATED_PAD | Freq: Every day | CUTANEOUS | Status: DC
  Administered 2023-04-08: 6 via TOPICAL

## 2023-04-08 MED ORDER — POTASSIUM CHLORIDE CRYS ER 10 MEQ PO TBCR: 10.0000 meq | EXTENDED_RELEASE_TABLET | Freq: Two times a day (BID) | ORAL | 0 refills | Status: DC

## 2023-04-08 MED ORDER — INSULIN GLARGINE-YFGN 100 UNIT/ML ~~LOC~~ SOLN
12.0000 [IU] | Freq: Every day | SUBCUTANEOUS | Status: DC
  Administered 2023-04-08: 12 [IU] via SUBCUTANEOUS
  Filled 2023-04-08: qty 0.12

## 2023-04-08 NOTE — Plan of Care (Signed)
  Problem: Education: Goal: Ability to describe self-care measures that may prevent or decrease complications (Diabetes Survival Skills Education) will improve Outcome: Progressing Goal: Individualized Educational Video(s) Outcome: Progressing   Problem: Coping: Goal: Ability to adjust to condition or change in health will improve Outcome: Progressing   Problem: Fluid Volume: Goal: Ability to maintain a balanced intake and output will improve Outcome: Progressing   Problem: Health Behavior/Discharge Planning: Goal: Ability to identify and utilize available resources and services will improve Outcome: Progressing Goal: Ability to manage health-related needs will improve Outcome: Progressing   Problem: Metabolic: Goal: Ability to maintain appropriate glucose levels will improve Outcome: Progressing   Problem: Tissue Perfusion: Goal: Adequacy of tissue perfusion will improve Outcome: Progressing   Problem: Education: Goal: Ability to describe self-care measures that may prevent or decrease complications (Diabetes Survival Skills Education) will improve Outcome: Progressing Goal: Individualized Educational Video(s) Outcome: Progressing   Problem: Fluid Volume: Goal: Ability to achieve a balanced intake and output will improve Outcome: Progressing   Problem: Metabolic: Goal: Ability to maintain appropriate glucose levels will improve Outcome: Progressing   Problem: Nutritional: Goal: Maintenance of adequate nutrition will improve Outcome: Progressing Goal: Maintenance of adequate weight for body size and type will improve Outcome: Progressing   Problem: Respiratory: Goal: Will regain and/or maintain adequate ventilation Outcome: Progressing   Problem: Urinary Elimination: Goal: Ability to achieve and maintain adequate renal perfusion and functioning will improve Outcome: Progressing   Problem: Education: Goal: Knowledge of General Education information will  improve Description: Including pain rating scale, medication(s)/side effects and non-pharmacologic comfort measures Outcome: Progressing   Problem: Health Behavior/Discharge Planning: Goal: Ability to manage health-related needs will improve Outcome: Progressing   Problem: Clinical Measurements: Goal: Ability to maintain clinical measurements within normal limits will improve Outcome: Progressing Goal: Will remain free from infection Outcome: Progressing Goal: Diagnostic test results will improve Outcome: Progressing Goal: Respiratory complications will improve Outcome: Progressing Goal: Cardiovascular complication will be avoided Outcome: Progressing   Problem: Activity: Goal: Risk for activity intolerance will decrease Outcome: Progressing   Problem: Nutrition: Goal: Adequate nutrition will be maintained Outcome: Progressing   Problem: Coping: Goal: Level of anxiety will decrease Outcome: Progressing   Problem: Pain Managment: Goal: General experience of comfort will improve Outcome: Progressing   Problem: Safety: Goal: Ability to remain free from injury will improve Outcome: Progressing   Problem: Skin Integrity: Goal: Risk for impaired skin integrity will decrease Outcome: Progressing

## 2023-04-08 NOTE — Progress Notes (Signed)
Glucose, capillary [[id number]] (Abnormal)   Collected: 04/08/23 1427   Updated: 04/08/23 1428    Glucose-Capillary 233 High  mg/dL

## 2023-04-08 NOTE — Inpatient Diabetes Management (Addendum)
Inpatient Diabetes Program Recommendations  AACE/ADA: New Consensus Statement on Inpatient Glycemic Control (2015)  Target Ranges:  Prepandial:   less than 140 mg/dL      Peak postprandial:   less than 180 mg/dL (1-2 hours)      Critically ill patients:  140 - 180 mg/dL    Latest Reference Range & Units 04/08/23 00:56  Sodium 135 - 145 mmol/L 134 (L)  Potassium 3.5 - 5.1 mmol/L 3.2 (L)  Chloride 98 - 111 mmol/L 100  CO2 22 - 32 mmol/L 23  Glucose 70 - 99 mg/dL 161 (H)  BUN 6 - 20 mg/dL 44 (H)  Creatinine 0.96 - 1.00 mg/dL 0.45 (H)  Calcium 8.9 - 10.3 mg/dL 8.4 (L)  Anion gap 5 - 15  11  (L): Data is abnormally low (H): Data is abnormally high  Latest Reference Range & Units 04/07/23 18:46 04/07/23 20:51 04/07/23 22:01 04/08/23 00:12 04/08/23 01:14 04/08/23 02:27 04/08/23 03:27 04/08/23 05:24 04/08/23 06:18  Glucose-Capillary 70 - 99 mg/dL 409 (H)  IV Insulin Drip Infusing 164 (H) 144 (H) 127 (H) 106 (H) 180 (H) 200 (H) 176 (H) 173 (H)  IV Insulin Drip Infusing  (H): Data is abnormally high    Admit with: DKA   History: Type 1 Diabetes, ESRD    Home DM Meds: Tandem Insulin Pump                              Dexcom G7 CGM   Current Orders: IV Insulin Drip     Transitioning to Semglee 12 units Daily     Novolog Sensitive Correction Scale/ SSI (0-9 units) TID AC + HS    Novolog 4 units TID with meals      Transitioning to SQ Insulin this AM  Addendum 10am--Met w/ pt at bedside.  Pt sitting on side of bed eating breakfast (omelet).  Pt reviewed the telemedicine visit she had with her ENDO on 5/21.  Pt told me her ENDO gave her "High blood sugar" settings to use on her pump and the settings were adjusted to this temporary higher settings.  Pt also relayed to me that she gave herself a 5 unit Novolog dose with a syringe at home under the supervision and advisement of her ENDO.  Pt told me she was only on the higher pump settings for about 6-8 hours prior to coming to the ED.   Pump is currently at bedside in her bag but pt does not have an extra insertion site nor a new reservoir which is needed to resume her insulin pump in a safe manner.  Does not have any family that can bring her extra supplies--Wants to go home today b/c she has a young daughter at home.  Discussed with pt that the MD has placed SQ Insulin orders with Semglee insulin and Novolog insulin to get her off the Insulin drip today.  Explained to pt that Semglee lasts 24 hours and she will need to wait 24 hours prior to resuming the basal rates on her insulin pump when she restarts her insulin pump at home.  Pt stated understanding and told me she will simply dose her Novolog for correction and carb coverage with a syringe until she puts her pump back on when she goes home.  Pt knows to contact her ENDO if she continues to have issues with her pump and can also call the pump manufacturer.   ENDO: [facility]  Endocrinology Seen by Telemedicine visit 04/06/2023 Pt's Insulin Pump settings were changed to "High blood Glucose settings" yesterday: Basal/ Correction/ Carb Ratio 12am  0.8--40--7 8am    0.9--35--6 4pm    1.1--30--6 9pm    0.9--35--7    Normal Pump Settings:  Basal/ Correction/ Carb Ratio 12am  0.5--65--8 3am    0.5--65--8 6am    0.6--50--8 11am   0.6--50--8 4pm     0.8--40--7 9pm     0.6--55--8     --Will follow patient during hospitalization--  Ambrose Finland RN, MSN, CDCES Diabetes Coordinator Inpatient Glycemic Control Team Team Pager: [phone] (8a-5p)

## 2023-04-08 NOTE — Progress Notes (Signed)
Pt discharged home by self. I was walked Patient to her her. She denies any need or question and stated understanding to discharged instructions.

## 2023-04-08 NOTE — Progress Notes (Signed)
[facility]  ROUNDING NOTE   Subjective:   .Emma Thomas is a 27 y.o female with past medical history of diabetes, anemia, and diabetes type 1, and end stage renal disease on peritoneal dialysis. Patient presents to the ED with hyperglycemia. She has been admitted for Hyperglycemia [R73.9] Diabetic ketoacidosis without coma associated with type 1 diabetes mellitus (HCC) [E10.10]  Patient is a home hemodialysis patient that completes 4 treatments a week. She is followed by Dr Cherylann Ratel. Her last treatment was on Monday. She states she was using the insulin pump to manage her blood sugar until it was elevated. She began self injecting insulin however her levels remains high. Denies new medications, excessive sugar intake.   Labs on ED arrival include sodium 130, serum bicarb 21, BUN 46, glucose 538, creatinine 7.12 and GFR 7, and Hgb 11.9. She was admitted to ICU on insulin drip.    Objective:  Vital signs in last 24 hours:  Temp:  [97.6 F (36.4 C)-98.7 F (37.1 C)] 98.7 F (37.1 C) (05/23 1200) Pulse Rate:  [80-92] 90 (05/23 0800) Resp:  [13-20] 19 (05/23 1415) BP: (121-144)/(81-101) 134/88 (05/23 1426) SpO2:  [99 %-100 %] 99 % (05/23 1426) Weight:  [71.4 kg] 71.4 kg (05/22 2332)  Weight change:  Filed Weights   04/07/23 0904 04/07/23 2332  Weight: 68 kg 71.4 kg    Intake/Output: I/O last 3 completed shifts: In: 1265 [I.V.:765; IV Piggyback:500] Out: -    Intake/Output this shift:  Total I/O In: 866.6 [P.O.:600; I.V.:266.6] Out: -   Physical Exam: General: NAD  Head: Normocephalic, atraumatic. Moist oral mucosal membranes  Eyes: Anicteric  Lungs:  Clear to auscultation, normal effort  Heart: Regular rate and rhythm  Abdomen:  Soft, nontender  Extremities:  No peripheral edema.  Neurologic: Alert and  oriented, moving all four extremities  Skin: No lesions  Access: Rt upper AVF    Basic Metabolic Panel: Recent Labs  Lab 04/07/23 1253  04/07/23 1740 04/07/23 2205 04/08/23 0056 04/08/23 0429  NA 130* 134* 135 134* 135  K 3.8 3.8 3.4* 3.2* 3.3*  CL 93* 99 99 100 100  CO2 20* 23 22 23 22   GLUCOSE 480* 222* 216* 105* 183*  BUN 44* 45* 41* 44* 42*  CREATININE 6.93* 7.15* 7.13* 7.40* 7.47*  CALCIUM 8.4* 8.2* 8.4* 8.4* 8.5*    Liver Function Tests: No results for input(s): "AST", "ALT", "ALKPHOS", "BILITOT", "PROT", "ALBUMIN" in the last 168 hours. No results for input(s): "LIPASE", "AMYLASE" in the last 168 hours. No results for input(s): "AMMONIA" in the last 168 hours.  CBC: Recent Labs  Lab 04/07/23 0907  WBC 6.4  HGB 11.9*  HCT 34.8*  MCV 87.9  PLT 309    Cardiac Enzymes: No results for input(s): "CKTOTAL", "CKMB", "CKMBINDEX", "TROPONINI" in the last 168 hours.  BNP: Invalid input(s): "POCBNP"  CBG: Recent Labs  Lab 04/08/23 0524 04/08/23 0618 04/08/23 0800 04/08/23 1148 04/08/23 1427  GLUCAP 176* 173* 123* 185* 233*    Microbiology: Results for orders placed or performed during the hospital encounter of 04/07/23  MRSA Next Gen by PCR, Nasal     Status: None   Collection Time: 04/07/23 11:46 PM   Specimen: Nasal Mucosa; Nasal Swab  Result Value Ref Range Status   MRSA by PCR Next Gen NOT DETECTED NOT DETECTED Final    Comment: (NOTE) The GeneXpert MRSA Assay (FDA approved for NASAL specimens only), is one component of a comprehensive MRSA colonization surveillance program.  It is not intended to diagnose MRSA infection nor to guide or monitor treatment for MRSA infections. Test performance is not FDA approved in patients less than 47 years old. Performed at [facility], [address] [place], [place] [address]     Coagulation Studies: No results for input(s): "LABPROT", "INR" in the last 72 hours.  Urinalysis: Recent Labs    04/07/23 1253  COLORURINE YELLOW*  LABSPEC 1.014  PHURINE 7.0  GLUCOSEU >=500*  HGBUR NEGATIVE  BILIRUBINUR NEGATIVE  KETONESUR 20*   PROTEINUR >=300*  NITRITE NEGATIVE  LEUKOCYTESUR TRACE*      Imaging: DG Lumbar Spine 2-3 Views  Result Date: 04/07/2023 CLINICAL DATA:  Low back pain. EXAM: LUMBAR SPINE - 2-3 VIEW COMPARISON:  CT abdomen and pelvis 04/19/2022 FINDINGS: There are 5 non rib-bearing lumbar type vertebrae. There is straightening of the normal lumbar lordosis without listhesis. Minimal right convex thoracolumbar spinal curvature may be positional. No fracture is identified. Intervertebral disc space heights are preserved. IMPRESSION: Largely unremarkable appearance of the lumbar spine. No acute osseous abnormality. Electronically Signed   By: Sebastian Ache M.D.   On: 04/07/2023 15:41     Medications:    anticoagulant sodium citrate     dextrose 5% lactated ringers Stopped (04/08/23 1305)   insulin Stopped (04/08/23 1017)   lactated ringers Stopped (04/07/23 1544)    amLODipine  10 mg Oral Daily   atorvastatin  80 mg Oral Daily   Chlorhexidine Gluconate Cloth  6 each Topical Q0600   Chlorhexidine Gluconate Cloth  6 each Topical Q0600   cholecalciferol  1,000 Units Oral Daily   furosemide  80 mg Oral Daily   heparin  5,000 Units Subcutaneous Q8H   insulin aspart  0-9 Units Subcutaneous TID AC & HS   insulin aspart  4 Units Subcutaneous TID WC   insulin glargine-yfgn  12 Units Subcutaneous Daily   losartan  100 mg Oral Daily   montelukast  10 mg Oral Daily   acetaminophen, alteplase, anticoagulant sodium citrate, dextrose, heparin, lidocaine (PF), lidocaine-prilocaine, ondansetron (ZOFRAN) IV, oxyCODONE, pentafluoroprop-tetrafluoroeth, SUMAtriptan  Assessment/ Plan:  [name] is a 27 y.o.  female with past medical history of diabetes, anemia, and diabetes type 1, and end stage renal disease on peritoneal dialysis. Patient presents to the ED with hyperglycemia. She has been admitted for Hyperglycemia [R73.9] Diabetic ketoacidosis without coma associated with type 1 diabetes mellitus (HCC)  [E10.10]   Hyperglycemia on Type 1 diabetes with chronic kidney disease. Insulin pump used outpatient. Glucose greater than 500 on admission. Insulin drip started. Glycemia controlled on insulin drip.   2. End stage renal disease on home hemodialysis. Last treatment received on Monday. Labs stable. Patient cleared to discharge from renal stance and can complete treatment at home.  3. Anemia of chronic kidney disease Lab Results  Component Value Date   HGB 11.9 (L) 04/07/2023    Hgb within desired range.    LOS: 0   5/23/20246:23 PM

## 2023-04-08 NOTE — Progress Notes (Signed)
2330 patient came up room ED alert x4 on insulin gtt and d5LR patient refusing CHG and refusing hospital gown patient doesn't understand why she's being admitted with normal glucose level. Education on labs and DKA reviewed with patient. Patient had right upper extremity fistula states she does HD at home 4 days week last done on Monday this week. BLE swelling noted patient states this is chronic left large toe amputation noted

## 2023-04-08 NOTE — Progress Notes (Signed)
Pt educated to start using her insulin pump as ordered and instructed by her provider.

## 2023-04-09 LAB — HEPATITIS B SURFACE ANTIBODY, QUANTITATIVE: Hep B S AB Quant (Post): 292 m[IU]/mL (ref >9.9)

## 2023-04-10 NOTE — Discharge Summary (Signed)
Physician Discharge Summary   Patient: Emma Thomas MRN: [id number] DOB: November 28, 1995  Admit date:     04/07/2023  Discharge date: 04/10/23  Discharge Physician: Kathlen Mody   PCP: Dan Humphreys, MD   Recommendations at discharge:   Please follow up with PCP in one week.  Please follow up cbc and BMP in one week.   Discharge Diagnoses: Principal Problem:   Hyperglycemia Active Problems:   Hypertension   Anemia of chronic disease   Diastolic dysfunction   CKD (chronic kidney disease) stage V requiring chronic dialysis [facility])    Hospital Course:  Emma Thomas is a 27 y.o. female with medical history significant of type 1 diabetes with insulin pump in place, end-stage renal disease on hemodialysis at home daily, hypertension, anemia of chronic disease, headache presenting with hyperglycemia.  Assessment and Plan: * Hyperglycemia/ type 1 DM  Blood sugars 400s to 500s without overt ketoacidosis in the setting of baseline type 1 diabetes She started on insulin drip, her gap closed. She was given a dose of semglee .  Her CBG'S have been well less than 200.  Diabetes education given, co ordinator consulted, recommended to start the insulin pump in 24 hours.   CKD (chronic kidney disease) stage V requiring chronic dialysis (HCC) End-stage renal disease on home hemodialysis daily Still making urine Nephrology consulted and she will resume her home HD.   Diastolic dysfunction 2D echo September 2023 with EF 55 to 60% and grade 1 diastolic dysfunction Hydrated, and she appears euvolemic.   Anemia of chronic disease Hemoglobin 12 today with a baseline hemoglobin around 8-10 Follow  Hypertension BP stable Titrate home regimen         Consultants: nephrology.  Procedures performed: none.   Disposition: Home Diet recommendation:  Renal diet DISCHARGE MEDICATION: Allergies as of 04/08/2023   No Known Allergies      Medication List     STOP taking these  medications    oxyCODONE 5 MG immediate release tablet Commonly known as: Oxy IR/ROXICODONE   POTASSIUM CHLORIDE PO       TAKE these medications    acetaminophen 500 MG tablet Commonly known as: TYLENOL Take 2 tablets (1,000 mg total) by mouth every 6 (six) hours as needed for mild pain.   amLODipine 10 MG tablet Commonly known as: NORVASC Take 1 tablet (10 mg total) by mouth daily.   atorvastatin 80 MG tablet Commonly known as: LIPITOR Take 80 mg by mouth daily.   bumetanide 1 MG tablet Commonly known as: BUMEX Take 1 mg by mouth daily.   cholecalciferol 25 MCG (1000 UNIT) tablet Commonly known as: VITAMIN D3 Take 1,000 Units by mouth daily.   Dexcom G6 Sensor Misc by Does not apply route.   ferrous gluconate 324 MG tablet Commonly known as: FERGON Take 324 mg by mouth daily with breakfast.   furosemide 80 MG tablet Commonly known as: LASIX Take 80 mg by mouth daily.   HumaLOG 100 UNIT/ML injection Generic drug: insulin lispro Inject 0-1 mLs (0-100 Units total) into the skin daily. Uses with Insulin Pump   Junel 1/20 1-20 MG-MCG tablet Generic drug: norethindrone-ethinyl estradiol Take 1 tablet by mouth daily.   losartan 100 MG tablet Commonly known as: COZAAR Take 100 mg by mouth daily.   montelukast 10 MG tablet Commonly known as: SINGULAIR Take 10 mg by mouth daily.   OVER THE COUNTER MEDICATION Tandem Insulin pump   potassium chloride 10 MEQ tablet Commonly known as:  KLOR-CON M Take 1 tablet (10 mEq total) by mouth 2 (two) times daily for 7 days.   SUMAtriptan 50 MG tablet Commonly known as: IMITREX Take 50 mg by mouth daily as needed for migraine.        Discharge Exam: Filed Weights   04/07/23 0904 04/07/23 2332  Weight: 68 kg 71.4 kg   General exam: Appears calm and comfortable  Respiratory system: Clear to auscultation. Respiratory effort normal. Cardiovascular system: S1 & S2 heard, RRR. No JVD,  Gastrointestinal system:  Abdomen is nondistended, soft and nontender.  Central nervous system: Alert and oriented. No focal neurological deficits. Extremities: Symmetric 5 x 5 power. Skin: No rashes, lesions or ulcers Psychiatry: Judgement and insight appear normal.    Condition at discharge: fair  The results of significant diagnostics from this hospitalization (including imaging, microbiology, ancillary and laboratory) are listed below for reference.   Imaging Studies: DG Lumbar Spine 2-3 Views  Result Date: 04/07/2023 CLINICAL DATA:  Low back pain. EXAM: LUMBAR SPINE - 2-3 VIEW COMPARISON:  CT abdomen and pelvis 04/19/2022 FINDINGS: There are 5 non rib-bearing lumbar type vertebrae. There is straightening of the normal lumbar lordosis without listhesis. Minimal right convex thoracolumbar spinal curvature may be positional. No fracture is identified. Intervertebral disc space heights are preserved. IMPRESSION: Largely unremarkable appearance of the lumbar spine. No acute osseous abnormality. Electronically Signed   By: Sebastian Ache M.D.   On: 04/07/2023 15:41    Microbiology: Results for orders placed or performed during the hospital encounter of 04/07/23  MRSA Next Gen by PCR, Nasal     Status: None   Collection Time: 04/07/23 11:46 PM   Specimen: Nasal Mucosa; Nasal Swab  Result Value Ref Range Status   MRSA by PCR Next Gen NOT DETECTED NOT DETECTED Final    Comment: (NOTE) The GeneXpert MRSA Assay (FDA approved for NASAL specimens only), is one component of a comprehensive MRSA colonization surveillance program. It is not intended to diagnose MRSA infection nor to guide or monitor treatment for MRSA infections. Test performance is not FDA approved in patients less than 71 years old. Performed at [facility], [address] [place], [place] [address]     Labs: CBC: Recent Labs  Lab 04/07/23 0907  WBC 6.4  HGB 11.9*  HCT 34.8*  MCV 87.9  PLT 309   Basic Metabolic Panel: Recent  Labs  Lab 04/07/23 1253 04/07/23 1740 04/07/23 2205 04/08/23 0056 04/08/23 0429  NA 130* 134* 135 134* 135  K 3.8 3.8 3.4* 3.2* 3.3*  CL 93* 99 99 100 100  CO2 20* 23 22 23 22   GLUCOSE 480* 222* 216* 105* 183*  BUN 44* 45* 41* 44* 42*  CREATININE 6.93* 7.15* 7.13* 7.40* 7.47*  CALCIUM 8.4* 8.2* 8.4* 8.4* 8.5*   Liver Function Tests: No results for input(s): "AST", "ALT", "ALKPHOS", "BILITOT", "PROT", "ALBUMIN" in the last 168 hours. CBG: Recent Labs  Lab 04/08/23 0524 04/08/23 0618 04/08/23 0800 04/08/23 1148 04/08/23 1427  GLUCAP 176* 173* 123* 185* 233*    Discharge time spent: 35 minutes.   Signed: Kathlen Mody, MD [facility] 04/10/2023

## 2023-04-22 ENCOUNTER — Ambulatory Visit
Payer: Commercial Managed Care - PPO | Attending: Student in an Organized Health Care Education/Training Program | Admitting: Student in an Organized Health Care Education/Training Program

## 2023-04-22 ENCOUNTER — Encounter: Payer: Self-pay | Admitting: Student in an Organized Health Care Education/Training Program

## 2023-04-22 VITALS — BP 168/104 | HR 94 | Temp 97.0°F | Resp 17 | Ht 66.0 in | Wt 147.7 lb

## 2023-04-22 DIAGNOSIS — G8929 Other chronic pain: Secondary | ICD-10-CM | POA: Diagnosis present

## 2023-04-22 DIAGNOSIS — M25562 Pain in left knee: Secondary | ICD-10-CM | POA: Diagnosis present

## 2023-04-22 DIAGNOSIS — E1140 Type 2 diabetes mellitus with diabetic neuropathy, unspecified: Secondary | ICD-10-CM | POA: Insufficient documentation

## 2023-04-22 DIAGNOSIS — M792 Neuralgia and neuritis, unspecified: Secondary | ICD-10-CM | POA: Diagnosis present

## 2023-04-22 DIAGNOSIS — G5791 Unspecified mononeuropathy of right lower limb: Secondary | ICD-10-CM | POA: Diagnosis present

## 2023-04-22 MED ORDER — PREGABALIN 25 MG PO CAPS
25.0000 mg | ORAL_CAPSULE | Freq: Two times a day (BID) | ORAL | 0 refills | Status: DC
Start: 2023-04-22 — End: 2023-06-21

## 2023-04-22 NOTE — Progress Notes (Signed)
Safety precautions to be maintained throughout the outpatient stay will include: orient to surroundings, keep bed in low position, maintain call bell within reach at all times, provide assistance with transfer out of bed and ambulation.   Pt advised to floow up with PCP r/t high BP today

## 2023-04-22 NOTE — Progress Notes (Signed)
Patient: Emma Thomas  Service Category: E/M  Provider: Edward Jolly, MD  DOB: 31-Aug-1996  DOS: 04/22/2023  Referring Provider: Mady Haagensen, MD  MRN: [id number]  Setting: Ambulatory outpatient  PCP: Dan Humphreys, MD  Type: New Patient  Specialty: Interventional Pain Management    Location: Office  Delivery: Face-to-face     Primary Reason(s) for Visit: Encounter for initial evaluation of one or more chronic problems (new to examiner) potentially causing chronic pain, and posing a threat to normal musculoskeletal function. (Level of risk: High) CC: Foot Pain (Bilat, left is worse) and Knee Pain (left)  HPI  Emma Thomas is a 27 y.o. year old, female patient, who comes for the first time to our practice referred by Mady Haagensen, MD for our initial evaluation of her chronic pain. 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); History of chlamydia infection; History of depression; History of gonorrhea; Hypertension; Intrauterine device; Microalbuminuric diabetic nephropathy (HCC); Neuropathy; Weight loss, non-intentional; Vitamin D deficiency; Pyelonephritis affecting pregnancy in first trimester; Supervision of high risk pregnancy, antepartum; Labor and delivery indication for care or intervention; Vomiting; ESRD on dialysis (HCC); Umbilical hernia without obstruction and without gangrene; PD catheter dysfunction (HCC); Acute osteomyelitis (HCC); Hypokalemia; Anemia of chronic disease; Anasarca; Hyponatremia; Osteomyelitis of left foot (HCC); Acute idiopathic pericarditis; Chronic hypertension affecting pregnancy; Diastolic dysfunction; Pre-eclampsia, severe, third trimester; Acute kidney injury (HCC); Anemia due to chronic kidney disease; CKD (chronic kidney  disease) stage V requiring chronic dialysis (HCC); Cesarean delivery delivered; 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 (HCC); Hyperglycemia; Chronic painful diabetic neuropathy (HCC); Neuropathic pain of foot, left; Neuropathy of foot, right; and Chronic pain of left knee on their problem list. Today she comes in for evaluation of her Foot Pain (Bilat, left is worse) and Knee Pain (left)  Pain Assessment: Location: Right, Left Foot (and LEFT knee; since 3 months ago) Radiating: denies Onset: More than a month ago Duration: Chronic pain Quality: Tingling, Throbbing, Cramping, Aching, Sharp, Shooting, Tender Severity: 6 /10 (subjective, self-reported pain score)  Effect on ADL: limits daily activities Timing: Constant Modifying factors: tylenol eases pain, percocet BP: (!) 168/104  HR: 94  Onset and Duration: Present longer than 3 months Cause of pain: Unknown Severity: Getting worse, NAS-11 at its worse: 8/10, NAS-11 at its best: 5/10, NAS-11 now: 6/10, and NAS-11 on the average: 6/10 Timing: Afternoon, Night, and During activity or exercise Aggravating Factors: Climbing, Kneeling, Prolonged standing, Walking, and Walking uphill Alleviating Factors: Medications Associated Problems: Day-time cramps, Night-time cramps, Fatigue, Inability to concentrate, Numbness, Spasms, Swelling, Tingling, Vomiting , Pain that wakes patient up, and Pain that does not allow patient to sleep Quality of Pain: Aching, Agonizing, Annoying, Constant, Cramping, Disabling, Feeling of constriction, Getting longer, Nagging, Pulsating, Sharp, Shooting, Tender, Throbbing, Tingling, and Uncomfortable Previous Examinations or Tests: X-rays Previous Treatments: OTC  Emma Thomas is being evaluated for possible interventional pain management therapies for the treatment of her chronic pain.   Emma Thomas is a 27 year old female with end-stage renal disease currently on dialysis who presents with a chief  complaint of bilateral foot pain which she describes as burning and tingling and throbbing along with left knee pain.  Her bilateral foot pain is related to diabetic neuropathy.  She has tried gabapentin in the past with limited response.  She has tried a home directed exercise program which was not helpful.  She has not tried Lyrica.  She also endorses left knee pain that is chronic in nature.  It is worse with weightbearing.  No trauma noted.  She has tried acetaminophen for her left knee with limited response.  Meds   Current Outpatient Medications:    acetaminophen (TYLENOL) 500 MG tablet, Take 2 tablets (1,000 mg total) by mouth every 6 (six) hours as needed for mild pain., Disp: , Rfl:    amLODipine (NORVASC) 10 MG tablet, Take 1 tablet (10 mg total) by mouth daily., Disp: 30 tablet, Rfl: 0   atorvastatin (LIPITOR) 80 MG tablet, Take 80 mg by mouth daily., Disp: , Rfl:    bumetanide (BUMEX) 1 MG tablet, Take 1 mg by mouth daily., Disp: , Rfl:    cholecalciferol (VITAMIN D3) 25 MCG (1000 UNIT) tablet, Take 1,000 Units by mouth daily., Disp: , Rfl:    Continuous Blood Gluc Sensor (DEXCOM G6 SENSOR) MISC, by Does not apply route., Disp: , Rfl:    ferrous gluconate (FERGON) 324 MG tablet, Take 324 mg by mouth daily with breakfast., Disp: , Rfl:    furosemide (LASIX) 80 MG tablet, Take 80 mg by mouth daily., Disp: , Rfl:    HUMALOG 100 UNIT/ML injection, Inject 0-1 mLs (0-100 Units total) into the skin daily. Uses with Insulin Pump, Disp: 10 mL, Rfl: 11   JUNEL 1/20 1-20 MG-MCG tablet, Take 1 tablet by mouth daily., Disp: , Rfl:    losartan (COZAAR) 100 MG tablet, Take 100 mg by mouth daily., Disp: , Rfl:    montelukast (SINGULAIR) 10 MG tablet, Take 10 mg by mouth daily., Disp: , Rfl:    OVER THE COUNTER MEDICATION, Tandem Insulin pump, Disp: , Rfl:    potassium chloride (KLOR-CON M) 10 MEQ tablet, Take 1 tablet (10 mEq total) by mouth 2 (two) times daily for 7 days., Disp: 14 tablet, Rfl: 0    pregabalin (LYRICA) 25 MG capsule, Take 1 capsule (25 mg total) by mouth 2 (two) times daily., Disp: 60 capsule, Rfl: 0   SUMAtriptan (IMITREX) 50 MG tablet, Take 50 mg by mouth daily as needed for migraine., Disp: , Rfl:   Imaging Review  Narrative CLINICAL DATA:  Low back pain.  EXAM: LUMBAR SPINE - 2-3 VIEW  COMPARISON:  CT abdomen and pelvis 04/19/2022  FINDINGS: There are 5 non rib-bearing lumbar type vertebrae. There is straightening of the normal lumbar lordosis without listhesis. Minimal right convex thoracolumbar spinal curvature may be positional. No fracture is identified. Intervertebral disc space heights are preserved.  IMPRESSION: Largely unremarkable appearance of the lumbar spine. No acute osseous abnormality.   Electronically Signed By: Sebastian Ache M.D. On: 04/07/2023 15:41  DG Foot Complete Left  Narrative CLINICAL DATA:  Left foot and great toe pain and swelling. Patient reports the nail of her great toe falling off months ago.  EXAM: LEFT FOOT - COMPLETE 3+ VIEW  COMPARISON:  None Available.  FINDINGS: The bones are demineralized. There is cortical destruction of the distal 1st phalanx which is highly suspicious for osteomyelitis. The proximal phalanx appears intact. The additional digits and metatarsals appear intact. There are moderately advanced degenerative changes at the articulation between the medial cuneiform and the navicular.  There is medial forefoot soft tissue swelling without evidence of foreign body or soft tissue emphysema.  IMPRESSION: 1. Cortical destruction of the distal 1st phalanx is highly suspicious for osteomyelitis. 2. Medial forefoot  soft tissue swelling without evidence of foreign body or soft tissue emphysema.   Electronically Signed By: Carey Bullocks M.D. On: 11/20/2022 13:10   Complexity Note: Imaging results reviewed.                         ROS  Cardiovascular: High blood pressure Pulmonary or  Respiratory: No reported pulmonary signs or symptoms such as wheezing and difficulty taking a deep full breath (Asthma), difficulty blowing air out (Emphysema), coughing up mucus (Bronchitis), persistent dry cough, or temporary stoppage of breathing during sleep Neurological: No reported neurological signs or symptoms such as seizures, abnormal skin sensations, urinary and/or fecal incontinence, being born with an abnormal open spine and/or a tethered spinal cord Psychological-Psychiatric: No reported psychological or psychiatric signs or symptoms such as difficulty sleeping, anxiety, depression, delusions or hallucinations (schizophrenial), mood swings (bipolar disorders) or suicidal ideations or attempts Gastrointestinal: No reported gastrointestinal signs or symptoms such as vomiting or evacuating blood, reflux, heartburn, alternating episodes of diarrhea and constipation, inflamed or scarred liver, or pancreas or irrregular and/or infrequent bowel movements Genitourinary: Kidney disease and Difficulty producing urine (Renal failure) Hematological: No reported hematological signs or symptoms such as prolonged bleeding, low or poor functioning platelets, bruising or bleeding easily, hereditary bleeding problems, low energy levels due to low hemoglobin or being anemic Endocrine: High blood sugar requiring insulin (IDDM) Rheumatologic: No reported rheumatological signs and symptoms such as fatigue, joint pain, tenderness, swelling, redness, heat, stiffness, decreased range of motion, with or without associated rash Musculoskeletal: Negative for myasthenia gravis, muscular dystrophy, multiple sclerosis or malignant hyperthermia Work History: not reported  Allergies  Emma Thomas has No Known Allergies.  Laboratory Chemistry Profile   Renal Lab Results  Component Value Date   BUN 42 (H) 04/08/2023   CREATININE 7.47 (H) 04/08/2023   LABCREA 58 02/13/2020   BCR 15 10/25/2019   GFRAA 55 (L)  02/13/2020   GFRNONAA 7 (L) 04/08/2023   PROTEINUR >=300 (A) 04/07/2023     Electrolytes Lab Results  Component Value Date   NA 135 04/08/2023   K 3.3 (L) 04/08/2023   CL 100 04/08/2023   CALCIUM 8.5 (L) 04/08/2023   MG 2.0 11/22/2022   PHOS 4.4 11/26/2022     Hepatic Lab Results  Component Value Date   AST 53 (H) 11/21/2022   ALT 50 (H) 11/21/2022   ALBUMIN 1.9 (L) 11/26/2022   ALKPHOS 67 11/21/2022   LIPASE 21 04/19/2022     ID Lab Results  Component Value Date   HIV Non Reactive 04/20/2022   SARSCOV2NAA NEGATIVE 02/13/2020   STAPHAUREUS NEGATIVE 11/23/2022   MRSAPCR NEGATIVE 11/23/2022   PREGTESTUR NEGATIVE 04/07/2023     Bone No results found for: "VD25OH", "VD125OH2TOT", "ZO1096EA5", "WU9811BJ4", "25OHVITD1", "25OHVITD2", "25OHVITD3", "TESTOFREE", "TESTOSTERONE"   Endocrine Lab Results  Component Value Date   GLUCOSE 183 (H) 04/08/2023   GLUCOSEU >=500 (A) 04/07/2023   HGBA1C 7.9 (H) 11/20/2022     Neuropathy Lab Results  Component Value Date   VITAMINB12 376 11/01/2019   HGBA1C 7.9 (H) 11/20/2022   HIV Non Reactive 04/20/2022     CNS No results found for: "COLORCSF", "APPEARCSF", "RBCCOUNTCSF", "WBCCSF", "POLYSCSF", "LYMPHSCSF", "EOSCSF", "PROTEINCSF", "GLUCCSF", "JCVIRUS", "CSFOLI", "IGGCSF", "LABACHR", "ACETBL"   Inflammation (CRP: Acute  ESR: Chronic) Lab Results  Component Value Date   CRP 3.0 (H) 11/27/2022   ESRSEDRATE 128 (H) 11/27/2022   LATICACIDVEN 0.8 11/20/2022     Rheumatology No results found for: "  RF", "ANA", "LABURIC", "URICUR", "LYMEIGGIGMAB", "LYMEABIGMQN", "HLAB27"   Coagulation Lab Results  Component Value Date   INR 1.16 08/16/2017   LABPROT 14.7 08/16/2017   PLT 309 04/07/2023     Cardiovascular Lab Results  Component Value Date   TROPONINI 0.03 (HH) 08/16/2017   HGB 11.9 (L) 04/07/2023   HCT 34.8 (L) 04/07/2023     Screening Lab Results  Component Value Date   SARSCOV2NAA NEGATIVE 02/13/2020   STAPHAUREUS  NEGATIVE 11/23/2022   MRSAPCR NEGATIVE 11/23/2022   HIV Non Reactive 04/20/2022   PREGTESTUR NEGATIVE 04/07/2023     Cancer No results found for: "CEA", "CA125", "LABCA2"   Allergens No results found for: "ALMOND", "APPLE", "ASPARAGUS", "AVOCADO", "BANANA", "BARLEY", "BASIL", "BAYLEAF", "GREENBEAN", "LIMABEAN", "WHITEBEAN", "BEEFIGE", "REDBEET", "BLUEBERRY", "BROCCOLI", "CABBAGE", "MELON", "CARROT", "CASEIN", "CASHEWNUT", "CAULIFLOWER", "CELERY"     Note: Lab results reviewed.  PFSH  Drug: Emma Thomas  reports no history of drug use. Alcohol:  reports that she does not currently use alcohol after a past usage of about 2.0 standard drinks of alcohol per week. Tobacco:  reports that she has never smoked. She has been exposed to tobacco smoke. She has never used smokeless tobacco. Medical:  has a past medical history of Anemia, Diabetes mellitus without complication (HCC), Essential hypertension, Headache, Hypertension (03/04/2013), Neurologic disorder, Neuromuscular disorder (HCC), Recurrent UTI, and Renal disorder. Family: family history includes Breast cancer (age of onset: 39) in her mother; Diabetes type II in her paternal grandmother; Lung cancer in her maternal grandmother.  Past Surgical History:  Procedure Laterality Date   abscess removal     excision of bartholin cyst   AMPUTATION Left 11/23/2022   Procedure: LEFT GREAT TOE PARTIAL RAY AMPUTATION;  Surgeon: Linus Galas, DPM;  Location: [facility];  Service: Podiatry;  Laterality: Left;   CAPD INSERTION N/A 06/09/2022   Procedure: LAPAROSCOPIC INSERTION CONTINUOUS AMBULATORY PERITONEAL DIALYSIS  (CAPD) CATHETER, PD rep to be present;  Surgeon: Henrene Dodge, MD;  Location: [facility];  Service: General;  Laterality: N/A;   CAPD INSERTION N/A 12/15/2022   Procedure: LAPAROSCOPIC INSERTION CONTINUOUS AMBULATORY PERITONEAL DIALYSIS  (CAPD) CATHETER;  Surgeon: Henrene Dodge, MD;  Location: [facility];  Service: General;  Laterality: N/A;   CAPD  REMOVAL N/A 12/15/2022   Procedure: LAPAROSCOPIC REMOVAL CONTINUOUS AMBULATORY PERITONEAL DIALYSIS  (CAPD) CATHETER, removal of old catheter;  Surgeon: Henrene Dodge, MD;  Location: [facility];  Service: General;  Laterality: N/A;   CAPD REMOVAL N/A 01/08/2023   Procedure: LAPAROSCOPIC REMOVAL CONTINUOUS AMBULATORY PERITONEAL DIALYSIS  (CAPD) CATHETER;  Surgeon: Henrene Dodge, MD;  Location: [facility];  Service: General;  Laterality: N/A;   CAPD REVISION N/A 07/16/2022   Procedure: LAPAROSCOPIC REVISION CONTINUOUS AMBULATORY PERITONEAL DIALYSIS  (CAPD) CATHETER;  Surgeon: Henrene Dodge, MD;  Location: [facility];  Service: General;  Laterality: N/A;   CAPD REVISION N/A 11/24/2022   Procedure: LAPAROSCOPIC REVISION CONTINUOUS AMBULATORY PERITONEAL DIALYSIS  (CAPD) CATHETER;  Surgeon: Henrene Dodge, MD;  Location: [facility];  Service: General;  Laterality: N/A;   DIALYSIS/PERMA CATHETER INSERTION N/A 04/24/2022   Procedure: DIALYSIS/PERMA CATHETER INSERTION;  Surgeon: Renford Dills, MD;  Location: [facility];  Service: Cardiovascular;  Laterality: N/A;   DIALYSIS/PERMA CATHETER REMOVAL N/A 06/02/2022   Procedure: DIALYSIS/PERMA CATHETER REMOVAL;  Surgeon: Renford Dills, MD;  Location: [facility];  Service: Cardiovascular;  Laterality: N/A;   DIALYSIS/PERMA CATHETER REMOVAL N/A 12/28/2022   Procedure: DIALYSIS/PERMA CATHETER REMOVAL;  Surgeon: Annice Needy, MD;  Location: [facility]  INVASIVE CV LAB;  Service: Cardiovascular;  Laterality: N/A;   INSERTION OF DIALYSIS CATHETER Right    ARM   IR FLUORO GUIDE CV LINE RIGHT  11/27/2022   IR US GUIDE VASC ACCESS RIGHT  11/27/2022   Nexplanon  01/2011   UMBILICAL HERNIA REPAIR N/A 06/09/2022   Procedure: HERNIA REPAIR UMBILICAL ADULT;  Surgeon: Henrene Dodge, MD;  Location: [facility];  Service: General;  Laterality: N/A;   Active Ambulatory Problems    Diagnosis Date Noted   DKA (diabetic ketoacidoses) 09/09/2015   Pyelonephritis 07/30/2016    High anion gap metabolic acidosis    AKI (acute kidney injury) (HCC)    Liver function abnormality 08/15/2018   Abdominal pain    Depression 10/27/2019   Depression, major, recurrent, moderate (HCC) 03/06/2013   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) 10/27/2019   History of chlamydia infection 10/27/2019   History of depression 10/27/2019   History of gonorrhea 08/13/2015   Hypertension 03/04/2013   Intrauterine device 05/03/2015   Microalbuminuric diabetic nephropathy (HCC) 08/15/2018   Neuropathy 04/13/2013   Weight loss, non-intentional 04/13/2013   Vitamin D deficiency 10/27/2019   Pyelonephritis affecting pregnancy in first trimester 11/01/2019   Supervision of high risk pregnancy, antepartum 11/01/2019   Labor and delivery indication for care or intervention 02/13/2020   Vomiting 04/19/2022   ESRD on dialysis (HCC) 04/22/2022   Umbilical hernia without obstruction and without gangrene    PD catheter dysfunction (HCC)    Acute osteomyelitis (HCC) 11/20/2022   Hypokalemia 11/20/2022   Anemia of chronic disease 11/21/2022   Anasarca 11/21/2022   Hyponatremia 11/22/2022   Osteomyelitis of left foot (HCC) 11/25/2022   Acute idiopathic pericarditis 08/31/2022   Chronic hypertension affecting pregnancy 11/08/2019   Diastolic dysfunction 08/31/2022   Pre-eclampsia, severe, third trimester 02/15/2020   Acute kidney injury (HCC) 12/26/2021   Anemia due to chronic kidney disease 11/08/2019   CKD (chronic kidney disease) stage V requiring chronic dialysis (HCC) 05/22/2022   Cesarean delivery delivered 02/26/2020   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 (HCC) 12/11/2022   Hyperglycemia 04/07/2023   Chronic painful diabetic neuropathy (HCC) 04/22/2023   Neuropathic pain of foot, left 04/22/2023   Neuropathy of foot, right 04/22/2023   Chronic pain of left knee 04/22/2023   Resolved Ambulatory Problems    Diagnosis Date Noted   No  Resolved Ambulatory Problems   Past Medical History:  Diagnosis Date   Anemia    Diabetes mellitus without complication (HCC)    Essential hypertension    Headache    Neurologic disorder    Neuromuscular disorder (HCC)    Recurrent UTI    Renal disorder    Constitutional Exam  General appearance: Well nourished, well developed, and well hydrated. In no apparent acute distress Vitals:   04/22/23 0939 04/22/23 0955  BP: (!) 158/110 (!) 168/104  Pulse: 94   Resp: 17   Temp: (!) 97 F (36.1 C)   TempSrc: Temporal   SpO2: 100%   Weight: 147 lb 11.3 oz (67 kg)   Height: 5\' 6"  (1.676 m)    BMI Assessment: Estimated body mass index is 23.84 kg/m as calculated from the following:   Height as of this encounter: 5\' 6"  (1.676 m).   Weight as of this encounter: 147 lb 11.3 oz (67 kg).  BMI interpretation table: BMI level Category Range association with higher incidence of chronic pain  <18 kg/m2 Underweight   18.5-24.9  kg/m2 Ideal body weight   25-29.9 kg/m2 Overweight Increased incidence by 20%  30-34.9 kg/m2 Obese (Class I) Increased incidence by 68%  35-39.9 kg/m2 Severe obesity (Class II) Increased incidence by 136%  >40 kg/m2 Extreme obesity (Class III) Increased incidence by 254%   Patient's current BMI Ideal Body weight  Body mass index is 23.84 kg/m. Ideal body weight: 59.3 kg (130 lb 11.7 oz) Adjusted ideal body weight: 62.4 kg (137 lb 8.4 oz)   BMI Readings from Last 4 Encounters:  04/22/23 23.84 kg/m  04/07/23 25.41 kg/m  01/08/23 26.72 kg/m  12/30/22 26.73 kg/m   Wt Readings from Last 4 Encounters:  04/22/23 147 lb 11.3 oz (67 kg)  04/07/23 157 lb 6.5 oz (71.4 kg)  01/08/23 165 lb 9.1 oz (75.1 kg)  12/30/22 165 lb 9.6 oz (75.1 kg)    Psych/Mental status: Alert, oriented x 3 (person, place, & time)       Eyes: PERLA Respiratory: No evidence of acute respiratory distress  Cervical Spine Area Exam  Skin & Axial Inspection: No masses, redness, edema,  swelling, or associated skin lesions Alignment: Symmetrical Functional ROM: Unrestricted ROM      Stability: No instability detected Muscle Tone/Strength: Functionally intact. No obvious neuro-muscular anomalies detected. Sensory (Neurological): Unimpaired Palpation: No palpable anomalies             Upper Extremity (UE) Exam    Side: Right upper extremity  Side: Left upper extremity  Skin & Extremity Inspection: Skin color, temperature, and hair growth are WNL. No peripheral edema or cyanosis. No masses, redness, swelling, asymmetry, or associated skin lesions. No contractures.  Skin & Extremity Inspection: Skin color, temperature, and hair growth are WNL. No peripheral edema or cyanosis. No masses, redness, swelling, asymmetry, or associated skin lesions. No contractures.  Functional ROM: Unrestricted ROM          Functional ROM: Unrestricted ROM          Muscle Tone/Strength: Functionally intact. No obvious neuro-muscular anomalies detected.  Muscle Tone/Strength: Functionally intact. No obvious neuro-muscular anomalies detected.  Sensory (Neurological): Unimpaired          Sensory (Neurological): Unimpaired          Palpation: No palpable anomalies              Palpation: No palpable anomalies              Provocative Test(s):  Phalen's test: deferred Tinel's test: deferred Apley's scratch test (touch opposite shoulder):  Action 1 (Across chest): deferred Action 2 (Overhead): deferred Action 3 (LB reach): deferred   Provocative Test(s):  Phalen's test: deferred Tinel's test: deferred Apley's scratch test (touch opposite shoulder):  Action 1 (Across chest): deferred Action 2 (Overhead): deferred Action 3 (LB reach): deferred    Thoracic Spine Area Exam  Skin & Axial Inspection: No masses, redness, or swelling Alignment: Symmetrical Functional ROM: Unrestricted ROM Stability: No instability detected Muscle Tone/Strength: Functionally intact. No obvious neuro-muscular anomalies  detected. Sensory (Neurological): Unimpaired Muscle strength & Tone: No palpable anomalies Lumbar Spine Area Exam  Skin & Axial Inspection: No masses, redness, or swelling Alignment: Symmetrical Functional ROM: Unrestricted ROM       Stability: No instability detected Muscle Tone/Strength: Functionally intact. No obvious neuro-muscular anomalies detected. Sensory (Neurological): Unimpaired Palpation: No palpable anomalies       Provocative Tests: Hyperextension/rotation test: deferred today       Lumbar quadrant test (Kemp's test): deferred today       Lateral  bending test: deferred today       Patrick's Maneuver: deferred today                   FABER* test: deferred today                   S-I anterior distraction/compression test: deferred today         S-I lateral compression test: deferred today         S-I Thigh-thrust test: deferred today         S-I Gaenslen's test: deferred today         *(Flexion, ABduction and External Rotation) Gait & Posture Assessment  Ambulation: Unassisted Gait: Relatively normal for age and body habitus Posture: WNL   Lower Extremity Exam    Side: Right lower extremity  Side: Left lower extremity  Stability: No instability observed          Stability: No instability observed          Skin & Extremity Inspection: Skin color, temperature, and hair growth are WNL. No peripheral edema or cyanosis. No masses, redness, swelling, asymmetry, or associated skin lesions. No contractures.  Skin & Extremity Inspection:  left great toe amputation  Functional ROM: Unrestricted ROM                  Functional ROM: Pain restricted ROM left knee and left foot          Muscle Tone/Strength: Functionally intact. No obvious neuro-muscular anomalies detected.  Muscle Tone/Strength: Functionally intact. No obvious neuro-muscular anomalies detected.  Sensory (Neurological): Unimpaired        Sensory (Neurological): Arthropathic arthralgia for the knee, neurogenic for the  left foot        DTR: Patellar: deferred today Achilles: deferred today Plantar: deferred today  DTR: Patellar: deferred today Achilles: deferred today Plantar: deferred today  Palpation: No palpable anomalies  Palpation: No palpable anomalies    Assessment  Primary Diagnosis & Pertinent Problem List: The primary encounter diagnosis was Chronic painful diabetic neuropathy (HCC). Diagnoses of Neuropathic pain of foot, left, Neuropathy of foot, right, and Chronic pain of left knee were also pertinent to this visit.  Visit Diagnosis (New problems to examiner): 1. Chronic painful diabetic neuropathy (HCC)   2. Neuropathic pain of foot, left   3. Neuropathy of foot, right   4. Chronic pain of left knee    Plan of Care (Initial workup plan)  1. Chronic painful diabetic neuropathy (HCC) - NEUROLYSIS; Future - pregabalin (LYRICA) 25 MG capsule; Take 1 capsule (25 mg total) by mouth 2 (two) times daily.  Dispense: 60 capsule; Refill: 0  2. Neuropathic pain of foot, left - NEUROLYSIS; Future - pregabalin (LYRICA) 25 MG capsule; Take 1 capsule (25 mg total) by mouth 2 (two) times daily.  Dispense: 60 capsule; Refill: 0  3. Neuropathy of foot, right - NEUROLYSIS; Future - pregabalin (LYRICA) 25 MG capsule; Take 1 capsule (25 mg total) by mouth 2 (two) times daily.  Dispense: 60 capsule; Refill: 0  4. Chronic pain of left knee - DG Knee Complete 4 Views Left; Future - KNEE INJECTION; Future - pregabalin (LYRICA) 25 MG capsule; Take 1 capsule (25 mg total) by mouth 2 (two) times daily.  Dispense: 60 capsule; Refill: 0  Recommend Qutenza for painful diabetic neuropathy of bilateral feet Also recommend viscosupplementation, left knee injection with Hyalgan Start Lyrica as below.  Imaging Orders  DG Knee Complete 4 Views Left      Procedure Orders         NEUROLYSIS         KNEE INJECTION     Pharmacotherapy (current): Medications ordered:  Meds ordered this encounter   Medications   pregabalin (LYRICA) 25 MG capsule    Sig: Take 1 capsule (25 mg total) by mouth 2 (two) times daily.    Dispense:  60 capsule    Refill:  0    Fill one day early if pharmacy is closed on scheduled refill date. May substitute for generic if available.   Medications administered during this visit: Arya A. Chittum had no medications administered during this visit.   Analgesic Pharmacotherapy:  Opioid Analgesics: For patients currently taking or requesting to take opioid analgesics, in accordance with [facility] Guidelines, we will assess their risks and indications for the use of these substances. After completing our evaluation, we may offer recommendations, but we no longer take patients for medication management. The prescribing physician will ultimately decide, based on his/her training and level of comfort whether to adopt any of the recommendations, including whether or not to prescribe such medicines.  Membrane stabilizer: To be determined at a later time  Muscle relaxant: To be determined at a later time  NSAID: To be determined at a later time  Other analgesic(s): To be determined at a later time   Interventional management options: Emma Thomas was informed that there is no guarantee that she would be a candidate for interventional therapies. The decision will be based on the results of diagnostic studies, as well as Emma Thomas's risk profile.  Procedure(s) under consideration:  Qutenza Knee injection     Provider-requested follow-up: Return in about 20 days (around 05/12/2023) for Qutenza and left knee hyalgan.  Future Appointments  Date Time Provider Department Center  05/12/2023  2:20 PM Edward Jolly, MD [facility] None  10/06/2023 11:30 AM Deirdre Evener, MD [facility] None    Duration of encounter: .  Total time on encounter, as per AMA guidelines included both the face-to-face and non-face-to-face time personally spent by the  physician and/or other qualified health care professional(s) on the day of the encounter (includes time in activities that require the physician or other qualified health care professional and does not include time in activities normally performed by clinical staff). Physician's time may include the following activities when performed: Preparing to see the patient (e.g., pre-charting review of records, searching for previously ordered imaging, lab work, and nerve conduction tests) Review of prior analgesic pharmacotherapies. Reviewing PMP Interpreting ordered tests (e.g., lab work, imaging, nerve conduction tests) Performing post-procedure evaluations, including interpretation of diagnostic procedures Obtaining and/or reviewing separately obtained history Performing a medically appropriate examination and/or evaluation Counseling and educating the patient/family/caregiver Ordering medications, tests, or procedures Referring and communicating with other health care professionals (when not separately reported) Documenting clinical information in the electronic or other health record Independently interpreting results (not separately reported) and communicating results to the patient/ family/caregiver Care coordination (not separately reported)  Note by: Edward Jolly, MD (TTS technology used. I apologize for any typographical errors that were not detected and corrected.) Date: 04/22/2023; Time: 11:00 AM

## 2023-04-22 NOTE — Progress Notes (Signed)
Safety precautions to be maintained throughout the outpatient stay will include: orient to surroundings, keep bed in low position, maintain call bell within reach at all times, provide assistance with transfer out of bed and ambulation.  

## 2023-04-22 NOTE — Patient Instructions (Signed)
You will have x-rays completed prior to next appt. with pain clinic  Capsaicin Patches What is this medication? CAPSAICIN (cap SAY sin) relieves minor pain in your muscles and joints. It works by making your skin feel warm or cool, which blocks pain signals going to the brain. This medicine may be used for other purposes; ask your health care provider or pharmacist if you have questions. COMMON BRAND NAME(S): Qutenza What should I tell my care team before I take this medication? They need to know if you have any of these conditions: Broken or irritated skin High blood pressure History of heart attack or stroke An unusual or allergic reaction to capsaicin, hot peppers, other medications, foods, dyes, or preservatives Pregnant or trying to get pregnant Breast-feeding How should I use this medication? This medication is for external use only. It is applied by your care team in a hospital or clinic setting. Talk to your care team about the use of this medication in children. Special care may be needed. Overdosage: If you think you have taken too much of this medicine contact a poison control center or emergency room at once. NOTE: This medicine is only for you. Do not share this medicine with others. What if I miss a dose? This does not apply. What may interact with this medication? Interactions are not expected. Do not use any other skin products on the affected area without asking your care team. This list may not describe all possible interactions. Give your health care provider a list of all the medicines, herbs, non-prescription drugs, or dietary supplements you use. Also tell them if you smoke, drink alcohol, or use illegal drugs. Some items may interact with your medicine. What should I watch for while using this medication? Your condition will be monitored carefully while you are receiving this medication. Your blood pressure may go up during the procedure. Do not touch the  medication patch during treatment. This medication causes red, burning skin. You may need pain medication for during and after the procedure. This medication can make you more sensitive to heat for a few days after treatment. Be careful in hot showers or baths. Keep out of the sun. Exercise may make the treated skin feel hotter. Tell your care team if your symptoms do not start to get better or if they get worse. What side effects may I notice from receiving this medication? Side effects that you should report to your care team as soon as possible: Allergic reactions--skin rash, itching, hives, swelling of the face, lips, tongue, or throat Side effects that usually do not require medical attention (report these to your care team if they continue or are bothersome): Mild skin irritation, redness, or dryness This list may not describe all possible side effects. Call your doctor for medical advice about side effects. You may report side effects to FDA at 1-800-FDA-1088. Where should I keep my medication? This medication is given in a hospital or clinic. It will not be stored at home. NOTE: This sheet is a summary. It may not cover all possible information. If you have questions about this medicine, talk to your doctor, pharmacist, or health care provider.  2024 Elsevier/Gold Standard (2021-09-02 00:00:00) Sodium Hyaluronate intra-articular injection What is this medication? SODIUM HYALURONATE (SOE dee um hye al yoor ON ate) is used to treat pain in the knee due to osteoarthritis. This medicine may be used for other purposes; ask your health care provider or pharmacist if you have questions. This medicine  may be used for other purposes; ask your health care provider or pharmacist if you have questions. COMMON BRAND NAME(S): Amvisc, DUROLANE, Euflexxa, GELSYN-3, Hyalgan, Hymovis, Monovisc, Orthovisc, Supartz, Supartz FX, TriVisc, VISCO What should I tell my care team before I take this  medication? They need to know if you have any of these conditions: bleeding disorders glaucoma infection in the knee joint skin conditions or sensitivity skin infection an unusual allergic reaction to sodium hyaluronate, other medicines, foods, dyes, or preservatives. Different brands of sodium hyaluronate contain different allergens. Some may contain egg. Talk to your doctor about your allergies to make sure that you get the right product. pregnant or trying to get pregnant breast-feeding How should I use this medication? This medicine is for injection into the knee joint. It is given by a health care professional in a hospital or clinic setting. Talk to your pediatrician regarding the use of this medicine in children. Special care may be needed. Overdosage: If you think you have taken too much of this medicine contact a poison control center or emergency room at once. NOTE: This medicine is only for you. Do not share this medicine with others. Overdosage: If you think you have taken too much of this medicine contact a poison control center or emergency room at once. NOTE: This medicine is only for you. Do not share this medicine with others. What if I miss a dose? This does not apply. What may interact with this medication? Interactions are not expected. This list may not describe all possible interactions. Give your health care provider a list of all the medicines, herbs, non-prescription drugs, or dietary supplements you use. Also tell them if you smoke, drink alcohol, or use illegal drugs. Some items may interact with your medicine. This list may not describe all possible interactions. Give your health care provider a list of all the medicines, herbs, non-prescription drugs, or dietary supplements you use. Also tell them if you smoke, drink alcohol, or use illegal drugs. Some items may interact with your medicine. What should I watch for while using this medication? Tell your doctor or  healthcare professional if your symptoms do not start to get better or if they get worse. If receiving this medicine for osteoarthritis, limit your activity after you receive your injection. Avoid physical activity for 48 hours following your injection to keep your knee from swelling. Do not stand on your feet for more than 1 hour at a time during the first 48 hours following your injection. Ask your doctor or healthcare professional about when you can begin major physical activity again. What side effects may I notice from receiving this medication? Side effects that you should report to your doctor or health care professional as soon as possible: allergic reactions like skin rash, itching or hives, swelling of the face, lips, or tongue dizziness facial flushing pain, tingling, numbness in the hands or feet vision changes if received this medicine during eye surgery Side effects that usually do not require medical attention (report to your doctor or health care professional if they continue or are bothersome): back pain bruising at site where injected chills diarrhea fever headache joint pain joint stiffness joint swelling muscle cramps muscle pain nausea, vomiting pain, redness, or irritation at site where injected weak or tired This list may not describe all possible side effects. Call your doctor for medical advice about side effects. You may report side effects to FDA at 1-800-FDA-1088. This list may not describe all possible side effects.  Call your doctor for medical advice about side effects. You may report side effects to FDA at 1-800-FDA-1088. Where should I keep my medication? This drug is given in a hospital or clinic and will not be stored at home. NOTE: This sheet is a summary. It may not cover all possible information. If you have questions about this medicine, talk to your doctor, pharmacist, or health care provider.  2024 Elsevier/Gold Standard (2015-12-05 00:00:00)

## 2023-05-12 ENCOUNTER — Ambulatory Visit
Payer: Commercial Managed Care - PPO | Attending: Student in an Organized Health Care Education/Training Program | Admitting: Student in an Organized Health Care Education/Training Program

## 2023-06-18 ENCOUNTER — Inpatient Hospital Stay
Admission: EM | Admit: 2023-06-18 | Discharge: 2023-06-21 | DRG: 617 | Disposition: A | Discharge status: Home / Self Care | Payer: 59 | Source: Ambulatory Visit | Attending: Student | Admitting: Student

## 2023-06-18 ENCOUNTER — Emergency Department: Payer: 59

## 2023-06-18 ENCOUNTER — Encounter: Payer: Self-pay | Admitting: Family Medicine

## 2023-06-18 ENCOUNTER — Other Ambulatory Visit: Payer: Self-pay

## 2023-06-18 DIAGNOSIS — E1065 Type 1 diabetes mellitus with hyperglycemia: Secondary | ICD-10-CM | POA: Diagnosis not present

## 2023-06-18 DIAGNOSIS — D638 Anemia in other chronic diseases classified elsewhere: Secondary | ICD-10-CM | POA: Diagnosis not present

## 2023-06-18 DIAGNOSIS — E559 Vitamin D deficiency, unspecified: Secondary | ICD-10-CM | POA: Diagnosis present

## 2023-06-18 DIAGNOSIS — E1042 Type 1 diabetes mellitus with diabetic polyneuropathy: Secondary | ICD-10-CM | POA: Diagnosis present

## 2023-06-18 DIAGNOSIS — L03116 Cellulitis of left lower limb: Secondary | ICD-10-CM | POA: Diagnosis present

## 2023-06-18 DIAGNOSIS — Z992 Dependence on renal dialysis: Secondary | ICD-10-CM

## 2023-06-18 DIAGNOSIS — M7989 Other specified soft tissue disorders: Secondary | ICD-10-CM | POA: Diagnosis not present

## 2023-06-18 DIAGNOSIS — M879 Osteonecrosis, unspecified: Secondary | ICD-10-CM | POA: Diagnosis present

## 2023-06-18 DIAGNOSIS — M869 Osteomyelitis, unspecified: Secondary | ICD-10-CM | POA: Diagnosis present

## 2023-06-18 DIAGNOSIS — I10 Essential (primary) hypertension: Secondary | ICD-10-CM | POA: Diagnosis present

## 2023-06-18 DIAGNOSIS — Z79899 Other long term (current) drug therapy: Secondary | ICD-10-CM | POA: Diagnosis not present

## 2023-06-18 DIAGNOSIS — Z9641 Presence of insulin pump (external) (internal): Secondary | ICD-10-CM | POA: Diagnosis present

## 2023-06-18 DIAGNOSIS — Z87440 Personal history of urinary (tract) infections: Secondary | ICD-10-CM | POA: Diagnosis not present

## 2023-06-18 DIAGNOSIS — E1069 Type 1 diabetes mellitus with other specified complication: Principal | ICD-10-CM | POA: Diagnosis present

## 2023-06-18 DIAGNOSIS — Z793 Long term (current) use of hormonal contraceptives: Secondary | ICD-10-CM | POA: Diagnosis not present

## 2023-06-18 DIAGNOSIS — Z89412 Acquired absence of left great toe: Secondary | ICD-10-CM

## 2023-06-18 DIAGNOSIS — D631 Anemia in chronic kidney disease: Secondary | ICD-10-CM | POA: Diagnosis present

## 2023-06-18 DIAGNOSIS — Z833 Family history of diabetes mellitus: Secondary | ICD-10-CM

## 2023-06-18 DIAGNOSIS — I120 Hypertensive chronic kidney disease with stage 5 chronic kidney disease or end stage renal disease: Secondary | ICD-10-CM | POA: Diagnosis present

## 2023-06-18 DIAGNOSIS — L089 Local infection of the skin and subcutaneous tissue, unspecified: Secondary | ICD-10-CM | POA: Diagnosis not present

## 2023-06-18 DIAGNOSIS — E11628 Type 2 diabetes mellitus with other skin complications: Secondary | ICD-10-CM | POA: Diagnosis present

## 2023-06-18 DIAGNOSIS — L02612 Cutaneous abscess of left foot: Secondary | ICD-10-CM | POA: Diagnosis present

## 2023-06-18 DIAGNOSIS — N186 End stage renal disease: Secondary | ICD-10-CM

## 2023-06-18 DIAGNOSIS — N390 Urinary tract infection, site not specified: Secondary | ICD-10-CM | POA: Diagnosis present

## 2023-06-18 DIAGNOSIS — Z794 Long term (current) use of insulin: Secondary | ICD-10-CM

## 2023-06-18 DIAGNOSIS — E1022 Type 1 diabetes mellitus with diabetic chronic kidney disease: Secondary | ICD-10-CM | POA: Diagnosis present

## 2023-06-18 DIAGNOSIS — I5189 Other ill-defined heart diseases: Secondary | ICD-10-CM

## 2023-06-18 LAB — COMPREHENSIVE METABOLIC PANEL
ALT: 13 U/L (ref 0–44)
AST: 17 U/L (ref 15–41)
Albumin: 3.3 g/dL — ABNORMAL LOW (ref 3.5–5.0)
Alkaline Phosphatase: 55 U/L (ref 38–126)
Anion gap: 15 (ref 5–15)
BUN: 63 mg/dL — ABNORMAL HIGH (ref 6–20)
CO2: 23 mmol/L (ref 22–32)
Calcium: 8 mg/dL — ABNORMAL LOW (ref 8.9–10.3)
Chloride: 95 mmol/L — ABNORMAL LOW (ref 98–111)
Creatinine, Ser: 9.58 mg/dL — ABNORMAL HIGH (ref 0.44–1.00)
GFR, Estimated: 5 mL/min — ABNORMAL LOW (ref >60)
Glucose, Bld: 93 mg/dL (ref 70–99)
Potassium: 4.2 mmol/L (ref 3.5–5.1)
Sodium: 133 mmol/L — ABNORMAL LOW (ref 135–145)
Total Bilirubin: 0.6 mg/dL (ref 0.3–1.2)
Total Protein: 7.7 g/dL (ref 6.5–8.1)

## 2023-06-18 LAB — CBC
HCT: 30.5 % — ABNORMAL LOW (ref 36.0–46.0)
Hemoglobin: 10.2 g/dL — ABNORMAL LOW (ref 12.0–15.0)
MCH: 29.9 pg (ref 26.0–34.0)
MCHC: 33.4 g/dL (ref 30.0–36.0)
MCV: 89.4 fL (ref 80.0–100.0)
Platelets: 308 10*3/uL (ref 150–400)
RBC: 3.41 MIL/uL — ABNORMAL LOW (ref 3.87–5.11)
RDW: 12.4 % (ref 11.5–15.5)
WBC: 9.3 10*3/uL (ref 4.0–10.5)
nRBC: 0 % (ref 0.0–0.2)

## 2023-06-18 LAB — SEDIMENTATION RATE: Sed Rate: 118 mm/h — ABNORMAL HIGH (ref 0–20)

## 2023-06-18 LAB — GLUCOSE, CAPILLARY
Glucose-Capillary: 142 mg/dL — ABNORMAL HIGH (ref 70–99)
Glucose-Capillary: 374 mg/dL — ABNORMAL HIGH (ref 70–99)
Glucose-Capillary: 417 mg/dL — ABNORMAL HIGH (ref 70–99)

## 2023-06-18 LAB — HEPATITIS B SURFACE ANTIGEN: Hepatitis B Surface Ag: NONREACTIVE

## 2023-06-18 LAB — HIV ANTIBODY (ROUTINE TESTING W REFLEX): HIV Screen 4th Generation wRfx: NONREACTIVE

## 2023-06-18 LAB — HEMOGLOBIN A1C
Hgb A1c MFr Bld: 8.5 % — ABNORMAL HIGH (ref 4.8–5.6)
Mean Plasma Glucose: 197.25 mg/dL

## 2023-06-18 LAB — C-REACTIVE PROTEIN: CRP: 12.9 mg/dL — ABNORMAL HIGH (ref <1.0)

## 2023-06-18 LAB — PREALBUMIN: Prealbumin: 20 mg/dL (ref 18–38)

## 2023-06-18 MED ORDER — LIDOCAINE HCL (PF) 1 % IJ SOLN: 5.0000 mL | INTRAMUSCULAR | Status: DC | PRN

## 2023-06-18 MED ORDER — ONDANSETRON HCL 4 MG/2ML IJ SOLN
4.0000 mg | Freq: Once | INTRAMUSCULAR | Status: AC
  Administered 2023-06-18: 4 mg via INTRAVENOUS
  Filled 2023-06-18: qty 2

## 2023-06-18 MED ORDER — ONDANSETRON HCL 4 MG/2ML IJ SOLN
4.0000 mg | Freq: Four times a day (QID) | INTRAMUSCULAR | Status: DC | PRN
  Administered 2023-06-18 – 2023-06-21 (×7): 4 mg via INTRAVENOUS
  Filled 2023-06-18 (×7): qty 2

## 2023-06-18 MED ORDER — VANCOMYCIN HCL 1500 MG/300ML IV SOLN
1500.0000 mg | Freq: Once | INTRAVENOUS | Status: AC
  Administered 2023-06-18: 1500 mg via INTRAVENOUS
  Filled 2023-06-18: qty 300

## 2023-06-18 MED ORDER — RENA-VITE PO TABS
1.0000 | ORAL_TABLET | Freq: Every day | ORAL | Status: DC
  Administered 2023-06-18 – 2023-06-20 (×3): 1 via ORAL
  Filled 2023-06-18 (×3): qty 1

## 2023-06-18 MED ORDER — JUVEN PO PACK
1.0000 | PACK | Freq: Two times a day (BID) | ORAL | Status: DC
  Administered 2023-06-20 (×2): 1 via ORAL

## 2023-06-18 MED ORDER — ALTEPLASE 2 MG IJ SOLR: 2.0000 mg | Freq: Once | INTRAMUSCULAR | Status: DC | PRN

## 2023-06-18 MED ORDER — HEPARIN SODIUM (PORCINE) 1000 UNIT/ML DIALYSIS: 1000.0000 [IU] | INTRAMUSCULAR | Status: DC | PRN

## 2023-06-18 MED ORDER — PIPERACILLIN-TAZOBACTAM IN DEX 2-0.25 GM/50ML IV SOLN
2.2500 g | Freq: Once | INTRAVENOUS | Status: AC
  Administered 2023-06-18: 2.25 g via INTRAVENOUS
  Filled 2023-06-18: qty 50

## 2023-06-18 MED ORDER — LIDOCAINE-PRILOCAINE 2.5-2.5 % EX CREA: 1.0000 | TOPICAL_CREAM | CUTANEOUS | Status: DC | PRN

## 2023-06-18 MED ORDER — MORPHINE SULFATE (PF) 4 MG/ML IV SOLN
4.0000 mg | INTRAVENOUS | Status: DC | PRN
  Administered 2023-06-18: 4 mg via INTRAVENOUS
  Filled 2023-06-18: qty 1

## 2023-06-18 MED ORDER — INSULIN ASPART 100 UNIT/ML IJ SOLN: 0.0000 [IU] | Freq: Three times a day (TID) | INTRAMUSCULAR | Status: DC

## 2023-06-18 MED ORDER — HYDROMORPHONE HCL 1 MG/ML IJ SOLN
1.0000 mg | INTRAMUSCULAR | Status: DC | PRN
  Administered 2023-06-18 – 2023-06-21 (×20): 1 mg via INTRAVENOUS
  Filled 2023-06-18 (×19): qty 1

## 2023-06-18 MED ORDER — INSULIN ASPART 100 UNIT/ML IJ SOLN
0.0000 [IU] | Freq: Three times a day (TID) | INTRAMUSCULAR | Status: DC
  Administered 2023-06-18: 6 [IU] via SUBCUTANEOUS
  Administered 2023-06-19: 2 [IU] via SUBCUTANEOUS
  Administered 2023-06-19: 4 [IU] via SUBCUTANEOUS
  Administered 2023-06-20: 5 [IU] via SUBCUTANEOUS
  Administered 2023-06-20: 2 [IU] via SUBCUTANEOUS
  Administered 2023-06-20: 1 [IU] via SUBCUTANEOUS
  Administered 2023-06-20: 6 [IU] via SUBCUTANEOUS
  Filled 2023-06-18 (×6): qty 1

## 2023-06-18 MED ORDER — METRONIDAZOLE 500 MG/100ML IV SOLN
500.0000 mg | Freq: Two times a day (BID) | INTRAVENOUS | Status: DC
  Administered 2023-06-18 – 2023-06-20 (×5): 500 mg via INTRAVENOUS
  Filled 2023-06-18 (×8): qty 100

## 2023-06-18 MED ORDER — SODIUM CHLORIDE 0.9 % IV SOLN
250.0000 mL | INTRAVENOUS | Status: DC | PRN
  Administered 2023-06-18: 250 mL via INTRAVENOUS

## 2023-06-18 MED ORDER — ONDANSETRON HCL 4 MG PO TABS: 4.0000 mg | ORAL_TABLET | Freq: Four times a day (QID) | ORAL | Status: DC | PRN

## 2023-06-18 MED ORDER — SODIUM CHLORIDE 0.9% FLUSH
3.0000 mL | Freq: Two times a day (BID) | INTRAVENOUS | Status: DC
  Administered 2023-06-18 – 2023-06-21 (×6): 3 mL via INTRAVENOUS

## 2023-06-18 MED ORDER — PIPERACILLIN-TAZOBACTAM 3.375 G IVPB 30 MIN: 3.3750 g | Freq: Once | INTRAVENOUS | Status: DC

## 2023-06-18 MED ORDER — HEPARIN SODIUM (PORCINE) 5000 UNIT/ML IJ SOLN
5000.0000 [IU] | Freq: Three times a day (TID) | INTRAMUSCULAR | Status: DC
  Administered 2023-06-19 – 2023-06-21 (×3): 5000 [IU] via SUBCUTANEOUS
  Filled 2023-06-18 (×4): qty 1

## 2023-06-18 MED ORDER — PENTAFLUOROPROP-TETRAFLUOROETH EX AERO: 1.0000 | INHALATION_SPRAY | CUTANEOUS | Status: DC | PRN

## 2023-06-18 MED ORDER — ANTICOAGULANT SODIUM CITRATE 4% (200MG/5ML) IV SOLN: 5.0000 mL | Status: DC | PRN

## 2023-06-18 MED ORDER — CHLORHEXIDINE GLUCONATE CLOTH 2 % EX PADS
6.0000 | MEDICATED_PAD | Freq: Every day | CUTANEOUS | Status: DC
  Administered 2023-06-19: 6 via TOPICAL

## 2023-06-18 MED ORDER — SODIUM CHLORIDE 0.9% FLUSH: 3.0000 mL | INTRAVENOUS | Status: DC | PRN

## 2023-06-18 MED ORDER — SODIUM CHLORIDE 0.9 % IV SOLN
2.0000 g | INTRAVENOUS | Status: DC
  Administered 2023-06-18 – 2023-06-20 (×3): 2 g via INTRAVENOUS
  Filled 2023-06-18 (×4): qty 20

## 2023-06-18 NOTE — ED Provider Notes (Signed)
[facility] Provider Note    Event Date/Time   First MD Initiated Contact with Patient 06/18/23 1308     (approximate)   History   Foot Swelling   HPI  Emma Thomas is a 27 y.o. female with ESRD as well as type 1 diabetes presents to the ER for evaluation from podiatry clinic due to concern for osteomyelitis cellulitis and diabetic foot wound.  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  No measured fevers or chills.  She went to podiatry clinic and had significant amount of purulent drainage from previous amputation site was sent to the ER for admission for IV antibiotics, MRI and probable operative debridement tomorrow.  He rates her pain as mild to moderate in severity.     Physical Exam   Triage Vital Signs: ED Triage Vitals  Encounter Vitals Group     BP 06/18/23 1226 121/77     Systolic BP Percentile --      Diastolic BP Percentile --      Pulse Rate 06/18/23 1226 97     Resp 06/18/23 1226 16     Temp 06/18/23 1226 98.8 F (37.1 C)     Temp Source 06/18/23 1224 Oral     SpO2 06/18/23 1226 97 %     Weight 06/18/23 1227 150 lb (68 kg)     Height 06/18/23 1227 5\' 6"  (1.676 m)     Head Circumference --      Peak Flow --      Pain Score 06/18/23 1227 8     Pain Loc --      Pain Education --      Exclude from Growth Chart --     Most recent vital signs: Vitals:   06/18/23 1226  BP: 121/77  Pulse: 97  Resp: 16  Temp: 98.8 F (37.1 C)  SpO2: 97%     Constitutional: Alert  Eyes: Conjunctivae are normal.  Head: Atraumatic. Nose: No congestion/rhinnorhea. Mouth/Throat: Mucous membranes are moist.   Neck: Painless ROM.  Cardiovascular:   Good peripheral circulation. Respiratory: Normal respiratory effort.  No retractions.  Gastrointestinal: Soft and nontender.  Musculoskeletal: Fair amount of erythema warmth and tenderness to the site of previous  hallux amputation site. Skin:  Skin is warm, dry and intact. No rash noted. Psychiatric: Mood and affect are normal. Speech and behavior are normal.    ED Results / Procedures / Treatments   Labs (all labs ordered are listed, but only abnormal results are displayed) Labs Reviewed  CBC - Abnormal; Notable for the following components:      Result Value   RBC 3.41 (*)    Hemoglobin 10.2 (*)    HCT 30.5 (*)    All other components within normal limits  COMPREHENSIVE METABOLIC PANEL - Abnormal; Notable for the following components:   Sodium 133 (*)    Chloride 95 (*)    BUN 63 (*)    Creatinine, Ser 9.58 (*)    Calcium 8.0 (*)    Albumin 3.3 (*)    GFR, Estimated 5 (*)    All other components within normal limits     EKG     RADIOLOGY Please see ED Course for my review and interpretation.  I personally reviewed all radiographic images ordered to evaluate for the above acute complaints and reviewed radiology reports and findings.  These findings were personally discussed  with the patient.  Please see medical record for radiology report.    PROCEDURES:  Critical Care performed: No  Procedures   MEDICATIONS ORDERED IN ED: Medications  piperacillin-tazobactam (ZOSYN) IVPB 3.375 g (has no administration in time range)  morphine (PF) 4 MG/ML injection 4 mg (has no administration in time range)  ondansetron (ZOFRAN) injection 4 mg (has no administration in time range)     IMPRESSION / MDM / ASSESSMENT AND PLAN / ED COURSE  I reviewed the triage vital signs and the nursing notes.                              Differential diagnosis includes, but is not limited to, cellulitis, abscess, osteo-, fracture, ischemia  Patient presenting to the ER for evaluation of symptoms as described above.  Based on symptoms, risk factors and considered above differential, this presenting complaint could reflect a potentially life-threatening illness therefore the patient will be placed  on continuous pulse oximetry and telemetry for monitoring.  Laboratory evaluation will be sent to evaluate for the above complaints.  Patient with evidence of diabetic foot in setting of ESRD previous amputations for osteomyelitis presenting for symptoms as described above.  She will require IV antibiotics.  I discussed case in consultation with Dr. Excell Seltzer of podiatry who does recommend patient be kept n.p.o. at midnight.  Will order MRI per recommendations.  Will consult hospitalist for admission.  Will give IV morphine for pain.       FINAL CLINICAL IMPRESSION(S) / ED DIAGNOSES   Final diagnoses:  Cellulitis of foot associated with diabetes mellitus (HCC)     Rx / DC Orders   ED Discharge Orders     None        Note:  This document was prepared using Dragon voice recognition software and may include unintentional dictation errors.    Willy Eddy, MD 06/18/23 1322

## 2023-06-18 NOTE — H&P (Addendum)
History and Physical    Patient: Emma Thomas EXB:[id number] DOB: 08/18/1996 DOA: 06/18/2023 DOS: the patient was seen and examined on 06/18/2023 PCP: [name]  Patient coming from: Home  Chief Complaint:  Chief Complaint  Patient presents with   Foot Swelling   HPI: Emma Thomas is a 27 y.o. female with medical history significant of type 1 diabetes with insulin pump in place, end-stage renal disease on hemodialysis at home daily, hypertension, anemia of chronic disease, history of diabetic foot infection status post amputations of the great toes bilaterally presenting with diabetic foot infection.  Patient noted to be followed by Dr. Excell Seltzer with podiatry outpatient.  Per report, patient with worsening redness pain and swelling.  Positive generalized malaise at home over multiple days.  Patient reports blood sugars have been poorly controlled at home.  No fevers or chills.  No nausea or vomiting.  No chest pain or shortness of breath.  No abdominal pain.  Was evaluated at Dr. Bernette Redbird office today and the area of the left foot at the base of the first and second digits which have been previously amputated.  Worsening redness and pain with concern for infection.  Patient redirected to the ER for imaging and likely operative evaluation. Presented to the ER afebrile, hemodynamically stable.  White count 9.3, hemoglobin 10.2, creatinine 9.6.  Pending MRI of the left foot. Review of Systems: As mentioned in the history of present illness. All other systems reviewed and are negative. Past Medical History:  Diagnosis Date   Anemia    Diabetes mellitus without complication (HCC)    Type 1 DM   Essential hypertension    Headache    Hypertension 03/04/2013   Neurologic disorder    Both feet   Neuromuscular disorder (HCC)    Recurrent UTI    Renal disorder    Past Surgical History:  Procedure Laterality Date   abscess removal     excision of bartholin cyst   AMPUTATION  Left 11/23/2022   Procedure: LEFT GREAT TOE PARTIAL RAY AMPUTATION;  Surgeon: Linus Galas, DPM;  Location: [facility];  Service: Podiatry;  Laterality: Left;   CAPD INSERTION N/A 06/09/2022   Procedure: LAPAROSCOPIC INSERTION CONTINUOUS AMBULATORY PERITONEAL DIALYSIS  (CAPD) CATHETER, PD rep to be present;  Surgeon: Henrene Dodge, MD;  Location: [facility];  Service: General;  Laterality: N/A;   CAPD INSERTION N/A 12/15/2022   Procedure: LAPAROSCOPIC INSERTION CONTINUOUS AMBULATORY PERITONEAL DIALYSIS  (CAPD) CATHETER;  Surgeon: Henrene Dodge, MD;  Location: [facility];  Service: General;  Laterality: N/A;   CAPD REMOVAL N/A 12/15/2022   Procedure: LAPAROSCOPIC REMOVAL CONTINUOUS AMBULATORY PERITONEAL DIALYSIS  (CAPD) CATHETER, removal of old catheter;  Surgeon: Henrene Dodge, MD;  Location: [facility];  Service: General;  Laterality: N/A;   CAPD REMOVAL N/A 01/08/2023   Procedure: LAPAROSCOPIC REMOVAL CONTINUOUS AMBULATORY PERITONEAL DIALYSIS  (CAPD) CATHETER;  Surgeon: Henrene Dodge, MD;  Location: [facility];  Service: General;  Laterality: N/A;   CAPD REVISION N/A 07/16/2022   Procedure: LAPAROSCOPIC REVISION CONTINUOUS AMBULATORY PERITONEAL DIALYSIS  (CAPD) CATHETER;  Surgeon: Henrene Dodge, MD;  Location: [facility];  Service: General;  Laterality: N/A;   CAPD REVISION N/A 11/24/2022   Procedure: LAPAROSCOPIC REVISION CONTINUOUS AMBULATORY PERITONEAL DIALYSIS  (CAPD) CATHETER;  Surgeon: Henrene Dodge, MD;  Location: [facility];  Service: General;  Laterality: N/A;   DIALYSIS/PERMA CATHETER INSERTION N/A 04/24/2022   Procedure: DIALYSIS/PERMA CATHETER INSERTION;  Surgeon: Renford Dills, MD;  Location: [facility];  Service: Cardiovascular;  Laterality: N/A;   DIALYSIS/PERMA CATHETER REMOVAL N/A 06/02/2022   Procedure: DIALYSIS/PERMA CATHETER REMOVAL;  Surgeon: Renford Dills, MD;  Location: [facility];  Service: Cardiovascular;  Laterality: N/A;   DIALYSIS/PERMA CATHETER REMOVAL N/A  12/28/2022   Procedure: DIALYSIS/PERMA CATHETER REMOVAL;  Surgeon: Annice Needy, MD;  Location: [facility];  Service: Cardiovascular;  Laterality: N/A;   INSERTION OF DIALYSIS CATHETER Right    ARM   IR FLUORO GUIDE CV LINE RIGHT  11/27/2022   IR US GUIDE VASC ACCESS RIGHT  11/27/2022   Nexplanon  01/2011   UMBILICAL HERNIA REPAIR N/A 06/09/2022   Procedure: HERNIA REPAIR UMBILICAL ADULT;  Surgeon: Henrene Dodge, MD;  Location: [facility];  Service: General;  Laterality: N/A;   Social History:  reports that she has never smoked. She has been exposed to tobacco smoke. She has never used smokeless tobacco. She reports that she does not currently use alcohol after a past usage of about 2.0 standard drinks of alcohol per week. She reports that she does not use drugs.  No Known Allergies  Family History  Problem Relation Age of Onset   Breast cancer Mother 79   Lung cancer Maternal Grandmother    Diabetes type II Paternal Grandmother     Prior to Admission medications   Medication Sig Start Date End Date Taking? Authorizing Provider  acetaminophen (TYLENOL) 500 MG tablet Take 2 tablets (1,000 mg total) by mouth every 6 (six) hours as needed for mild pain. 01/08/23   Henrene Dodge, MD  amLODipine (NORVASC) 10 MG tablet Take 1 tablet (10 mg total) by mouth daily. 04/28/22   Lurene Shadow, MD  atorvastatin (LIPITOR) 80 MG tablet Take 80 mg by mouth daily. 01/27/23   [provider]  bumetanide (BUMEX) 1 MG tablet Take 1 mg by mouth daily. 07/13/22   [provider]  cholecalciferol (VITAMIN D3) 25 MCG (1000 UNIT) tablet Take 1,000 Units by mouth daily.    [provider]  ferrous gluconate (FERGON) 324 MG tablet Take 324 mg by mouth daily with breakfast. 05/03/20   [provider]  furosemide (LASIX) 80 MG tablet Take 80 mg by mouth daily. 08/10/22   [provider]  HUMALOG 100 UNIT/ML injection Inject 0-1 mLs (0-100 Units total) into the skin daily.  Uses with Insulin Pump 04/09/23   Kathlen Mody, MD  JUNEL 1/20 1-20 MG-MCG tablet Take 1 tablet by mouth daily. 04/17/22   [provider]  losartan (COZAAR) 100 MG tablet Take 100 mg by mouth daily. 06/04/22   [provider]  montelukast (SINGULAIR) 10 MG tablet Take 10 mg by mouth daily. 04/19/22   [provider]  potassium chloride (KLOR-CON M) 10 MEQ tablet Take 1 tablet (10 mEq total) by mouth 2 (two) times daily for 7 days. 04/08/23 04/22/23  Kathlen Mody, MD  pregabalin (LYRICA) 25 MG capsule Take 1 capsule (25 mg total) by mouth 2 (two) times daily. 04/22/23   Edward Jolly, MD  SUMAtriptan (IMITREX) 50 MG tablet Take 50 mg by mouth daily as needed for migraine. 11/11/21   [provider]    Physical Exam: Vitals:   06/18/23 1224 06/18/23 1226 06/18/23 1227  BP:  121/77   Pulse:  97   Resp:  16   Temp:  98.8 F (37.1 C)   TempSrc: Oral Oral   SpO2:  97%   Weight:   68 kg  Height:   5'  6" (1.676 m)   Physical Exam Constitutional:      Appearance: She is normal weight.  HENT:     Head: Normocephalic.     Nose: Nose normal.     Mouth/Throat:     Mouth: Mucous membranes are moist.  Eyes:     Pupils: Pupils are equal, round, and reactive to light.  Cardiovascular:     Rate and Rhythm: Normal rate and regular rhythm.  Pulmonary:     Effort: Pulmonary effort is normal.  Abdominal:     General: Bowel sounds are normal.  Musculoskeletal:        General: Normal range of motion.  Skin:    Comments: See picture  Neurological:     General: No focal deficit present.  Psychiatric:        Mood and Affect: Mood normal.       Data Reviewed:  There are no new results to review at this time.  Lab Results  Component Value Date   WBC 9.3 06/18/2023   HGB 10.2 (L) 06/18/2023   HCT 30.5 (L) 06/18/2023   MCV 89.4 06/18/2023   PLT 308 06/18/2023   Last metabolic panel Lab Results  Component Value Date   GLUCOSE 93 06/18/2023   NA 133 (L)  06/18/2023   K 4.2 06/18/2023   CL 95 (L) 06/18/2023   CO2 23 06/18/2023   BUN 63 (H) 06/18/2023   CREATININE 9.58 (H) 06/18/2023   GFRNONAA 5 (L) 06/18/2023   CALCIUM 8.0 (L) 06/18/2023   PHOS 4.4 11/26/2022   PROT 7.7 06/18/2023   ALBUMIN 3.3 (L) 06/18/2023   LABGLOB 3.0 10/25/2019   AGRATIO 1.0 (L) 10/25/2019   BILITOT 0.6 06/18/2023   ALKPHOS 55 06/18/2023   AST 17 06/18/2023   ALT 13 06/18/2023   ANIONGAP 15 06/18/2023    Assessment and Plan: * Diabetic foot infection (HCC) Worsening right foot redness and swelling in area of prior left first and second toe amputation Sent over from podiatry clinic Pending MRI of the foot per podiatry recommendations Will place on broad-spectrum antibiotics including Rocephin Flagyl and vancomycin Blood cultures drawn Follow up podiatry recommendations    CKD (chronic kidney disease) stage V requiring chronic dialysis (HCC) Baseline ESRD on home dialysis 4 times a week Creatinine 9.6 today Due for hemodialysis Dr. Cherylann Ratel contacted Monitor  Diastolic dysfunction 2D echo 08/05/2022 with EF 55 to 60% and grade 1 diastolic dysfunction Monitor volume status in the setting ESRD Pending dialysis today Appears fairly euvolemic Follow  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 (HCC) SSI A1c  Hypertension BP stable  Titrate home regimen     Greater than 50% was spent in counseling and coordination of care with patient Total encounter time 80 minutes or more    Advance Care Planning:   Code Status: Full Code   Consults: Podiatry- Dr. Excell Seltzer   Family Communication: No family at the bedside   Severity of Illness: The appropriate patient status for this patient is INPATIENT. Inpatient status is judged to be reasonable and necessary in order to provide the required intensity of service to ensure the patient's safety. The patient's presenting symptoms, physical exam findings, and initial radiographic and  laboratory data in the context of their chronic comorbidities is felt to place them at high risk for further clinical deterioration. Furthermore, it is not anticipated that the patient will be medically stable for discharge from the hospital within 2 midnights of admission.   *  I certify that at the point of admission it is my clinical judgment that the patient will require inpatient hospital care spanning beyond 2 midnights from the point of admission due to high intensity of service, high risk for further deterioration and high frequency of surveillance required.*  Author: Floydene Flock, MD 06/18/2023 2:37 PM  For on call review [email or web address].

## 2023-06-18 NOTE — Assessment & Plan Note (Addendum)
Worsening right foot redness and swelling in area of prior left first and second toe amputation Sent over from podiatry clinic Pending MRI of the foot per podiatry recommendations Will place on broad-spectrum antibiotics including Rocephin Flagyl and vancomycin Blood cultures drawn Follow up podiatry recommendations

## 2023-06-18 NOTE — Assessment & Plan Note (Signed)
BP stable Titrate home regimen 

## 2023-06-18 NOTE — Plan of Care (Signed)

## 2023-06-18 NOTE — Consult Note (Signed)
PODIATRY / FOOT AND ANKLE SURGERY CONSULTATION NOTE  Requesting Physician: Dr. Alvester Morin  Reason for consult: Left foot infection   HPI: Emma Thomas is a 27 y.o. female who presents with an abscess and possible osteomyelitis to the left 1st ray.  She came to clinic today and was noted to have fluctuance about her previous partial 1st ray amp.  An I&D was performed in clinic releasing a large amount of purulence.  Patient also was noted to have cellulitis at that time.  She was sent to the ED for admission to the hospital for MRI and likely surgery.  She presents resting in bed comfortably but still has some pain to the left foot.  PMHx:  Past Medical History:  Diagnosis Date   Anemia    Diabetes mellitus without complication (HCC)    Type 1 DM   Essential hypertension    Headache    Hypertension 03/04/2013   Neurologic disorder    Both feet   Neuromuscular disorder (HCC)    Recurrent UTI    Renal disorder     Surgical Hx:  Past Surgical History:  Procedure Laterality Date   abscess removal     excision of bartholin cyst   AMPUTATION Left 11/23/2022   Procedure: LEFT GREAT TOE PARTIAL RAY AMPUTATION;  Surgeon: Linus Galas, DPM;  Location: [facility];  Service: Podiatry;  Laterality: Left;   CAPD INSERTION N/A 06/09/2022   Procedure: LAPAROSCOPIC INSERTION CONTINUOUS AMBULATORY PERITONEAL DIALYSIS  (CAPD) CATHETER, PD rep to be present;  Surgeon: Henrene Dodge, MD;  Location: [facility];  Service: General;  Laterality: N/A;   CAPD INSERTION N/A 12/15/2022   Procedure: LAPAROSCOPIC INSERTION CONTINUOUS AMBULATORY PERITONEAL DIALYSIS  (CAPD) CATHETER;  Surgeon: Henrene Dodge, MD;  Location: [facility];  Service: General;  Laterality: N/A;   CAPD REMOVAL N/A 12/15/2022   Procedure: LAPAROSCOPIC REMOVAL CONTINUOUS AMBULATORY PERITONEAL DIALYSIS  (CAPD) CATHETER, removal of old catheter;  Surgeon: Henrene Dodge, MD;  Location: [facility];  Service: General;  Laterality: N/A;   CAPD REMOVAL N/A  01/08/2023   Procedure: LAPAROSCOPIC REMOVAL CONTINUOUS AMBULATORY PERITONEAL DIALYSIS  (CAPD) CATHETER;  Surgeon: Henrene Dodge, MD;  Location: [facility];  Service: General;  Laterality: N/A;   CAPD REVISION N/A 07/16/2022   Procedure: LAPAROSCOPIC REVISION CONTINUOUS AMBULATORY PERITONEAL DIALYSIS  (CAPD) CATHETER;  Surgeon: Henrene Dodge, MD;  Location: [facility];  Service: General;  Laterality: N/A;   CAPD REVISION N/A 11/24/2022   Procedure: LAPAROSCOPIC REVISION CONTINUOUS AMBULATORY PERITONEAL DIALYSIS  (CAPD) CATHETER;  Surgeon: Henrene Dodge, MD;  Location: [facility];  Service: General;  Laterality: N/A;   DIALYSIS/PERMA CATHETER INSERTION N/A 04/24/2022   Procedure: DIALYSIS/PERMA CATHETER INSERTION;  Surgeon: Renford Dills, MD;  Location: [facility];  Service: Cardiovascular;  Laterality: N/A;   DIALYSIS/PERMA CATHETER REMOVAL N/A 06/02/2022   Procedure: DIALYSIS/PERMA CATHETER REMOVAL;  Surgeon: Renford Dills, MD;  Location: [facility];  Service: Cardiovascular;  Laterality: N/A;   DIALYSIS/PERMA CATHETER REMOVAL N/A 12/28/2022   Procedure: DIALYSIS/PERMA CATHETER REMOVAL;  Surgeon: Annice Needy, MD;  Location: [facility];  Service: Cardiovascular;  Laterality: N/A;   INSERTION OF DIALYSIS CATHETER Right    ARM   IR FLUORO GUIDE CV LINE RIGHT  11/27/2022   IR US GUIDE VASC ACCESS RIGHT  11/27/2022   Nexplanon  01/2011   UMBILICAL HERNIA REPAIR N/A 06/09/2022   Procedure: HERNIA REPAIR UMBILICAL ADULT;  Surgeon: Henrene Dodge, MD;  Location: [facility];  Service:  General;  Laterality: N/A;    FHx:  Family History  Problem Relation Age of Onset   Breast cancer Mother 49   Lung cancer Maternal Grandmother    Diabetes type II Paternal Grandmother     Social History:  reports that she has never smoked. She has been exposed to tobacco smoke. She has never used smokeless tobacco. She reports that she does not currently use alcohol after a past usage of  about 2.0 standard drinks of alcohol per week. She reports that she does not use drugs.  Allergies: No Known Allergies  Medications Prior to Admission  Medication Sig Dispense Refill   acetaminophen (TYLENOL) 500 MG tablet Take 2 tablets (1,000 mg total) by mouth every 6 (six) hours as needed for mild pain.     amLODipine (NORVASC) 10 MG tablet Take 1 tablet (10 mg total) by mouth daily. 30 tablet 0   atorvastatin (LIPITOR) 80 MG tablet Take 80 mg by mouth daily.     bumetanide (BUMEX) 1 MG tablet Take 1 mg by mouth 2 (two) times daily.     furosemide (LASIX) 80 MG tablet Take 80 mg by mouth daily.     HUMALOG 100 UNIT/ML injection Inject 0-1 mLs (0-100 Units total) into the skin daily. Uses with Insulin Pump 10 mL 11   losartan (COZAAR) 100 MG tablet Take 100 mg by mouth daily.     montelukast (SINGULAIR) 10 MG tablet Take 10 mg by mouth at bedtime.     norethindrone-ethinyl estradiol (LOESTRIN) 1-20 MG-MCG tablet Take 1 tablet by mouth daily.     ondansetron (ZOFRAN) 8 MG tablet Take 8 mg by mouth daily.     sevelamer carbonate (RENVELA) 800 MG tablet Take 800-1,600 mg by mouth See admin instructions. Take 2 tablets (1600mg ) by mouth three times daily with meals and take 1 tablet (800mg ) by mouth daily with snacks     SUMAtriptan (IMITREX) 50 MG tablet Take 50 mg by mouth daily as needed for migraine.     potassium chloride (KLOR-CON M) 10 MEQ tablet Take 1 tablet (10 mEq total) by mouth 2 (two) times daily for 7 days. 14 tablet 0   pregabalin (LYRICA) 25 MG capsule Take 1 capsule (25 mg total) by mouth 2 (two) times daily. (Patient not taking: Reported on 06/18/2023) 60 capsule 0    Physical Exam: General: Alert and oriented.  No apparent distress.  Vascular: DP/PT pulses palpable bil, CFT intact to digits both.  Neuro: Light touch sensation reduced to BLE  Derm: I&D site at the distal 1st ray area left foot with seropurulence drainage present, no odor, associated erythema and edema  present with further fluctuance.  MSK: L partial 1st ray amputation   Results for orders placed or performed during the hospital encounter of 06/18/23 (from the past 48 hour(s))  Comprehensive metabolic panel     Status: Abnormal   Collection Time: 06/18/23 12:31 PM  Result Value Ref Range   Sodium 133 (L) 135 - 145 mmol/L   Potassium 4.2 3.5 - 5.1 mmol/L   Chloride 95 (L) 98 - 111 mmol/L   CO2 23 22 - 32 mmol/L   Glucose, Bld 93 70 - 99 mg/dL    Comment: Glucose reference range applies only to samples taken after fasting for at least 8 hours.   BUN 63 (H) 6 - 20 mg/dL   Creatinine, Ser 4.09 (H) 0.44 - 1.00 mg/dL   Calcium 8.0 (L) 8.9 - 10.3 mg/dL   Total  Protein 7.7 6.5 - 8.1 g/dL   Albumin 3.3 (L) 3.5 - 5.0 g/dL   AST 17 15 - 41 U/L   ALT 13 0 - 44 U/L   Alkaline Phosphatase 55 38 - 126 U/L   Total Bilirubin 0.6 0.3 - 1.2 mg/dL   GFR, Estimated 5 (L) >60 mL/min    Comment: (NOTE) Calculated using the CKD-EPI Creatinine Equation (2021)    Anion gap 15 5 - 15    Comment: Performed at [facility], [address] [place], [place] [address]  CBC     Status: Abnormal   Collection Time: 06/18/23 12:35 PM  Result Value Ref Range   WBC 9.3 4.0 - 10.5 K/uL   RBC 3.41 (L) 3.87 - 5.11 MIL/uL   Hemoglobin 10.2 (L) 12.0 - 15.0 g/dL   HCT 60.4 (L) 54.0 - 98.1 %   MCV 89.4 80.0 - 100.0 fL   MCH 29.9 26.0 - 34.0 pg   MCHC 33.4 30.0 - 36.0 g/dL   RDW 19.1 47.8 - 29.5 %   Platelets 308 150 - 400 K/uL   nRBC 0.0 0.0 - 0.2 %    Comment: Performed at [facility], [address] [place], [place] [address]  Hemoglobin A1c     Status: Abnormal   Collection Time: 06/18/23 12:35 PM  Result Value Ref Range   Hgb A1c MFr Bld 8.5 (H) 4.8 - 5.6 %    Comment: (NOTE) Pre diabetes:          5.7%-6.4%  Diabetes:              >6.4%  Glycemic control for   <7.0% adults with diabetes    Mean Plasma Glucose 197.25 mg/dL    Comment: Performed at [facility], [address], [place], [place] [address]  HIV Antibody (routine testing w rflx)     Status: None   Collection Time: 06/18/23  1:34 PM  Result Value Ref Range   HIV Screen 4th Generation wRfx Non Reactive Non Reactive    Comment: Performed at [facility], [address], [place], [place] [address]  Sedimentation rate     Status: Abnormal   Collection Time: 06/18/23  1:34 PM  Result Value Ref Range   Sed Rate 118 (H) 0 - 20 mm/hr    Comment: Performed at [facility], [address] [place], [place] [address]  C-reactive protein     Status: Abnormal   Collection Time: 06/18/23  1:35 PM  Result Value Ref Range   CRP 12.9 (H) <1.0 mg/dL    Comment: Performed at [facility], [address], [place], [place] [address]  Prealbumin     Status: None   Collection Time: 06/18/23  1:35 PM  Result Value Ref Range   Prealbumin 20 18 - 38 mg/dL    Comment: Performed at [facility], [address] [place], [place] [address]  Hepatitis B surface antigen     Status: None   Collection Time: 06/18/23  3:11 PM  Result Value Ref Range   Hepatitis B Surface Ag NON REACTIVE NON REACTIVE    Comment: Performed at [facility], [address], [place], [place] [address]  Glucose, capillary     Status: Abnormal   Collection Time: 06/18/23  4:01 PM  Result Value Ref Range   Glucose-Capillary 142 (H) 70 - 99 mg/dL    Comment: Glucose reference range applies only  to samples taken after fasting for at least 8 hours.   MR FOOT LEFT WO CONTRAST  Result Date: 06/18/2023 CLINICAL DATA:  Left foot infection.  History of prior amputation. EXAM: MRI OF THE LEFT FOOT WITHOUT CONTRAST TECHNIQUE: Multiplanar, multisequence MR imaging of the left forefoot was performed. No intravenous contrast was administered. COMPARISON:  MRI left foot and left foot x-rays dated November 20, 2022. FINDINGS: Bones/Joint/Cartilage Prior first ray amputation. Abnormal marrow edema with corresponding decreased T1  marrow signal involving the distal residual first metatarsal. 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 No fracture or dislocation. No joint effusion. Ligaments Second through fifth toe collateral ligaments are intact. Lisfranc ligament is intact. Muscles and Tendons Second through fifth flexor and extensor tendons are intact. No tenosynovitis. Increased T2 signal within the intrinsic muscles of the forefoot, nonspecific, but likely related to diabetic muscle changes. Soft tissue 4.8 x 2.1 x 2.9 cm complex fluid collection at the first ray amputation site (series 10, image 14), with two sinus tracts draining to the skin surface (series 7, images 16 and 29). No soft tissue mass. IMPRESSION: 1. 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 2. 4.8 x 2.1 x 2.9 cm abscess at the first ray amputation site. 3. 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 Electronically Signed   By: Obie Dredge M.D.   On: 06/18/2023 15:08    Blood pressure (!) 141/84, pulse 94, temperature 98.4 F (36.9 C), temperature source Oral, resp. rate 20, height 5\' 6"  (1.676 m), weight 68 kg, SpO2 100%.  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  Shows osteomyelitis to the L 1st ray with abscess.  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  Will take more tomorrow at time of surgery All treatment options were discussed with the patient of both conservative and surgical attempts at correction including potential risks and complications.  Patient has elected for procedure consisting of left I&D with revision partial 1st ray amputation and possible Abx bead application.  No guarantees given.  Consent obtained. -NPO at midnight for surgery tomorrow. -Appreciate medicine recs for Abx management. -Surgery  tomorrow at 0730am.   Rosetta Posner, DPM 06/18/2023, 8:37 PM

## 2023-06-18 NOTE — ED Notes (Signed)
A full rainbow and 2 BC were collected at this time except for Lactic Acid.

## 2023-06-18 NOTE — Consult Note (Signed)
Pharmacy Antibiotic Note  ASSESSMENT: 27 y.o. female with PMH ESRD-HD (4x/week), left big toe amputation, T1DM  is presenting with  lower extremity wound infection . Pharmacy has been consulted to manage vancomycin dosing.  Patient measurements: Height: 5\' 6"  (167.6 cm) Weight: 68 kg (150 lb) IBW/kg (Calculated) : 59.3  Vital signs: Temp: 98.8 F (37.1 C) (08/02 1226) Temp Source: Oral (08/02 1226) BP: 121/77 (08/02 1226) Pulse Rate: 97 (08/02 1226) Recent Labs  Lab 06/18/23 1231 06/18/23 1235  WBC  --  9.3  CREATININE 9.58*  --    Estimated Creatinine Clearance: 8.2 mL/min (A) (by C-G formula based on SCr of 9.58 mg/dL (H)).  Allergies: No Known Allergies  Antimicrobials this admission: Zosyn 8/2 >>  Vancomycin 8/2 >>  Dose adjustments this admission: N/A  Microbiology results: 8/2 BCx: sent  PLAN: Patient loaded with vancomycin 1500 mg IV x 1 in ED Initiate vancomycin 750 mg IV x 1 after each HD session Patient undergoes HD at home and has no set day schedule, as long as she does 4x sessions a week Consider obtaining trough level before 3rd HD session Follow up culture results to assess for antibiotic optimization. Monitor renal function to assess for any necessary antibiotic dosing changes.   Thank you for allowing pharmacy to be a part of this patient's care.  Will M. Dareen Piano, PharmD Clinical Pharmacist 06/18/2023 2:36 PM

## 2023-06-18 NOTE — Consult Note (Signed)
PHARMACY - BRIEF ANTIBIOTIC NOTE   Pharmacy has received consult(s) for vancomycin from an ED provider. The patient's profile has been reviewed for ht/wt/allergies/indication/available labs.    One time order(s) placed for: vancomycin 1500 mg IV x 1  Further antibiotics/pharmacy consults should be ordered by admitting physician if indicated.                       Thank you,  Will M. Dareen Piano, PharmD Clinical Pharmacist 06/18/2023 1:40 PM

## 2023-06-18 NOTE — Assessment & Plan Note (Signed)
SSI A1c 

## 2023-06-18 NOTE — ED Triage Notes (Addendum)
Pt reports that she was having left foot swelling for the past 2 days and saw her podiatrist, dr Engineer, production who sent her to the ER to get an mri of her left foot due to the possibility of having a bone infection, pt reports that she had left great toe amputated in January, states that today a large amount of pus like substance came out of the area of concern  Pt reports that she does dialysis 4 times a week and uses the right arm

## 2023-06-18 NOTE — Progress Notes (Signed)
Initial Nutrition Assessment  DOCUMENTATION CODES:   Not applicable  INTERVENTION:   -Liberalize diet to carb modified for wider variety of meal selections -1 packet Juven BID, each packet provides 95 calories, 2.5 grams of protein (collagen), and 9.8 grams of carbohydrate (3 grams sugar); also contains 7 grams of L-arginine and L-glutamine, 300 mg vitamin C, 15 mg vitamin E, 1.2 mcg vitamin B-12, 9.5 mg zinc, 200 mg calcium, and 1.5 g  Calcium Beta-hydroxy-Beta-methylbutyrate to support wound healing  -Renal MVI daily  NUTRITION DIAGNOSIS:   Increased nutrient needs related to wound healing as evidenced by estimated needs.  GOAL:   Patient will meet greater than or equal to 90% of their needs  MONITOR:   PO intake, Supplement acceptance  REASON FOR ASSESSMENT:   Consult Wound healing  ASSESSMENT:   Pt with medical history significant of type 1 diabetes with insulin pump in place, end-stage renal disease on hemodialysis at home daily, hypertension, anemia of chronic disease, history of diabetic foot infection status post amputations of the great toes bilaterally presenting with diabetic foot infection  Pt admitted with DM foot infection.   Pt with rt foot redness as well as lt first and 2nd toe amputations. Pt is followed by podiatry at an outpatient.   Lunch tray just delivered prior to visit. Pt talking on the phone at time of visit, but would answer open ended questions. Pt reports fair appetite, generally consumes 2 meals per day. Pt did not provide diet recall, but eats "a little of everything". She does not take any vitamins or supplements at home.   Pt completes home HD 4 times per week PTA. Pt reports treatments are going fair and labs have been table other than recently elevated phosphorus levels.   Pt reports UBW is 150#. Denies any weight loss. Wt has been stable over the past 6 months.   Discussed importance of good meal and supplement intake to promote healing.    Lab Results  Component Value Date   HGBA1C 7.9 (H) 11/20/2022   PTA DM medications are 0-100 units insulin lispro daily via insulin pump.   Labs reviewed: Na: 133, CBGS: 142 (inpatient orders for glycemic control are 0-6 units insulin aspart TID with meals).    NUTRITION - FOCUSED PHYSICAL EXAM:  Flowsheet Row Most Recent Value  Orbital Region No depletion  Upper Arm Region No depletion  Thoracic and Lumbar Region No depletion  Buccal Region No depletion  Temple Region No depletion  Clavicle Bone Region No depletion  Clavicle and Acromion Bone Region No depletion  Scapular Bone Region No depletion  Dorsal Hand No depletion  Patellar Region No depletion  Anterior Thigh Region No depletion  Posterior Calf Region No depletion  Edema (RD Assessment) None  Hair Reviewed  Eyes Reviewed  Mouth Reviewed  Skin Reviewed  Nails Reviewed       Diet Order:   Diet Order             Diet renal/carb modified with fluid restriction Diet-HS Snack? Nothing; Fluid restriction: 1200 mL Fluid; Room service appropriate? Yes; Fluid consistency: Thin  Diet effective now                   EDUCATION NEEDS:   Education needs have been addressed  Skin:  Skin Assessment: Reviewed RN Assessment  Last BM:  Unknown  Height:   Ht Readings from Last 1 Encounters:  06/18/23 5\' 6"  (1.676 m)    Weight:   Wt  Readings from Last 1 Encounters:  06/18/23 68 kg    Ideal Body Weight:  59.1 kg  BMI:  Body mass index is 24.21 kg/m.  Estimated Nutritional Needs:   Kcal:  1850-2050  Protein:  100-115 grams  Fluid:  1000 ml + UOP    Levada Schilling, RD, LDN, CDCES Registered Dietitian II Certified Diabetes Care and Education Specialist Please refer to [facility] for RD and/or RD on-call/weekend/after hours pager

## 2023-06-18 NOTE — Assessment & Plan Note (Signed)
2D echo 08/05/2022 with EF 55 to 60% and grade 1 diastolic dysfunction Monitor volume status in the setting ESRD Pending dialysis today Appears fairly euvolemic Follow

## 2023-06-18 NOTE — Assessment & Plan Note (Addendum)
Baseline ESRD on home dialysis 4 times a week Creatinine 9.6 today Due for hemodialysis Dr. Cherylann Ratel contacted Monitor

## 2023-06-19 ENCOUNTER — Inpatient Hospital Stay: Payer: 59 | Admitting: Anesthesiology

## 2023-06-19 ENCOUNTER — Inpatient Hospital Stay: Payer: 59

## 2023-06-19 ENCOUNTER — Encounter
Admission: EM | Disposition: A | Discharge status: Home / Self Care | Payer: Self-pay | Source: Ambulatory Visit | Attending: Student

## 2023-06-19 DIAGNOSIS — L089 Local infection of the skin and subcutaneous tissue, unspecified: Secondary | ICD-10-CM | POA: Diagnosis not present

## 2023-06-19 DIAGNOSIS — E11628 Type 2 diabetes mellitus with other skin complications: Secondary | ICD-10-CM | POA: Diagnosis not present

## 2023-06-19 HISTORY — PX: IRRIGATION AND DEBRIDEMENT FOOT: SHX6602

## 2023-06-19 LAB — VITAMIN D 25 HYDROXY (VIT D DEFICIENCY, FRACTURES): Vit D, 25-Hydroxy: 20.67 ng/mL — ABNORMAL LOW (ref 30–100)

## 2023-06-19 LAB — GLUCOSE, CAPILLARY
Glucose-Capillary: 341 mg/dL — ABNORMAL HIGH (ref 70–99)
Glucose-Capillary: 339 mg/dL — ABNORMAL HIGH (ref 70–99)
Glucose-Capillary: 263 mg/dL — ABNORMAL HIGH (ref 70–99)
Glucose-Capillary: 218 mg/dL — ABNORMAL HIGH (ref 70–99)
Glucose-Capillary: 331 mg/dL — ABNORMAL HIGH (ref 70–99)
Glucose-Capillary: 462 mg/dL — ABNORMAL HIGH (ref 70–99)

## 2023-06-19 LAB — IRON AND TIBC
Iron: 82 ug/dL (ref 28–170)
Saturation Ratios: 29 % (ref 10.4–31.8)
TIBC: 281 ug/dL (ref 250–450)
UIBC: 199 ug/dL

## 2023-06-19 LAB — PREGNANCY, URINE: Preg Test, Ur: NEGATIVE

## 2023-06-19 LAB — FOLATE: Folate: 33 ng/mL (ref >5.9)

## 2023-06-19 LAB — VITAMIN B12: Vitamin B-12: 600 pg/mL (ref 180–914)

## 2023-06-19 SURGERY — IRRIGATION AND DEBRIDEMENT FOOT
Anesthesia: General | Laterality: Left

## 2023-06-19 MED ORDER — LIDOCAINE HCL (CARDIAC) PF 100 MG/5ML IV SOSY
PREFILLED_SYRINGE | INTRAVENOUS | Status: DC | PRN
  Administered 2023-06-19: 80 mg via INTRAVENOUS

## 2023-06-19 MED ORDER — INSULIN ASPART 100 UNIT/ML IJ SOLN
4.0000 [IU] | Freq: Once | INTRAMUSCULAR | Status: AC
  Administered 2023-06-19: 4 [IU] via SUBCUTANEOUS
  Filled 2023-06-19: qty 1

## 2023-06-19 MED ORDER — INSULIN REGULAR(HUMAN) IN NACL 100-0.9 UT/100ML-% IV SOLN
Freq: Once | INTRAVENOUS | Status: DC
Start: 2023-06-19 — End: 2023-06-19

## 2023-06-19 MED ORDER — AMLODIPINE BESYLATE 5 MG PO TABS
5.0000 mg | ORAL_TABLET | Freq: Every day | ORAL | Status: DC
  Administered 2023-06-19 – 2023-06-20 (×2): 5 mg via ORAL
  Filled 2023-06-19 (×2): qty 1

## 2023-06-19 MED ORDER — CEFAZOLIN SODIUM-DEXTROSE 2-4 GM/100ML-% IV SOLN
2.0000 g | INTRAVENOUS | Status: AC
  Administered 2023-06-19: 2 g via INTRAVENOUS
  Filled 2023-06-19: qty 100

## 2023-06-19 MED ORDER — POVIDONE-IODINE 7.5 % EX SOLN
Freq: Once | CUTANEOUS | Status: DC
  Filled 2023-06-19: qty 118

## 2023-06-19 MED ORDER — PROPOFOL 10 MG/ML IV BOLUS
INTRAVENOUS | Status: AC
  Filled 2023-06-19: qty 20

## 2023-06-19 MED ORDER — MIDAZOLAM HCL 2 MG/2ML IJ SOLN
INTRAMUSCULAR | Status: DC | PRN
  Administered 2023-06-19: 2 mg via INTRAVENOUS

## 2023-06-19 MED ORDER — FENTANYL CITRATE (PF) 100 MCG/2ML IJ SOLN
INTRAMUSCULAR | Status: DC | PRN
  Administered 2023-06-19 (×2): 25 ug via INTRAVENOUS
  Administered 2023-06-19: 50 ug via INTRAVENOUS

## 2023-06-19 MED ORDER — LIDOCAINE HCL (PF) 2 % IJ SOLN
INTRAMUSCULAR | Status: AC
  Filled 2023-06-19: qty 5

## 2023-06-19 MED ORDER — ONDANSETRON HCL 4 MG/2ML IJ SOLN
INTRAMUSCULAR | Status: DC | PRN
  Administered 2023-06-19: 4 mg via INTRAVENOUS

## 2023-06-19 MED ORDER — ONDANSETRON HCL 4 MG/2ML IJ SOLN
INTRAMUSCULAR | Status: AC
  Filled 2023-06-19: qty 2

## 2023-06-19 MED ORDER — 0.9 % SODIUM CHLORIDE (POUR BTL) OPTIME
TOPICAL | Status: DC | PRN
  Administered 2023-06-19: 500 mL

## 2023-06-19 MED ORDER — SODIUM CHLORIDE 0.9 % IV SOLN: INTRAVENOUS | Status: DC | PRN

## 2023-06-19 MED ORDER — DEXAMETHASONE SODIUM PHOSPHATE 10 MG/ML IJ SOLN
INTRAMUSCULAR | Status: AC
  Filled 2023-06-19: qty 1

## 2023-06-19 MED ORDER — FENTANYL CITRATE PF 50 MCG/ML IJ SOSY: 50.0000 ug | PREFILLED_SYRINGE | INTRAMUSCULAR | Status: DC | PRN

## 2023-06-19 MED ORDER — INSULIN ASPART 100 UNIT/ML IJ SOLN
INTRAMUSCULAR | Status: AC
  Filled 2023-06-19: qty 1

## 2023-06-19 MED ORDER — FENTANYL CITRATE (PF) 100 MCG/2ML IJ SOLN
INTRAMUSCULAR | Status: AC
  Filled 2023-06-19: qty 2

## 2023-06-19 MED ORDER — MIDAZOLAM HCL 2 MG/2ML IJ SOLN
INTRAMUSCULAR | Status: AC
  Filled 2023-06-19: qty 2

## 2023-06-19 MED ORDER — INSULIN ASPART 100 UNIT/ML IJ SOLN
10.0000 [IU] | Freq: Once | INTRAMUSCULAR | Status: AC
  Administered 2023-06-19: 10 [IU] via SUBCUTANEOUS
  Filled 2023-06-19: qty 1

## 2023-06-19 MED ORDER — PROPOFOL 10 MG/ML IV BOLUS
INTRAVENOUS | Status: DC | PRN
  Administered 2023-06-19: 180 mg via INTRAVENOUS
  Administered 2023-06-19: 20 mg via INTRAVENOUS

## 2023-06-19 MED ORDER — VANCOMYCIN VARIABLE DOSE PER UNSTABLE RENAL FUNCTION (PHARMACIST DOSING): Status: DC

## 2023-06-19 MED ORDER — PHENYLEPHRINE HCL (PRESSORS) 10 MG/ML IV SOLN
INTRAVENOUS | Status: DC | PRN
  Administered 2023-06-19: 80 ug via INTRAVENOUS
  Administered 2023-06-19 (×2): 160 ug via INTRAVENOUS

## 2023-06-19 MED ORDER — BUPIVACAINE HCL (PF) 0.5 % IJ SOLN
INTRAMUSCULAR | Status: DC | PRN
  Administered 2023-06-19: 20 mL

## 2023-06-19 MED ORDER — VANCOMYCIN HCL 750 MG/150ML IV SOLN
750.0000 mg | Freq: Once | INTRAVENOUS | Status: AC
  Administered 2023-06-19: 750 mg via INTRAVENOUS
  Filled 2023-06-19: qty 150

## 2023-06-19 MED ORDER — INSULIN ASPART 100 UNIT/ML IJ SOLN
5.0000 [IU] | Freq: Once | INTRAMUSCULAR | Status: AC
  Administered 2023-06-19: 5 [IU] via INTRAVENOUS

## 2023-06-19 MED ORDER — INSULIN GLARGINE-YFGN 100 UNIT/ML ~~LOC~~ SOLN
12.0000 [IU] | Freq: Every day | SUBCUTANEOUS | Status: DC
  Administered 2023-06-19: 12 [IU] via SUBCUTANEOUS
  Filled 2023-06-19: qty 0.12

## 2023-06-19 MED ORDER — DEXAMETHASONE SODIUM PHOSPHATE 10 MG/ML IJ SOLN
INTRAMUSCULAR | Status: DC | PRN
  Administered 2023-06-19: 5 mg via INTRAVENOUS

## 2023-06-19 MED ORDER — CEFAZOLIN SODIUM-DEXTROSE 2-4 GM/100ML-% IV SOLN
INTRAVENOUS | Status: AC
  Filled 2023-06-19: qty 100

## 2023-06-19 SURGICAL SUPPLY — 58 items
BAG COUNTER SPONGE SURGICOUNT (BAG) ×1 IMPLANT
BAG SPNG CNTER NS LX DISP (BAG) ×1
BLADE OSC/SAGITTAL MD 5.5X18 (BLADE) IMPLANT
BLADE OSCILLATING/SAGITTAL (BLADE)
BLADE SW THK.38XMED LNG THN (BLADE) IMPLANT
BNDG CMPR 75X21 PLY HI ABS (MISCELLANEOUS)
BNDG CMPR STD VLCR NS LF 5.8X3 (GAUZE/BANDAGES/DRESSINGS)
BNDG CMPR STD VLCR NS LF 5.8X4 (GAUZE/BANDAGES/DRESSINGS)
BNDG ELASTIC 3X5.8 VLCR NS LF (GAUZE/BANDAGES/DRESSINGS) IMPLANT
BNDG ELASTIC 4X5.8 VLCR NS LF (GAUZE/BANDAGES/DRESSINGS) IMPLANT
BNDG ESMARCH 4 X 12 STRL LF (GAUZE/BANDAGES/DRESSINGS) ×1
BNDG ESMARCH 4X12 STRL LF (GAUZE/BANDAGES/DRESSINGS) ×1 IMPLANT
BNDG GAUZE DERMACEA FLUFF 4 (GAUZE/BANDAGES/DRESSINGS) IMPLANT
BNDG GZE 12X3 1 PLY HI ABS (GAUZE/BANDAGES/DRESSINGS)
BNDG GZE DERMACEA 4 6PLY (GAUZE/BANDAGES/DRESSINGS)
BNDG STRETCH GAUZE 3IN X12FT (GAUZE/BANDAGES/DRESSINGS) IMPLANT
DURAPREP 26ML APPLICATOR (WOUND CARE) ×1 IMPLANT
ELECT REM PT RETURN 9FT ADLT (ELECTROSURGICAL) ×1
ELECTRODE REM PT RTRN 9FT ADLT (ELECTROSURGICAL) ×1 IMPLANT
GAUZE PACKING 0.25INX5YD STRL (GAUZE/BANDAGES/DRESSINGS) IMPLANT
GAUZE SPONGE 4X4 12PLY STRL (GAUZE/BANDAGES/DRESSINGS) ×1 IMPLANT
GAUZE STRETCH 2X75IN STRL (MISCELLANEOUS) IMPLANT
GAUZE XEROFORM 1X8 LF (GAUZE/BANDAGES/DRESSINGS) ×1 IMPLANT
GLOVE BIO SURGEON STRL SZ7 (GLOVE) ×1 IMPLANT
GLOVE INDICATOR 7.5 STRL GRN (GLOVE) ×1 IMPLANT
GOWN STRL REUS W/ TWL LRG LVL3 (GOWN DISPOSABLE) ×2 IMPLANT
GOWN STRL REUS W/TWL LRG LVL3 (GOWN DISPOSABLE) ×2
IV NS 1000ML (IV SOLUTION)
IV NS 1000ML BAXH (IV SOLUTION) IMPLANT
IV NS IRRIG 3000ML ARTHROMATIC (IV SOLUTION) IMPLANT
KIT STIMULAN RAPID CURE 5CC (Orthopedic Implant) IMPLANT
KIT TURNOVER KIT A (KITS) ×1 IMPLANT
LABEL OR SOLS (LABEL) IMPLANT
MANIFOLD NEPTUNE II (INSTRUMENTS) ×1 IMPLANT
NDL HYPO 25X1 1.5 SAFETY (NEEDLE) ×2 IMPLANT
NEEDLE HYPO 25X1 1.5 SAFETY (NEEDLE) ×2 IMPLANT
NS IRRIG 500ML POUR BTL (IV SOLUTION) ×1 IMPLANT
PACK EXTREMITY ARMC (MISCELLANEOUS) ×1 IMPLANT
PACKING GAUZE IODOFORM 1INX5YD (GAUZE/BANDAGES/DRESSINGS) IMPLANT
PAD ABD DERMACEA PRESS 5X9 (GAUZE/BANDAGES/DRESSINGS) IMPLANT
PULSAVAC PLUS IRRIG FAN TIP (DISPOSABLE)
RASP SM TEAR CROSS CUT (RASP) IMPLANT
SOL PREP PVP 2OZ (MISCELLANEOUS)
SOLUTION PREP PVP 2OZ (MISCELLANEOUS) IMPLANT
STOCKINETTE STRL 6IN 960660 (GAUZE/BANDAGES/DRESSINGS) ×1 IMPLANT
SUT ETHILON 3-0 FS-10 30 BLK (SUTURE)
SUT ETHILON 4-0 (SUTURE)
SUT ETHILON 4-0 FS2 18XMFL BLK (SUTURE)
SUT VIC AB 3-0 SH 27 (SUTURE)
SUT VIC AB 3-0 SH 27X BRD (SUTURE) IMPLANT
SUT VIC AB 4-0 FS2 27 (SUTURE) IMPLANT
SUTURE EHLN 3-0 FS-10 30 BLK (SUTURE) IMPLANT
SUTURE ETHLN 4-0 FS2 18XMF BLK (SUTURE) IMPLANT
SWAB CULTURE AMIES ANAERIB BLU (MISCELLANEOUS) IMPLANT
SYR 10ML LL (SYRINGE) ×1 IMPLANT
TIP FAN IRRIG PULSAVAC PLUS (DISPOSABLE) IMPLANT
TRAP FLUID SMOKE EVACUATOR (MISCELLANEOUS) ×1 IMPLANT
WATER STERILE IRR 500ML POUR (IV SOLUTION) ×1 IMPLANT

## 2023-06-19 NOTE — Plan of Care (Signed)

## 2023-06-19 NOTE — Plan of Care (Signed)

## 2023-06-19 NOTE — Progress Notes (Signed)
  Received patient in bed to unit.   Informed consent signed and in chart.    TX duration: 3 hours Ended 30 mins early d/t pt request d/t headache, Dr. Suezanne Jacquet aware.  Headache resolved before pt left dialysis suite.     Hand-off given to patient's nurse.    Access used: R UA AVF Access issues:  None   Total UF removed: 1500 mL Medication(s) given: None Post HD VS: 99.2 T, 148/93 BP, 100 P, 100% sPO2%, 15 RR Post HD weight: 72.6 kg     [facility]

## 2023-06-19 NOTE — Anesthesia Postprocedure Evaluation (Signed)
Anesthesia Post Note  Patient: Emma Thomas  Procedure(s) Performed: IRRIGATION AND DEBRIDEMENT FOOT PARTIAL 1ST RAY AMPUTATION (Left)  Patient location during evaluation: PACU Anesthesia Type: General Level of consciousness: awake and alert Pain management: pain level controlled Vital Signs Assessment: post-procedure vital signs reviewed and stable Respiratory status: spontaneous breathing, nonlabored ventilation, respiratory function stable and patient connected to nasal cannula oxygen Cardiovascular status: blood pressure returned to baseline and stable Postop Assessment: no apparent nausea or vomiting Anesthetic complications: no   No notable events documented.   Last Vitals:  Vitals:   06/19/23 1230 06/19/23 1300  BP: (!) 141/93 (!) 161/100  Pulse: 93 92  Resp: 17 (!) 27  Temp:    SpO2: 100% 100%    Last Pain:  Vitals:   06/19/23 1104  TempSrc:   PainSc: 0-No pain                  C 

## 2023-06-19 NOTE — Anesthesia Preprocedure Evaluation (Addendum)
Anesthesia Evaluation  Patient identified by MRN, date of birth, ID band Patient awake    Reviewed: Allergy & Precautions, H&P , NPO status , Patient's Chart, lab work & pertinent test results  Airway Mallampati: II  TM Distance: >3 FB Neck ROM: Full    Dental no notable dental hx. (+) Edentulous Upper, Poor Dentition   Pulmonary neg pulmonary ROS   Pulmonary exam normal breath sounds clear to auscultation       Cardiovascular hypertension, Normal cardiovascular exam Rhythm:Regular Rate:Normal     Neuro/Psych  Headaches PSYCHIATRIC DISORDERS  Depression     Neuromuscular disease negative neurological ROS  negative psych ROS   GI/Hepatic negative GI ROS, Neg liver ROS,,,  Endo/Other  diabetes    Renal/GU Renal diseasenegative Renal ROS  negative genitourinary   Musculoskeletal negative musculoskeletal ROS (+)    Abdominal   Peds negative pediatric ROS (+)  Hematology negative hematology ROS (+) Blood dyscrasia, anemia   Anesthesia Other Findings   Reproductive/Obstetrics negative OB ROS                              Anesthesia Physical Anesthesia Plan  ASA: 4  Anesthesia Plan: General   Post-op Pain Management:    Induction: Intravenous  PONV Risk Score and Plan:   Airway Management Planned: LMA  Additional Equipment:   Intra-op Plan:   Post-operative Plan: Extubation in OR  Informed Consent: I have reviewed the patients History and Physical, chart, labs and discussed the procedure including the risks, benefits and alternatives for the proposed anesthesia with the patient or authorized representative who has indicated his/her understanding and acceptance.     Dental Advisory Given  Plan Discussed with: Anesthesiologist, CRNA and Surgeon  Anesthesia Plan Comments: (Patient consented for risks of anesthesia including but not limited to:  - adverse reactions to  medications - damage to eyes, teeth, lips or other oral mucosa - nerve damage due to positioning  - sore throat or hoarseness - Damage to heart, brain, nerves, lungs, other parts of body or loss of life  Patient voiced understanding.)         Anesthesia Quick Evaluation

## 2023-06-19 NOTE — Progress Notes (Signed)
       CROSS COVER NOTE  NAME: Emma Thomas MRN: [id number] DOB : 15-Dec-1995    Concern as stated by nurse / staff   GN Dr. Para March, Pt. in Rm 201 admit post op partial R great toe amputation. Pt is Type 1 DM and uses a pump at home. Pt BS has been elevated in the 300's and Pt noted this has been since her admit. Pt. is requesting a long acting insulin to cover her throughout the night. Pt does have an order for ACHS. Can we oblige the Pt request, thanks.      Pertinent findings on chart review: H&P reviewed.  Patient on sliding scale coverage only  Assessment and  Interventions   Assessment: Hyperglycemia  Plan: Semglee added 12 units nightly calculated at 0.5 units /kg Continue sliding scale X

## 2023-06-19 NOTE — Op Note (Signed)
PODIATRY / FOOT AND ANKLE SURGERY OPERATIVE REPORT    SURGEON: Rosetta Posner, DPM  PRE-OPERATIVE DIAGNOSIS:  1.  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  Diabetes type 1 polyneuropathy  POST-OPERATIVE DIAGNOSIS: Same  PROCEDURE(S): Left partial first ray amputation Antibiotic bead application  HEMOSTASIS: Left ankle tourniquet  ANESTHESIA: general  ESTIMATED BLOOD LOSS: 10 cc  FINDING(S): 1.  Osteomyelitis present to the first ray extending to the first metatarsal base  PATHOLOGY/SPECIMEN(S): Wound culture, path specimen with proximal margin marked in purple ink first metatarsal base, bone culture first metatarsal  INDICATIONS:   Emma Thomas is a 27 y.o. female who presents with a abscess present to the left first ray.  Patient was seen in clinic yesterday and had an I&D performed in clinic releasing a large amount of seropurulent discharge from the area.  Patient also had associated erythema and edema and signs consistent with possible osteomyelitis on x-ray.  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  All treatment options were discussed with the patient both conservative and surgical attempts at correction include potential risks and complications at this time patient is elected for surgical intervention consisting of left partial first ray amputation with antibiotic bead application.  No guarantees given.  All questions answered.  Consent obtained prior to procedure..  DESCRIPTION: After obtaining full informed written consent, the patient was brought back to the operating room and placed supine upon the operating table.  The patient received IV antibiotics prior to induction.  After obtaining adequate anesthesia, the patient  was prepped and draped in the standard fashion.  20 cc of half percent Marcaine plain was injected about the left first ray in a Mayo type block.  An Esmarch bandage was used to exsanguinate the left lower extremity and the pneumatic ankle tourniquet was inflated.  Attention was directed to the first metatarsal where an incision was made removing some of the excess skin from the previous amputation.  An ellipse was performed removing the area of the I&D skin and the incision was extended proximally to the first metatarsal base and first tarsometatarsal joint area.  Incision was made straight to bone.  Serous discharge was present but no further purulence was present upon examination today.  A wound culture was taken and passed off the operative site.  Wound culture was also taken in clinic yesterday, will continue to monitor.  Circumferential dissection was then continued around the first metatarsal at the distal aspect of the first metatarsal base.  The bone appeared to be very soft and consistent with osteomyelitis.  The sagittal saw was then used to resect a significant portion of the first metatarsal to almost the first tarsometatarsal joint.  The bone appeared to be somewhat hard in this area and appeared to be clear of infection clinically.  This bone fragment was removed and passed off the operative site.  A bone culture was taken from this bone and the proximal margin was marked in purple ink.  The bone cultures passed off the operative site along with the path specimen.  Surgical site was flushed with copious amounts normal sterile saline utilizing the pulse lavage.  Antibiotic beads were then packed into the site at the area of the resection of bone and within the soft tissues.  The deep structures were reapproximated well coapted with 3-0 Vicryl.  The subcutaneous tissue was reapproximated well coapted with 3-0 Vicryl.  The skin was then reapproximated well coapted with 3-0 nylon in a combination of  simple and horizontal mattress type stitching.  The pneumatic ankle tourniquet was deflated and a prompt hyperemic response was noted all digits left foot.  The patient tolerated the procedure and anesthesia well and was transferred to recovery room vital signs stable vascular status intact all toes left foot.  Following a period of postoperative monitoring the patient be discharged back to the inpatient room with the appropriate orders, instructions, and medications.  Patient is to remain partial weightbearing with heel contact and postop shoe.  Physical therapy has been ordered.  Will continue to monitor cultures.  If looking good tomorrow could consider discharge on oral antibiotics.  Will see tomorrow for dressing change and make decisions at that point.  COMPLICATIONS: None  CONDITION: Good, stable  Rosetta Posner, DPM

## 2023-06-19 NOTE — Anesthesia Procedure Notes (Signed)
Procedure Name: LMA Insertion Date/Time: 06/19/2023 7:54 AM  Performed by: Karoline Caldwell, CRNAPre-anesthesia Checklist: Patient identified, Patient being monitored, Timeout performed, Emergency Drugs available and Suction available Patient Re-evaluated:Patient Re-evaluated prior to induction Oxygen Delivery Method: Circle system utilized Preoxygenation: Pre-oxygenation with 100% oxygen Induction Type: IV induction Ventilation: Mask ventilation without difficulty LMA: LMA inserted LMA Size: 4.0 Tube type: Oral Number of attempts: 1 Placement Confirmation: positive ETCO2 and breath sounds checked- equal and bilateral Tube secured with: Tape Dental Injury: Teeth and Oropharynx as per pre-operative assessment

## 2023-06-19 NOTE — H&P (Signed)
HISTORY AND PHYSICAL INTERVAL NOTE:  06/19/2023  7:20 AM  Emma Thomas  has presented today for surgery, with the diagnosis of left foot infection, osteomyelitis, abscess.  The various methods of treatment have been discussed with the patient.  No guarantees were given.  After consideration of risks, benefits and other options for treatment, the patient has consented to surgery.  I have reviewed the patients' chart and labs.    PROCEDURE: LEFT FOOT PARTIAL 1ST RAY AMPUTATION LEFT FOOT INCISION AND DRAINAGE ANTIBIOTIC BEAD APPLICATION   A history and physical examination was performed in the hospital.  The patient was reexamined.  There have been no changes to this history and physical examination.  Rosetta Posner, DPM

## 2023-06-19 NOTE — Progress Notes (Signed)
[facility] Progress Note  Patient: Emma Thomas    QMV:[id number]  DOA: 06/18/2023     Date of Service: the patient was seen and examined on 06/19/2023  Chief Complaint  Patient presents with   Foot Swelling   Brief hospital course: Emma Thomas is a 27 y.o. female with medical history significant of type 1 diabetes with insulin pump in place, end-stage renal disease on hemodialysis at home daily, hypertension, anemia of chronic disease, history of diabetic foot infection status post amputations of the great toes bilaterally presenting with diabetic foot infection.  Patient noted to be followed by Dr. Excell Seltzer with podiatry outpatient.  Per report, patient with worsening redness pain and swelling.  Positive generalized malaise at home over multiple days.  Patient reports blood sugars have been poorly controlled at home.  No fevers or chills.  No nausea or vomiting.  No chest pain or shortness of breath.  No abdominal pain.  Was evaluated at Dr. Bernette Redbird office today and the area of the left foot at the base of the first and second digits which have been previously amputated.  Worsening redness and pain with concern for infection.  Patient redirected to the ER for imaging and likely operative evaluation. Presented to the ER afebrile, hemodynamically stable.  White count 9.3, hemoglobin 10.2, creatinine 9.6.  Pending MRI of the left foot.  Assessment and Plan:  # Osteomyelitis, left first metatarsal with abscess due to DM Podiatry consulted s/p Left partial first ray amputation and Antibiotic bead application MRI reviewed Continue empiric antibiotics vancomycin, ceftriaxone and Flagyl Pharmacy consulted for dosing and trough monitoring Follow culture from the OR Follow podiatry for further recommendation ID consult on Monday   # ESRD on HD Nephrology consulted for hemodialysis  # Diastolic dysfunction 2D echo 08/05/2022 with EF 55 to 60% and grade 1 diastolic dysfunction Monitor  volume status in the setting ESRD Pending dialysis today Appears fairly euvolemic Follow   # 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 (HCC) SSI A1c 8.5   # Hypertension Resumed amlodipine 5 mg p.o. daily Monitor BP and titrate medication accordingly   # Anemia of chronic disease, continue to monitor H&H  Body mass index is 25.83 kg/m.  Nutrition Problem: Increased nutrient needs Etiology: wound healing Interventions: Interventions: MVI, Juven, Liberalize Diet   Diet: Carb modified diet DVT Prophylaxis: Subcutaneous Heparin    Advance goals of care discussion: Full code  Family Communication: family was present at bedside, at the time of interview.  The pt provided permission to discuss medical plan with the family. Opportunity was given to ask question and all questions were answered satisfactorily.   Disposition:  Pt is from Home, admitted with left first metatarsal osteomyelitis, s/p resection, on IV antibiotics, awaiting for cultures, podiatry and ID consult, which precludes a safe discharge. Discharge to Home, when stable and cleared by podiatry.  Subjective: No significant events overnight, patient was seen after first metatarsal resection.  Patient was getting hemodialysis done, resting overnight, stated that her pain is under control currently pain is 4/10, no any other complaints.  Physical Exam: General: NAD, lying comfortably Appear in no distress, affect appropriate Eyes: PERRLA ENT: Oral Mucosa Clear, moist  Neck: no JVD,  Cardiovascular: S1 and S2 Present, no Murmur,  Respiratory: good respiratory effort, Bilateral Air entry equal and Decreased, no Crackles, no wheezes Abdomen: Bowel Sound present, Soft and no tenderness,  Skin: no rashes Extremities: no Pedal edema, no calf tenderness, left  foot dressing CDI Neurologic: without any new focal findings Gait not checked due to patient safety concerns  Vitals:   06/19/23 1330 06/19/23  1400 06/19/23 1430 06/19/23 1455  BP: (!) 138/90 (!) 151/94 (!) 148/93   Pulse: 87 96 100   Resp: 15 17 15    Temp:   99.2 F (37.3 C)   TempSrc:   Oral   SpO2: 100% 100% 100%   Weight:    72.6 kg  Height:        Intake/Output Summary (Last 24 hours) at 06/19/2023 1534 Last data filed at 06/19/2023 1527 Gross per 24 hour  Intake 579 ml  Output 1850 ml  Net -1271 ml   Filed Weights   06/18/23 1227 06/19/23 1046 06/19/23 1455  Weight: 68 kg 74.1 kg 72.6 kg    Data Reviewed: I have personally reviewed and interpreted daily labs, tele strips, imagings as discussed above. I reviewed all nursing notes, pharmacy notes, vitals, pertinent old records I have discussed plan of care as described above with RN and patient/family.  CBC: Recent Labs  Lab 06/18/23 1235 06/19/23 0437  WBC 9.3 5.8  HGB 10.2* 9.6*  HCT 30.5* 29.9*  MCV 89.4 92.3  PLT 308 297   Basic Metabolic Panel: Recent Labs  Lab 06/18/23 1231 06/19/23 0437  NA 133* 129*  K 4.2 4.6  CL 95* 93*  CO2 23 14*  GLUCOSE 93 404*  BUN 63* 72*  CREATININE 9.58* 10.14*  CALCIUM 8.0* 7.8*    Studies: No results found.  Scheduled Meds:  Chlorhexidine Gluconate Cloth  6 each Topical Q0600   heparin  5,000 Units Subcutaneous Q8H   insulin aspart  0-6 Units Subcutaneous TID AC & HS   multivitamin  1 tablet Oral QHS   nutrition supplement (JUVEN)  1 packet Oral BID BM   sodium chloride flush  3 mL Intravenous Q12H   vancomycin variable dose per unstable renal function (pharmacist dosing)   Does not apply See admin instructions   Continuous Infusions:  sodium chloride Stopped (06/19/23 0104)   anticoagulant sodium citrate     cefTRIAXone (ROCEPHIN)  IV Stopped (06/18/23 2348)   metronidazole 0 mg (06/18/23 2301)   vancomycin 750 mg (06/19/23 1519)   PRN Meds: sodium chloride, alteplase, anticoagulant sodium citrate, heparin, HYDROmorphone (DILAUDID) injection, lidocaine (PF), lidocaine-prilocaine, ondansetron  **OR** ondansetron (ZOFRAN) IV, pentafluoroprop-tetrafluoroeth, sodium chloride flush  Time spent: 35 minutes  Author: Gillis Santa. MD [facility] 06/19/2023 3:34 PM  To reach On-call, see care teams to locate the attending and reach out to them via [email or web address]. If 7PM-7AM, please contact night-coverage If you still have difficulty reaching the attending provider, please page the [facility] (Director on Call) for [facility] on amion for assistance.

## 2023-06-19 NOTE — Progress Notes (Signed)
PT Cancellation Note  Patient Details Name: Emma Thomas MRN: [id number] DOB: [birth year]   Cancelled Treatment:    Reason Eval/Treat Not Completed: Other (comment).  PT consult received.  Chart reviewed.  Pt s/p surgery this morning (L partial first ray amputation) and now off floor at dialysis.  Will re-attempt PT evaluation at a later date/time.  Hendricks Limes, PT 06/19/23, 11:22 AM

## 2023-06-19 NOTE — Transfer of Care (Signed)
Immediate Anesthesia Transfer of Care Note  Patient: Emma Thomas  Procedure(s) Performed: IRRIGATION AND DEBRIDEMENT FOOT PARTIAL 1ST RAY AMPUTATION (Left)  Patient Location: PACU  Anesthesia Type:General  Level of Consciousness: awake, alert , and oriented  Airway & Oxygen Therapy: Patient Spontanous Breathing  Post-op Assessment: Report given to RN and Post -op Vital signs reviewed and stable  Post vital signs: Reviewed and stable  Last Vitals:  Vitals Value Taken Time  BP 113/75 06/19/23 0854  Temp 36.3 C 06/19/23 0854  Pulse 84 06/19/23 0854  Resp 17 06/19/23 0854  SpO2 97 % 06/19/23 0854    Last Pain:  Vitals:   06/19/23 0854  TempSrc:   PainSc: 0-No pain      Patients Stated Pain Goal: 0 (06/19/23 0344)  Complications: No notable events documented.

## 2023-06-19 NOTE — Progress Notes (Signed)
Referring Provider: No ref. provider found Primary Care Physician:  [name] Primary Nephrologist:  Dr.   Jaquita Rector for Consultation: ESRD  HPI: She is a 27 year old female with a past medical history of type 1 diabetes on insulin pump, hypertension, history of diabetic foot infections, end-stage renal disease on hemodialysis now admitted for foot infection.  She had toe amputation this morning.  Patient is on scheduled for dialysis today.  She denies any chest pain, shortness of breath or orthopnea.  Past Medical History:  Diagnosis Date   Anemia    Diabetes mellitus without complication (HCC)    Type 1 DM   Essential hypertension    Headache    Hypertension 03/04/2013   Neurologic disorder    Both feet   Neuromuscular disorder (HCC)    Recurrent UTI    Renal disorder     Past Surgical History:  Procedure Laterality Date   abscess removal     excision of bartholin cyst   AMPUTATION Left 11/23/2022   Procedure: LEFT GREAT TOE PARTIAL RAY AMPUTATION;  Surgeon: Linus Galas, DPM;  Location: [facility];  Service: Podiatry;  Laterality: Left;   CAPD INSERTION N/A 06/09/2022   Procedure: LAPAROSCOPIC INSERTION CONTINUOUS AMBULATORY PERITONEAL DIALYSIS  (CAPD) CATHETER, PD rep to be present;  Surgeon: Henrene Dodge, MD;  Location: [facility];  Service: General;  Laterality: N/A;   CAPD INSERTION N/A 12/15/2022   Procedure: LAPAROSCOPIC INSERTION CONTINUOUS AMBULATORY PERITONEAL DIALYSIS  (CAPD) CATHETER;  Surgeon: Henrene Dodge, MD;  Location: [facility];  Service: General;  Laterality: N/A;   CAPD REMOVAL N/A 12/15/2022   Procedure: LAPAROSCOPIC REMOVAL CONTINUOUS AMBULATORY PERITONEAL DIALYSIS  (CAPD) CATHETER, removal of old catheter;  Surgeon: Henrene Dodge, MD;  Location: [facility];  Service: General;  Laterality: N/A;   CAPD REMOVAL N/A 01/08/2023   Procedure: LAPAROSCOPIC REMOVAL CONTINUOUS AMBULATORY PERITONEAL DIALYSIS  (CAPD) CATHETER;  Surgeon: Henrene Dodge, MD;  Location:  [facility];  Service: General;  Laterality: N/A;   CAPD REVISION N/A 07/16/2022   Procedure: LAPAROSCOPIC REVISION CONTINUOUS AMBULATORY PERITONEAL DIALYSIS  (CAPD) CATHETER;  Surgeon: Henrene Dodge, MD;  Location: [facility];  Service: General;  Laterality: N/A;   CAPD REVISION N/A 11/24/2022   Procedure: LAPAROSCOPIC REVISION CONTINUOUS AMBULATORY PERITONEAL DIALYSIS  (CAPD) CATHETER;  Surgeon: Henrene Dodge, MD;  Location: [facility];  Service: General;  Laterality: N/A;   DIALYSIS/PERMA CATHETER INSERTION N/A 04/24/2022   Procedure: DIALYSIS/PERMA CATHETER INSERTION;  Surgeon: Renford Dills, MD;  Location: [facility];  Service: Cardiovascular;  Laterality: N/A;   DIALYSIS/PERMA CATHETER REMOVAL N/A 06/02/2022   Procedure: DIALYSIS/PERMA CATHETER REMOVAL;  Surgeon: Renford Dills, MD;  Location: [facility];  Service: Cardiovascular;  Laterality: N/A;   DIALYSIS/PERMA CATHETER REMOVAL N/A 12/28/2022   Procedure: DIALYSIS/PERMA CATHETER REMOVAL;  Surgeon: Annice Needy, MD;  Location: [facility];  Service: Cardiovascular;  Laterality: N/A;   INSERTION OF DIALYSIS CATHETER Right    ARM   IR FLUORO GUIDE CV LINE RIGHT  11/27/2022   IR US GUIDE VASC ACCESS RIGHT  11/27/2022   Nexplanon  01/2011   UMBILICAL HERNIA REPAIR N/A 06/09/2022   Procedure: HERNIA REPAIR UMBILICAL ADULT;  Surgeon: Henrene Dodge, MD;  Location: [facility];  Service: General;  Laterality: N/A;    Prior to Admission medications   Medication Sig Start Date End Date Taking? Authorizing Provider  acetaminophen (TYLENOL) 500 MG tablet Take 2 tablets (1,000 mg total) by mouth every 6 (six) hours  as needed for mild pain. 01/08/23  Yes Piscoya, Elita Quick, MD  amLODipine (NORVASC) 10 MG tablet Take 1 tablet (10 mg total) by mouth daily. 04/28/22  Yes Lurene Shadow, MD  atorvastatin (LIPITOR) 80 MG tablet Take 80 mg by mouth daily.   Yes [provider]  bumetanide (BUMEX) 1 MG tablet Take 1 mg by mouth 2  (two) times daily.   Yes [provider]  furosemide (LASIX) 80 MG tablet Take 80 mg by mouth daily.   Yes [provider]  HUMALOG 100 UNIT/ML injection Inject 0-1 mLs (0-100 Units total) into the skin daily. Uses with Insulin Pump 04/09/23  Yes Kathlen Mody, MD  losartan (COZAAR) 100 MG tablet Take 100 mg by mouth daily.   Yes [provider]  montelukast (SINGULAIR) 10 MG tablet Take 10 mg by mouth at bedtime.   Yes [provider]  norethindrone-ethinyl estradiol (LOESTRIN) 1-20 MG-MCG tablet Take 1 tablet by mouth daily.   Yes [provider]  ondansetron (ZOFRAN) 8 MG tablet Take 8 mg by mouth daily.   Yes [provider]  sevelamer carbonate (RENVELA) 800 MG tablet Take 800-1,600 mg by mouth See admin instructions. Take 2 tablets (1600mg ) by mouth three times daily with meals and take 1 tablet (800mg ) by mouth daily with snacks   Yes [provider]  SUMAtriptan (IMITREX) 50 MG tablet Take 50 mg by mouth daily as needed for migraine.   Yes [provider]  potassium chloride (KLOR-CON M) 10 MEQ tablet Take 1 tablet (10 mEq total) by mouth 2 (two) times daily for 7 days. 04/08/23 04/22/23  Kathlen Mody, MD  pregabalin (LYRICA) 25 MG capsule Take 1 capsule (25 mg total) by mouth 2 (two) times daily. Patient not taking: Reported on 06/18/2023 04/22/23   Edward Jolly, MD    Current Facility-Administered Medications  Medication Dose Route Frequency Provider Last Rate Last Admin   0.9 %  sodium chloride infusion  250 mL Intravenous PRN Floydene Flock, MD   Stopped at 06/19/23 0104   alteplase (CATHFLO ACTIVASE) injection 2 mg  2 mg Intracatheter Once PRN Wendee Beavers, NP       anticoagulant sodium citrate solution 5 mL  5 mL Intracatheter PRN Wendee Beavers, NP       cefTRIAXone (ROCEPHIN) 2 g in sodium chloride 0.9 % 100 mL IVPB  2 g Intravenous Q24H Floydene Flock, MD   Stopped at 06/18/23 2348   Chlorhexidine  Gluconate Cloth 2 % PADS 6 each  6 each Topical Q0600 Wendee Beavers, NP   6 each at 06/19/23 0538   heparin injection 1,000 Units  1,000 Units Intracatheter PRN Wendee Beavers, NP       heparin injection 5,000 Units  5,000 Units Subcutaneous Q8H Floydene Flock, MD       HYDROmorphone (DILAUDID) injection 1 mg  1 mg Intravenous Q2H PRN Floydene Flock, MD   1 mg at 06/19/23 0344   insulin aspart (novoLOG) injection 0-6 Units  0-6 Units Subcutaneous TID AC & HS Manuela Schwartz, NP   6 Units at 06/18/23 2314   lidocaine (PF) (XYLOCAINE) 1 % injection 5 mL  5 mL Intradermal PRN Wendee Beavers, NP       lidocaine-prilocaine (EMLA) cream 1 Application  1 Application Topical PRN Wendee Beavers, NP       metroNIDAZOLE (FLAGYL) IVPB 500 mg  500 mg Intravenous Q12H Floydene Flock, MD 0 mL/hr at 06/18/23 2301 500 mg at 06/19/23  0830   multivitamin (RENA-VIT) tablet 1 tablet  1 tablet Oral QHS Floydene Flock, MD   1 tablet at 06/18/23 2151   nutrition supplement (JUVEN) (JUVEN) powder packet 1 packet  1 packet Oral BID BM Floydene Flock, MD       ondansetron [facility]) tablet 4 mg  4 mg Oral Q6H PRN Floydene Flock, MD       Or   ondansetron [facility]) injection 4 mg  4 mg Intravenous Q6H PRN Floydene Flock, MD   4 mg at 06/19/23 0343   pentafluoroprop-tetrafluoroeth (GEBAUERS) aerosol 1 Application  1 Application Topical PRN Wendee Beavers, NP       sodium chloride flush (NS) 0.9 % injection 3 mL  3 mL Intravenous Q12H Floydene Flock, MD   3 mL at 06/18/23 1444   sodium chloride flush (NS) 0.9 % injection 3 mL  3 mL Intravenous PRN Floydene Flock, MD       vancomycin variable dose per unstable renal function (pharmacist dosing)   Does not apply See admin instructions Angelique Blonder, RPH        Allergies as of 06/18/2023   (No Known Allergies)    Family History  Problem Relation Age of Onset   Breast cancer Mother 35   Lung cancer Maternal Grandmother    Diabetes type  II Paternal Grandmother     Social History   Socioeconomic History   Marital status: Married    Spouse name: Cameo   Number of children: 1   Years of education: Not on file   Highest education level: Not on file  Occupational History   Not on file  Tobacco Use   Smoking status: Never    Passive exposure: Past   Smokeless tobacco: Never  Vaping Use   Vaping status: Never Used  Substance and Sexual Activity   Alcohol use: Not Currently    Alcohol/week: 2.0 standard drinks of alcohol    Types: 2 Shots of liquor per week   Drug use: No   Sexual activity: Yes    Birth control/protection: None  Other Topics Concern   Not on file  Social History Narrative   Not on file   Social Determinants of Health   Financial Resource Strain: Low Risk  (05/31/2023)   Received from [facility]   Overall Financial Resource Strain (CARDIA)    Difficulty of Paying Living Expenses: Not hard at all  Food Insecurity: No Food Insecurity (06/18/2023)   Hunger Vital Sign    Worried About Running Out of Food in the Last Year: Never true    Ran Out of Food in the Last Year: Never true  Transportation Needs: No Transportation Needs (06/18/2023)   PRAPARE - Administrator, Civil Service (Medical): No    Lack of Transportation (Non-Medical): No  Physical Activity: Not on file  Stress: Not on file  Social Connections: Not on file  Intimate Partner Violence: Not At Risk (06/18/2023)   Humiliation, Afraid, Rape, and Kick questionnaire    Fear of Current or Ex-Partner: No    Emotionally Abused: No    Physically Abused: No    Sexually Abused: No    Physical Exam: Vital signs in last 24 hours: Temp:  [97.4 F (36.3 C)-98.8 F (37.1 C)] 97.6 F (36.4 C) (08/03 0925) Pulse Rate:  [84-99] 86 (08/03 0925) Resp:  [11-20] 11 (08/03 0925) BP: (113-145)/(75-87) 135/86 (08/03 0925) SpO2:  [97 %-100 %] 97 % (  08/03 0925) Weight:  [68 kg] 68 kg (08/02 1227) Last BM Date :  06/17/23 General:   Alert,  Well-developed, well-nourished, pleasant and cooperative in NAD Head:  Normocephalic and atraumatic. Eyes:  Sclera clear, no icterus.   Conjunctiva pink. Ears:  Normal auditory acuity. Nose:  No deformity, discharge,  or lesions. Lungs:  Clear throughout to auscultation.   No wheezes, crackles, or rhonchi. No acute distress. Heart:  Regular rate and rhythm; no murmurs, clicks, rubs,  or gallops. Abdomen:  Soft, nontender and nondistended. No masses, hepatosplenomegaly or hernias noted. Normal bowel sounds, without guarding, and without rebound.   Extremities:  Without clubbing or edema.  Intake/Output from previous day: 08/02 0701 - 08/03 0700 In: 359.7 [I.V.:15.6; IV Piggyback:344.1] Out: 350 [Urine:250; Emesis/NG output:100] Intake/Output this shift: Total I/O In: 200 [IV Piggyback:200] Out: -   Lab Results: Recent Labs    06/18/23 1235 06/19/23 0437  WBC 9.3 5.8  HGB 10.2* 9.6*  HCT 30.5* 29.9*  PLT 308 297   BMET Recent Labs    06/18/23 1231 06/19/23 0437  NA 133* 129*  K 4.2 4.6  CL 95* 93*  CO2 23 14*  GLUCOSE 93 404*  BUN 63* 72*  CREATININE 9.58* 10.14*  CALCIUM 8.0* 7.8*   LFT Recent Labs    06/19/23 0437  PROT 7.0  ALBUMIN 2.9*  AST 11*  ALT 11  ALKPHOS 51  BILITOT 1.4*   PT/INR No results for input(s): "LABPROT", "INR" in the last 72 hours. Hepatitis Panel Recent Labs    06/18/23 1511  HEPBSAG NON REACTIVE    Studies/Results: MR FOOT LEFT WO CONTRAST  Result Date: 06/18/2023 CLINICAL DATA:  Left foot infection.  History of prior amputation. EXAM: MRI OF THE LEFT FOOT WITHOUT CONTRAST TECHNIQUE: Multiplanar, multisequence MR imaging of the left forefoot was performed. No intravenous contrast was administered. COMPARISON:  MRI left foot and left foot x-rays dated November 20, 2022. FINDINGS: Bones/Joint/Cartilage Prior first ray amputation. Abnormal marrow edema with corresponding decreased T1 marrow signal involving  the distal residual first metatarsal. 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 No fracture or dislocation. No joint effusion. Ligaments Second through fifth toe collateral ligaments are intact. Lisfranc ligament is intact. Muscles and Tendons Second through fifth flexor and extensor tendons are intact. No tenosynovitis. Increased T2 signal within the intrinsic muscles of the forefoot, nonspecific, but likely related to diabetic muscle changes. Soft tissue 4.8 x 2.1 x 2.9 cm complex fluid collection at the first ray amputation site (series 10, image 14), with two sinus tracts draining to the skin surface (series 7, images 16 and 29). No soft tissue mass. IMPRESSION: 1. 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 2. 4.8 x 2.1 x 2.9 cm abscess at the first ray amputation site. 3. 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 Electronically Signed   By: Obie Dredge M.D.   On: 06/18/2023 15:08    Assessment/Plan:  She is a 27 year old female with a past medical history of type 1 diabetes on insulin pump, hypertension, history of diabetic foot infections, end-stage renal disease on hemodialysis now admitted for foot infection.  She had toe amputation this morning.  Patient is on scheduled for dialysis today.  She denies any chest pain, shortness of breath or orthopnea.   ESRD: Will schedule for dialysis as ordered today.  ANEMIA: Continue to follow anemia protocols.  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  HTN/VOL: Blood pressure is well-controlled.  Foot infection: Has been on Rocephin and vancomycin.  She had left foot partial amputation this morning.  Patient can be discharged home after dialysis if stable. Labs and medications reviewed. Will continue to monitor closely.    LOS: 1 Lorain Childes, MD [facility] @TODAY @10 :10 AM

## 2023-06-20 DIAGNOSIS — E11628 Type 2 diabetes mellitus with other skin complications: Secondary | ICD-10-CM | POA: Diagnosis not present

## 2023-06-20 DIAGNOSIS — L089 Local infection of the skin and subcutaneous tissue, unspecified: Secondary | ICD-10-CM | POA: Diagnosis not present

## 2023-06-20 LAB — CBC
HCT: 32.2 % — ABNORMAL LOW (ref 36.0–46.0)
Hemoglobin: 10.6 g/dL — ABNORMAL LOW (ref 12.0–15.0)
MCH: 29.4 pg (ref 26.0–34.0)
MCHC: 32.9 g/dL (ref 30.0–36.0)
MCV: 89.4 fL (ref 80.0–100.0)
Platelets: 350 10*3/uL (ref 150–400)
RBC: 3.6 MIL/uL — ABNORMAL LOW (ref 3.87–5.11)
RDW: 11.9 % (ref 11.5–15.5)
WBC: 7.2 10*3/uL (ref 4.0–10.5)
nRBC: 0 % (ref 0.0–0.2)

## 2023-06-20 LAB — BASIC METABOLIC PANEL WITH GFR
Anion gap: 16 — ABNORMAL HIGH (ref 5–15)
BUN: 48 mg/dL — ABNORMAL HIGH (ref 6–20)
CO2: 22 mmol/L (ref 22–32)
Calcium: 7.9 mg/dL — ABNORMAL LOW (ref 8.9–10.3)
Chloride: 91 mmol/L — ABNORMAL LOW (ref 98–111)
Creatinine, Ser: 6.6 mg/dL — ABNORMAL HIGH (ref 0.44–1.00)
GFR, Estimated: 8 mL/min — ABNORMAL LOW (ref >60)
Glucose, Bld: 476 mg/dL — ABNORMAL HIGH (ref 70–99)
Potassium: 4.2 mmol/L (ref 3.5–5.1)
Sodium: 129 mmol/L — ABNORMAL LOW (ref 135–145)

## 2023-06-20 LAB — GLUCOSE, CAPILLARY
Glucose-Capillary: 478 mg/dL — ABNORMAL HIGH (ref 70–99)
Glucose-Capillary: 415 mg/dL — ABNORMAL HIGH (ref 70–99)
Glucose-Capillary: 468 mg/dL — ABNORMAL HIGH (ref 70–99)
Glucose-Capillary: 496 mg/dL — ABNORMAL HIGH (ref 70–99)
Glucose-Capillary: 529 mg/dL (ref 70–99)
Glucose-Capillary: 358 mg/dL — ABNORMAL HIGH (ref 70–99)
Glucose-Capillary: 209 mg/dL — ABNORMAL HIGH (ref 70–99)
Glucose-Capillary: 177 mg/dL — ABNORMAL HIGH (ref 70–99)
Glucose-Capillary: 234 mg/dL — ABNORMAL HIGH (ref 70–99)

## 2023-06-20 LAB — MAGNESIUM: Magnesium: 2.5 mg/dL — ABNORMAL HIGH (ref 1.7–2.4)

## 2023-06-20 LAB — PHOSPHORUS: Phosphorus: 7.3 mg/dL — ABNORMAL HIGH (ref 2.5–4.6)

## 2023-06-20 MED ORDER — INSULIN ASPART 100 UNIT/ML IJ SOLN
10.0000 [IU] | Freq: Once | INTRAMUSCULAR | Status: AC
  Administered 2023-06-20: 10 [IU] via INTRAVENOUS
  Filled 2023-06-20: qty 1

## 2023-06-20 MED ORDER — VANCOMYCIN HCL 750 MG/150ML IV SOLN
750.0000 mg | Freq: Once | INTRAVENOUS | Status: DC
  Filled 2023-06-20: qty 150

## 2023-06-20 MED ORDER — CALCIUM ACETATE (PHOS BINDER) 667 MG PO CAPS
667.0000 mg | ORAL_CAPSULE | Freq: Three times a day (TID) | ORAL | Status: DC
  Administered 2023-06-20 (×2): 667 mg via ORAL
  Filled 2023-06-20 (×2): qty 1

## 2023-06-20 MED ORDER — INSULIN REGULAR(HUMAN) IN NACL 100-0.9 UT/100ML-% IV SOLN: INTRAVENOUS | Status: DC

## 2023-06-20 MED ORDER — INSULIN ASPART 100 UNIT/ML IJ SOLN
4.0000 [IU] | Freq: Once | INTRAMUSCULAR | Status: AC
  Administered 2023-06-20: 4 [IU] via INTRAVENOUS
  Filled 2023-06-20: qty 0.04

## 2023-06-20 MED ORDER — AMOXICILLIN-POT CLAVULANATE 500-125 MG PO TABS: 1.0000 | ORAL_TABLET | Freq: Every day | ORAL | 0 refills | Status: AC

## 2023-06-20 MED ORDER — DEXTROSE 50 % IV SOLN: 0.0000 mL | INTRAVENOUS | Status: DC | PRN

## 2023-06-20 MED ORDER — INSULIN GLARGINE-YFGN 100 UNIT/ML ~~LOC~~ SOLN
12.0000 [IU] | Freq: Once | SUBCUTANEOUS | Status: AC
  Administered 2023-06-20: 12 [IU] via SUBCUTANEOUS
  Filled 2023-06-20: qty 0.12

## 2023-06-20 MED ORDER — OXYCODONE-ACETAMINOPHEN 5-325 MG PO TABS: 1.0000 | ORAL_TABLET | Freq: Four times a day (QID) | ORAL | 0 refills | Status: AC | PRN

## 2023-06-20 MED ORDER — INSULIN GLARGINE-YFGN 100 UNIT/ML ~~LOC~~ SOLN
25.0000 [IU] | Freq: Every day | SUBCUTANEOUS | Status: DC
  Administered 2023-06-20: 25 [IU] via SUBCUTANEOUS
  Filled 2023-06-20 (×2): qty 0.25

## 2023-06-20 MED ORDER — VITAMIN D (ERGOCALCIFEROL) 1.25 MG (50000 UNIT) PO CAPS
50000.0000 [IU] | ORAL_CAPSULE | ORAL | Status: DC
  Administered 2023-06-20: 50000 [IU] via ORAL
  Filled 2023-06-20: qty 1

## 2023-06-20 MED ORDER — INSULIN ASPART 100 UNIT/ML IJ SOLN
6.0000 [IU] | Freq: Once | INTRAMUSCULAR | Status: AC
  Administered 2023-06-20: 6 [IU] via INTRAVENOUS
  Filled 2023-06-20 (×2): qty 0.06
  Filled 2023-06-20: qty 1

## 2023-06-20 NOTE — Progress Notes (Signed)
PODIATRY / FOOT AND ANKLE SURGERY CONSULTATION NOTE  Requesting Physician: Dr. Alvester Morin  Reason for consult: Left foot infection   HPI: Emma Thomas is a 27 y.o. female who presents 1 day after going a left partial first ray amputation revision/I&D with antibiotic bead application.  Patient has remained partial weightbearing with heel contact but has yet to receive surgical shoe despite orders.  PT order has also been placed but has been yet to be seen.  She is try to stay off her foot is much as possible.  She still having a fair amount of pain to the area of the procedure site.  PMHx:  Past Medical History:  Diagnosis Date   Anemia    Diabetes mellitus without complication (HCC)    Type 1 DM   Essential hypertension    Headache    Hypertension 03/04/2013   Neurologic disorder    Both feet   Neuromuscular disorder (HCC)    Recurrent UTI    Renal disorder     Surgical Hx:  Past Surgical History:  Procedure Laterality Date   abscess removal     excision of bartholin cyst   AMPUTATION Left 11/23/2022   Procedure: LEFT GREAT TOE PARTIAL RAY AMPUTATION;  Surgeon: Linus Galas, DPM;  Location: [facility];  Service: Podiatry;  Laterality: Left;   CAPD INSERTION N/A 06/09/2022   Procedure: LAPAROSCOPIC INSERTION CONTINUOUS AMBULATORY PERITONEAL DIALYSIS  (CAPD) CATHETER, PD rep to be present;  Surgeon: Henrene Dodge, MD;  Location: [facility];  Service: General;  Laterality: N/A;   CAPD INSERTION N/A 12/15/2022   Procedure: LAPAROSCOPIC INSERTION CONTINUOUS AMBULATORY PERITONEAL DIALYSIS  (CAPD) CATHETER;  Surgeon: Henrene Dodge, MD;  Location: [facility];  Service: General;  Laterality: N/A;   CAPD REMOVAL N/A 12/15/2022   Procedure: LAPAROSCOPIC REMOVAL CONTINUOUS AMBULATORY PERITONEAL DIALYSIS  (CAPD) CATHETER, removal of old catheter;  Surgeon: Henrene Dodge, MD;  Location: [facility];  Service: General;  Laterality: N/A;   CAPD REMOVAL N/A 01/08/2023   Procedure: LAPAROSCOPIC REMOVAL  CONTINUOUS AMBULATORY PERITONEAL DIALYSIS  (CAPD) CATHETER;  Surgeon: Henrene Dodge, MD;  Location: [facility];  Service: General;  Laterality: N/A;   CAPD REVISION N/A 07/16/2022   Procedure: LAPAROSCOPIC REVISION CONTINUOUS AMBULATORY PERITONEAL DIALYSIS  (CAPD) CATHETER;  Surgeon: Henrene Dodge, MD;  Location: [facility];  Service: General;  Laterality: N/A;   CAPD REVISION N/A 11/24/2022   Procedure: LAPAROSCOPIC REVISION CONTINUOUS AMBULATORY PERITONEAL DIALYSIS  (CAPD) CATHETER;  Surgeon: Henrene Dodge, MD;  Location: [facility];  Service: General;  Laterality: N/A;   DIALYSIS/PERMA CATHETER INSERTION N/A 04/24/2022   Procedure: DIALYSIS/PERMA CATHETER INSERTION;  Surgeon: Renford Dills, MD;  Location: [facility];  Service: Cardiovascular;  Laterality: N/A;   DIALYSIS/PERMA CATHETER REMOVAL N/A 06/02/2022   Procedure: DIALYSIS/PERMA CATHETER REMOVAL;  Surgeon: Renford Dills, MD;  Location: [facility];  Service: Cardiovascular;  Laterality: N/A;   DIALYSIS/PERMA CATHETER REMOVAL N/A 12/28/2022   Procedure: DIALYSIS/PERMA CATHETER REMOVAL;  Surgeon: Annice Needy, MD;  Location: [facility];  Service: Cardiovascular;  Laterality: N/A;   INSERTION OF DIALYSIS CATHETER Right    ARM   IR FLUORO GUIDE CV LINE RIGHT  11/27/2022   IR US GUIDE VASC ACCESS RIGHT  11/27/2022   Nexplanon  01/2011   UMBILICAL HERNIA REPAIR N/A 06/09/2022   Procedure: HERNIA REPAIR UMBILICAL ADULT;  Surgeon: Henrene Dodge, MD;  Location: [facility];  Service: General;  Laterality: N/A;    FHx:  Family History  Problem Relation Age of Onset   Breast cancer Mother 41   Lung cancer Maternal Grandmother    Diabetes type II Paternal Grandmother     Social History:  reports that she has never smoked. She has been exposed to tobacco smoke. She has never used smokeless tobacco. She reports that she does not currently use alcohol after a past usage of about 2.0 standard drinks of alcohol per week.  She reports that she does not use drugs.  Allergies: No Known Allergies  Medications Prior to Admission  Medication Sig Dispense Refill   acetaminophen (TYLENOL) 500 MG tablet Take 2 tablets (1,000 mg total) by mouth every 6 (six) hours as needed for mild pain.     amLODipine (NORVASC) 10 MG tablet Take 1 tablet (10 mg total) by mouth daily. 30 tablet 0   atorvastatin (LIPITOR) 80 MG tablet Take 80 mg by mouth daily.     bumetanide (BUMEX) 1 MG tablet Take 1 mg by mouth 2 (two) times daily.     furosemide (LASIX) 80 MG tablet Take 80 mg by mouth daily.     HUMALOG 100 UNIT/ML injection Inject 0-1 mLs (0-100 Units total) into the skin daily. Uses with Insulin Pump 10 mL 11   losartan (COZAAR) 100 MG tablet Take 100 mg by mouth daily.     montelukast (SINGULAIR) 10 MG tablet Take 10 mg by mouth at bedtime.     norethindrone-ethinyl estradiol (LOESTRIN) 1-20 MG-MCG tablet Take 1 tablet by mouth daily.     ondansetron (ZOFRAN) 8 MG tablet Take 8 mg by mouth daily.     sevelamer carbonate (RENVELA) 800 MG tablet Take 800-1,600 mg by mouth See admin instructions. Take 2 tablets (1600mg ) by mouth three times daily with meals and take 1 tablet (800mg ) by mouth daily with snacks     SUMAtriptan (IMITREX) 50 MG tablet Take 50 mg by mouth daily as needed for migraine.     potassium chloride (KLOR-CON M) 10 MEQ tablet Take 1 tablet (10 mEq total) by mouth 2 (two) times daily for 7 days. 14 tablet 0   pregabalin (LYRICA) 25 MG capsule Take 1 capsule (25 mg total) by mouth 2 (two) times daily. (Patient not taking: Reported on 06/18/2023) 60 capsule 0    Physical Exam: General: Alert and oriented.  No apparent distress.  Vascular: DP/PT pulses palpable bil, CFT intact to digits both.  Neuro: Light touch sensation reduced to BLE  Derm incision site to the left partial first ray amputation appears to be well coapted with sutures intact, reduced erythema and edema present overall, no drainage present today.   Appears to be in very stable condition.    MSK: L partial 1st ray amputation   Results for orders placed or performed during the hospital encounter of 06/18/23 (from the past 48 hour(s))  Comprehensive metabolic panel     Status: Abnormal   Collection Time: 06/18/23 12:31 PM  Result Value Ref Range   Sodium 133 (L) 135 - 145 mmol/L   Potassium 4.2 3.5 - 5.1 mmol/L   Chloride 95 (L) 98 - 111 mmol/L   CO2 23 22 - 32 mmol/L   Glucose, Bld 93 70 - 99 mg/dL    Comment: Glucose reference range applies only to samples taken after fasting for at least 8 hours.   BUN 63 (H) 6 - 20 mg/dL   Creatinine, Ser 1.61 (H) 0.44 - 1.00 mg/dL   Calcium 8.0 (L) 8.9 - 10.3 mg/dL  Total Protein 7.7 6.5 - 8.1 g/dL   Albumin 3.3 (L) 3.5 - 5.0 g/dL   AST 17 15 - 41 U/L   ALT 13 0 - 44 U/L   Alkaline Phosphatase 55 38 - 126 U/L   Total Bilirubin 0.6 0.3 - 1.2 mg/dL   GFR, Estimated 5 (L) >60 mL/min    Comment: (NOTE) Calculated using the CKD-EPI Creatinine Equation (2021)    Anion gap 15 5 - 15    Comment: Performed at [facility], [address] [place], [place] [address]  CBC     Status: Abnormal   Collection Time: 06/18/23 12:35 PM  Result Value Ref Range   WBC 9.3 4.0 - 10.5 K/uL   RBC 3.41 (L) 3.87 - 5.11 MIL/uL   Hemoglobin 10.2 (L) 12.0 - 15.0 g/dL   HCT 60.4 (L) 54.0 - 98.1 %   MCV 89.4 80.0 - 100.0 fL   MCH 29.9 26.0 - 34.0 pg   MCHC 33.4 30.0 - 36.0 g/dL   RDW 19.1 47.8 - 29.5 %   Platelets 308 150 - 400 K/uL   nRBC 0.0 0.0 - 0.2 %    Comment: Performed at [facility], [address] [place], [place] [address]  Hemoglobin A1c     Status: Abnormal   Collection Time: 06/18/23 12:35 PM  Result Value Ref Range   Hgb A1c MFr Bld 8.5 (H) 4.8 - 5.6 %    Comment: (NOTE) Pre diabetes:          5.7%-6.4%  Diabetes:              >6.4%  Glycemic control for   <7.0% adults with diabetes    Mean Plasma Glucose 197.25 mg/dL    Comment: Performed at [facility], [address], [place], [place] [address]  HIV Antibody (routine testing w rflx)     Status: None   Collection Time: 06/18/23  1:34 PM  Result Value Ref Range   HIV Screen 4th Generation wRfx Non Reactive Non Reactive    Comment: Performed at [facility], [address], [place], [place] [address]  Sedimentation rate     Status: Abnormal   Collection Time: 06/18/23  1:34 PM  Result Value Ref Range   Sed Rate 118 (H) 0 - 20 mm/hr    Comment: Performed at [facility], [address] [place], [place] [address]  C-reactive protein     Status: Abnormal   Collection Time: 06/18/23  1:35 PM  Result Value Ref Range   CRP 12.9 (H) <1.0 mg/dL    Comment: Performed at [facility], [address], [place], [place] [address]  Prealbumin     Status: None   Collection Time: 06/18/23  1:35 PM  Result Value Ref Range   Prealbumin 20 18 - 38 mg/dL    Comment: Performed at [facility], [address] [place], [place] [address]  Culture, blood (Routine X 2) w Reflex to ID Panel     Status: None (Preliminary result)   Collection Time: 06/18/23  3:11 PM   Specimen: BLOOD LEFT ARM  Result Value Ref Range   Specimen Description BLOOD LEFT ARM    Special Requests      BOTTLES DRAWN AEROBIC AND ANAEROBIC Blood Culture adequate volume   Culture      NO GROWTH 2 DAYS Performed at [facility], [address], [place], [place] [address]  Report Status PENDING   Hepatitis B surface antigen     Status: None   Collection Time: 06/18/23  3:11 PM  Result Value Ref Range   Hepatitis B Surface Ag NON REACTIVE NON REACTIVE    Comment: Performed at [facility], [address] [place], [place] [address]  Culture, blood (Routine X 2) w Reflex to ID Panel     Status: None (Preliminary result)   Collection Time: 06/18/23  3:16 PM   Specimen: BLOOD LEFT HAND  Result Value Ref Range   Specimen Description BLOOD LEFT HAND    Special Requests      BOTTLES  DRAWN AEROBIC AND ANAEROBIC Blood Culture adequate volume   Culture      NO GROWTH 2 DAYS Performed at [facility], [address], [place], [place] [address]    Report Status PENDING   Glucose, capillary     Status: Abnormal   Collection Time: 06/18/23  4:01 PM  Result Value Ref Range   Glucose-Capillary 142 (H) 70 - 99 mg/dL    Comment: Glucose reference range applies only to samples taken after fasting for at least 8 hours.  Glucose, capillary     Status: Abnormal   Collection Time: 06/18/23  9:08 PM  Result Value Ref Range   Glucose-Capillary 374 (H) 70 - 99 mg/dL    Comment: Glucose reference range applies only to samples taken after fasting for at least 8 hours.  Glucose, capillary     Status: Abnormal   Collection Time: 06/18/23 10:56 PM  Result Value Ref Range   Glucose-Capillary 417 (H) 70 - 99 mg/dL    Comment: Glucose reference range applies only to samples taken after fasting for at least 8 hours.  Glucose, capillary     Status: Abnormal   Collection Time: 06/19/23  2:59 AM  Result Value Ref Range   Glucose-Capillary 341 (H) 70 - 99 mg/dL    Comment: Glucose reference range applies only to samples taken after fasting for at least 8 hours.  Pregnancy, urine     Status: None   Collection Time: 06/19/23  3:08 AM  Result Value Ref Range   Preg Test, Ur NEGATIVE NEGATIVE    Comment:        THE SENSITIVITY OF THIS METHODOLOGY IS >25 mIU/mL. Performed at [facility], [address] [place], [place] [address]   CBC     Status: Abnormal   Collection Time: 06/19/23  4:37 AM  Result Value Ref Range   WBC 5.8 4.0 - 10.5 K/uL   RBC 3.24 (L) 3.87 - 5.11 MIL/uL   Hemoglobin 9.6 (L) 12.0 - 15.0 g/dL   HCT 13.0 (L) 86.5 - 78.4 %   MCV 92.3 80.0 - 100.0 fL   MCH 29.6 26.0 - 34.0 pg   MCHC 32.1 30.0 - 36.0 g/dL   RDW 69.6 29.5 - 28.4 %   Platelets 297 150 - 400 K/uL   nRBC 0.0 0.0 - 0.2 %    Comment: Performed at [facility], [address] [place], [place] [address]  Comprehensive metabolic panel     Status: Abnormal   Collection Time: 06/19/23  4:37 AM  Result Value Ref Range   Sodium 129 (L) 135 - 145 mmol/L    Comment: ELECTROLYTES REPEATED TO VERIFY.PMF   Potassium 4.6 3.5 - 5.1 mmol/L   Chloride 93 (L) 98 - 111 mmol/L   CO2 14 (L) 22 - 32 mmol/L  Glucose, Bld 404 (H) 70 - 99 mg/dL    Comment: Glucose reference range applies only to samples taken after fasting for at least 8 hours.   BUN 72 (H) 6 - 20 mg/dL   Creatinine, Ser 54.09 (H) 0.44 - 1.00 mg/dL   Calcium 7.8 (L) 8.9 - 10.3 mg/dL   Total Protein 7.0 6.5 - 8.1 g/dL   Albumin 2.9 (L) 3.5 - 5.0 g/dL   AST 11 (L) 15 - 41 U/L   ALT 11 0 - 44 U/L   Alkaline Phosphatase 51 38 - 126 U/L   Total Bilirubin 1.4 (H) 0.3 - 1.2 mg/dL   GFR, Estimated 5 (L) >60 mL/min    Comment: (NOTE) Calculated using the CKD-EPI Creatinine Equation (2021)    Anion gap 22 (H) 5 - 15    Comment: Performed at [facility], [address] [place], [place] [address]  Glucose, capillary     Status: Abnormal   Collection Time: 06/19/23  5:36 AM  Result Value Ref Range   Glucose-Capillary 339 (H) 70 - 99 mg/dL    Comment: Glucose reference range applies only to samples taken after fasting for at least 8 hours.  Aerobic/Anaerobic Culture w Gram Stain (surgical/deep wound)     Status: None (Preliminary result)   Collection Time: 06/19/23  7:31 AM   Specimen: PATH Soft tissue  Result Value Ref Range   Specimen Description      TISSUE SOFT LEFT FOOT Performed at [facility], [address] [place], [place] [address]    Special Requests      NONE Performed at [facility], [address] [place], [place] [address]    Gram Stain      RARE WBC SEEN NO ORGANISMS SEEN Performed at [facility], [address] [place], [place] [address]    Culture PENDING    Report Status PENDING   Aerobic/Anaerobic Culture w Gram Stain (surgical/deep wound)      Status: None (Preliminary result)   Collection Time: 06/19/23  7:32 AM   Specimen: PATH Bone resection; Tissue  Result Value Ref Range   Specimen Description      BONE RESECTION Performed at [facility], [address], [place], [place] [address]    Special Requests      NONE Performed at [facility], [address] [place], [place] [address]    Gram Stain      RARE WBC SEEN NO ORGANISMS SEEN Performed at [facility], [address], [place], [place] [address]    Culture PENDING    Report Status PENDING   Glucose, capillary     Status: Abnormal   Collection Time: 06/19/23  8:55 AM  Result Value Ref Range   Glucose-Capillary 263 (H) 70 - 99 mg/dL    Comment: Glucose reference range applies only to samples taken after fasting for at least 8 hours.  Vitamin B12     Status: None   Collection Time: 06/19/23 10:05 AM  Result Value Ref Range   Vitamin B-12 600 180 - 914 pg/mL    Comment: (NOTE) This assay is not validated for testing neonatal or myeloproliferative syndrome specimens for Vitamin B12 levels. Performed at [facility], [address], [place], [place] [address]   VITAMIN D 25 Hydroxy (Vit-D Deficiency, Fractures)     Status: Abnormal   Collection Time: 06/19/23 10:05 AM  Result Value Ref Range   Vit D, 25-Hydroxy 20.67 (  L) 30 - 100 ng/mL    Comment: (NOTE) Vitamin D deficiency has been defined by the Institute of Medicine  and an Endocrine Society practice guideline as a level of serum 25-OH  vitamin D less than 20 ng/mL (1,2). The Endocrine Society went on to  further define vitamin D insufficiency as a level between 21 and 29  ng/mL (2).  1. IOM (Institute of Medicine). 2010. Dietary reference intakes for  calcium and D. [place]: [employer]. 2. Holick MF, Binkley Berry, Bischoff-Ferrari HA, et al. Evaluation,  treatment, and prevention of vitamin D deficiency: an Endocrine  Society clinical practice  guideline, JCEM. 2011 Jul; 96(7): 1911-30.  Performed at [facility], [address], [place], [place] [address]   Iron and TIBC     Status: None   Collection Time: 06/19/23 10:05 AM  Result Value Ref Range   Iron 82 28 - 170 ug/dL   TIBC 528 413 - 244 ug/dL   Saturation Ratios 29 10.4 - 31.8 %   UIBC 199 ug/dL    Comment: Performed at [facility], [address] [place], [place] [address]  Folate     Status: None   Collection Time: 06/19/23 10:05 AM  Result Value Ref Range   Folate 33.0 >5.9 ng/mL    Comment: Performed at [facility], [address] [place], [place] [address]  Glucose, capillary     Status: Abnormal   Collection Time: 06/19/23 12:28 PM  Result Value Ref Range   Glucose-Capillary 218 (H) 70 - 99 mg/dL    Comment: Glucose reference range applies only to samples taken after fasting for at least 8 hours.  Glucose, capillary     Status: Abnormal   Collection Time: 06/19/23  4:43 PM  Result Value Ref Range   Glucose-Capillary 331 (H) 70 - 99 mg/dL    Comment: Glucose reference range applies only to samples taken after fasting for at least 8 hours.   Comment 1 Notify RN    Comment 2 Document in Chart   Glucose, capillary     Status: Abnormal   Collection Time: 06/19/23  9:18 PM  Result Value Ref Range   Glucose-Capillary 462 (H) 70 - 99 mg/dL    Comment: Glucose reference range applies only to samples taken after fasting for at least 8 hours.  Glucose, capillary     Status: Abnormal   Collection Time: 06/20/23  1:05 AM  Result Value Ref Range   Glucose-Capillary 478 (H) 70 - 99 mg/dL    Comment: Glucose reference range applies only to samples taken after fasting for at least 8 hours.  Glucose, capillary     Status: Abnormal   Collection Time: 06/20/23  3:02 AM  Result Value Ref Range   Glucose-Capillary 415 (H) 70 - 99 mg/dL    Comment: Glucose reference range applies only to samples taken after fasting for at least 8 hours.   Glucose, capillary     Status: Abnormal   Collection Time: 06/20/23  4:07 AM  Result Value Ref Range   Glucose-Capillary 468 (H) 70 - 99 mg/dL    Comment: Glucose reference range applies only to samples taken after fasting for at least 8 hours.  Basic metabolic panel     Status: Abnormal   Collection Time: 06/20/23  4:14 AM  Result Value Ref Range   Sodium 129 (L) 135 - 145 mmol/L   Potassium 4.2 3.5 - 5.1 mmol/L   Chloride 91 (  L) 98 - 111 mmol/L   CO2 22 22 - 32 mmol/L   Glucose, Bld 476 (H) 70 - 99 mg/dL    Comment: Glucose reference range applies only to samples taken after fasting for at least 8 hours.   BUN 48 (H) 6 - 20 mg/dL   Creatinine, Ser 3.24 (H) 0.44 - 1.00 mg/dL   Calcium 7.9 (L) 8.9 - 10.3 mg/dL   GFR, Estimated 8 (L) >60 mL/min    Comment: (NOTE) Calculated using the CKD-EPI Creatinine Equation (2021)    Anion gap 16 (H) 5 - 15    Comment: Performed at [facility], [address] [place], [place] [address]  CBC     Status: Abnormal   Collection Time: 06/20/23  4:14 AM  Result Value Ref Range   WBC 7.2 4.0 - 10.5 K/uL   RBC 3.60 (L) 3.87 - 5.11 MIL/uL   Hemoglobin 10.6 (L) 12.0 - 15.0 g/dL   HCT 72.5 (L) 36.6 - 44.0 %   MCV 89.4 80.0 - 100.0 fL   MCH 29.4 26.0 - 34.0 pg   MCHC 32.9 30.0 - 36.0 g/dL   RDW 34.7 42.5 - 95.6 %   Platelets 350 150 - 400 K/uL   nRBC 0.0 0.0 - 0.2 %    Comment: Performed at [facility], [address] [place], [place] [address]  Magnesium     Status: Abnormal   Collection Time: 06/20/23  4:14 AM  Result Value Ref Range   Magnesium 2.5 (H) 1.7 - 2.4 mg/dL    Comment: Performed at [facility], [address] [place], [place] [address]  Phosphorus     Status: Abnormal   Collection Time: 06/20/23  4:14 AM  Result Value Ref Range   Phosphorus 7.3 (H) 2.5 - 4.6 mg/dL    Comment: Performed at [facility], [address] [place], [place] [address]  Glucose, capillary     Status: Abnormal    Collection Time: 06/20/23  7:43 AM  Result Value Ref Range   Glucose-Capillary 496 (H) 70 - 99 mg/dL    Comment: Glucose reference range applies only to samples taken after fasting for at least 8 hours.   DG Foot Complete Left  Result Date: 06/19/2023 CLINICAL DATA:  Left foot infection. Status post left foot first metatarsal amputation. EXAM: LEFT FOOT - COMPLETE 3+ VIEW COMPARISON:  Left foot radiographs 11/20/2022, MRI left forefoot 11/20/2022, MRI left forefoot 06/18/2023 FINDINGS: Interval repeat amputation of the first ray, now to the far proximal aspect of the first metatarsal. There are antibiotic beads seen within the amputation space. Expected postoperative air within the amputation space. 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 Mild to moderate navicular-medial cuneiform joint space narrowing, subchondral sclerosis, peripheral osteophytosis. IMPRESSION: Interval repeat amputation of the first ray, now to the far proximal aspect of the first metatarsal. Expected postoperative changes. Electronically Signed   By: Neita Garnet M.D.   On: 06/19/2023 17:20   MR FOOT LEFT WO CONTRAST  Result Date: 06/18/2023 CLINICAL DATA:  Left foot infection.  History of prior amputation. EXAM: MRI OF THE LEFT FOOT WITHOUT CONTRAST TECHNIQUE: Multiplanar, multisequence MR imaging of the left forefoot was performed. No intravenous contrast was administered. COMPARISON:  MRI left foot and left foot x-rays dated November 20, 2022. FINDINGS: Bones/Joint/Cartilage Prior first ray amputation. Abnormal marrow edema with corresponding decreased T1 marrow signal involving  the distal residual first metatarsal. 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 No fracture or dislocation. No joint effusion. Ligaments Second through fifth toe collateral ligaments are intact.  Lisfranc ligament is intact. Muscles and Tendons Second through fifth flexor and extensor tendons are intact. No tenosynovitis. Increased T2 signal within the intrinsic muscles of the forefoot, nonspecific, but likely related to diabetic muscle changes. Soft tissue 4.8 x 2.1 x 2.9 cm complex fluid collection at the first ray amputation site (series 10, image 14), with two sinus tracts draining to the skin surface (series 7, images 16 and 29). No soft tissue mass. IMPRESSION: 1. 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 2. 4.8 x 2.1 x 2.9 cm abscess at the first ray amputation site. 3. 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 Electronically Signed   By: Obie Dredge M.D.   On: 06/18/2023 15:08    Blood pressure 139/89, pulse 92, temperature 97.8 F (36.6 C), resp. rate 18, height 5\' 6"  (1.676 m), weight 72.6 kg, SpO2 99%.  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  As expected. -Incision site today appears to be well coapted with sutures intact, reduced erythema and edema overall.  Appears to be greatly improved.  Stable condition at this time. -Reapplied Xeroform gauze to the area followed by 4 x 4 gauze, ABD, Kerlix, Ace wrap. -Patient should remain partial weightbearing with heel contact and postop shoe.  Postop shoe has been ordered as well as physical therapy to work with patient on partial weightbearing.  Do not believe heel wedge shoe is necessary.  If needed could give in clinic. -Patient should not be on her foot for long distances and although she keep it short distances here and there. -Appreciate medicine recommendations for antibiotic therapy.  Culture taken intraoperatively with no bacterial growth at this time.  White blood cell count within normal limits.  Recommend de-escalating  antibiotic coverage to staph and strep bacteria.  Patient may be discharged on Augmentin 7-day course.  Will follow-up with cultures on an outpatient basis as well as path report. -Patient is to keep dressings clean, dry, and intact until postoperative visit.  Patient should make it within 1 week of discharge date.  Podiatry team to sign off at this time.  Rosetta Posner, DPM 06/20/2023, 8:48 AM

## 2023-06-20 NOTE — Progress Notes (Signed)
[facility] Progress Note  Patient: Emma Thomas    NUU:[id number]  DOA: 06/18/2023     Date of Service: the patient was seen and examined on 06/20/2023  Chief Complaint  Patient presents with   Foot Swelling   Brief hospital course: DIOR DOMINIK is a 27 y.o. female with medical history significant of type 1 diabetes with insulin pump in place, end-stage renal disease on hemodialysis at home daily, hypertension, anemia of chronic disease, history of diabetic foot infection status post amputations of the great toes bilaterally presenting with diabetic foot infection.  Patient noted to be followed by Dr. Excell Seltzer with podiatry outpatient.  Per report, patient with worsening redness pain and swelling.  Positive generalized malaise at home over multiple days.  Patient reports blood sugars have been poorly controlled at home.  No fevers or chills.  No nausea or vomiting.  No chest pain or shortness of breath.  No abdominal pain.  Was evaluated at Dr. Bernette Redbird office today and the area of the left foot at the base of the first and second digits which have been previously amputated.  Worsening redness and pain with concern for infection.  Patient redirected to the ER for imaging and likely operative evaluation. Presented to the ER afebrile, hemodynamically stable.  White count 9.3, hemoglobin 10.2, creatinine 9.6.  Pending MRI of the left foot.  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 Follow culture from the OR and may need ID consult if any resistance organism.   # ESRD on HD Nephrology consulted for  hemodialysis  # Diastolic dysfunction 2D echo 08/05/2022 with EF 55 to 60% and grade 1 diastolic dysfunction Monitor volume status in the setting ESRD Appears fairly euvolemic    # 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 (HCC) A1c 8.5 Patient is on insulin pump at home, does not know how much dose she takes. Patient was given Semglee 12 units and NovoLog sliding scale. 8/4 increased Semglee 25 units nightly 8/4 started insulin IV infusion as CBGs persistently high    # Hypertension Resumed amlodipine 5 mg p.o. daily Monitor BP and titrate medication accordingly   # Anemia of chronic disease, continue to monitor H&H Iron profile within normal range, folate and B12 WNL Vitamin D deficiency: started vitamin D 50,000 units p.o. weekly, follow with PCP to repeat vitamin D level after 3 to 6 months.   Body mass index is 25.83 kg/m.  Nutrition Problem: Increased nutrient needs Etiology: wound healing Interventions: Interventions: MVI, Juven, Liberalize Diet   Diet: Carb modified diet DVT Prophylaxis: Subcutaneous Heparin    Advance goals of care discussion: Full code  Family Communication: family was present at bedside, at the time of interview.  The pt provided permission to discuss medical plan with the family. Opportunity was given to ask question and all questions were answered satisfactorily.   Disposition:  Pt is from Home, admitted with left first metatarsal osteomyelitis, s/p resection, on IV antibiotics, awaiting for cultures, still has very high blood glucose, which precludes a safe discharge. Discharge to Home, when stable and cleared by podiatry.  Subjective: No significant events overnight, patient was complaining of pain in the left  foot to 9/10 but it is well-controlled with pain medications.  Denies any chest pain or palpitation no shortness of breath.  Patient understands that her blood sugar is very high so she needs to stay in the  hospital.   Physical Exam: General: NAD, lying comfortably Appear in no distress, affect appropriate Eyes: PERRLA ENT: Oral Mucosa Clear, moist  Neck: no JVD,  Cardiovascular: S1 and S2 Present, no Murmur,  Respiratory: good respiratory effort, Bilateral Air entry equal and Decreased, no Crackles, no wheezes Abdomen: Bowel Sound present, Soft and no tenderness,  Skin: no rashes Extremities: no Pedal edema, no calf tenderness, left foot dressing CDI Neurologic: without any new focal findings Gait not checked due to patient safety concerns  Vitals:   06/19/23 1932 06/20/23 0400 06/20/23 0744 06/20/23 0941  BP: (!) 139/93 124/73 139/89 (!) 141/91  Pulse: 90 91 92   Resp: 20 20 18    Temp: 98.2 F (36.8 C) 98.6 F (37 C) 97.8 F (36.6 C)   TempSrc: Oral Oral    SpO2: 100% 98% 99%   Weight:      Height:        Intake/Output Summary (Last 24 hours) at 06/20/2023 1318 Last data filed at 06/20/2023 1032 Gross per 24 hour  Intake 139.28 ml  Output 1700 ml  Net -1560.72 ml   Filed Weights   06/18/23 1227 06/19/23 1046 06/19/23 1455  Weight: 68 kg 74.1 kg 72.6 kg    Data Reviewed: I have personally reviewed and interpreted daily labs, tele strips, imagings as discussed above. I reviewed all nursing notes, pharmacy notes, vitals, pertinent old records I have discussed plan of care as described above with RN and patient/family.  CBC: Recent Labs  Lab 06/18/23 1235 06/19/23 0437 06/20/23 0414  WBC 9.3 5.8 7.2  HGB 10.2* 9.6* 10.6*  HCT 30.5* 29.9* 32.2*  MCV 89.4 92.3 89.4  PLT 308 297 350   Basic Metabolic Panel: Recent Labs  Lab 06/18/23 1231 06/19/23 0437 06/20/23 0414  NA 133* 129* 129*  K 4.2 4.6 4.2  CL 95* 93* 91*  CO2 23 14* 22  GLUCOSE 93 404* 476*  BUN 63* 72* 48*  CREATININE 9.58* 10.14* 6.60*  CALCIUM 8.0* 7.8* 7.9*  MG  --   --  2.5*  PHOS  --   --  7.3*    Studies: DG Foot Complete Left  Result Date: 06/19/2023 CLINICAL DATA:  Left foot  infection. Status post left foot first metatarsal amputation. EXAM: LEFT FOOT - COMPLETE 3+ VIEW COMPARISON:  Left foot radiographs 11/20/2022, MRI left forefoot 11/20/2022, MRI left forefoot 06/18/2023 FINDINGS: Interval repeat amputation of the first ray, now to the far proximal aspect of the first metatarsal. There are antibiotic beads seen within the amputation space. Expected postoperative air within the amputation space. 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 Mild to moderate navicular-medial cuneiform joint space narrowing, subchondral sclerosis, peripheral osteophytosis. IMPRESSION: Interval repeat amputation of the first ray, now to the far proximal aspect of the first metatarsal. Expected postoperative changes. Electronically Signed   By: Neita Garnet M.D.   On: 06/19/2023 17:20    Scheduled Meds:  amLODipine  5 mg Oral Daily   calcium acetate  667 mg Oral TID WC   Chlorhexidine Gluconate Cloth  6 each Topical Q0600   heparin  5,000 Units Subcutaneous Q8H   insulin aspart  0-6 Units Subcutaneous TID  AC & HS   insulin glargine-yfgn  25 Units Subcutaneous QHS   multivitamin  1 tablet Oral QHS   nutrition supplement (JUVEN)  1 packet Oral BID BM   sodium chloride flush  3 mL Intravenous Q12H   vancomycin variable dose per unstable renal function (pharmacist dosing)   Does not apply See admin instructions   Vitamin D (Ergocalciferol)  50,000 Units Oral Q7 days   Continuous Infusions:  sodium chloride Stopped (06/19/23 0104)   anticoagulant sodium citrate     cefTRIAXone (ROCEPHIN)  IV 2 g (06/19/23 2200)   insulin     metronidazole 500 mg (06/20/23 0948)   PRN Meds: sodium chloride, alteplase, anticoagulant sodium citrate, dextrose, heparin, HYDROmorphone (DILAUDID) injection, lidocaine (PF), lidocaine-prilocaine, ondansetron **OR** ondansetron (ZOFRAN) IV,  pentafluoroprop-tetrafluoroeth, sodium chloride flush  Time spent: 35 minutes  Author: Gillis Santa. MD [facility] 06/20/2023 1:18 PM  To reach On-call, see care teams to locate the attending and reach out to them via [email or web address]. If 7PM-7AM, please contact night-coverage If you still have difficulty reaching the attending provider, please page the [facility] (Director on Call) for [facility] on amion for assistance.

## 2023-06-20 NOTE — Plan of Care (Signed)

## 2023-06-20 NOTE — Inpatient Diabetes Management (Addendum)
Inpatient Diabetes Program Recommendations  AACE/ADA: New Consensus Statement on Inpatient Glycemic Control (2015)  Target Ranges:  Prepandial:   less than 140 mg/dL      Peak postprandial:   less than 180 mg/dL (1-2 hours)      Critically ill patients:  140 - 180 mg/dL   Lab Results  Component Value Date   GLUCAP 529 (HH) 06/20/2023   HGBA1C 8.5 (H) 06/18/2023    Review of Glycemic Control  Latest Reference Range & Units 06/20/23 01:05 06/20/23 03:02 06/20/23 04:07 06/20/23 07:43 06/20/23 09:24  Glucose-Capillary 70 - 99 mg/dL 409 (H) 811 (H) 914 (H) 496 (H) 529 (HH)  (HH): Data is critically high (H): Data is abnormally high  Latest Reference Range & Units 06/20/23 04:14  Anion gap 5 - 15  16 (H)  (H): Data is abnormally high  Diabetes history: T1DM Outpatient Diabetes medications:  Insulin pump Current orders for Inpatient glycemic control:  Semglee 25 units QHS Novolog 0-6 units TID and HS  Inpatient Diabetes Program Recommendations:    Given AG of 16 and glucose of 529, consider IV insulin.    If MD chooses not to start IV insulin, please consider:  Semglee 12 units BID (received 12 units of semglee last evening), could administer again this morning.  Also please consider Novolog 0-9 units Q4H Novolog 3 units TID with meals if eats at least 50%  Will continue to follow while inpatient.  Thank you, Dulce Sellar, MSN, CDCES Diabetes Coordinator Inpatient Diabetes Program [phone] (team pager from 8a-5p)

## 2023-06-20 NOTE — Evaluation (Signed)
Physical Therapy Evaluation Patient Details Name: Emma Thomas MRN: [id number] DOB: May 25, 1996 Today's Date: 06/20/2023  History of Present Illness  Pt is a 27 y.o. female presenting to hospital 06/18/23 from podiatry clinic d/t concern for osteomyelitis cellulitis and diabetic foot wound.  PMH includes DM with insulin pump, ESRD on HD at home daily, htn, anemia of chronic disease, h/o diabetic foot infections s/p amputations of great toe.  Pt admitted with diabetic foot infection, CKD, diastolic dysfunction, DM, and htn.  S/p L partial first ray amputation 06/19/23.  PMH includes anemia, DM, htn, HA, neurologic disorder B feet, recurrent UTI, L great toe amputation.  Clinical Impression  Prior to hospital admission, pt was independent with functional mobility; lives with her husband and 3 y.o. daughter in 2nd floor apt ([address] L railing).  Pt utilized post op shoe during session.  Currently pt is modified independent with bed mobility; SBA with transfers; and SBA with short distance ambulation within room with B axillary crutch use (pt steady ambulating NWB'ing L LE and also PWB'ing L LE heel contact).  Pt would currently benefit from skilled PT to address noted impairments and functional limitations during hospitalization (see below for any additional details).    If plan is discharge home, recommend the following: A little help with walking and/or transfers;A little help with bathing/dressing/bathroom;Assistance with cooking/housework;Assist for transportation;Help with stairs or ramp for entrance   Can travel by private vehicle    Yes    Equipment Recommendations Crutches  Recommendations for Other Services       Functional Status Assessment Patient has had a recent decline in their functional status and demonstrates the ability to make significant improvements in function in a reasonable and predictable amount of time.     Precautions / Restrictions Restrictions Weight Bearing  Restrictions: Yes LLE Weight Bearing: Partial weight bearing Other Position/Activity Restrictions: Heel contact in post op shoe (short distances)      Mobility  Bed Mobility Overal bed mobility: Modified Independent             General bed mobility comments: Supine to/from sitting without any noted difficulties    Transfers   Equipment used: Crutches Transfers: Sit to/from Stand Sit to Stand: Supervision           General transfer comment: steady transfer from bed x3 trials    Ambulation/Gait Ambulation/Gait assistance: Supervision Gait Distance (Feet):  (30 feet NWB'ing L LE; 15 feet x2 (to/from bathroom) NWB'ing L LE; 20 feet PWB'ing heel contact L LE) Assistive device: Crutches   Gait velocity: decreased     General Gait Details: pt NWB'ing L LE initial ambulation trials and PWB'ing heel contact L LE x1 ambulation trial; steady with B axillary crutch use  Stairs            Wheelchair Mobility     Tilt Bed    Modified Rankin (Stroke Patients Only)       Balance Overall balance assessment: Needs assistance Sitting-balance support: No upper extremity supported, Feet supported Sitting balance-Leahy Scale: Normal Sitting balance - Comments: steady reaching outside BOS   Standing balance support: Bilateral upper extremity supported, During functional activity, Reliant on assistive device for balance Standing balance-Leahy Scale: Good Standing balance comment: steady ambulating with B axillary crutches                             Pertinent Vitals/Pain Pain Assessment Pain Assessment: 0-10 Pain Score: 8  Pain Location: L foot Pain Descriptors / Indicators: Aching, Sore Pain Intervention(s): Limited activity within patient's tolerance, Monitored during session, Repositioned, Patient requesting pain meds-RN notified (pt requesting to wait for pain meds until after PT session--nurse notified of this and when PT session was finished)     Home Living Family/patient expects to be discharged to:: Private residence Living Arrangements: Spouse/significant other;Children (husband and 3 y.o. daughter) Available Help at Discharge: Family;Available PRN/intermittently Type of Home: Apartment (2nd floor) Home Access: Stairs to enter Entrance Stairs-Rails: Left Entrance Stairs-Number of Steps: 14   Home Layout: One level        Prior Function Prior Level of Function : Independent/Modified Independent             Mobility Comments: Independent ambulating without AD; no reports of falls ADLs Comments: Independent with ADL's     Hand Dominance        Extremity/Trunk Assessment   Upper Extremity Assessment Upper Extremity Assessment: Overall WFL for tasks assessed    Lower Extremity Assessment Lower Extremity Assessment: Overall WFL for tasks assessed    Cervical / Trunk Assessment Cervical / Trunk Assessment: Normal  Communication   Communication: No difficulties  Cognition Arousal/Alertness: Awake/alert Behavior During Therapy: WFL for tasks assessed/performed Overall Cognitive Status: Within Functional Limits for tasks assessed                                          General Comments General comments (skin integrity, edema, etc.): L LE foot dressing in place (no drainage noted on dressing).  Nursing cleared pt for participation in physical therapy.  Pt agreeable to PT session.    Exercises     Assessment/Plan    PT Assessment Patient needs continued PT services  PT Problem List Decreased strength;Decreased mobility;Decreased knowledge of use of DME;Decreased knowledge of precautions;Decreased skin integrity;Pain       PT Treatment Interventions DME instruction;Gait training;Stair training;Functional mobility training;Therapeutic activities;Therapeutic exercise;Balance training;Patient/family education    PT Goals (Current goals can be found in the Care Plan section)  Acute  Rehab PT Goals Patient Stated Goal: to improve mobility PT Goal Formulation: With patient Time For Goal Achievement: 07/04/23 Potential to Achieve Goals: Good    Frequency Min 1X/week     Co-evaluation               AM-PAC PT "6 Clicks" Mobility  Outcome Measure Help needed turning from your back to your side while in a flat bed without using bedrails?: None Help needed moving from lying on your back to sitting on the side of a flat bed without using bedrails?: None Help needed moving to and from a bed to a chair (including a wheelchair)?: A Little Help needed standing up from a chair using your arms (e.g., wheelchair or bedside chair)?: A Little Help needed to walk in hospital room?: A Little Help needed climbing 3-5 steps with a railing? : A Little 6 Click Score: 20    End of Session   Activity Tolerance: Patient tolerated treatment well Patient left: in bed;with call bell/phone within reach Nurse Communication: Mobility status;Patient requests pain meds;Precautions;Weight bearing status;Other (comment) (pt's post op shoe) PT Visit Diagnosis: Other abnormalities of gait and mobility (R26.89);Pain Pain - Right/Left: Left Pain - part of body: Ankle and joints of foot    Time: 1610-9604 PT Time Calculation (min) (ACUTE ONLY): 33 min  Charges:   PT Evaluation $PT Eval Low Complexity: 1 Low PT Treatments $Gait Training: 8-22 mins PT General Charges $$ ACUTE PT VISIT: 1 Visit        Hendricks Limes, PT 06/20/23, 12:51 PM

## 2023-06-20 NOTE — Discharge Instructions (Signed)
Keep dressings to the left foot clean, dry, and intact until postoperative visit.  Remain partial weightbearing at all times left lower extremity with surgical shoe for short distances with heel contact only.  You should not be on your foot for long periods of time.  If dressing does become wet or saturated or loose then you may remove the dressing.  Recommend applying nonadherent such as Adaptic or Xeroform directly to the incision followed by 4 x 4 gauze, ABD pad, Kerlix, Ace wrap with mild compression.

## 2023-06-20 NOTE — Progress Notes (Signed)
[facility]  PROGRESS NOTE   Subjective:   Patient seen at bedside.  Feels better this morning. Had stable dialysis yesterday.  Objective:  Vital signs: Blood pressure (!) 141/91, pulse 92, temperature 97.8 F (36.6 C), resp. rate 18, height 5\' 6"  (1.676 m), weight 72.6 kg, SpO2 99%.  Intake/Output Summary (Last 24 hours) at 06/20/2023 1217 Last data filed at 06/20/2023 1032 Gross per 24 hour  Intake 139.28 ml  Output 1700 ml  Net -1560.72 ml   Filed Weights   06/18/23 1227 06/19/23 1046 06/19/23 1455  Weight: 68 kg 74.1 kg 72.6 kg     Physical Exam: General:  No acute distress  Head:  Normocephalic, atraumatic. Moist oral mucosal membranes  Eyes:  Anicteric  Neck:  Supple  Lungs:   Clear to auscultation, normal effort  Heart:  S1S2 no rubs  Abdomen:   Soft, nontender, bowel sounds present  Extremities:  peripheral edema.  Neurologic:  Awake, alert, following commands  Skin:  No lesions  Access:     Basic Metabolic Panel: Recent Labs  Lab 06/18/23 1231 06/19/23 0437 06/20/23 0414  NA 133* 129* 129*  K 4.2 4.6 4.2  CL 95* 93* 91*  CO2 23 14* 22  GLUCOSE 93 404* 476*  BUN 63* 72* 48*  CREATININE 9.58* 10.14* 6.60*  CALCIUM 8.0* 7.8* 7.9*  MG  --   --  2.5*  PHOS  --   --  7.3*   GFR: Estimated Creatinine Clearance: 12.9 mL/min (A) (by C-G formula based on SCr of 6.6 mg/dL (H)).  Liver Function Tests: Recent Labs  Lab 06/18/23 1231 06/19/23 0437  AST 17 11*  ALT 13 11  ALKPHOS 55 51  BILITOT 0.6 1.4*  PROT 7.7 7.0  ALBUMIN 3.3* 2.9*   No results for input(s): "LIPASE", "AMYLASE" in the last 168 hours. No results for input(s): "AMMONIA" in the last 168 hours.  CBC: Recent Labs  Lab 06/18/23 1235 06/19/23 0437 06/20/23 0414  WBC 9.3 5.8 7.2  HGB 10.2* 9.6* 10.6*  HCT 30.5* 29.9* 32.2*  MCV 89.4 92.3 89.4  PLT 308 297 350     HbA1C: Hgb A1c MFr Bld  Date/Time Value Ref Range Status  06/18/2023 12:35 PM 8.5 (H) 4.8 - 5.6 %  Final    Comment:    (NOTE) Pre diabetes:          5.7%-6.4%  Diabetes:              >6.4%  Glycemic control for   <7.0% adults with diabetes   11/20/2022 07:49 PM 7.9 (H) 4.8 - 5.6 % Final    Comment:    (NOTE)         Prediabetes: 5.7 - 6.4         Diabetes: >6.4         Glycemic control for adults with diabetes: <7.0     Urinalysis: No results for input(s): "COLORURINE", "LABSPEC", "PHURINE", "GLUCOSEU", "HGBUR", "BILIRUBINUR", "KETONESUR", "PROTEINUR", "UROBILINOGEN", "NITRITE", "LEUKOCYTESUR" in the last 72 hours.  Invalid input(s): "APPERANCEUR"    Imaging: DG Foot Complete Left  Result Date: 06/19/2023 CLINICAL DATA:  Left foot infection. Status post left foot first metatarsal amputation. EXAM: LEFT FOOT - COMPLETE 3+ VIEW COMPARISON:  Left foot radiographs 11/20/2022, MRI left forefoot 11/20/2022, MRI left forefoot 06/18/2023 FINDINGS: Interval repeat amputation of the first ray, now to the far proximal aspect of the first metatarsal. There are antibiotic beads seen within the amputation space. Expected postoperative air  within the amputation space. 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 Mild to moderate navicular-medial cuneiform joint space narrowing, subchondral sclerosis, peripheral osteophytosis. IMPRESSION: Interval repeat amputation of the first ray, now to the far proximal aspect of the first metatarsal. Expected postoperative changes. Electronically Signed   By: Neita Garnet M.D.   On: 06/19/2023 17:20   MR FOOT LEFT WO CONTRAST  Result Date: 06/18/2023 CLINICAL DATA:  Left foot infection.  History of prior amputation. EXAM: MRI OF THE LEFT FOOT WITHOUT CONTRAST TECHNIQUE: Multiplanar, multisequence MR imaging of the left forefoot was performed. No intravenous contrast was administered. COMPARISON:  MRI left foot and left foot x-rays dated November 20, 2022. FINDINGS:  Bones/Joint/Cartilage Prior first ray amputation. Abnormal marrow edema with corresponding decreased T1 marrow signal involving the distal residual first metatarsal. 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 No fracture or dislocation. No joint effusion. Ligaments Second through fifth toe collateral ligaments are intact. Lisfranc ligament is intact. Muscles and Tendons Second through fifth flexor and extensor tendons are intact. No tenosynovitis. Increased T2 signal within the intrinsic muscles of the forefoot, nonspecific, but likely related to diabetic muscle changes. Soft tissue 4.8 x 2.1 x 2.9 cm complex fluid collection at the first ray amputation site (series 10, image 14), with two sinus tracts draining to the skin surface (series 7, images 16 and 29). No soft tissue mass. IMPRESSION: 1. 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 2. 4.8 x 2.1 x 2.9 cm abscess at the first ray amputation site. 3. 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 Electronically Signed   By: Obie Dredge M.D.   On: 06/18/2023 15:08     Medications:    sodium chloride Stopped (06/19/23 0104)   anticoagulant sodium citrate     cefTRIAXone (ROCEPHIN)  IV 2 g (06/19/23 2200)   insulin     metronidazole 500 mg (06/20/23 0948)    amLODipine  5 mg Oral Daily   calcium acetate  667 mg Oral TID WC   Chlorhexidine Gluconate Cloth  6 each Topical Q0600   heparin  5,000 Units Subcutaneous Q8H   insulin aspart  0-6 Units Subcutaneous TID AC & HS   insulin glargine-yfgn  25 Units Subcutaneous QHS   multivitamin  1 tablet Oral QHS   nutrition supplement (JUVEN)  1 packet Oral BID BM   sodium chloride flush  3 mL Intravenous Q12H   vancomycin variable dose per unstable renal function (pharmacist dosing)   Does not apply See admin instructions   Vitamin D (Ergocalciferol)  50,000 Units Oral Q7 days    Assessment/  Plan:     She is a 27 year old female with a past medical history of type 1 diabetes on insulin pump, hypertension, history of diabetic foot infections, end-stage renal disease on hemodialysis now admitted for foot infection.  She had toe amputation this morning. She denies any chest pain, shortness of breath or orthopnea.     ESRD: Will schedule for dialysis tomorrow again.   ANEMIA: Continue to follow anemia protocols.   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  Continue calcium acetate.   HTN/VOL: Continue amlodipine and advised her to stay on 2 g salt restricted diet.     Foot infection: Has been on Rocephin, metronidazole and vancomycin.  She had left foot partial  amputation podiatry note appreciated.  Diabetes: Continue insulin as ordered.   Labs and medications reviewed. Will continue to follow along with you.   LOS: 2 Lorain Childes, MD [facility] 8/4/202412:17 PM

## 2023-06-21 ENCOUNTER — Encounter: Payer: Self-pay | Admitting: Podiatry

## 2023-06-21 DIAGNOSIS — L089 Local infection of the skin and subcutaneous tissue, unspecified: Secondary | ICD-10-CM | POA: Diagnosis not present

## 2023-06-21 DIAGNOSIS — E11628 Type 2 diabetes mellitus with other skin complications: Secondary | ICD-10-CM | POA: Diagnosis not present

## 2023-06-21 LAB — GLUCOSE, CAPILLARY
Glucose-Capillary: 313 mg/dL — ABNORMAL HIGH (ref 70–99)
Glucose-Capillary: 116 mg/dL — ABNORMAL HIGH (ref 70–99)
Glucose-Capillary: 98 mg/dL (ref 70–99)

## 2023-06-21 MED ORDER — ALTEPLASE 2 MG IJ SOLR: 2.0000 mg | Freq: Once | INTRAMUSCULAR | Status: DC | PRN

## 2023-06-21 MED ORDER — HYDROMORPHONE HCL 1 MG/ML IJ SOLN
INTRAMUSCULAR | Status: AC
  Filled 2023-06-21: qty 1

## 2023-06-21 MED ORDER — LIDOCAINE-PRILOCAINE 2.5-2.5 % EX CREA: 1.0000 | TOPICAL_CREAM | CUTANEOUS | Status: DC | PRN

## 2023-06-21 MED ORDER — LIDOCAINE HCL (PF) 1 % IJ SOLN
5.0000 mL | INTRAMUSCULAR | Status: DC | PRN
  Filled 2023-06-21: qty 5

## 2023-06-21 MED ORDER — HEPARIN SODIUM (PORCINE) 1000 UNIT/ML DIALYSIS: 1000.0000 [IU] | INTRAMUSCULAR | Status: DC | PRN

## 2023-06-21 MED ORDER — RENA-VITE PO TABS: 1.0000 | ORAL_TABLET | Freq: Every day | ORAL | 2 refills | Status: AC

## 2023-06-21 MED ORDER — PENTAFLUOROPROP-TETRAFLUOROETH EX AERO: 1.0000 | INHALATION_SPRAY | CUTANEOUS | Status: DC | PRN

## 2023-06-21 MED ORDER — POTASSIUM CHLORIDE CRYS ER 20 MEQ PO TBCR: 40.0000 meq | EXTENDED_RELEASE_TABLET | Freq: Once | ORAL | Status: DC

## 2023-06-21 MED ORDER — VITAMIN D (ERGOCALCIFEROL) 1.25 MG (50000 UNIT) PO CAPS: 50000.0000 [IU] | ORAL_CAPSULE | ORAL | 0 refills | Status: AC

## 2023-06-21 MED ORDER — ANTICOAGULANT SODIUM CITRATE 4% (200MG/5ML) IV SOLN
5.0000 mL | Status: DC | PRN
  Filled 2023-06-21: qty 5

## 2023-06-21 NOTE — Progress Notes (Signed)
PT Cancellation Note  Patient Details Name: CARLASIA NORENBERG MRN: [id number] DOB: [birth year]   Cancelled Treatment:    Reason Eval/Treat Not Completed: Other (comment).  Pt currently off floor at dialysis.  Will re-attempt PT session at a later date/time.  Hendricks Limes, PT 06/21/23, 10:28 AM

## 2023-06-21 NOTE — Procedures (Signed)
Received patient in bed to unit.  Alert and oriented.  Informed consent signed and in chart.   TX duration: 3.25hrs  Patient tolerated well.  Transported back to the room  Alert, without acute distress.  Hand-off given to patient's nurse.   Access used: Right upper arm Access issues: NONE  Total UF removed: 2L. Medication(s) given: HYDROmorphone (DILAUDID) injection 1 mg     [facility]

## 2023-06-21 NOTE — Inpatient Diabetes Management (Signed)
Inpatient Diabetes Program Recommendations  AACE/ADA: New Consensus Statement on Inpatient Glycemic Control   Target Ranges:  Prepandial:   less than 140 mg/dL      Peak postprandial:   less than 180 mg/dL (1-2 hours)      Critically ill patients:  140 - 180 mg/dL    Latest Reference Range & Units 06/20/23 04:07 06/20/23 07:43 06/20/23 09:24 06/20/23 11:37 06/20/23 14:13 06/20/23 17:31 06/20/23 21:17  Glucose-Capillary 70 - 99 mg/dL 782 (H) 956 (H) 213 (HH) 358 (H) 209 (H) 177 (H) 234 (H)   Review of Glycemic Control  Diabetes history: DM1 (does NOT make any insulin; requires basal, correction, and carbohydrate coverage insulin) Outpatient Diabetes medications: T-Slim insulin pump with Humalog; Dexcom G7 CGM Current orders for Inpatient glycemic control: Semglee 25 units at bedtime, Novolog 0-6 units TID with meals  Inpatient Diabetes Program Recommendations:    Insulin: Please consider ordering Novolog 4 units TID with meals for meal coverage if patient eats at least 50% of meals.  NOTE: Patient has DM1 and sees [facility] for DM management; patient had televisit with Demaris Callander, PA on 04/06/23. Per office note on 04/06/23, patient's CBGs had been running high so "HIGH" profile was created. The following should be pump settings:  Dexcom - linked to clinic Tandem - phone app - linked to clinic  Normal         Basal   Insulin Sensitivity   Carb Ratio 12a  0.5      65                            8 3a    0.5      65                            8 6a    0.6      50                            8 11a   0.6     50                            8 4p     0.8     40                            7 9p     0.6     55                             8 Total basal: 17.6 units/24 hours  HIGH         Basal    Insulin Sensitivity   Carb Ratio 12a  0.8        40                           7 8a    0.9        35                           6 4p    1.1        30  6 9p    0.9         35                           7  Total basal: 21.8 units/24 hours  Patient was inpatient 04/07/23-04/08/23 with DKA and was seen by inpatient diabetes team on 04/08/23 during that admission. Patient admitted on 06/18/23 with diabetic foot infection. Initial lab glucose was 93 mg/dl on 04/24/61 at 95:28. Patient had foot surgery on 06/18/23 and was given Decadron 5 mg at 8:04 am on 06/19/23 which is contributing to hyperglycemia.  Patient will need meal coverage insulin ordered as patient does not make any insulin and needs insulin to cover carbohydrates consumed.   Thanks, Orlando Penner, RN, MSN, CDCES Diabetes Coordinator Inpatient Diabetes Program [phone] (Team Pager from 8am to 5pm)

## 2023-06-21 NOTE — Plan of Care (Signed)

## 2023-06-21 NOTE — Discharge Planning (Signed)
[facility] [address], [place] [address] [phone]  Dimas Chyle Dialysis Coordinator II  [facility] Cell: [phone] eFax: [phone] [email or web address]

## 2023-06-21 NOTE — Progress Notes (Signed)
[facility]  PROGRESS NOTE   Subjective:   Patient seen and evaluated during dialysis   HEMODIALYSIS FLOWSHEET:  Blood Flow Rate (mL/min): 320 mL/min Arterial Pressure (mmHg): -190 mmHg Venous Pressure (mmHg): 250 mmHg TMP (mmHg): 15 mmHg Ultrafiltration Rate (mL/min): 1161 mL/min Dialysate Flow Rate (mL/min): 300 ml/min  Tolerated treatment well Pain well controlled  Objective:  Vital signs: Blood pressure (!) 124/90, pulse 99, temperature 98.1 F (36.7 C), resp. rate 18, height 5\' 6"  (1.676 m), weight 72.6 kg, SpO2 100%.  Intake/Output Summary (Last 24 hours) at 06/21/2023 1255 Last data filed at 06/21/2023 1220 Gross per 24 hour  Intake --  Output 2000 ml  Net -2000 ml   Filed Weights   06/19/23 1455 06/21/23 0744 06/21/23 1216  Weight: 72.6 kg 74.3 kg 72.6 kg     Physical Exam: General:  No acute distress  Head:  Normocephalic, atraumatic. Moist oral mucosal membranes  Eyes:  Anicteric  Lungs:   Clear to auscultation, normal effort  Heart:  S1S2 no rubs  Abdomen:   Soft, nontender, bowel sounds present  Extremities:  Trace peripheral edema, Left surgical dressing  Neurologic:  Awake, alert, following commands  Skin:  No lesions  Access: Rt AVF    Basic Metabolic Panel: Recent Labs  Lab 06/18/23 1231 06/19/23 0437 06/20/23 0414 06/21/23 0337  NA 133* 129* 129* 134*  K 4.2 4.6 4.2 3.3*  CL 95* 93* 91* 95*  CO2 23 14* 22 23  GLUCOSE 93 404* 476* 210*  BUN 63* 72* 48* 50*  CREATININE 9.58* 10.14* 6.60* 7.63*  CALCIUM 8.0* 7.8* 7.9* 8.1*  MG  --   --  2.5* 2.7*  PHOS  --   --  7.3* 8.3*   GFR: Estimated Creatinine Clearance: 11.2 mL/min (A) (by C-G formula based on SCr of 7.63 mg/dL (H)).  Liver Function Tests: Recent Labs  Lab 06/18/23 1231 06/19/23 0437  AST 17 11*  ALT 13 11  ALKPHOS 55 51  BILITOT 0.6 1.4*  PROT 7.7 7.0  ALBUMIN 3.3* 2.9*   No results for input(s): "LIPASE", "AMYLASE" in the last 168 hours. No results  for input(s): "AMMONIA" in the last 168 hours.  CBC: Recent Labs  Lab 06/18/23 1235 06/19/23 0437 06/20/23 0414 06/21/23 0337  WBC 9.3 5.8 7.2 5.8  HGB 10.2* 9.6* 10.6* 10.7*  HCT 30.5* 29.9* 32.2* 32.1*  MCV 89.4 92.3 89.4 89.9  PLT 308 297 350 364     HbA1C: Hgb A1c MFr Bld  Date/Time Value Ref Range Status  06/18/2023 12:35 PM 8.5 (H) 4.8 - 5.6 % Final    Comment:    (NOTE) Pre diabetes:          5.7%-6.4%  Diabetes:              >6.4%  Glycemic control for   <7.0% adults with diabetes   11/20/2022 07:49 PM 7.9 (H) 4.8 - 5.6 % Final    Comment:    (NOTE)         Prediabetes: 5.7 - 6.4         Diabetes: >6.4         Glycemic control for adults with diabetes: <7.0     Urinalysis: No results for input(s): "COLORURINE", "LABSPEC", "PHURINE", "GLUCOSEU", "HGBUR", "BILIRUBINUR", "KETONESUR", "PROTEINUR", "UROBILINOGEN", "NITRITE", "LEUKOCYTESUR" in the last 72 hours.  Invalid input(s): "APPERANCEUR"    Imaging: DG Foot Complete Left  Result Date: 06/19/2023 CLINICAL DATA:  Left foot infection. Status post left  foot first metatarsal amputation. EXAM: LEFT FOOT - COMPLETE 3+ VIEW COMPARISON:  Left foot radiographs 11/20/2022, MRI left forefoot 11/20/2022, MRI left forefoot 06/18/2023 FINDINGS: Interval repeat amputation of the first ray, now to the far proximal aspect of the first metatarsal. There are antibiotic beads seen within the amputation space. Expected postoperative air within the amputation space. 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 Mild to moderate navicular-medial cuneiform joint space narrowing, subchondral sclerosis, peripheral osteophytosis. IMPRESSION: Interval repeat amputation of the first ray, now to the far proximal aspect of the first metatarsal. Expected postoperative changes. Electronically Signed   By: Neita Garnet M.D.   On: 06/19/2023 17:20      Medications:    sodium chloride Stopped (06/19/23 0104)   anticoagulant sodium citrate     cefTRIAXone (ROCEPHIN)  IV 2 g (06/20/23 2152)   metronidazole Stopped (06/21/23 0908)   vancomycin      amLODipine  5 mg Oral Daily   calcium acetate  667 mg Oral TID WC   Chlorhexidine Gluconate Cloth  6 each Topical Q0600   heparin  5,000 Units Subcutaneous Q8H   insulin aspart  0-6 Units Subcutaneous TID AC & HS   insulin glargine-yfgn  25 Units Subcutaneous QHS   multivitamin  1 tablet Oral QHS   nutrition supplement (JUVEN)  1 packet Oral BID BM   potassium chloride SA  40 mEq Oral Once   sodium chloride flush  3 mL Intravenous Q12H   vancomycin variable dose per unstable renal function (pharmacist dosing)   Does not apply See admin instructions   Vitamin D (Ergocalciferol)  50,000 Units Oral Q7 days    Assessment/ Plan:     She is a 27 year old female with a past medical history of type 1 diabetes on insulin pump, hypertension, history of diabetic foot infections, end-stage renal disease on hemodialysis now admitted for foot infection.  She had toe amputation this morning. She denies any chest pain, shortness of breath or orthopnea.     ESRD on hemodialysis: Receiving dialysis today, UF goal 2L as tolerated. Next treatment scheduled for Wednesday.   ANEMIA with chronic kidney disease: Continue to follow anemia protocols. Hgb 10.7, at goal   Secondary parathyroidism:  Calcium levels acceptable, phosphorus levels elevated. Continue calcium acetate with meals.   Hypertension with chronic kidney disease: Home regimen includes furosemide, amlodipine and losartan. Continue amlodipine.   Foot infection: Has been on Rocephin, metronidazole and vancomycin.  She had left partial first ray amputation by podiatry on 06/19/23.    LOS: 3 [facility] 8/5/202412:55 PM

## 2023-06-21 NOTE — TOC Transition Note (Addendum)
Transition of Care [facility]) - CM/SW Discharge Note   Patient Details  Name: Emma Thomas MRN: [id number] Date of Birth: 06-05-1996  [facility] CM/SW Contact:  Margarito Liner, LCSW Phone Number: 06/21/2023, 1:50 PM   Clinical Narrative:  Patient has orders to discharge home today. Readmission prevention screen complete. CSW met with patient. No supports at bedside. CSW introduced role and explained that discharge planning would be discussed. PCP is [facility]. She does not see a specific provider there. Patient drives herself to appointments. Pharmacy is [facility] [address]. No issues obtaining medications. Patient lives home with her husband and 80-year old child. No home health prior to admission and patient is not interested in services at this time. Per chart review, TOC ordered some crutches for her in January but patient stated she got rid of them. Patient is unable to take the crutches home that are in the room as they belong on the rehab department. CSW left message for [employer] liaison to see if she can get new crutches from them. Her husband will transport her home today.   2:39 pm: Private pay cost for crutches is around $40. Patient will look for some at the hospice thrift store, [employer], etc. No further concerns. CSW signing off.  Final next level of care: Home/Self Care Barriers to Discharge: No Barriers Identified   Patient Goals and CMS Choice      Discharge Placement                  Patient to be transferred to facility by: Husband   Patient and family notified of of transfer: 06/21/23  Discharge Plan and Services Additional resources added to the After Visit Summary for                                       Social Determinants of Health (SDOH) Interventions SDOH Screenings   Food Insecurity: No Food Insecurity (06/18/2023)  Housing: Low Risk  (06/18/2023)  Transportation Needs: No Transportation Needs (06/18/2023)   Utilities: Not At Risk (06/18/2023)  Financial Resource Strain: Low Risk  (05/31/2023)   Received [facility]  Tobacco Use: Low Risk  (06/18/2023)     Readmission Risk Interventions    06/21/2023    1:28 PM 11/21/2022   11:46 AM 04/24/2022    1:30 PM  Readmission Risk Prevention Plan  Transportation Screening Complete Complete Complete  PCP or Specialist Appt within 3-5 Days Complete Complete Complete  HRI or Home Care Consult Patient refused Complete Complete  Social Work Consult for Recovery Care Planning/Counseling Complete Complete Complete  Palliative Care Screening Not Applicable Not Applicable Not Applicable  Medication Review Oceanographer) Complete Complete Complete

## 2023-06-21 NOTE — Discharge Summary (Signed)
[facility] Discharge Summary   Patient: Emma Thomas Emma Thomas  PCP: [name]  Date of admission: 06/18/2023   Date of discharge:  06/21/2023     Discharge Diagnoses:  Principal Problem:   Diabetic foot infection (HCC) Active Problems:   CKD (chronic kidney disease) stage V requiring chronic dialysis (HCC)   Diastolic dysfunction   Hypertension   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 (HCC)   Admitted From: Home Disposition:  Home   Recommendations for Outpatient Follow-up:  Follow-up with PCP in 1 week, continue to monitor CBG at home and use insulin pump at home, and follow with PCP. Monitor BP at home Follow-up with podiatry in 1 week Follow-up with nephrology and continue hemodialysis as per schedule Follow up LABS/TEST:     Follow-up Information     Rosetta Posner, DPM. Schedule an appointment as soon as possible for a visit in 1 week(s).   Specialty: Podiatry Why: For wound re-check Contact information: [address] [place] [address] [phone]                Diet recommendation: Cardiac and Carb modified diet  Activity: The patient is advised to gradually reintroduce usual activities, as tolerated  Discharge Condition: stable  Code Status: Full code   History of present illness: As per the H and P dictated on admission Hospital Course:  RYANNAH BROOMELL is a 27 y.o. female with medical history significant of type 1 diabetes with insulin pump in place, end-stage renal disease on hemodialysis at home daily, hypertension, anemia of chronic disease, history of diabetic foot infection status post amputations of the great toes bilaterally presenting with diabetic foot infection.  Patient noted to be followed by Dr. Excell Seltzer with podiatry outpatient.  Per report, patient with worsening redness pain and swelling.  Positive generalized malaise at home over multiple days.  Patient reports blood sugars  have been poorly controlled at home.  No fevers or chills.  No nausea or vomiting.  No chest pain or shortness of breath.  No abdominal pain.  Was evaluated at Dr. Bernette Redbird office today and the area of the left foot at the base of the first and second digits which have been previously amputated.  Worsening redness and pain with concern for infection.  Patient redirected to the ER for imaging and likely operative evaluation. Presented to the ER afebrile, hemodynamically stable.  White count 9.3, hemoglobin 10.2, creatinine 9.6.  Pending MRI of the left foot.   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 S/p empiric antibiotics vancomycin, ceftriaxone and Flagyl. Pharmacy was consulted for dosing and trough monitoring. culture from the OR still in process, NGTD. Podiatrist Recommended discharge on oral Augmentin and Percocet, follow-up as an outpatient. # ESRD on HQ:[id number] consulted for hemodialysis # Diastolic dysfunction 2D echo 08/05/2022 with EF 55 to 60% and grade 1 diastolic dysfunction Monitor volume status in the setting ESRD. 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 Patient was given Semglee 12 units and NovoLog sliding scale. On 8/4 increased Semglee 25 units nightly and on 8/4 patient had significant elevated blood glucose over 500, plan was to start insulin IV infusion but later on blood glucose improved and  did not need IV insulin infusion.  Patient was discharged home and advised to continue insulin pump, monitor CBG closely and follow with PCP for further management.  Continue diabetic diet.  # Hypertension: Resumed amlodipine 5 mg p.o. daily. # Anemia of chronic disease, Iron profile within normal range, folate  and B12 WNL # Vitamin D deficiency: started vitamin D 50,000 units p.o. weekly, follow with PCP to repeat vitamin D level after 3 to 6 months.    Body mass index is 25.83 kg/m.  Nutrition Problem: Increased nutrient needs Etiology: wound healing Nutrition Interventions: Interventions: MVI, Juven, Liberalize Diet   Pain control  - I did not review [place] Controlled Substance Reporting System database, because Percocet was prescribed by podiatrist. - 7 day supply was provided by podiatry - Patient was instructed, not to drive, operate heavy machinery, perform activities at heights, swimming or participation in water activities or provide baby sitting services while on Pain, Sleep and Anxiety Medications; until her outpatient Physician has advised to do so again.  - Also recommended to not to take more than prescribed Pain, Sleep and Anxiety Medications.  Patient was seen by physical therapy, who recommended Home health, which was arranged. On the day of the discharge the patient's vitals were stable, and no other acute medical condition were reported by patient. the patient was felt safe to be discharge at Home with Home health.  Consultants: Podiatrist, nephrology Procedures: s/p Left partial first ray amputation and Antibiotic bead application  Discharge Exam: General: Appear in no distress, no Rash; Oral Mucosa Clear, moist. Cardiovascular: S1 and S2 Present, no Murmur, Respiratory: normal respiratory effort, Bilateral Air entry present and no Crackles, no wheezes Abdomen: Bowel Sound present, Soft and no tenderness, no hernia Extremities: no Pedal edema, no calf tenderness Neurology: alert and oriented to time, place, and person affect appropriate.  Filed Weights   06/19/23 1455 06/21/23 0744 06/21/23 1216  Weight: 72.6 kg 74.3 kg 72.6 kg   Vitals:   06/21/23 1231 06/21/23 1233  BP: 132/88 (!) 124/90  Pulse:  99  Resp:  18  Temp:  98.1 F (36.7 C)  SpO2:  100%     DISCHARGE MEDICATION: Allergies as of 06/21/2023   No Known Allergies      Medication List     STOP taking these medications    potassium chloride 10 MEQ tablet Commonly known as: KLOR-CON M   pregabalin 25 MG capsule Commonly known as: Lyrica       TAKE these medications    acetaminophen 500 MG tablet Commonly known as: TYLENOL Take 2 tablets (1,000 mg total) by mouth every 6 (six) hours as needed for mild pain.   amLODipine 10 MG tablet Commonly known as: NORVASC Take 1 tablet (10 mg total) by mouth daily.   amoxicillin-clavulanate 500-125 MG tablet Commonly known as: Augmentin Take 1 tablet by mouth daily for 10 days.   atorvastatin 80 MG tablet Commonly known as: LIPITOR Take 80 mg by mouth daily.   bumetanide 1 MG tablet Commonly known as: BUMEX Take 1 mg by mouth 2 (two) times daily.   furosemide 80 MG tablet Commonly known as: LASIX Take 80 mg by mouth daily.   HumaLOG 100 UNIT/ML injection Generic drug: insulin lispro Inject 0-1 mLs (0-100 Units total) into the skin daily. Uses with Insulin Pump   losartan 100 MG tablet Commonly known as: COZAAR Take 100 mg by mouth daily.   montelukast 10 MG tablet Commonly known as: SINGULAIR Take  10 mg by mouth at bedtime.   multivitamin Tabs tablet Take 1 tablet by mouth at bedtime.   norethindrone-ethinyl estradiol 1-20 MG-MCG tablet Commonly known as: LOESTRIN Take 1 tablet by mouth daily.   ondansetron 8 MG tablet Commonly known as: ZOFRAN Take 8 mg by mouth daily.   oxyCODONE-acetaminophen 5-325 MG tablet Commonly known as: Percocet Take 1 tablet by mouth every 6 (six) hours as needed for up to 7 days for severe pain.   sevelamer carbonate 800 MG tablet Commonly known as: RENVELA Take 800-1,600 mg by mouth See admin instructions. Take 2 tablets (1600mg ) by mouth three times daily with meals and take 1 tablet (800mg ) by mouth daily with snacks   SUMAtriptan 50 MG tablet Commonly known  as: IMITREX Take 50 mg by mouth daily as needed for migraine.   Vitamin D (Ergocalciferol) 1.25 MG (50000 UNIT) Caps capsule Commonly known as: DRISDOL Take 1 capsule (50,000 Units total) by mouth every 7 (seven) days. Start taking on: June 27, 2023               Discharge Care Instructions  (From admission, onward)           Start     Ordered   06/21/23 0000  Discharge wound care:       Comments: As per podiatry   06/21/23 1311           No Known Allergies Discharge Instructions     Call MD for:  difficulty breathing, headache or visual disturbances   Complete by: As directed    Call MD for:  extreme fatigue   Complete by: As directed    Call MD for:  persistant dizziness or light-headedness   Complete by: As directed    Call MD for:  severe uncontrolled pain   Complete by: As directed    Call MD for:  temperature >100.4   Complete by: As directed    Diet - low sodium heart healthy   Complete by: As directed    Diet Carb Modified   Complete by: As directed    Discharge instructions   Complete by: As directed    Follow-up with PCP in 1 week, continue to monitor CBG at home and use insulin pump at home, and follow with PCP. Monitor BP at home Follow-up with podiatry in 1 week Follow-up with nephrology and continue hemodialysis as per schedule   Discharge wound care:   Complete by: As directed    As per podiatry   Increase activity slowly   Complete by: As directed        The results of significant diagnostics from this hospitalization (including imaging, microbiology, ancillary and laboratory) are listed below for reference.    Significant Diagnostic Studies: DG Foot Complete Left  Result Date: 06/19/2023 CLINICAL DATA:  Left foot infection. Status post left foot first metatarsal amputation. EXAM: LEFT FOOT - COMPLETE 3+ VIEW COMPARISON:  Left foot radiographs 11/20/2022, MRI left forefoot 11/20/2022, MRI left forefoot 06/18/2023 FINDINGS:  Interval repeat amputation of the first ray, now to the far proximal aspect of the first metatarsal. There are antibiotic beads seen within the amputation space. Expected postoperative air within the amputation space. 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 Mild to moderate navicular-medial cuneiform joint space narrowing, subchondral sclerosis, peripheral osteophytosis. IMPRESSION: Interval repeat amputation of the first ray, now to the far proximal aspect of the first  metatarsal. Expected postoperative changes. Electronically Signed   By: Neita Garnet M.D.   On: 06/19/2023 17:20   MR FOOT LEFT WO CONTRAST  Result Date: 06/18/2023 CLINICAL DATA:  Left foot infection.  History of prior amputation. EXAM: MRI OF THE LEFT FOOT WITHOUT CONTRAST TECHNIQUE: Multiplanar, multisequence MR imaging of the left forefoot was performed. No intravenous contrast was administered. COMPARISON:  MRI left foot and left foot x-rays dated November 20, 2022. FINDINGS: Bones/Joint/Cartilage Prior first ray amputation. Abnormal marrow edema with corresponding decreased T1 marrow signal involving the distal residual first metatarsal. 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 No fracture or dislocation. No joint effusion. Ligaments Second through fifth toe collateral ligaments are intact. Lisfranc ligament is intact. Muscles and Tendons Second through fifth flexor and extensor tendons are intact. No tenosynovitis. Increased T2 signal within the intrinsic muscles of the forefoot, nonspecific, but likely related to diabetic muscle changes. Soft tissue 4.8 x 2.1 x 2.9 cm complex fluid collection at the first ray amputation site (series 10, image 14), with two sinus tracts draining to the skin surface (series 7, images 16 and 29). No soft tissue mass. IMPRESSION: 1. Prior first ray  amputation with osteomyelitis of the distal residual first metatarsal. 2. 4.8 x 2.1 x 2.9 cm abscess at the first ray amputation site. 3. 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 Electronically Signed   By: Obie Dredge M.D.   On: 06/18/2023 15:08    Microbiology: Recent Results (from the past 240 hour(s))  Culture, blood (Routine X 2) w Reflex to ID Panel     Status: None (Preliminary result)   Collection Time: 06/18/23  3:11 PM   Specimen: BLOOD LEFT ARM  Result Value Ref Range Status   Specimen Description BLOOD LEFT ARM  Final   Special Requests   Final    BOTTLES DRAWN AEROBIC AND ANAEROBIC Blood Culture adequate volume   Culture   Final    NO GROWTH 3 DAYS Performed at [facility], [address] [place], [place] [address]    Report Status PENDING  Incomplete  Culture, blood (Routine X 2) w Reflex to ID Panel     Status: None (Preliminary result)   Collection Time: 06/18/23  3:16 PM   Specimen: BLOOD LEFT HAND  Result Value Ref Range Status   Specimen Description BLOOD LEFT HAND  Final   Special Requests   Final    BOTTLES DRAWN AEROBIC AND ANAEROBIC Blood Culture adequate volume   Culture   Final    NO GROWTH 3 DAYS Performed at [facility], [address] [place], [place] [address]    Report Status PENDING  Incomplete  Aerobic/Anaerobic Culture w Gram Stain (surgical/deep wound)     Status: None (Preliminary result)   Collection Time: 06/19/23  7:31 AM   Specimen: PATH Soft tissue  Result Value Ref Range Status   Specimen Description   Final    TISSUE SOFT LEFT FOOT Performed at [facility], [address], [place], [place] [address]    Special Requests   Final    NONE Performed at [facility], [address] [place], [place] [address]    Gram Stain RARE WBC SEEN NO ORGANISMS SEEN   Final   Culture   Final    RARE STREPTOCOCCUS MITIS/ORALIS SUSCEPTIBILITIES TO FOLLOW Performed at [facility], [address] [place], [place] [address]    Report  Status PENDING  Incomplete  Aerobic/Anaerobic Culture w Gram Stain (surgical/deep wound)     Status: None (Preliminary result)   Collection Time: 06/19/23  7:32 AM   Specimen: PATH Bone resection; Tissue  Result Value Ref Range Status   Specimen Description   Final    BONE RESECTION Performed at [facility], [address], [place], [place] [address]    Special Requests   Final    NONE Performed at [facility], [address] [place], [place] [address]    Gram Stain RARE WBC SEEN NO ORGANISMS SEEN   Final   Culture   Final    NO GROWTH 2 DAYS Performed at [facility], [address], [place], [place] [address]    Report Status PENDING  Incomplete     Labs: CBC: Recent Labs  Lab 06/18/23 1235 06/19/23 0437 06/20/23 0414 06/21/23 0337  WBC 9.3 5.8 7.2 5.8  HGB 10.2* 9.6* 10.6* 10.7*  HCT 30.5* 29.9* 32.2* 32.1*  MCV 89.4 92.3 89.4 89.9  PLT 308 297 350 364   Basic Metabolic Panel: Recent Labs  Lab 06/18/23 1231 06/19/23 0437 06/20/23 0414 06/21/23 0337  NA 133* 129* 129* 134*  K 4.2 4.6 4.2 3.3*  CL 95* 93* 91* 95*  CO2 23 14* 22 23  GLUCOSE 93 404* 476* 210*  BUN 63* 72* 48* 50*  CREATININE 9.58* 10.14* 6.60* 7.63*  CALCIUM 8.0* 7.8* 7.9* 8.1*  MG  --   --  2.5* 2.7*  PHOS  --   --  7.3* 8.3*   Liver Function Tests: Recent Labs  Lab 06/18/23 1231 06/19/23 0437  AST 17 11*  ALT 13 11  ALKPHOS 55 51  BILITOT 0.6 1.4*  PROT 7.7 7.0  ALBUMIN 3.3* 2.9*   No results for input(s): "LIPASE", "AMYLASE" in the last 168 hours. No results for input(s): "AMMONIA" in the last 168 hours. Cardiac Enzymes: No results for input(s): "CKTOTAL", "CKMB", "CKMBINDEX", "TROPONINI" in the last 168 hours. BNP (last 3 results) No results for input(s): "BNP" in the last 8760 hours. CBG: Recent Labs  Lab 06/20/23 1413 06/20/23 1731 06/20/23 2117 06/21/23 0920 06/21/23 1233  GLUCAP  209* 177* 234* 116* 98    Time spent: 35 minutes  Signed:  Gillis Santa  [facility] 06/21/2023 1:11 PM

## 2023-06-22 LAB — AEROBIC/ANAEROBIC CULTURE W GRAM STAIN (SURGICAL/DEEP WOUND)

## 2023-06-27 LAB — AEROBIC/ANAEROBIC CULTURE W GRAM STAIN (SURGICAL/DEEP WOUND): Culture: NEGATIVE

## 2023-07-07 ENCOUNTER — Other Ambulatory Visit: Payer: Self-pay

## 2023-07-07 ENCOUNTER — Encounter: Payer: Self-pay | Admitting: Internal Medicine

## 2023-07-07 ENCOUNTER — Emergency Department: Payer: 59

## 2023-07-07 ENCOUNTER — Inpatient Hospital Stay
Admission: EM | Admit: 2023-07-07 | Discharge: 2023-07-09 | DRG: 474 | Discharge status: Against Medical Advice | Payer: 59 | Attending: Internal Medicine | Admitting: Internal Medicine

## 2023-07-07 ENCOUNTER — Inpatient Hospital Stay: Payer: 59

## 2023-07-07 DIAGNOSIS — L02612 Cutaneous abscess of left foot: Secondary | ICD-10-CM | POA: Diagnosis present

## 2023-07-07 DIAGNOSIS — Z1152 Encounter for screening for COVID-19: Secondary | ICD-10-CM | POA: Diagnosis not present

## 2023-07-07 DIAGNOSIS — R739 Hyperglycemia, unspecified: Secondary | ICD-10-CM | POA: Diagnosis present

## 2023-07-07 DIAGNOSIS — I132 Hypertensive heart and chronic kidney disease with heart failure and with stage 5 chronic kidney disease, or end stage renal disease: Secondary | ICD-10-CM | POA: Diagnosis present

## 2023-07-07 DIAGNOSIS — E1122 Type 2 diabetes mellitus with diabetic chronic kidney disease: Secondary | ICD-10-CM | POA: Diagnosis not present

## 2023-07-07 DIAGNOSIS — Z89412 Acquired absence of left great toe: Secondary | ICD-10-CM

## 2023-07-07 DIAGNOSIS — Y835 Amputation of limb(s) as the cause of abnormal reaction of the patient, or of later complication, without mention of misadventure at the time of the procedure: Secondary | ICD-10-CM | POA: Diagnosis present

## 2023-07-07 DIAGNOSIS — Z8659 Personal history of other mental and behavioral disorders: Secondary | ICD-10-CM

## 2023-07-07 DIAGNOSIS — M86672 Other chronic osteomyelitis, left ankle and foot: Secondary | ICD-10-CM | POA: Diagnosis present

## 2023-07-07 DIAGNOSIS — R6520 Severe sepsis without septic shock: Secondary | ICD-10-CM | POA: Diagnosis present

## 2023-07-07 DIAGNOSIS — D631 Anemia in chronic kidney disease: Secondary | ICD-10-CM | POA: Diagnosis present

## 2023-07-07 DIAGNOSIS — F331 Major depressive disorder, recurrent, moderate: Secondary | ICD-10-CM | POA: Diagnosis present

## 2023-07-07 DIAGNOSIS — E1069 Type 1 diabetes mellitus with other specified complication: Secondary | ICD-10-CM | POA: Diagnosis present

## 2023-07-07 DIAGNOSIS — A419 Sepsis, unspecified organism: Secondary | ICD-10-CM | POA: Diagnosis present

## 2023-07-07 DIAGNOSIS — Z79899 Other long term (current) drug therapy: Secondary | ICD-10-CM

## 2023-07-07 DIAGNOSIS — M86172 Other acute osteomyelitis, left ankle and foot: Secondary | ICD-10-CM | POA: Diagnosis present

## 2023-07-07 DIAGNOSIS — N2581 Secondary hyperparathyroidism of renal origin: Secondary | ICD-10-CM | POA: Diagnosis present

## 2023-07-07 DIAGNOSIS — E11628 Type 2 diabetes mellitus with other skin complications: Secondary | ICD-10-CM | POA: Diagnosis not present

## 2023-07-07 DIAGNOSIS — Z803 Family history of malignant neoplasm of breast: Secondary | ICD-10-CM

## 2023-07-07 DIAGNOSIS — Z9641 Presence of insulin pump (external) (internal): Secondary | ICD-10-CM | POA: Diagnosis present

## 2023-07-07 DIAGNOSIS — I5032 Chronic diastolic (congestive) heart failure: Secondary | ICD-10-CM | POA: Diagnosis present

## 2023-07-07 DIAGNOSIS — E875 Hyperkalemia: Secondary | ICD-10-CM | POA: Diagnosis present

## 2023-07-07 DIAGNOSIS — T8744 Infection of amputation stump, left lower extremity: Principal | ICD-10-CM | POA: Diagnosis present

## 2023-07-07 DIAGNOSIS — E10628 Type 1 diabetes mellitus with other skin complications: Secondary | ICD-10-CM | POA: Diagnosis present

## 2023-07-07 DIAGNOSIS — E1010 Type 1 diabetes mellitus with ketoacidosis without coma: Secondary | ICD-10-CM | POA: Diagnosis present

## 2023-07-07 DIAGNOSIS — Z801 Family history of malignant neoplasm of trachea, bronchus and lung: Secondary | ICD-10-CM

## 2023-07-07 DIAGNOSIS — F32A Depression, unspecified: Secondary | ICD-10-CM | POA: Diagnosis present

## 2023-07-07 DIAGNOSIS — D638 Anemia in other chronic diseases classified elsewhere: Secondary | ICD-10-CM | POA: Diagnosis not present

## 2023-07-07 DIAGNOSIS — E871 Hypo-osmolality and hyponatremia: Secondary | ICD-10-CM | POA: Diagnosis present

## 2023-07-07 DIAGNOSIS — Z794 Long term (current) use of insulin: Secondary | ICD-10-CM | POA: Diagnosis not present

## 2023-07-07 DIAGNOSIS — E10621 Type 1 diabetes mellitus with foot ulcer: Secondary | ICD-10-CM | POA: Diagnosis present

## 2023-07-07 DIAGNOSIS — Z5329 Procedure and treatment not carried out because of patient's decision for other reasons: Secondary | ICD-10-CM | POA: Diagnosis not present

## 2023-07-07 DIAGNOSIS — E1022 Type 1 diabetes mellitus with diabetic chronic kidney disease: Secondary | ICD-10-CM | POA: Diagnosis present

## 2023-07-07 DIAGNOSIS — Z992 Dependence on renal dialysis: Secondary | ICD-10-CM

## 2023-07-07 DIAGNOSIS — N186 End stage renal disease: Secondary | ICD-10-CM

## 2023-07-07 DIAGNOSIS — R54 Age-related physical debility: Secondary | ICD-10-CM | POA: Diagnosis present

## 2023-07-07 DIAGNOSIS — N189 Chronic kidney disease, unspecified: Secondary | ICD-10-CM | POA: Diagnosis present

## 2023-07-07 DIAGNOSIS — E785 Hyperlipidemia, unspecified: Secondary | ICD-10-CM | POA: Diagnosis present

## 2023-07-07 DIAGNOSIS — L570 Actinic keratosis: Secondary | ICD-10-CM | POA: Diagnosis present

## 2023-07-07 DIAGNOSIS — L089 Local infection of the skin and subcutaneous tissue, unspecified: Secondary | ICD-10-CM | POA: Diagnosis not present

## 2023-07-07 DIAGNOSIS — E1040 Type 1 diabetes mellitus with diabetic neuropathy, unspecified: Secondary | ICD-10-CM | POA: Diagnosis present

## 2023-07-07 DIAGNOSIS — I10 Essential (primary) hypertension: Secondary | ICD-10-CM | POA: Diagnosis present

## 2023-07-07 DIAGNOSIS — L97422 Non-pressure chronic ulcer of left heel and midfoot with fat layer exposed: Secondary | ICD-10-CM | POA: Diagnosis present

## 2023-07-07 DIAGNOSIS — I5189 Other ill-defined heart diseases: Secondary | ICD-10-CM

## 2023-07-07 DIAGNOSIS — Z833 Family history of diabetes mellitus: Secondary | ICD-10-CM

## 2023-07-07 DIAGNOSIS — M79672 Pain in left foot: Secondary | ICD-10-CM | POA: Diagnosis present

## 2023-07-07 DIAGNOSIS — Z91158 Patient's noncompliance with renal dialysis for other reason: Secondary | ICD-10-CM

## 2023-07-07 DIAGNOSIS — R64 Cachexia: Secondary | ICD-10-CM | POA: Diagnosis present

## 2023-07-07 DIAGNOSIS — Z87440 Personal history of urinary (tract) infections: Secondary | ICD-10-CM

## 2023-07-07 LAB — CBC WITH DIFFERENTIAL/PLATELET
Abs Immature Granulocytes: 0.01 10*3/uL (ref 0.00–0.07)
Basophils Absolute: 0 10*3/uL (ref 0.0–0.1)
Basophils Relative: 0 %
Eosinophils Absolute: 0 10*3/uL (ref 0.0–0.5)
Eosinophils Relative: 0 %
HCT: 25.2 % — ABNORMAL LOW (ref 36.0–46.0)
Hemoglobin: 8.2 g/dL — ABNORMAL LOW (ref 12.0–15.0)
Immature Granulocytes: 0 %
Lymphocytes Relative: 11 %
Lymphs Abs: 0.4 10*3/uL — ABNORMAL LOW (ref 0.7–4.0)
MCH: 29.8 pg (ref 26.0–34.0)
MCHC: 32.5 g/dL (ref 30.0–36.0)
MCV: 91.6 fL (ref 80.0–100.0)
Monocytes Absolute: 0 10*3/uL — ABNORMAL LOW (ref 0.1–1.0)
Monocytes Relative: 0 %
Neutro Abs: 3.3 10*3/uL (ref 1.7–7.7)
Neutrophils Relative %: 89 %
Platelets: 304 10*3/uL (ref 150–400)
RBC: 2.75 MIL/uL — ABNORMAL LOW (ref 3.87–5.11)
RDW: 12.3 % (ref 11.5–15.5)
Smear Review: NORMAL
WBC: 3.7 10*3/uL — ABNORMAL LOW (ref 4.0–10.5)
nRBC: 0 % (ref 0.0–0.2)

## 2023-07-07 LAB — COMPREHENSIVE METABOLIC PANEL
ALT: 14 U/L (ref 0–44)
AST: 22 U/L (ref 15–41)
Albumin: 2.7 g/dL — ABNORMAL LOW (ref 3.5–5.0)
Alkaline Phosphatase: 65 U/L (ref 38–126)
Anion gap: 22 — ABNORMAL HIGH (ref 5–15)
BUN: 60 mg/dL — ABNORMAL HIGH (ref 6–20)
CO2: 10 mmol/L — ABNORMAL LOW (ref 22–32)
Calcium: 7.1 mg/dL — ABNORMAL LOW (ref 8.9–10.3)
Chloride: 96 mmol/L — ABNORMAL LOW (ref 98–111)
Creatinine, Ser: 10.45 mg/dL — ABNORMAL HIGH (ref 0.44–1.00)
GFR, Estimated: 5 mL/min — ABNORMAL LOW (ref >60)
Glucose, Bld: 307 mg/dL — ABNORMAL HIGH (ref 70–99)
Potassium: 5.2 mmol/L — ABNORMAL HIGH (ref 3.5–5.1)
Sodium: 128 mmol/L — ABNORMAL LOW (ref 135–145)
Total Bilirubin: 1.5 mg/dL — ABNORMAL HIGH (ref 0.3–1.2)
Total Protein: 7.1 g/dL (ref 6.5–8.1)

## 2023-07-07 LAB — URINALYSIS, W/ REFLEX TO CULTURE (INFECTION SUSPECTED)
Bilirubin Urine: NEGATIVE
Glucose, UA: >=500 mg/dL — AB
Ketones, ur: 20 mg/dL — AB
Leukocytes,Ua: NEGATIVE
Nitrite: NEGATIVE
Protein, ur: >=300 mg/dL — AB
Specific Gravity, Urine: 1.011 (ref 1.005–1.030)
Squamous Epithelial / HPF: >50 /HPF (ref 0–5)
pH: 6 (ref 5.0–8.0)

## 2023-07-07 LAB — LACTIC ACID, PLASMA
Lactic Acid, Venous: 4.5 mmol/L (ref 0.5–1.9)
Lactic Acid, Venous: 1.3 mmol/L (ref 0.5–1.9)

## 2023-07-07 LAB — SARS CORONAVIRUS 2 BY RT PCR: SARS Coronavirus 2 by RT PCR: NEGATIVE

## 2023-07-07 LAB — PROTIME-INR
INR: 1.3 — ABNORMAL HIGH (ref 0.8–1.2)
Prothrombin Time: 16.4 seconds — ABNORMAL HIGH (ref 11.4–15.2)

## 2023-07-07 LAB — POC URINE PREG, ED: Preg Test, Ur: NEGATIVE

## 2023-07-07 LAB — GLUCOSE, CAPILLARY: Glucose-Capillary: 351 mg/dL — ABNORMAL HIGH (ref 70–99)

## 2023-07-07 MED ORDER — SODIUM CHLORIDE 0.9 % IV SOLN
2.0000 g | Freq: Once | INTRAVENOUS | Status: AC
  Administered 2023-07-07: 2 g via INTRAVENOUS
  Filled 2023-07-07: qty 12.5

## 2023-07-07 MED ORDER — INSULIN ASPART 100 UNIT/ML IJ SOLN
0.0000 [IU] | Freq: Every day | INTRAMUSCULAR | Status: DC
  Administered 2023-07-07: 5 [IU] via SUBCUTANEOUS
  Filled 2023-07-07: qty 1

## 2023-07-07 MED ORDER — SODIUM CHLORIDE 0.9 % IV SOLN: INTRAVENOUS | Status: DC

## 2023-07-07 MED ORDER — MORPHINE SULFATE (PF) 2 MG/ML IV SOLN
2.0000 mg | INTRAVENOUS | Status: DC | PRN
  Administered 2023-07-07: 2 mg via INTRAVENOUS
  Filled 2023-07-07: qty 1

## 2023-07-07 MED ORDER — LACTATED RINGERS IV BOLUS (SEPSIS)
500.0000 mL | Freq: Once | INTRAVENOUS | Status: AC
  Administered 2023-07-07: 500 mL via INTRAVENOUS

## 2023-07-07 MED ORDER — ACETAMINOPHEN 650 MG RE SUPP: 650.0000 mg | Freq: Four times a day (QID) | RECTAL | Status: DC | PRN

## 2023-07-07 MED ORDER — ACETAMINOPHEN 500 MG PO TABS
1000.0000 mg | ORAL_TABLET | Freq: Once | ORAL | Status: AC
  Administered 2023-07-07: 1000 mg via ORAL
  Filled 2023-07-07: qty 2

## 2023-07-07 MED ORDER — ACETAMINOPHEN 325 MG PO TABS: 650.0000 mg | ORAL_TABLET | Freq: Four times a day (QID) | ORAL | Status: DC | PRN

## 2023-07-07 MED ORDER — LACTATED RINGERS IV BOLUS (SEPSIS)
750.0000 mL | Freq: Once | INTRAVENOUS | Status: AC
  Administered 2023-07-07: 750 mL via INTRAVENOUS

## 2023-07-07 MED ORDER — LACTATED RINGERS IV BOLUS (SEPSIS): 1000.0000 mL | Freq: Once | INTRAVENOUS | Status: DC

## 2023-07-07 MED ORDER — LORAZEPAM 2 MG/ML IJ SOLN
0.5000 mg | INTRAMUSCULAR | Status: DC | PRN
  Administered 2023-07-07: 0.5 mg via INTRAVENOUS
  Filled 2023-07-07: qty 1

## 2023-07-07 MED ORDER — ONDANSETRON HCL 4 MG/2ML IJ SOLN
4.0000 mg | Freq: Four times a day (QID) | INTRAMUSCULAR | Status: DC | PRN
  Administered 2023-07-08 – 2023-07-09 (×3): 4 mg via INTRAVENOUS
  Filled 2023-07-07 (×3): qty 2

## 2023-07-07 MED ORDER — MORPHINE SULFATE (PF) 4 MG/ML IV SOLN
4.0000 mg | Freq: Once | INTRAVENOUS | Status: AC
  Administered 2023-07-07: 4 mg via INTRAVENOUS
  Filled 2023-07-07: qty 1

## 2023-07-07 MED ORDER — CHLORHEXIDINE GLUCONATE CLOTH 2 % EX PADS
6.0000 | MEDICATED_PAD | Freq: Every day | CUTANEOUS | Status: DC
  Administered 2023-07-08: 6 via TOPICAL

## 2023-07-07 MED ORDER — MORPHINE SULFATE (PF) 4 MG/ML IV SOLN
4.0000 mg | INTRAVENOUS | Status: DC | PRN
  Administered 2023-07-07: 4 mg via INTRAVENOUS
  Filled 2023-07-07: qty 1

## 2023-07-07 MED ORDER — RENA-VITE PO TABS
1.0000 | ORAL_TABLET | Freq: Every day | ORAL | Status: DC
  Administered 2023-07-07 – 2023-07-08 (×2): 1 via ORAL
  Filled 2023-07-07 (×2): qty 1

## 2023-07-07 MED ORDER — FUROSEMIDE 20 MG PO TABS
80.0000 mg | ORAL_TABLET | Freq: Every day | ORAL | Status: DC
  Administered 2023-07-08 – 2023-07-09 (×2): 80 mg via ORAL
  Filled 2023-07-07 (×2): qty 4

## 2023-07-07 MED ORDER — OXYCODONE HCL 5 MG PO TABS: 5.0000 mg | ORAL_TABLET | Freq: Four times a day (QID) | ORAL | Status: DC | PRN

## 2023-07-07 MED ORDER — SEVELAMER CARBONATE 800 MG PO TABS: 800.0000 mg | ORAL_TABLET | ORAL | Status: DC | PRN

## 2023-07-07 MED ORDER — INSULIN ASPART 100 UNIT/ML IJ SOLN: 0.0000 [IU] | Freq: Three times a day (TID) | INTRAMUSCULAR | Status: DC

## 2023-07-07 MED ORDER — SENNOSIDES-DOCUSATE SODIUM 8.6-50 MG PO TABS: 1.0000 | ORAL_TABLET | Freq: Every evening | ORAL | Status: DC | PRN

## 2023-07-07 MED ORDER — AMLODIPINE BESYLATE 10 MG PO TABS
10.0000 mg | ORAL_TABLET | Freq: Every day | ORAL | Status: DC
  Administered 2023-07-08 – 2023-07-09 (×2): 10 mg via ORAL
  Filled 2023-07-07 (×2): qty 1

## 2023-07-07 MED ORDER — ONDANSETRON HCL 4 MG/2ML IJ SOLN
INTRAMUSCULAR | Status: AC
  Administered 2023-07-07: 4 mg
  Filled 2023-07-07: qty 2

## 2023-07-07 MED ORDER — HEPARIN SODIUM (PORCINE) 5000 UNIT/ML IJ SOLN
5000.0000 [IU] | Freq: Three times a day (TID) | INTRAMUSCULAR | Status: AC
  Administered 2023-07-07: 5000 [IU] via SUBCUTANEOUS
  Filled 2023-07-07: qty 1

## 2023-07-07 MED ORDER — LACTATED RINGERS IV BOLUS (SEPSIS)
1000.0000 mL | Freq: Once | INTRAVENOUS | Status: AC
  Administered 2023-07-07: 1000 mL via INTRAVENOUS

## 2023-07-07 MED ORDER — BUMETANIDE 1 MG PO TABS
1.0000 mg | ORAL_TABLET | Freq: Two times a day (BID) | ORAL | Status: DC
  Administered 2023-07-08 – 2023-07-09 (×2): 1 mg via ORAL
  Filled 2023-07-07 (×5): qty 1

## 2023-07-07 MED ORDER — HYDROMORPHONE HCL 1 MG/ML IJ SOLN
1.0000 mg | INTRAMUSCULAR | Status: DC | PRN
  Administered 2023-07-07 – 2023-07-08 (×2): 1 mg via INTRAVENOUS
  Filled 2023-07-07 (×2): qty 1

## 2023-07-07 MED ORDER — SUMATRIPTAN SUCCINATE 50 MG PO TABS: 50.0000 mg | ORAL_TABLET | Freq: Every day | ORAL | Status: DC | PRN

## 2023-07-07 MED ORDER — SEVELAMER CARBONATE 800 MG PO TABS: 1600.0000 mg | ORAL_TABLET | Freq: Three times a day (TID) | ORAL | Status: DC

## 2023-07-07 MED ORDER — VANCOMYCIN HCL IN DEXTROSE 1-5 GM/200ML-% IV SOLN: 1000.0000 mg | Freq: Once | INTRAVENOUS | Status: DC

## 2023-07-07 MED ORDER — SEVELAMER CARBONATE 800 MG PO TABS: 800.0000 mg | ORAL_TABLET | ORAL | Status: DC

## 2023-07-07 MED ORDER — HYDROMORPHONE HCL 1 MG/ML IJ SOLN
1.0000 mg | Freq: Once | INTRAMUSCULAR | Status: AC
  Administered 2023-07-07: 1 mg via INTRAVENOUS
  Filled 2023-07-07: qty 1

## 2023-07-07 MED ORDER — VANCOMYCIN HCL 1500 MG/300ML IV SOLN
1500.0000 mg | Freq: Once | INTRAVENOUS | Status: AC
  Administered 2023-07-07: 1500 mg via INTRAVENOUS
  Filled 2023-07-07: qty 300

## 2023-07-07 MED ORDER — ONDANSETRON HCL 4 MG PO TABS: 4.0000 mg | ORAL_TABLET | Freq: Four times a day (QID) | ORAL | Status: DC | PRN

## 2023-07-07 MED ORDER — LOSARTAN POTASSIUM 50 MG PO TABS
100.0000 mg | ORAL_TABLET | Freq: Every day | ORAL | Status: DC
  Administered 2023-07-08 – 2023-07-09 (×2): 100 mg via ORAL
  Filled 2023-07-07 (×2): qty 2

## 2023-07-07 MED ORDER — HYDRALAZINE HCL 20 MG/ML IJ SOLN: 5.0000 mg | Freq: Four times a day (QID) | INTRAMUSCULAR | Status: DC | PRN

## 2023-07-07 MED ORDER — MONTELUKAST SODIUM 10 MG PO TABS
10.0000 mg | ORAL_TABLET | Freq: Every day | ORAL | Status: DC
  Administered 2023-07-07 – 2023-07-08 (×2): 10 mg via ORAL
  Filled 2023-07-07 (×2): qty 1

## 2023-07-07 MED ORDER — ATORVASTATIN CALCIUM 20 MG PO TABS
80.0000 mg | ORAL_TABLET | Freq: Every day | ORAL | Status: DC
  Administered 2023-07-08 – 2023-07-09 (×2): 80 mg via ORAL
  Filled 2023-07-07 (×2): qty 4

## 2023-07-07 MED ORDER — SODIUM CHLORIDE 0.9 % IV SOLN
1.0000 g | INTRAVENOUS | Status: DC
  Filled 2023-07-07: qty 10

## 2023-07-07 MED ORDER — LACTATED RINGERS IV SOLN: 150.0000 mL/h | INTRAVENOUS | Status: DC

## 2023-07-07 MED ORDER — MORPHINE SULFATE (PF) 4 MG/ML IV SOLN: 4.0000 mg | INTRAVENOUS | Status: DC | PRN

## 2023-07-07 NOTE — Hospital Course (Addendum)
Emma Thomas is a 27 year old female with history of hypertension, hyperlipidemia, end-stage renal disease, insulin-dependent diabetes mellitus type 1, heart failure preserved ejection fraction, hypertension, anemia of chronic disease, vitamin D deficiency, history of diabetic foot infection, who presents to the emergency department for chief concerns of fever, body aches, and shortness of breath for several days.  Vitals in the ED showed temperature of 99.6, respiration rate of 22, heart rate of 121, blood pressure 169, SpO2 100% on room air.  Serum sodium is 128, potassium 5.2, chloride 96, bicarb 10, BUN of 60, serum creatinine of 10.45, EGFR of 5, anion gap elevated at 22, serum calcium level is 7.1, nonfasting blood glucose 307.  COVID PCR was negative.  Blood cultures x 2 have been ordered.  Initial lactic acid 4.5.  ED treatment: Acetaminophen 1000 mg p.o. one-time dose, morphine 4 mg IV one-time dose at 1227, Dilaudid 1 mg IV one-time dose at 1327, Tron 4 mg IV one-time dose, cefepime 2 g IV, LR 1 L bolus, LR 750 mL bolus pending administration, LR 500 mL bolus one-time dose, vancomycin per pharmacy.

## 2023-07-07 NOTE — Assessment & Plan Note (Signed)
Patient had sinus tachycardia, increased respiration rate, markedly elevated lactic acid, with possible source of infection in the left foot Patient is status post 1.5 L fluid bolus with additional LR 750 mL bolus ordered pending administration per [facility] Continue with LR infusion at 150 mL/h, 20 hours ordered Blood cultures x 2 UA ordered was negative for leukocytes and nitrites, positive for few bacteria with squamous epithelial cells greater than 50 Continue with vancomycin and cefepime per pharmacy

## 2023-07-07 NOTE — H&P (Signed)
History and Physical   Emma Thomas:[id number] DOB: Nov 02, 1996 DOA: 07/07/2023  PCP: [name]  Outpatient Specialists: Dr. Myrtis Ser, [facility] Patient coming from: Home  I have personally briefly reviewed patient's old medical records in [facility] EMR.  Chief Concern: Foot pain, body aches and fever  HPI: Emma Thomas is a 27 year old female with history of hypertension, hyperlipidemia, end-stage renal disease, insulin-dependent diabetes mellitus type 1, heart failure preserved ejection fraction, hypertension, anemia of chronic disease, vitamin D deficiency, history of diabetic foot infection, who presents to the emergency department for chief concerns of fever, body aches, and shortness of breath for several days.  Vitals in the ED showed temperature of 99.6, respiration rate of 22, heart rate of 121, blood pressure 169, SpO2 100% on room air.  Serum sodium is 128, potassium 5.2, chloride 96, bicarb 10, BUN of 60, serum creatinine of 10.45, EGFR of 5, anion gap elevated at 22, serum calcium level is 7.1, nonfasting blood glucose 307.  COVID PCR was negative.  Blood cultures x 2 have been ordered.  Initial lactic acid 4.5.  ED treatment: Acetaminophen 1000 mg p.o. one-time dose, morphine 4 mg IV one-time dose at 1227, Dilaudid 1 mg IV one-time dose at 1327, Tron 4 mg IV one-time dose, cefepime 2 g IV, LR 1 L bolus, LR 750 mL bolus pending administration, LR 500 mL bolus one-time dose, vancomycin per pharmacy. ------------------------------ At bedside, she was able to tell  me her name, age, current location  She reports fever at home on Sunday, 101.2. She took tylenol.   She endorses nausea. She denies trauma to her foot or her person.   She denies known sick on contacts. She endorses nausea and vomiting, vomiting up her medications and yellow bile, and dry heaves. She has not been able to tolerate PO intake.   She denies diarrhea.  She denies  purulence of the left foot. She denies bleeding of her left foot.  Social history: She lives at home with her husband. She denies tobacco, etoh, and recreational drug use.   ROS: Constitutional: no weight change, + fever ENT/Mouth: no sore throat, no rhinorrhea Eyes: no eye pain, no vision changes Cardiovascular: no chest pain, no dyspnea,  no edema, no palpitations Respiratory: no cough, no sputum, no wheezing Gastrointestinal: + nausea, + vomiting, no diarrhea, no constipation Genitourinary: no urinary incontinence, no dysuria, no hematuria Musculoskeletal: no arthralgias, no myalgias Skin: no skin lesions, no pruritus, Neuro: + weakness, no loss of consciousness, no syncope Psych: no anxiety, no depression, + decrease appetite Heme/Lymph: no bruising, no bleeding  ED Course: Discussed with emergency medicine provider, patient requiring hospitalization for chief concerns of possible sepsis.  Assessment/Plan  Principal Problem:   Left foot pain Active Problems:   Severe sepsis (HCC)   ESRD on dialysis (HCC)   Diastolic dysfunction   Depression   Depression, major, recurrent, moderate (HCC)   History of depression   Hypertension   Anemia of chronic disease   Hyponatremia   Anemia due to chronic kidney disease   Hyperglycemia   Assessment and Plan:  * Left foot pain With suspected left foot cellulitis versus osteomyelitis Left foot MRI without contrast ordered, completed and pending read at the time of this dictation AM team to consult podiatry as appropriate  Severe sepsis [facility]) Patient had sinus tachycardia, increased respiration rate, markedly elevated lactic acid, with possible source of infection in the left foot Patient is status post 1.5 L fluid  bolus with additional LR 750 mL bolus ordered pending administration per [facility] Continue with LR infusion at 150 mL/h, 20 hours ordered Blood cultures x 2 UA ordered was negative for leukocytes and nitrites, positive for  few bacteria with squamous epithelial cells greater than 50 Continue with vancomycin and cefepime per pharmacy  Diastolic dysfunction Echo on 08/10/2022: Estimated ejection fraction 55 to 60%, grade 1 diastolic dysfunction  ESRD on dialysis [facility]) Nephrology has been consulted  Hyperglycemia Insulin-dependent diabetes mellitus Patient is on insulin pump Insulin SSI with at bedtime coverage, dialysis dosing ordered  Hyponatremia Status post 2.25 L LR bolus and LR infusion at 150 mL/h, for 3 to 4 hours Ordered sodium chloride infusion at 100 mL/h, 12 hours ordered Recheck BMP in a.m.  Hypertension Home amlodipine 10 mg daily, Bumex 1 mg p.o. twice daily, losartan 100 mg daily, furosemide 80 mg daily resumed Hydralazine 5 mg IV every 6 hours as needed for SBP greater 170, 3 days ordered  Chart reviewed.   DVT prophylaxis: Heparin 5000 units one-time dose ordered on admission.  Pending MRI of the left foot and possible surgery, a.m. team to reinitiate pharmacologic DVT prophylaxis when the benefits outweigh the risk Code Status: full code Diet: Renal/carb modified Family Communication: a phone call was offered, patient declined Disposition Plan: Pending clinical course Consults called: Nephrology Admission status: Telemetry medical, inpatient  Past Medical History:  Diagnosis Date   Anemia    Diabetes mellitus without complication (HCC)    Type 1 DM   Essential hypertension    Headache    Hypertension 03/04/2013   Neurologic disorder    Both feet   Neuromuscular disorder (HCC)    Recurrent UTI    Renal disorder    Past Surgical History:  Procedure Laterality Date   abscess removal     excision of bartholin cyst   AMPUTATION Left 11/23/2022   Procedure: LEFT GREAT TOE PARTIAL RAY AMPUTATION;  Surgeon: Linus Galas, DPM;  Location: [facility] ORS;  Service: Podiatry;  Laterality: Left;   CAPD INSERTION N/A 06/09/2022   Procedure: LAPAROSCOPIC INSERTION CONTINUOUS AMBULATORY  PERITONEAL DIALYSIS  (CAPD) CATHETER, PD rep to be present;  Surgeon: Henrene Dodge, MD;  Location: [facility] ORS;  Service: General;  Laterality: N/A;   CAPD INSERTION N/A 12/15/2022   Procedure: LAPAROSCOPIC INSERTION CONTINUOUS AMBULATORY PERITONEAL DIALYSIS  (CAPD) CATHETER;  Surgeon: Henrene Dodge, MD;  Location: [facility] ORS;  Service: General;  Laterality: N/A;   CAPD REMOVAL N/A 12/15/2022   Procedure: LAPAROSCOPIC REMOVAL CONTINUOUS AMBULATORY PERITONEAL DIALYSIS  (CAPD) CATHETER, removal of old catheter;  Surgeon: Henrene Dodge, MD;  Location: [facility] OR[facility];  Service: General;  Laterality: N/A;   CAPD REMOVAL N/A 01/08/2023   Procedure: LAPAROSCOPIC REMOVAL CONTINUOUS AMBULATORY PERITONEAL DIALYSIS  (CAPD) CATHETER;  Surgeon: Henrene Dodge, MD;  Location: [facility] OR[facility];  Service: General;  Laterality: N/A;   CAPD REVISION N/A 07/16/2022   Procedure: LAPAROSCOPIC REVISION CONTINUOUS AMBULATORY PERITONEAL DIALYSIS  (CAPD) CATHETER;  Surgeon: Henrene Dodge, MD;  Location: [facility] OR[facility];  Service: General;  Laterality: N/A;   CAPD REVISION N/A 11/24/2022   Procedure: LAPAROSCOPIC REVISION CONTINUOUS AMBULATORY PERITONEAL DIALYSIS  (CAPD) CATHETER;  Surgeon: Henrene Dodge, MD;  Location: [facility] OR[facility];  Service: General;  Laterality: N/A;   DIALYSIS/PERMA CATHETER INSERTION N/A 04/24/2022   Procedure: DIALYSIS/PERMA CATHETER INSERTION;  Surgeon: Renford Dills, MD;  Location: [facility] INVASIVE CV LAB;  Service: Cardiovascular;  Laterality: N/A;   DIALYSIS/PERMA CATHETER REMOVAL N/A 06/02/2022   Procedure: DIALYSIS/PERMA CATHETER REMOVAL;  Surgeon: Renford Dills, MD;  Location: [facility] INVASIVE CV LAB;  Service: Cardiovascular;  Laterality: N/A;   DIALYSIS/PERMA CATHETER REMOVAL N/A 12/28/2022   Procedure: DIALYSIS/PERMA CATHETER REMOVAL;  Surgeon: Annice Needy, MD;  Location: [facility] INVASIVE CV LAB;  Service: Cardiovascular;  Laterality: N/A;   INSERTION OF DIALYSIS CATHETER Right    ARM   IR FLUORO GUIDE CV LINE RIGHT   11/27/2022   IR US GUIDE VASC ACCESS RIGHT  11/27/2022   IRRIGATION AND DEBRIDEMENT FOOT Left 06/19/2023   Procedure: IRRIGATION AND DEBRIDEMENT FOOT PARTIAL 1ST RAY AMPUTATION;  Surgeon: Rosetta Posner, DPM;  Location: [facility] OR[facility];  Service: Orthopedics/Podiatry;  Laterality: Left;   Nexplanon  01/2011   UMBILICAL HERNIA REPAIR N/A 06/09/2022   Procedure: HERNIA REPAIR UMBILICAL ADULT;  Surgeon: Henrene Dodge, MD;  Location: [facility] OR[facility];  Service: General;  Laterality: N/A;   Social History:  reports that she has never smoked. She has been exposed to tobacco smoke. She has never used smokeless tobacco. She reports that she does not currently use alcohol after a past usage of about 2.0 standard drinks of alcohol per week. She reports that she does not use drugs.  No Known Allergies Family History  Problem Relation Age of Onset   Breast cancer Mother 49   Lung cancer Maternal Grandmother    Diabetes type II Paternal Grandmother    Family history: Family history reviewed and not pertinent  Prior to Admission medications   Medication Sig Start Date End Date Taking? Authorizing Provider  acetaminophen (TYLENOL) 500 MG tablet Take 2 tablets (1,000 mg total) by mouth every 6 (six) hours as needed for mild pain. 01/08/23   Henrene Dodge, MD  amLODipine (NORVASC) 10 MG tablet Take 1 tablet (10 mg total) by mouth daily. 04/28/22   Lurene Shadow, MD  atorvastatin (LIPITOR) 80 MG tablet Take 80 mg by mouth daily.    [provider]  bumetanide (BUMEX) 1 MG tablet Take 1 mg by mouth 2 (two) times daily.    [provider]  furosemide (LASIX) 80 MG tablet Take 80 mg by mouth daily.    [provider]  HUMALOG 100 UNIT/ML injection Inject 0-1 mLs (0-100 Units total) into the skin daily. Uses with Insulin Pump 04/09/23   Kathlen Mody, MD  losartan (COZAAR) 100 MG tablet Take 100 mg by mouth daily.    [provider]  montelukast (SINGULAIR) 10 MG tablet Take 10 mg by mouth at  bedtime.    [provider]  multivitamin (RENA-VIT) TABS tablet Take 1 tablet by mouth at bedtime. 06/21/23 09/19/23  Gillis Santa, MD  norethindrone-ethinyl estradiol (LOESTRIN) 1-20 MG-MCG tablet Take 1 tablet by mouth daily.    [provider]  ondansetron (ZOFRAN) 8 MG tablet Take 8 mg by mouth daily.    [provider]  sevelamer carbonate (RENVELA) 800 MG tablet Take 800-1,600 mg by mouth See admin instructions. Take 2 tablets (1600mg ) by mouth three times daily with meals and take 1 tablet (800mg ) by mouth daily with snacks    [provider]  SUMAtriptan (IMITREX) 50 MG tablet Take 50 mg by mouth daily as needed for migraine.    [provider]  Vitamin D, Ergocalciferol, (DRISDOL) 1.25 MG (50000 UNIT) CAPS capsule Take 1 capsule (50,000 Units total) by mouth every 7 (seven) days. 06/27/23 09/25/23  Gillis Santa, MD   Physical Exam: Vitals:   07/07/23 1430 07/07/23 1623 07/07/23 1818 07/07/23 1900  BP:   (!) 166/89  Pulse: (!) 107  (!) 102   Resp: (!) 30  (!) 24   Temp:  98.9 F (37.2 C) 98.8 F (37.1 C)   TempSrc:  Oral Oral   SpO2: 98%  98%   Weight:  69.5 kg    Height:    5\' 6"  (1.676 m)   Constitutional: appears frail, cachectic, mildly distressed and uncomfortable Eyes: PERRL, lids and conjunctivae normal ENMT: Mucous membranes are moist. Posterior pharynx clear of any exudate or lesions. Age-appropriate dentition. Hearing appropriate Neck: normal, supple, no masses, no thyromegaly Respiratory: clear to auscultation bilaterally, no wheezing, no crackles. Normal respiratory effort. No accessory muscle use.  Cardiovascular: Regular rate and rhythm, no murmurs / rubs / gallops. No extremity edema. 2+ pedal pulses. No carotid bruits.  Right upper extremity antecubital fossa fistula in place with appropriate bruit Abdomen: no tenderness, no masses palpated, no hepatosplenomegaly. Bowel sounds positive.  Musculoskeletal: no clubbing /  cyanosis. No joint deformity upper and lower extremities. Good ROM, no contractures, no atrophy. Normal muscle tone.  Skin:     Neurologic: Sensation intact. Strength 5/5 in all 4.  Psychiatric: Normal judgment and insight. Alert and oriented x 3. Normal mood.   EKG: independently reviewed, showing sinus tachycardia with rate of 119, QTc 483  Chest x-ray on Admission: I personally reviewed and I agree with radiologist reading as below.  DG Chest Port 1 View  Result Date: 07/07/2023 CLINICAL DATA:  Questionable sepsis - evaluate for abnormality fever and chills. EXAM: PORTABLE CHEST 1 VIEW COMPARISON:  08/10/2022. FINDINGS: Bilateral lung fields are clear. Bilateral costophrenic angles are clear. Normal cardio-mediastinal silhouette. No acute osseous abnormalities. The soft tissues are within normal limits. IMPRESSION: No active disease. Electronically Signed   By: Jules Schick M.D.   On: 07/07/2023 13:39   DG Foot 2 Views Left  Result Date: 07/07/2023 CLINICAL DATA:  L foot pain, fever.  Recent surgery-2 weeks back. EXAM: LEFT FOOT - 2 VIEW COMPARISON:  06/19/2023. FINDINGS: Redemonstration of amputation of great toe, near the proximal base of the first metatarsal. The resection margin is sharp. No focal bone erosions noted. There is interval disuse osteopenia. No evidence of focal soft tissue defect or air within the soft tissue at the surgical resection site/stump. 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 Otherwise, no acute fracture or dislocation. No aggressive osseous lesion. No significant degenerative changes of imaged joints. No focal soft tissue swelling. No radiopaque foreign bodies. IMPRESSION: 1. Redemonstration of great toe amputation. No radiographic evidence of osteomyelitis. Correlate clinically to determine the need for additional imaging with more sensitive modality such as MRI. 2. Redemonstration of avascular  necrosis of the second metatarsal head. Electronically Signed   By: Jules Schick M.D.   On: 07/07/2023 13:31    Labs on Admission: I have personally reviewed following labs  CBC: Recent Labs  Lab 07/07/23 1210  WBC 3.7*  NEUTROABS 3.3  HGB 8.2*  HCT 25.2*  MCV 91.6  PLT 304   Basic Metabolic Panel: Recent Labs  Lab 07/07/23 1348  NA 128*  K 5.2*  CL 96*  CO2 10*  GLUCOSE 307*  BUN 60*  CREATININE 10.45*  CALCIUM 7.1*   GFR: Estimated Creatinine Clearance: 7.5 mL/min (A) (by C-G formula based on SCr of 10.45 mg/dL (H)).  Liver Function Tests: Recent Labs  Lab 07/07/23 1348  AST 22  ALT 14  ALKPHOS 65  BILITOT 1.5*  PROT 7.1  ALBUMIN 2.7*  Coagulation Profile: Recent Labs  Lab 07/07/23 1348  INR 1.3*   Urine analysis:    Component Value Date/Time   COLORURINE YELLOW (A) 07/07/2023 1547   APPEARANCEUR CLOUDY (A) 07/07/2023 1547   LABSPEC 1.011 07/07/2023 1547   PHURINE 6.0 07/07/2023 1547   GLUCOSEU >=500 (A) 07/07/2023 1547   HGBUR SMALL (A) 07/07/2023 1547   BILIRUBINUR NEGATIVE 07/07/2023 1547   KETONESUR 20 (A) 07/07/2023 1547   PROTEINUR >=300 (A) 07/07/2023 1547   NITRITE NEGATIVE 07/07/2023 1547   LEUKOCYTESUR NEGATIVE 07/07/2023 1547   This document was prepared using Dragon Voice Recognition software and may include unintentional dictation errors.  Dr. Sedalia Muta [facility]  If 7PM-7AM, please contact overnight-coverage provider If 7AM-7PM, please contact day attending provider [email or web address]  07/07/2023, 7:35 PM

## 2023-07-07 NOTE — Assessment & Plan Note (Signed)
Echo on 08/10/2022: Estimated ejection fraction 55 to 60%, grade 1 diastolic dysfunction

## 2023-07-07 NOTE — Assessment & Plan Note (Signed)
-   Nephrology has been consulted

## 2023-07-07 NOTE — Progress Notes (Signed)
Elink following for sepsis protocol. 

## 2023-07-07 NOTE — Assessment & Plan Note (Addendum)
With suspected left foot cellulitis versus osteomyelitis Left foot MRI without contrast ordered, completed and pending read at the time of this dictation AM team to consult podiatry as appropriate

## 2023-07-07 NOTE — Assessment & Plan Note (Signed)
Insulin-dependent diabetes mellitus Patient is on insulin pump Insulin SSI with at bedtime coverage, dialysis dosing ordered

## 2023-07-07 NOTE — Assessment & Plan Note (Signed)
Status post 2.25 L LR bolus and LR infusion at 150 mL/h, for 3 to 4 hours Ordered sodium chloride infusion at 100 mL/h, 12 hours ordered Recheck BMP in a.m.

## 2023-07-07 NOTE — Progress Notes (Signed)
This Chap visited Pt while on routine rounding in ED. Pt lamented pain and desire some painkillers. Chap provided reflective listening and compassionate presence. Now the nurse comes in and attending the Pt.   07/07/23 1400  Spiritual Encounters  Type of Visit Initial  Care provided to: Patient  Conversation partners present during encounter Nurse  Referral source Chaplain assessment  Reason for visit Routine spiritual support  OnCall Visit No  Interventions  Spiritual Care Interventions Made Reflective listening;Established relationship of care and support  Intervention Outcomes  Outcomes Awareness of support;Connection to spiritual care;Reduced isolation  Spiritual Care Plan  Spiritual Care Issues Still Outstanding No further spiritual care needs at this time (see row info)

## 2023-07-07 NOTE — Consult Note (Signed)
Pharmacy Antibiotic Note  Emma Thomas is a 27 y.o. female admitted on 07/07/2023 with  fever, body aches, and shortness of breath .  Pharmacy has been consulted for Vancomycin and Cefepime dosing.  Plan: 1) Vancomycin 1500mg  IV x 1 to be given as loading dose. Patient received hemodialysis outpatient, nephrology has been consulted. Will order Vancomycin 750mg  IV to be given with each dialysis session once schedule has become more apparent.  2) Cefepime 1g IV Q24 hours  Weight: 69.5 kg (153 lb 3.5 oz)  Temp (24hrs), Avg:99.4 F (37.4 C), Min:98.9 F (37.2 C), Max:99.6 F (37.6 C)  Recent Labs  Lab 07/07/23 1210 07/07/23 1348  WBC 3.7*  --   CREATININE  --  10.45*  LATICACIDVEN 4.5*  --     Estimated Creatinine Clearance: 7.5 mL/min (A) (by C-G formula based on SCr of 10.45 mg/dL (H)).    No Known Allergies  Antimicrobials this admission: Vancomycin 8/21 >>  Cefepime 8/21 >>   Dose adjustments this admission: N/A  Microbiology results: 8/21 BCx: collected  Thank you for allowing pharmacy to be a part of this patient's care.  Harrell Lark A Rashaunda Rahl 07/07/2023 5:07 PM

## 2023-07-07 NOTE — Assessment & Plan Note (Signed)
Home amlodipine 10 mg daily, Bumex 1 mg p.o. twice daily, losartan 100 mg daily, furosemide 80 mg daily resumed Hydralazine 5 mg IV every 6 hours as needed for SBP greater 170, 3 days ordered

## 2023-07-07 NOTE — ED Provider Notes (Signed)
[facility] Provider Note    Event Date/Time   First MD Initiated Contact with Patient 07/07/23 1155     (approximate)   History      HPI  Emma Thomas is a 27 y.o. female with a history of of type 1 diabetes on an insulin pump, ESRD on dialysis, hypertension, anemia of chronic disease, and diabetic foot infection who presents with fever, body aches, and shortness of breath over the last few days.  The patient also reports persistent pain to her left foot where she recently had an infection.  The patient denies any significant cough.  She has had some nausea and vomiting but no diarrhea.  She denies any abdominal or chest pain.  She has no rashes or open wounds.  I reviewed the past medical records.  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  She was evaluated by podiatry, had IV antibiotics, and had a partial amputation and antibiotic bead application.  She was discharged on Augmentin.   Physical Exam   Triage Vital Signs: ED Triage Vitals  Encounter Vitals Group     BP 07/07/23 1158 (!) 169/92     Systolic BP Percentile --      Diastolic BP Percentile --      Pulse Rate 07/07/23 1158 (!) 121     Resp 07/07/23 1158 (!) 22     Temp 07/07/23 1158 99.6 F (37.6 C)     Temp Source 07/07/23 1158 Oral     SpO2 07/07/23 1158 100 %     Weight --      Height --      Head Circumference --      Peak Flow --      Pain Score 07/07/23 1200 10     Pain Loc --      Pain Education --      Exclude from Growth Chart --     Most recent vital signs: Vitals:   07/07/23 1235 07/07/23 1430  BP:    Pulse: (!) 116 (!) 107  Resp: (!) 25 (!) 30  Temp:    SpO2: 99% 98%     General: Alert and oriented, uncomfortable appearing but in no distress.  CV:  Good peripheral perfusion.  Tachycardic, regular rhythm. Resp:  Increased effort, no tori distress, lungs  CTAB. Abd:  Soft and nontender.  No distention.  Other:  Left medial foot appears swollen.  Recent surgical scar is clean/dry/intact.  No erythema, induration, or abnormal warmth.  No open wounds or drainage.   ED Results / Procedures / Treatments   Labs (all labs ordered are listed, but only abnormal results are displayed) Labs Reviewed  LACTIC ACID, PLASMA - Abnormal; Notable for the following components:      Result Value   Lactic Acid, Venous 4.5 (*)    All other components within normal limits  CBC WITH DIFFERENTIAL/PLATELET - Abnormal; Notable for the following components:   WBC 3.7 (*)    RBC 2.75 (*)    Hemoglobin 8.2 (*)    HCT 25.2 (*)    Lymphs Abs 0.4 (*)    Monocytes Absolute 0.0 (*)    All other components within normal limits  PROTIME-INR - Abnormal; Notable for the following components:   Prothrombin Time 16.4 (*)    INR 1.3 (*)    All other components within normal limits  COMPREHENSIVE METABOLIC PANEL -  Abnormal; Notable for the following components:   Sodium 128 (*)    Potassium 5.2 (*)    Chloride 96 (*)    CO2 10 (*)    Glucose, Bld 307 (*)    BUN 60 (*)    Creatinine, Ser 10.45 (*)    Calcium 7.1 (*)    Albumin 2.7 (*)    Total Bilirubin 1.5 (*)    GFR, Estimated 5 (*)    Anion gap 22 (*)    All other components within normal limits  SARS CORONAVIRUS 2 BY RT PCR  CULTURE, BLOOD (ROUTINE X 2)  CULTURE, BLOOD (ROUTINE X 2)  LACTIC ACID, PLASMA  URINALYSIS, W/ REFLEX TO CULTURE (INFECTION SUSPECTED)  POC URINE PREG, ED     EKG  ED ECG REPORT I, Dionne Bucy, the attending physician, personally viewed and interpreted this ECG.  Date: 07/07/2023 EKG Time: 1214 Rate: 119 Rhythm: Sinus tachycardia QRS Axis: normal Intervals: normal ST/T Wave abnormalities: normal Narrative Interpretation: no evidence of acute ischemia    RADIOLOGY  Chest x-ray: I independently viewed and interpreted the images; no focal consolidation or  edema  XR L foot: No acute findings   PROCEDURES:  Critical Care performed: Yes, see critical care procedure note(s)  .Critical Care  Performed by: Dionne Bucy, MD Authorized by: Dionne Bucy, MD   Critical care provider statement:    Critical care time (minutes):  30   Critical care time was exclusive of:  Separately billable procedures and treating other patients   Critical care was necessary to treat or prevent imminent or life-threatening deterioration of the following conditions:  Sepsis   Critical care was time spent personally by me on the following activities:  Development of treatment plan with patient or surrogate, discussions with consultants, evaluation of patient's response to treatment, examination of patient, ordering and review of laboratory studies, ordering and review of radiographic studies, ordering and performing treatments and interventions, pulse oximetry, re-evaluation of patient's condition and review of old charts   Care discussed with: admitting provider      MEDICATIONS ORDERED IN ED: Medications  vancomycin (VANCOREADY) IVPB 1500 mg/300 mL (1,500 mg Intravenous New Bag/Given 07/07/23 1415)  lactated ringers bolus 1,000 mL (0 mLs Intravenous Stopped 07/07/23 1409)    And  lactated ringers bolus 750 mL (has no administration in time range)  lactated ringers infusion (has no administration in time range)  acetaminophen (TYLENOL) tablet 650 mg (has no administration in time range)    Or  acetaminophen (TYLENOL) suppository 650 mg (has no administration in time range)  ondansetron (ZOFRAN) tablet 4 mg (has no administration in time range)    Or  ondansetron (ZOFRAN) injection 4 mg (has no administration in time range)  senna-docusate (Senokot-S) tablet 1 tablet (has no administration in time range)  ceFEPIme (MAXIPIME) 2 g in sodium chloride 0.9 % 100 mL IVPB (0 g Intravenous Stopped 07/07/23 1312)  acetaminophen (TYLENOL) tablet 1,000 mg  (1,000 mg Oral Given 07/07/23 1228)  lactated ringers bolus 500 mL (0 mLs Intravenous Stopped 07/07/23 1345)  morphine (PF) 4 MG/ML injection 4 mg (4 mg Intravenous Given 07/07/23 1227)  ondansetron (ZOFRAN) 4 MG/2ML injection (4 mg  Given 07/07/23 1228)  HYDROmorphone (DILAUDID) injection 1 mg (1 mg Intravenous Given 07/07/23 1327)     IMPRESSION / MDM / ASSESSMENT AND PLAN / ED COURSE  I reviewed the triage vital signs and the nursing notes.  27 year old female with PMH as noted above presents with fever, malaise,  increased shortness of breath, and persistent left foot pain after recent treatment for osteomyelitis.  On exam the patient is tachycardic with low-grade fever and hypertension.  Recent surgical site to the left wound appears intact and there is no erythema or other cutaneous findings.  Lungs are clear to auscultation.  The patient is alert and oriented.  Differential diagnosis includes, but is not limited to, acute infection/sepsis, possible recurrent or worsening osteomyelitis, pneumonia, COVID-19, other viral syndrome, UTI, less likely GI source.  Although the patient is short of breath and tachycardic I have a lower suspicion for PE.  She has no DVT symptoms or chest pain.    We will obtain lab workup, give fluids, antipyretic, pain medication, obtain x-rays of the left foot and reassess.  Patient's presentation is most consistent with acute presentation with potential threat to life or bodily function.  The patient is on the cardiac monitor to evaluate for evidence of arrhythmia and/or significant heart rate changes.  ----------------------------------------- 3:22 PM on 07/07/2023 -----------------------------------------  Chest x-ray shows no acute findings.  X-ray of the foot shows no evidence of osteomyelitis although patient likely will need additional imaging.  Lactate is significant elevated.  I have ordered fluids and empiric antibiotics per the sepsis protocol.  CBC shows  WBC count of 3.7.  COVID is negative.  The patient will need admission for further workup and treatment.  I consulted Dr. Sedalia Muta from the hospitalist service; based our discussion she agrees to evaluate the patient for admission.   FINAL CLINICAL IMPRESSION(S) / ED DIAGNOSES   Final diagnoses:  Sepsis, due to unspecified organism, unspecified whether acute organ dysfunction present [facility])     Rx / DC Orders   ED Discharge Orders     None        Note:  This document was prepared using Dragon voice recognition software and may include unintentional dictation errors.    Dionne Bucy, MD 07/07/23 [phone]

## 2023-07-07 NOTE — Consult Note (Signed)
CODE SEPSIS - PHARMACY COMMUNICATION  **Broad Spectrum Antibiotics should be administered within 1 hour of Sepsis diagnosis**  Time Code Sepsis Called/Page Received: 1156  Antibiotics Ordered: Cefepime, Vancomycin  Time of 1st antibiotic administration: 1234  Additional action taken by pharmacy: none  If necessary, Name of Provider/Nurse Contacted: n/a    Bettey Costa ,PharmD Clinical Pharmacist  07/07/2023  12:51 PM

## 2023-07-08 DIAGNOSIS — L089 Local infection of the skin and subcutaneous tissue, unspecified: Secondary | ICD-10-CM | POA: Diagnosis not present

## 2023-07-08 DIAGNOSIS — Z992 Dependence on renal dialysis: Secondary | ICD-10-CM

## 2023-07-08 DIAGNOSIS — A419 Sepsis, unspecified organism: Secondary | ICD-10-CM

## 2023-07-08 DIAGNOSIS — E1010 Type 1 diabetes mellitus with ketoacidosis without coma: Secondary | ICD-10-CM | POA: Diagnosis not present

## 2023-07-08 DIAGNOSIS — R6520 Severe sepsis without septic shock: Secondary | ICD-10-CM

## 2023-07-08 DIAGNOSIS — E11628 Type 2 diabetes mellitus with other skin complications: Secondary | ICD-10-CM

## 2023-07-08 DIAGNOSIS — Z794 Long term (current) use of insulin: Secondary | ICD-10-CM

## 2023-07-08 DIAGNOSIS — E1122 Type 2 diabetes mellitus with diabetic chronic kidney disease: Secondary | ICD-10-CM | POA: Diagnosis not present

## 2023-07-08 DIAGNOSIS — N186 End stage renal disease: Secondary | ICD-10-CM

## 2023-07-08 DIAGNOSIS — D638 Anemia in other chronic diseases classified elsewhere: Secondary | ICD-10-CM

## 2023-07-08 DIAGNOSIS — M79672 Pain in left foot: Secondary | ICD-10-CM | POA: Diagnosis not present

## 2023-07-08 LAB — BASIC METABOLIC PANEL
Anion gap: 23 — ABNORMAL HIGH (ref 5–15)
Anion gap: 23 — ABNORMAL HIGH (ref 5–15)
Anion gap: 21 — ABNORMAL HIGH (ref 5–15)
BUN: 69 mg/dL — ABNORMAL HIGH (ref 6–20)
BUN: 34 mg/dL — ABNORMAL HIGH (ref 6–20)
BUN: 41 mg/dL — ABNORMAL HIGH (ref 6–20)
CO2: 7 mmol/L — ABNORMAL LOW (ref 22–32)
CO2: 17 mmol/L — ABNORMAL LOW (ref 22–32)
CO2: 20 mmol/L — ABNORMAL LOW (ref 22–32)
Calcium: 6.5 mg/dL — ABNORMAL LOW (ref 8.9–10.3)
Calcium: 7.6 mg/dL — ABNORMAL LOW (ref 8.9–10.3)
Calcium: 7.5 mg/dL — ABNORMAL LOW (ref 8.9–10.3)
Chloride: 102 mmol/L (ref 98–111)
Chloride: 92 mmol/L — ABNORMAL LOW (ref 98–111)
Chloride: 92 mmol/L — ABNORMAL LOW (ref 98–111)
Creatinine, Ser: 10.86 mg/dL — ABNORMAL HIGH (ref 0.44–1.00)
Creatinine, Ser: 5.26 mg/dL — ABNORMAL HIGH (ref 0.44–1.00)
Creatinine, Ser: 6.68 mg/dL — ABNORMAL HIGH (ref 0.44–1.00)
GFR, Estimated: 4 mL/min — ABNORMAL LOW (ref >60)
GFR, Estimated: 11 mL/min — ABNORMAL LOW (ref >60)
GFR, Estimated: 8 mL/min — ABNORMAL LOW (ref >60)
Glucose, Bld: 370 mg/dL — ABNORMAL HIGH (ref 70–99)
Glucose, Bld: 221 mg/dL — ABNORMAL HIGH (ref 70–99)
Glucose, Bld: 204 mg/dL — ABNORMAL HIGH (ref 70–99)
Potassium: 7.3 mmol/L (ref 3.5–5.1)
Potassium: 3.9 mmol/L (ref 3.5–5.1)
Potassium: 3.6 mmol/L (ref 3.5–5.1)
Sodium: 132 mmol/L — ABNORMAL LOW (ref 135–145)
Sodium: 132 mmol/L — ABNORMAL LOW (ref 135–145)
Sodium: 133 mmol/L — ABNORMAL LOW (ref 135–145)

## 2023-07-08 LAB — RENAL FUNCTION PANEL
Albumin: 2.4 g/dL — ABNORMAL LOW (ref 3.5–5.0)
Anion gap: 26 — ABNORMAL HIGH (ref 5–15)
BUN: 37 mg/dL — ABNORMAL HIGH (ref 6–20)
CO2: 14 mmol/L — ABNORMAL LOW (ref 22–32)
Calcium: 7.3 mg/dL — ABNORMAL LOW (ref 8.9–10.3)
Chloride: 92 mmol/L — ABNORMAL LOW (ref 98–111)
Creatinine, Ser: 6 mg/dL — ABNORMAL HIGH (ref 0.44–1.00)
GFR, Estimated: 9 mL/min — ABNORMAL LOW (ref >60)
Glucose, Bld: 344 mg/dL — ABNORMAL HIGH (ref 70–99)
Phosphorus: 6.5 mg/dL — ABNORMAL HIGH (ref 2.5–4.6)
Potassium: 4.3 mmol/L (ref 3.5–5.1)
Sodium: 132 mmol/L — ABNORMAL LOW (ref 135–145)

## 2023-07-08 LAB — CBC
HCT: 20.2 % — ABNORMAL LOW (ref 36.0–46.0)
HCT: 19.6 % — ABNORMAL LOW (ref 36.0–46.0)
Hemoglobin: 6.5 g/dL — ABNORMAL LOW (ref 12.0–15.0)
Hemoglobin: 6.8 g/dL — ABNORMAL LOW (ref 12.0–15.0)
MCH: 30.2 pg (ref 26.0–34.0)
MCH: 30.4 pg (ref 26.0–34.0)
MCHC: 32.2 g/dL (ref 30.0–36.0)
MCHC: 34.7 g/dL (ref 30.0–36.0)
MCV: 94 fL (ref 80.0–100.0)
MCV: 87.5 fL (ref 80.0–100.0)
Platelets: 247 10*3/uL (ref 150–400)
Platelets: 247 10*3/uL (ref 150–400)
RBC: 2.15 MIL/uL — ABNORMAL LOW (ref 3.87–5.11)
RBC: 2.24 MIL/uL — ABNORMAL LOW (ref 3.87–5.11)
RDW: 12.4 % (ref 11.5–15.5)
RDW: 12.3 % (ref 11.5–15.5)
WBC: 22.3 10*3/uL — ABNORMAL HIGH (ref 4.0–10.5)
WBC: 16.9 10*3/uL — ABNORMAL HIGH (ref 4.0–10.5)
nRBC: 0 % (ref 0.0–0.2)
nRBC: 0 % (ref 0.0–0.2)

## 2023-07-08 LAB — GLUCOSE, CAPILLARY
Glucose-Capillary: 282 mg/dL — ABNORMAL HIGH (ref 70–99)
Glucose-Capillary: 333 mg/dL — ABNORMAL HIGH (ref 70–99)
Glucose-Capillary: 306 mg/dL — ABNORMAL HIGH (ref 70–99)
Glucose-Capillary: 253 mg/dL — ABNORMAL HIGH (ref 70–99)
Glucose-Capillary: 243 mg/dL — ABNORMAL HIGH (ref 70–99)
Glucose-Capillary: 173 mg/dL — ABNORMAL HIGH (ref 70–99)
Glucose-Capillary: 149 mg/dL — ABNORMAL HIGH (ref 70–99)
Glucose-Capillary: 127 mg/dL — ABNORMAL HIGH (ref 70–99)

## 2023-07-08 LAB — PREPARE RBC (CROSSMATCH)

## 2023-07-08 LAB — MRSA NEXT GEN BY PCR, NASAL: MRSA by PCR Next Gen: NOT DETECTED

## 2023-07-08 LAB — BETA-HYDROXYBUTYRIC ACID
Beta-Hydroxybutyric Acid: 5.97 mmol/L — ABNORMAL HIGH (ref 0.05–0.27)
Beta-Hydroxybutyric Acid: >8 mmol/L — ABNORMAL HIGH (ref 0.05–0.27)

## 2023-07-08 LAB — HEPATITIS B SURFACE ANTIGEN: Hepatitis B Surface Ag: NONREACTIVE

## 2023-07-08 MED ORDER — SODIUM CHLORIDE 0.9 % IV SOLN
1.0000 g | INTRAVENOUS | Status: DC
  Administered 2023-07-08: 1 g via INTRAVENOUS
  Filled 2023-07-08 (×3): qty 10

## 2023-07-08 MED ORDER — HEPARIN SODIUM (PORCINE) 5000 UNIT/ML IJ SOLN
5000.0000 [IU] | Freq: Three times a day (TID) | INTRAMUSCULAR | Status: DC
  Administered 2023-07-08 – 2023-07-09 (×2): 5000 [IU] via SUBCUTANEOUS
  Filled 2023-07-08 (×2): qty 1

## 2023-07-08 MED ORDER — ACETAMINOPHEN 325 MG PO TABS: 650.0000 mg | ORAL_TABLET | Freq: Four times a day (QID) | ORAL | Status: DC | PRN

## 2023-07-08 MED ORDER — LACTATED RINGERS IV BOLUS: 20.0000 mL/kg | Freq: Once | INTRAVENOUS | Status: DC

## 2023-07-08 MED ORDER — CEFAZOLIN SODIUM-DEXTROSE 2-4 GM/100ML-% IV SOLN
2.0000 g | INTRAVENOUS | Status: AC
  Administered 2023-07-09: 2 g via INTRAVENOUS

## 2023-07-08 MED ORDER — VANCOMYCIN HCL 750 MG/150ML IV SOLN
750.0000 mg | Freq: Once | INTRAVENOUS | Status: AC
  Administered 2023-07-08: 750 mg via INTRAVENOUS
  Filled 2023-07-08: qty 150

## 2023-07-08 MED ORDER — NORETHIN-ETH ESTRADIOL-FE 0.4-35 MG-MCG PO CHEW: 1.0000 | CHEWABLE_TABLET | Freq: Every day | ORAL | Status: DC

## 2023-07-08 MED ORDER — HYDROMORPHONE HCL 1 MG/ML IJ SOLN
1.0000 mg | INTRAMUSCULAR | Status: DC | PRN
  Administered 2023-07-08 – 2023-07-09 (×7): 1 mg via INTRAVENOUS
  Filled 2023-07-08 (×7): qty 1

## 2023-07-08 MED ORDER — LACTATED RINGERS IV SOLN: INTRAVENOUS | Status: DC

## 2023-07-08 MED ORDER — DEXTROSE 50 % IV SOLN: 0.0000 mL | INTRAVENOUS | Status: DC | PRN

## 2023-07-08 MED ORDER — DEXTROSE IN LACTATED RINGERS 5 % IV SOLN: INTRAVENOUS | Status: DC

## 2023-07-08 MED ORDER — ACETAMINOPHEN 650 MG RE SUPP: 650.0000 mg | Freq: Four times a day (QID) | RECTAL | Status: DC | PRN

## 2023-07-08 MED ORDER — SODIUM CHLORIDE 0.9% IV SOLUTION: Freq: Once | INTRAVENOUS | Status: DC

## 2023-07-08 MED ORDER — HEPARIN SODIUM (PORCINE) 1000 UNIT/ML DIALYSIS: 1000.0000 [IU] | INTRAMUSCULAR | Status: DC | PRN

## 2023-07-08 MED ORDER — ALTEPLASE 2 MG IJ SOLR: 2.0000 mg | Freq: Once | INTRAMUSCULAR | Status: DC | PRN

## 2023-07-08 MED ORDER — INSULIN REGULAR(HUMAN) IN NACL 100-0.9 UT/100ML-% IV SOLN
INTRAVENOUS | Status: DC
  Administered 2023-07-08: 6.5 [IU]/h via INTRAVENOUS
  Administered 2023-07-09: 0.5 [IU]/h via INTRAVENOUS
  Filled 2023-07-08 (×2): qty 100

## 2023-07-08 MED ORDER — NORETHINDRONE ACET-ETHINYL EST 1-20 MG-MCG PO TABS
1.0000 | ORAL_TABLET | Freq: Every day | ORAL | Status: DC
  Administered 2023-07-08: 1 via ORAL
  Filled 2023-07-08: qty 1

## 2023-07-08 NOTE — Inpatient Diabetes Management (Signed)
Inpatient Diabetes Program Recommendations  AACE/ADA: New Consensus Statement on Inpatient Glycemic Control (2015)  Target Ranges:  Prepandial:   less than 140 mg/dL      Peak postprandial:   less than 180 mg/dL (1-2 hours)      Critically ill patients:  140 - 180 mg/dL   Lab Results  Component Value Date   GLUCAP 351 (H) 07/07/2023   HGBA1C 8.5 (H) 06/18/2023    Latest Reference Range & Units 07/08/23 06:22  Sodium 135 - 145 mmol/L 132 (L)  Potassium 3.5 - 5.1 mmol/L 7.3 (HH)  Chloride 98 - 111 mmol/L 102  CO2 22 - 32 mmol/L 7 (L)  Glucose 70 - 99 mg/dL 409 (H)  BUN 6 - 20 mg/dL 69 (H)  Creatinine 8.11 - 1.00 mg/dL 91.47 (H)  Calcium 8.9 - 10.3 mg/dL 6.5 (L)  Anion gap 5 - 15  23 (H)  (HH): Data is critically high (L): Data is abnormally low (H): Data is abnormally high  Review of Glycemic Control  Diabetes history: DM1 Outpatient Diabetes medications: T-Slim insulin pump with Humalog; Dexcom G7 CGM  Current orders for Inpatient glycemic control: Novolog 0-6 units tid, 0-5 units hs, in HD now  Inpatient Diabetes Program Recommendations:   NOTE: Patient has DM1 and sees [facility] for DM management; patient had televisit with Demaris Callander, PA on 04/06/23. Per office note on 04/06/23, patient's CBGs had been running high so "HIGH" profile was created. The following should be pump settings:   Dexcom - linked to clinic Tandem - phone app - linked to clinic  Normal         Basal   Insulin Sensitivity   Carb Ratio 12a  0.5      65                            8 3a    0.5      65                            8 6a    0.6      50                            8 11a   0.6     50                            8 4p     0.8     40                            7 9p     0.6     55                             8 Total basal: 17.6 units/24 hours  HIGH         Basal    Insulin Sensitivity   Carb Ratio 12a  0.8        40                           7 8a    0.9        35  6 4p    1.1        30                           6  9p    0.9        35                           7  Total basal: 21.8 units/24 hours   Patient was inpatient 04/07/23-04/08/23 with DKA and was seen by inpatient diabetes team on 04/08/23 during that admission. Patient admitted on 06/18/23 with diabetic foot infection.  Now returns 07/07/23 with symptoms from diabetic foot infection.  Thank you, Billy Fischer. Sherline Eberwein, RN, MSN, CDE  Diabetes Coordinator Inpatient Glycemic Control Team Team Pager [phone] (8am-5pm) 07/08/2023 10:14 AM

## 2023-07-08 NOTE — Consult Note (Signed)
ORTHOPAEDIC CONSULTATION  REQUESTING PHYSICIAN: Enedina Finner, MD  Chief Complaint: Left foot pain body aches and fever  HPI: Emma Thomas is a 27 y.o. female who complains of worsening left foot pain body aches and fever.  Admitted through the ER.  Patient is status post partial first ray amputation.  In the outpatient clinic she has been followed and has had recent areas of draining and infection with a sinus tract into her foot.  Upon admission swelling noted x-rays and MRI was performed.  Concern for abscess within the soft tissue of the first ray amputation site.  Past Medical History:  Diagnosis Date   Anemia    Diabetes mellitus without complication (HCC)    Type 1 DM   Essential hypertension    Headache    Hypertension 03/04/2013   Neurologic disorder    Both feet   Neuromuscular disorder (HCC)    Recurrent UTI    Renal disorder    Past Surgical History:  Procedure Laterality Date   abscess removal     excision of bartholin cyst   AMPUTATION Left 11/23/2022   Procedure: LEFT GREAT TOE PARTIAL RAY AMPUTATION;  Surgeon: Linus Galas, DPM;  Location: [facility];  Service: Podiatry;  Laterality: Left;   CAPD INSERTION N/A 06/09/2022   Procedure: LAPAROSCOPIC INSERTION CONTINUOUS AMBULATORY PERITONEAL DIALYSIS  (CAPD) CATHETER, PD rep to be present;  Surgeon: Henrene Dodge, MD;  Location: [facility];  Service: General;  Laterality: N/A;   CAPD INSERTION N/A 12/15/2022   Procedure: LAPAROSCOPIC INSERTION CONTINUOUS AMBULATORY PERITONEAL DIALYSIS  (CAPD) CATHETER;  Surgeon: Henrene Dodge, MD;  Location: [facility];  Service: General;  Laterality: N/A;   CAPD REMOVAL N/A 12/15/2022   Procedure: LAPAROSCOPIC REMOVAL CONTINUOUS AMBULATORY PERITONEAL DIALYSIS  (CAPD) CATHETER, removal of old catheter;  Surgeon: Henrene Dodge, MD;  Location: [facility];  Service: General;  Laterality: N/A;   CAPD REMOVAL N/A 01/08/2023   Procedure: LAPAROSCOPIC REMOVAL CONTINUOUS AMBULATORY PERITONEAL DIALYSIS   (CAPD) CATHETER;  Surgeon: Henrene Dodge, MD;  Location: [facility];  Service: General;  Laterality: N/A;   CAPD REVISION N/A 07/16/2022   Procedure: LAPAROSCOPIC REVISION CONTINUOUS AMBULATORY PERITONEAL DIALYSIS  (CAPD) CATHETER;  Surgeon: Henrene Dodge, MD;  Location: [facility];  Service: General;  Laterality: N/A;   CAPD REVISION N/A 11/24/2022   Procedure: LAPAROSCOPIC REVISION CONTINUOUS AMBULATORY PERITONEAL DIALYSIS  (CAPD) CATHETER;  Surgeon: Henrene Dodge, MD;  Location: [facility];  Service: General;  Laterality: N/A;   DIALYSIS/PERMA CATHETER INSERTION N/A 04/24/2022   Procedure: DIALYSIS/PERMA CATHETER INSERTION;  Surgeon: Renford Dills, MD;  Location: [facility];  Service: Cardiovascular;  Laterality: N/A;   DIALYSIS/PERMA CATHETER REMOVAL N/A 06/02/2022   Procedure: DIALYSIS/PERMA CATHETER REMOVAL;  Surgeon: Renford Dills, MD;  Location: [facility];  Service: Cardiovascular;  Laterality: N/A;   DIALYSIS/PERMA CATHETER REMOVAL N/A 12/28/2022   Procedure: DIALYSIS/PERMA CATHETER REMOVAL;  Surgeon: Annice Needy, MD;  Location: [facility];  Service: Cardiovascular;  Laterality: N/A;   INSERTION OF DIALYSIS CATHETER Right    ARM   IR FLUORO GUIDE CV LINE RIGHT  11/27/2022   IR US GUIDE VASC ACCESS RIGHT  11/27/2022   IRRIGATION AND DEBRIDEMENT FOOT Left 06/19/2023   Procedure: IRRIGATION AND DEBRIDEMENT FOOT PARTIAL 1ST RAY AMPUTATION;  Surgeon: Rosetta Posner, DPM;  Location: [facility];  Service: Orthopedics/Podiatry;  Laterality: Left;   Nexplanon  01/2011   UMBILICAL HERNIA REPAIR N/A 06/09/2022   Procedure: HERNIA REPAIR UMBILICAL ADULT;  Surgeon: Henrene Dodge, MD;  Location: [facility];  Service: General;  Laterality: N/A;   Social History   Socioeconomic History   Marital status: Married    Spouse name: Cameo   Number of children: 1   Years of education: Not on file   Highest education level: Not on file  Occupational History   Not on file   Tobacco Use   Smoking status: Never    Passive exposure: Past   Smokeless tobacco: Never  Vaping Use   Vaping status: Never Used  Substance and Sexual Activity   Alcohol use: Not Currently    Alcohol/week: 2.0 standard drinks of alcohol    Types: 2 Shots of liquor per week   Drug use: No   Sexual activity: Yes    Birth control/protection: None  Other Topics Concern   Not on file  Social History Narrative   Not on file   Social Determinants of Health   Financial Resource Strain: Low Risk  (05/31/2023)   Received [facility]   Overall Financial Resource Strain (CARDIA)    Difficulty of Paying Living Expenses: Not hard at all  Food Insecurity: No Food Insecurity (07/07/2023)   Hunger Vital Sign    Worried About Running Out of Food in the Last Year: Never true    Ran Out of Food in the Last Year: Never true  Transportation Needs: No Transportation Needs (07/07/2023)   PRAPARE - Administrator, Civil Service (Medical): No    Lack of Transportation (Non-Medical): No  Physical Activity: Not on file  Stress: Not on file  Social Connections: Not on file   Family History  Problem Relation Age of Onset   Breast cancer Mother 59   Lung cancer Maternal Grandmother    Diabetes type II Paternal Grandmother    No Known Allergies Prior to Admission medications   Medication Sig Start Date End Date Taking? Authorizing Provider  acetaminophen (TYLENOL) 500 MG tablet Take 2 tablets (1,000 mg total) by mouth every 6 (six) hours as needed for mild pain. 01/08/23   Henrene Dodge, MD  amLODipine (NORVASC) 10 MG tablet Take 1 tablet (10 mg total) by mouth daily. 04/28/22   Lurene Shadow, MD  atorvastatin (LIPITOR) 80 MG tablet Take 80 mg by mouth daily.    [provider]  bumetanide (BUMEX) 1 MG tablet Take 1 mg by mouth 2 (two) times daily.    [provider]  furosemide (LASIX) 80 MG tablet Take 80 mg by mouth daily.    [provider]  HUMALOG 100 UNIT/ML injection Inject 0-1 mLs (0-100 Units total) into the skin daily. Uses with Insulin Pump 04/09/23   Kathlen Mody, MD  losartan (COZAAR) 100 MG tablet Take 100 mg by mouth daily.    [provider]  montelukast (SINGULAIR) 10 MG tablet Take 10 mg by mouth at bedtime.    [provider]  multivitamin (RENA-VIT) TABS tablet Take 1 tablet by mouth at bedtime. 06/21/23 09/19/23  Gillis Santa, MD  norethindrone-ethinyl estradiol (LOESTRIN) 1-20 MG-MCG tablet Take 1 tablet by mouth daily.    [provider]  ondansetron (ZOFRAN) 8 MG tablet Take 8 mg by mouth daily.    [provider]  sevelamer carbonate (RENVELA) 800 MG tablet Take 800-1,600 mg by mouth See admin instructions. Take 2 tablets (1600mg ) by mouth three times daily with meals and take 1 tablet (800mg ) by mouth daily with snacks    [provider]  SUMAtriptan (IMITREX) 50 MG tablet Take 50 mg by mouth daily as needed for migraine.    [provider]  Vitamin D, Ergocalciferol, (DRISDOL) 1.25 MG (50000 UNIT) CAPS capsule Take 1 capsule (50,000 Units total) by mouth every 7 (seven) days. 06/27/23 09/25/23  Gillis Santa, MD   MR FOOT LEFT WO CONTRAST  Result Date: 07/08/2023 CLINICAL DATA:  Fever and left foot pain. Recent first ray amputation. EXAM: MRI OF THE LEFT FOOT WITHOUT CONTRAST TECHNIQUE: Multiplanar, multisequence MR imaging of the left forefoot was performed. No intravenous contrast was administered. COMPARISON:  Left foot x-rays from same day. MRI left foot dated June 18, 2023. FINDINGS: Bones/Joint/Cartilage Prior first ray amputation with small residual first metatarsal base. New marrow edema within the residual base, as well as the distal medial cuneiform. 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 No fracture or dislocation. No joint effusion. Ligaments Second through fifth toe collateral ligaments are intact. Lisfranc ligament is intact.  Muscles and Tendons Second through fifth flexor and extensor tendons are intact. No tenosynovitis. Increased T2 signal within the intrinsic muscles of the forefoot, nonspecific, but likely related to diabetic muscle changes. Soft tissue Recurrent 4.5 x 2.9 x 2.5 cm complex fluid collection in the recent first metatarsal amputation surgical bed (series 8, image 10; series 9, images 9-10). Unchanged sinus tract extending to the plantar surface. No soft tissue mass. IMPRESSION: 1. Prior first ray amputation with small residual first metatarsal base. New marrow edema within the residual base, as well as the distal medial cuneiform, concerning for osteomyelitis. 2. Recurrent 4.5 x 2.9 x 2.5 cm abscess in the recent first metatarsal amputation surgical bed. Unchanged sinus tract extending to the plantar surface. 3. 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 Electronically Signed   By: Obie Dredge M.D.   On: 07/08/2023 08:25   DG Chest Port 1 View  Result Date: 07/07/2023 CLINICAL DATA:  Questionable sepsis - evaluate for abnormality fever and chills. EXAM: PORTABLE CHEST 1 VIEW COMPARISON:  08/10/2022. FINDINGS: Bilateral lung fields are clear. Bilateral costophrenic angles are clear. Normal cardio-mediastinal silhouette. No acute osseous abnormalities. The soft tissues are within normal limits. IMPRESSION: No active disease. Electronically Signed   By: Jules Schick M.D.   On: 07/07/2023 13:39   DG Foot 2 Views Left  Result Date: 07/07/2023 CLINICAL DATA:  L foot pain, fever.  Recent surgery-2 weeks back. EXAM: LEFT FOOT - 2 VIEW COMPARISON:  06/19/2023. FINDINGS: Redemonstration of amputation of great toe, near the proximal base of the first metatarsal. The resection margin is sharp. No focal bone erosions noted. There is interval disuse osteopenia. No evidence of focal soft tissue defect or air within the soft tissue at the surgical resection site/stump. Redemonstration of avascular  necrosis of the second metatarsal head without significant interval change. Otherwise, no acute fracture or dislocation. No aggressive osseous lesion. No significant degenerative changes of imaged joints. No focal soft tissue swelling. No radiopaque foreign bodies. IMPRESSION: 1. Redemonstration of great toe amputation. No radiographic evidence of osteomyelitis. Correlate clinically to determine the need for additional imaging with more sensitive modality such as MRI. 2. 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 Electronically Signed   By: Jules Schick M.D.   On: 07/07/2023 13:31    Positive ROS: All other systems have been reviewed and were otherwise negative with the exception of those mentioned in the HPI and as above.  12 point ROS was performed.  Physical Exam:  General: Alert and oriented.  No apparent distress.  Vascular:  Left foot:Dorsalis Pedis:  present Posterior Tibial:  present  Right foot: Dorsalis Pedis:  present Posterior Tibial:  present  Neuro:absent gross and protective sensation  Derm: Well-healed scar along the medial aspect of the first ray amputation site.  There is a small sinus tract in the plantar aspect of the skin flap.  There is no severe erythema.  The sinus tract has an area of plugged hyperkeratotic tissue.  Ortho/MS: Patient is status post first ray amputation left foot diffuse edema to the entire left foot is noted.   Assessment: Patient was residual osteomyelitis base of the first metatarsal with abscess along amputation site. Diabetes with neuropathy  Plan: Discussed with the patient need for reI&D of this area as there is some residual infection based on MRI results and concern for possible osteomyelitis at the residual base of the first metatarsal.  I discussed I&D procedure with debridement of all infection and removal of bone as needed.  Discussed the risk benefits alternatives and complications associated with surgery at  this time and consent has been given.  Dr. Excell Seltzer plans to perform surgery tomorrow afternoon.  Orders will be placed today.    Irean Hong, DPM Cell [phone]   07/08/2023 12:59 PM

## 2023-07-08 NOTE — Consult Note (Addendum)
NAME: Emma Thomas  DOB: [birth year]  MRN: [id number]  Date/Time: 07/08/2023 7:34 PM  REQUESTING PROVIDER: Dr.PAtel Subjective:  REASON FOR CONSULT: left foot infection ? Emma Thomas is a 27 y.o. with a history of ESRD on dialysis, Type 1 DM, HTN  had left great toe amputation in Jan 2024, partial ray excision left met on 06/19/23 for osteo and abscess and culture was streptococcus mitis and fingoldia and pathology revealed chronic inactive osteomyelitis with margin clear of any osteo presented to the ED on 8/21 with acute onset sob and fever for a few days- pt had missed dialysis on Sunday ( she does it at home) and when EMS was called yesterday she was getting dilaysis then. She was c/o of left foot hurting for a few days In the ED vitals were  07/07/23 11:53  BP 169/92 (H)  Temp 99.6 F (37.6 C)  Pulse Rate 120 !  SpO2 100 %    Labs  Latest Reference Range & Units 07/07/23 12:10  WBC 4.0 - 10.5 K/uL 3.7 (L)  Hemoglobin 12.0 - 15.0 g/dL 8.2 (L)  HCT 56.2 - 13.0 % 25.2 (L)  Platelets 150 - 400 K/uL 304   Blood culture sent MRI of the left foot showed recurrent 4.5X2.9X2.5 cm abscess in the first met amputation surgical bed I am asked to see the patient for the same Pt says she has pain left foot- no discharge  Past Medical History:  Diagnosis Date   Anemia    Diabetes mellitus without complication (HCC)    Type 1 DM   Essential hypertension    Headache    Hypertension 03/04/2013   Neurologic disorder    Both feet   Neuromuscular disorder (HCC)    Recurrent UTI    Renal disorder     Past Surgical History:  Procedure Laterality Date   abscess removal     excision of bartholin cyst   AMPUTATION Left 11/23/2022   Procedure: LEFT GREAT TOE PARTIAL RAY AMPUTATION;  Surgeon: Linus Galas, DPM;  Location: [facility];  Service: Podiatry;  Laterality: Left;   CAPD INSERTION N/A 06/09/2022   Procedure: LAPAROSCOPIC INSERTION CONTINUOUS AMBULATORY PERITONEAL DIALYSIS  (CAPD)  CATHETER, PD rep to be present;  Surgeon: Henrene Dodge, MD;  Location: [facility];  Service: General;  Laterality: N/A;   CAPD INSERTION N/A 12/15/2022   Procedure: LAPAROSCOPIC INSERTION CONTINUOUS AMBULATORY PERITONEAL DIALYSIS  (CAPD) CATHETER;  Surgeon: Henrene Dodge, MD;  Location: [facility];  Service: General;  Laterality: N/A;   CAPD REMOVAL N/A 12/15/2022   Procedure: LAPAROSCOPIC REMOVAL CONTINUOUS AMBULATORY PERITONEAL DIALYSIS  (CAPD) CATHETER, removal of old catheter;  Surgeon: Henrene Dodge, MD;  Location: [facility];  Service: General;  Laterality: N/A;   CAPD REMOVAL N/A 01/08/2023   Procedure: LAPAROSCOPIC REMOVAL CONTINUOUS AMBULATORY PERITONEAL DIALYSIS  (CAPD) CATHETER;  Surgeon: Henrene Dodge, MD;  Location: [facility];  Service: General;  Laterality: N/A;   CAPD REVISION N/A 07/16/2022   Procedure: LAPAROSCOPIC REVISION CONTINUOUS AMBULATORY PERITONEAL DIALYSIS  (CAPD) CATHETER;  Surgeon: Henrene Dodge, MD;  Location: [facility];  Service: General;  Laterality: N/A;   CAPD REVISION N/A 11/24/2022   Procedure: LAPAROSCOPIC REVISION CONTINUOUS AMBULATORY PERITONEAL DIALYSIS  (CAPD) CATHETER;  Surgeon: Henrene Dodge, MD;  Location: [facility];  Service: General;  Laterality: N/A;   DIALYSIS/PERMA CATHETER INSERTION N/A 04/24/2022   Procedure: DIALYSIS/PERMA CATHETER INSERTION;  Surgeon: Renford Dills, MD;  Location: [facility];  Service: Cardiovascular;  Laterality: N/A;  DIALYSIS/PERMA CATHETER REMOVAL N/A 06/02/2022   Procedure: DIALYSIS/PERMA CATHETER REMOVAL;  Surgeon: Renford Dills, MD;  Location: [facility];  Service: Cardiovascular;  Laterality: N/A;   DIALYSIS/PERMA CATHETER REMOVAL N/A 12/28/2022   Procedure: DIALYSIS/PERMA CATHETER REMOVAL;  Surgeon: Annice Needy, MD;  Location: [facility];  Service: Cardiovascular;  Laterality: N/A;   INSERTION OF DIALYSIS CATHETER Right    ARM   IR FLUORO GUIDE CV LINE RIGHT  11/27/2022   IR US GUIDE VASC  ACCESS RIGHT  11/27/2022   IRRIGATION AND DEBRIDEMENT FOOT Left 06/19/2023   Procedure: IRRIGATION AND DEBRIDEMENT FOOT PARTIAL 1ST RAY AMPUTATION;  Surgeon: Rosetta Posner, DPM;  Location: [facility];  Service: Orthopedics/Podiatry;  Laterality: Left;   Nexplanon  01/2011   UMBILICAL HERNIA REPAIR N/A 06/09/2022   Procedure: HERNIA REPAIR UMBILICAL ADULT;  Surgeon: Henrene Dodge, MD;  Location: [facility];  Service: General;  Laterality: N/A;    Social History   Socioeconomic History   Marital status: Married    Spouse name: Emma Thomas   Number of children: 1   Years of education: Not on file   Highest education level: Not on file  Occupational History   Not on file  Tobacco Use   Smoking status: Never    Passive exposure: Past   Smokeless tobacco: Never  Vaping Use   Vaping status: Never Used  Substance and Sexual Activity   Alcohol use: Not Currently    Alcohol/week: 2.0 standard drinks of alcohol    Types: 2 Shots of liquor per week   Drug use: No   Sexual activity: Yes    Birth control/protection: None  Other Topics Concern   Not on file  Social History Narrative   Not on file   Social Determinants of Health   Financial Resource Strain: Low Risk  (05/31/2023)   Received from [facility]   Overall Financial Resource Strain (CARDIA)    Difficulty of Paying Living Expenses: Not hard at all  Food Insecurity: No Food Insecurity (07/07/2023)   Hunger Vital Sign    Worried About Running Out of Food in the Last Year: Never true    Ran Out of Food in the Last Year: Never true  Transportation Needs: No Transportation Needs (07/07/2023)   PRAPARE - Administrator, Civil Service (Medical): No    Lack of Transportation (Non-Medical): No  Physical Activity: Not on file  Stress: Not on file  Social Connections: Not on file  Intimate Partner Violence: Not At Risk (07/07/2023)   Humiliation, Afraid, Rape, and Kick questionnaire    Fear of Current or Ex-Partner: No     Emotionally Abused: No    Physically Abused: No    Sexually Abused: No    Family History  Problem Relation Age of Onset   Breast cancer Mother 67   Lung cancer Maternal Grandmother    Diabetes type II Paternal Grandmother    No Known Allergies I? Current Facility-Administered Medications  Medication Dose Route Frequency Provider Last Rate Last Admin   0.9 %  sodium chloride infusion (Manually program via Guardrails IV Fluids)   Intravenous Once Enedina Finner, MD       acetaminophen (TYLENOL) tablet 650 mg  650 mg Oral Q6H PRN Jaynie Bream, RPH       Or   acetaminophen (TYLENOL) suppository 650 mg  650 mg Rectal Q6H PRN Cordella Register A, RPH       amLODipine (NORVASC) tablet 10  mg  10 mg Oral Daily Cox, Amy N, DO   10 mg at 07/08/23 1702   atorvastatin (LIPITOR) tablet 80 mg  80 mg Oral Daily Cox, Amy N, DO   80 mg at 07/08/23 1702   bumetanide (BUMEX) tablet 1 mg  1 mg Oral BID Cox, Amy N, DO   1 mg at 07/08/23 1801   ceFEPIme (MAXIPIME) 1 g in sodium chloride 0.9 % 100 mL IVPB  1 g Intravenous Q24H Cordella Register A, RPH 200 mL/hr at 07/08/23 1800 1 g at 07/08/23 1800   Chlorhexidine Gluconate Cloth 2 % PADS 6 each  6 each Topical Q0600 Wendee Beavers, NP   6 each at 07/08/23 0628   dextrose 5 % in lactated ringers infusion   Intravenous Continuous Enedina Finner, MD   Held at 07/08/23 1641   dextrose 50 % solution 0-50 mL  0-50 mL Intravenous PRN Enedina Finner, MD       furosemide (LASIX) tablet 80 mg  80 mg Oral Daily Cox, Amy N, DO   80 mg at 07/08/23 1702   heparin injection 5,000 Units  5,000 Units Subcutaneous Q8H Enedina Finner, MD       HYDROmorphone (DILAUDID) injection 1 mg  1 mg Intravenous Q4H PRN Enedina Finner, MD   1 mg at 07/08/23 1651   insulin regular, human (MYXREDLIN) 100 units/ 100 mL infusion   Intravenous Continuous Enedina Finner, MD 6.5 mL/hr at 07/08/23 1741 6.5 Units/hr at 07/08/23 1741   lactated ringers bolus 1,490 mL  20 mL/kg Intravenous Once Enedina Finner,  MD   Held at 07/08/23 1640   lactated ringers infusion   Intravenous Continuous Enedina Finner, MD 75 mL/hr at 07/08/23 1741 Infusion Verify at 07/08/23 1741   losartan (COZAAR) tablet 100 mg  100 mg Oral Daily Cox, Amy N, DO   100 mg at 07/08/23 1703   montelukast (SINGULAIR) tablet 10 mg  10 mg Oral QHS Cox, Amy N, DO   10 mg at 07/07/23 2333   multivitamin (RENA-VIT) tablet 1 tablet  1 tablet Oral QHS Cox, Amy N, DO   1 tablet at 07/07/23 2333   Norethin-Eth Estradiol-Fe [facility] FE) tablet 1 tablet  1 tablet Oral Daily Enedina Finner, MD       ondansetron [facility]) tablet 4 mg  4 mg Oral Q6H PRN Cox, Amy N, DO       Or   ondansetron (ZOFRAN) injection 4 mg  4 mg Intravenous Q6H PRN Cox, Amy N, DO   4 mg at 07/08/23 3244   senna-docusate (Senokot-S) tablet 1 tablet  1 tablet Oral QHS PRN Cox, Amy N, DO       sevelamer carbonate (RENVELA) tablet 1,600 mg  1,600 mg Oral TID WC Belue, Lendon Collar, RPH       And   sevelamer carbonate (RENVELA) tablet 800 mg  800 mg Oral PRN Otelia Sergeant, RPH       SUMAtriptan (IMITREX) tablet 50 mg  50 mg Oral Daily PRN Cox, Amy N, DO         Abtx:  Anti-infectives (From admission, onward)    Start     Dose/Rate Route Frequency Ordered Stop   07/08/23 1530  ceFEPIme (MAXIPIME) 1 g in sodium chloride 0.9 % 100 mL IVPB        1 g 200 mL/hr over 30 Minutes Intravenous Every 24 hours 07/08/23 0951     07/08/23 1400  vancomycin (VANCOREADY) IVPB 750 mg/150 mL  750 mg 150 mL/hr over 60 Minutes Intravenous  Once 07/08/23 0926 07/08/23 1641   07/08/23 1200  ceFEPIme (MAXIPIME) 1 g in sodium chloride 0.9 % 100 mL IVPB  Status:  Discontinued        1 g 200 mL/hr over 30 Minutes Intravenous Every 24 hours 07/07/23 1657 07/08/23 0951   07/07/23 1215  vancomycin (VANCOREADY) IVPB 1500 mg/300 mL        1,500 mg 150 mL/hr over 120 Minutes Intravenous  Once 07/07/23 1210 07/07/23 1619   07/07/23 1200  ceFEPIme (MAXIPIME) 2 g in sodium chloride 0.9 % 100 mL IVPB         2 g 200 mL/hr over 30 Minutes Intravenous  Once 07/07/23 1156 07/07/23 1312   07/07/23 1200  vancomycin (VANCOCIN) IVPB 1000 mg/200 mL premix  Status:  Discontinued        1,000 mg 200 mL/hr over 60 Minutes Intravenous  Once 07/07/23 1156 07/07/23 1210       REVIEW OF SYSTEMS:  Const:  fever, , negative weight loss Eyes: negative diplopia or visual changes, negative eye pain ENT: negative coryza, negative sore throat Resp: negative cough, hemoptysis, +dyspnea Cards: negative for chest pain, palpitations, lower extremity edema GU: negative for frequency, dysuria and hematuria GI: Negative for abdominal pain, diarrhea, bleeding, constipation Skin: negative for rash and pruritus Heme: negative for easy bruising and gum/nose bleeding MS:  weakness Neurolo:negative for headaches, dizziness, vertigo, memory problems  Psych: negative for feelings of anxiety, depression  Endocrine:  diabetes Allergy/Immunology- negative for any medication or food allergies ? Pertinent Positives include : Objective:  VITALS:  BP (!) 160/76   Pulse 99   Temp 99.9 F (37.7 C) (Oral)   Resp 20   Ht 5\' 6"  (1.676 m)   Wt 72.9 kg   LMP 06/14/2023   SpO2 99%   BMI 25.94 kg/m  LDA Rt arm graft PHYSICAL EXAM:  General: Alert, cooperative, no distress, appears stated age.  Head: Normocephalic, without obvious abnormality, atraumatic. Eyes: Conjunctivae clear, anicteric sclerae. Pupils are equal ENT Nares normal. No drainage or sinus tenderness. Lips, mucosa, and tongue normal. No Thrush Neck: Supple, symmetrical, no adenopathy, thyroid: non tender no carotid bruit and no JVD. Lungs: Clear to auscultation bilaterally. No Wheezing or Rhonchi. No rales. Heart: Regular rate and rhythm, no murmur, rub or gallop. Abdomen: Soft, non-tender,not distended. Bowel sounds normal. No masses Extremities: left toe- surgical site healed well- there is a small callus on the plantar surface Skin: No rashes or  lesions. Or bruising Lymph: Cervical, supraclavicular normal. Neurologic: Grossly non-focal Pertinent Labs Lab Results CBC    Component Value Date/Time   WBC 22.3 (H) 07/08/2023 0622   RBC 2.15 (L) 07/08/2023 0622   HGB 6.5 (L) 07/08/2023 0622   HGB 8.7 (L) 10/25/2019 1022   HCT 20.2 (L) 07/08/2023 0622   HCT 26.0 (L) 10/25/2019 1022   PLT 247 07/08/2023 0622   PLT 358 10/25/2019 1022   MCV 94.0 07/08/2023 0622   MCV 89 10/25/2019 1022   MCH 30.2 07/08/2023 0622   MCHC 32.2 07/08/2023 0622   RDW 12.4 07/08/2023 0622   RDW 11.8 10/25/2019 1022   LYMPHSABS 0.4 (L) 07/07/2023 1210   MONOABS 0.0 (L) 07/07/2023 1210   EOSABS 0.0 07/07/2023 1210   BASOSABS 0.0 07/07/2023 1210       Latest Ref Rng & Units 07/08/2023    4:32 PM 07/08/2023   10:48 AM 07/08/2023    6:22 AM  CMP  Glucose 70 - 99 mg/dL 595  638  756   BUN 6 - 20 mg/dL 37  34  69   Creatinine 0.44 - 1.00 mg/dL 4.33  2.95  18.84   Sodium 135 - 145 mmol/L 132  132  132   Potassium 3.5 - 5.1 mmol/L 4.3  3.9  7.3   Chloride 98 - 111 mmol/L 92  92  102   CO2 22 - 32 mmol/L 14  17  7    Calcium 8.9 - 10.3 mg/dL 7.3  7.6  6.5       Microbiology: Recent Results (from the past 240 hour(s))  Blood Culture (routine x 2)     Status: None (Preliminary result)   Collection Time: 07/07/23 12:10 PM   Specimen: BLOOD  Result Value Ref Range Status   Specimen Description BLOOD BLOOD LEFT ARM  Final   Special Requests   Final    BOTTLES DRAWN AEROBIC AND ANAEROBIC Blood Culture adequate volume   Culture   Final    NO GROWTH < 24 HOURS Performed at [facility], [address], [place], [place] [address]    Report Status PENDING  Incomplete  SARS Coronavirus 2 by RT PCR (hospital order, performed in [facility] hospital lab) *cepheid single result test* Anterior Nasal Swab     Status: None   Collection Time: 07/07/23 12:10 PM   Specimen: Anterior Nasal Swab  Result Value Ref Range Status   SARS Coronavirus 2 by  RT PCR NEGATIVE NEGATIVE Final    Comment: (NOTE) SARS-CoV-2 target nucleic acids are NOT DETECTED.  The SARS-CoV-2 RNA is generally detectable in upper and lower respiratory specimens during the acute phase of infection. The lowest concentration of SARS-CoV-2 viral copies this assay can detect is 250 copies / mL. A negative result does not preclude SARS-CoV-2 infection and should not be used as the sole basis for treatment or other patient management decisions.  A negative result may occur with improper specimen collection / handling, submission of specimen other than nasopharyngeal swab, presence of viral mutation(s) within the areas targeted by this assay, and inadequate number of viral copies (<250 copies / mL). A negative result must be combined with clinical observations, patient history, and epidemiological information.  Fact Sheet for Patients:   RoadLapTop.co.za  Fact Sheet for Healthcare Providers: http://kim-miller.com/  This test is not yet approved or  cleared by the [place] FDA and has been authorized for detection and/or diagnosis of SARS-CoV-2 by FDA under an Emergency Use Authorization (EUA).  This EUA will remain in effect (meaning this test can be used) for the duration of the COVID-19 declaration under Section 564(b)(1) of the Act, 21 U.S.C. section 360bbb-3(b)(1), unless the authorization is terminated or revoked sooner.  Performed at [facility], [address] [place], [place] [address]   Culture, blood (Routine X 2) w Reflex to ID Panel     Status: None (Preliminary result)   Collection Time: 07/07/23 10:46 PM   Specimen: BLOOD  Result Value Ref Range Status   Specimen Description BLOOD  LAC  Final   Special Requests   Final    BOTTLES DRAWN AEROBIC AND ANAEROBIC Blood Culture adequate volume   Culture   Final    NO GROWTH < 12 HOURS Performed at [facility], [address], [place], [place] [address]    Report Status PENDING  Incomplete  MRSA Next Gen by PCR, Nasal     Status: None  Collection Time: 07/07/23 11:50 PM   Specimen: Nasal Mucosa; Nasal Swab  Result Value Ref Range Status   MRSA by PCR Next Gen NOT DETECTED NOT DETECTED Final    Comment: (NOTE) The GeneXpert MRSA Assay (FDA approved for NASAL specimens only), is one component of a comprehensive MRSA colonization surveillance program. It is not intended to diagnose MRSA infection nor to guide or monitor treatment for MRSA infections. Test performance is not FDA approved in patients less than 60 years old. Performed at [facility], [address] [place], [place] [address]     IMAGING RESULTS:  I have personally reviewed the films Xray no osteo MRI- recurrent abscess at the bed of the amputated met head ? Impression/Recommendation Diabetic foot infection--h/o Left foot first toe amputaiton Left first met bed - abscess Osteo Pt is on cefepime and vanco Surgery tomorrow  ESRD- now on dialysis Type 1 DM  Anemia of CKD  HTN  Discussed the management with the patient in detail? ? ? ___________________________________________________ Discussed with patient, requesting provider Note:  This document was prepared using Dragon voice recognition software and may include unintentional dictation errors.

## 2023-07-08 NOTE — Progress Notes (Signed)
[facility]  - [place] at [facility]   PATIENT NAME: Emma Thomas    MR#:  [id number]  DATE OF BIRTH:  [birth year]  SUBJECTIVE:  patient seen during dialysis. No family present at bedside. Patient is dialysis foreshortened due to access issues and machine alarming per RN. She could not get blood transfusion during dialysis. Patient is frustrated with repeat labs and sticking her for blood work. I did speak with her on the phone along with her husband cameo Yetta Barre and explained reason behind it. Patient tells me she would like to go to a different hospital. Explain her there is no indication for transfer and recommended her not to leave AMA.    VITALS:  Blood pressure (!) 150/86, pulse 100, temperature 98.6 F (37 C), temperature source Oral, resp. rate 18, height 5\' 6"  (1.676 m), weight 72.9 kg, last menstrual period 06/14/2023, SpO2 99%.  PHYSICAL EXAMINATION:   GENERAL:  27 y.o.-year-old patient with no acute distress. Chronically ill LUNGS: Normal breath sounds bilaterally, no wheezing CARDIOVASCULAR: S1, S2 normal. No murmur   ABDOMEN: Soft, nontender, nondistended. Bowel sounds present.  EXTREMITIES: No  edema b/l.   HD access + Left foot   NEUROLOGIC: nonfocal  patient is alert and awake  LABORATORY PANEL:  CBC Recent Labs  Lab 07/08/23 0622  WBC 22.3*  HGB 6.5*  HCT 20.2*  PLT 247    Chemistries  Recent Labs  Lab 07/07/23 1348 07/08/23 0622 07/08/23 1048  NA 128*   < > 132*  K 5.2*   < > 3.9  CL 96*   < > 92*  CO2 10*   < > 17*  GLUCOSE 307*   < > 221*  BUN 60*   < > 34*  CREATININE 10.45*   < > 5.26*  CALCIUM 7.1*   < > 7.6*  AST 22  --   --   ALT 14  --   --   ALKPHOS 65  --   --   BILITOT 1.5*  --   --    < > = values in this interval not displayed.   Cardiac Enzymes No results for input(s): "TROPONINI" in the last 168 hours. RADIOLOGY:  MR FOOT LEFT WO CONTRAST  Result Date: 07/08/2023 CLINICAL DATA:  Fever and left foot  pain. Recent first ray amputation. EXAM: MRI OF THE LEFT FOOT WITHOUT CONTRAST TECHNIQUE: Multiplanar, multisequence MR imaging of the left forefoot was performed. No intravenous contrast was administered. COMPARISON:  Left foot x-rays from same day. MRI left foot dated June 18, 2023. FINDINGS: Bones/Joint/Cartilage Prior first ray amputation with small residual first metatarsal base. New marrow edema within the residual base, as well as the distal medial cuneiform. 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 No fracture or dislocation. No joint effusion. Ligaments Second through fifth toe collateral ligaments are intact. Lisfranc ligament is intact. Muscles and Tendons Second through fifth flexor and extensor tendons are intact. No tenosynovitis. Increased T2 signal within the intrinsic muscles of the forefoot, nonspecific, but likely related to diabetic muscle changes. Soft tissue Recurrent 4.5 x 2.9 x 2.5 cm complex fluid collection in the recent first metatarsal amputation surgical bed (series 8, image 10; series 9, images 9-10). Unchanged sinus tract extending to the plantar surface. No soft tissue mass. IMPRESSION: 1. Prior first ray amputation with small residual first metatarsal base. New marrow edema within the residual base, as well as the distal medial cuneiform, concerning for osteomyelitis. 2. Recurrent 4.5 x  2.9 x 2.5 cm abscess in the recent first metatarsal amputation surgical bed. Unchanged sinus tract extending to the plantar surface. 3. 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 Electronically Signed   By: Obie Dredge M.D.   On: 07/08/2023 08:25   DG Chest Port 1 View  Result Date: 07/07/2023 CLINICAL DATA:  Questionable sepsis - evaluate for abnormality fever and chills. EXAM: PORTABLE CHEST 1 VIEW COMPARISON:  08/10/2022. FINDINGS: Bilateral lung fields are clear. Bilateral costophrenic angles are clear. Normal cardio-mediastinal silhouette. No acute  osseous abnormalities. The soft tissues are within normal limits. IMPRESSION: No active disease. Electronically Signed   By: Jules Schick M.D.   On: 07/07/2023 13:39   DG Foot 2 Views Left  Result Date: 07/07/2023 CLINICAL DATA:  L foot pain, fever.  Recent surgery-2 weeks back. EXAM: LEFT FOOT - 2 VIEW COMPARISON:  06/19/2023. FINDINGS: Redemonstration of amputation of great toe, near the proximal base of the first metatarsal. The resection margin is sharp. No focal bone erosions noted. There is interval disuse osteopenia. No evidence of focal soft tissue defect or air within the soft tissue at the surgical resection site/stump. 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 Otherwise, no acute fracture or dislocation. No aggressive osseous lesion. No significant degenerative changes of imaged joints. No focal soft tissue swelling. No radiopaque foreign bodies. IMPRESSION: 1. Redemonstration of great toe amputation. No radiographic evidence of osteomyelitis. Correlate clinically to determine the need for additional imaging with more sensitive modality such as MRI. 2. 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 Electronically Signed   By: Jules Schick M.D.   On: 07/07/2023 13:31    Assessment and Plan [name] is a 27 year old female with history of hypertension, hyperlipidemia, end-stage renal disease, insulin-dependent diabetes mellitus type 1, heart failure preserved ejection fraction, hypertension, anemia of chronic disease, vitamin D deficiency, history of diabetic foot infection, who presents to the emergency department for chief concerns of fever, body aches, and shortness of breath for several days.  Blood cultures x 2 have been ordered. Initial lactic acid 4.5.   DKA in type I diabetes with anion gap acidosis -- transfer patient to step down for IV insulin drip, metabolic panel Q4 -- IV fluids will  reduce the rate to void volume overload given patient being dialysis -- CO2 seven, Beta hydroxybutyric acid 5.9 -- A1c 8.0  Severe sepsis secondary to left foot abscess -- patient recently underwent partial first ray amputation by podiatry -- MRI shows abscess near left first metatarsal. -- Continue broad-spectrum antibiotic -- patient seen by podiatry Dr. Ether Griffins. Plans for incision and drainage and debridement tomorrow by Dr. Excell Seltzer -- blood culture negative -- ID consultation with Dr. Joylene Draft  Anemia of chronic disease end-stage renal disease on hemodialysis hyperkalemia -- patient's baseline hemoglobin stays around 8.0 -- hemoglobin drop down to 6.8. Discussed with patient earlier agreeable for one unit blood transfusion -- came in with potassium of 7.3--HD--3.9  Hyponatremia suspected due to elevated sugars -- hopefully will resolve once DKA improves  Hypertension -- continue present meds  Procedures: Family communication : spoke with cameo Jones husband Consults : podiatry, nephrology, ID CODE STATUS: full DVT Prophylaxis : heparin Level of care: Stepdown Status is: Inpatient Remains inpatient appropriate because: DKA, sepsis due to left foot infection    TOTAL CRITICAL TIME TAKING CARE OF THIS PATIENT: 45 minutes.  >50% time spent on counselling and coordination of care  Note: This  dictation was prepared with Dragon dictation along with smaller phrase technology. Any transcriptional errors that result from this process are unintentional.  Enedina Finner M.D    [facility]   CC: Primary care physician; [name]

## 2023-07-08 NOTE — Progress Notes (Signed)
Hemodialysis Note  Received patient in bed to unit. Alert and oriented. Informed consent signed and in chart.   Treatment initiated: 0817 Treatment completed: 1236  Patient tolerated treatment well. Transported back to the room alert, without acute distress. Report given to patient's RN.  Access used: RUA AVF  Access issues: High venous pressures, needle readjusted, blood flow rate reduced, despite interventions patient requested to terminate treatment.   Total UF removed:1660.3 ml Medications given: Vancomycin Post HD VS: Stable  Post HD weight:  72.9 kg  Bartolo Darter, RN [facility]

## 2023-07-08 NOTE — Progress Notes (Signed)
Pt remains anemic with latest hemoglobin at 6.8. Discussed with NP on call regarding pt's limited IV access and order from previous shift to transfuse one unit PRBC. NP on call states that it is ok to hold transfusion until pt is transitioned from insulin gtt. Will continue to monitor and contact NP on call if pt condition changes.

## 2023-07-08 NOTE — Progress Notes (Signed)
[facility]  ROUNDING NOTE   Subjective:   Emma Thomas is a 27 y.o female with past medical history of diabetes, anemia, and diabetes type 1, and end stage renal disease on peritoneal dialysis. Patient presents to the ED with fever and body aches. She has been admitted for Left foot pain [M79.672] Sepsis, due to unspecified organism, unspecified whether acute organ dysfunction present [facility]) [A41.9]  Patient is known to our practice and completes home hemodialysis supervised by Dr. Cherylann Ratel.  Patient reports no recent missed treatments.  Patient states she has been experiencing bodyaches with intermittent fevers over the past few days.  Denies nausea or vomiting.  Reports some shortness of breath.  Patient was recently admitted on 06/19/2023 and underwent a left foot partial first ray amputation with I&D.  Patient states she received antibiotics at that time.  Patient says now that wound has healed, she feels like it has become firm.  Labs on ED arrival concerning for sodium 128, potassium 5.2, serum bicarb 10, BUN 60, creatinine 10.45 with GFR 5, calcium 7.1, and anion gap 22.  Hemoglobin 8.2.  Left foot MRI concerning for osteomyelitis and abscess with infection.  We will manage dialysis needs during this admission.    Objective:  Vital signs in last 24 hours:  Temp:  [98 F (36.7 C)-99.2 F (37.3 C)] 98.9 F (37.2 C) (08/22 1208) Pulse Rate:  [90-125] 97 (08/22 1208) Resp:  [0-35] 18 (08/22 1208) BP: (115-166)/(66-89) 157/81 (08/22 1208) SpO2:  [95 %-100 %] 98 % (08/22 1208) Weight:  [69.5 kg-74.5 kg] 74.5 kg (08/22 0805)  Weight change:  Filed Weights   07/07/23 1623 07/08/23 0805  Weight: 69.5 kg 74.5 kg    Intake/Output: I/O last 3 completed shifts: In: 2260.5 [P.O.:360; I.V.:0.5; IV Piggyback:1900] Out: -    Intake/Output this shift:  No intake/output data recorded.  Physical Exam: General: NAD  Head: Normocephalic, atraumatic. Moist oral mucosal  membranes  Eyes: Anicteric  Lungs:  Clear to auscultation  Heart: Regular rate and rhythm  Abdomen:  Soft, nontender  Extremities:  trace peripheral edema.  Neurologic: Nonfocal, moving all four extremities  Skin: No lesions  Access: Rt AVF    Basic Metabolic Panel: Recent Labs  Lab 07/07/23 1348 07/08/23 0622 07/08/23 1048  NA 128* 132* 132*  K 5.2* 7.3* 3.9  CL 96* 102 92*  CO2 10* 7* 17*  GLUCOSE 307* 370* 221*  BUN 60* 69* 34*  CREATININE 10.45* 10.86* 5.26*  CALCIUM 7.1* 6.5* 7.6*    Liver Function Tests: Recent Labs  Lab 07/07/23 1348  AST 22  ALT 14  ALKPHOS 65  BILITOT 1.5*  PROT 7.1  ALBUMIN 2.7*   No results for input(s): "LIPASE", "AMYLASE" in the last 168 hours. No results for input(s): "AMMONIA" in the last 168 hours.  CBC: Recent Labs  Lab 07/07/23 1210 07/08/23 0622  WBC 3.7* 22.3*  NEUTROABS 3.3  --   HGB 8.2* 6.5*  HCT 25.2* 20.2*  MCV 91.6 94.0  PLT 304 247    Cardiac Enzymes: No results for input(s): "CKTOTAL", "CKMB", "CKMBINDEX", "TROPONINI" in the last 168 hours.  BNP: Invalid input(s): "POCBNP"  CBG: Recent Labs  Lab 07/07/23 2103  GLUCAP 351*    Microbiology: Results for orders placed or performed during the hospital encounter of 07/07/23  Blood Culture (routine x 2)     Status: None (Preliminary result)   Collection Time: 07/07/23 12:10 PM   Specimen: BLOOD  Result Value Ref Range  Status   Specimen Description BLOOD BLOOD LEFT ARM  Final   Special Requests   Final    BOTTLES DRAWN AEROBIC AND ANAEROBIC Blood Culture adequate volume   Culture   Final    NO GROWTH < 24 HOURS Performed at [facility], [address], [place], [place] [address]    Report Status PENDING  Incomplete  SARS Coronavirus 2 by RT PCR (hospital order, performed in [facility] hospital lab) *cepheid single result test* Anterior Nasal Swab     Status: None   Collection Time: 07/07/23 12:10 PM   Specimen: Anterior Nasal Swab   Result Value Ref Range Status   SARS Coronavirus 2 by RT PCR NEGATIVE NEGATIVE Final    Comment: (NOTE) SARS-CoV-2 target nucleic acids are NOT DETECTED.  The SARS-CoV-2 RNA is generally detectable in upper and lower respiratory specimens during the acute phase of infection. The lowest concentration of SARS-CoV-2 viral copies this assay can detect is 250 copies / mL. A negative result does not preclude SARS-CoV-2 infection and should not be used as the sole basis for treatment or other patient management decisions.  A negative result may occur with improper specimen collection / handling, submission of specimen other than nasopharyngeal swab, presence of viral mutation(s) within the areas targeted by this assay, and inadequate number of viral copies (<250 copies / mL). A negative result must be combined with clinical observations, patient history, and epidemiological information.  Fact Sheet for Patients:   RoadLapTop.co.za  Fact Sheet for Healthcare Providers: http://kim-miller.com/  This test is not yet approved or  cleared by the [place] FDA and has been authorized for detection and/or diagnosis of SARS-CoV-2 by FDA under an Emergency Use Authorization (EUA).  This EUA will remain in effect (meaning this test can be used) for the duration of the COVID-19 declaration under Section 564(b)(1) of the Act, 21 U.S.C. section 360bbb-3(b)(1), unless the authorization is terminated or revoked sooner.  Performed at [facility], [address] [place], [place] [address]   Culture, blood (Routine X 2) w Reflex to ID Panel     Status: None (Preliminary result)   Collection Time: 07/07/23 10:46 PM   Specimen: BLOOD  Result Value Ref Range Status   Specimen Description BLOOD  LAC  Final   Special Requests   Final    BOTTLES DRAWN AEROBIC AND ANAEROBIC Blood Culture adequate volume   Culture   Final    NO GROWTH < 12  HOURS Performed at [facility], [address], [place], [place] [address]    Report Status PENDING  Incomplete  MRSA Next Gen by PCR, Nasal     Status: None   Collection Time: 07/07/23 11:50 PM   Specimen: Nasal Mucosa; Nasal Swab  Result Value Ref Range Status   MRSA by PCR Next Gen NOT DETECTED NOT DETECTED Final    Comment: (NOTE) The GeneXpert MRSA Assay (FDA approved for NASAL specimens only), is one component of a comprehensive MRSA colonization surveillance program. It is not intended to diagnose MRSA infection nor to guide or monitor treatment for MRSA infections. Test performance is not FDA approved in patients less than 11 years old. Performed at [facility], [address] [place], [place] [address]     Coagulation Studies: Recent Labs    07/07/23 1348  LABPROT 16.4*  INR 1.3*    Urinalysis: Recent Labs    07/07/23 1547  COLORURINE YELLOW*  LABSPEC 1.011  PHURINE 6.0  GLUCOSEU >=  500*  HGBUR SMALL*  BILIRUBINUR NEGATIVE  KETONESUR 20*  PROTEINUR >=300*  NITRITE NEGATIVE  LEUKOCYTESUR NEGATIVE      Imaging: MR FOOT LEFT WO CONTRAST  Result Date: 07/08/2023 CLINICAL DATA:  Fever and left foot pain. Recent first ray amputation. EXAM: MRI OF THE LEFT FOOT WITHOUT CONTRAST TECHNIQUE: Multiplanar, multisequence MR imaging of the left forefoot was performed. No intravenous contrast was administered. COMPARISON:  Left foot x-rays from same day. MRI left foot dated June 18, 2023. FINDINGS: Bones/Joint/Cartilage Prior first ray amputation with small residual first metatarsal base. New marrow edema within the residual base, as well as the distal medial cuneiform. 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 No fracture or dislocation. No joint effusion. Ligaments Second through fifth toe collateral ligaments are intact. Lisfranc ligament is intact. Muscles and Tendons Second through fifth flexor and extensor tendons are intact. No  tenosynovitis. Increased T2 signal within the intrinsic muscles of the forefoot, nonspecific, but likely related to diabetic muscle changes. Soft tissue Recurrent 4.5 x 2.9 x 2.5 cm complex fluid collection in the recent first metatarsal amputation surgical bed (series 8, image 10; series 9, images 9-10). Unchanged sinus tract extending to the plantar surface. No soft tissue mass. IMPRESSION: 1. Prior first ray amputation with small residual first metatarsal base. New marrow edema within the residual base, as well as the distal medial cuneiform, concerning for osteomyelitis. 2. Recurrent 4.5 x 2.9 x 2.5 cm abscess in the recent first metatarsal amputation surgical bed. Unchanged sinus tract extending to the plantar surface. 3. 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 Electronically Signed   By: Obie Dredge M.D.   On: 07/08/2023 08:25   DG Chest Port 1 View  Result Date: 07/07/2023 CLINICAL DATA:  Questionable sepsis - evaluate for abnormality fever and chills. EXAM: PORTABLE CHEST 1 VIEW COMPARISON:  08/10/2022. FINDINGS: Bilateral lung fields are clear. Bilateral costophrenic angles are clear. Normal cardio-mediastinal silhouette. No acute osseous abnormalities. The soft tissues are within normal limits. IMPRESSION: No active disease. Electronically Signed   By: Jules Schick M.D.   On: 07/07/2023 13:39   DG Foot 2 Views Left  Result Date: 07/07/2023 CLINICAL DATA:  L foot pain, fever.  Recent surgery-2 weeks back. EXAM: LEFT FOOT - 2 VIEW COMPARISON:  06/19/2023. FINDINGS: Redemonstration of amputation of great toe, near the proximal base of the first metatarsal. The resection margin is sharp. No focal bone erosions noted. There is interval disuse osteopenia. No evidence of focal soft tissue defect or air within the soft tissue at the surgical resection site/stump. 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 Otherwise, no  acute fracture or dislocation. No aggressive osseous lesion. No significant degenerative changes of imaged joints. No focal soft tissue swelling. No radiopaque foreign bodies. IMPRESSION: 1. Redemonstration of great toe amputation. No radiographic evidence of osteomyelitis. Correlate clinically to determine the need for additional imaging with more sensitive modality such as MRI. 2. 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 Electronically Signed   By: Jules Schick M.D.   On: 07/07/2023 13:31     Medications:    ceFEPime (MAXIPIME) IV     dextrose 5% lactated ringers     insulin     lactated ringers     lactated ringers      sodium chloride   Intravenous Once   amLODipine  10 mg Oral Daily   atorvastatin  80 mg Oral Daily  bumetanide  1 mg Oral BID   Chlorhexidine Gluconate Cloth  6 each Topical Q0600   furosemide  80 mg Oral Daily   insulin aspart  0-5 Units Subcutaneous QHS   insulin aspart  0-6 Units Subcutaneous TID WC   losartan  100 mg Oral Daily   montelukast  10 mg Oral QHS   multivitamin  1 tablet Oral QHS   sevelamer carbonate  1,600 mg Oral TID WC   acetaminophen **OR** acetaminophen, alteplase, dextrose, heparin, hydrALAZINE, HYDROmorphone (DILAUDID) injection, LORazepam, ondansetron **OR** ondansetron (ZOFRAN) IV, senna-docusate, sevelamer carbonate **AND** sevelamer carbonate, SUMAtriptan  Assessment/ Plan:  [name] is a 27 y.o.  female with past medical history of diabetes, anemia, and diabetes type 1, and end stage renal disease on peritoneal dialysis. Patient presents to the ED with fever and body aches. She has been admitted for Left foot pain [M79.672] Sepsis, due to unspecified organism, unspecified whether acute organ dysfunction present (HCC) [A41.9]   End stage renal disease with hyperkalemia on home hemodialysis. Will complete a treatment today, UF goal 1.5L as tolerated. Potassium 7.3 this morning. Will dialyze on 1K  bath. Patient terminated treatment with 20 min remaining. Will recheck potassium later today.  Next treatment scheduled for Saturday.   2. Anemia of chronic kidney disease Lab Results  Component Value Date   HGB 6.5 (L) 07/08/2023    Hgb below desired range. Primary team has ordered a blood transfusion. Unable to transfuse during dialysis due to access issues.   3. Secondary Hyperparathyroidism: with outpatient labs: None available   Lab Results  Component Value Date   CALCIUM 7.6 (L) 07/08/2023   CAION 0.97 (L) 01/08/2023   PHOS 8.3 (H) 06/21/2023    Patient prescribed renvela with meals outpatient. Will continue to monitor bone minerals.   4. Diabetes mellitus type II with chronic kidney disease/renal manifestations: insulin dependent. Home regimen includes humalog. Most recent hemoglobin A1c is 8.5 on 06/18/23.   5. Sepsis secondary to left foot abscess. 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 MRI left foot concerning for osteomyelitis with infected abscess. Podiatry planning I&D tomorrow.    LOS: 1   8/22/20241:25 PM

## 2023-07-09 ENCOUNTER — Inpatient Hospital Stay: Payer: 59

## 2023-07-09 ENCOUNTER — Other Ambulatory Visit: Payer: Self-pay

## 2023-07-09 ENCOUNTER — Encounter
Admission: EM | Discharge status: Against Medical Advice | Payer: Self-pay | Source: Home / Self Care | Attending: Internal Medicine

## 2023-07-09 ENCOUNTER — Inpatient Hospital Stay: Payer: 59 | Admitting: Anesthesiology

## 2023-07-09 DIAGNOSIS — Z79899 Other long term (current) drug therapy: Secondary | ICD-10-CM

## 2023-07-09 DIAGNOSIS — I132 Hypertensive heart and chronic kidney disease with heart failure and with stage 5 chronic kidney disease, or end stage renal disease: Secondary | ICD-10-CM | POA: Diagnosis present

## 2023-07-09 DIAGNOSIS — Z87440 Personal history of urinary (tract) infections: Secondary | ICD-10-CM

## 2023-07-09 DIAGNOSIS — E785 Hyperlipidemia, unspecified: Secondary | ICD-10-CM | POA: Diagnosis present

## 2023-07-09 DIAGNOSIS — I160 Hypertensive urgency: Secondary | ICD-10-CM | POA: Diagnosis present

## 2023-07-09 DIAGNOSIS — Z794 Long term (current) use of insulin: Secondary | ICD-10-CM

## 2023-07-09 DIAGNOSIS — E1010 Type 1 diabetes mellitus with ketoacidosis without coma: Secondary | ICD-10-CM | POA: Diagnosis not present

## 2023-07-09 DIAGNOSIS — N186 End stage renal disease: Secondary | ICD-10-CM | POA: Diagnosis present

## 2023-07-09 DIAGNOSIS — Z793 Long term (current) use of hormonal contraceptives: Secondary | ICD-10-CM

## 2023-07-09 DIAGNOSIS — I5032 Chronic diastolic (congestive) heart failure: Secondary | ICD-10-CM | POA: Diagnosis present

## 2023-07-09 DIAGNOSIS — E878 Other disorders of electrolyte and fluid balance, not elsewhere classified: Secondary | ICD-10-CM | POA: Diagnosis present

## 2023-07-09 DIAGNOSIS — E1069 Type 1 diabetes mellitus with other specified complication: Secondary | ICD-10-CM | POA: Diagnosis present

## 2023-07-09 DIAGNOSIS — E10628 Type 1 diabetes mellitus with other skin complications: Secondary | ICD-10-CM | POA: Diagnosis present

## 2023-07-09 DIAGNOSIS — Z992 Dependence on renal dialysis: Secondary | ICD-10-CM

## 2023-07-09 DIAGNOSIS — D72829 Elevated white blood cell count, unspecified: Secondary | ICD-10-CM | POA: Diagnosis present

## 2023-07-09 DIAGNOSIS — E871 Hypo-osmolality and hyponatremia: Secondary | ICD-10-CM | POA: Diagnosis present

## 2023-07-09 DIAGNOSIS — D631 Anemia in chronic kidney disease: Secondary | ICD-10-CM | POA: Diagnosis present

## 2023-07-09 DIAGNOSIS — E1022 Type 1 diabetes mellitus with diabetic chronic kidney disease: Secondary | ICD-10-CM | POA: Diagnosis present

## 2023-07-09 DIAGNOSIS — L02612 Cutaneous abscess of left foot: Secondary | ICD-10-CM | POA: Diagnosis present

## 2023-07-09 DIAGNOSIS — D638 Anemia in other chronic diseases classified elsewhere: Secondary | ICD-10-CM | POA: Diagnosis not present

## 2023-07-09 DIAGNOSIS — M79672 Pain in left foot: Secondary | ICD-10-CM | POA: Diagnosis not present

## 2023-07-09 DIAGNOSIS — E876 Hypokalemia: Secondary | ICD-10-CM | POA: Diagnosis present

## 2023-07-09 DIAGNOSIS — Z833 Family history of diabetes mellitus: Secondary | ICD-10-CM

## 2023-07-09 DIAGNOSIS — G43909 Migraine, unspecified, not intractable, without status migrainosus: Secondary | ICD-10-CM | POA: Diagnosis present

## 2023-07-09 DIAGNOSIS — M869 Osteomyelitis, unspecified: Secondary | ICD-10-CM | POA: Diagnosis present

## 2023-07-09 DIAGNOSIS — A419 Sepsis, unspecified organism: Secondary | ICD-10-CM | POA: Diagnosis not present

## 2023-07-09 HISTORY — PX: AMPUTATION: SHX166

## 2023-07-09 HISTORY — PX: IRRIGATION AND DEBRIDEMENT FOOT: SHX6602

## 2023-07-09 LAB — CBC WITH DIFFERENTIAL/PLATELET
Abs Immature Granulocytes: 0.05 10*3/uL (ref 0.00–0.07)
Basophils Absolute: 0.1 10*3/uL (ref 0.0–0.1)
Basophils Relative: 0 %
Eosinophils Absolute: 0.2 10*3/uL (ref 0.0–0.5)
Eosinophils Relative: 1 %
HCT: 23.2 % — ABNORMAL LOW (ref 36.0–46.0)
Hemoglobin: 7.9 g/dL — ABNORMAL LOW (ref 12.0–15.0)
Immature Granulocytes: 0 %
Lymphocytes Relative: 12 %
Lymphs Abs: 1.7 10*3/uL (ref 0.7–4.0)
MCH: 29.6 pg (ref 26.0–34.0)
MCHC: 34.1 g/dL (ref 30.0–36.0)
MCV: 86.9 fL (ref 80.0–100.0)
Monocytes Absolute: 1.1 10*3/uL — ABNORMAL HIGH (ref 0.1–1.0)
Monocytes Relative: 8 %
Neutro Abs: 11.2 10*3/uL — ABNORMAL HIGH (ref 1.7–7.7)
Neutrophils Relative %: 79 %
Platelets: 269 10*3/uL (ref 150–400)
RBC: 2.67 MIL/uL — ABNORMAL LOW (ref 3.87–5.11)
RDW: 12.6 % (ref 11.5–15.5)
WBC: 14.3 10*3/uL — ABNORMAL HIGH (ref 4.0–10.5)
nRBC: 0 % (ref 0.0–0.2)

## 2023-07-09 LAB — BASIC METABOLIC PANEL
Anion gap: 17 — ABNORMAL HIGH (ref 5–15)
Anion gap: 17 — ABNORMAL HIGH (ref 5–15)
Anion gap: 15 (ref 5–15)
Anion gap: 18 — ABNORMAL HIGH (ref 5–15)
BUN: 43 mg/dL — ABNORMAL HIGH (ref 6–20)
BUN: 44 mg/dL — ABNORMAL HIGH (ref 6–20)
BUN: 42 mg/dL — ABNORMAL HIGH (ref 6–20)
BUN: 43 mg/dL — ABNORMAL HIGH (ref 6–20)
CO2: 25 mmol/L (ref 22–32)
CO2: 22 mmol/L (ref 22–32)
CO2: 22 mmol/L (ref 22–32)
CO2: 21 mmol/L — ABNORMAL LOW (ref 22–32)
Calcium: 7.6 mg/dL — ABNORMAL LOW (ref 8.9–10.3)
Calcium: 7.1 mg/dL — ABNORMAL LOW (ref 8.9–10.3)
Calcium: 7.4 mg/dL — ABNORMAL LOW (ref 8.9–10.3)
Calcium: 7.6 mg/dL — ABNORMAL LOW (ref 8.9–10.3)
Chloride: 96 mmol/L — ABNORMAL LOW (ref 98–111)
Chloride: 95 mmol/L — ABNORMAL LOW (ref 98–111)
Chloride: 97 mmol/L — ABNORMAL LOW (ref 98–111)
Chloride: 94 mmol/L — ABNORMAL LOW (ref 98–111)
Creatinine, Ser: 6.57 mg/dL — ABNORMAL HIGH (ref 0.44–1.00)
Creatinine, Ser: 7.21 mg/dL — ABNORMAL HIGH (ref 0.44–1.00)
Creatinine, Ser: 7.17 mg/dL — ABNORMAL HIGH (ref 0.44–1.00)
Creatinine, Ser: 7.27 mg/dL — ABNORMAL HIGH (ref 0.44–1.00)
GFR, Estimated: 8 mL/min — ABNORMAL LOW (ref >60)
GFR, Estimated: 7 mL/min — ABNORMAL LOW (ref >60)
GFR, Estimated: 7 mL/min — ABNORMAL LOW (ref >60)
GFR, Estimated: 7 mL/min — ABNORMAL LOW (ref >60)
Glucose, Bld: 117 mg/dL — ABNORMAL HIGH (ref 70–99)
Glucose, Bld: 176 mg/dL — ABNORMAL HIGH (ref 70–99)
Glucose, Bld: 174 mg/dL — ABNORMAL HIGH (ref 70–99)
Glucose, Bld: 183 mg/dL — ABNORMAL HIGH (ref 70–99)
Potassium: 4.2 mmol/L (ref 3.5–5.1)
Potassium: 4.2 mmol/L (ref 3.5–5.1)
Potassium: 3.8 mmol/L (ref 3.5–5.1)
Potassium: 3.7 mmol/L (ref 3.5–5.1)
Sodium: 138 mmol/L (ref 135–145)
Sodium: 134 mmol/L — ABNORMAL LOW (ref 135–145)
Sodium: 134 mmol/L — ABNORMAL LOW (ref 135–145)
Sodium: 133 mmol/L — ABNORMAL LOW (ref 135–145)

## 2023-07-09 LAB — GLUCOSE, CAPILLARY
Glucose-Capillary: 123 mg/dL — ABNORMAL HIGH (ref 70–99)
Glucose-Capillary: 113 mg/dL — ABNORMAL HIGH (ref 70–99)
Glucose-Capillary: 107 mg/dL — ABNORMAL HIGH (ref 70–99)
Glucose-Capillary: 107 mg/dL — ABNORMAL HIGH (ref 70–99)
Glucose-Capillary: 103 mg/dL — ABNORMAL HIGH (ref 70–99)
Glucose-Capillary: 116 mg/dL — ABNORMAL HIGH (ref 70–99)
Glucose-Capillary: 116 mg/dL — ABNORMAL HIGH (ref 70–99)
Glucose-Capillary: 125 mg/dL — ABNORMAL HIGH (ref 70–99)
Glucose-Capillary: 131 mg/dL — ABNORMAL HIGH (ref 70–99)
Glucose-Capillary: 153 mg/dL — ABNORMAL HIGH (ref 70–99)
Glucose-Capillary: 176 mg/dL — ABNORMAL HIGH (ref 70–99)
Glucose-Capillary: 195 mg/dL — ABNORMAL HIGH (ref 70–99)
Glucose-Capillary: 168 mg/dL — ABNORMAL HIGH (ref 70–99)
Glucose-Capillary: 176 mg/dL — ABNORMAL HIGH (ref 70–99)
Glucose-Capillary: 151 mg/dL — ABNORMAL HIGH (ref 70–99)
Glucose-Capillary: 211 mg/dL — ABNORMAL HIGH (ref 70–99)
Glucose-Capillary: 166 mg/dL — ABNORMAL HIGH (ref 70–99)

## 2023-07-09 LAB — HEPATITIS B SURFACE ANTIBODY, QUANTITATIVE: Hep B S AB Quant (Post): 92.4 m[IU]/mL

## 2023-07-09 LAB — BETA-HYDROXYBUTYRIC ACID: Beta-Hydroxybutyric Acid: 0.45 mmol/L — ABNORMAL HIGH (ref 0.05–0.27)

## 2023-07-09 SURGERY — IRRIGATION AND DEBRIDEMENT FOOT
Anesthesia: Monitor Anesthesia Care | Site: Foot | Laterality: Left

## 2023-07-09 MED ORDER — LIDOCAINE-PRILOCAINE 2.5-2.5 % EX CREA
1.0000 | TOPICAL_CREAM | CUTANEOUS | Status: DC | PRN
  Filled 2023-07-09: qty 5

## 2023-07-09 MED ORDER — FENTANYL CITRATE (PF) 100 MCG/2ML IJ SOLN
25.0000 ug | INTRAMUSCULAR | Status: DC | PRN
  Administered 2023-07-09: 25 ug via INTRAVENOUS

## 2023-07-09 MED ORDER — ONDANSETRON HCL 4 MG/2ML IJ SOLN
INTRAMUSCULAR | Status: DC | PRN
  Administered 2023-07-09: 4 mg via INTRAVENOUS

## 2023-07-09 MED ORDER — MIDAZOLAM HCL 2 MG/2ML IJ SOLN
INTRAMUSCULAR | Status: AC
  Filled 2023-07-09: qty 2

## 2023-07-09 MED ORDER — LIDOCAINE HCL (PF) 1 % IJ SOLN
INTRAMUSCULAR | Status: AC
  Filled 2023-07-09: qty 30

## 2023-07-09 MED ORDER — OXYCODONE HCL 5 MG/5ML PO SOLN: 5.0000 mg | Freq: Once | ORAL | Status: DC | PRN

## 2023-07-09 MED ORDER — MIDAZOLAM HCL 2 MG/2ML IJ SOLN
INTRAMUSCULAR | Status: DC | PRN
  Administered 2023-07-09: 2 mg via INTRAVENOUS

## 2023-07-09 MED ORDER — FENTANYL CITRATE (PF) 100 MCG/2ML IJ SOLN
INTRAMUSCULAR | Status: DC | PRN
  Administered 2023-07-09: 50 ug via INTRAVENOUS
  Administered 2023-07-09 (×2): 25 ug via INTRAVENOUS

## 2023-07-09 MED ORDER — HEPARIN SODIUM (PORCINE) 1000 UNIT/ML DIALYSIS: 1000.0000 [IU] | INTRAMUSCULAR | Status: DC | PRN

## 2023-07-09 MED ORDER — 0.9 % SODIUM CHLORIDE (POUR BTL) OPTIME
TOPICAL | Status: DC | PRN
  Administered 2023-07-09: 500 mL

## 2023-07-09 MED ORDER — SEVOFLURANE IN SOLN
RESPIRATORY_TRACT | Status: AC
  Filled 2023-07-09: qty 250

## 2023-07-09 MED ORDER — VANCOMYCIN HCL 750 MG/150ML IV SOLN: 750.0000 mg | INTRAVENOUS | Status: DC

## 2023-07-09 MED ORDER — DEXAMETHASONE SODIUM PHOSPHATE 10 MG/ML IJ SOLN
INTRAMUSCULAR | Status: AC
  Filled 2023-07-09: qty 1

## 2023-07-09 MED ORDER — VANCOMYCIN HCL 1000 MG IV SOLR
INTRAVENOUS | Status: AC
  Filled 2023-07-09: qty 20

## 2023-07-09 MED ORDER — PROPOFOL 10 MG/ML IV BOLUS
INTRAVENOUS | Status: AC
  Filled 2023-07-09: qty 20

## 2023-07-09 MED ORDER — PENTAFLUOROPROP-TETRAFLUOROETH EX AERO
1.0000 | INHALATION_SPRAY | CUTANEOUS | Status: DC | PRN
  Filled 2023-07-09: qty 30

## 2023-07-09 MED ORDER — BUPIVACAINE HCL (PF) 0.5 % IJ SOLN
INTRAMUSCULAR | Status: AC
  Filled 2023-07-09: qty 30

## 2023-07-09 MED ORDER — PROPOFOL 10 MG/ML IV BOLUS
INTRAVENOUS | Status: DC | PRN
  Administered 2023-07-09: 100 mg via INTRAVENOUS
  Administered 2023-07-09: 50 mg via INTRAVENOUS

## 2023-07-09 MED ORDER — OXYCODONE HCL 5 MG PO TABS: 5.0000 mg | ORAL_TABLET | Freq: Once | ORAL | Status: DC | PRN

## 2023-07-09 MED ORDER — CHLORHEXIDINE GLUCONATE CLOTH 2 % EX PADS: 6.0000 | MEDICATED_PAD | Freq: Every day | CUTANEOUS | Status: DC

## 2023-07-09 MED ORDER — VANCOMYCIN HCL 1 G IV SOLR
INTRAVENOUS | Status: DC | PRN
  Administered 2023-07-09: 1000 mg

## 2023-07-09 MED ORDER — FENTANYL CITRATE (PF) 100 MCG/2ML IJ SOLN
INTRAMUSCULAR | Status: AC
  Filled 2023-07-09: qty 2

## 2023-07-09 MED ORDER — LIDOCAINE HCL (CARDIAC) PF 100 MG/5ML IV SOSY
PREFILLED_SYRINGE | INTRAVENOUS | Status: DC | PRN
  Administered 2023-07-09: 100 mg via INTRAVENOUS

## 2023-07-09 MED ORDER — LACTATED RINGERS IV SOLN: INTRAVENOUS | Status: DC | PRN

## 2023-07-09 MED ORDER — ONDANSETRON HCL 4 MG/2ML IJ SOLN
INTRAMUSCULAR | Status: AC
  Filled 2023-07-09: qty 2

## 2023-07-09 MED ORDER — LIDOCAINE HCL (PF) 1 % IJ SOLN
INTRAMUSCULAR | Status: DC | PRN
  Administered 2023-07-09: 20 mL

## 2023-07-09 MED ORDER — DEXAMETHASONE SODIUM PHOSPHATE 10 MG/ML IJ SOLN
INTRAMUSCULAR | Status: DC | PRN
  Administered 2023-07-09: 5 mg via INTRAVENOUS

## 2023-07-09 MED ORDER — LIDOCAINE HCL (PF) 2 % IJ SOLN
INTRAMUSCULAR | Status: AC
  Filled 2023-07-09: qty 5

## 2023-07-09 MED ORDER — SODIUM CHLORIDE 0.9 % IR SOLN
Status: DC | PRN
  Administered 2023-07-09: 3000 mL

## 2023-07-09 MED ORDER — CEFAZOLIN SODIUM-DEXTROSE 2-4 GM/100ML-% IV SOLN
INTRAVENOUS | Status: AC
  Filled 2023-07-09: qty 100

## 2023-07-09 SURGICAL SUPPLY — 90 items
BAG COUNTER SPONGE SURGICOUNT (BAG) ×1 IMPLANT
BAG SPNG CNTER NS LX DISP (BAG) ×1
BLADE OSC/SAGITTAL MD 5.5X18 (BLADE) IMPLANT
BLADE OSC/SAGITTAL MD 9X18.5 (BLADE) ×1 IMPLANT
BLADE OSCILLATING/SAGITTAL (BLADE)
BLADE SURG 15 STRL LF DISP TIS (BLADE) ×2 IMPLANT
BLADE SURG 15 STRL SS (BLADE) ×2
BLADE SW THK.38XMED LNG THN (BLADE) ×1 IMPLANT
BNDG CMPR 5X4 CHSV STRCH STRL (GAUZE/BANDAGES/DRESSINGS) ×1
BNDG CMPR 5X4 KNIT ELC UNQ LF (GAUZE/BANDAGES/DRESSINGS) ×1
BNDG CMPR 6 X 5 YARDS HK CLSR (GAUZE/BANDAGES/DRESSINGS) ×1
BNDG CMPR 75X21 PLY HI ABS (MISCELLANEOUS)
BNDG CMPR 75X41 PLY HI ABS (GAUZE/BANDAGES/DRESSINGS) ×1
BNDG CMPR STD VLCR NS LF 5.8X3 (GAUZE/BANDAGES/DRESSINGS)
BNDG CMPR STD VLCR NS LF 5.8X4 (GAUZE/BANDAGES/DRESSINGS)
BNDG COHESIVE 4X5 TAN STRL LF (GAUZE/BANDAGES/DRESSINGS) ×1 IMPLANT
BNDG ELASTIC 3X5.8 VLCR NS LF (GAUZE/BANDAGES/DRESSINGS) IMPLANT
BNDG ELASTIC 4INX 5YD STR LF (GAUZE/BANDAGES/DRESSINGS) ×1 IMPLANT
BNDG ELASTIC 4X5.8 VLCR NS LF (GAUZE/BANDAGES/DRESSINGS) IMPLANT
BNDG ELASTIC 6INX 5YD STR LF (GAUZE/BANDAGES/DRESSINGS) ×1 IMPLANT
BNDG ESMARCH 4 X 12 STRL LF (GAUZE/BANDAGES/DRESSINGS) ×1
BNDG ESMARCH 4X12 STRL LF (GAUZE/BANDAGES/DRESSINGS) ×1 IMPLANT
BNDG GAUZE DERMACEA FLUFF 4 (GAUZE/BANDAGES/DRESSINGS) ×2 IMPLANT
BNDG GZE 12X3 1 PLY HI ABS (GAUZE/BANDAGES/DRESSINGS)
BNDG GZE DERMACEA 4 6PLY (GAUZE/BANDAGES/DRESSINGS) ×2
BNDG STRETCH 4X75 STRL LF (GAUZE/BANDAGES/DRESSINGS) ×1 IMPLANT
BNDG STRETCH GAUZE 3IN X12FT (GAUZE/BANDAGES/DRESSINGS) IMPLANT
CNTNR URN SCR LID CUP LEK RST (MISCELLANEOUS) IMPLANT
CONT SPEC 4OZ STRL OR WHT (MISCELLANEOUS) ×2
CUFF TOURN SGL QUICK 12 (TOURNIQUET CUFF) ×1 IMPLANT
CUFF TOURN SGL QUICK 18X4 (TOURNIQUET CUFF) ×1 IMPLANT
DRAIN PENROSE 12X.25 LTX STRL (MISCELLANEOUS) IMPLANT
DRAPE FLUOR MINI C-ARM 54X84 (DRAPES) ×1 IMPLANT
DRSG MEPILEX FLEX 3X3 (GAUZE/BANDAGES/DRESSINGS) IMPLANT
DURAPREP 26ML APPLICATOR (WOUND CARE) ×1 IMPLANT
ELECT REM PT RETURN 9FT ADLT (ELECTROSURGICAL) ×1
ELECTRODE REM PT RTRN 9FT ADLT (ELECTROSURGICAL) ×1 IMPLANT
GAUZE PACKING 0.25INX5YD STRL (GAUZE/BANDAGES/DRESSINGS) IMPLANT
GAUZE PACKING IODOFORM 1/2INX (GAUZE/BANDAGES/DRESSINGS) IMPLANT
GAUZE SPONGE 4X4 12PLY STRL (GAUZE/BANDAGES/DRESSINGS) ×1 IMPLANT
GAUZE STRETCH 2X75IN STRL (MISCELLANEOUS) IMPLANT
GAUZE XEROFORM 1X8 LF (GAUZE/BANDAGES/DRESSINGS) ×2 IMPLANT
GLOVE BIO SURGEON STRL SZ7 (GLOVE) ×1 IMPLANT
GLOVE INDICATOR 7.0 STRL GRN (GLOVE) ×1 IMPLANT
GLOVE INDICATOR 7.5 STRL GRN (GLOVE) ×1 IMPLANT
GOWN STRL REUS W/ TWL LRG LVL3 (GOWN DISPOSABLE) ×2 IMPLANT
GOWN STRL REUS W/TWL LRG LVL3 (GOWN DISPOSABLE) ×2
HANDLE YANKAUER SUCT BULB TIP (MISCELLANEOUS) IMPLANT
HANDPIECE VERSAJET DEBRIDEMENT (MISCELLANEOUS) IMPLANT
IV NS 1000ML (IV SOLUTION)
IV NS 1000ML BAXH (IV SOLUTION) IMPLANT
IV NS IRRIG 3000ML ARTHROMATIC (IV SOLUTION) IMPLANT
KIT TURNOVER KIT A (KITS) ×1 IMPLANT
LABEL OR SOLS (LABEL) IMPLANT
MANIFOLD NEPTUNE II (INSTRUMENTS) ×1 IMPLANT
NDL HYPO 25X1 1.5 SAFETY (NEEDLE) ×2 IMPLANT
NDL SAFETY ECLIP 18X1.5 (MISCELLANEOUS) ×1 IMPLANT
NEEDLE HYPO 25X1 1.5 SAFETY (NEEDLE) ×2 IMPLANT
NS IRRIG 500ML POUR BTL (IV SOLUTION) ×1 IMPLANT
PACK EXTREMITY ARMC (MISCELLANEOUS) ×1 IMPLANT
PACKING GAUZE IODOFORM 1INX5YD (GAUZE/BANDAGES/DRESSINGS) IMPLANT
PAD ABD DERMACEA PRESS 5X9 (GAUZE/BANDAGES/DRESSINGS) IMPLANT
PULSAVAC PLUS IRRIG FAN TIP (DISPOSABLE) ×1
RASP SM TEAR CROSS CUT (RASP) IMPLANT
SOL PREP PVP 2OZ (MISCELLANEOUS) ×1
SOLUTION PREP PVP 2OZ (MISCELLANEOUS) ×1 IMPLANT
SPONGE T-LAP 18X18 ~~LOC~~+RFID (SPONGE) ×1 IMPLANT
STAPLER SKIN PROX 35W (STAPLE) ×1 IMPLANT
STOCKINETTE M/LG 89821 (MISCELLANEOUS) ×1 IMPLANT
STOCKINETTE STRL 6IN 960660 (GAUZE/BANDAGES/DRESSINGS) ×1 IMPLANT
STRIP CLOSURE SKIN 1/4X4 (GAUZE/BANDAGES/DRESSINGS) ×1 IMPLANT
SUT ETHILON 2 0 FS 18 (SUTURE) ×2 IMPLANT
SUT ETHILON 2 0 FSLX (SUTURE) ×1 IMPLANT
SUT ETHILON 3-0 FS-10 30 BLK (SUTURE) ×1
SUT ETHILON 4-0 (SUTURE)
SUT ETHILON 4-0 FS2 18XMFL BLK (SUTURE)
SUT VIC AB 2-0 CT1 27 (SUTURE) ×1
SUT VIC AB 2-0 CT1 TAPERPNT 27 (SUTURE) ×1 IMPLANT
SUT VIC AB 2-0 SH 27 (SUTURE) ×1
SUT VIC AB 2-0 SH 27XBRD (SUTURE) ×1 IMPLANT
SUT VIC AB 3-0 SH 27 (SUTURE) ×1
SUT VIC AB 3-0 SH 27X BRD (SUTURE) ×2 IMPLANT
SUT VIC AB 4-0 FS2 27 (SUTURE) IMPLANT
SUTURE EHLN 3-0 FS-10 30 BLK (SUTURE) ×2 IMPLANT
SUTURE ETHLN 4-0 FS2 18XMF BLK (SUTURE) IMPLANT
SWAB CULTURE AMIES ANAERIB BLU (MISCELLANEOUS) IMPLANT
SYR 10ML LL (SYRINGE) ×2 IMPLANT
TIP FAN IRRIG PULSAVAC PLUS (DISPOSABLE) IMPLANT
TRAP FLUID SMOKE EVACUATOR (MISCELLANEOUS) ×1 IMPLANT
WATER STERILE IRR 500ML POUR (IV SOLUTION) ×1 IMPLANT

## 2023-07-09 NOTE — Anesthesia Preprocedure Evaluation (Addendum)
Anesthesia Evaluation  Patient identified by MRN, date of birth, ID band Patient awake    Reviewed: Allergy & Precautions, NPO status , Patient's Chart, lab work & pertinent test results, Unable to perform ROS - Chart review only  Airway Mallampati: II  TM Distance: >3 FB Neck ROM: full    Dental no notable dental hx. (+) Teeth Intact, Dental Advidsory Given   Pulmonary neg pulmonary ROS   Pulmonary exam normal breath sounds clear to auscultation       Cardiovascular Exercise Tolerance: Good hypertension, On Medications negative cardio ROS Normal cardiovascular exam Rhythm:regular Rate:Normal     Neuro/Psych  Headaches PSYCHIATRIC DISORDERS  Depression     Neuromuscular disease negative neurological ROS  negative psych ROS   GI/Hepatic negative GI ROS, Neg liver ROS,,,  Endo/Other  negative endocrine ROSdiabetes    Renal/GU Renal disease     Musculoskeletal   Abdominal   Peds  Hematology negative hematology ROS (+) Blood dyscrasia, anemia   Anesthesia Other Findings Past Medical History: No date: Anemia No date: Diabetes mellitus without complication (HCC)     Comment:  Type 1 DM No date: Essential hypertension No date: Headache 03/04/2013: Hypertension No date: Neurologic disorder     Comment:  Both feet No date: Neuromuscular disorder (HCC) No date: Recurrent UTI No date: Renal disorder  Past Surgical History: No date: abscess removal     Comment:  excision of bartholin cyst 11/23/2022: AMPUTATION; Left     Comment:  Procedure: LEFT GREAT TOE PARTIAL RAY AMPUTATION;                Surgeon: Linus Galas, DPM;  Location: [facility] ORS;  Service:              Podiatry;  Laterality: Left; 06/09/2022: CAPD INSERTION; N/A     Comment:  Procedure: LAPAROSCOPIC INSERTION CONTINUOUS AMBULATORY               PERITONEAL DIALYSIS  (CAPD) CATHETER, PD rep to be               present;  Surgeon: Henrene Dodge, MD;   Location: [facility]               ORS;  Service: General;  Laterality: N/A; 12/15/2022: CAPD INSERTION; N/A     Comment:  Procedure: LAPAROSCOPIC INSERTION CONTINUOUS AMBULATORY               PERITONEAL DIALYSIS  (CAPD) CATHETER;  Surgeon: Henrene Dodge, MD;  Location: [facility] ORS;  Service: General;                Laterality: N/A; 12/15/2022: CAPD REMOVAL; N/A     Comment:  Procedure: LAPAROSCOPIC REMOVAL CONTINUOUS AMBULATORY               PERITONEAL DIALYSIS  (CAPD) CATHETER, removal of old               catheter;  Surgeon: Henrene Dodge, MD;  Location: [facility]               ORS;  Service: General;  Laterality: N/A; 01/08/2023: CAPD REMOVAL; N/A     Comment:  Procedure: LAPAROSCOPIC REMOVAL CONTINUOUS AMBULATORY               PERITONEAL DIALYSIS  (CAPD) CATHETER;  Surgeon: Henrene Dodge, MD;  Location: [facility];  Service: General;                Laterality: N/A; 07/16/2022: CAPD REVISION; N/A     Comment:  Procedure: LAPAROSCOPIC REVISION CONTINUOUS AMBULATORY               PERITONEAL DIALYSIS  (CAPD) CATHETER;  Surgeon: Henrene Dodge, MD;  Location: [facility];  Service: General;                Laterality: N/A; 11/24/2022: CAPD REVISION; N/A     Comment:  Procedure: LAPAROSCOPIC REVISION CONTINUOUS AMBULATORY               PERITONEAL DIALYSIS  (CAPD) CATHETER;  Surgeon: Henrene Dodge, MD;  Location: [facility];  Service: General;                Laterality: N/A; 04/24/2022: DIALYSIS/PERMA CATHETER INSERTION; N/A     Comment:  Procedure: DIALYSIS/PERMA CATHETER INSERTION;  Surgeon:               Renford Dills, MD;  Location: [facility];               Service: Cardiovascular;  Laterality: N/A; 06/02/2022: DIALYSIS/PERMA CATHETER REMOVAL; N/A     Comment:  Procedure: DIALYSIS/PERMA CATHETER REMOVAL;  Surgeon:               Renford Dills, MD;  Location: [facility];               Service: Cardiovascular;  Laterality:  N/A; 12/28/2022: DIALYSIS/PERMA CATHETER REMOVAL; N/A     Comment:  Procedure: DIALYSIS/PERMA CATHETER REMOVAL;  Surgeon:               Annice Needy, MD;  Location: [facility];                Service: Cardiovascular;  Laterality: N/A; No date: INSERTION OF DIALYSIS CATHETER; Right     Comment:  ARM 11/27/2022: IR FLUORO GUIDE CV LINE RIGHT 11/27/2022: IR US GUIDE VASC ACCESS RIGHT 06/19/2023: IRRIGATION AND DEBRIDEMENT FOOT; Left     Comment:  Procedure: IRRIGATION AND DEBRIDEMENT FOOT PARTIAL 1ST               RAY AMPUTATION;  Surgeon: Rosetta Posner, DPM;  Location:               [facility];  Service: Orthopedics/Podiatry;  Laterality:               Left; 01/2011: Nexplanon 06/09/2022: UMBILICAL HERNIA REPAIR; N/A     Comment:  Procedure: HERNIA REPAIR UMBILICAL ADULT;  Surgeon:               Henrene Dodge, MD;  Location: [facility];  Service:               General;  Laterality: N/A;  BMI    Body Mass Index: 24.21 kg/m      Reproductive/Obstetrics negative OB ROS                             Anesthesia Physical Anesthesia Plan  ASA: 3 and emergent  Anesthesia Plan: MAC and General   Post-op Pain Management:    Induction: Intravenous  PONV  Risk Score and Plan: 3 and Midazolam, Ondansetron and Dexamethasone  Airway Management Planned: LMA  Additional Equipment:   Intra-op Plan:   Post-operative Plan: Extubation in OR  Informed Consent: I have reviewed the patients History and Physical, chart, labs and discussed the procedure including the risks, benefits and alternatives for the proposed anesthesia with the patient or authorized representative who has indicated his/her understanding and acceptance.     Dental Advisory Given  Plan Discussed with: CRNA  Anesthesia Plan Comments: (Patient consented for risks of anesthesia including but not limited to:  - adverse reactions to medications - damage to eyes, teeth, lips or other oral mucosa -  nerve damage due to positioning  - sore throat or hoarseness - Damage to heart, brain, nerves, lungs, other parts of body or loss of life  Patient voiced understanding.)       Anesthesia Quick Evaluation

## 2023-07-09 NOTE — Consult Note (Signed)
Pharmacy Antibiotic Note  Emma Thomas is a 27 y.o. female admitted on 07/07/2023 with  fever, body aches, and shortness of breath .  Pharmacy has been consulted for Vancomycin and Cefepime dosing. 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  Bone Culture from 8/3 surgery grew finegoldia and tissue culture grew finegoldia and S miti/oralis.  MRI with possible osteomyelitis of residual 1st ray.    Plan: 1) Continue vancomycin 750mg  IV qHD on TTS - will check vancomycin level prior to 4th dose 2) Cefepime 1g IV Q24 hours  Height: 5\' 6"  (167.6 cm) Weight: 72.9 kg (160 lb 11.5 oz) IBW/kg (Calculated) : 59.3  Temp (24hrs), Avg:98 F (36.7 C), Min:97.1 F (36.2 C), Max:99.9 F (37.7 C)  Recent Labs  Lab 07/07/23 1210 07/07/23 1348 07/07/23 1547 07/08/23 0622 07/08/23 1048 07/08/23 1632 07/08/23 2055 07/09/23 0431 07/09/23 1043  WBC 3.7*  --   --  22.3*  --   --  16.9* 14.3*  --   CREATININE  --    < >  --  10.86* 5.26* 6.00* 6.68* 6.57* 7.21*  LATICACIDVEN 4.5*  --  1.3  --   --   --   --   --   --    < > = values in this interval not displayed.    Estimated Creatinine Clearance: 11.9 mL/min (A) (by C-G formula based on SCr of 7.21 mg/dL (H)).    No Known Allergies  Antimicrobials this admission: Vancomycin 8/21 >>  Cefepime 8/21 >>   Dose adjustments this admission: N/A  Microbiology results: 8/21 BCx: NGTD MRSA PCR neg  Thank you for allowing pharmacy to be a part of this patient's care.  Juliette Alcide, PharmD, BCPS, BCIDP Work Cell: [phone] 07/09/2023 2:13 PM

## 2023-07-09 NOTE — ED Triage Notes (Signed)
Pt to ed via POV for infection in her foot. Pt was a patient in the ICU here where she left AMA. Pt states "they werent treating me right so I was leaving". But my husband told me to come back. Pt is caox4, in no acute distress. Pt still had her Ivs in from ICU in left arm. This RN removed both Ivs as she left the building and came back.

## 2023-07-09 NOTE — Progress Notes (Addendum)
[facility]  - [place] [facility]   PATIENT NAME: Emma Thomas    MR#:  [id number]  DATE OF BIRTH:  October 01, 1996  SUBJECTIVE:  patient overall is looking better. She still on insulin drip. Anion gap 17. NPO for foot surgery plan later today not sure what time. No family at bedside. Patient did get one unit blood transfusion   VITALS:  Blood pressure (!) 160/87, pulse 92, temperature 98.1 F (36.7 C), temperature source Oral, resp. rate 16, height 5\' 6"  (1.676 m), weight 72.9 kg, last menstrual period 06/14/2023, SpO2 98%.  PHYSICAL EXAMINATION:   GENERAL:  27 y.o.-year-old patient with no acute distress. Chronically ill LUNGS: Normal breath sounds bilaterally, no wheezing CARDIOVASCULAR: S1, S2 normal. No murmur   ABDOMEN: Soft, nontender, nondistended.  EXTREMITIES: No  edema b/l.   HD access + Left foot   NEUROLOGIC: nonfocal  patient is alert and awake  LABORATORY PANEL:  CBC Recent Labs  Lab 07/09/23 0431  WBC 14.3*  HGB 7.9*  HCT 23.2*  PLT 269    Chemistries  Recent Labs  Lab 07/07/23 1348 07/08/23 0622 07/09/23 1043  NA 128*   < > 134*  K 5.2*   < > 4.2  CL 96*   < > 95*  CO2 10*   < > 22  GLUCOSE 307*   < > 176*  BUN 60*   < > 44*  CREATININE 10.45*   < > 7.21*  CALCIUM 7.1*   < > 7.1*  AST 22  --   --   ALT 14  --   --   ALKPHOS 65  --   --   BILITOT 1.5*  --   --    < > = values in this interval not displayed.   Cardiac Enzymes No results for input(s): "TROPONINI" in the last 168 hours. RADIOLOGY:  MR FOOT LEFT WO CONTRAST  Result Date: 07/08/2023 CLINICAL DATA:  Fever and left foot pain. Recent first ray amputation. EXAM: MRI OF THE LEFT FOOT WITHOUT CONTRAST TECHNIQUE: Multiplanar, multisequence MR imaging of the left forefoot was performed. No intravenous contrast was administered. COMPARISON:  Left foot x-rays from same day. MRI left foot dated June 18, 2023. FINDINGS: Bones/Joint/Cartilage Prior first ray amputation  with small residual first metatarsal base. New marrow edema within the residual base, as well as the distal medial cuneiform. 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 No fracture or dislocation. No joint effusion. Ligaments Second through fifth toe collateral ligaments are intact. Lisfranc ligament is intact. Muscles and Tendons Second through fifth flexor and extensor tendons are intact. No tenosynovitis. Increased T2 signal within the intrinsic muscles of the forefoot, nonspecific, but likely related to diabetic muscle changes. Soft tissue Recurrent 4.5 x 2.9 x 2.5 cm complex fluid collection in the recent first metatarsal amputation surgical bed (series 8, image 10; series 9, images 9-10). Unchanged sinus tract extending to the plantar surface. No soft tissue mass. IMPRESSION: 1. Prior first ray amputation with small residual first metatarsal base. New marrow edema within the residual base, as well as the distal medial cuneiform, concerning for osteomyelitis. 2. Recurrent 4.5 x 2.9 x 2.5 cm abscess in the recent first metatarsal amputation surgical bed. Unchanged sinus tract extending to the plantar surface. 3. 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 Electronically Signed   By: Obie Dredge M.D.   On: 07/08/2023 08:25    Assessment and Plan Cielita Novack is a 27 year old female with  history of hypertension, hyperlipidemia, end-stage renal disease, insulin-dependent diabetes mellitus type 1, heart failure preserved ejection fraction, hypertension, anemia of chronic disease, vitamin D deficiency, history of diabetic foot infection, who presents to the emergency department for chief concerns of fever, body aches, and shortness of breath for several days.  Blood cultures x 2 have been ordered. Initial lactic acid 4.5.   DKA in type I diabetes with anion gap acidosis -- transfer patient to step down for IV insulin drip, metabolic panel Q4 -- IV fluids will reduce the  rate to void volume overload given patient being dialysis -- CO2 seven, Beta hydroxybutyric acid 5.9 -- A1c 8.0 --8/23-- labs overall improved. Sugars better. Anion gap 17. Will continue insulin drip gap closed.  Severe sepsis secondary to left foot abscess -- patient recently underwent partial first ray amputation by podiatry -- MRI shows abscess near left first metatarsal. -- Continue broad-spectrum antibiotic -- patient seen by podiatry Dr. Ether Griffins. Plans for incision and drainage and debridement tomorrow by Dr. Excell Seltzer -- blood culture negative -- ID consultation with Dr. Joylene Draft  Anemia of chronic disease end-stage renal disease on hemodialysis Hyperkalemia -- patient's baseline hemoglobin stays around 8.0 -- hemoglobin drop down to 6.8-- 1 unit blood transfusion--7.8 -- came in with potassium of 7.3--HD--3.9--4.2  Hyponatremia suspected due to elevated sugars -- hopefully will resolve once DKA improves  Hypertension -- continue present meds  Procedures: Family communication  none today Consults : podiatry, nephrology, ID CODE STATUS: full DVT Prophylaxis : heparin Level of care: Stepdown Status is: Inpatient Remains inpatient appropriate because: DKA, sepsis due to left foot infection    TOTAL TIME TAKING CARE OF THIS PATIENT:67minutes.  >50% time spent on counselling and coordination of care  Note: This dictation was prepared with Dragon dictation along with smaller phrase technology. Any transcriptional errors that result from this process are unintentional.  Enedina Finner M.D    [facility]   CC: Primary care physician; [name]

## 2023-07-09 NOTE — Op Note (Addendum)
PODIATRY / FOOT AND ANKLE SURGERY OPERATIVE REPORT    SURGEON: Rosetta Posner, DPM  PRE-OPERATIVE DIAGNOSIS:  1.  Abscess left foot 2.  Osteomyelitis left first ray  POST-OPERATIVE DIAGNOSIS: Same  PROCEDURE(S): Left foot incision and drainage with excision of wound and closure Left foot first ray partial amputation including first metatarsal and distal medial cuneiform Use of fluoroscopic imaging  HEMOSTASIS: Left ankle tourniquet  ANESTHESIA: General  ESTIMATED BLOOD LOSS: 20 cc  FINDING(S): 1.  Abscess present which appeared to be seropurulent - almost like seroma to the previous amputation site 2.  Ulceration at the plantar aspect of the foot or chronic sinus appear to probe to the area of the abscess 3.  Soft first metatarsal and distal aspect of the medial cuneiform consistent with osteomyelitis  PATHOLOGY/SPECIMEN(S):  1.  Wound culture left foot 2.  Bone path specimen with proximal margin marked in purple ink first metatarsal and bone culture first metatarsal 3.  Bone path specimen with proximal margin marked in purple ink first cuneiform and bone culture first cuneiform  INDICATIONS:   Emma Thomas is a 27 y.o. female who presents with recurrent infection to the left foot.  Patient came to the hospital due to feeling ill and had subsequent imaging performed of the left foot including x-ray and MRI which showed recurrent osteomyelitis to the first tarsometatarsal joint and abscess.  Also showed sinus to the bottom of the foot.  On examination patient's foot appears to be within normal limits and there does not appear to be much erythema or edema overall and does have a hyperkeratotic plug present to the plantar first metatarsal.  All treatment options were discussed with the patient both conservative and surgical attempts at correction include potential risks and complications at this time patient is elected for surgical procedure consisting of left partial first ray  amputation including first metatarsal and medial cuneiform, incision and drainage, excision of wound with closure.  No guarantees given.  Consent obtained prior to procedure.  DESCRIPTION: After obtaining full informed written consent, the patient was brought back to the operating room and placed supine upon the operating table.  The patient received IV antibiotics prior to induction.  After obtaining adequate anesthesia, the patient was prepped and draped in the standard fashion.  20 cc of half percent Marcaine plain was injected about the left first ray and slightly proximal to that.  An Esmarch bandage was used to exsanguinate the left lower extremity pneumatic ankle tourniquet was inflated  Attention was directed to the left plantar first metatarsal over the area of the hyperkeratotic plug.  This hyperkeratotic plug was resected with a 3-1 elliptical incision.  The incision was made deepened to the subcutaneous tissue and the skin was ellipsed and passed off the operative site.  Probing was performed deep to this area and did reveal seropurulent drainage from the area.  At this time a secondary incision was then made over the area of the previous amputation site over the first tarsometatarsal joint extending distally.  The incision was made straight to bone.  At this time all large amount of seropurulent discharge was able to be expressed, appeared to be more consistent with seroma.  A wound culture was taken to this area and passed off the operative site.  Circumferential dissection was then performed around the first metatarsal base and it was resected at the operative site.  A bone culture was taken from this area and the cartilage of the first metatarsal base  was marked with purple ink and sent off as a pathology specimen.  Both were passed off the operative site.  Any nonviable necrotic tissue at the area the operative sites was debrided and removed.  A curette was used to debride the tissues yet  further.  Some bleeding occurred at the first interspace and electrocauterization was used to ligate the area.  The surgical site was flushed with 3 L of normal sterile saline with a pulse lavage.  Utilizing a curling instrument a bone culture was taken from the medial cuneiform and passed off the operative site.  The medial cuneiform appeared to be very soft consistent with osteomyelitis.  At this time sagittal bone saw was used to resect the articular cartilage from the distal aspect of the medial cuneiform.  This was dissected free and passed off the operative site with the proximal margin marked in purple ink.  This was passed off for pathology.  C-arm imaging was utilized to verify resection of bone and no further bony fragments present.  None were visualized both clinically and radiographically at this point.  The surgical site was then assessed further for any further signs of infection none were present.  Deep closure was then obtained with 3-0 Vicryl.  The plantar wound and the dorsal medial incision were then packed with iodoform packing gauze.  A portion of the subcutaneous tissue was then reapproximated well coapted with 3-0 Vicryl.  The skin was then reapproximated well coapted with 3-0 nylon in horizontal mattress and simple type stitching at both incision sites.  The pneumatic ankle tourniquet was deflated and a prompt hyperemic response was noted all digits left foot.  Hemostasis appeared to be fairly well-controlled.  Applied Xeroform to the incisional areas followed by 4 x 4 gauze, ABD, Kerlix, Ace wrap with compression.  The patient tolerated the procedure and anesthesia well and was transferred to the recovery in vital signs stable vascular status intact all toes left foot.  Following a period of postoperative monitoring the patient be discharged back to the inpatient room with the appropriate orders, instructions, and medications.  Patient at least for the next 24 hours should remain  nonweightbearing to left lower extremity.  May consider heel weightbearing in surgical shoe after that time with the aid of physical therapy.  I am going to recommend that patient goes home on IV antibiotics due to recurrent infection.  Appreciate infectious disease recommendations, culture reports pending.  COMPLICATIONS: none  CONDITION: stable  Rosetta Posner, DPM

## 2023-07-09 NOTE — Anesthesia Procedure Notes (Signed)
Procedure Name: LMA Insertion Date/Time: 07/09/2023 2:38 PM  Performed by: Elisabeth Pigeon, CRNAPre-anesthesia Checklist: Patient identified, Emergency Drugs available, Suction available and Patient being monitored Patient Re-evaluated:Patient Re-evaluated prior to induction Oxygen Delivery Method: Circle system utilized Preoxygenation: Pre-oxygenation with 100% oxygen Induction Type: IV induction Ventilation: Mask ventilation without difficulty LMA: LMA inserted LMA Size: 3.0 Number of attempts: 1 Airway Equipment and Method: Stylet and Oral airway Placement Confirmation: ETT inserted through vocal cords under direct vision, positive ETCO2 and breath sounds checked- equal and bilateral Tube secured with: Tape Dental Injury: Teeth and Oropharynx as per pre-operative assessment

## 2023-07-09 NOTE — Progress Notes (Signed)
Pt into room from I&D on left foot. Pt to monitor. BP elevated, VS otherwise stable. Left foot wrapped and to be elevated per surgery. Pt A/Ox4. Pt given Zofran for nausea with instant relief. Endo tool adjusted, and BMP drawn. Call light within reach.

## 2023-07-09 NOTE — Transfer of Care (Signed)
Immediate Anesthesia Transfer of Care Note  Patient: Emma Thomas  Procedure(s) Performed: IRRIGATION AND DEBRIDEMENT FOOT (Left) AMPUTATION First Ray (Left)  Patient Location: PACU  Anesthesia Type:General  Level of Consciousness: awake  Airway & Oxygen Therapy: Patient Spontanous Breathing and Patient connected to face mask oxygen  Post-op Assessment: Report given to RN and Post -op Vital signs reviewed and stable  Post vital signs: Reviewed and stable  Last Vitals:  Vitals Value Taken Time  BP 164/85 07/09/23 1810  Temp    Pulse 90 07/09/23 1813  Resp 16 07/09/23 1813  SpO2 93 % 07/09/23 1813  Vitals shown include unfiled device data.  Last Pain:  Vitals:   07/09/23 1524  TempSrc:   PainSc: 6       Patients Stated Pain Goal: 0 (07/09/23 0440)  Complications: No notable events documented.

## 2023-07-09 NOTE — Significant Event (Signed)
Informed by secretary that patient has left unit. Unknown if PIVs remain. Unit/2 floor checked and pt not found. Provider notified.

## 2023-07-09 NOTE — Anesthesia Postprocedure Evaluation (Signed)
Anesthesia Post Note  Patient: Emma Thomas  Procedure(s) Performed: IRRIGATION AND DEBRIDEMENT FOOT (Left: Foot) AMPUTATION First Ray (Left: Foot)  Patient location during evaluation: PACU Anesthesia Type: MAC Level of consciousness: awake and alert Pain management: pain level controlled Vital Signs Assessment: post-procedure vital signs reviewed and stable Respiratory status: spontaneous breathing, nonlabored ventilation, respiratory function stable and patient connected to nasal cannula oxygen Cardiovascular status: blood pressure returned to baseline and stable Postop Assessment: no apparent nausea or vomiting Anesthetic complications: no  No notable events documented.   Last Vitals:  Vitals:   07/09/23 1815 07/09/23 1830  BP: (!) 166/92 (!) 176/99  Pulse: 89 91  Resp: (!) 21 20  Temp:    SpO2: 93% 93%    Last Pain:  Vitals:   07/09/23 1811  TempSrc:   PainSc: Asleep                 Stephanie Coup

## 2023-07-09 NOTE — Progress Notes (Signed)
Patient disconnected insulin gtt and IV fluids. Provider notified. Requests to leave AMA. Provider notified to come to bedside to assess.

## 2023-07-09 NOTE — Progress Notes (Signed)
       CROSS COVER NOTE  NAME: Emma Thomas MRN: [id number] DOB : 04-Oct-1996    Concern as stated by nurse / staff    11F admit 8/21 sepsis/OR today I&D left 1st ray / DKA on insulin gtt. pt is NPO/can have crackers. Pt wants full meal. pt disconnected insulin gtt and IVF and wants to leave AMA. offered crackers (on-going N/V) but pt refuses and wants full meal. Request eval from you if able to sign out ama  Messafe received 2055   Pertinent findings on chart review:   Assessment and  Interventions   Assessment: Arrrived to unit 2105 to talk to patient. Informed by nursing staff patient walked out of unit and left with IV still in place. House supervisor was informed       Donnie Mesa NP [facility] Cross Cover 7pm-7am - check amion for availability Pager [phone]

## 2023-07-09 NOTE — Progress Notes (Signed)
Pt in surgery. Could not see her Continue iv vanco and cefepime Await cultures to deescalate

## 2023-07-09 NOTE — Plan of Care (Signed)
Problem: Fluid Volume: Goal: Hemodynamic stability will improve 07/09/2023 2047 by Brooke Bonito, RN Outcome: Progressing 07/09/2023 2046 by Brooke Bonito, RN Outcome: Progressing   Problem: Clinical Measurements: Goal: Diagnostic test results will improve 07/09/2023 2047 by Brooke Bonito, RN Outcome: Progressing 07/09/2023 2046 by Brooke Bonito, RN Outcome: Progressing Goal: Signs and symptoms of infection will decrease 07/09/2023 2047 by Brooke Bonito, RN Outcome: Progressing 07/09/2023 2046 by Brooke Bonito, RN Outcome: Progressing   Problem: Respiratory: Goal: Ability to maintain adequate ventilation will improve 07/09/2023 2047 by Brooke Bonito, RN Outcome: Progressing 07/09/2023 2046 by Brooke Bonito, RN Outcome: Progressing   Problem: Education: Goal: Knowledge of General Education information will improve Description: Including pain rating scale, medication(s)/side effects and non-pharmacologic comfort measures 07/09/2023 2047 by Brooke Bonito, RN Outcome: Progressing 07/09/2023 2046 by Brooke Bonito, RN Outcome: Progressing   Problem: Health Behavior/Discharge Planning: Goal: Ability to manage health-related needs will improve 07/09/2023 2047 by Brooke Bonito, RN Outcome: Progressing 07/09/2023 2046 by Brooke Bonito, RN Outcome: Progressing   Problem: Clinical Measurements: Goal: Ability to maintain clinical measurements within normal limits will improve 07/09/2023 2047 by Brooke Bonito, RN Outcome: Progressing 07/09/2023 2046 by Brooke Bonito, RN Outcome: Progressing Goal: Will remain free from infection 07/09/2023 2047 by Brooke Bonito, RN Outcome: Progressing 07/09/2023 2046 by Brooke Bonito, RN Outcome: Progressing Goal: Diagnostic test results will improve 07/09/2023 2047 by Brooke Bonito, RN Outcome: Progressing 07/09/2023 2046 by Brooke Bonito, RN Outcome: Progressing Goal: Respiratory complications will  improve 07/09/2023 2047 by Brooke Bonito, RN Outcome: Progressing 07/09/2023 2046 by Brooke Bonito, RN Outcome: Progressing Goal: Cardiovascular complication will be avoided 07/09/2023 2047 by Brooke Bonito, RN Outcome: Progressing 07/09/2023 2046 by Brooke Bonito, RN Outcome: Progressing   Problem: Activity: Goal: Risk for activity intolerance will decrease 07/09/2023 2047 by Brooke Bonito, RN Outcome: Progressing 07/09/2023 2046 by Brooke Bonito, RN Outcome: Progressing   Problem: Nutrition: Goal: Adequate nutrition will be maintained 07/09/2023 2047 by Brooke Bonito, RN Outcome: Progressing 07/09/2023 2046 by Brooke Bonito, RN Outcome: Progressing   Problem: Coping: Goal: Level of anxiety will decrease 07/09/2023 2047 by Brooke Bonito, RN Outcome: Progressing 07/09/2023 2046 by Brooke Bonito, RN Outcome: Progressing   Problem: Elimination: Goal: Will not experience complications related to bowel motility 07/09/2023 2047 by Brooke Bonito, RN Outcome: Progressing 07/09/2023 2046 by Brooke Bonito, RN Outcome: Progressing Goal: Will not experience complications related to urinary retention 07/09/2023 2047 by Brooke Bonito, RN Outcome: Progressing 07/09/2023 2046 by Brooke Bonito, RN Outcome: Progressing   Problem: Pain Managment: Goal: General experience of comfort will improve 07/09/2023 2047 by Brooke Bonito, RN Outcome: Progressing 07/09/2023 2046 by Brooke Bonito, RN Outcome: Progressing   Problem: Safety: Goal: Ability to remain free from injury will improve 07/09/2023 2047 by Brooke Bonito, RN Outcome: Progressing 07/09/2023 2046 by Brooke Bonito, RN Outcome: Progressing   Problem: Skin Integrity: Goal: Risk for impaired skin integrity will decrease 07/09/2023 2047 by Brooke Bonito, RN Outcome: Progressing 07/09/2023 2046 by Brooke Bonito, RN Outcome: Progressing   Problem: Education: Goal: Ability to  describe self-care measures that may prevent or decrease complications (Diabetes Survival Skills Education) will improve 07/09/2023 2047 by Brooke Bonito, RN Outcome: Progressing 07/09/2023 2046 by Brooke Bonito, RN Outcome: Progressing Goal: Individualized Educational Video(s) 07/09/2023 2047 by Brooke Bonito,  RN Outcome: Progressing 07/09/2023 2046 by Brooke Bonito, RN Outcome: Progressing   Problem: Coping: Goal: Ability to adjust to condition or change in health will improve 07/09/2023 2047 by Brooke Bonito, RN Outcome: Progressing 07/09/2023 2046 by Brooke Bonito, RN Outcome: Progressing   Problem: Fluid Volume: Goal: Ability to maintain a balanced intake and output will improve 07/09/2023 2047 by Brooke Bonito, RN Outcome: Progressing 07/09/2023 2046 by Brooke Bonito, RN Outcome: Progressing   Problem: Health Behavior/Discharge Planning: Goal: Ability to identify and utilize available resources and services will improve 07/09/2023 2047 by Brooke Bonito, RN Outcome: Progressing 07/09/2023 2046 by Brooke Bonito, RN Outcome: Progressing Goal: Ability to manage health-related needs will improve 07/09/2023 2047 by Brooke Bonito, RN Outcome: Progressing 07/09/2023 2046 by Brooke Bonito, RN Outcome: Progressing   Problem: Metabolic: Goal: Ability to maintain appropriate glucose levels will improve 07/09/2023 2047 by Brooke Bonito, RN Outcome: Progressing 07/09/2023 2046 by Brooke Bonito, RN Outcome: Progressing   Problem: Nutritional: Goal: Maintenance of adequate nutrition will improve 07/09/2023 2047 by Brooke Bonito, RN Outcome: Progressing 07/09/2023 2046 by Brooke Bonito, RN Outcome: Progressing Goal: Progress toward achieving an optimal weight will improve 07/09/2023 2047 by Brooke Bonito, RN Outcome: Progressing 07/09/2023 2046 by Brooke Bonito, RN Outcome: Progressing   Problem: Skin Integrity: Goal: Risk for impaired  skin integrity will decrease 07/09/2023 2047 by Brooke Bonito, RN Outcome: Progressing 07/09/2023 2046 by Brooke Bonito, RN Outcome: Progressing   Problem: Tissue Perfusion: Goal: Adequacy of tissue perfusion will improve 07/09/2023 2047 by Brooke Bonito, RN Outcome: Progressing 07/09/2023 2046 by Brooke Bonito, RN Outcome: Progressing   Problem: Education: Goal: Ability to describe self-care measures that may prevent or decrease complications (Diabetes Survival Skills Education) will improve 07/09/2023 2047 by Brooke Bonito, RN Outcome: Progressing 07/09/2023 2046 by Brooke Bonito, RN Outcome: Progressing Goal: Individualized Educational Video(s) 07/09/2023 2047 by Brooke Bonito, RN Outcome: Progressing 07/09/2023 2046 by Brooke Bonito, RN Outcome: Progressing   Problem: Cardiac: Goal: Ability to maintain an adequate cardiac output will improve 07/09/2023 2047 by Brooke Bonito, RN Outcome: Progressing 07/09/2023 2046 by Brooke Bonito, RN Outcome: Progressing   Problem: Health Behavior/Discharge Planning: Goal: Ability to identify and utilize available resources and services will improve 07/09/2023 2047 by Brooke Bonito, RN Outcome: Progressing 07/09/2023 2046 by Brooke Bonito, RN Outcome: Progressing Goal: Ability to manage health-related needs will improve 07/09/2023 2047 by Brooke Bonito, RN Outcome: Progressing 07/09/2023 2046 by Brooke Bonito, RN Outcome: Progressing   Problem: Fluid Volume: Goal: Ability to achieve a balanced intake and output will improve 07/09/2023 2047 by Brooke Bonito, RN Outcome: Progressing 07/09/2023 2046 by Brooke Bonito, RN Outcome: Progressing   Problem: Metabolic: Goal: Ability to maintain appropriate glucose levels will improve 07/09/2023 2047 by Brooke Bonito, RN Outcome: Progressing 07/09/2023 2046 by Brooke Bonito, RN Outcome: Progressing   Problem: Nutritional: Goal: Maintenance of  adequate nutrition will improve 07/09/2023 2047 by Brooke Bonito, RN Outcome: Progressing 07/09/2023 2046 by Brooke Bonito, RN Outcome: Progressing Goal: Maintenance of adequate weight for body size and type will improve 07/09/2023 2047 by Brooke Bonito, RN Outcome: Progressing 07/09/2023 2046 by Brooke Bonito, RN Outcome: Progressing   Problem: Respiratory: Goal: Will regain and/or maintain adequate ventilation 07/09/2023 2047 by Brooke Bonito, RN Outcome: Progressing 07/09/2023 2046 by Brooke Bonito, RN Outcome:  Progressing   Problem: Urinary Elimination: Goal: Ability to achieve and maintain adequate renal perfusion and functioning will improve 07/09/2023 2047 by Brooke Bonito, RN Outcome: Progressing 07/09/2023 2046 by Brooke Bonito, RN Outcome: Progressing   Problem: Education: Goal: Ability to describe self-care measures that may prevent or decrease complications (Diabetes Survival Skills Education) will improve Outcome: Progressing Goal: Individualized Educational Video(s) Outcome: Progressing   Problem: Coping: Goal: Ability to adjust to condition or change in health will improve Outcome: Progressing   Problem: Fluid Volume: Goal: Ability to maintain a balanced intake and output will improve Outcome: Progressing   Problem: Health Behavior/Discharge Planning: Goal: Ability to identify and utilize available resources and services will improve Outcome: Progressing Goal: Ability to manage health-related needs will improve Outcome: Progressing   Problem: Metabolic: Goal: Ability to maintain appropriate glucose levels will improve Outcome: Progressing   Problem: Nutritional: Goal: Maintenance of adequate nutrition will improve Outcome: Progressing Goal: Progress toward achieving an optimal weight will improve Outcome: Progressing   Problem: Skin Integrity: Goal: Risk for impaired skin integrity will decrease Outcome: Progressing   Problem:  Tissue Perfusion: Goal: Adequacy of tissue perfusion will improve Outcome: Progressing

## 2023-07-09 NOTE — Progress Notes (Addendum)
[facility]  ROUNDING NOTE   Subjective:   Emma Thomas is a 27 y.o female with past medical history of diabetes, anemia, and diabetes type 1, and end stage renal disease on peritoneal dialysis. Patient presents to the ED with fever and body aches. She has been admitted for Left foot pain [M79.672] Sepsis, due to unspecified organism, unspecified whether acute organ dysfunction present [facility]) [A41.9]  Patient is known to our practice and completes home hemodialysis supervised by Dr. Cherylann Ratel.    Patient laying in bed Alert  Insulin drip in place 1 unit blood transfusion received overnight NPO for I&D later today   Objective:  Vital signs in last 24 hours:  Temp:  [97.1 F (36.2 C)-99.9 F (37.7 C)] 97.1 F (36.2 C) (08/23 0800) Pulse Rate:  [82-111] 85 (08/23 1000) Resp:  [0-34] 16 (08/23 1000) BP: (118-176)/(68-106) 155/79 (08/23 1000) SpO2:  [90 %-100 %] 91 % (08/23 1000) Weight:  [72.9 kg] 72.9 kg (08/22 1401)  Weight change: 5 kg Filed Weights   07/07/23 1623 07/08/23 0805 07/08/23 1401  Weight: 69.5 kg 74.5 kg 72.9 kg    Intake/Output: I/O last 3 completed shifts: In: 1856.3 [P.O.:570; I.V.:756.3; Blood:280; IV Piggyback:250] Out: 525 [Urine:525]   Intake/Output this shift:  Total I/O In: 122.7 [I.V.:122.7] Out: 100 [Urine:100]  Physical Exam: General: NAD  Head: Normocephalic, atraumatic. Moist oral mucosal membranes  Eyes: Anicteric  Lungs:  Clear to auscultation  Heart: Regular rate and rhythm  Abdomen:  Soft, nontender  Extremities:  trace peripheral edema.  Neurologic: Nonfocal, moving all four extremities  Skin: No lesions  Access: Rt AVF    Basic Metabolic Panel: Recent Labs  Lab 07/08/23 0622 07/08/23 1048 07/08/23 1632 07/08/23 2055 07/09/23 0431  NA 132* 132* 132* 133* 138  K 7.3* 3.9 4.3 3.6 4.2  CL 102 92* 92* 92* 96*  CO2 7* 17* 14* 20* 25  GLUCOSE 370* 221* 344* 204* 117*  BUN 69* 34* 37* 41* 43*  CREATININE  10.86* 5.26* 6.00* 6.68* 6.57*  CALCIUM 6.5* 7.6* 7.3* 7.5* 7.6*  PHOS  --   --  6.5*  --   --     Liver Function Tests: Recent Labs  Lab 07/07/23 1348 07/08/23 1632  AST 22  --   ALT 14  --   ALKPHOS 65  --   BILITOT 1.5*  --   PROT 7.1  --   ALBUMIN 2.7* 2.4*   No results for input(s): "LIPASE", "AMYLASE" in the last 168 hours. No results for input(s): "AMMONIA" in the last 168 hours.  CBC: Recent Labs  Lab 07/07/23 1210 07/08/23 0622 07/08/23 2055 07/09/23 0431  WBC 3.7* 22.3* 16.9* 14.3*  NEUTROABS 3.3  --   --  11.2*  HGB 8.2* 6.5* 6.8* 7.9*  HCT 25.2* 20.2* 19.6* 23.2*  MCV 91.6 94.0 87.5 86.9  PLT 304 247 247 269    Cardiac Enzymes: No results for input(s): "CKTOTAL", "CKMB", "CKMBINDEX", "TROPONINI" in the last 168 hours.  BNP: Invalid input(s): "POCBNP"  CBG: Recent Labs  Lab 07/09/23 0532 07/09/23 0632 07/09/23 0731 07/09/23 0904 07/09/23 1016  GLUCAP 103* 116* 125* 131* 153*    Microbiology: Results for orders placed or performed during the hospital encounter of 07/07/23  Blood Culture (routine x 2)     Status: None (Preliminary result)   Collection Time: 07/07/23 12:10 PM   Specimen: BLOOD  Result Value Ref Range Status   Specimen Description BLOOD BLOOD LEFT ARM  Final  Special Requests   Final    BOTTLES DRAWN AEROBIC AND ANAEROBIC Blood Culture adequate volume   Culture   Final    NO GROWTH 2 DAYS Performed at [facility], [address] [place], [place] [address]    Report Status PENDING  Incomplete  SARS Coronavirus 2 by RT PCR (hospital order, performed in [facility] hospital lab) *cepheid single result test* Anterior Nasal Swab     Status: None   Collection Time: 07/07/23 12:10 PM   Specimen: Anterior Nasal Swab  Result Value Ref Range Status   SARS Coronavirus 2 by RT PCR NEGATIVE NEGATIVE Final    Comment: (NOTE) SARS-CoV-2 target nucleic acids are NOT DETECTED.  The SARS-CoV-2 RNA is generally detectable in  upper and lower respiratory specimens during the acute phase of infection. The lowest concentration of SARS-CoV-2 viral copies this assay can detect is 250 copies / mL. A negative result does not preclude SARS-CoV-2 infection and should not be used as the sole basis for treatment or other patient management decisions.  A negative result may occur with improper specimen collection / handling, submission of specimen other than nasopharyngeal swab, presence of viral mutation(s) within the areas targeted by this assay, and inadequate number of viral copies (<250 copies / mL). A negative result must be combined with clinical observations, patient history, and epidemiological information.  Fact Sheet for Patients:   RoadLapTop.co.za  Fact Sheet for Healthcare Providers: http://kim-miller.com/  This test is not yet approved or  cleared by the [place] FDA and has been authorized for detection and/or diagnosis of SARS-CoV-2 by FDA under an Emergency Use Authorization (EUA).  This EUA will remain in effect (meaning this test can be used) for the duration of the COVID-19 declaration under Section 564(b)(1) of the Act, 21 U.S.C. section 360bbb-3(b)(1), unless the authorization is terminated or revoked sooner.  Performed at [facility], [address] [place], [place] [address]   Culture, blood (Routine X 2) w Reflex to ID Panel     Status: None (Preliminary result)   Collection Time: 07/07/23 10:46 PM   Specimen: BLOOD  Result Value Ref Range Status   Specimen Description BLOOD  LAC  Final   Special Requests   Final    BOTTLES DRAWN AEROBIC AND ANAEROBIC Blood Culture adequate volume   Culture   Final    NO GROWTH 2 DAYS Performed at [facility], [address], [place], [place] [address]    Report Status PENDING  Incomplete  MRSA Next Gen by PCR, Nasal     Status: None   Collection Time: 07/07/23 11:50 PM    Specimen: Nasal Mucosa; Nasal Swab  Result Value Ref Range Status   MRSA by PCR Next Gen NOT DETECTED NOT DETECTED Final    Comment: (NOTE) The GeneXpert MRSA Assay (FDA approved for NASAL specimens only), is one component of a comprehensive MRSA colonization surveillance program. It is not intended to diagnose MRSA infection nor to guide or monitor treatment for MRSA infections. Test performance is not FDA approved in patients less than 73 years old. Performed at [facility], [address] [place], [place] [address]     Coagulation Studies: Recent Labs    07/07/23 1348  LABPROT 16.4*  INR 1.3*    Urinalysis: Recent Labs    07/07/23 1547  COLORURINE YELLOW*  LABSPEC 1.011  PHURINE 6.0  GLUCOSEU >=500*  HGBUR SMALL*  BILIRUBINUR NEGATIVE  KETONESUR 20*  PROTEINUR >=300*  NITRITE  NEGATIVE  LEUKOCYTESUR NEGATIVE      Imaging: MR FOOT LEFT WO CONTRAST  Result Date: 07/08/2023 CLINICAL DATA:  Fever and left foot pain. Recent first ray amputation. EXAM: MRI OF THE LEFT FOOT WITHOUT CONTRAST TECHNIQUE: Multiplanar, multisequence MR imaging of the left forefoot was performed. No intravenous contrast was administered. COMPARISON:  Left foot x-rays from same day. MRI left foot dated June 18, 2023. FINDINGS: Bones/Joint/Cartilage Prior first ray amputation with small residual first metatarsal base. New marrow edema within the residual base, as well as the distal medial cuneiform. 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 No fracture or dislocation. No joint effusion. Ligaments Second through fifth toe collateral ligaments are intact. Lisfranc ligament is intact. Muscles and Tendons Second through fifth flexor and extensor tendons are intact. No tenosynovitis. Increased T2 signal within the intrinsic muscles of the forefoot, nonspecific, but likely related to diabetic muscle changes. Soft tissue Recurrent 4.5 x 2.9 x 2.5 cm complex fluid collection in the  recent first metatarsal amputation surgical bed (series 8, image 10; series 9, images 9-10). Unchanged sinus tract extending to the plantar surface. No soft tissue mass. IMPRESSION: 1. Prior first ray amputation with small residual first metatarsal base. New marrow edema within the residual base, as well as the distal medial cuneiform, concerning for osteomyelitis. 2. Recurrent 4.5 x 2.9 x 2.5 cm abscess in the recent first metatarsal amputation surgical bed. Unchanged sinus tract extending to the plantar surface. 3. 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 Electronically Signed   By: Obie Dredge M.D.   On: 07/08/2023 08:25   DG Chest Port 1 View  Result Date: 07/07/2023 CLINICAL DATA:  Questionable sepsis - evaluate for abnormality fever and chills. EXAM: PORTABLE CHEST 1 VIEW COMPARISON:  08/10/2022. FINDINGS: Bilateral lung fields are clear. Bilateral costophrenic angles are clear. Normal cardio-mediastinal silhouette. No acute osseous abnormalities. The soft tissues are within normal limits. IMPRESSION: No active disease. Electronically Signed   By: Jules Schick M.D.   On: 07/07/2023 13:39   DG Foot 2 Views Left  Result Date: 07/07/2023 CLINICAL DATA:  L foot pain, fever.  Recent surgery-2 weeks back. EXAM: LEFT FOOT - 2 VIEW COMPARISON:  06/19/2023. FINDINGS: Redemonstration of amputation of great toe, near the proximal base of the first metatarsal. The resection margin is sharp. No focal bone erosions noted. There is interval disuse osteopenia. No evidence of focal soft tissue defect or air within the soft tissue at the surgical resection site/stump. 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 Otherwise, no acute fracture or dislocation. No aggressive osseous lesion. No significant degenerative changes of imaged joints. No focal soft tissue swelling. No radiopaque foreign bodies. IMPRESSION: 1. Redemonstration of  great toe amputation. No radiographic evidence of osteomyelitis. Correlate clinically to determine the need for additional imaging with more sensitive modality such as MRI. 2. 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 Electronically Signed   By: Jules Schick M.D.   On: 07/07/2023 13:31     Medications:     ceFAZolin (ANCEF) IV     ceFEPime (MAXIPIME) IV Stopped (07/08/23 1832)   dextrose 5% lactated ringers 50 mL/hr at 07/09/23 0914   insulin 0.5 Units/hr (07/09/23 0914)   lactated ringers Stopped (07/08/23 1640)   lactated ringers Stopped (07/08/23 2010)    sodium chloride   Intravenous Once   amLODipine  10 mg Oral Daily   atorvastatin  80 mg Oral Daily  bumetanide  1 mg Oral BID   Chlorhexidine Gluconate Cloth  6 each Topical Q0600   furosemide  80 mg Oral Daily   heparin injection (subcutaneous)  5,000 Units Subcutaneous Q8H   losartan  100 mg Oral Daily   montelukast  10 mg Oral QHS   multivitamin  1 tablet Oral QHS   norethindrone-ethinyl estradiol  1 tablet Oral Daily   sevelamer carbonate  1,600 mg Oral TID WC   acetaminophen **OR** acetaminophen, dextrose, HYDROmorphone (DILAUDID) injection, ondansetron **OR** ondansetron (ZOFRAN) IV, senna-docusate, sevelamer carbonate **AND** sevelamer carbonate, SUMAtriptan  Assessment/ Plan:  Ms. LATARCHA PREAST is a 27 y.o.  female with past medical history of diabetes, anemia, and diabetes type 1, and end stage renal disease on peritoneal dialysis. Patient presents to the ED with fever and body aches. She has been admitted for Left foot pain [M79.672] Sepsis, due to unspecified organism, unspecified whether acute organ dysfunction present (HCC) [A41.9]   End stage renal disease with hyperkalemia on home hemodialysis.  Patient received dialysis yesterday, terminated treatment early. UF 1.6L achieved.  Next treatment scheduled for Saturday  2. Anemia of chronic kidney disease Lab Results  Component  Value Date   HGB 7.9 (L) 07/09/2023    Hgb has improved with 1 unit blood transfusion overnight. Patient receives Mircera during outpatient appts.   3. Secondary Hyperparathyroidism: with outpatient labs: None available   Lab Results  Component Value Date   CALCIUM 7.6 (L) 07/09/2023   CAION 0.97 (L) 01/08/2023   PHOS 6.5 (H) 07/08/2023    Patient prescribed renvela with meals outpatient. Continue renvela with meals.  4. Diabetes mellitus type II with chronic kidney disease/renal manifestations: insulin dependent. Home regimen includes humalog. Most recent hemoglobin A1c is 8.5 on 06/18/23.   Glucose well controlled  5. Sepsis secondary to left foot abscess. 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 MRI left foot concerning for osteomyelitis with infected abscess. Podiatry planning I&D later today. Continue antibiotics per primary team   LOS: 2   8/23/202410:49 AM

## 2023-07-09 NOTE — Progress Notes (Signed)
Patient refusing CBG/provider notified

## 2023-07-09 NOTE — Inpatient Diabetes Management (Signed)
Inpatient Diabetes Program Recommendations  AACE/ADA: New Consensus Statement on Inpatient Glycemic Control (2015)  Target Ranges:  Prepandial:   less than 140 mg/dL      Peak postprandial:   less than 180 mg/dL (1-2 hours)      Critically ill patients:  140 - 180 mg/dL   Lab Results  Component Value Date   GLUCAP 131 (H) 07/09/2023   HGBA1C 8.5 (H) 06/18/2023    Review of Glycemic Control  Diabetes history: DM1 Outpatient Diabetes medications: T-Slim insulin pump with Humalog; Dexcom G7 CGM  Current orders for Inpatient glycemic control: IV insulin  Inpatient Diabetes Program Recommendations:   Spoke with Dr. Allena Katz who is waiting for morning lab results. If labs permit, may consider: -Transition to Semglee 14 units (80% home insulin basal rate) and give 2 hrs. Prior to IV insulin being discontinued. -Novolog 0-9 units q 4 hrs. And cover CBG @ time of IV insulin discontinued  Noted patient surgery planned for this afternoon.  Thank you, Billy Fischer. Luccas Towell, RN, MSN, CDE  Diabetes Coordinator Inpatient Glycemic Control Team Team Pager [phone] (8am-5pm) 07/09/2023 10:15 AM

## 2023-07-09 NOTE — H&P (Addendum)
HISTORY AND PHYSICAL INTERVAL NOTE:  07/09/2023  4:16 PM  Emma Thomas  has presented today for surgery, with the diagnosis of Infection.  The various methods of treatment have been discussed with the patient.  No guarantees were given.  After consideration of risks, benefits and other options for treatment, the patient has consented to surgery.  I have reviewed the patients' chart and labs.    PROCEDURE: LEFT FOOT INCISION AND DRAINAGE REMOVAL OF INFECTED BONE LEFT FOOT (1ST RAY) WITH BONE BIOPSY POSSIBLE APPLICATION OF ANTIBIOTIC BEADS  A history and physical examination was performed in the hospital.  The patient was reexamined.  There have been no changes to this history and physical examination by Dr. Ether Griffins.  Rosetta Posner, DPM

## 2023-07-09 NOTE — Progress Notes (Deleted)
Responded to Code Stroke. Pt awake.Provider, RN at bedside.

## 2023-07-09 NOTE — TOC Initial Note (Signed)
Transition of Care [facility]) - Initial/Assessment Note    Patient Details  Name: Emma Thomas MRN: [id number] Date of Birth: 29-Aug-1996  Transition of Care [facility]) CM/SW Contact:    Kreg Shropshire, RN Phone Number: 07/09/2023, 11:06 AM  Clinical Narrative:                  Pt arrived from ED from: home Caregiver Support: Spouse DME at Home: Transportation: family Previous Services: none HH/SNF Preference: none First Person of Contact: Spouse Shann Medal PCP: [facility] in [place]  Readmission Prevention Screening completed.  Cm will continue to follow for toc needs and d/c planning.    Barriers to Discharge: No Barriers Identified   Patient Goals and CMS Choice   CMS Medicare.gov Compare Post Acute Care list provided to:: Patient Choice offered to / list presented to : Patient      Expected Discharge Plan and Services     Post Acute Care Choice: Home Health, Skilled Nursing Facility                                        Prior Living Arrangements/Services   Lives with:: Self, Spouse                   Activities of Daily Living Home Assistive Devices/Equipment: None ADL Screening (condition at time of admission) Patient's cognitive ability adequate to safely complete daily activities?: Yes Is the patient deaf or have difficulty hearing?: No Does the patient have difficulty seeing, even when wearing glasses/contacts?: No Does the patient have difficulty concentrating, remembering, or making decisions?: No Patient able to express need for assistance with ADLs?: Yes Does the patient have difficulty dressing or bathing?: No Independently performs ADLs?: Yes (appropriate for developmental age) Does the patient have difficulty walking or climbing stairs?: No Weakness of Legs: Left Weakness of Arms/Hands: None  Permission Sought/Granted                  Emotional Assessment   Attitude/Demeanor/Rapport: Engaged Affect (typically  observed): Calm Orientation: : Oriented to Self, Oriented to Place, Oriented to  Time, Oriented to Situation      Admission diagnosis:  Left foot pain [M79.672] Sepsis, due to unspecified organism, unspecified whether acute organ dysfunction present [facility]) [A41.9] Patient Active Problem List   Diagnosis Date Noted   Left foot pain 07/07/2023   Severe sepsis (HCC) 07/07/2023   Diabetic foot infection (HCC) 06/18/2023   Chronic painful diabetic neuropathy (HCC) 04/22/2023   Neuropathic pain of foot, left 04/22/2023   Neuropathy of foot, right 04/22/2023   Chronic pain of left knee 04/22/2023   Hyperglycemia 04/07/2023   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 (HCC) 12/11/2022   Osteomyelitis of left foot (HCC) 11/25/2022   Hyponatremia 11/22/2022   Anemia of chronic disease 11/21/2022   Anasarca 11/21/2022   Acute osteomyelitis (HCC) 11/20/2022   Hypokalemia 11/20/2022   Acute idiopathic pericarditis 08/31/2022   Diastolic dysfunction 08/31/2022   PD catheter dysfunction (HCC)    Umbilical hernia without obstruction and without gangrene    CKD (chronic kidney disease) stage V requiring chronic dialysis (HCC) 05/22/2022   ESRD on dialysis (HCC) 04/22/2022   Vomiting 04/19/2022   Acute kidney injury (HCC) 12/26/2021   Cesarean delivery delivered 02/26/2020   Pre-eclampsia, severe, third trimester 02/15/2020   Labor and delivery indication for care  or intervention 02/13/2020   Chronic hypertension affecting pregnancy 11/08/2019   Anemia due to chronic kidney disease 11/08/2019   Pyelonephritis affecting pregnancy in first trimester 11/01/2019   Supervision of high risk pregnancy, antepartum 11/01/2019   Depression 10/27/2019   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) 10/27/2019   History of chlamydia infection 10/27/2019   History of depression 10/27/2019   Vitamin D deficiency 10/27/2019   Liver function abnormality 08/15/2018    Microalbuminuric diabetic nephropathy (HCC) 08/15/2018   High anion gap metabolic acidosis    AKI (acute kidney injury) (HCC)    Abdominal pain    Pyelonephritis 07/30/2016   Diabetic ketoacidosis without coma associated with type 1 diabetes mellitus (HCC) 09/09/2015   History of gonorrhea 08/13/2015   Intrauterine device 05/03/2015   Neuropathy 04/13/2013   Weight loss, non-intentional 04/13/2013   Depression, major, recurrent, moderate (HCC) 03/06/2013   Hypertension 03/04/2013   PCP:  [name]:   [facility] [phone] [place], [place] - [address] [place] [address] Phone: [phone] Fax: [phone]  [id number] - [place], [place] - [address] AT [facility] [address] [facility] [address] [place] [address] Phone: [phone] Fax: [phone]     Social Determinants of Health (SDOH) Social History: SDOH Screenings   Food Insecurity: No Food Insecurity (07/07/2023)  Housing: Low Risk  (07/07/2023)  Transportation Needs: No Transportation Needs (07/07/2023)  Utilities: Not At Risk (07/07/2023)  Financial Resource Strain: Low Risk  (05/31/2023)   Received from [facility]  Tobacco Use: Low Risk  (07/07/2023)   SDOH Interventions:     Readmission Risk Interventions    07/09/2023   11:05 AM 06/21/2023    1:28 PM 11/21/2022   11:46 AM  Readmission Risk Prevention Plan  Transportation Screening Complete Complete Complete  PCP or Specialist Appt within 3-5 Days  Complete Complete  HRI or Home Care Consult  Patient refused Complete  Social Work Consult for Recovery Care Planning/Counseling  Complete Complete  Palliative Care Screening  Not Applicable Not Applicable  Medication Review Oceanographer) Complete Complete Complete  PCP or Specialist appointment within 3-5 days of discharge Complete    HRI or Home Care Consult Not Complete    HRI or Home Care Consult Pt  Refusal Comments pt stated she does not have home care at this time and did not want home care.    SW Recovery Care/Counseling Consult Complete    Palliative Care Screening Not Applicable    Skilled Nursing Facility Not Applicable

## 2023-07-10 ENCOUNTER — Inpatient Hospital Stay
Admission: EM | Admit: 2023-07-10 | Discharge: 2023-07-13 | DRG: 637 | Disposition: A | Discharge status: Home / Self Care | Payer: 59 | Attending: Internal Medicine | Admitting: Internal Medicine

## 2023-07-10 DIAGNOSIS — R7989 Other specified abnormal findings of blood chemistry: Secondary | ICD-10-CM

## 2023-07-10 DIAGNOSIS — Z992 Dependence on renal dialysis: Secondary | ICD-10-CM

## 2023-07-10 DIAGNOSIS — L02612 Cutaneous abscess of left foot: Secondary | ICD-10-CM | POA: Diagnosis present

## 2023-07-10 DIAGNOSIS — E785 Hyperlipidemia, unspecified: Secondary | ICD-10-CM | POA: Diagnosis present

## 2023-07-10 DIAGNOSIS — E1169 Type 2 diabetes mellitus with other specified complication: Secondary | ICD-10-CM | POA: Diagnosis not present

## 2023-07-10 DIAGNOSIS — I5032 Chronic diastolic (congestive) heart failure: Secondary | ICD-10-CM | POA: Diagnosis present

## 2023-07-10 DIAGNOSIS — G43909 Migraine, unspecified, not intractable, without status migrainosus: Secondary | ICD-10-CM | POA: Diagnosis present

## 2023-07-10 DIAGNOSIS — E878 Other disorders of electrolyte and fluid balance, not elsewhere classified: Secondary | ICD-10-CM | POA: Diagnosis present

## 2023-07-10 DIAGNOSIS — M869 Osteomyelitis, unspecified: Secondary | ICD-10-CM | POA: Diagnosis present

## 2023-07-10 DIAGNOSIS — I10 Essential (primary) hypertension: Secondary | ICD-10-CM | POA: Insufficient documentation

## 2023-07-10 DIAGNOSIS — Z794 Long term (current) use of insulin: Secondary | ICD-10-CM | POA: Diagnosis not present

## 2023-07-10 DIAGNOSIS — I132 Hypertensive heart and chronic kidney disease with heart failure and with stage 5 chronic kidney disease, or end stage renal disease: Secondary | ICD-10-CM | POA: Diagnosis present

## 2023-07-10 DIAGNOSIS — E1022 Type 1 diabetes mellitus with diabetic chronic kidney disease: Secondary | ICD-10-CM | POA: Diagnosis present

## 2023-07-10 DIAGNOSIS — Z833 Family history of diabetes mellitus: Secondary | ICD-10-CM | POA: Diagnosis not present

## 2023-07-10 DIAGNOSIS — D72829 Elevated white blood cell count, unspecified: Secondary | ICD-10-CM | POA: Diagnosis present

## 2023-07-10 DIAGNOSIS — E1010 Type 1 diabetes mellitus with ketoacidosis without coma: Secondary | ICD-10-CM | POA: Diagnosis present

## 2023-07-10 DIAGNOSIS — E1110 Type 2 diabetes mellitus with ketoacidosis without coma: Secondary | ICD-10-CM | POA: Diagnosis present

## 2023-07-10 DIAGNOSIS — T783XXA Angioneurotic edema, initial encounter: Secondary | ICD-10-CM | POA: Diagnosis present

## 2023-07-10 DIAGNOSIS — I160 Hypertensive urgency: Secondary | ICD-10-CM | POA: Diagnosis present

## 2023-07-10 DIAGNOSIS — E1011 Type 1 diabetes mellitus with ketoacidosis with coma: Secondary | ICD-10-CM | POA: Diagnosis not present

## 2023-07-10 DIAGNOSIS — N186 End stage renal disease: Secondary | ICD-10-CM | POA: Diagnosis present

## 2023-07-10 DIAGNOSIS — M79672 Pain in left foot: Secondary | ICD-10-CM | POA: Diagnosis present

## 2023-07-10 DIAGNOSIS — E1069 Type 1 diabetes mellitus with other specified complication: Secondary | ICD-10-CM | POA: Diagnosis present

## 2023-07-10 DIAGNOSIS — E876 Hypokalemia: Secondary | ICD-10-CM | POA: Diagnosis present

## 2023-07-10 DIAGNOSIS — E871 Hypo-osmolality and hyponatremia: Secondary | ICD-10-CM | POA: Diagnosis present

## 2023-07-10 DIAGNOSIS — Z79899 Other long term (current) drug therapy: Secondary | ICD-10-CM | POA: Diagnosis not present

## 2023-07-10 DIAGNOSIS — E1122 Type 2 diabetes mellitus with diabetic chronic kidney disease: Secondary | ICD-10-CM | POA: Diagnosis not present

## 2023-07-10 DIAGNOSIS — I120 Hypertensive chronic kidney disease with stage 5 chronic kidney disease or end stage renal disease: Secondary | ICD-10-CM | POA: Diagnosis not present

## 2023-07-10 DIAGNOSIS — D631 Anemia in chronic kidney disease: Secondary | ICD-10-CM | POA: Diagnosis present

## 2023-07-10 DIAGNOSIS — M86172 Other acute osteomyelitis, left ankle and foot: Secondary | ICD-10-CM | POA: Diagnosis not present

## 2023-07-10 DIAGNOSIS — Z793 Long term (current) use of hormonal contraceptives: Secondary | ICD-10-CM | POA: Diagnosis not present

## 2023-07-10 DIAGNOSIS — Z87440 Personal history of urinary (tract) infections: Secondary | ICD-10-CM | POA: Diagnosis not present

## 2023-07-10 DIAGNOSIS — E10628 Type 1 diabetes mellitus with other skin complications: Secondary | ICD-10-CM | POA: Diagnosis present

## 2023-07-10 LAB — BASIC METABOLIC PANEL
Anion gap: 22 — ABNORMAL HIGH (ref 5–15)
Anion gap: 21 — ABNORMAL HIGH (ref 5–15)
Anion gap: 15 (ref 5–15)
Anion gap: 15 (ref 5–15)
Anion gap: 12 (ref 5–15)
BUN: 51 mg/dL — ABNORMAL HIGH (ref 6–20)
BUN: 53 mg/dL — ABNORMAL HIGH (ref 6–20)
BUN: 54 mg/dL — ABNORMAL HIGH (ref 6–20)
BUN: 56 mg/dL — ABNORMAL HIGH (ref 6–20)
BUN: 54 mg/dL — ABNORMAL HIGH (ref 6–20)
CO2: 12 mmol/L — ABNORMAL LOW (ref 22–32)
CO2: 14 mmol/L — ABNORMAL LOW (ref 22–32)
CO2: 20 mmol/L — ABNORMAL LOW (ref 22–32)
CO2: 20 mmol/L — ABNORMAL LOW (ref 22–32)
CO2: 21 mmol/L — ABNORMAL LOW (ref 22–32)
Calcium: 6.6 mg/dL — ABNORMAL LOW (ref 8.9–10.3)
Calcium: 7 mg/dL — ABNORMAL LOW (ref 8.9–10.3)
Calcium: 6.8 mg/dL — ABNORMAL LOW (ref 8.9–10.3)
Calcium: 6.6 mg/dL — ABNORMAL LOW (ref 8.9–10.3)
Calcium: 6.5 mg/dL — ABNORMAL LOW (ref 8.9–10.3)
Chloride: 95 mmol/L — ABNORMAL LOW (ref 98–111)
Chloride: 98 mmol/L (ref 98–111)
Chloride: 99 mmol/L (ref 98–111)
Chloride: 97 mmol/L — ABNORMAL LOW (ref 98–111)
Chloride: 100 mmol/L (ref 98–111)
Creatinine, Ser: 7.62 mg/dL — ABNORMAL HIGH (ref 0.44–1.00)
Creatinine, Ser: 7.59 mg/dL — ABNORMAL HIGH (ref 0.44–1.00)
Creatinine, Ser: 7.69 mg/dL — ABNORMAL HIGH (ref 0.44–1.00)
Creatinine, Ser: 7.95 mg/dL — ABNORMAL HIGH (ref 0.44–1.00)
Creatinine, Ser: 7.9 mg/dL — ABNORMAL HIGH (ref 0.44–1.00)
GFR, Estimated: 7 mL/min — ABNORMAL LOW (ref >60)
GFR, Estimated: 7 mL/min — ABNORMAL LOW (ref >60)
GFR, Estimated: 7 mL/min — ABNORMAL LOW (ref >60)
GFR, Estimated: 7 mL/min — ABNORMAL LOW (ref >60)
GFR, Estimated: 7 mL/min — ABNORMAL LOW (ref >60)
Glucose, Bld: 470 mg/dL — ABNORMAL HIGH (ref 70–99)
Glucose, Bld: 269 mg/dL — ABNORMAL HIGH (ref 70–99)
Glucose, Bld: 136 mg/dL — ABNORMAL HIGH (ref 70–99)
Glucose, Bld: 147 mg/dL — ABNORMAL HIGH (ref 70–99)
Glucose, Bld: 158 mg/dL — ABNORMAL HIGH (ref 70–99)
Potassium: 4.1 mmol/L (ref 3.5–5.1)
Potassium: 3.9 mmol/L (ref 3.5–5.1)
Potassium: 3.9 mmol/L (ref 3.5–5.1)
Potassium: 3.8 mmol/L (ref 3.5–5.1)
Potassium: 3.6 mmol/L (ref 3.5–5.1)
Sodium: 129 mmol/L — ABNORMAL LOW (ref 135–145)
Sodium: 133 mmol/L — ABNORMAL LOW (ref 135–145)
Sodium: 134 mmol/L — ABNORMAL LOW (ref 135–145)
Sodium: 132 mmol/L — ABNORMAL LOW (ref 135–145)
Sodium: 133 mmol/L — ABNORMAL LOW (ref 135–145)

## 2023-07-10 LAB — TYPE AND SCREEN
ABO/RH(D): O POS
Antibody Screen: NEGATIVE
Unit division: 0

## 2023-07-10 LAB — CBC
HCT: 23.3 % — ABNORMAL LOW (ref 36.0–46.0)
Hemoglobin: 7.8 g/dL — ABNORMAL LOW (ref 12.0–15.0)
MCH: 29.7 pg (ref 26.0–34.0)
MCHC: 33.5 g/dL (ref 30.0–36.0)
MCV: 88.6 fL (ref 80.0–100.0)
Platelets: 260 10*3/uL (ref 150–400)
RBC: 2.63 MIL/uL — ABNORMAL LOW (ref 3.87–5.11)
RDW: 12.4 % (ref 11.5–15.5)
WBC: 12.1 10*3/uL — ABNORMAL HIGH (ref 4.0–10.5)
nRBC: 0 % (ref 0.0–0.2)

## 2023-07-10 LAB — CBG MONITORING, ED
Glucose-Capillary: 436 mg/dL — ABNORMAL HIGH (ref 70–99)
Glucose-Capillary: 456 mg/dL — ABNORMAL HIGH (ref 70–99)
Glucose-Capillary: 407 mg/dL — ABNORMAL HIGH (ref 70–99)
Glucose-Capillary: 412 mg/dL — ABNORMAL HIGH (ref 70–99)
Glucose-Capillary: 375 mg/dL — ABNORMAL HIGH (ref 70–99)
Glucose-Capillary: 324 mg/dL — ABNORMAL HIGH (ref 70–99)
Glucose-Capillary: 288 mg/dL — ABNORMAL HIGH (ref 70–99)

## 2023-07-10 LAB — CBC WITH DIFFERENTIAL/PLATELET
Abs Immature Granulocytes: 0.07 10*3/uL (ref 0.00–0.07)
Basophils Absolute: 0 10*3/uL (ref 0.0–0.1)
Basophils Relative: 0 %
Eosinophils Absolute: 0 10*3/uL (ref 0.0–0.5)
Eosinophils Relative: 0 %
HCT: 29.7 % — ABNORMAL LOW (ref 36.0–46.0)
Hemoglobin: 9.3 g/dL — ABNORMAL LOW (ref 12.0–15.0)
Immature Granulocytes: 1 %
Lymphocytes Relative: 3 %
Lymphs Abs: 0.4 10*3/uL — ABNORMAL LOW (ref 0.7–4.0)
MCH: 29.1 pg (ref 26.0–34.0)
MCHC: 31.3 g/dL (ref 30.0–36.0)
MCV: 92.8 fL (ref 80.0–100.0)
Monocytes Absolute: 0.1 10*3/uL (ref 0.1–1.0)
Monocytes Relative: 1 %
Neutro Abs: 12.1 10*3/uL — ABNORMAL HIGH (ref 1.7–7.7)
Neutrophils Relative %: 95 %
Platelets: 331 10*3/uL (ref 150–400)
RBC: 3.2 MIL/uL — ABNORMAL LOW (ref 3.87–5.11)
RDW: 12.7 % (ref 11.5–15.5)
WBC: 12.6 10*3/uL — ABNORMAL HIGH (ref 4.0–10.5)
nRBC: 0.2 % (ref 0.0–0.2)

## 2023-07-10 LAB — RENAL FUNCTION PANEL
Albumin: 2.4 g/dL — ABNORMAL LOW (ref 3.5–5.0)
Anion gap: 14 (ref 5–15)
BUN: 54 mg/dL — ABNORMAL HIGH (ref 6–20)
CO2: 19 mmol/L — ABNORMAL LOW (ref 22–32)
Calcium: 6.5 mg/dL — ABNORMAL LOW (ref 8.9–10.3)
Chloride: 99 mmol/L (ref 98–111)
Creatinine, Ser: 8.04 mg/dL — ABNORMAL HIGH (ref 0.44–1.00)
GFR, Estimated: 6 mL/min — ABNORMAL LOW (ref >60)
Glucose, Bld: 155 mg/dL — ABNORMAL HIGH (ref 70–99)
Phosphorus: 5.6 mg/dL — ABNORMAL HIGH (ref 2.5–4.6)
Potassium: 3.5 mmol/L (ref 3.5–5.1)
Sodium: 132 mmol/L — ABNORMAL LOW (ref 135–145)

## 2023-07-10 LAB — COMPREHENSIVE METABOLIC PANEL
ALT: 16 U/L (ref 0–44)
AST: 18 U/L (ref 15–41)
Albumin: 2.9 g/dL — ABNORMAL LOW (ref 3.5–5.0)
Alkaline Phosphatase: 70 U/L (ref 38–126)
Anion gap: 25 — ABNORMAL HIGH (ref 5–15)
BUN: 49 mg/dL — ABNORMAL HIGH (ref 6–20)
CO2: 14 mmol/L — ABNORMAL LOW (ref 22–32)
Calcium: 7.4 mg/dL — ABNORMAL LOW (ref 8.9–10.3)
Chloride: 90 mmol/L — ABNORMAL LOW (ref 98–111)
Creatinine, Ser: 7.7 mg/dL — ABNORMAL HIGH (ref 0.44–1.00)
GFR, Estimated: 7 mL/min — ABNORMAL LOW (ref >60)
Glucose, Bld: 497 mg/dL — ABNORMAL HIGH (ref 70–99)
Potassium: 5 mmol/L (ref 3.5–5.1)
Sodium: 129 mmol/L — ABNORMAL LOW (ref 135–145)
Total Bilirubin: 1.9 mg/dL — ABNORMAL HIGH (ref 0.3–1.2)
Total Protein: 7.6 g/dL (ref 6.5–8.1)

## 2023-07-10 LAB — BPAM RBC
Blood Product Expiration Date: 202409192359
ISSUE DATE / TIME: 202408230039
Unit Type and Rh: 5100

## 2023-07-10 LAB — BLOOD GAS, VENOUS
Acid-base deficit: 18.4 mmol/L — ABNORMAL HIGH (ref 0.0–2.0)
Bicarbonate: 8.6 mmol/L — ABNORMAL LOW (ref 20.0–28.0)
O2 Saturation: 86.8 %
Patient temperature: 37
pCO2, Ven: 24 mmHg — ABNORMAL LOW (ref 44–60)
pH, Ven: 7.16 — CL (ref 7.25–7.43)
pO2, Ven: 60 mmHg — ABNORMAL HIGH (ref 32–45)

## 2023-07-10 LAB — GLUCOSE, CAPILLARY
Glucose-Capillary: 130 mg/dL — ABNORMAL HIGH (ref 70–99)
Glucose-Capillary: 131 mg/dL — ABNORMAL HIGH (ref 70–99)
Glucose-Capillary: 134 mg/dL — ABNORMAL HIGH (ref 70–99)
Glucose-Capillary: 137 mg/dL — ABNORMAL HIGH (ref 70–99)
Glucose-Capillary: 139 mg/dL — ABNORMAL HIGH (ref 70–99)
Glucose-Capillary: 141 mg/dL — ABNORMAL HIGH (ref 70–99)
Glucose-Capillary: 146 mg/dL — ABNORMAL HIGH (ref 70–99)
Glucose-Capillary: 149 mg/dL — ABNORMAL HIGH (ref 70–99)
Glucose-Capillary: 150 mg/dL — ABNORMAL HIGH (ref 70–99)
Glucose-Capillary: 155 mg/dL — ABNORMAL HIGH (ref 70–99)
Glucose-Capillary: 166 mg/dL — ABNORMAL HIGH (ref 70–99)
Glucose-Capillary: 178 mg/dL — ABNORMAL HIGH (ref 70–99)
Glucose-Capillary: 195 mg/dL — ABNORMAL HIGH (ref 70–99)
Glucose-Capillary: 230 mg/dL — ABNORMAL HIGH (ref 70–99)
Glucose-Capillary: 250 mg/dL — ABNORMAL HIGH (ref 70–99)

## 2023-07-10 LAB — BETA-HYDROXYBUTYRIC ACID
Beta-Hydroxybutyric Acid: >8 mmol/L — ABNORMAL HIGH (ref 0.05–0.27)
Beta-Hydroxybutyric Acid: 2.37 mmol/L — ABNORMAL HIGH (ref 0.05–0.27)
Beta-Hydroxybutyric Acid: 0.13 mmol/L (ref 0.05–0.27)
Beta-Hydroxybutyric Acid: 0.09 mmol/L (ref 0.05–0.27)

## 2023-07-10 LAB — HEPATITIS B SURFACE ANTIGEN: Hepatitis B Surface Ag: NONREACTIVE

## 2023-07-10 MED ORDER — SODIUM CHLORIDE 0.9 % IV BOLUS
20.0000 mL/kg | Freq: Once | INTRAVENOUS | Status: AC
  Administered 2023-07-10: 1360 mL via INTRAVENOUS

## 2023-07-10 MED ORDER — TRAZODONE HCL 50 MG PO TABS: 25.0000 mg | ORAL_TABLET | Freq: Every evening | ORAL | Status: DC | PRN

## 2023-07-10 MED ORDER — DEXTROSE-SODIUM CHLORIDE 5-0.45 % IV SOLN: INTRAVENOUS | Status: DC

## 2023-07-10 MED ORDER — CHLORHEXIDINE GLUCONATE CLOTH 2 % EX PADS
6.0000 | MEDICATED_PAD | Freq: Every day | CUTANEOUS | Status: DC
  Administered 2023-07-13: 6 via TOPICAL

## 2023-07-10 MED ORDER — ONDANSETRON HCL 4 MG PO TABS: 8.0000 mg | ORAL_TABLET | Freq: Every day | ORAL | Status: DC

## 2023-07-10 MED ORDER — MONTELUKAST SODIUM 10 MG PO TABS
10.0000 mg | ORAL_TABLET | Freq: Every day | ORAL | Status: DC
  Administered 2023-07-10 – 2023-07-12 (×3): 10 mg via ORAL
  Filled 2023-07-10 (×3): qty 1

## 2023-07-10 MED ORDER — SEVELAMER CARBONATE 800 MG PO TABS: 800.0000 mg | ORAL_TABLET | Freq: Every day | ORAL | Status: DC | PRN

## 2023-07-10 MED ORDER — ONDANSETRON HCL 4 MG/2ML IJ SOLN
4.0000 mg | Freq: Four times a day (QID) | INTRAMUSCULAR | Status: DC | PRN
  Administered 2023-07-10 – 2023-07-11 (×3): 4 mg via INTRAVENOUS
  Filled 2023-07-10 (×3): qty 2

## 2023-07-10 MED ORDER — ONDANSETRON HCL 4 MG PO TABS: 4.0000 mg | ORAL_TABLET | Freq: Four times a day (QID) | ORAL | Status: DC | PRN

## 2023-07-10 MED ORDER — INSULIN REGULAR(HUMAN) IN NACL 100-0.9 UT/100ML-% IV SOLN
INTRAVENOUS | Status: DC
  Administered 2023-07-10: 6 [IU]/h via INTRAVENOUS
  Filled 2023-07-10: qty 100

## 2023-07-10 MED ORDER — ONDANSETRON HCL 4 MG/2ML IJ SOLN
4.0000 mg | Freq: Once | INTRAMUSCULAR | Status: AC
  Administered 2023-07-10: 4 mg via INTRAVENOUS
  Filled 2023-07-10: qty 2

## 2023-07-10 MED ORDER — SODIUM CHLORIDE 0.9 % IV SOLN: INTRAVENOUS | Status: DC

## 2023-07-10 MED ORDER — VANCOMYCIN HCL 750 MG/150ML IV SOLN: 750.0000 mg | INTRAVENOUS | Status: DC

## 2023-07-10 MED ORDER — INSULIN GLARGINE-YFGN 100 UNIT/ML ~~LOC~~ SOLN
14.0000 [IU] | SUBCUTANEOUS | Status: DC
  Administered 2023-07-11 – 2023-07-12 (×2): 14 [IU] via SUBCUTANEOUS
  Filled 2023-07-10 (×3): qty 0.14

## 2023-07-10 MED ORDER — ACETAMINOPHEN 325 MG PO TABS
650.0000 mg | ORAL_TABLET | Freq: Four times a day (QID) | ORAL | Status: DC | PRN
  Administered 2023-07-11: 650 mg via ORAL
  Filled 2023-07-10 (×2): qty 2

## 2023-07-10 MED ORDER — POTASSIUM CHLORIDE 10 MEQ/100ML IV SOLN: 10.0000 meq | INTRAVENOUS | Status: AC

## 2023-07-10 MED ORDER — MAGNESIUM HYDROXIDE 400 MG/5ML PO SUSP: 30.0000 mL | Freq: Every day | ORAL | Status: DC | PRN

## 2023-07-10 MED ORDER — BUMETANIDE 1 MG PO TABS
1.0000 mg | ORAL_TABLET | Freq: Two times a day (BID) | ORAL | Status: DC
  Filled 2023-07-10: qty 1

## 2023-07-10 MED ORDER — ATORVASTATIN CALCIUM 20 MG PO TABS
80.0000 mg | ORAL_TABLET | Freq: Every day | ORAL | Status: DC
  Administered 2023-07-10 – 2023-07-13 (×3): 80 mg via ORAL
  Filled 2023-07-10 (×3): qty 4

## 2023-07-10 MED ORDER — LACTATED RINGERS IV BOLUS: 20.0000 mL/kg | Freq: Once | INTRAVENOUS | Status: DC

## 2023-07-10 MED ORDER — HEPARIN SODIUM (PORCINE) 5000 UNIT/ML IJ SOLN
5000.0000 [IU] | Freq: Three times a day (TID) | INTRAMUSCULAR | Status: DC
  Administered 2023-07-10 – 2023-07-13 (×10): 5000 [IU] via SUBCUTANEOUS
  Filled 2023-07-10 (×10): qty 1

## 2023-07-10 MED ORDER — MORPHINE SULFATE (PF) 2 MG/ML IV SOLN
2.0000 mg | INTRAVENOUS | Status: DC | PRN
  Administered 2023-07-10: 2 mg via INTRAVENOUS
  Filled 2023-07-10: qty 1

## 2023-07-10 MED ORDER — SODIUM CHLORIDE 0.9 % IV SOLN
2.0000 g | Freq: Once | INTRAVENOUS | Status: AC
  Administered 2023-07-10: 2 g via INTRAVENOUS
  Filled 2023-07-10: qty 12.5

## 2023-07-10 MED ORDER — HYDROMORPHONE HCL 1 MG/ML IJ SOLN
1.0000 mg | Freq: Once | INTRAMUSCULAR | Status: AC
  Administered 2023-07-10: 1 mg via INTRAVENOUS
  Filled 2023-07-10: qty 1

## 2023-07-10 MED ORDER — SODIUM CHLORIDE 0.9 % IV SOLN
1.0000 g | INTRAVENOUS | Status: DC
  Administered 2023-07-10 – 2023-07-11 (×2): 1 g via INTRAVENOUS
  Filled 2023-07-10 (×3): qty 10

## 2023-07-10 MED ORDER — DEXTROSE IN LACTATED RINGERS 5 % IV SOLN: INTRAVENOUS | Status: DC

## 2023-07-10 MED ORDER — DEXTROSE 50 % IV SOLN: 0.0000 mL | INTRAVENOUS | Status: DC | PRN

## 2023-07-10 MED ORDER — ENOXAPARIN SODIUM 40 MG/0.4ML IJ SOSY: 40.0000 mg | PREFILLED_SYRINGE | INTRAMUSCULAR | Status: DC

## 2023-07-10 MED ORDER — VANCOMYCIN HCL 1500 MG/300ML IV SOLN
1500.0000 mg | Freq: Once | INTRAVENOUS | Status: DC
  Filled 2023-07-10: qty 300

## 2023-07-10 MED ORDER — NORETHINDRONE ACET-ETHINYL EST 1-20 MG-MCG PO TABS
1.0000 | ORAL_TABLET | Freq: Every day | ORAL | Status: DC
  Filled 2023-07-10: qty 1

## 2023-07-10 MED ORDER — VANCOMYCIN HCL 750 MG/150ML IV SOLN
750.0000 mg | INTRAVENOUS | Status: DC
  Filled 2023-07-10: qty 150

## 2023-07-10 MED ORDER — SEVELAMER CARBONATE 800 MG PO TABS
1600.0000 mg | ORAL_TABLET | Freq: Three times a day (TID) | ORAL | Status: DC
  Administered 2023-07-10 – 2023-07-12 (×5): 1600 mg via ORAL
  Filled 2023-07-10 (×13): qty 2

## 2023-07-10 MED ORDER — RENA-VITE PO TABS
1.0000 | ORAL_TABLET | Freq: Every day | ORAL | Status: DC
  Administered 2023-07-10 – 2023-07-12 (×3): 1 via ORAL
  Filled 2023-07-10 (×3): qty 1

## 2023-07-10 MED ORDER — EPOETIN ALFA 10000 UNIT/ML IJ SOLN
10000.0000 [IU] | INTRAMUSCULAR | Status: DC
  Filled 2023-07-10: qty 1

## 2023-07-10 MED ORDER — OXYCODONE-ACETAMINOPHEN 5-325 MG PO TABS
1.0000 | ORAL_TABLET | Freq: Four times a day (QID) | ORAL | Status: DC | PRN
  Administered 2023-07-10 – 2023-07-12 (×7): 1 via ORAL
  Filled 2023-07-10 (×6): qty 1

## 2023-07-10 MED ORDER — SODIUM CHLORIDE 0.9 % IV BOLUS
1000.0000 mL | Freq: Once | INTRAVENOUS | Status: AC
  Administered 2023-07-10: 1000 mL via INTRAVENOUS

## 2023-07-10 MED ORDER — SODIUM CHLORIDE 0.9 % IV SOLN
2.0000 g | Freq: Two times a day (BID) | INTRAVENOUS | Status: DC
Start: 2023-07-10 — End: 2023-07-10

## 2023-07-10 MED ORDER — INSULIN REGULAR(HUMAN) IN NACL 100-0.9 UT/100ML-% IV SOLN
INTRAVENOUS | Status: DC
  Administered 2023-07-10: 8 [IU]/h via INTRAVENOUS
  Filled 2023-07-10 (×2): qty 100

## 2023-07-10 MED ORDER — HYDROMORPHONE HCL 1 MG/ML IJ SOLN
1.0000 mg | INTRAMUSCULAR | Status: DC | PRN
  Administered 2023-07-10 – 2023-07-13 (×13): 1 mg via INTRAVENOUS
  Filled 2023-07-10 (×12): qty 1

## 2023-07-10 MED ORDER — SUMATRIPTAN SUCCINATE 50 MG PO TABS: 50.0000 mg | ORAL_TABLET | Freq: Every day | ORAL | Status: DC | PRN

## 2023-07-10 MED ORDER — AMLODIPINE BESYLATE 10 MG PO TABS
10.0000 mg | ORAL_TABLET | Freq: Every day | ORAL | Status: DC
  Administered 2023-07-10 – 2023-07-13 (×3): 10 mg via ORAL
  Filled 2023-07-10 (×3): qty 1

## 2023-07-10 MED ORDER — LOSARTAN POTASSIUM 50 MG PO TABS
100.0000 mg | ORAL_TABLET | Freq: Every day | ORAL | Status: DC
  Administered 2023-07-10 – 2023-07-13 (×3): 100 mg via ORAL
  Filled 2023-07-10 (×3): qty 2

## 2023-07-10 MED ORDER — ACETAMINOPHEN 650 MG RE SUPP: 650.0000 mg | Freq: Four times a day (QID) | RECTAL | Status: DC | PRN

## 2023-07-10 MED ORDER — MORPHINE SULFATE (PF) 4 MG/ML IV SOLN
4.0000 mg | Freq: Once | INTRAVENOUS | Status: AC
  Administered 2023-07-10: 4 mg via INTRAVENOUS
  Filled 2023-07-10: qty 1

## 2023-07-10 MED ORDER — VITAMIN D (ERGOCALCIFEROL) 1.25 MG (50000 UNIT) PO CAPS
50000.0000 [IU] | ORAL_CAPSULE | ORAL | Status: DC
  Administered 2023-07-12: 50000 [IU] via ORAL
  Filled 2023-07-10: qty 1

## 2023-07-10 MED ORDER — INSULIN ASPART 100 UNIT/ML IJ SOLN
0.0000 [IU] | INTRAMUSCULAR | Status: DC
  Administered 2023-07-11: 2 [IU] via SUBCUTANEOUS
  Administered 2023-07-11: 1 [IU] via SUBCUTANEOUS
  Administered 2023-07-11 (×2): 2 [IU] via SUBCUTANEOUS
  Administered 2023-07-12 (×2): 1 [IU] via SUBCUTANEOUS
  Filled 2023-07-10 (×7): qty 1

## 2023-07-10 MED ORDER — SODIUM CHLORIDE 0.9 % IV SOLN: INTRAVENOUS | Status: DC | PRN

## 2023-07-10 NOTE — Assessment & Plan Note (Signed)
-   We will continue her Imitrex.

## 2023-07-10 NOTE — Plan of Care (Signed)
  Problem: Fluid Volume: Goal: Hemodynamic stability will improve Outcome: Progressing   Problem: Clinical Measurements: Goal: Diagnostic test results will improve Outcome: Progressing Goal: Signs and symptoms of infection will decrease Outcome: Progressing   Problem: Respiratory: Goal: Ability to maintain adequate ventilation will improve Outcome: Progressing   Problem: Education: Goal: Ability to describe self-care measures that may prevent or decrease complications (Diabetes Survival Skills Education) will improve Outcome: Progressing Goal: Individualized Educational Video(s) Outcome: Progressing   Problem: Cardiac: Goal: Ability to maintain an adequate cardiac output will improve Outcome: Progressing

## 2023-07-10 NOTE — Assessment & Plan Note (Addendum)
Blood pressure improving - Continue with home antihypertensives - We will place on as needed IV labetalol.

## 2023-07-10 NOTE — ED Provider Notes (Signed)
[facility] Provider Note    Event Date/Time   First MD Initiated Contact with Patient 07/10/23 0022     (approximate)   History   Wound Infection (Left Foot)   HPI  Emma Thomas is a 27 y.o. female who returns to the ED just hours after leaving the stepdown unit AMA.  Patient was admitted 2 days ago for DKA, left foot osteomyelitis.  Had surgical debridement yesterday, was on insulin drip.  Left because she was angry they would not let her eat.  She left the hospital, went to [facility] to eat a hamburger and immediately came back for readmission.  Complains of foot pain and nausea.  Denies fever/chills, chest pain, shortness of breath, abdominal pain, vomiting or dizziness.     Past Medical History   Past Medical History:  Diagnosis Date   Anemia    Diabetes mellitus without complication (HCC)    Type 1 DM   Essential hypertension    Headache    Hypertension 03/04/2013   Neurologic disorder    Both feet   Neuromuscular disorder (HCC)    Recurrent UTI    Renal disorder      Active Problem List   Patient Active Problem List   Diagnosis Date Noted   Left foot pain 07/07/2023   Severe sepsis (HCC) 07/07/2023   Diabetic foot infection (HCC) 06/18/2023   Chronic painful diabetic neuropathy (HCC) 04/22/2023   Neuropathic pain of foot, left 04/22/2023   Neuropathy of foot, right 04/22/2023   Chronic pain of left knee 04/22/2023   Hyperglycemia 04/07/2023   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 (HCC) 12/11/2022   Osteomyelitis of left foot (HCC) 11/25/2022   Hyponatremia 11/22/2022   Anemia of chronic disease 11/21/2022   Anasarca 11/21/2022   Acute osteomyelitis (HCC) 11/20/2022   Hypokalemia 11/20/2022   Acute idiopathic pericarditis 08/31/2022   Diastolic dysfunction 08/31/2022   PD catheter dysfunction (HCC)    Umbilical hernia without obstruction and without gangrene    CKD (chronic kidney disease) stage V  requiring chronic dialysis (HCC) 05/22/2022   ESRD on dialysis (HCC) 04/22/2022   Vomiting 04/19/2022   Acute kidney injury (HCC) 12/26/2021   Cesarean delivery delivered 02/26/2020   Pre-eclampsia, severe, third trimester 02/15/2020   Labor and delivery indication for care or intervention 02/13/2020   Chronic hypertension affecting pregnancy 11/08/2019   Anemia due to chronic kidney disease 11/08/2019   Pyelonephritis affecting pregnancy in first trimester 11/01/2019   Supervision of high risk pregnancy, antepartum 11/01/2019   Depression 10/27/2019   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) 10/27/2019   History of chlamydia infection 10/27/2019   History of depression 10/27/2019   Vitamin D deficiency 10/27/2019   Liver function abnormality 08/15/2018   Microalbuminuric diabetic nephropathy (HCC) 08/15/2018   High anion gap metabolic acidosis    AKI (acute kidney injury) (HCC)    Abdominal pain    Pyelonephritis 07/30/2016   Diabetic ketoacidosis without coma associated with type 1 diabetes mellitus (HCC) 09/09/2015   History of gonorrhea 08/13/2015   Intrauterine device 05/03/2015   Neuropathy 04/13/2013   Weight loss, non-intentional 04/13/2013   Depression, major, recurrent, moderate (HCC) 03/06/2013   Hypertension 03/04/2013     Past Surgical History   Past Surgical History:  Procedure Laterality Date   abscess removal     excision of bartholin cyst   AMPUTATION Left 11/23/2022   Procedure: LEFT GREAT TOE PARTIAL  RAY AMPUTATION;  Surgeon: Linus Galas, DPM;  Location: [facility];  Service: Podiatry;  Laterality: Left;   CAPD INSERTION N/A 06/09/2022   Procedure: LAPAROSCOPIC INSERTION CONTINUOUS AMBULATORY PERITONEAL DIALYSIS  (CAPD) CATHETER, PD rep to be present;  Surgeon: Henrene Dodge, MD;  Location: [facility];  Service: General;  Laterality: N/A;   CAPD INSERTION N/A 12/15/2022   Procedure: LAPAROSCOPIC INSERTION CONTINUOUS AMBULATORY PERITONEAL  DIALYSIS  (CAPD) CATHETER;  Surgeon: Henrene Dodge, MD;  Location: [facility];  Service: General;  Laterality: N/A;   CAPD REMOVAL N/A 12/15/2022   Procedure: LAPAROSCOPIC REMOVAL CONTINUOUS AMBULATORY PERITONEAL DIALYSIS  (CAPD) CATHETER, removal of old catheter;  Surgeon: Henrene Dodge, MD;  Location: [facility];  Service: General;  Laterality: N/A;   CAPD REMOVAL N/A 01/08/2023   Procedure: LAPAROSCOPIC REMOVAL CONTINUOUS AMBULATORY PERITONEAL DIALYSIS  (CAPD) CATHETER;  Surgeon: Henrene Dodge, MD;  Location: [facility];  Service: General;  Laterality: N/A;   CAPD REVISION N/A 07/16/2022   Procedure: LAPAROSCOPIC REVISION CONTINUOUS AMBULATORY PERITONEAL DIALYSIS  (CAPD) CATHETER;  Surgeon: Henrene Dodge, MD;  Location: [facility];  Service: General;  Laterality: N/A;   CAPD REVISION N/A 11/24/2022   Procedure: LAPAROSCOPIC REVISION CONTINUOUS AMBULATORY PERITONEAL DIALYSIS  (CAPD) CATHETER;  Surgeon: Henrene Dodge, MD;  Location: [facility];  Service: General;  Laterality: N/A;   DIALYSIS/PERMA CATHETER INSERTION N/A 04/24/2022   Procedure: DIALYSIS/PERMA CATHETER INSERTION;  Surgeon: Renford Dills, MD;  Location: [facility];  Service: Cardiovascular;  Laterality: N/A;   DIALYSIS/PERMA CATHETER REMOVAL N/A 06/02/2022   Procedure: DIALYSIS/PERMA CATHETER REMOVAL;  Surgeon: Renford Dills, MD;  Location: [facility];  Service: Cardiovascular;  Laterality: N/A;   DIALYSIS/PERMA CATHETER REMOVAL N/A 12/28/2022   Procedure: DIALYSIS/PERMA CATHETER REMOVAL;  Surgeon: Annice Needy, MD;  Location: [facility];  Service: Cardiovascular;  Laterality: N/A;   INSERTION OF DIALYSIS CATHETER Right    ARM   IR FLUORO GUIDE CV LINE RIGHT  11/27/2022   IR US GUIDE VASC ACCESS RIGHT  11/27/2022   IRRIGATION AND DEBRIDEMENT FOOT Left 06/19/2023   Procedure: IRRIGATION AND DEBRIDEMENT FOOT PARTIAL 1ST RAY AMPUTATION;  Surgeon: Rosetta Posner, DPM;  Location: [facility];  Service:  Orthopedics/Podiatry;  Laterality: Left;   Nexplanon  01/2011   UMBILICAL HERNIA REPAIR N/A 06/09/2022   Procedure: HERNIA REPAIR UMBILICAL ADULT;  Surgeon: Henrene Dodge, MD;  Location: [facility];  Service: General;  Laterality: N/A;     Home Medications   Prior to Admission medications   Medication Sig Start Date End Date Taking? Authorizing Provider  acetaminophen (TYLENOL) 500 MG tablet Take 2 tablets (1,000 mg total) by mouth every 6 (six) hours as needed for mild pain. 01/08/23   Henrene Dodge, MD  amLODipine (NORVASC) 10 MG tablet Take 1 tablet (10 mg total) by mouth daily. 04/28/22   Lurene Shadow, MD  atorvastatin (LIPITOR) 80 MG tablet Take 80 mg by mouth daily.    [provider]  bumetanide (BUMEX) 1 MG tablet Take 1 mg by mouth 2 (two) times daily.    [provider]  furosemide (LASIX) 80 MG tablet Take 80 mg by mouth daily.    [provider]  HUMALOG 100 UNIT/ML injection Inject 0-1 mLs (0-100 Units total) into the skin daily. Uses with Insulin Pump 04/09/23   Kathlen Mody, MD  losartan (COZAAR) 100 MG tablet Take 100 mg by mouth daily.    [provider]  montelukast (SINGULAIR) 10 MG tablet Take 10 mg  by mouth at bedtime.    [provider]  multivitamin (RENA-VIT) TABS tablet Take 1 tablet by mouth at bedtime. 06/21/23 09/19/23  Gillis Santa, MD  norethindrone-ethinyl estradiol (LOESTRIN) 1-20 MG-MCG tablet Take 1 tablet by mouth daily.    [provider]  ondansetron (ZOFRAN) 8 MG tablet Take 8 mg by mouth daily.    [provider]  oxyCODONE-acetaminophen (PERCOCET/ROXICET) 5-325 MG tablet Take 1 tablet by mouth every 6 (six) hours as needed. 06/30/23   [provider]  sevelamer carbonate (RENVELA) 800 MG tablet Take 800-1,600 mg by mouth See admin instructions. Take 2 tablets (1600mg ) by mouth three times daily with meals and take 1 tablet (800mg ) by mouth daily with snacks    [provider]   SUMAtriptan (IMITREX) 50 MG tablet Take 50 mg by mouth daily as needed for migraine.    [provider]  Vitamin D, Ergocalciferol, (DRISDOL) 1.25 MG (50000 UNIT) CAPS capsule Take 1 capsule (50,000 Units total) by mouth every 7 (seven) days. 06/27/23 09/25/23  Gillis Santa, MD     Allergies  [name].   Family History   Family History  Problem Relation Age of Onset   Breast cancer Mother 8   Lung cancer Maternal Grandmother    Diabetes type II Paternal Grandmother      Physical Exam  Triage Vital Signs: ED Triage Vitals  Encounter Vitals Group     BP 07/09/23 2146 (!) 181/109     Systolic BP Percentile --      Diastolic BP Percentile --      Pulse Rate 07/09/23 2146 96     Resp 07/09/23 2146 16     Temp 07/09/23 2146 97.9 F (36.6 C)     Temp Source 07/09/23 2146 Oral     SpO2 07/09/23 2146 98 %     Weight 07/09/23 2145 149 lb 14.6 oz (68 kg)     Height 07/09/23 2145 5\' 6"  (1.676 m)     Head Circumference --      Peak Flow --      Pain Score 07/09/23 2145 10     Pain Loc --      Pain Education --      Exclude from Growth Chart --     Updated Vital Signs: BP (!) 181/109 (BP Location: Right Arm)   Pulse 96   Temp 97.9 F (36.6 C) (Oral)   Resp 16   Ht 5\' 6"  (1.676 m)   Wt 68 kg   LMP 06/14/2023   SpO2 98%   BMI 24.20 kg/m    General: Awake, mild distress.  CV:  RRR.  Good peripheral perfusion.  Resp:  Normal effort.  CTAB. Abd:  Nontender.  No distention.  Other:  Left foot wrapped in fresh bandages.  POD 0; bandages left intact.   ED Results / Procedures / Treatments  Labs (all labs ordered are listed, but only abnormal results are displayed) Labs Reviewed  COMPREHENSIVE METABOLIC PANEL - Abnormal; Notable for the following components:      Result Value   Sodium 129 (*)    Chloride 90 (*)    CO2 14 (*)    Glucose, Bld 497 (*)    BUN 49 (*)    Creatinine, Ser 7.70 (*)    Calcium 7.4 (*)    Albumin 2.9 (*)     Total Bilirubin 1.9 (*)    GFR, Estimated 7 (*)    Anion gap  25 (*)    All other components within normal limits  CBC WITH DIFFERENTIAL/PLATELET - Abnormal; Notable for the following components:   WBC 12.6 (*)    RBC 3.20 (*)    Hemoglobin 9.3 (*)    HCT 29.7 (*)    Neutro Abs 12.1 (*)    Lymphs Abs 0.4 (*)    All other components within normal limits  BETA-HYDROXYBUTYRIC ACID  BLOOD GAS, VENOUS     EKG  ED ECG REPORT I, Baran Kuhrt J, the attending physician, personally viewed and interpreted this ECG.   Date: 07/10/2023  EKG Time: 0257  Rate: 96  Rhythm: normal sinus rhythm  Axis: Normal  Intervals: QTc 527  ST&T Change: Nonspecific    RADIOLOGY None   Official radiology report(s): DG Foot Complete Left  Result Date: 07/09/2023 CLINICAL DATA:  Postop amputation EXAM: LEFT FOOT - COMPLETE 3+ VIEW COMPARISON:  07/07/2023 FINDINGS: Interval amputation of the remnant base of first metatarsal as well as the distal medial cuneiform. Air in the soft tissues consistent with interval surgery. Chronic AVN of the second metatarsal. IMPRESSION: Interval amputation of the remnant base of first metatarsal and distal medial cuneiform. Electronically Signed   By: Jasmine Pang M.D.   On: 07/09/2023 20:45   DG MINI C-ARM IMAGE ONLY  Result Date: 07/09/2023 There is no interpretation for this exam.  This order is for images obtained during a surgical procedure.  Please See "Surgeries" Tab for more information regarding the procedure.     PROCEDURES:  Critical Care performed: Yes, see critical care procedure note(s)  CRITICAL CARE Performed by: Irean Hong   Total critical care time: 45 laboratory results demonstrate DKA with anion gap 25, pseudohyponatremia, mild leukocytosis.  Will place on Endo tool.  Will consult hospital services for evaluation and admission.  Laboratory results demonstrate minutes  Critical care time was exclusive of separately billable procedures and  treating other patients.  Critical care was necessary to treat or prevent imminent or life-threatening deterioration.  Critical care was time spent personally by me on the following activities: development of treatment plan with patient and/or surrogate as well as nursing, discussions with consultants, evaluation of patient's response to treatment, examination of patient, obtaining history from patient or surrogate, ordering and performing treatments and interventions, ordering and review of laboratory studies, ordering and review of radiographic studies, pulse oximetry and re-evaluation of patient's condition.   Marland Kitchen1-3 Lead EKG Interpretation  Performed by: Irean Hong, MD Authorized by: Irean Hong, MD     ECG rate:  95   ECG rate assessment: normal     Rhythm: sinus rhythm     Ectopy: none     Conduction: normal   Comments:     Patient placed on cardiac monitor to evaluate for arrhythmias    MEDICATIONS ORDERED IN ED: Medications  insulin regular, human (MYXREDLIN) 100 units/ 100 mL infusion (has no administration in time range)  dextrose 50 % solution 0-50 mL (has no administration in time range)  0.9 %  sodium chloride infusion (has no administration in time range)  dextrose 5 % and 0.45 % NaCl infusion (has no administration in time range)  sodium chloride 0.9 % bolus 1,360 mL (has no administration in time range)  sodium chloride 0.9 % bolus 1,000 mL (1,000 mLs Intravenous New Bag/Given 07/10/23 0126)  ondansetron (ZOFRAN) injection 4 mg (4 mg Intravenous Given 07/10/23 0129)  morphine (PF) 4 MG/ML injection 4 mg (4 mg Intravenous Given 07/10/23 0130)  ceFEPIme (MAXIPIME) 2 g in sodium chloride 0.9 % 100 mL IVPB (0 g Intravenous Stopped 07/10/23 0216)  HYDROmorphone (DILAUDID) injection 1 mg (1 mg Intravenous Given 07/10/23 0216)     IMPRESSION / MDM / ASSESSMENT AND PLAN / ED COURSE  I reviewed the triage vital signs and the nursing notes.                              27 year old female who returns just hours after leaving AMA for DKA, left foot osteomyelitis status post fresh debridement POD 0.  Will repeat lab work, anticipate readmission to the hospital.  I personally reviewed patient's records and note her recent hospital course.  Patient's presentation is most consistent with acute presentation with potential threat to life or bodily function.  The patient is on the cardiac monitor to evaluate for evidence of arrhythmia and/or significant heart rate changes.  1191 Laboratory results demonstrate DKA with AG 25, pseudohyponatremia. Will start Endotool. Will consult hospitalist services for evaluation and admission.  FINAL CLINICAL IMPRESSION(S) / ED DIAGNOSES   Final diagnoses:  Osteomyelitis of left foot, unspecified type (HCC)  Diabetic ketoacidosis without coma associated with type 1 diabetes mellitus (HCC)  Pseudohyponatremia  ESRD on hemodialysis (HCC)     Rx / DC Orders   ED Discharge Orders     None        Note:  This document was prepared using Dragon voice recognition software and may include unintentional dictation errors.   Irean Hong, MD 07/10/23 [phone]

## 2023-07-10 NOTE — Hospital Course (Addendum)
Taken from H&P.  Emma Thomas is a 27 y.o. female with medical history significant for ESRD on home hemodialysis X 4 days, type 1 diabetes mellitus, hypertension, dyslipidemia, HFpEF and anemia of chronic disease, who was just admitted here for left foot osteomyelitis and underwent debridement by Dr. Excell Seltzer.  She was being managed in stepdown for DKA and left AMA yesterday.  Came back overnight with mild nausea and left foot pain.  On representation to ER, blood pressure was 181/109, pseudohyponatremia secondary to hyperglycemia, CO2 was 12 and anion gap of 22, BUN 49, creatinine 7.7.VBG showed pH 7.16 and bicarbonate of 8.6. Total bili was 1.9 with albumin 2.9 otherwise unremarkable LFTs. CBC showed leukocytosis 12.6 with neutrophilia and anemia better than her previous levels.   Patient was again started on DKA protocol with Endo tool along with IV antibiotics with cefepime and vancomycin.  8/24: CBG improving.  Recent wound cultures from debridement remain negative, prior wound cultures on 8/3 with Streptococcus mitis/oralis which were only sensitive to vancomycin.  Starting on diet at her request to prevent another AMA, gap still open.  8/25: DKA resolved and patient is now on basal and short-acting insulin.  Packing was removed by podiatry yesterday.  Cultures remain negative.  Podiatry wants to continue with IV antibiotics and involve ID based on her prior cultures.  They will change another dressing tomorrow before discharge.  8/26: Cultures started growing rare similar bacteria as prior culture.  Patient will need IV vancomycin due to the resistance and Flagyl per ID.  Nephrology is trying to arrange outpatient dialysis so IV vancomycin can be given during that time.  Currently doing home hemodialysis.  8/27: Medically stable.  Patient will continue with vancomycin for at least for 4 more weeks during dialysis, she will take Flagyl until 08/05/2023 as recommended by ID.  ID will contact the  dialysis if need to make changes to the duration of antibiotics based on bone culture results which are still pending.  Patient will continue on current medications need to have close follow-up with her providers for further recommendations.

## 2023-07-10 NOTE — Progress Notes (Signed)
Anticoagulation monitoring(Lovenox):  27 yo female ordered Lovenox 40 mg Q24h    Filed Weights   07/09/23 2145  Weight: 68 kg (149 lb 14.6 oz)   BMI 24.2    Lab Results  Component Value Date   CREATININE 7.70 (H) 07/10/2023   CREATININE 7.27 (H) 07/09/2023   CREATININE 7.17 (H) 07/09/2023   Estimated Creatinine Clearance: 10.2 mL/min (A) (by C-G formula based on SCr of 7.7 mg/dL (H)). Hemoglobin & Hematocrit     Component Value Date/Time   HGB 9.3 (L) 07/10/2023 0123   HGB 8.7 (L) 10/25/2019 1022   HCT 29.7 (L) 07/10/2023 0123   HCT 26.0 (L) 10/25/2019 1022     Per Protocol for Patient with estCrcl < 15 ml/min and BMI < 30, will transition to Heparin 5000 units SQ Q8H.

## 2023-07-10 NOTE — Assessment & Plan Note (Deleted)
-  We will continue her antihypertensive therapy 

## 2023-07-10 NOTE — Progress Notes (Signed)
[facility]  ROUNDING NOTE   Subjective:   Emma Thomas admitted to [facility] after leaving AMA last night so she could eat a burger.   Patient readmitted for DKA (diabetic ketoacidosis) (HCC) [E11.10] ESRD on hemodialysis (HCC) [N18.6, Z99.2] Diabetic ketoacidosis without coma associated with type 1 diabetes mellitus (HCC) [E10.10] Osteomyelitis of left foot, unspecified type (HCC) [M86.9] Pseudohyponatremia [R79.89]  Placed on a insulin gtt.   Complains of pain over her left foot. I&D by Dr. Excell Seltzer of left foot abscess yesterday (8/23)   Objective:  Vital signs in last 24 hours:  Temp:  [97.4 F (36.3 C)-98.8 F (37.1 C)] 98.8 F (37.1 C) (08/24 0800) Pulse Rate:  [84-100] 100 (08/24 0830) Resp:  [10-25] 12 (08/24 0830) BP: (124-181)/(75-109) 168/108 (08/24 0830) SpO2:  [91 %-100 %] 100 % (08/24 0830) Weight:  [68 kg-78.1 kg] 78.1 kg (08/24 0800)  Weight change:  Filed Weights   07/09/23 2145 07/10/23 0800  Weight: 68 kg 78.1 kg    Intake/Output: No intake/output data recorded.   Intake/Output this shift:  Total I/O In: 433.8 [I.V.:433.8] Out: -   Physical Exam: General: NAD  Head: Normocephalic, atraumatic. Moist oral mucosal membranes  Eyes: Anicteric  Lungs:  Clear to auscultation  Heart: Regular rate and rhythm  Abdomen:  Soft, nontender  Extremities:  no peripheral edema. Left foot in dressings, clean and dry  Neurologic: Nonfocal, moving all four extremities  Skin: No lesions  Access: Rt AVF    Basic Metabolic Panel: Recent Labs  Lab 07/08/23 1632 07/08/23 2055 07/09/23 1043 07/09/23 1452 07/09/23 1904 07/10/23 0123 07/10/23 0456  NA 132*   < > 134* 134* 133* 129* 129*  K 4.3   < > 4.2 3.8 3.7 5.0 4.1  CL 92*   < > 95* 97* 94* 90* 95*  CO2 14*   < > 22 22 21* 14* 12*  GLUCOSE 344*   < > 176* 174* 183* 497* 470*  BUN 37*   < > 44* 42* 43* 49* 51*  CREATININE 6.00*   < > 7.21* 7.17* 7.27* 7.70* 7.62*  CALCIUM 7.3*   < > 7.1*  7.4* 7.6* 7.4* 6.6*  PHOS 6.5*  --   --   --   --   --   --    < > = values in this interval not displayed.    Liver Function Tests: Recent Labs  Lab 07/07/23 1348 07/08/23 1632 07/10/23 0123  AST 22  --  18  ALT 14  --  16  ALKPHOS 65  --  70  BILITOT 1.5*  --  1.9*  PROT 7.1  --  7.6  ALBUMIN 2.7* 2.4* 2.9*   No results for input(s): "LIPASE", "AMYLASE" in the last 168 hours. No results for input(s): "AMMONIA" in the last 168 hours.  CBC: Recent Labs  Lab 07/07/23 1210 07/08/23 0622 07/08/23 2055 07/09/23 0431 07/10/23 0123  WBC 3.7* 22.3* 16.9* 14.3* 12.6*  NEUTROABS 3.3  --   --  11.2* 12.1*  HGB 8.2* 6.5* 6.8* 7.9* 9.3*  HCT 25.2* 20.2* 19.6* 23.2* 29.7*  MCV 91.6 94.0 87.5 86.9 92.8  PLT 304 247 247 269 331    Cardiac Enzymes: No results for input(s): "CKTOTAL", "CKMB", "CKMBINDEX", "TROPONINI" in the last 168 hours.  BNP: Invalid input(s): "POCBNP"  CBG: Recent Labs  Lab 07/10/23 0524 07/10/23 0559 07/10/23 0721 07/10/23 0813 07/10/23 0911  GLUCAP 412* 375* 324* 288* 250*    Microbiology: Results for  orders placed or performed during the hospital encounter of 07/07/23  Blood Culture (routine x 2)     Status: None (Preliminary result)   Collection Time: 07/07/23 12:10 PM   Specimen: BLOOD  Result Value Ref Range Status   Specimen Description BLOOD BLOOD LEFT ARM  Final   Special Requests   Final    BOTTLES DRAWN AEROBIC AND ANAEROBIC Blood Culture adequate volume   Culture   Final    NO GROWTH 3 DAYS Performed at [facility], [address], [place], [place] [address]    Report Status PENDING  Incomplete  SARS Coronavirus 2 by RT PCR (hospital order, performed in [facility] hospital lab) *cepheid single result test* Anterior Nasal Swab     Status: None   Collection Time: 07/07/23 12:10 PM   Specimen: Anterior Nasal Swab  Result Value Ref Range Status   SARS Coronavirus 2 by RT PCR NEGATIVE NEGATIVE Final    Comment:  (NOTE) SARS-CoV-2 target nucleic acids are NOT DETECTED.  The SARS-CoV-2 RNA is generally detectable in upper and lower respiratory specimens during the acute phase of infection. The lowest concentration of SARS-CoV-2 viral copies this assay can detect is 250 copies / mL. A negative result does not preclude SARS-CoV-2 infection and should not be used as the sole basis for treatment or other patient management decisions.  A negative result may occur with improper specimen collection / handling, submission of specimen other than nasopharyngeal swab, presence of viral mutation(s) within the areas targeted by this assay, and inadequate number of viral copies (<250 copies / mL). A negative result must be combined with clinical observations, patient history, and epidemiological information.  Fact Sheet for Patients:   RoadLapTop.co.za  Fact Sheet for Healthcare Providers: http://kim-miller.com/  This test is not yet approved or  cleared by the [place] FDA and has been authorized for detection and/or diagnosis of SARS-CoV-2 by FDA under an Emergency Use Authorization (EUA).  This EUA will remain in effect (meaning this test can be used) for the duration of the COVID-19 declaration under Section 564(b)(1) of the Act, 21 U.S.C. section 360bbb-3(b)(1), unless the authorization is terminated or revoked sooner.  Performed at [facility], [address] [place], [place] [address]   Culture, blood (Routine X 2) w Reflex to ID Panel     Status: None (Preliminary result)   Collection Time: 07/07/23 10:46 PM   Specimen: BLOOD  Result Value Ref Range Status   Specimen Description BLOOD  LAC  Final   Special Requests   Final    BOTTLES DRAWN AEROBIC AND ANAEROBIC Blood Culture adequate volume   Culture   Final    NO GROWTH 3 DAYS Performed at [facility], [address] [place], [place] [address]    Report Status  PENDING  Incomplete  MRSA Next Gen by PCR, Nasal     Status: None   Collection Time: 07/07/23 11:50 PM   Specimen: Nasal Mucosa; Nasal Swab  Result Value Ref Range Status   MRSA by PCR Next Gen NOT DETECTED NOT DETECTED Final    Comment: (NOTE) The GeneXpert MRSA Assay (FDA approved for NASAL specimens only), is one component of a comprehensive MRSA colonization surveillance program. It is not intended to diagnose MRSA infection nor to guide or monitor treatment for MRSA infections. Test performance is not FDA approved in patients less than 64 years old. Performed at [facility], [address], [place], [place] [address]   Aerobic/Anaerobic  Culture w Gram Stain (surgical/deep wound)     Status: None (Preliminary result)   Collection Time: 07/09/23  5:01 PM   Specimen: Foot, Left; Wound  Result Value Ref Range Status   Specimen Description WOUND FOOT  Final   Special Requests SWAB SAMPLE A  Final   Gram Stain   Final    FEW WBC PRESENT, PREDOMINANTLY PMN NO ORGANISMS SEEN Performed at [facility], [address] [place], [place] [address]    Culture PENDING  Incomplete   Report Status PENDING  Incomplete  Aerobic/Anaerobic Culture w Gram Stain (surgical/deep wound)     Status: None (Preliminary result)   Collection Time: 07/09/23  5:17 PM   Specimen: Foot, Left; Wound  Result Value Ref Range Status   Specimen Description BONE LEFT FOOT  Final   Special Requests 1ST METATARSAL SAMPLE B  Final   Gram Stain   Final    NO WBC SEEN NO ORGANISMS SEEN Performed at [facility], [address], [place], [place] [address]    Culture PENDING  Incomplete   Report Status PENDING  Incomplete  Aerobic/Anaerobic Culture w Gram Stain (surgical/deep wound)     Status: None (Preliminary result)   Collection Time: 07/09/23  5:22 PM   Specimen: Foot, Left; Wound  Result Value Ref Range Status   Specimen Description BONE LEFT FOOT 1ST CUNEIFORM  Final   Special  Requests NONE  Final   Gram Stain   Final    NO WBC SEEN NO ORGANISMS SEEN Performed at [facility], [address], [place], [place] [address]    Culture PENDING  Incomplete   Report Status PENDING  Incomplete    Coagulation Studies: Recent Labs    07/07/23 1348  LABPROT 16.4*  INR 1.3*    Urinalysis: Recent Labs    07/07/23 1547  COLORURINE YELLOW*  LABSPEC 1.011  PHURINE 6.0  GLUCOSEU >=500*  HGBUR SMALL*  BILIRUBINUR NEGATIVE  KETONESUR 20*  PROTEINUR >=300*  NITRITE NEGATIVE  LEUKOCYTESUR NEGATIVE      Imaging: DG Foot Complete Left  Result Date: 07/09/2023 CLINICAL DATA:  Postop amputation EXAM: LEFT FOOT - COMPLETE 3+ VIEW COMPARISON:  07/07/2023 FINDINGS: Interval amputation of the remnant base of first metatarsal as well as the distal medial cuneiform. Air in the soft tissues consistent with interval surgery. Chronic AVN of the second metatarsal. IMPRESSION: Interval amputation of the remnant base of first metatarsal and distal medial cuneiform. Electronically Signed   By: Jasmine Pang M.D.   On: 07/09/2023 20:45   DG MINI C-ARM IMAGE ONLY  Result Date: 07/09/2023 There is no interpretation for this exam.  This order is for images obtained during a surgical procedure.  Please See "Surgeries" Tab for more information regarding the procedure.     Medications:    ceFEPime (MAXIPIME) IV     dextrose 5 % and 0.45 % NaCl 125 mL/hr at 07/10/23 0918   insulin 8.5 Units/hr (07/10/23 0830)   vancomycin      amLODipine  10 mg Oral Daily   atorvastatin  80 mg Oral Daily   bumetanide  1 mg Oral BID   heparin injection (subcutaneous)  5,000 Units Subcutaneous Q8H   losartan  100 mg Oral Daily   montelukast  10 mg Oral QHS   multivitamin  1 tablet Oral QHS   norethindrone-ethinyl estradiol  1 tablet Oral Daily   sevelamer carbonate  1,600 mg Oral TID with meals   [  START ON 07/12/2023] Vitamin D (Ergocalciferol)  50,000 Units Oral Q7 days   acetaminophen  **OR** acetaminophen, dextrose, dextrose, magnesium hydroxide, ondansetron **OR** ondansetron (ZOFRAN) IV, oxyCODONE-acetaminophen, sevelamer carbonate, SUMAtriptan, traZODone  Assessment/ Plan:  Emma Thomas is a 27 y.o.  female with past medical history of diabetes, anemia, and diabetes type 1, and end stage renal disease on peritoneal dialysis. Patient presents to the ED with fever and body aches. She has been admitted for DKA (diabetic ketoacidosis) (HCC) [E11.10] ESRD on hemodialysis (HCC) [N18.6, Z99.2] Diabetic ketoacidosis without coma associated with type 1 diabetes mellitus (HCC) [E10.10] Osteomyelitis of left foot, unspecified type (HCC) [M86.9] Pseudohyponatremia [R79.89]  End stage renal disease: on home hemodialysis  - Dialysis for tomorrow.   2. Anemia of chronic kidney disease Lab Results  Component Value Date   HGB 9.3 (L) 07/10/2023    - ESA scheduled  3. Secondary Hyperparathyroidism: with hypocalcemia and hyperphosphatemia.    Lab Results  Component Value Date   CALCIUM 6.6 (L) 07/10/2023   CAION 0.97 (L) 01/08/2023   PHOS 6.5 (H) 07/08/2023    - Sevelamer with meals.   4. Diabetes mellitus type II with chronic kidney disease: insulin dependent. Most recent hemoglobin A1c is 8.5 on 06/18/23.   - with DKA on admission  - continue insulin gtt.   - discuss with primary team: patient to get food even though her anion gap has no closed.   5. Sepsis secondary to left foot abscess. 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 I&D on 8/23 for abscess.   - empiric cefepime and vancomycin  6. Hypertension: urgency on admission. Now at 145/93.   - losartan and amlodipine  - hold bumetanide.    LOS: 0 Daundre Biel 8/24/202410:10 AM

## 2023-07-10 NOTE — Progress Notes (Addendum)
Pharmacy Antibiotic Note  Emma Thomas is a 27 y.o. female admitted on 07/10/2023 with  osteomyelitis .  Pharmacy has been consulted for Vancomycin dosing.  Plan: Vancomycin 1500 mg IV X 1 loading dose ordered for 8/24 @ ~ 0800.  Vancomycin 750 mg IV Q Tue-Th-Sat -HD ordered to start on 8/24 @ 1200.  - no trough currently ordered   Height: 5\' 6"  (167.6 cm) Weight: 68 kg (149 lb 14.6 oz) IBW/kg (Calculated) : 59.3  Temp (24hrs), Avg:97.7 F (36.5 C), Min:97.1 F (36.2 C), Max:98.1 F (36.7 C)  Recent Labs  Lab 07/07/23 1210 07/07/23 1348 07/07/23 1547 07/08/23 0622 07/08/23 1048 07/08/23 2055 07/09/23 0431 07/09/23 1043 07/09/23 1452 07/09/23 1904 07/10/23 0123 07/10/23 0456  WBC 3.7*  --   --  22.3*  --  16.9* 14.3*  --   --   --  12.6*  --   CREATININE  --    < >  --  10.86*   < > 6.68* 6.57* 7.21* 7.17* 7.27* 7.70* 7.62*  LATICACIDVEN 4.5*  --  1.3  --   --   --   --   --   --   --   --   --    < > = values in this interval not displayed.    Estimated Creatinine Clearance: 10.3 mL/min (A) (by C-G formula based on SCr of 7.62 mg/dL (H)).    No Known Allergies  Antimicrobials this admission:   >>    >>   Dose adjustments this admission:   Microbiology results:  BCx:   UCx:    Sputum:    MRSA PCR:   Thank you for allowing pharmacy to be a part of this patient's care.  Emma Thomas D 07/10/2023 7:12 AM

## 2023-07-10 NOTE — Progress Notes (Signed)
Progress Note   Patient: Emma Thomas:[id number] DOB: Dec 01, 1995 DOA: 07/10/2023     0 DOS: the patient was seen and examined on 07/10/2023   Brief hospital course: Taken from H&P.  Emma Thomas is a 27 y.o. female with medical history significant for ESRD on home hemodialysis X 4 days, type 1 diabetes mellitus, hypertension, dyslipidemia, HFpEF and anemia of chronic disease, who was just admitted here for left foot osteomyelitis and underwent debridement by Dr. Excell Seltzer.  She was being managed in stepdown for DKA and left AMA yesterday.  Came back overnight with mild nausea and left foot pain.  On representation to ER, blood pressure was 181/109, pseudohyponatremia secondary to hyperglycemia, CO2 was 12 and anion gap of 22, BUN 49, creatinine 7.7.VBG showed pH 7.16 and bicarbonate of 8.6. Total bili was 1.9 with albumin 2.9 otherwise unremarkable LFTs. CBC showed leukocytosis 12.6 with neutrophilia and anemia better than her previous levels.   Patient was again started on DKA protocol with Endo tool along with IV antibiotics with cefepime and vancomycin.  8/24: CBG improving.  Recent wound cultures from debridement remain negative, prior wound cultures on 8/3 with Streptococcus mitis/oralis which were only sensitive to vancomycin.  Starting on diet at her request to prevent another AMA, gap still open.    Assessment and Plan: * DKA (diabetic ketoacidosis) (HCC) Gap remained open, CBG improving, remained on Endo tool.  Patient wants to start on diet, she left AMA yesterday to get a burger?? -Starting on diet to prevent another AMA while she remained in DKA. -Continue with Endo tool -Will switch to basal and short-acting once gap closed x 2    Osteomyelitis of left foot (HCC) - Status post left foot debridement.  Preliminary cultures with no growth Patient did not met sepsis criteria-sepsis ruled out. - We will continue IV cefepime and vancomycin. -Follow-up podiatry  recommendations. - Continue with pain management  End-stage renal disease on hemodialysis [facility]) Patient was doing hemodialysis at home, nephrology was consulted and patient will received dialysis while in hospital. - Continue Renvela. -Nephrology holding Bumex  Hypertensive urgency Blood pressure improving - Continue with home antihypertensives - We will place on as needed IV labetalol.  Dyslipidemia - We will continue statin therapy.  Migraine - We will continue her Imitrex.   Subjective: Patient is complaining of right foot pain and asking for Dilaudid when seen today.  She also on to eat, keeping her n.p.o. while on Endo tool was the reason for leaving AMA yesterday.  Physical Exam: Vitals:   07/10/23 0330 07/10/23 0500 07/10/23 0800 07/10/23 0830  BP: 130/75 138/83 (!) 168/108 (!) 168/108  Pulse: 96 95  100  Resp: (!) 25 (!) 24 12 12   Temp:   98.8 F (37.1 C)   TempSrc:   Oral   SpO2: 100% 98% 100% 100%  Weight:   78.1 kg   Height:       General.  Well-developed lady, in no acute distress. Pulmonary.  Lungs clear bilaterally, normal respiratory effort. CV.  Regular rate and rhythm, no JVD, rub or murmur. Abdomen.  Soft, nontender, nondistended, BS positive. CNS.  Alert and oriented .  No focal neurologic deficit. Extremities.  No edema, pulses intact , left foot with Ace wrap. Psychiatry.  Judgment and insight appears normal.   Data Reviewed: Prior data reviewed  Family Communication: Discussed with patient  Disposition: Status is: Inpatient Remains inpatient appropriate because: Severity of illness  Planned Discharge Destination: Home  DVT  prophylaxis.  Subcu heparin Time spent: 50 minutes  This record has been created using Conservation officer, historic buildings. Errors have been sought and corrected,but may not always be located. Such creation errors do not reflect on the standard of care.   Author: Arnetha Courser, MD 07/10/2023 11:51 AM  For on call  review [email or web address].

## 2023-07-10 NOTE — H&P (Signed)
[place]   PATIENT NAME: Emma Thomas    MR#:  [id number]  DATE OF BIRTH:  [birth year]  DATE OF ADMISSION:  07/10/2023  PRIMARY CARE PHYSICIAN: [place], [facility]   Patient is coming from: Home  REQUESTING/REFERRING PHYSICIAN: Chiquita Loth, MD CHIEF COMPLAINT:   Chief Complaint  Patient presents with   Wound Infection    Left Foot    HISTORY OF PRESENT ILLNESS:  Emma Thomas is a 27 y.o. female with medical history significant for ESRD on home hemodialysis X 4 days, type 1 diabetes mellitus, hypertension, dyslipidemia, HFpEF and anemia of chronic disease, who was just admitted here for left foot osteomyelitis and underwent debridement by Dr. Excell Seltzer.  She was being managed in stepdown for DKA and left AMA yesterday.  She stated that she was in addition to being hungry unsatisfied.  She admitted to nausea without vomiting.  She has been having mild dyspnea.  She has occasional left foot pain.  No fever or chills.  No cough or wheezing or dyspnea.  No chest pain or palpitations.  ED Course: When she came to the ER BP was 181/109 with otherwise normal vital signs.  Labs revealed mild hyponatremia 129 with hypochloremia of 90 with blood glucose of 497, CO2 of 12 and anion gap of 22 with BUN 49 creatinine 7.7and calcium 7.4.  VBG showed pH 7.16 and bicarbonate of 8.6.  Total bili was 1.9 with albumin 2.9 otherwise unremarkable LFTs.  CBC showed leukocytosis 12.6 with neutrophilia and anemia better than her previous levels.  EKG as reviewed by me : EKG showed sinus rhythm with a rate of 96 with prolonged QT interval with QTc of 527 MS. Imaging: None.  The patient was given IV cefepime, IV Dilaudid, IV morphine sulfate, a bolus of IV normal saline followed by maintenance infusion and 4 mg of IV Zofran.  She started on IV insulin drip per Endo tool.  She will be admitted to stepdown unit bed for further evaluation and management.  Good morning PAST MEDICAL HISTORY:   Past  Medical History:  Diagnosis Date   Anemia    Diabetes mellitus without complication (HCC)    Type 1 DM   Essential hypertension    Headache    Hypertension 03/04/2013   Neurologic disorder    Both feet   Neuromuscular disorder (HCC)    Recurrent UTI    Renal disorder     PAST SURGICAL HISTORY:   Past Surgical History:  Procedure Laterality Date   abscess removal     excision of bartholin cyst   AMPUTATION Left 11/23/2022   Procedure: LEFT GREAT TOE PARTIAL RAY AMPUTATION;  Surgeon: Linus Galas, DPM;  Location: [facility];  Service: Podiatry;  Laterality: Left;   CAPD INSERTION N/A 06/09/2022   Procedure: LAPAROSCOPIC INSERTION CONTINUOUS AMBULATORY PERITONEAL DIALYSIS  (CAPD) CATHETER, PD rep to be present;  Surgeon: Henrene Dodge, MD;  Location: [facility];  Service: General;  Laterality: N/A;   CAPD INSERTION N/A 12/15/2022   Procedure: LAPAROSCOPIC INSERTION CONTINUOUS AMBULATORY PERITONEAL DIALYSIS  (CAPD) CATHETER;  Surgeon: Henrene Dodge, MD;  Location: [facility];  Service: General;  Laterality: N/A;   CAPD REMOVAL N/A 12/15/2022   Procedure: LAPAROSCOPIC REMOVAL CONTINUOUS AMBULATORY PERITONEAL DIALYSIS  (CAPD) CATHETER, removal of old catheter;  Surgeon: Henrene Dodge, MD;  Location: [facility];  Service: General;  Laterality: N/A;   CAPD REMOVAL N/A 01/08/2023   Procedure: LAPAROSCOPIC REMOVAL CONTINUOUS AMBULATORY PERITONEAL DIALYSIS  (CAPD)  CATHETER;  Surgeon: Henrene Dodge, MD;  Location: [facility];  Service: General;  Laterality: N/A;   CAPD REVISION N/A 07/16/2022   Procedure: LAPAROSCOPIC REVISION CONTINUOUS AMBULATORY PERITONEAL DIALYSIS  (CAPD) CATHETER;  Surgeon: Henrene Dodge, MD;  Location: [facility];  Service: General;  Laterality: N/A;   CAPD REVISION N/A 11/24/2022   Procedure: LAPAROSCOPIC REVISION CONTINUOUS AMBULATORY PERITONEAL DIALYSIS  (CAPD) CATHETER;  Surgeon: Henrene Dodge, MD;  Location: [facility];  Service: General;  Laterality: N/A;   DIALYSIS/PERMA CATHETER  INSERTION N/A 04/24/2022   Procedure: DIALYSIS/PERMA CATHETER INSERTION;  Surgeon: Renford [place], MD;  Location: [facility];  Service: Cardiovascular;  Laterality: N/A;   DIALYSIS/PERMA CATHETER REMOVAL N/A 06/02/2022   Procedure: DIALYSIS/PERMA CATHETER REMOVAL;  Surgeon: Renford [place], MD;  Location: [facility];  Service: Cardiovascular;  Laterality: N/A;   DIALYSIS/PERMA CATHETER REMOVAL N/A 12/28/2022   Procedure: DIALYSIS/PERMA CATHETER REMOVAL;  Surgeon: Annice Needy, MD;  Location: [facility];  Service: Cardiovascular;  Laterality: N/A;   INSERTION OF DIALYSIS CATHETER Right    ARM   IR FLUORO GUIDE CV LINE RIGHT  11/27/2022   IR US GUIDE VASC ACCESS RIGHT  11/27/2022   IRRIGATION AND DEBRIDEMENT FOOT Left 06/19/2023   Procedure: IRRIGATION AND DEBRIDEMENT FOOT PARTIAL 1ST RAY AMPUTATION;  Surgeon: Rosetta Posner, DPM;  Location: [facility];  Service: Orthopedics/Podiatry;  Laterality: Left;   Nexplanon  01/2011   UMBILICAL HERNIA REPAIR N/A 06/09/2022   Procedure: HERNIA REPAIR UMBILICAL ADULT;  Surgeon: Henrene Dodge, MD;  Location: [facility];  Service: General;  Laterality: N/A;    SOCIAL HISTORY:   Social History   Tobacco Use   Smoking status: Never    Passive exposure: Past   Smokeless tobacco: Never  Substance Use Topics   Alcohol use: Not Currently    Alcohol/week: 2.0 standard drinks of alcohol    Types: 2 Shots of liquor per week    FAMILY HISTORY:   Family History  Problem Relation Age of Onset   Breast cancer Mother 7   Lung cancer Maternal Grandmother    Diabetes type II Paternal Grandmother     DRUG ALLERGIES:  No Known Allergies  REVIEW OF SYSTEMS:   ROS As per history of present illness. All pertinent systems were reviewed above. Constitutional, HEENT, cardiovascular, respiratory, GI, GU, musculoskeletal, neuro, psychiatric, endocrine, integumentary and hematologic systems were reviewed and are otherwise  negative/unremarkable except for positive findings mentioned above in the HPI.   MEDICATIONS AT HOME:   Prior to Admission medications   Medication Sig Start Date End Date Taking? Authorizing Provider  amLODipine (NORVASC) 10 MG tablet Take 1 tablet (10 mg total) by mouth daily. 04/28/22  Yes Lurene Shadow, MD  atorvastatin (LIPITOR) 80 MG tablet Take 80 mg by mouth daily.   Yes [provider]  bumetanide (BUMEX) 1 MG tablet Take 1 mg by mouth 2 (two) times daily.   Yes [provider]  furosemide (LASIX) 80 MG tablet Take 80 mg by mouth daily.   Yes [provider]  HUMALOG 100 UNIT/ML injection Inject 0-1 mLs (0-100 Units total) into the skin daily. Uses with Insulin Pump 04/09/23  Yes Kathlen Mody, MD  losartan (COZAAR) 100 MG tablet Take 100 mg by mouth daily.   Yes [provider]  montelukast (SINGULAIR) 10 MG tablet Take 10 mg by mouth at bedtime.   Yes [provider]  multivitamin (RENA-VIT) TABS tablet Take 1 tablet by mouth at  bedtime. 06/21/23 09/19/23 Yes Gillis Santa, MD  ondansetron (ZOFRAN) 8 MG tablet Take 8 mg by mouth daily.   Yes [provider]  sevelamer carbonate (RENVELA) 800 MG tablet Take 800-1,600 mg by mouth See admin instructions. Take 2 tablets (1600mg ) by mouth three times daily with meals and take 1 tablet (800mg ) by mouth daily with snacks   Yes [provider]  acetaminophen (TYLENOL) 500 MG tablet Take 2 tablets (1,000 mg total) by mouth every 6 (six) hours as needed for mild pain. 01/08/23   Henrene Dodge, MD  norethindrone-ethinyl estradiol (LOESTRIN) 1-20 MG-MCG tablet Take 1 tablet by mouth daily.    [provider]  oxyCODONE-acetaminophen (PERCOCET/ROXICET) 5-325 MG tablet Take 1 tablet by mouth every 6 (six) hours as needed. 06/30/23   [provider]  SUMAtriptan (IMITREX) 50 MG tablet Take 50 mg by mouth daily as needed for migraine.    [provider]  Vitamin D,  Ergocalciferol, (DRISDOL) 1.25 MG (50000 UNIT) CAPS capsule Take 1 capsule (50,000 Units total) by mouth every 7 (seven) days. 06/27/23 09/25/23  Gillis Santa, MD      VITAL SIGNS:  Blood pressure 138/83, pulse 95, temperature 98.1 F (36.7 C), temperature source Oral, resp. rate (!) 24, height 5\' 6"  (1.676 m), weight 68 kg, last menstrual period 06/14/2023, SpO2 98%.  PHYSICAL EXAMINATION:  Physical Exam  GENERAL:  27 y.o.-year-old African-American female patient lying in the bed with no acute distress.  EYES: Pupils equal, round, reactive to light and accommodation. No scleral icterus. Extraocular muscles intact.  HEENT: Head atraumatic, normocephalic. Oropharynx and nasopharynx clear.  NECK:  Supple, no jugular venous distention. No thyroid enlargement, no tenderness.  LUNGS: Normal breath sounds bilaterally, no wheezing, rales,rhonchi or crepitation. No use of accessory muscles of respiration.  CARDIOVASCULAR: Regular rate and rhythm, S1, S2 normal. No murmurs, rubs, or gallops.  ABDOMEN: Soft, nondistended, nontender. Bowel sounds present. No organomegaly or mass.  EXTREMITIES: No pedal edema, cyanosis, or clubbing.  Left foot is s/p debridement and is status post big toe amputation. NEUROLOGIC: Cranial nerves II through XII are intact. Muscle strength 5/5 in all extremities. Sensation intact. Gait not checked.  PSYCHIATRIC: The patient is alert and oriented x 3.  Normal affect and good eye contact. SKIN: No other obvious rash, lesion, or ulcer.   LABORATORY PANEL:   CBC Recent Labs  Lab 07/10/23 0123  WBC 12.6*  HGB 9.3*  HCT 29.7*  PLT 331   ------------------------------------------------------------------------------------------------------------------  Chemistries  Recent Labs  Lab 07/10/23 0123 07/10/23 0456  NA 129* 129*  K 5.0 4.1  CL 90* 95*  CO2 14* 12*  GLUCOSE 497* 470*  BUN 49* 51*  CREATININE 7.70* 7.62*  CALCIUM 7.4* 6.6*  AST 18  --   ALT 16  --    ALKPHOS 70  --   BILITOT 1.9*  --    ------------------------------------------------------------------------------------------------------------------  Cardiac Enzymes No results for input(s): "TROPONINI" in the last 168 hours. ------------------------------------------------------------------------------------------------------------------  RADIOLOGY:  DG Foot Complete Left  Result Date: 07/09/2023 CLINICAL DATA:  Postop amputation EXAM: LEFT FOOT - COMPLETE 3+ VIEW COMPARISON:  07/07/2023 FINDINGS: Interval amputation of the remnant base of first metatarsal as well as the distal medial cuneiform. Air in the soft tissues consistent with interval surgery. Chronic AVN of the second metatarsal. IMPRESSION: Interval amputation of the remnant base of first metatarsal and distal medial cuneiform. Electronically Signed   By: Jasmine Pang M.D.   On: 07/09/2023 20:45   DG  MINI C-ARM IMAGE ONLY  Result Date: 07/09/2023 There is no interpretation for this exam.  This order is for images obtained during a surgical procedure.  Please See "Surgeries" Tab for more information regarding the procedure.      IMPRESSION AND PLAN:  Assessment and Plan: * DKA (diabetic ketoacidosis) (HCC) - The patient will be admitted to a stepdown bed. - We will continue the on IV insulin drip per Endo[employer] DKA protocol. - The patient will be aggressively hydrated with IV normal saline. - Will follow serial BMPs.   Osteomyelitis of left foot (HCC) - Status post left foot debridement. - We will continue IV cefepime and vancomycin. - Pain management will be provided.  Hypertensive urgency - The patient will be continued on her antihypertensive therapy. - We will place on as needed IV labetalol.  End-stage renal disease on hemodialysis [facility]) - Nephrology consult will be obtained. - I notified Dr. Wynelle Link about the patient. - We will continue her Bumex and Renvela.  Migraine - We will continue her  Imitrex.  Dyslipidemia - We will continue statin therapy.    DVT prophylaxis: SQ heparin Advanced Care Planning:  Code Status: full code.  Family Communication:  The plan of care was discussed in details with the patient (and family). I answered all questions. The patient agreed to proceed with the above mentioned plan. Further management will depend upon hospital course. Disposition Plan: Back to previous home environment Consults called: Nephrology All the records are reviewed and case discussed with ED provider.  Status is: Inpatient    At the time of the admission, it appears that the appropriate admission status for this patient is inpatient.  This is judged to be reasonable and necessary in order to provide the required intensity of service to ensure the patient's safety given the presenting symptoms, physical exam findings and initial radiographic and laboratory data in the context of comorbid conditions.  The patient requires inpatient status due to high intensity of service, high risk of further deterioration and high frequency of surveillance required.  I certify that at the time of admission, it is my clinical judgment that the patient will require inpatient hospital care extending more than 2 midnights.                            Dispo: The patient is from: Home              Anticipated d/c is to: Home              Patient currently is not medically stable to d/c.              Difficult to place patient: No Authorized and performed by: Valente David, MD Total critical care time:  50      minutes. Due to a high probability of clinically significant, life-threatening deterioration, the patient required my highest level of preparedness to intervene emergently and I personally spent this critical care time directly and personally managing the patient.  This critical care time included obtaining a history, examining the patient, pulse oximetry, ordering and review of studies,  arranging urgent treatment with development of management plan, evaluation of patient's response to treatment, frequent reassessment, and discussions with other providers. This critical care time was performed to assess and manage the high probability of imminent, life-threatening deterioration that could result in multiorgan failure.  It was exclusive of separately billable procedures and treating other patients and  teaching time.   Hannah Beat M.D on 07/10/2023 at 6:52 AM  [facility]   From 7 PM-7 AM, contact night-coverage [email or web address]  CC: Primary care physician; [name]

## 2023-07-10 NOTE — Progress Notes (Signed)
PODIATRY / FOOT AND ANKLE SURGERY PROGRESS NOTE  Reason for consult: Left foot infection  Chief Complaint: Left foot pain/infection   HPI: Emma Thomas is a 27 y.o. female who presents status post 1 day after going a left foot incision and drainage with removal of a portion of the first metatarsal and medial cuneiform with excision of wound with closure.  Patient notes that she is having a fair amount of pain overall but has a very flat affect.  She rates her pain today at 10/10 and but does not appear to be in any distress.  She has kept her dressing clean, dry, and intact since the procedure and has not put weight on the foot as instructed.  Patient currently denies nausea, vomiting, fevers, chills.  PMHx:  Past Medical History:  Diagnosis Date   Anemia    Diabetes mellitus without complication (HCC)    Type 1 DM   Essential hypertension    Headache    Hypertension 03/04/2013   Neurologic disorder    Both feet   Neuromuscular disorder (HCC)    Recurrent UTI    Renal disorder     Surgical Hx:  Past Surgical History:  Procedure Laterality Date   abscess removal     excision of bartholin cyst   AMPUTATION Left 11/23/2022   Procedure: LEFT GREAT TOE PARTIAL RAY AMPUTATION;  Surgeon: Linus Galas, DPM;  Location: [facility];  Service: Podiatry;  Laterality: Left;   CAPD INSERTION N/A 06/09/2022   Procedure: LAPAROSCOPIC INSERTION CONTINUOUS AMBULATORY PERITONEAL DIALYSIS  (CAPD) CATHETER, PD rep to be present;  Surgeon: Henrene Dodge, MD;  Location: [facility];  Service: General;  Laterality: N/A;   CAPD INSERTION N/A 12/15/2022   Procedure: LAPAROSCOPIC INSERTION CONTINUOUS AMBULATORY PERITONEAL DIALYSIS  (CAPD) CATHETER;  Surgeon: Henrene Dodge, MD;  Location: [facility];  Service: General;  Laterality: N/A;   CAPD REMOVAL N/A 12/15/2022   Procedure: LAPAROSCOPIC REMOVAL CONTINUOUS AMBULATORY PERITONEAL DIALYSIS  (CAPD) CATHETER, removal of old catheter;  Surgeon: Henrene Dodge, MD;   Location: [facility];  Service: General;  Laterality: N/A;   CAPD REMOVAL N/A 01/08/2023   Procedure: LAPAROSCOPIC REMOVAL CONTINUOUS AMBULATORY PERITONEAL DIALYSIS  (CAPD) CATHETER;  Surgeon: Henrene Dodge, MD;  Location: [facility];  Service: General;  Laterality: N/A;   CAPD REVISION N/A 07/16/2022   Procedure: LAPAROSCOPIC REVISION CONTINUOUS AMBULATORY PERITONEAL DIALYSIS  (CAPD) CATHETER;  Surgeon: Henrene Dodge, MD;  Location: [facility];  Service: General;  Laterality: N/A;   CAPD REVISION N/A 11/24/2022   Procedure: LAPAROSCOPIC REVISION CONTINUOUS AMBULATORY PERITONEAL DIALYSIS  (CAPD) CATHETER;  Surgeon: Henrene Dodge, MD;  Location: [facility];  Service: General;  Laterality: N/A;   DIALYSIS/PERMA CATHETER INSERTION N/A 04/24/2022   Procedure: DIALYSIS/PERMA CATHETER INSERTION;  Surgeon: Renford Dills, MD;  Location: [facility];  Service: Cardiovascular;  Laterality: N/A;   DIALYSIS/PERMA CATHETER REMOVAL N/A 06/02/2022   Procedure: DIALYSIS/PERMA CATHETER REMOVAL;  Surgeon: Renford Dills, MD;  Location: [facility];  Service: Cardiovascular;  Laterality: N/A;   DIALYSIS/PERMA CATHETER REMOVAL N/A 12/28/2022   Procedure: DIALYSIS/PERMA CATHETER REMOVAL;  Surgeon: Annice Needy, MD;  Location: [facility];  Service: Cardiovascular;  Laterality: N/A;   INSERTION OF DIALYSIS CATHETER Right    ARM   IR FLUORO GUIDE CV LINE RIGHT  11/27/2022   IR US GUIDE VASC ACCESS RIGHT  11/27/2022   IRRIGATION AND DEBRIDEMENT FOOT Left 06/19/2023   Procedure: IRRIGATION AND DEBRIDEMENT FOOT PARTIAL 1ST RAY  AMPUTATION;  Surgeon: Rosetta Posner, DPM;  Location: [facility];  Service: Orthopedics/Podiatry;  Laterality: Left;   Nexplanon  01/2011   UMBILICAL HERNIA REPAIR N/A 06/09/2022   Procedure: HERNIA REPAIR UMBILICAL ADULT;  Surgeon: Henrene Dodge, MD;  Location: [facility];  Service: General;  Laterality: N/A;    FHx:  Family History  Problem Relation Age of Onset   Breast cancer  Mother 3   Lung cancer Maternal Grandmother    Diabetes type II Paternal Grandmother     Social History:  reports that she has never smoked. She has been exposed to tobacco smoke. She has never used smokeless tobacco. She reports that she does not currently use alcohol after a past usage of about 2.0 standard drinks of alcohol per week. She reports that she does not use drugs.  Allergies: No Known Allergies  Medications Prior to Admission  Medication Sig Dispense Refill   amLODipine (NORVASC) 10 MG tablet Take 1 tablet (10 mg total) by mouth daily. 30 tablet 0   atorvastatin (LIPITOR) 80 MG tablet Take 80 mg by mouth daily.     bumetanide (BUMEX) 1 MG tablet Take 1 mg by mouth 2 (two) times daily.     furosemide (LASIX) 80 MG tablet Take 80 mg by mouth daily.     HUMALOG 100 UNIT/ML injection Inject 0-1 mLs (0-100 Units total) into the skin daily. Uses with Insulin Pump 10 mL 11   losartan (COZAAR) 100 MG tablet Take 100 mg by mouth daily.     montelukast (SINGULAIR) 10 MG tablet Take 10 mg by mouth at bedtime.     multivitamin (RENA-VIT) TABS tablet Take 1 tablet by mouth at bedtime. 30 tablet 2   ondansetron (ZOFRAN) 8 MG tablet Take 8 mg by mouth daily.     sevelamer carbonate (RENVELA) 800 MG tablet Take 800-1,600 mg by mouth See admin instructions. Take 2 tablets (1600mg ) by mouth three times daily with meals and take 1 tablet (800mg ) by mouth daily with snacks     acetaminophen (TYLENOL) 500 MG tablet Take 2 tablets (1,000 mg total) by mouth every 6 (six) hours as needed for mild pain.     norethindrone-ethinyl estradiol (LOESTRIN) 1-20 MG-MCG tablet Take 1 tablet by mouth daily.     oxyCODONE-acetaminophen (PERCOCET/ROXICET) 5-325 MG tablet Take 1 tablet by mouth every 6 (six) hours as needed.     SUMAtriptan (IMITREX) 50 MG tablet Take 50 mg by mouth daily as needed for migraine.     Vitamin D, Ergocalciferol, (DRISDOL) 1.25 MG (50000 UNIT) CAPS capsule Take 1 capsule (50,000 Units  total) by mouth every 7 (seven) days. 12 capsule 0    Physical Exam: General: Alert and oriented.  Stating 10 out of 10 pain but appears to be in no apparent distress.  Vascular: DP/PT pulses palpable bilateral, capillary fill time intact to digits bilaterally, minimal hair growth noted to digits of bilateral lower extremities.  Overall appears to have mild edema present to the left foot but no erythema.  Neuro: Light touch sensation reduced to nearly absent to bilateral lower extremities.  Derm: Incision lines appear to be well coapted with sutures intact, packing intact to the dorsal and plantar foot.  No active drainage on the dressing or when examining the foot.  After packing was removed did appear to have a fair amount of serous and mostly bloody fluid that was able to be exsanguinated.  No purulence expressed.  MSK: Left partial first ray amputation including first metatarsal  in its entirety and a portion of the medial cuneiform.  Results for orders placed or performed during the hospital encounter of 07/10/23 (from the past 48 hour(s))  Comprehensive metabolic panel     Status: Abnormal   Collection Time: 07/10/23  1:23 AM  Result Value Ref Range   Sodium 129 (L) 135 - 145 mmol/L   Potassium 5.0 3.5 - 5.1 mmol/L    Comment: REPEATED TO VERIFY   Chloride 90 (L) 98 - 111 mmol/L   CO2 14 (L) 22 - 32 mmol/L   Glucose, Bld 497 (H) 70 - 99 mg/dL    Comment: Glucose reference range applies only to samples taken after fasting for at least 8 hours.   BUN 49 (H) 6 - 20 mg/dL   Creatinine, Ser 1.61 (H) 0.44 - 1.00 mg/dL   Calcium 7.4 (L) 8.9 - 10.3 mg/dL   Total Protein 7.6 6.5 - 8.1 g/dL   Albumin 2.9 (L) 3.5 - 5.0 g/dL   AST 18 15 - 41 U/L   ALT 16 0 - 44 U/L   Alkaline Phosphatase 70 38 - 126 U/L   Total Bilirubin 1.9 (H) 0.3 - 1.2 mg/dL   GFR, Estimated 7 (L) >60 mL/min    Comment: (NOTE) Calculated using the CKD-EPI Creatinine Equation (2021)    Anion gap 25 (H) 5 - 15     Comment: ELECTROLYTES REPEATED TO VERIFY Performed at [facility], [address] [place], [place] [address]   Beta-hydroxybutyric acid     Status: Abnormal   Collection Time: 07/10/23  1:23 AM  Result Value Ref Range   Beta-Hydroxybutyric Acid >8.00 (H) 0.05 - 0.27 mmol/L    Comment: RESULT CONFIRMED BY MANUAL DILUTION BGH Performed at [facility], [address] [place], [place] [address]   CBC with Differential     Status: Abnormal   Collection Time: 07/10/23  1:23 AM  Result Value Ref Range   WBC 12.6 (H) 4.0 - 10.5 K/uL   RBC 3.20 (L) 3.87 - 5.11 MIL/uL   Hemoglobin 9.3 (L) 12.0 - 15.0 g/dL   HCT 11.9 (L) 14.7 - 82.9 %   MCV 92.8 80.0 - 100.0 fL   MCH 29.1 26.0 - 34.0 pg   MCHC 31.3 30.0 - 36.0 g/dL   RDW 56.2 13.0 - 86.5 %   Platelets 331 150 - 400 K/uL   nRBC 0.2 0.0 - 0.2 %   Neutrophils Relative % 95 %   Neutro Abs 12.1 (H) 1.7 - 7.7 K/uL   Lymphocytes Relative 3 %   Lymphs Abs 0.4 (L) 0.7 - 4.0 K/uL   Monocytes Relative 1 %   Monocytes Absolute 0.1 0.1 - 1.0 K/uL   Eosinophils Relative 0 %   Eosinophils Absolute 0.0 0.0 - 0.5 K/uL   Basophils Relative 0 %   Basophils Absolute 0.0 0.0 - 0.1 K/uL   Immature Granulocytes 1 %   Abs Immature Granulocytes 0.07 0.00 - 0.07 K/uL    Comment: Performed at [facility], [address] [place], [place] [address]  Blood gas, venous     Status: Abnormal   Collection Time: 07/10/23  2:51 AM  Result Value Ref Range   pH, Ven 7.16 (LL) 7.25 - 7.43    Comment: CRITICAL RESULT CALLED TO, READ BACK BY AND VERIFIED WITH: JADE SUNG [id number] DT BSRT    pCO2, Ven 24 (L) 44 - 60 mmHg   pO2, Ven 60 (H) 32 - 45 mmHg  Bicarbonate 8.6 (L) 20.0 - 28.0 mmol/L   Acid-base deficit 18.4 (H) 0.0 - 2.0 mmol/L   O2 Saturation 86.8 %   Patient temperature 37.0    Collection site VEIN     Comment: Performed at [facility], [address] [place], [place] [address]  CBG monitoring, ED      Status: Abnormal   Collection Time: 07/10/23  3:41 AM  Result Value Ref Range   Glucose-Capillary 456 (H) 70 - 99 mg/dL    Comment: Glucose reference range applies only to samples taken after fasting for at least 8 hours.  CBG monitoring, ED     Status: Abnormal   Collection Time: 07/10/23  4:15 AM  Result Value Ref Range   Glucose-Capillary 436 (H) 70 - 99 mg/dL    Comment: Glucose reference range applies only to samples taken after fasting for at least 8 hours.  CBG monitoring, ED     Status: Abnormal   Collection Time: 07/10/23  4:52 AM  Result Value Ref Range   Glucose-Capillary 407 (H) 70 - 99 mg/dL    Comment: Glucose reference range applies only to samples taken after fasting for at least 8 hours.  Basic metabolic panel     Status: Abnormal   Collection Time: 07/10/23  4:56 AM  Result Value Ref Range   Sodium 129 (L) 135 - 145 mmol/L    Comment: ELECTROLYTES REPEATED TO VERIFY.PMF   Potassium 4.1 3.5 - 5.1 mmol/L   Chloride 95 (L) 98 - 111 mmol/L   CO2 12 (L) 22 - 32 mmol/L   Glucose, Bld 470 (H) 70 - 99 mg/dL    Comment: Glucose reference range applies only to samples taken after fasting for at least 8 hours.   BUN 51 (H) 6 - 20 mg/dL   Creatinine, Ser 6.04 (H) 0.44 - 1.00 mg/dL   Calcium 6.6 (L) 8.9 - 10.3 mg/dL   GFR, Estimated 7 (L) >60 mL/min    Comment: (NOTE) Calculated using the CKD-EPI Creatinine Equation (2021)    Anion gap 22 (H) 5 - 15    Comment: Performed at [facility], [address] [place], [place] [address]  CBG monitoring, ED     Status: Abnormal   Collection Time: 07/10/23  5:24 AM  Result Value Ref Range   Glucose-Capillary 412 (H) 70 - 99 mg/dL    Comment: Glucose reference range applies only to samples taken after fasting for at least 8 hours.  CBG monitoring, ED     Status: Abnormal   Collection Time: 07/10/23  5:59 AM  Result Value Ref Range   Glucose-Capillary 375 (H) 70 - 99 mg/dL    Comment: Glucose reference range applies  only to samples taken after fasting for at least 8 hours.  CBG monitoring, ED     Status: Abnormal   Collection Time: 07/10/23  7:21 AM  Result Value Ref Range   Glucose-Capillary 324 (H) 70 - 99 mg/dL    Comment: Glucose reference range applies only to samples taken after fasting for at least 8 hours.  CBG monitoring, ED     Status: Abnormal   Collection Time: 07/10/23  8:13 AM  Result Value Ref Range   Glucose-Capillary 288 (H) 70 - 99 mg/dL    Comment: Glucose reference range applies only to samples taken after fasting for at least 8 hours.  Basic metabolic panel     Status: Abnormal   Collection Time: 07/10/23  8:51 AM  Result Value  Ref Range   Sodium 133 (L) 135 - 145 mmol/L   Potassium 3.9 3.5 - 5.1 mmol/L   Chloride 98 98 - 111 mmol/L   CO2 14 (L) 22 - 32 mmol/L   Glucose, Bld 269 (H) 70 - 99 mg/dL    Comment: Glucose reference range applies only to samples taken after fasting for at least 8 hours.   BUN 53 (H) 6 - 20 mg/dL   Creatinine, Ser 4.09 (H) 0.44 - 1.00 mg/dL   Calcium 7.0 (L) 8.9 - 10.3 mg/dL   GFR, Estimated 7 (L) >60 mL/min    Comment: (NOTE) Calculated using the CKD-EPI Creatinine Equation (2021)    Anion gap 21 (H) 5 - 15    Comment: Performed at [facility], [address] [place], [place] [address]  Beta-hydroxybutyric acid     Status: Abnormal   Collection Time: 07/10/23  8:51 AM  Result Value Ref Range   Beta-Hydroxybutyric Acid 2.37 (H) 0.05 - 0.27 mmol/L    Comment: Performed at [facility], [address] [place], [place] [address]  Glucose, capillary     Status: Abnormal   Collection Time: 07/10/23  9:11 AM  Result Value Ref Range   Glucose-Capillary 250 (H) 70 - 99 mg/dL    Comment: Glucose reference range applies only to samples taken after fasting for at least 8 hours.  Glucose, capillary     Status: Abnormal   Collection Time: 07/10/23 10:10 AM  Result Value Ref Range   Glucose-Capillary 230 (H) 70 - 99 mg/dL     Comment: Glucose reference range applies only to samples taken after fasting for at least 8 hours.  Glucose, capillary     Status: Abnormal   Collection Time: 07/10/23 11:03 AM  Result Value Ref Range   Glucose-Capillary 195 (H) 70 - 99 mg/dL    Comment: Glucose reference range applies only to samples taken after fasting for at least 8 hours.  Glucose, capillary     Status: Abnormal   Collection Time: 07/10/23 12:08 PM  Result Value Ref Range   Glucose-Capillary 178 (H) 70 - 99 mg/dL    Comment: Glucose reference range applies only to samples taken after fasting for at least 8 hours.  Glucose, capillary     Status: Abnormal   Collection Time: 07/10/23  1:03 PM  Result Value Ref Range   Glucose-Capillary 141 (H) 70 - 99 mg/dL    Comment: Glucose reference range applies only to samples taken after fasting for at least 8 hours.  Basic metabolic panel     Status: Abnormal   Collection Time: 07/10/23  1:36 PM  Result Value Ref Range   Sodium 134 (L) 135 - 145 mmol/L   Potassium 3.9 3.5 - 5.1 mmol/L   Chloride 99 98 - 111 mmol/L   CO2 20 (L) 22 - 32 mmol/L   Glucose, Bld 136 (H) 70 - 99 mg/dL    Comment: Glucose reference range applies only to samples taken after fasting for at least 8 hours.   BUN 54 (H) 6 - 20 mg/dL   Creatinine, Ser 5.62 (H) 0.44 - 1.00 mg/dL   Calcium 6.8 (L) 8.9 - 10.3 mg/dL   GFR, Estimated 7 (L) >60 mL/min    Comment: (NOTE) Calculated using the CKD-EPI Creatinine Equation (2021)    Anion gap 15 5 - 15    Comment: Performed at [facility], [address] [place], [place] [address]  Beta-hydroxybutyric acid  Status: None   Collection Time: 07/10/23  1:36 PM  Result Value Ref Range   Beta-Hydroxybutyric Acid 0.13 0.05 - 0.27 mmol/L    Comment: Performed at [facility], [address] [place], [place] [address]  Glucose, capillary     Status: Abnormal   Collection Time: 07/10/23  2:08 PM  Result Value Ref Range   Glucose-Capillary  134 (H) 70 - 99 mg/dL    Comment: Glucose reference range applies only to samples taken after fasting for at least 8 hours.   DG Foot Complete Left  Result Date: 07/09/2023 CLINICAL DATA:  Postop amputation EXAM: LEFT FOOT - COMPLETE 3+ VIEW COMPARISON:  07/07/2023 FINDINGS: Interval amputation of the remnant base of first metatarsal as well as the distal medial cuneiform. Air in the soft tissues consistent with interval surgery. Chronic AVN of the second metatarsal. IMPRESSION: Interval amputation of the remnant base of first metatarsal and distal medial cuneiform. Electronically Signed   By: Jasmine Pang M.D.   On: 07/09/2023 20:45   DG MINI C-ARM IMAGE ONLY  Result Date: 07/09/2023 There is no interpretation for this exam.  This order is for images obtained during a surgical procedure.  Please See "Surgeries" Tab for more information regarding the procedure.    Blood pressure 126/83, pulse 87, temperature 98.8 F (37.1 C), temperature source Oral, resp. rate 17, height 5\' 6"  (1.676 m), weight 78.1 kg, last menstrual period 06/14/2023, SpO2 95%.  Assessment DKA History of uncontrolled type 1 diabetes Possible osteomyelitis to the left first ray with abscess versus seroma End-stage renal disease on dialysis Hypertensive urgency Chronic anemia  Plan -Patient seen and examined. -X-ray imaging reviewed.  Appears to have adequate bony resection of residual first metatarsal and a portion of the medial cuneiform.  Care was taken to try to leave as much of the tibialis anterior intact as possible intraoperatively.  Does appear to have some air within the soft tissue consistent with surgery but also was likely in the area of packing gauze. -Incision site appears to be well coapted with sutures intact at the 2 procedural areas.  Packing was removed today and a fair amount of serous sanguinous fluid was able to be exsanguinated from the packing sites.  Hemostasis appeared to be well-controlled with  compression. -Reapplied packing gauze followed by 4 x 4 gauze, ABD, Kerlix, Coban with moderate compression.  Order placed for daily changes of dressings. -So far culture reports have been negative for growth indicating most likely that this area was a seroma.  Pathology report still pending.  Bone was very soft at the first metatarsal and distal medial cuneiform consistent with likely osteomyelitis.  Unsure if resected margin will be clear of infection.  Still recommend IV antibiotics for now.  Base IV antibiotics on previous culture growing strep mitis/oralis and finegoldia magna.  Only sensitive to vancomycin.  Appreciate infectious disease recommendations for further antibiotic therapy.  Likely would recommend staying on a prolonged course of IV antibiotics due to recurrent infection. -Will reevaluate patient on Monday and potentially plan for discharge depending on resolution of multiple issues. -Recommend still keeping off the left foot is much as possible but may have gentle heel contact for short distances in postop shoe.  Rosetta Posner, DPM 07/10/2023, 3:52 PM

## 2023-07-10 NOTE — Assessment & Plan Note (Addendum)
Patient was doing hemodialysis at home, nephrology was consulted and patient will received dialysis while in hospital. - Continue Renvela. -Nephrology holding Bumex

## 2023-07-10 NOTE — Assessment & Plan Note (Addendum)
-   Status post left foot debridement.  Preliminary cultures with no growth Patient did not met sepsis criteria-sepsis ruled out. - We will continue IV cefepime and vancomycin. -Follow-up podiatry recommendations. - Continue with pain management

## 2023-07-10 NOTE — Assessment & Plan Note (Signed)
-   We will continue statin therapy. 

## 2023-07-10 NOTE — Progress Notes (Signed)
       CROSS COVER NOTE  NAME: TIFFANEE TSAI MRN: [id number] DOB : [birth year]    Concern as stated by nurse / staff        Pertinent findings on chart review: Acidosis resolved. CBG in range 140-180 beta hydrox normal 0.9  Assessment and  Interventions   Assessment:  Plan: Per Shauna Hugh RN MSN CDE Diabetes Coordinator -Transition to Semglee 14 units (80% home insulin basal rate) and give 2 hrs. Prior to IV insulin being discontinued. -Novolog 0-9 units q 4 hrs. And cover CBG @ time of IV insulin discontinued  transfer to medsurg      Donnie Mesa NP [facility] Cross Cover 7pm-7am - check amion for availability Pager [phone]

## 2023-07-10 NOTE — Assessment & Plan Note (Addendum)
Gap remained open, CBG improving, remained on Endo tool.  Patient wants to start on diet, she left AMA yesterday to get a burger?? -Starting on diet to prevent another AMA while she remained in DKA. -Continue with Endo tool -Will switch to basal and short-acting once gap closed x 2

## 2023-07-11 DIAGNOSIS — E1010 Type 1 diabetes mellitus with ketoacidosis without coma: Secondary | ICD-10-CM | POA: Diagnosis not present

## 2023-07-11 DIAGNOSIS — N186 End stage renal disease: Secondary | ICD-10-CM | POA: Diagnosis not present

## 2023-07-11 DIAGNOSIS — M869 Osteomyelitis, unspecified: Secondary | ICD-10-CM | POA: Diagnosis not present

## 2023-07-11 DIAGNOSIS — I160 Hypertensive urgency: Secondary | ICD-10-CM | POA: Diagnosis not present

## 2023-07-11 DIAGNOSIS — Z992 Dependence on renal dialysis: Secondary | ICD-10-CM

## 2023-07-11 LAB — BASIC METABOLIC PANEL
Anion gap: 13 (ref 5–15)
BUN: 54 mg/dL — ABNORMAL HIGH (ref 6–20)
CO2: 19 mmol/L — ABNORMAL LOW (ref 22–32)
Calcium: 6.7 mg/dL — ABNORMAL LOW (ref 8.9–10.3)
Chloride: 101 mmol/L (ref 98–111)
Creatinine, Ser: 7.97 mg/dL — ABNORMAL HIGH (ref 0.44–1.00)
GFR, Estimated: 7 mL/min — ABNORMAL LOW (ref >60)
Glucose, Bld: 90 mg/dL (ref 70–99)
Potassium: 3.3 mmol/L — ABNORMAL LOW (ref 3.5–5.1)
Sodium: 133 mmol/L — ABNORMAL LOW (ref 135–145)

## 2023-07-11 LAB — GLUCOSE, CAPILLARY
Glucose-Capillary: 101 mg/dL — ABNORMAL HIGH (ref 70–99)
Glucose-Capillary: 128 mg/dL — ABNORMAL HIGH (ref 70–99)
Glucose-Capillary: 133 mg/dL — ABNORMAL HIGH (ref 70–99)
Glucose-Capillary: 148 mg/dL — ABNORMAL HIGH (ref 70–99)
Glucose-Capillary: 151 mg/dL — ABNORMAL HIGH (ref 70–99)
Glucose-Capillary: 163 mg/dL — ABNORMAL HIGH (ref 70–99)
Glucose-Capillary: 92 mg/dL (ref 70–99)

## 2023-07-11 LAB — CBC
HCT: 25.2 % — ABNORMAL LOW (ref 36.0–46.0)
Hemoglobin: 8.3 g/dL — ABNORMAL LOW (ref 12.0–15.0)
MCH: 29.2 pg (ref 26.0–34.0)
MCHC: 32.9 g/dL (ref 30.0–36.0)
MCV: 88.7 fL (ref 80.0–100.0)
Platelets: 280 10*3/uL (ref 150–400)
RBC: 2.84 MIL/uL — ABNORMAL LOW (ref 3.87–5.11)
RDW: 12.2 % (ref 11.5–15.5)
WBC: 10.4 10*3/uL (ref 4.0–10.5)
nRBC: 0 % (ref 0.0–0.2)

## 2023-07-11 LAB — MAGNESIUM: Magnesium: 2.2 mg/dL (ref 1.7–2.4)

## 2023-07-11 MED ORDER — POTASSIUM CHLORIDE CRYS ER 20 MEQ PO TBCR
20.0000 meq | EXTENDED_RELEASE_TABLET | Freq: Once | ORAL | Status: AC
  Administered 2023-07-11: 20 meq via ORAL
  Filled 2023-07-11: qty 1

## 2023-07-11 MED ORDER — SODIUM CHLORIDE 0.9 % IV SOLN: 12.5000 mg | Freq: Four times a day (QID) | INTRAVENOUS | Status: DC | PRN

## 2023-07-11 MED ORDER — POTASSIUM CHLORIDE 20 MEQ PO PACK
40.0000 meq | PACK | Freq: Once | ORAL | Status: AC
  Administered 2023-07-11: 40 meq via ORAL
  Filled 2023-07-11: qty 2

## 2023-07-11 MED ORDER — SODIUM BICARBONATE 650 MG PO TABS
650.0000 mg | ORAL_TABLET | Freq: Two times a day (BID) | ORAL | Status: DC
  Administered 2023-07-11 – 2023-07-13 (×4): 650 mg via ORAL
  Filled 2023-07-11 (×5): qty 1

## 2023-07-11 MED ORDER — METOCLOPRAMIDE HCL 5 MG/ML IJ SOLN
10.0000 mg | Freq: Four times a day (QID) | INTRAMUSCULAR | Status: DC
  Administered 2023-07-11 – 2023-07-13 (×5): 10 mg via INTRAVENOUS
  Filled 2023-07-11 (×5): qty 2

## 2023-07-11 MED ORDER — POTASSIUM CHLORIDE CRYS ER 20 MEQ PO TBCR: 40.0000 meq | EXTENDED_RELEASE_TABLET | Freq: Once | ORAL | Status: DC

## 2023-07-11 MED ORDER — POLYETHYLENE GLYCOL 3350 17 G PO PACK
17.0000 g | PACK | Freq: Every day | ORAL | Status: DC
  Administered 2023-07-11: 17 g via ORAL
  Filled 2023-07-11: qty 1

## 2023-07-11 NOTE — Assessment & Plan Note (Signed)
Patient was doing hemodialysis at home, nephrology was consulted and patient will received dialysis while in hospital. - Continue Renvela. -Nephrology holding Bumex

## 2023-07-11 NOTE — Progress Notes (Signed)
[facility]  ROUNDING NOTE   Subjective:   Emma Thomas admitted to [facility] after leaving AMA last night so she could eat a burger.   Patient readmitted for DKA (diabetic ketoacidosis) (HCC) [E11.10] ESRD on hemodialysis (HCC) [N18.6, Z99.2] Diabetic ketoacidosis without coma associated with type 1 diabetes mellitus (HCC) [E10.10] Osteomyelitis of left foot, unspecified type (HCC) [M86.9] Pseudohyponatremia [R79.89]  Patient transferred from ICU yesterday Insulin drip stopped Reports pain in left foot, managed fair with pain medications   Objective:  Vital signs in last 24 hours:  Temp:  [98.1 F (36.7 C)-98.5 F (36.9 C)] 98.1 F (36.7 C) (08/25 0815) Pulse Rate:  [80-89] 83 (08/25 0815) Resp:  [9-20] 18 (08/25 0815) BP: (123-145)/(79-100) 139/89 (08/25 0815) SpO2:  [90 %-100 %] 100 % (08/25 0815)  Weight change: 10.1 kg Filed Weights   07/09/23 2145 07/10/23 0800  Weight: 68 kg 78.1 kg    Intake/Output: I/O last 3 completed shifts: In: 2613.6 [I.V.:2513.6; IV Piggyback:100] Out: 800 [Urine:800]   Intake/Output this shift:  No intake/output data recorded.  Physical Exam: General: NAD  Head: Normocephalic, atraumatic. Moist oral mucosal membranes  Eyes: Anicteric  Lungs:  Clear to auscultation  Heart: Regular rate and rhythm  Abdomen:  Soft, nontender  Extremities:  no peripheral edema. Left foot in dressings, clean and dry  Neurologic: Nonfocal, moving all four extremities  Skin: No lesions  Access: Rt AVF    Basic Metabolic Panel: Recent Labs  Lab 07/08/23 1632 07/08/23 2055 07/10/23 0851 07/10/23 1336 07/10/23 1756 07/10/23 2123 07/11/23 0441 07/11/23 0445  NA 132*   < > 133* 134* 132* 132*  133*  --  133*  K 4.3   < > 3.9 3.9 3.8 3.5  3.6  --  3.3*  CL 92*   < > 98 99 97* 99  100  --  101  CO2 14*   < > 14* 20* 20* 19*  21*  --  19*  GLUCOSE 344*   < > 269* 136* 147* 155*  158*  --  90  BUN 37*   < > 53* 54* 56* 54*  54*   --  54*  CREATININE 6.00*   < > 7.59* 7.69* 7.95* 8.04*  7.90*  --  7.97*  CALCIUM 7.3*   < > 7.0* 6.8* 6.6* 6.5*  6.5*  --  6.7*  MG  --   --   --   --   --   --  2.2  --   PHOS 6.5*  --   --   --   --  5.6*  --   --    < > = values in this interval not displayed.    Liver Function Tests: Recent Labs  Lab 07/07/23 1348 07/08/23 1632 07/10/23 0123 07/10/23 2123  AST 22  --  18  --   ALT 14  --  16  --   ALKPHOS 65  --  70  --   BILITOT 1.5*  --  1.9*  --   PROT 7.1  --  7.6  --   ALBUMIN 2.7* 2.4* 2.9* 2.4*   No results for input(s): "LIPASE", "AMYLASE" in the last 168 hours. No results for input(s): "AMMONIA" in the last 168 hours.  CBC: Recent Labs  Lab 07/07/23 1210 07/08/23 0622 07/08/23 2055 07/09/23 0431 07/10/23 0123 07/10/23 2123 07/11/23 0445  WBC 3.7*   < > 16.9* 14.3* 12.6* 12.1* 10.4  NEUTROABS 3.3  --   --  11.2* 12.1*  --   --   HGB 8.2*   < > 6.8* 7.9* 9.3* 7.8* 8.3*  HCT 25.2*   < > 19.6* 23.2* 29.7* 23.3* 25.2*  MCV 91.6   < > 87.5 86.9 92.8 88.6 88.7  PLT 304   < > 247 269 331 260 280   < > = values in this interval not displayed.    Cardiac Enzymes: No results for input(s): "CKTOTAL", "CKMB", "CKMBINDEX", "TROPONINI" in the last 168 hours.  BNP: Invalid input(s): "POCBNP"  CBG: Recent Labs  Lab 07/10/23 2356 07/11/23 0050 07/11/23 0159 07/11/23 0436 07/11/23 0851  GLUCAP 139* 133* 128* 92 101*    Microbiology: Results for orders placed or performed during the hospital encounter of 07/07/23  Blood Culture (routine x 2)     Status: None (Preliminary result)   Collection Time: 07/07/23 12:10 PM   Specimen: BLOOD  Result Value Ref Range Status   Specimen Description BLOOD BLOOD LEFT ARM  Final   Special Requests   Final    BOTTLES DRAWN AEROBIC AND ANAEROBIC Blood Culture adequate volume   Culture   Final    NO GROWTH 4 DAYS Performed at [facility], [address] [place], [place] [address]    Report Status  PENDING  Incomplete  SARS Coronavirus 2 by RT PCR (hospital order, performed in [facility] hospital lab) *cepheid single result test* Anterior Nasal Swab     Status: None   Collection Time: 07/07/23 12:10 PM   Specimen: Anterior Nasal Swab  Result Value Ref Range Status   SARS Coronavirus 2 by RT PCR NEGATIVE NEGATIVE Final    Comment: (NOTE) SARS-CoV-2 target nucleic acids are NOT DETECTED.  The SARS-CoV-2 RNA is generally detectable in upper and lower respiratory specimens during the acute phase of infection. The lowest concentration of SARS-CoV-2 viral copies this assay can detect is 250 copies / mL. A negative result does not preclude SARS-CoV-2 infection and should not be used as the sole basis for treatment or other patient management decisions.  A negative result may occur with improper specimen collection / handling, submission of specimen other than nasopharyngeal swab, presence of viral mutation(s) within the areas targeted by this assay, and inadequate number of viral copies (<250 copies / mL). A negative result must be combined with clinical observations, patient history, and epidemiological information.  Fact Sheet for Patients:   RoadLapTop.co.za  Fact Sheet for Healthcare Providers: http://kim-miller.com/  This test is not yet approved or  cleared by the [place] FDA and has been authorized for detection and/or diagnosis of SARS-CoV-2 by FDA under an Emergency Use Authorization (EUA).  This EUA will remain in effect (meaning this test can be used) for the duration of the COVID-19 declaration under Section 564(b)(1) of the Act, 21 U.S.C. section 360bbb-3(b)(1), unless the authorization is terminated or revoked sooner.  Performed at [facility], [address] [place], [place] [address]   Culture, blood (Routine X 2) w Reflex to ID Panel     Status: None (Preliminary result)   Collection Time: 07/07/23  10:46 PM   Specimen: BLOOD  Result Value Ref Range Status   Specimen Description BLOOD  LAC  Final   Special Requests   Final    BOTTLES DRAWN AEROBIC AND ANAEROBIC Blood Culture adequate volume   Culture   Final    NO GROWTH 4 DAYS Performed at [facility], [address], [place], [place] [address]    Report  Status PENDING  Incomplete  MRSA Next Gen by PCR, Nasal     Status: None   Collection Time: 07/07/23 11:50 PM   Specimen: Nasal Mucosa; Nasal Swab  Result Value Ref Range Status   MRSA by PCR Next Gen NOT DETECTED NOT DETECTED Final    Comment: (NOTE) The GeneXpert MRSA Assay (FDA approved for NASAL specimens only), is one component of a comprehensive MRSA colonization surveillance program. It is not intended to diagnose MRSA infection nor to guide or monitor treatment for MRSA infections. Test performance is not FDA approved in patients less than 62 years old. Performed at [facility], [address] [place], [place] [address]   Aerobic/Anaerobic Culture w Gram Stain (surgical/deep wound)     Status: None (Preliminary result)   Collection Time: 07/09/23  5:01 PM   Specimen: Foot, Left; Wound  Result Value Ref Range Status   Specimen Description WOUND FOOT  Final   Special Requests SWAB SAMPLE A  Final   Gram Stain   Final    FEW WBC PRESENT, PREDOMINANTLY PMN NO ORGANISMS SEEN    Culture   Final    NO GROWTH < 24 HOURS Performed at [facility], [address], [place], [place] [address]    Report Status PENDING  Incomplete  Aerobic/Anaerobic Culture w Gram Stain (surgical/deep wound)     Status: None (Preliminary result)   Collection Time: 07/09/23  5:17 PM   Specimen: Foot, Left; Wound  Result Value Ref Range Status   Specimen Description BONE LEFT FOOT  Final   Special Requests 1ST METATARSAL SAMPLE B  Final   Gram Stain NO WBC SEEN NO ORGANISMS SEEN   Final   Culture   Final    NO GROWTH < 24 HOURS Performed at [facility], [address] [place], [place] [address]    Report Status PENDING  Incomplete  Aerobic/Anaerobic Culture w Gram Stain (surgical/deep wound)     Status: None (Preliminary result)   Collection Time: 07/09/23  5:22 PM   Specimen: Foot, Left; Wound  Result Value Ref Range Status   Specimen Description BONE LEFT FOOT 1ST CUNEIFORM  Final   Special Requests NONE  Final   Gram Stain NO WBC SEEN NO ORGANISMS SEEN   Final   Culture   Final    NO GROWTH < 24 HOURS Performed at [facility], [address], [place], [place] [address]    Report Status PENDING  Incomplete    Coagulation Studies: No results for input(s): "LABPROT", "INR" in the last 72 hours.   Urinalysis: No results for input(s): "COLORURINE", "LABSPEC", "PHURINE", "GLUCOSEU", "HGBUR", "BILIRUBINUR", "KETONESUR", "PROTEINUR", "UROBILINOGEN", "NITRITE", "LEUKOCYTESUR" in the last 72 hours.  Invalid input(s): "APPERANCEUR"     Imaging: DG Foot Complete Left  Result Date: 07/09/2023 CLINICAL DATA:  Postop amputation EXAM: LEFT FOOT - COMPLETE 3+ VIEW COMPARISON:  07/07/2023 FINDINGS: Interval amputation of the remnant base of first metatarsal as well as the distal medial cuneiform. Air in the soft tissues consistent with interval surgery. Chronic AVN of the second metatarsal. IMPRESSION: Interval amputation of the remnant base of first metatarsal and distal medial cuneiform. Electronically Signed   By: Jasmine Pang M.D.   On: 07/09/2023 20:45   DG MINI C-ARM IMAGE ONLY  Result Date: 07/09/2023 There is no interpretation for this exam.  This order is for images obtained during a surgical procedure.  Please See "Surgeries" Tab for more information regarding the  procedure.     Medications:    sodium chloride Stopped (07/11/23 0017)   ceFEPime (MAXIPIME) IV Stopped (07/10/23 2118)   [START ON 07/13/2023] vancomycin      amLODipine  10 mg Oral Daily   atorvastatin  80 mg Oral Daily   Chlorhexidine  Gluconate Cloth  6 each Topical Daily   epoetin (EPOGEN/PROCRIT) injection  10,000 Units Intravenous Q T,Th,Sa-HD   heparin injection (subcutaneous)  5,000 Units Subcutaneous Q8H   insulin aspart  0-9 Units Subcutaneous Q4H   insulin glargine-yfgn  14 Units Subcutaneous Q24H   losartan  100 mg Oral Daily   montelukast  10 mg Oral QHS   multivitamin  1 tablet Oral QHS   norethindrone-ethinyl estradiol  1 tablet Oral Daily   sevelamer carbonate  1,600 mg Oral TID with meals   sodium bicarbonate  650 mg Oral BID   [START ON 07/12/2023] Vitamin D (Ergocalciferol)  50,000 Units Oral Q7 days   sodium chloride, acetaminophen **OR** acetaminophen, dextrose, dextrose, HYDROmorphone (DILAUDID) injection, magnesium hydroxide, ondansetron **OR** ondansetron (ZOFRAN) IV, oxyCODONE-acetaminophen, sevelamer carbonate, SUMAtriptan, traZODone  Assessment/ Plan:  [name] is a 27 y.o.  female with past medical history of diabetes, anemia, and diabetes type 1, and end stage renal disease on peritoneal dialysis. Patient presents to the ED with fever and body aches. She has been admitted for DKA (diabetic ketoacidosis) (HCC) [E11.10] ESRD on hemodialysis (HCC) [N18.6, Z99.2] Diabetic ketoacidosis without coma associated with type 1 diabetes mellitus (HCC) [E10.10] Osteomyelitis of left foot, unspecified type (HCC) [M86.9] Pseudohyponatremia [R79.89]  End stage renal disease: on home hemodialysis  - Due to urgent cases, unable to perform dialysis on Saturday.  - Patient stable without signs of distress. - Will maintain inpatient schedule of MWF.    2. Anemia of chronic kidney disease Lab Results  Component Value Date   HGB 8.3 (L) 07/11/2023    - Hgb below desired range - ESA scheduled with dialysis treatments  3. Secondary Hyperparathyroidism: with hypocalcemia and hyperphosphatemia.    Lab Results  Component Value Date   CALCIUM 6.7 (L) 07/11/2023   CAION 0.97 (L) 01/08/2023   PHOS  5.6 (H) 07/10/2023    - Calcium slowly improving, continue ergocalciferol - Sevelamer with meals.   4. Diabetes mellitus type II with chronic kidney disease: insulin dependent. Most recent hemoglobin A1c is 8.5 on 06/18/23.   - with DKA on admission  - Insulin gtt stopped on 07/10/23   5. Sepsis secondary to left foot abscess. 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 I&D on 8/23 for abscess.   - Continue empiric cefepime and vancomycin  6. Hypertension: urgency on admission. Now at 139/89  - losartan and amlodipine  - hold bumetanide.    LOS: 1   8/25/202411:27 AM

## 2023-07-11 NOTE — Progress Notes (Signed)
Progress Note   Patient: Emma Thomas ZOX:[id number] DOB: 06/15/1996 DOA: 07/10/2023     1 DOS: the patient was seen and examined on 07/11/2023   Brief hospital course: Taken from H&P.  Emma Thomas is a 27 y.o. female with medical history significant for ESRD on home hemodialysis X 4 days, type 1 diabetes mellitus, hypertension, dyslipidemia, HFpEF and anemia of chronic disease, who was just admitted here for left foot osteomyelitis and underwent debridement by Dr. Excell Seltzer.  She was being managed in stepdown for DKA and left AMA yesterday.  Came back overnight with mild nausea and left foot pain.  On representation to ER, blood pressure was 181/109, pseudohyponatremia secondary to hyperglycemia, CO2 was 12 and anion gap of 22, BUN 49, creatinine 7.7.VBG showed pH 7.16 and bicarbonate of 8.6. Total bili was 1.9 with albumin 2.9 otherwise unremarkable LFTs. CBC showed leukocytosis 12.6 with neutrophilia and anemia better than her previous levels.   Patient was again started on DKA protocol with Endo tool along with IV antibiotics with cefepime and vancomycin.  8/24: CBG improving.  Recent wound cultures from debridement remain negative, prior wound cultures on 8/3 with Streptococcus mitis/oralis which were only sensitive to vancomycin.  Starting on diet at her request to prevent another AMA, gap still open.  8/25: DKA resolved and patient is now on basal and short-acting insulin.  Packing was removed by podiatry yesterday.  Cultures remain negative.  Podiatry wants to continue with IV antibiotics and involve ID based on her prior cultures.  They will change another dressing tomorrow before discharge.   Assessment and Plan: * DKA (diabetic ketoacidosis) (HCC) DKA resolved, gap closed and patient is now on basal and short-acting. -Continue with current management   Osteomyelitis of left foot (HCC) - Status post left foot debridement.  Preliminary cultures with no growth Patient did not  met sepsis criteria-sepsis ruled out. - We will continue IV cefepime and vancomycin, as requested by podiatry. -Follow-up podiatry recommendations. - Continue with pain management  End-stage renal disease on hemodialysis [facility]) Patient was doing hemodialysis at home, nephrology was consulted and patient will received dialysis while in hospital. - Continue Renvela. -Nephrology holding Bumex  Hypertensive urgency Blood pressure improving - Continue with home antihypertensives - We will place on as needed IV labetalol.  Dyslipidemia - We will continue statin therapy.  Migraine - We will continue her Imitrex.   Subjective: Patient was seen and examined today.  Pain seems much improved with medications.  No new concern  Physical Exam: Vitals:   07/11/23 0100 07/11/23 0200 07/11/23 0218 07/11/23 0815  BP: (!) 136/92 (!) 135/91 (!) 145/100 139/89  Pulse: 83 87 86 83  Resp: (!) 9 17 18 18   Temp: 98.1 F (36.7 C)  98.5 F (36.9 C) 98.1 F (36.7 C)  TempSrc: Oral     SpO2: 90% 95% 99% 100%  Weight:      Height:       General.  Well-developed lady, in no acute distress. Pulmonary.  Lungs clear bilaterally, normal respiratory effort. CV.  Regular rate and rhythm, no JVD, rub or murmur. Abdomen.  Soft, nontender, nondistended, BS positive. CNS.  Alert and oriented .  No focal neurologic deficit. Extremities.  No edema, no cyanosis, pulses intact. Left foot with Ace wrap Psychiatry.  Judgment and insight appears normal.    Data Reviewed: Prior data reviewed  Family Communication: Discussed with patient  Disposition: Status is: Inpatient Remains inpatient appropriate because: Severity of illness  Planned Discharge Destination: Home  DVT prophylaxis.  Subcu heparin Time spent: 45 minutes  This record has been created using Conservation officer, historic buildings. Errors have been sought and corrected,but may not always be located. Such creation errors do not reflect on the  standard of care.   Author: Arnetha Courser, MD 07/11/2023 2:45 PM  For on call review [email or web address].

## 2023-07-11 NOTE — Assessment & Plan Note (Signed)
-   Status post left foot debridement.  Patient did not met sepsis criteria-sepsis ruled out. Intraoperative cultures eventually started growing rare bacteria, similar to the prior cultures.  Discussed with ID and patient will need IV vancomycin based on prior resistance along with p.o. Flagyl.  Duration will be determined after bone biopsy results are available - Continue current antibiotics, will likely need Flagyl as advised by ID -Follow-up podiatry recommendations. - Continue with pain management

## 2023-07-11 NOTE — Plan of Care (Addendum)
Pt requested to ambulate to the restroom. Pt was reeducated on PWB orders, but insisted on going to the bedroom. Problem: Fluid Volume: Goal: Hemodynamic stability will improve Outcome: Progressing   Problem: Clinical Measurements: Goal: Diagnostic test results will improve Outcome: Progressing Goal: Signs and symptoms of infection will decrease Outcome: Progressing   Problem: Respiratory: Goal: Ability to maintain adequate ventilation will improve Outcome: Progressing   Problem: Education: Goal: Ability to describe self-care measures that may prevent or decrease complications (Diabetes Survival Skills Education) will improve Outcome: Progressing Goal: Individualized Educational Video(s) Outcome: Progressing   Problem: Cardiac: Goal: Ability to maintain an adequate cardiac output will improve Outcome: Progressing   Problem: Health Behavior/Discharge Planning: Goal: Ability to identify and utilize available resources and services will improve Outcome: Progressing Goal: Ability to manage health-related needs will improve Outcome: Progressing   Problem: Fluid Volume: Goal: Ability to achieve a balanced intake and output will improve Outcome: Progressing   Problem: Metabolic: Goal: Ability to maintain appropriate glucose levels will improve Outcome: Progressing   Problem: Nutritional: Goal: Maintenance of adequate nutrition will improve Outcome: Progressing Goal: Maintenance of adequate weight for body size and type will improve Outcome: Progressing   Problem: Respiratory: Goal: Will regain and/or maintain adequate ventilation Outcome: Progressing   Problem: Urinary Elimination: Goal: Ability to achieve and maintain adequate renal perfusion and functioning will improve Outcome: Progressing   Problem: Education: Goal: Ability to describe self-care measures that may prevent or decrease complications (Diabetes Survival Skills Education) will improve Outcome:  Progressing Goal: Individualized Educational Video(s) Outcome: Progressing   Problem: Coping: Goal: Ability to adjust to condition or change in health will improve Outcome: Progressing   Problem: Fluid Volume: Goal: Ability to maintain a balanced intake and output will improve Outcome: Progressing   Problem: Health Behavior/Discharge Planning: Goal: Ability to identify and utilize available resources and services will improve Outcome: Progressing Goal: Ability to manage health-related needs will improve Outcome: Progressing   Problem: Metabolic: Goal: Ability to maintain appropriate glucose levels will improve Outcome: Progressing   Problem: Nutritional: Goal: Maintenance of adequate nutrition will improve Outcome: Progressing Goal: Progress toward achieving an optimal weight will improve Outcome: Progressing   Problem: Skin Integrity: Goal: Risk for impaired skin integrity will decrease Outcome: Progressing   Problem: Tissue Perfusion: Goal: Adequacy of tissue perfusion will improve Outcome: Progressing   Problem: Education: Goal: Knowledge of General Education information will improve Description: Including pain rating scale, medication(s)/side effects and non-pharmacologic comfort measures Outcome: Progressing   Problem: Health Behavior/Discharge Planning: Goal: Ability to manage health-related needs will improve Outcome: Progressing   Problem: Clinical Measurements: Goal: Ability to maintain clinical measurements within normal limits will improve Outcome: Progressing Goal: Will remain free from infection Outcome: Progressing Goal: Diagnostic test results will improve Outcome: Progressing Goal: Respiratory complications will improve Outcome: Progressing Goal: Cardiovascular complication will be avoided Outcome: Progressing   Problem: Activity: Goal: Risk for activity intolerance will decrease Outcome: Progressing   Problem: Nutrition: Goal: Adequate  nutrition will be maintained Outcome: Progressing   Problem: Coping: Goal: Level of anxiety will decrease Outcome: Progressing   Problem: Elimination: Goal: Will not experience complications related to bowel motility Outcome: Progressing Goal: Will not experience complications related to urinary retention Outcome: Progressing   Problem: Pain Managment: Goal: General experience of comfort will improve Outcome: Progressing   Problem: Safety: Goal: Ability to remain free from injury will improve Outcome: Progressing   Problem: Skin Integrity: Goal: Risk for impaired skin integrity will decrease Outcome: Progressing

## 2023-07-11 NOTE — Assessment & Plan Note (Signed)
DKA resolved, gap closed and patient is now on basal and short-acting. -Continue with current management

## 2023-07-12 ENCOUNTER — Encounter: Payer: Self-pay | Admitting: Podiatry

## 2023-07-12 DIAGNOSIS — E1010 Type 1 diabetes mellitus with ketoacidosis without coma: Secondary | ICD-10-CM | POA: Diagnosis not present

## 2023-07-12 DIAGNOSIS — M86172 Other acute osteomyelitis, left ankle and foot: Secondary | ICD-10-CM | POA: Diagnosis not present

## 2023-07-12 DIAGNOSIS — Z794 Long term (current) use of insulin: Secondary | ICD-10-CM

## 2023-07-12 DIAGNOSIS — E1169 Type 2 diabetes mellitus with other specified complication: Secondary | ICD-10-CM | POA: Diagnosis not present

## 2023-07-12 DIAGNOSIS — I160 Hypertensive urgency: Secondary | ICD-10-CM | POA: Diagnosis not present

## 2023-07-12 DIAGNOSIS — M869 Osteomyelitis, unspecified: Secondary | ICD-10-CM | POA: Diagnosis not present

## 2023-07-12 DIAGNOSIS — N186 End stage renal disease: Secondary | ICD-10-CM | POA: Diagnosis not present

## 2023-07-12 LAB — CBC
HCT: 26.1 % — ABNORMAL LOW (ref 36.0–46.0)
Hemoglobin: 8.6 g/dL — ABNORMAL LOW (ref 12.0–15.0)
MCH: 29 pg (ref 26.0–34.0)
MCHC: 33 g/dL (ref 30.0–36.0)
MCV: 87.9 fL (ref 80.0–100.0)
Platelets: 259 10*3/uL (ref 150–400)
RBC: 2.97 MIL/uL — ABNORMAL LOW (ref 3.87–5.11)
RDW: 12.1 % (ref 11.5–15.5)
WBC: 6.3 10*3/uL (ref 4.0–10.5)
nRBC: 0 % (ref 0.0–0.2)

## 2023-07-12 LAB — CULTURE, BLOOD (ROUTINE X 2)
Culture: NO GROWTH
Culture: NO GROWTH
Special Requests: ADEQUATE
Special Requests: ADEQUATE

## 2023-07-12 LAB — GLUCOSE, CAPILLARY
Glucose-Capillary: 118 mg/dL — ABNORMAL HIGH (ref 70–99)
Glucose-Capillary: 122 mg/dL — ABNORMAL HIGH (ref 70–99)
Glucose-Capillary: 127 mg/dL — ABNORMAL HIGH (ref 70–99)
Glucose-Capillary: 132 mg/dL — ABNORMAL HIGH (ref 70–99)
Glucose-Capillary: 143 mg/dL — ABNORMAL HIGH (ref 70–99)
Glucose-Capillary: 52 mg/dL — ABNORMAL LOW (ref 70–99)
Glucose-Capillary: 62 mg/dL — ABNORMAL LOW (ref 70–99)

## 2023-07-12 LAB — BASIC METABOLIC PANEL
Anion gap: 16 — ABNORMAL HIGH (ref 5–15)
BUN: 51 mg/dL — ABNORMAL HIGH (ref 6–20)
CO2: 18 mmol/L — ABNORMAL LOW (ref 22–32)
Calcium: 7.3 mg/dL — ABNORMAL LOW (ref 8.9–10.3)
Chloride: 102 mmol/L (ref 98–111)
Creatinine, Ser: 8.44 mg/dL — ABNORMAL HIGH (ref 0.44–1.00)
GFR, Estimated: 6 mL/min — ABNORMAL LOW (ref >60)
Glucose, Bld: 134 mg/dL — ABNORMAL HIGH (ref 70–99)
Potassium: 4.1 mmol/L (ref 3.5–5.1)
Sodium: 136 mmol/L (ref 135–145)

## 2023-07-12 MED ORDER — OXYCODONE-ACETAMINOPHEN 5-325 MG PO TABS
ORAL_TABLET | ORAL | Status: AC
  Filled 2023-07-12: qty 1

## 2023-07-12 MED ORDER — HYDROMORPHONE HCL 1 MG/ML IJ SOLN
INTRAMUSCULAR | Status: AC
  Filled 2023-07-12: qty 1

## 2023-07-12 MED ORDER — EPOETIN ALFA 10000 UNIT/ML IJ SOLN
10000.0000 [IU] | INTRAMUSCULAR | Status: DC
  Administered 2023-07-12: 10000 [IU] via INTRAVENOUS

## 2023-07-12 MED ORDER — VANCOMYCIN HCL 750 MG/150ML IV SOLN
750.0000 mg | Freq: Once | INTRAVENOUS | Status: AC
  Administered 2023-07-12: 750 mg via INTRAVENOUS
  Filled 2023-07-12: qty 150

## 2023-07-12 MED ORDER — EPOETIN ALFA 10000 UNIT/ML IJ SOLN
INTRAMUSCULAR | Status: AC
  Filled 2023-07-12: qty 1

## 2023-07-12 MED ORDER — METRONIDAZOLE 500 MG/100ML IV SOLN
500.0000 mg | Freq: Two times a day (BID) | INTRAVENOUS | Status: DC
  Administered 2023-07-12 – 2023-07-13 (×2): 500 mg via INTRAVENOUS
  Filled 2023-07-12 (×3): qty 100

## 2023-07-12 NOTE — Inpatient Diabetes Management (Signed)
Inpatient Diabetes Program Recommendations  AACE/ADA: New Consensus Statement on Inpatient Glycemic Control (2015)  Target Ranges:  Prepandial:   less than 140 mg/dL      Peak postprandial:   less than 180 mg/dL (1-2 hours)      Critically ill patients:  140 - 180 mg/dL    Latest Reference Range & Units 07/11/23 00:50 07/11/23 01:59 07/11/23 04:36 07/11/23 08:51 07/11/23 11:53 07/11/23 17:44 07/11/23 20:01  Glucose-Capillary 70 - 99 mg/dL 865 (H)   14 units Semglee @0014  128 (H)  IV Insulin Drip Stopped  1 unit Novolog  92 101 (H) 148 (H)  2 units Novolog  163 (H)  2 units Novolog  151 (H)  2 units Novolog   (H): Data is abnormally high  Latest Reference Range & Units 07/12/23 00:15 07/12/23 04:33 07/12/23 08:13 07/12/23 08:57  Glucose-Capillary 70 - 99 mg/dL 784 (H)  1 unit Novolog  14 units Semglee  122 (H)  1 unit Novolog  52 (L) 132 (H)  (H): Data is abnormally high (L): Data is abnormally low   Admit with: DKA/ Osteomyelitis of left foot   History: Type 1 Diabetes, ESRD  Home DM Meds: Tandem Insulin Pump (see below for pump settings)  Current Orders: Semglee 14 units Q24H      Novolog Sensitive Correction Scale/ SSI (0-9 units) Q4 hours   MD- Note Hypoglycemia this AM  Please consider:  1. Reduce Semglee slightly to 12 units Q24H  2. Adjust the Novolog SSI to TID AC + HS (currently Q4 hours)    ENDO: Glean Hess, PA with [facility] Last seen by Telemedicine Visit 04/06/2023 Insulin Pump Settings: Tandem Insulin Pump--14.8 units total basal per 24 hours Normal  12a 0.5, 65, 8 3a 0.5, 65, 8 6a 0.6, 50, 8 11a 0.6, 50, 8 4p 0.8,40, 7 9p 0.6, 55, 8  HIGH 12a 0.8, 40, 7 8a 0.9, 35, 6 4p 1.1, 30, 6 9p 0.9, 35, 7     --Will follow patient during hospitalization--  Ambrose Finland RN, MSN, CDCES Diabetes Coordinator Inpatient Glycemic Control Team Team Pager: [phone] (8a-5p)

## 2023-07-12 NOTE — Plan of Care (Signed)
  Problem: Fluid Volume: Goal: Hemodynamic stability will improve Outcome: Progressing   Problem: Clinical Measurements: Goal: Diagnostic test results will improve Outcome: Progressing Goal: Signs and symptoms of infection will decrease Outcome: Progressing   Problem: Respiratory: Goal: Ability to maintain adequate ventilation will improve Outcome: Progressing   Problem: Education: Goal: Ability to describe self-care measures that may prevent or decrease complications (Diabetes Survival Skills Education) will improve Outcome: Progressing Goal: Individualized Educational Video(s) Outcome: Progressing   Problem: Cardiac: Goal: Ability to maintain an adequate cardiac output will improve Outcome: Progressing   Problem: Health Behavior/Discharge Planning: Goal: Ability to identify and utilize available resources and services will improve Outcome: Progressing Goal: Ability to manage health-related needs will improve Outcome: Progressing   Problem: Fluid Volume: Goal: Ability to achieve a balanced intake and output will improve Outcome: Progressing   Problem: Metabolic: Goal: Ability to maintain appropriate glucose levels will improve Outcome: Progressing   Problem: Nutritional: Goal: Maintenance of adequate nutrition will improve Outcome: Progressing Goal: Maintenance of adequate weight for body size and type will improve Outcome: Progressing   Problem: Respiratory: Goal: Will regain and/or maintain adequate ventilation Outcome: Progressing   Problem: Urinary Elimination: Goal: Ability to achieve and maintain adequate renal perfusion and functioning will improve Outcome: Progressing   Problem: Education: Goal: Ability to describe self-care measures that may prevent or decrease complications (Diabetes Survival Skills Education) will improve Outcome: Progressing Goal: Individualized Educational Video(s) Outcome: Progressing   Problem: Coping: Goal: Ability to adjust  to condition or change in health will improve Outcome: Progressing   Problem: Fluid Volume: Goal: Ability to maintain a balanced intake and output will improve Outcome: Progressing   Problem: Health Behavior/Discharge Planning: Goal: Ability to identify and utilize available resources and services will improve Outcome: Progressing Goal: Ability to manage health-related needs will improve Outcome: Progressing   Problem: Metabolic: Goal: Ability to maintain appropriate glucose levels will improve Outcome: Progressing   Problem: Nutritional: Goal: Maintenance of adequate nutrition will improve Outcome: Progressing Goal: Progress toward achieving an optimal weight will improve Outcome: Progressing   Problem: Skin Integrity: Goal: Risk for impaired skin integrity will decrease Outcome: Progressing   Problem: Tissue Perfusion: Goal: Adequacy of tissue perfusion will improve Outcome: Progressing   Problem: Education: Goal: Knowledge of General Education information will improve Description: Including pain rating scale, medication(s)/side effects and non-pharmacologic comfort measures Outcome: Progressing   Problem: Health Behavior/Discharge Planning: Goal: Ability to manage health-related needs will improve Outcome: Progressing   Problem: Clinical Measurements: Goal: Ability to maintain clinical measurements within normal limits will improve Outcome: Progressing Goal: Will remain free from infection Outcome: Progressing Goal: Diagnostic test results will improve Outcome: Progressing Goal: Respiratory complications will improve Outcome: Progressing Goal: Cardiovascular complication will be avoided Outcome: Progressing   Problem: Activity: Goal: Risk for activity intolerance will decrease Outcome: Progressing   Problem: Nutrition: Goal: Adequate nutrition will be maintained Outcome: Progressing   Problem: Coping: Goal: Level of anxiety will decrease Outcome:  Progressing   Problem: Elimination: Goal: Will not experience complications related to bowel motility Outcome: Progressing Goal: Will not experience complications related to urinary retention Outcome: Progressing   Problem: Pain Managment: Goal: General experience of comfort will improve Outcome: Progressing   Problem: Safety: Goal: Ability to remain free from injury will improve Outcome: Progressing   Problem: Skin Integrity: Goal: Risk for impaired skin integrity will decrease Outcome: Progressing   

## 2023-07-12 NOTE — Progress Notes (Signed)
PT Cancellation Note  Patient Details Name: Emma Thomas MRN: [id number] DOB: [birth year]   Cancelled Treatment:    Reason Eval/Treat Not Completed: Patient at procedure or test/unavailable (Patient currently off unit for dialysis.  Will re-attempt at later time/date as medically appropriate and available.)   Eduardo Wurth H. Manson Passey, PT, DPT, NCS 07/12/23, 10:12 AM [phone]

## 2023-07-12 NOTE — Progress Notes (Signed)
OT Cancellation Note  Patient Details Name: Emma Thomas MRN: [id number] DOB: July 03, 1996   Cancelled Treatment:    Reason Eval/Treat Not Completed: Patient at procedure or test/ unavailable. OT orders received, upon arrival to room transport taking pt to HD. Will check back this afternoon as able.   Lise Auer Tameyah Koch 07/12/2023, 8:58 AM

## 2023-07-12 NOTE — Progress Notes (Signed)
Progress Note   Patient: Emma Thomas ION:[id number] DOB: 1996/02/01 DOA: 07/10/2023     2 DOS: the patient was seen and examined on 07/12/2023   Brief hospital course: Taken from H&P.  Emma Thomas is a 27 y.o. female with medical history significant for ESRD on home hemodialysis X 4 days, type 1 diabetes mellitus, hypertension, dyslipidemia, HFpEF and anemia of chronic disease, who was just admitted here for left foot osteomyelitis and underwent debridement by Dr. Excell Seltzer.  She was being managed in stepdown for DKA and left AMA yesterday.  Came back overnight with mild nausea and left foot pain.  On representation to ER, blood pressure was 181/109, pseudohyponatremia secondary to hyperglycemia, CO2 was 12 and anion gap of 22, BUN 49, creatinine 7.7.VBG showed pH 7.16 and bicarbonate of 8.6. Total bili was 1.9 with albumin 2.9 otherwise unremarkable LFTs. CBC showed leukocytosis 12.6 with neutrophilia and anemia better than her previous levels.   Patient was again started on DKA protocol with Endo tool along with IV antibiotics with cefepime and vancomycin.  8/24: CBG improving.  Recent wound cultures from debridement remain negative, prior wound cultures on 8/3 with Streptococcus mitis/oralis which were only sensitive to vancomycin.  Starting on diet at her request to prevent another AMA, gap still open.  8/25: DKA resolved and patient is now on basal and short-acting insulin.  Packing was removed by podiatry yesterday.  Cultures remain negative.  Podiatry wants to continue with IV antibiotics and involve ID based on her prior cultures.  They will change another dressing tomorrow before discharge.  8/26: Cultures started growing rare similar bacteria as prior culture.  Patient will need IV vancomycin due to the resistance and Flagyl per ID.  Nephrology is trying to arrange outpatient dialysis so IV vancomycin can be given during that time.  Currently doing home hemodialysis.   Assessment  and Plan: * DKA (diabetic ketoacidosis) (HCC) DKA resolved, gap closed and patient is now on basal and short-acting. -Continue with current management   Osteomyelitis of left foot (HCC) - Status post left foot debridement.  Patient did not met sepsis criteria-sepsis ruled out. Intraoperative cultures eventually started growing rare bacteria, similar to the prior cultures.  Discussed with ID and patient will need IV vancomycin based on prior resistance along with p.o. Flagyl.  Duration will be determined after bone biopsy results are available - Continue current antibiotics, will likely need Flagyl as advised by ID -Follow-up podiatry recommendations. - Continue with pain management  End-stage renal disease on hemodialysis [facility]) Patient was doing hemodialysis at home, nephrology was consulted and patient will received dialysis while in hospital. - Continue Renvela. -Nephrology holding Bumex  Hypertensive urgency Blood pressure improving - Continue with home antihypertensives - We will place on as needed IV labetalol.  Dyslipidemia - We will continue statin therapy.  Migraine - We will continue her Imitrex.   Subjective: Patient was seen during HD today, no new concern.  Physical Exam: Vitals:   07/12/23 1200 07/12/23 1230 07/12/23 1300 07/12/23 1317  BP: (!) 189/99 (!) 171/99 (!) 166/101 (!) 172/102  Pulse: 95 88 87 89  Resp: 15 15 16 18   Temp:      TempSrc:    Oral  SpO2: 100% 99% 100% 100%  Weight:      Height:       General.  Well developed lady,In no acute distress. Pulmonary.  Lungs clear bilaterally, normal respiratory effort. CV.  Regular rate and rhythm, no JVD, rub or  murmur. Abdomen.  Soft, nontender, nondistended, BS positive. CNS.  Alert and oriented .  No focal neurologic deficit. Extremities.  No edema, no cyanosis, pulses intact and symmetrical. Psychiatry.  Judgment and insight appears normal.    Data Reviewed: Prior data reviewed  Family  Communication: Discussed with patient  Disposition: Status is: Inpatient Remains inpatient appropriate because: Severity of illness  Planned Discharge Destination: Home  DVT prophylaxis.  Subcu heparin Time spent: 40 minutes  This record has been created using Conservation officer, historic buildings. Errors have been sought and corrected,but may not always be located. Such creation errors do not reflect on the standard of care.   Author: Arnetha Courser, MD 07/12/2023 4:40 PM  For on call review [email or web address].

## 2023-07-12 NOTE — Progress Notes (Signed)
Mobility Specialist - Progress Note   07/12/23 0917  Mobility  Activity Off unit   Pt off unit during attempt, will attempt at another date/time.   Terrilyn Saver  Mobility Specialist  07/12/23 9:18 AM

## 2023-07-12 NOTE — Progress Notes (Signed)
Date of Admission:  07/10/2023    ID: Emma Thomas is a 27 y.o. female  Principal Problem:   DKA (diabetic ketoacidosis) (HCC) Active Problems:   Osteomyelitis of left foot (HCC)   End-stage renal disease on hemodialysis (HCC)   Dyslipidemia   Migraine   Hypertensive urgency   ESRD on hemodialysis (HCC)    Subjective: Feeling better today No complaints  Medications:   amLODipine  10 mg Oral Daily   atorvastatin  80 mg Oral Daily   Chlorhexidine Gluconate Cloth  6 each Topical Daily   epoetin (EPOGEN/PROCRIT) injection  10,000 Units Intravenous Q M,W,F-HD   heparin injection (subcutaneous)  5,000 Units Subcutaneous Q8H   insulin aspart  0-9 Units Subcutaneous Q4H   insulin glargine-yfgn  14 Units Subcutaneous Q24H   losartan  100 mg Oral Daily   metoCLOPramide (REGLAN) injection  10 mg Intravenous Q6H   montelukast  10 mg Oral QHS   multivitamin  1 tablet Oral QHS   norethindrone-ethinyl estradiol  1 tablet Oral Daily   polyethylene glycol  17 g Oral Daily   sevelamer carbonate  1,600 mg Oral TID with meals   sodium bicarbonate  650 mg Oral BID   Vitamin D (Ergocalciferol)  50,000 Units Oral Q7 days    Objective: Vital signs in last 24 hours: Patient Vitals for the past 24 hrs:  BP Temp Temp src Pulse Resp SpO2 Weight  07/12/23 1548 (!) 159/95 98.4 F (36.9 C) Oral 90 -- 100 % --  07/12/23 1317 (!) 172/102 -- Oral 89 18 100 % --  07/12/23 1300 (!) 166/101 -- -- 87 16 100 % --  07/12/23 1230 (!) 171/99 -- -- 88 15 99 % --  07/12/23 1200 (!) 189/99 -- -- 95 15 100 % --  07/12/23 1130 (!) 161/100 -- -- 93 11 100 % --  07/12/23 1100 (!) 156/97 -- -- 88 16 98 % --  07/12/23 1030 (!) 155/99 -- -- 88 16 97 % --  07/12/23 1000 (!) 152/96 -- -- 88 18 97 % --  07/12/23 0937 (!) 159/99 -- -- 95 11 95 % --  07/12/23 0923 (!) 158/96 98.1 F (36.7 C) Oral 98 15 98 % 76.5 kg  07/12/23 0114 (!) 156/99 98.9 F (37.2 C) -- 88 18 96 % --      PHYSICAL EXAM:  General:  Alert, cooperative, no distress, appears stated age.  Lungs: b/l air entry Heart:s1s2 Abdomen: Soft, non-tender,not distended. Bowel sounds normal. No masses Extremities: atraumatic, no cyanosis. No edema. No clubbing Skin: No rashes or lesions. Or bruising Lymph: Cervical, supraclavicular normal. Neurologic: Grossly non-focal  Lab Results    Latest Ref Rng & Units 07/12/2023   10:11 AM 07/11/2023    4:45 AM 07/10/2023    9:23 PM  CBC  WBC 4.0 - 10.5 K/uL 6.3  10.4  12.1   Hemoglobin 12.0 - 15.0 g/dL 8.6  8.3  7.8   Hematocrit 36.0 - 46.0 % 26.1  25.2  23.3   Platelets 150 - 400 K/uL 259  280  260        Latest Ref Rng & Units 07/12/2023    4:43 AM 07/11/2023    4:45 AM 07/10/2023    9:23 PM  CMP  Glucose 70 - 99 mg/dL 161  90  096    045   BUN 6 - 20 mg/dL 51  54  54    54   Creatinine 0.44 - 1.00 mg/dL  8.44  7.97  7.90    8.04   Sodium 135 - 145 mmol/L 136  133  133    132   Potassium 3.5 - 5.1 mmol/L 4.1  3.3  3.6    3.5   Chloride 98 - 111 mmol/L 102  101  100    99   CO2 22 - 32 mmol/L 18  19  21    19    Calcium 8.9 - 10.3 mg/dL 7.3  6.7  6.5    6.5       Microbiology: Foot wound- strep mitis 1st met bone- finegoldia Studies/Results: No results found.   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

## 2023-07-12 NOTE — Progress Notes (Signed)
[facility]  ROUNDING NOTE   Subjective:   Emma Thomas admitted to [facility] after leaving AMA last night so she could eat a burger.   Patient readmitted for DKA (diabetic ketoacidosis) (HCC) [E11.10] ESRD on hemodialysis (HCC) [N18.6, Z99.2] Diabetic ketoacidosis without coma associated with type 1 diabetes mellitus (HCC) [E10.10] Osteomyelitis of left foot, unspecified type (HCC) [M86.9] Pseudohyponatremia [R79.89]  Patient seen and evaluated during dialysis   HEMODIALYSIS FLOWSHEET:  Blood Flow Rate (mL/min): 400 mL/min Arterial Pressure (mmHg): -170 mmHg Venous Pressure (mmHg): 190 mmHg TMP (mmHg): 4 mmHg Ultrafiltration Rate (mL/min): 686 mL/min Dialysate Flow Rate (mL/min): 300 ml/min  Tolerating treatment well Pain managed well with prescribed medications  Objective:  Vital signs in last 24 hours:  Temp:  [98.1 F (36.7 C)-98.9 F (37.2 C)] 98.1 F (36.7 C) (08/26 0923) Pulse Rate:  [88-98] 88 (08/26 1100) Resp:  [11-18] 16 (08/26 1100) BP: (147-162)/(94-102) 156/97 (08/26 1100) SpO2:  [95 %-100 %] 98 % (08/26 1100) Weight:  [76.5 kg] 76.5 kg (08/26 0923)  Weight change:  Filed Weights   07/09/23 2145 07/10/23 0800 07/12/23 0923  Weight: 68 kg 78.1 kg 76.5 kg    Intake/Output: I/O last 3 completed shifts: In: 992.1 [I.V.:892.1; IV Piggyback:100] Out: 300 [Urine:300]   Intake/Output this shift:  No intake/output data recorded.  Physical Exam: General: NAD  Head: Normocephalic, atraumatic. Moist oral mucosal membranes  Eyes: Anicteric  Lungs:  Clear to auscultation  Heart: Regular rate and rhythm  Abdomen:  Soft, nontender  Extremities:  no peripheral edema. Left foot in dressings, clean and dry  Neurologic: Nonfocal, moving all four extremities  Skin: No lesions  Access: Rt AVF    Basic Metabolic Panel: Recent Labs  Lab 07/08/23 1632 07/08/23 2055 07/10/23 1336 07/10/23 1756 07/10/23 2123 07/11/23 0441 07/11/23 0445  07/12/23 0443  NA 132*   < > 134* 132* 132*  133*  --  133* 136  K 4.3   < > 3.9 3.8 3.5  3.6  --  3.3* 4.1  CL 92*   < > 99 97* 99  100  --  101 102  CO2 14*   < > 20* 20* 19*  21*  --  19* 18*  GLUCOSE 344*   < > 136* 147* 155*  158*  --  90 134*  BUN 37*   < > 54* 56* 54*  54*  --  54* 51*  CREATININE 6.00*   < > 7.69* 7.95* 8.04*  7.90*  --  7.97* 8.44*  CALCIUM 7.3*   < > 6.8* 6.6* 6.5*  6.5*  --  6.7* 7.3*  MG  --   --   --   --   --  2.2  --   --   PHOS 6.5*  --   --   --  5.6*  --   --   --    < > = values in this interval not displayed.    Liver Function Tests: Recent Labs  Lab 07/07/23 1348 07/08/23 1632 07/10/23 0123 07/10/23 2123  AST 22  --  18  --   ALT 14  --  16  --   ALKPHOS 65  --  70  --   BILITOT 1.5*  --  1.9*  --   PROT 7.1  --  7.6  --   ALBUMIN 2.7* 2.4* 2.9* 2.4*   No results for input(s): "LIPASE", "AMYLASE" in the last 168 hours. No results for input(s): "  AMMONIA" in the last 168 hours.  CBC: Recent Labs  Lab 07/07/23 1210 07/08/23 0622 07/09/23 0431 07/10/23 0123 07/10/23 2123 07/11/23 0445 07/12/23 1011  WBC 3.7*   < > 14.3* 12.6* 12.1* 10.4 6.3  NEUTROABS 3.3  --  11.2* 12.1*  --   --   --   HGB 8.2*   < > 7.9* 9.3* 7.8* 8.3* 8.6*  HCT 25.2*   < > 23.2* 29.7* 23.3* 25.2* 26.1*  MCV 91.6   < > 86.9 92.8 88.6 88.7 87.9  PLT 304   < > 269 331 260 280 259   < > = values in this interval not displayed.    Cardiac Enzymes: No results for input(s): "CKTOTAL", "CKMB", "CKMBINDEX", "TROPONINI" in the last 168 hours.  BNP: Invalid input(s): "POCBNP"  CBG: Recent Labs  Lab 07/11/23 2001 07/12/23 0015 07/12/23 0433 07/12/23 0813 07/12/23 0857  GLUCAP 151* 143* 122* 52* 132*    Microbiology: Results for orders placed or performed during the hospital encounter of 07/07/23  Blood Culture (routine x 2)     Status: None   Collection Time: 07/07/23 12:10 PM   Specimen: BLOOD  Result Value Ref Range Status   Specimen  Description BLOOD BLOOD LEFT ARM  Final   Special Requests   Final    BOTTLES DRAWN AEROBIC AND ANAEROBIC Blood Culture adequate volume   Culture   Final    NO GROWTH 5 DAYS Performed at [facility], [address] [place], [place] [address]    Report Status 07/12/2023 FINAL  Final  SARS Coronavirus 2 by RT PCR (hospital order, performed in [facility] hospital lab) *cepheid single result test* Anterior Nasal Swab     Status: None   Collection Time: 07/07/23 12:10 PM   Specimen: Anterior Nasal Swab  Result Value Ref Range Status   SARS Coronavirus 2 by RT PCR NEGATIVE NEGATIVE Final    Comment: (NOTE) SARS-CoV-2 target nucleic acids are NOT DETECTED.  The SARS-CoV-2 RNA is generally detectable in upper and lower respiratory specimens during the acute phase of infection. The lowest concentration of SARS-CoV-2 viral copies this assay can detect is 250 copies / mL. A negative result does not preclude SARS-CoV-2 infection and should not be used as the sole basis for treatment or other patient management decisions.  A negative result may occur with improper specimen collection / handling, submission of specimen other than nasopharyngeal swab, presence of viral mutation(s) within the areas targeted by this assay, and inadequate number of viral copies (<250 copies / mL). A negative result must be combined with clinical observations, patient history, and epidemiological information.  Fact Sheet for Patients:   RoadLapTop.co.za  Fact Sheet for Healthcare Providers: http://kim-miller.com/  This test is not yet approved or  cleared by the [place] FDA and has been authorized for detection and/or diagnosis of SARS-CoV-2 by FDA under an Emergency Use Authorization (EUA).  This EUA will remain in effect (meaning this test can be used) for the duration of the COVID-19 declaration under Section 564(b)(1) of the Act, 21 U.S.C. section  360bbb-3(b)(1), unless the authorization is terminated or revoked sooner.  Performed at [facility], [address] [place], [place] [address]   Culture, blood (Routine X 2) w Reflex to ID Panel     Status: None   Collection Time: 07/07/23 10:46 PM   Specimen: BLOOD  Result Value Ref Range Status   Specimen Description BLOOD  LAC  Final   Special Requests  Final    BOTTLES DRAWN AEROBIC AND ANAEROBIC Blood Culture adequate volume   Culture   Final    NO GROWTH 5 DAYS Performed at [facility], [address] [place], [place] [address]    Report Status 07/12/2023 FINAL  Final  MRSA Next Gen by PCR, Nasal     Status: None   Collection Time: 07/07/23 11:50 PM   Specimen: Nasal Mucosa; Nasal Swab  Result Value Ref Range Status   MRSA by PCR Next Gen NOT DETECTED NOT DETECTED Final    Comment: (NOTE) The GeneXpert MRSA Assay (FDA approved for NASAL specimens only), is one component of a comprehensive MRSA colonization surveillance program. It is not intended to diagnose MRSA infection nor to guide or monitor treatment for MRSA infections. Test performance is not FDA approved in patients less than 57 years old. Performed at [facility], [address] [place], [place] [address]   Aerobic/Anaerobic Culture w Gram Stain (surgical/deep wound)     Status: None (Preliminary result)   Collection Time: 07/09/23  5:01 PM   Specimen: Foot, Left; Wound  Result Value Ref Range Status   Specimen Description WOUND FOOT  Final   Special Requests SWAB SAMPLE A  Final   Gram Stain   Final    FEW WBC PRESENT, PREDOMINANTLY PMN NO ORGANISMS SEEN    Culture   Final    RARE DIPHTHEROIDS(CORYNEBACTERIUM SPECIES) Standardized susceptibility testing for this organism is not available. RARE STREPTOCOCCUS MITIS/ORALIS SUSCEPTIBILITIES TO FOLLOW Performed at [facility], [address], [place], [place] [address]    Report Status PENDING  Incomplete   Aerobic/Anaerobic Culture w Gram Stain (surgical/deep wound)     Status: None   Collection Time: 07/09/23  5:17 PM   Specimen: Foot, Left; Wound  Result Value Ref Range Status   Specimen Description BONE LEFT FOOT  Final   Special Requests 1ST METATARSAL SAMPLE B  Final   Gram Stain NO WBC SEEN NO ORGANISMS SEEN   Final   Culture   Final    RARE FINEGOLDIA MAGNA Standardized susceptibility testing for this organism is not available. Performed at [facility], [address], [place], [place] [address]    Report Status 07/12/2023 FINAL  Final  Aerobic/Anaerobic Culture w Gram Stain (surgical/deep wound)     Status: None (Preliminary result)   Collection Time: 07/09/23  5:22 PM   Specimen: Foot, Left; Wound  Result Value Ref Range Status   Specimen Description BONE LEFT FOOT 1ST CUNEIFORM  Final   Special Requests NONE  Final   Gram Stain NO WBC SEEN NO ORGANISMS SEEN   Final   Culture   Final    NO GROWTH 3 DAYS NO ANAEROBES ISOLATED; CULTURE IN PROGRESS FOR 5 DAYS Performed at [facility], [address], [place], [place] [address]    Report Status PENDING  Incomplete    Coagulation Studies: No results for input(s): "LABPROT", "INR" in the last 72 hours.   Urinalysis: No results for input(s): "COLORURINE", "LABSPEC", "PHURINE", "GLUCOSEU", "HGBUR", "BILIRUBINUR", "KETONESUR", "PROTEINUR", "UROBILINOGEN", "NITRITE", "LEUKOCYTESUR" in the last 72 hours.  Invalid input(s): "APPERANCEUR"     Imaging: No results found.   Medications:    sodium chloride Stopped (07/11/23 0017)   ceFEPime (MAXIPIME) IV 1 g (07/11/23 2100)   promethazine (PHENERGAN) injection (IM or IVPB)     [START ON 07/13/2023] vancomycin     vancomycin      amLODipine  10  mg Oral Daily   atorvastatin  80 mg Oral Daily   Chlorhexidine Gluconate Cloth  6 each Topical Daily   epoetin (EPOGEN/PROCRIT) injection  10,000 Units Intravenous Q M,W,F-HD   heparin injection (subcutaneous)   5,000 Units Subcutaneous Q8H   insulin aspart  0-9 Units Subcutaneous Q4H   insulin glargine-yfgn  14 Units Subcutaneous Q24H   losartan  100 mg Oral Daily   metoCLOPramide (REGLAN) injection  10 mg Intravenous Q6H   montelukast  10 mg Oral QHS   multivitamin  1 tablet Oral QHS   norethindrone-ethinyl estradiol  1 tablet Oral Daily   polyethylene glycol  17 g Oral Daily   sevelamer carbonate  1,600 mg Oral TID with meals   sodium bicarbonate  650 mg Oral BID   Vitamin D (Ergocalciferol)  50,000 Units Oral Q7 days   sodium chloride, acetaminophen **OR** acetaminophen, dextrose, dextrose, HYDROmorphone (DILAUDID) injection, magnesium hydroxide, ondansetron **OR** ondansetron (ZOFRAN) IV, oxyCODONE-acetaminophen, promethazine (PHENERGAN) injection (IM or IVPB), sevelamer carbonate, SUMAtriptan, traZODone  Assessment/ Plan:  [name] is a 27 y.o.  female with past medical history of diabetes, anemia, and diabetes type 1, and end stage renal disease on peritoneal dialysis. Patient presents to the ED with fever and body aches. She has been admitted for DKA (diabetic ketoacidosis) (HCC) [E11.10] ESRD on hemodialysis (HCC) [N18.6, Z99.2] Diabetic ketoacidosis without coma associated with type 1 diabetes mellitus (HCC) [E10.10] Osteomyelitis of left foot, unspecified type (HCC) [M86.9] Pseudohyponatremia [R79.89]  End stage renal disease: on home hemodialysis  - Receiving dialysis today, UF goal 1.5L as tolerated - Will maintain inpatient schedule of MWF.    2. Anemia of chronic kidney disease Lab Results  Component Value Date   HGB 8.6 (L) 07/12/2023    - Hgb below desired range - Continue EPO with dialysis treatments  3. Secondary Hyperparathyroidism: with hypocalcemia and hyperphosphatemia.    Lab Results  Component Value Date   CALCIUM 7.3 (L) 07/12/2023   CAION 0.97 (L) 01/08/2023   PHOS 5.6 (H) 07/10/2023    - Calcium improving, prescribed ergocalciferol outpatient.   - Continue Sevelamer with meals.   4. Diabetes mellitus type II with chronic kidney disease: insulin dependent. Most recent hemoglobin A1c is 8.5 on 06/18/23.   - with DKA on admission  - Insulin gtt stopped on 07/10/23   - glucose levels acceptable, primary team to manage SSI  5. Sepsis secondary to left foot abscess. 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 I&D on 8/23 for abscess.   - Continue empiric cefepime and vancomycin  6. Hypertension: urgency on admission.   - losartan and amlodipine  - hold bumetanide.   - Blood pressure 152/96 during dialysis   LOS: 2   8/26/202411:22 AM

## 2023-07-12 NOTE — Progress Notes (Signed)
   07/11/23 2105  Provider Notification  Provider Name/Title Dr. Arville Care  Date Provider Notified 07/11/23  Time Provider Notified 2105  Method of Notification Page  Notification Reason Other (Comment) (pt here for DKA, she had a 9 beat run of Vtach, no complaints of chest pain at this time)  Provider response See new orders  Date of Provider Response 07/11/23  Time of Provider Response 2105

## 2023-07-12 NOTE — TOC Progression Note (Signed)
Transition of Care [facility]) - Progression Note    Patient Details  Name: Emma Thomas MRN: [id number] Date of Birth: [birth year]  [facility] of [facility] CM/SW Contact  Marlowe Sax, RN Phone Number: 07/12/2023, 1:31 PM  Clinical Narrative:    The patient is from home, she does home Dialysis and will need IV ABX, waiting to see if Nephrology will arrange Outpatient Dialysis so that the IV ABX will be delivered with it.  TOC will continue to follow and assist with DC plan and needs May need HH set up        Expected Discharge Plan and Services                                               Social Determinants of Health (SDOH) Interventions SDOH Screenings   Food Insecurity: No Food Insecurity (07/07/2023)  Housing: Low Risk  (07/07/2023)  Transportation Needs: No Transportation Needs (07/07/2023)  Utilities: Not At Risk (07/07/2023)  Financial Resource Strain: Low Risk  (05/31/2023)   Received from [facility]  Tobacco Use: Low Risk  (07/09/2023)    Readmission Risk Interventions    07/09/2023   11:05 AM 06/21/2023    1:28 PM 11/21/2022   11:46 AM  Readmission Risk Prevention Plan  Transportation Screening Complete Complete Complete  PCP or Specialist Appt within 3-5 Days  Complete Complete  HRI or Home Care Consult  Patient refused Complete  Social Work Consult for Recovery Care Planning/Counseling  Complete Complete  Palliative Care Screening  Not Applicable Not Applicable  Medication Review Oceanographer) Complete Complete Complete  PCP or Specialist appointment within 3-5 days of discharge Complete    HRI or Home Care Consult Not Complete    HRI or Home Care Consult Pt Refusal Comments pt stated she does not have home care at this time and did not want home care.    SW Recovery Care/Counseling Consult Complete    Palliative Care Screening Not Applicable    Skilled Nursing Facility Not Applicable

## 2023-07-12 NOTE — Progress Notes (Signed)
PODIATRY / FOOT AND ANKLE SURGERY PROGRESS NOTE  Reason for consult: Left foot infection  Chief Complaint: Left foot pain/infection   HPI: Emma Thomas is a 27 y.o. female who presents status post 3 days after going a left foot incision and drainage with removal of a portion of the first metatarsal and medial cuneiform with excision of wound with closure.  Patient has her dressing clean and dry today.  She has been trying to stay off the foot is much as possible.  She notes that she still has pain to the area but is doing better today.  PMHx:  Past Medical History:  Diagnosis Date   Anemia    Diabetes mellitus without complication (HCC)    Type 1 DM   Essential hypertension    Headache    Hypertension 03/04/2013   Neurologic disorder    Both feet   Neuromuscular disorder (HCC)    Recurrent UTI    Renal disorder     Surgical Hx:  Past Surgical History:  Procedure Laterality Date   abscess removal     excision of bartholin cyst   AMPUTATION Left 11/23/2022   Procedure: LEFT GREAT TOE PARTIAL RAY AMPUTATION;  Surgeon: Linus Galas, DPM;  Location: [facility];  Service: Podiatry;  Laterality: Left;   AMPUTATION Left 07/09/2023   Procedure: AMPUTATION First Ray;  Surgeon: Rosetta Posner, DPM;  Location: [facility];  Service: Orthopedics/Podiatry;  Laterality: Left;   CAPD INSERTION N/A 06/09/2022   Procedure: LAPAROSCOPIC INSERTION CONTINUOUS AMBULATORY PERITONEAL DIALYSIS  (CAPD) CATHETER, PD rep to be present;  Surgeon: Henrene Dodge, MD;  Location: [facility];  Service: General;  Laterality: N/A;   CAPD INSERTION N/A 12/15/2022   Procedure: LAPAROSCOPIC INSERTION CONTINUOUS AMBULATORY PERITONEAL DIALYSIS  (CAPD) CATHETER;  Surgeon: Henrene Dodge, MD;  Location: [facility];  Service: General;  Laterality: N/A;   CAPD REMOVAL N/A 12/15/2022   Procedure: LAPAROSCOPIC REMOVAL CONTINUOUS AMBULATORY PERITONEAL DIALYSIS  (CAPD) CATHETER, removal of old catheter;  Surgeon: Henrene Dodge, MD;   Location: [facility];  Service: General;  Laterality: N/A;   CAPD REMOVAL N/A 01/08/2023   Procedure: LAPAROSCOPIC REMOVAL CONTINUOUS AMBULATORY PERITONEAL DIALYSIS  (CAPD) CATHETER;  Surgeon: Henrene Dodge, MD;  Location: [facility];  Service: General;  Laterality: N/A;   CAPD REVISION N/A 07/16/2022   Procedure: LAPAROSCOPIC REVISION CONTINUOUS AMBULATORY PERITONEAL DIALYSIS  (CAPD) CATHETER;  Surgeon: Henrene Dodge, MD;  Location: [facility];  Service: General;  Laterality: N/A;   CAPD REVISION N/A 11/24/2022   Procedure: LAPAROSCOPIC REVISION CONTINUOUS AMBULATORY PERITONEAL DIALYSIS  (CAPD) CATHETER;  Surgeon: Henrene Dodge, MD;  Location: [facility];  Service: General;  Laterality: N/A;   DIALYSIS/PERMA CATHETER INSERTION N/A 04/24/2022   Procedure: DIALYSIS/PERMA CATHETER INSERTION;  Surgeon: Renford Dills, MD;  Location: [facility];  Service: Cardiovascular;  Laterality: N/A;   DIALYSIS/PERMA CATHETER REMOVAL N/A 06/02/2022   Procedure: DIALYSIS/PERMA CATHETER REMOVAL;  Surgeon: Renford Dills, MD;  Location: [facility];  Service: Cardiovascular;  Laterality: N/A;   DIALYSIS/PERMA CATHETER REMOVAL N/A 12/28/2022   Procedure: DIALYSIS/PERMA CATHETER REMOVAL;  Surgeon: Annice Needy, MD;  Location: [facility];  Service: Cardiovascular;  Laterality: N/A;   INSERTION OF DIALYSIS CATHETER Right    ARM   IR FLUORO GUIDE CV LINE RIGHT  11/27/2022   IR US GUIDE VASC ACCESS RIGHT  11/27/2022   IRRIGATION AND DEBRIDEMENT FOOT Left 06/19/2023   Procedure: IRRIGATION AND DEBRIDEMENT FOOT PARTIAL 1ST RAY AMPUTATION;  Surgeon:  Rosetta Posner, DPM;  Location: [facility];  Service: Orthopedics/Podiatry;  Laterality: Left;   IRRIGATION AND DEBRIDEMENT FOOT Left 07/09/2023   Procedure: IRRIGATION AND DEBRIDEMENT FOOT;  Surgeon: Rosetta Posner, DPM;  Location: [facility];  Service: Orthopedics/Podiatry;  Laterality: Left;   Nexplanon  01/2011   UMBILICAL HERNIA REPAIR N/A 06/09/2022    Procedure: HERNIA REPAIR UMBILICAL ADULT;  Surgeon: Henrene Dodge, MD;  Location: [facility];  Service: General;  Laterality: N/A;    FHx:  Family History  Problem Relation Age of Onset   Breast cancer Mother 64   Lung cancer Maternal Grandmother    Diabetes type II Paternal Grandmother     Social History:  reports that she has never smoked. She has been exposed to tobacco smoke. She has never used smokeless tobacco. She reports that she does not currently use alcohol after a past usage of about 2.0 standard drinks of alcohol per week. She reports that she does not use drugs.  Allergies: No Known Allergies  Medications Prior to Admission  Medication Sig Dispense Refill   amLODipine (NORVASC) 10 MG tablet Take 1 tablet (10 mg total) by mouth daily. 30 tablet 0   atorvastatin (LIPITOR) 80 MG tablet Take 80 mg by mouth daily.     bumetanide (BUMEX) 1 MG tablet Take 1 mg by mouth 2 (two) times daily.     furosemide (LASIX) 80 MG tablet Take 80 mg by mouth daily.     HUMALOG 100 UNIT/ML injection Inject 0-1 mLs (0-100 Units total) into the skin daily. Uses with Insulin Pump 10 mL 11   losartan (COZAAR) 100 MG tablet Take 100 mg by mouth daily.     montelukast (SINGULAIR) 10 MG tablet Take 10 mg by mouth at bedtime.     multivitamin (RENA-VIT) TABS tablet Take 1 tablet by mouth at bedtime. 30 tablet 2   ondansetron (ZOFRAN) 8 MG tablet Take 8 mg by mouth daily.     sevelamer carbonate (RENVELA) 800 MG tablet Take 800-1,600 mg by mouth See admin instructions. Take 2 tablets (1600mg ) by mouth three times daily with meals and take 1 tablet (800mg ) by mouth daily with snacks     acetaminophen (TYLENOL) 500 MG tablet Take 2 tablets (1,000 mg total) by mouth every 6 (six) hours as needed for mild pain.     norethindrone-ethinyl estradiol (LOESTRIN) 1-20 MG-MCG tablet Take 1 tablet by mouth daily.     oxyCODONE-acetaminophen (PERCOCET/ROXICET) 5-325 MG tablet Take 1 tablet by mouth every 6 (six) hours  as needed.     SUMAtriptan (IMITREX) 50 MG tablet Take 50 mg by mouth daily as needed for migraine.     Vitamin D, Ergocalciferol, (DRISDOL) 1.25 MG (50000 UNIT) CAPS capsule Take 1 capsule (50,000 Units total) by mouth every 7 (seven) days. 12 capsule 0    Physical Exam: General: Alert and oriented.  No apparent distress.  Vascular: DP/PT pulses palpable bilateral, capillary fill time intact to digits bilaterally, minimal hair growth noted to digits of bilateral lower extremities.  Overall appears to have mild edema present to the left foot but no erythema.  Neuro: Light touch sensation reduced to nearly absent to bilateral lower extremities.  Derm: Incision lines appear to be well coapted with sutures intact, packing intact to the foot.  No active drainage on the dressing or when examining the foot.  After packing was removed did appear to have some mild bloody fluid that was able to be exsanguinated.  No purulence expressed.  MSK:  Left partial first ray amputation including first metatarsal in its entirety and a portion of the medial cuneiform.  Results for orders placed or performed during the hospital encounter of 07/10/23 (from the past 48 hour(s))  Glucose, capillary     Status: Abnormal   Collection Time: 07/10/23  1:03 PM  Result Value Ref Range   Glucose-Capillary 141 (H) 70 - 99 mg/dL    Comment: Glucose reference range applies only to samples taken after fasting for at least 8 hours.  Basic metabolic panel     Status: Abnormal   Collection Time: 07/10/23  1:36 PM  Result Value Ref Range   Sodium 134 (L) 135 - 145 mmol/L   Potassium 3.9 3.5 - 5.1 mmol/L   Chloride 99 98 - 111 mmol/L   CO2 20 (L) 22 - 32 mmol/L   Glucose, Bld 136 (H) 70 - 99 mg/dL    Comment: Glucose reference range applies only to samples taken after fasting for at least 8 hours.   BUN 54 (H) 6 - 20 mg/dL   Creatinine, Ser 3.66 (H) 0.44 - 1.00 mg/dL   Calcium 6.8 (L) 8.9 - 10.3 mg/dL   GFR, Estimated 7  (L) >60 mL/min    Comment: (NOTE) Calculated using the CKD-EPI Creatinine Equation (2021)    Anion gap 15 5 - 15    Comment: Performed at [facility], [address] [place], [place] [address]  Beta-hydroxybutyric acid     Status: None   Collection Time: 07/10/23  1:36 PM  Result Value Ref Range   Beta-Hydroxybutyric Acid 0.13 0.05 - 0.27 mmol/L    Comment: Performed at [facility], [address] [place], [place] [address]  Glucose, capillary     Status: Abnormal   Collection Time: 07/10/23  2:08 PM  Result Value Ref Range   Glucose-Capillary 134 (H) 70 - 99 mg/dL    Comment: Glucose reference range applies only to samples taken after fasting for at least 8 hours.  Glucose, capillary     Status: Abnormal   Collection Time: 07/10/23  3:11 PM  Result Value Ref Range   Glucose-Capillary 137 (H) 70 - 99 mg/dL    Comment: Glucose reference range applies only to samples taken after fasting for at least 8 hours.  Glucose, capillary     Status: Abnormal   Collection Time: 07/10/23  4:08 PM  Result Value Ref Range   Glucose-Capillary 130 (H) 70 - 99 mg/dL    Comment: Glucose reference range applies only to samples taken after fasting for at least 8 hours.  Glucose, capillary     Status: Abnormal   Collection Time: 07/10/23  5:05 PM  Result Value Ref Range   Glucose-Capillary 146 (H) 70 - 99 mg/dL    Comment: Glucose reference range applies only to samples taken after fasting for at least 8 hours.  Basic metabolic panel     Status: Abnormal   Collection Time: 07/10/23  5:56 PM  Result Value Ref Range   Sodium 132 (L) 135 - 145 mmol/L   Potassium 3.8 3.5 - 5.1 mmol/L   Chloride 97 (L) 98 - 111 mmol/L   CO2 20 (L) 22 - 32 mmol/L   Glucose, Bld 147 (H) 70 - 99 mg/dL    Comment: Glucose reference range applies only to samples taken after fasting for at least 8 hours.   BUN 56 (H) 6 - 20 mg/dL   Creatinine, Ser 5.63 (H) 0.44 - 1.00 mg/dL  Calcium 6.6 (L) 8.9 -  10.3 mg/dL   GFR, Estimated 7 (L) >60 mL/min    Comment: (NOTE) Calculated using the CKD-EPI Creatinine Equation (2021)    Anion gap 15 5 - 15    Comment: Performed at [facility], [address] [place], [place] [address]  Glucose, capillary     Status: Abnormal   Collection Time: 07/10/23  6:10 PM  Result Value Ref Range   Glucose-Capillary 150 (H) 70 - 99 mg/dL    Comment: Glucose reference range applies only to samples taken after fasting for at least 8 hours.  Glucose, capillary     Status: Abnormal   Collection Time: 07/10/23  6:58 PM  Result Value Ref Range   Glucose-Capillary 131 (H) 70 - 99 mg/dL    Comment: Glucose reference range applies only to samples taken after fasting for at least 8 hours.  Glucose, capillary     Status: Abnormal   Collection Time: 07/10/23  7:54 PM  Result Value Ref Range   Glucose-Capillary 149 (H) 70 - 99 mg/dL    Comment: Glucose reference range applies only to samples taken after fasting for at least 8 hours.  Glucose, capillary     Status: Abnormal   Collection Time: 07/10/23  8:50 PM  Result Value Ref Range   Glucose-Capillary 155 (H) 70 - 99 mg/dL    Comment: Glucose reference range applies only to samples taken after fasting for at least 8 hours.  Basic metabolic panel     Status: Abnormal   Collection Time: 07/10/23  9:23 PM  Result Value Ref Range   Sodium 133 (L) 135 - 145 mmol/L   Potassium 3.6 3.5 - 5.1 mmol/L   Chloride 100 98 - 111 mmol/L   CO2 21 (L) 22 - 32 mmol/L   Glucose, Bld 158 (H) 70 - 99 mg/dL    Comment: Glucose reference range applies only to samples taken after fasting for at least 8 hours.   BUN 54 (H) 6 - 20 mg/dL   Creatinine, Ser 2.95 (H) 0.44 - 1.00 mg/dL   Calcium 6.5 (L) 8.9 - 10.3 mg/dL   GFR, Estimated 7 (L) >60 mL/min    Comment: (NOTE) Calculated using the CKD-EPI Creatinine Equation (2021)    Anion gap 12 5 - 15    Comment: Performed at [facility], [address]  [place], [place] [address]  Beta-hydroxybutyric acid     Status: None   Collection Time: 07/10/23  9:23 PM  Result Value Ref Range   Beta-Hydroxybutyric Acid 0.09 0.05 - 0.27 mmol/L    Comment: Performed at [facility], [address] [place], [place] [address]  Renal function panel     Status: Abnormal   Collection Time: 07/10/23  9:23 PM  Result Value Ref Range   Sodium 132 (L) 135 - 145 mmol/L   Potassium 3.5 3.5 - 5.1 mmol/L   Chloride 99 98 - 111 mmol/L   CO2 19 (L) 22 - 32 mmol/L   Glucose, Bld 155 (H) 70 - 99 mg/dL    Comment: Glucose reference range applies only to samples taken after fasting for at least 8 hours.   BUN 54 (H) 6 - 20 mg/dL   Creatinine, Ser 4.69 (H) 0.44 - 1.00 mg/dL   Calcium 6.5 (L) 8.9 - 10.3 mg/dL   Phosphorus 5.6 (H) 2.5 - 4.6 mg/dL   Albumin 2.4 (L) 3.5 - 5.0 g/dL   GFR, Estimated 6 (L) >60 mL/min  Comment: (NOTE) Calculated using the CKD-EPI Creatinine Equation (2021)    Anion gap 14 5 - 15    Comment: Performed at [facility], [address] [place], [place] [address]  CBC     Status: Abnormal   Collection Time: 07/10/23  9:23 PM  Result Value Ref Range   WBC 12.1 (H) 4.0 - 10.5 K/uL   RBC 2.63 (L) 3.87 - 5.11 MIL/uL   Hemoglobin 7.8 (L) 12.0 - 15.0 g/dL   HCT 19.1 (L) 47.8 - 29.5 %   MCV 88.6 80.0 - 100.0 fL   MCH 29.7 26.0 - 34.0 pg   MCHC 33.5 30.0 - 36.0 g/dL   RDW 62.1 30.8 - 65.7 %   Platelets 260 150 - 400 K/uL   nRBC 0.0 0.0 - 0.2 %    Comment: Performed at [facility], [address] [place], [place] [address]  Glucose, capillary     Status: Abnormal   Collection Time: 07/10/23  9:58 PM  Result Value Ref Range   Glucose-Capillary 166 (H) 70 - 99 mg/dL    Comment: Glucose reference range applies only to samples taken after fasting for at least 8 hours.  Glucose, capillary     Status: Abnormal   Collection Time: 07/10/23 11:56 PM  Result Value Ref Range   Glucose-Capillary 139 (H) 70 - 99 mg/dL     Comment: Glucose reference range applies only to samples taken after fasting for at least 8 hours.  Glucose, capillary     Status: Abnormal   Collection Time: 07/11/23 12:50 AM  Result Value Ref Range   Glucose-Capillary 133 (H) 70 - 99 mg/dL    Comment: Glucose reference range applies only to samples taken after fasting for at least 8 hours.  Glucose, capillary     Status: Abnormal   Collection Time: 07/11/23  1:59 AM  Result Value Ref Range   Glucose-Capillary 128 (H) 70 - 99 mg/dL    Comment: Glucose reference range applies only to samples taken after fasting for at least 8 hours.  Glucose, capillary     Status: None   Collection Time: 07/11/23  4:36 AM  Result Value Ref Range   Glucose-Capillary 92 70 - 99 mg/dL    Comment: Glucose reference range applies only to samples taken after fasting for at least 8 hours.  Magnesium     Status: None   Collection Time: 07/11/23  4:41 AM  Result Value Ref Range   Magnesium 2.2 1.7 - 2.4 mg/dL    Comment: Performed at [facility], [address] [place], [place] [address]  CBC     Status: Abnormal   Collection Time: 07/11/23  4:45 AM  Result Value Ref Range   WBC 10.4 4.0 - 10.5 K/uL   RBC 2.84 (L) 3.87 - 5.11 MIL/uL   Hemoglobin 8.3 (L) 12.0 - 15.0 g/dL   HCT 41.3 (L) 24.4 - 01.0 %   MCV 88.7 80.0 - 100.0 fL   MCH 29.2 26.0 - 34.0 pg   MCHC 32.9 30.0 - 36.0 g/dL   RDW 27.2 53.6 - 64.4 %   Platelets 280 150 - 400 K/uL   nRBC 0.0 0.0 - 0.2 %    Comment: Performed at [facility], [address] [place], [place] [address]  Basic metabolic panel     Status: Abnormal   Collection Time: 07/11/23  4:45 AM  Result Value Ref Range   Sodium 133 (L) 135 - 145 mmol/L  Potassium 3.3 (L) 3.5 - 5.1 mmol/L   Chloride 101 98 - 111 mmol/L   CO2 19 (L) 22 - 32 mmol/L   Glucose, Bld 90 70 - 99 mg/dL    Comment: Glucose reference range applies only to samples taken after fasting for at least 8 hours.   BUN 54 (H) 6 - 20 mg/dL    Creatinine, Ser 8.29 (H) 0.44 - 1.00 mg/dL   Calcium 6.7 (L) 8.9 - 10.3 mg/dL   GFR, Estimated 7 (L) >60 mL/min    Comment: (NOTE) Calculated using the CKD-EPI Creatinine Equation (2021)    Anion gap 13 5 - 15    Comment: Performed at [facility], [address] [place], [place] [address]  Glucose, capillary     Status: Abnormal   Collection Time: 07/11/23  8:51 AM  Result Value Ref Range   Glucose-Capillary 101 (H) 70 - 99 mg/dL    Comment: Glucose reference range applies only to samples taken after fasting for at least 8 hours.  Glucose, capillary     Status: Abnormal   Collection Time: 07/11/23 11:53 AM  Result Value Ref Range   Glucose-Capillary 148 (H) 70 - 99 mg/dL    Comment: Glucose reference range applies only to samples taken after fasting for at least 8 hours.  Glucose, capillary     Status: Abnormal   Collection Time: 07/11/23  5:44 PM  Result Value Ref Range   Glucose-Capillary 163 (H) 70 - 99 mg/dL    Comment: Glucose reference range applies only to samples taken after fasting for at least 8 hours.  Glucose, capillary     Status: Abnormal   Collection Time: 07/11/23  8:01 PM  Result Value Ref Range   Glucose-Capillary 151 (H) 70 - 99 mg/dL    Comment: Glucose reference range applies only to samples taken after fasting for at least 8 hours.   Comment 1 Notify RN   Glucose, capillary     Status: Abnormal   Collection Time: 07/12/23 12:15 AM  Result Value Ref Range   Glucose-Capillary 143 (H) 70 - 99 mg/dL    Comment: Glucose reference range applies only to samples taken after fasting for at least 8 hours.   Comment 1 Notify RN   Glucose, capillary     Status: Abnormal   Collection Time: 07/12/23  4:33 AM  Result Value Ref Range   Glucose-Capillary 122 (H) 70 - 99 mg/dL    Comment: Glucose reference range applies only to samples taken after fasting for at least 8 hours.   Comment 1 Notify RN   Basic metabolic panel     Status: Abnormal   Collection  Time: 07/12/23  4:43 AM  Result Value Ref Range   Sodium 136 135 - 145 mmol/L   Potassium 4.1 3.5 - 5.1 mmol/L   Chloride 102 98 - 111 mmol/L   CO2 18 (L) 22 - 32 mmol/L   Glucose, Bld 134 (H) 70 - 99 mg/dL    Comment: Glucose reference range applies only to samples taken after fasting for at least 8 hours.   BUN 51 (H) 6 - 20 mg/dL   Creatinine, Ser 0.86 (H) 0.44 - 1.00 mg/dL   Calcium 7.3 (L) 8.9 - 10.3 mg/dL   GFR, Estimated 6 (L) >60 mL/min    Comment: (NOTE) Calculated using the CKD-EPI Creatinine Equation (2021)    Anion gap 16 (H) 5 - 15    Comment: Performed at [facility], [address] [place], [place] [address]  Glucose, capillary     Status: Abnormal   Collection Time: 07/12/23  8:13 AM  Result Value Ref Range   Glucose-Capillary 52 (L) 70 - 99 mg/dL    Comment: Glucose reference range applies only to samples taken after fasting for at least 8 hours.  Glucose, capillary     Status: Abnormal   Collection Time: 07/12/23  8:57 AM  Result Value Ref Range   Glucose-Capillary 132 (H) 70 - 99 mg/dL    Comment: Glucose reference range applies only to samples taken after fasting for at least 8 hours.  CBC     Status: Abnormal   Collection Time: 07/12/23 10:11 AM  Result Value Ref Range   WBC 6.3 4.0 - 10.5 K/uL   RBC 2.97 (L) 3.87 - 5.11 MIL/uL   Hemoglobin 8.6 (L) 12.0 - 15.0 g/dL   HCT 02.7 (L) 25.3 - 66.4 %   MCV 87.9 80.0 - 100.0 fL   MCH 29.0 26.0 - 34.0 pg   MCHC 33.0 30.0 - 36.0 g/dL   RDW 40.3 47.4 - 25.9 %   Platelets 259 150 - 400 K/uL   nRBC 0.0 0.0 - 0.2 %    Comment: Performed at [facility], [address] [place], [place] [address]   No results found.  Blood pressure (!) 189/99, pulse 95, temperature 98.1 F (36.7 C), temperature source Oral, resp. rate 15, height 5\' 6"  (1.676 m), weight 76.5 kg, last menstrual period 06/14/2023, SpO2 100%.  Assessment DKA History of uncontrolled type 1 diabetes Possible osteomyelitis to the  left first ray with abscess status post I&D with bone resection End-stage renal disease on dialysis Hypertensive urgency Chronic anemia  Plan -Patient seen and examined. -Incision site appears to be well coapted with sutures intact at the 2 procedural areas.  Packing was removed today and mild amount of serous sanguinous fluid was able to be exsanguinated from the packing sites.  Hemostasis appeared to be well-controlled. -Reapplied Xeroform to the incisions followed by 4 x 4 gauze, ABD, Kerlix, Coban with moderate compression.  Patient can keep dressing on and keep it clean and dry for the next week.  If going to be not seen in clinic for a prolonged period of time would recommend changing at least once a week. -Wound swab growing corynebacterium, strep mitis.  First metatarsal bone culture growing Finegoldia magna.  Medial cuneiform bone culture not growing any bacteria at this time.  Pathology still pending.  Bone was very soft at the first metatarsal and distal medial cuneiform consistent with likely osteomyelitis.  Unsure if resected margin will be clear of infection.  Previous culture growing strep mitis/oralis and finegoldia magna from last surgery as well.  Only sensitive to vancomycin.  Appreciate infectious disease recommendations for further antibiotic therapy.  Likely would recommend staying on a prolonged course of IV antibiotics due to recurrent infection. -Recommend still keeping off the left foot is much as possible but may have gentle heel contact for short distances in postop shoe.  Podiatry team to monitor peripherally at this time.  When deemed medically stable patient can be discharged pending resolution of antibiotic course and other medical issues.  Rosetta Posner, DPM 07/12/2023, 12:29 PM

## 2023-07-13 ENCOUNTER — Emergency Department
Admission: EM | Admit: 2023-07-13 | Discharge: 2023-07-13 | Disposition: A | Discharge status: Home / Self Care | Payer: 59 | Attending: Emergency Medicine | Admitting: Emergency Medicine

## 2023-07-13 ENCOUNTER — Other Ambulatory Visit: Payer: Self-pay

## 2023-07-13 DIAGNOSIS — Z992 Dependence on renal dialysis: Secondary | ICD-10-CM | POA: Diagnosis not present

## 2023-07-13 DIAGNOSIS — N186 End stage renal disease: Secondary | ICD-10-CM | POA: Insufficient documentation

## 2023-07-13 DIAGNOSIS — R7989 Other specified abnormal findings of blood chemistry: Secondary | ICD-10-CM

## 2023-07-13 DIAGNOSIS — E1122 Type 2 diabetes mellitus with diabetic chronic kidney disease: Secondary | ICD-10-CM | POA: Insufficient documentation

## 2023-07-13 DIAGNOSIS — E1010 Type 1 diabetes mellitus with ketoacidosis without coma: Secondary | ICD-10-CM | POA: Diagnosis not present

## 2023-07-13 DIAGNOSIS — I120 Hypertensive chronic kidney disease with stage 5 chronic kidney disease or end stage renal disease: Secondary | ICD-10-CM | POA: Insufficient documentation

## 2023-07-13 DIAGNOSIS — T783XXA Angioneurotic edema, initial encounter: Secondary | ICD-10-CM | POA: Insufficient documentation

## 2023-07-13 DIAGNOSIS — E785 Hyperlipidemia, unspecified: Secondary | ICD-10-CM

## 2023-07-13 DIAGNOSIS — Z79899 Other long term (current) drug therapy: Secondary | ICD-10-CM | POA: Insufficient documentation

## 2023-07-13 DIAGNOSIS — M869 Osteomyelitis, unspecified: Secondary | ICD-10-CM | POA: Diagnosis not present

## 2023-07-13 LAB — BASIC METABOLIC PANEL
Anion gap: 9 (ref 5–15)
Anion gap: 21 — ABNORMAL HIGH (ref 5–15)
BUN: 22 mg/dL — ABNORMAL HIGH (ref 6–20)
BUN: 26 mg/dL — ABNORMAL HIGH (ref 6–20)
CO2: 26 mmol/L (ref 22–32)
CO2: 16 mmol/L — ABNORMAL LOW (ref 22–32)
Calcium: 7.5 mg/dL — ABNORMAL LOW (ref 8.9–10.3)
Calcium: 8.2 mg/dL — ABNORMAL LOW (ref 8.9–10.3)
Chloride: 101 mmol/L (ref 98–111)
Chloride: 96 mmol/L — ABNORMAL LOW (ref 98–111)
Creatinine, Ser: 4.95 mg/dL — ABNORMAL HIGH (ref 0.44–1.00)
Creatinine, Ser: 5.97 mg/dL — ABNORMAL HIGH (ref 0.44–1.00)
GFR, Estimated: 12 mL/min — ABNORMAL LOW (ref >60)
GFR, Estimated: 9 mL/min — ABNORMAL LOW (ref >60)
Glucose, Bld: 84 mg/dL (ref 70–99)
Glucose, Bld: 403 mg/dL — ABNORMAL HIGH (ref 70–99)
Potassium: 3.4 mmol/L — ABNORMAL LOW (ref 3.5–5.1)
Potassium: 3.9 mmol/L (ref 3.5–5.1)
Sodium: 136 mmol/L (ref 135–145)
Sodium: 133 mmol/L — ABNORMAL LOW (ref 135–145)

## 2023-07-13 LAB — HEPATITIS B SURFACE ANTIBODY, QUANTITATIVE: Hep B S AB Quant (Post): 114 m[IU]/mL

## 2023-07-13 LAB — GLUCOSE, CAPILLARY
Glucose-Capillary: 121 mg/dL — ABNORMAL HIGH (ref 70–99)
Glucose-Capillary: 149 mg/dL — ABNORMAL HIGH (ref 70–99)
Glucose-Capillary: 72 mg/dL (ref 70–99)
Glucose-Capillary: 79 mg/dL (ref 70–99)

## 2023-07-13 LAB — CBC
HCT: 30.8 % — ABNORMAL LOW (ref 36.0–46.0)
Hemoglobin: 10.1 g/dL — ABNORMAL LOW (ref 12.0–15.0)
MCH: 29.2 pg (ref 26.0–34.0)
MCHC: 32.8 g/dL (ref 30.0–36.0)
MCV: 89 fL (ref 80.0–100.0)
Platelets: 306 10*3/uL (ref 150–400)
RBC: 3.46 MIL/uL — ABNORMAL LOW (ref 3.87–5.11)
RDW: 12.2 % (ref 11.5–15.5)
WBC: 9.9 10*3/uL (ref 4.0–10.5)
nRBC: 0 % (ref 0.0–0.2)

## 2023-07-13 MED ORDER — POLYETHYLENE GLYCOL 3350 17 G PO PACK: 17.0000 g | PACK | Freq: Every day | ORAL | 0 refills | Status: DC

## 2023-07-13 MED ORDER — VANCOMYCIN HCL 750 MG/150ML IV SOLN: 750.0000 mg | Freq: Once | INTRAVENOUS | Status: DC

## 2023-07-13 MED ORDER — VANCOMYCIN IV (FOR PTA / DISCHARGE USE ONLY): 750.0000 mg | INTRAVENOUS | 0 refills | Status: AC

## 2023-07-13 MED ORDER — METOPROLOL TARTRATE 25 MG PO TABS: 25.0000 mg | ORAL_TABLET | Freq: Two times a day (BID) | ORAL | 0 refills | Status: DC

## 2023-07-13 MED ORDER — OXYCODONE-ACETAMINOPHEN 5-325 MG PO TABS: 1.0000 | ORAL_TABLET | Freq: Four times a day (QID) | ORAL | 0 refills | Status: DC | PRN

## 2023-07-13 MED ORDER — DIPHENHYDRAMINE HCL 50 MG/ML IJ SOLN
50.0000 mg | INTRAMUSCULAR | Status: AC
  Administered 2023-07-13: 50 mg via INTRAVENOUS
  Filled 2023-07-13: qty 1

## 2023-07-13 MED ORDER — METOPROLOL TARTRATE 25 MG PO TABS
25.0000 mg | ORAL_TABLET | Freq: Once | ORAL | Status: AC
  Administered 2023-07-13: 25 mg via ORAL
  Filled 2023-07-13: qty 1

## 2023-07-13 MED ORDER — METRONIDAZOLE 500 MG PO TABS: 500.0000 mg | ORAL_TABLET | Freq: Two times a day (BID) | ORAL | 0 refills | Status: AC

## 2023-07-13 MED ORDER — METHYLPREDNISOLONE SODIUM SUCC 125 MG IJ SOLR
125.0000 mg | INTRAMUSCULAR | Status: AC
  Administered 2023-07-13: 125 mg via INTRAVENOUS
  Filled 2023-07-13: qty 2

## 2023-07-13 MED ORDER — FAMOTIDINE IN NACL 20-0.9 MG/50ML-% IV SOLN
20.0000 mg | Freq: Once | INTRAVENOUS | Status: AC
  Administered 2023-07-13: 20 mg via INTRAVENOUS
  Filled 2023-07-13: qty 50

## 2023-07-13 NOTE — Treatment Plan (Addendum)
Diagnosis: Left foot infection and osteomyelitis Baseline Creatinine ESRD  Culture Result: strep mitis and finegoldia magna  No Known Allergies  OPAT Orders Vancomycin 750mg  IV during dialysis for 5 weeks  Aim for Vancomycin trough 15-20 (unless otherwise indicated) Duration: 4 weeks End Date: 08/05/23  :  Labs weekly while on IV antibiotics: _X_ CBC with differential  _X_ CMP _X_ CRP _X_ ESR _X_ Vancomycin trough    Fax weekly lab results  promptly to [phone]  Clinic Follow Up Appt:08/03/23 at 10.45AM With Dr.Nahlia Hellmann  Call [phone] with nay questions

## 2023-07-13 NOTE — ED Provider Notes (Signed)
[facility] Provider Note    Event Date/Time   First MD Initiated Contact with Patient 07/13/23 1804     (approximate)   History   Chief Complaint: Angioedema   HPI  Emma Thomas is a 27 y.o. female with a history of diabetes, hypertension, ESRD on hemodialysis who comes to the ED complaining of swelling of the tongue that started at about 5 PM today.  Denies any known allergies or new foods or medication exposures.  She is on losartan long-term.  Denies any insect stings or bites.  Never had anything like this happen before.     Physical Exam   Triage Vital Signs: ED Triage Vitals [07/13/23 1802]  Encounter Vitals Group     BP (!) 160/107     Systolic BP Percentile      Diastolic BP Percentile      Pulse Rate (!) 111     Resp 20     Temp 98.4 F (36.9 C)     Temp src      SpO2 100 %     Weight 167 lb 8.8 oz (76 kg)     Height 5\' 6"  (1.676 m)     Head Circumference      Peak Flow      Pain Score 0     Pain Loc      Pain Education      Exclude from Growth Chart     Most recent vital signs: Vitals:   07/13/23 1815 07/13/23 1830  BP: (!) 165/111 (!) 165/117  Pulse: (!) 110 (!) 120  Resp: (!) 22 16  Temp:    SpO2: 100% 99%    General: Awake, no distress.  CV:  Good peripheral perfusion.  Tachycardia heart rate 110 Resp:  Normal effort.  Clear to auscultation bilaterally Abd:  No distention.  Soft nontender Other:  Right upper extremity AV fistula with strong thrill, no rash.  No soft tissue inflammatory changes. Edematous tongue, not occluding her airway.  Oropharynx is normal with no signs of edema of the uvula or tonsils.  Tip of the epiglottis is visible and not edematous.  Floor of mouth is soft.  Neck is soft, nontender   ED Results / Procedures / Treatments   Labs (all labs ordered are listed, but only abnormal results are displayed) Labs Reviewed  CBC - Abnormal; Notable for the following components:      Result Value    RBC 3.46 (*)    Hemoglobin 10.1 (*)    HCT 30.8 (*)    All other components within normal limits  BASIC METABOLIC PANEL - Abnormal; Notable for the following components:   Sodium 133 (*)    Chloride 96 (*)    CO2 16 (*)    Glucose, Bld 403 (*)    BUN 26 (*)    Creatinine, Ser 5.97 (*)    Calcium 8.2 (*)    GFR, Estimated 9 (*)    Anion gap 21 (*)    All other components within normal limits     EKG Interpreted by me Sinus tachycardia rate 115.  Normal axis, normal intervals.  Poor R wave progression.  Normal ST segments and T waves   RADIOLOGY    PROCEDURES:  Procedures   MEDICATIONS ORDERED IN ED: Medications  metoprolol tartrate (LOPRESSOR) tablet 25 mg (has no administration in time range)  methylPREDNISolone sodium succinate (SOLU-MEDROL) 125 mg/2 mL injection 125 mg (125 mg Intravenous Given 07/13/23  1815)  diphenhydrAMINE (BENADRYL) injection 50 mg (50 mg Intravenous Given 07/13/23 1816)  famotidine (PEPCID) IVPB 20 mg premix (20 mg Intravenous New Bag/Given 07/13/23 1820)     IMPRESSION / MDM / ASSESSMENT AND PLAN / ED COURSE  I reviewed the triage vital signs and the nursing notes.  DDx: Angioedema, allergic reaction, uremia, electrolyte abnormality  Patient's presentation is most consistent with acute presentation with potential threat to life or bodily function.  Dialysis dependent patient presents with pronounced swelling of the tongue.  No pain.  Maintaining airway and managing secretions.  Not in distress.  Possibly related to losartan.  Will give Solu-Medrol, antihistamines and observe in ED for improvement.   ----------------------------------------- 7:50 PM on 07/13/2023 ----------------------------------------- Patient feels much better.  On repeat exam, tongue appears normal without edema.  Her mouth is still soft, oropharynx is still normal stable for discharge.  Counseled patient to discontinue losartan, start metoprolol for blood pressure  management in addition to her Norvasc, recommend close follow-up with PCP.      FINAL CLINICAL IMPRESSION(S) / ED DIAGNOSES   Final diagnoses:  Angioedema of tongue  ESRD on hemodialysis (HCC)     Rx / DC Orders   ED Discharge Orders          Ordered    metoprolol tartrate (LOPRESSOR) 25 MG tablet  2 times daily        07/13/23 1949             Note:  This document was prepared using Dragon voice recognition software and may include unintentional dictation errors.   Sharman Cheek, MD 07/13/23 1950

## 2023-07-13 NOTE — Evaluation (Addendum)
Physical Therapy Evaluation Patient Details Name: DENZEL BACON MRN: [id number] DOB: [birth year] Today's Date: 07/13/2023  History of Present Illness  TEAUNA PRIDGEON is a 27 y.o. female with medical history significant for ESRD on home hemodialysis X 4 days, type 1 diabetes mellitus, hypertension, dyslipidemia, HFpEF and anemia of chronic disease, who was just admitted here for left foot osteomyelitis and underwent debridement by Dr. Excell Seltzer.  She was being managed in stepdown for DKA and left AMA. S/p left foot incision and drainage with removal of a portion of the first metatarsal and medial cuneiform with excision of wound with closure on 8/23  Clinical Impression   Pt presents laying in bed with nursing in room, complaints of moderate pain. She currently lives with her partner in a 2nd story apartment with 14 steps to enter and a rail on the L. PTA she was independent with mobility and all ADLs.   Pt able to perform bed mobility modI, sit<>stand with supervision and 1 crutch on R, and ambulate ~171ft with CGA and 1 crutch on R. Pt required repeated cueing for maintaining WB through heel in post op shoe throughout session, but inconsistent with correction and unable to maintain after a few seconds. Pt would benefit from continued skilled therapy to maximize functional abilities.   BP monitored and pt asymptomatic throughout session (rest: 146/101, following activity: 159/104). RN aware and administered BP medication at beginning of session.       If plan is discharge home, recommend the following: A little help with walking and/or transfers;A little help with bathing/dressing/bathroom;Assistance with cooking/housework;Assist for transportation;Help with stairs or ramp for entrance;Direct supervision/assist for medications management   Can travel by private vehicle        Equipment Recommendations None recommended by PT  Recommendations for Other Services       Functional Status  Assessment Patient has had a recent decline in their functional status and demonstrates the ability to make significant improvements in function in a reasonable and predictable amount of time.     Precautions / Restrictions Restrictions Weight Bearing Restrictions: Yes LLE Weight Bearing: Partial weight bearing Other Position/Activity Restrictions: Heel contact in post op shoe (short distances)      Mobility  Bed Mobility Overal bed mobility: Modified Independent                  Transfers Overall transfer level: Needs assistance Equipment used: Crutches (crutch on R side) Transfers: Sit to/from Stand Sit to Stand: Supervision           General transfer comment: repeated cueing to maintain WB through heel, unable to correct    Ambulation/Gait Ambulation/Gait assistance: Contact guard assist Gait Distance (Feet): 150 Feet Assistive device: Crutches (Crutch on R side) Gait Pattern/deviations: Decreased step length - right, Decreased step length - left, Decreased stance time - left Gait velocity: decreased     General Gait Details: repeated cueing for pt to maintain WB through heel with ambulation and crutch sequencing. Pt inconsistent with correction  Stairs            Wheelchair Mobility     Tilt Bed    Modified Rankin (Stroke Patients Only)       Balance Overall balance assessment: Needs assistance Sitting-balance support: No upper extremity supported, Feet supported Sitting balance-Leahy Scale: Normal     Standing balance support: During functional activity, Single extremity supported Standing balance-Leahy Scale: Good  Pertinent Vitals/Pain Pain Assessment Pain Assessment: Faces Faces Pain Scale: Hurts even more Pain Location: L foot with ambulation Pain Descriptors / Indicators: Constant, Discomfort Pain Intervention(s): Limited activity within patient's tolerance, Monitored during session     Home Living Family/patient expects to be discharged to:: Private residence Living Arrangements: Spouse/significant other Available Help at Discharge: Family;Available PRN/intermittently Type of Home: Apartment Home Access: Stairs to enter Entrance Stairs-Rails: Left Entrance Stairs-Number of Steps: 14   Home Layout: One level        Prior Function Prior Level of Function : Independent/Modified Independent             Mobility Comments: IND, no AD ADLs Comments: IND     Extremity/Trunk Assessment   Upper Extremity Assessment Upper Extremity Assessment: Overall WFL for tasks assessed    Lower Extremity Assessment Lower Extremity Assessment: Overall WFL for tasks assessed    Cervical / Trunk Assessment Cervical / Trunk Assessment: Normal  Communication   Communication Communication: No apparent difficulties  Cognition Arousal: Alert Behavior During Therapy: WFL for tasks assessed/performed, Flat affect Overall Cognitive Status: Within Functional Limits for tasks assessed                                          General Comments General comments (skin integrity, edema, etc.): BP at rest 146/101 and asymptomatic, RN aware and administered BP medication at beginning of session. BP following activity 159/104.    Exercises     Assessment/Plan    PT Assessment Patient needs continued PT services  PT Problem List Decreased strength;Decreased mobility;Decreased knowledge of use of DME;Decreased knowledge of precautions;Pain;Decreased activity tolerance;Decreased balance;Decreased safety awareness       PT Treatment Interventions DME instruction;Gait training;Stair training;Functional mobility training;Therapeutic activities;Therapeutic exercise;Balance training;Patient/family education;Neuromuscular re-education    PT Goals (Current goals can be found in the Care Plan section)  Acute Rehab PT Goals Patient Stated Goal: return home PT Goal  Formulation: With patient Time For Goal Achievement: 07/27/23 Potential to Achieve Goals: Good    Frequency Min 1X/week     Co-evaluation               AM-PAC PT "6 Clicks" Mobility  Outcome Measure Help needed turning from your back to your side while in a flat bed without using bedrails?: None Help needed moving from lying on your back to sitting on the side of a flat bed without using bedrails?: None Help needed moving to and from a bed to a chair (including a wheelchair)?: A Little Help needed standing up from a chair using your arms (e.g., wheelchair or bedside chair)?: A Little Help needed to walk in hospital room?: A Little Help needed climbing 3-5 steps with a railing? : A Little 6 Click Score: 20    End of Session Equipment Utilized During Treatment: Gait belt Activity Tolerance: Patient tolerated treatment well Patient left: in bed;with call bell/phone within reach   PT Visit Diagnosis: Other abnormalities of gait and mobility (R26.89);Pain Pain - Right/Left: Left Pain - part of body: Ankle and joints of foot    Time: 1020-1039 PT Time Calculation (min) (ACUTE ONLY): 19 min   Charges:   PT Evaluation $PT Eval Low Complexity: 1 Low PT Treatments $Therapeutic Activity: 8-22 mins PT General Charges $$ ACUTE PT VISIT: 1 Visit        Tanis Burnley, PT, SPT 1:06 PM,07/13/23

## 2023-07-13 NOTE — Progress Notes (Signed)
[facility]  ROUNDING NOTE   Subjective:   Emma Thomas admitted to [facility] after leaving AMA last night so she could eat a burger.   Patient readmitted for DKA (diabetic ketoacidosis) (HCC) [E11.10] ESRD on hemodialysis (HCC) [N18.6, Z99.2] Diabetic ketoacidosis without coma associated with type 1 diabetes mellitus (HCC) [E10.10] Osteomyelitis of left foot, unspecified type (HCC) [M86.9] Pseudohyponatremia [R79.89]  Patient resting quietly.  Pain well managed Tolerating meals.   Objective:  Vital signs in last 24 hours:  Temp:  [97.7 F (36.5 C)-98.6 F (37 C)] 97.7 F (36.5 C) (08/27 1009) Pulse Rate:  [60-93] 93 (08/27 1210) Resp:  [14-18] 17 (08/27 1009) BP: (147-172)/(54-102) 156/99 (08/27 1210) SpO2:  [98 %-100 %] 98 % (08/27 1009)  Weight change:  Filed Weights   07/09/23 2145 07/10/23 0800 07/12/23 0923  Weight: 68 kg 78.1 kg 76.5 kg    Intake/Output: I/O last 3 completed shifts: In: 100 [IV Piggyback:100] Out: 1500 [Other:1500]   Intake/Output this shift:  No intake/output data recorded.  Physical Exam: General: NAD  Head: Normocephalic, atraumatic. Moist oral mucosal membranes  Eyes: Anicteric  Lungs:  Clear to auscultation  Heart: Regular rate and rhythm  Abdomen:  Soft, nontender  Extremities:  no peripheral edema. Left foot in dressings, clean and dry  Neurologic: Nonfocal, moving all four extremities  Skin: No lesions  Access: Rt AVF    Basic Metabolic Panel: Recent Labs  Lab 07/08/23 1632 07/08/23 2055 07/10/23 1756 07/10/23 2123 07/11/23 0441 07/11/23 0445 07/12/23 0443 07/13/23 0353  NA 132*   < > 132* 132*  133*  --  133* 136 136  K 4.3   < > 3.8 3.5  3.6  --  3.3* 4.1 3.4*  CL 92*   < > 97* 99  100  --  101 102 101  CO2 14*   < > 20* 19*  21*  --  19* 18* 26  GLUCOSE 344*   < > 147* 155*  158*  --  90 134* 84  BUN 37*   < > 56* 54*  54*  --  54* 51* 22*  CREATININE 6.00*   < > 7.95* 8.04*  7.90*  --   7.97* 8.44* 4.95*  CALCIUM 7.3*   < > 6.6* 6.5*  6.5*  --  6.7* 7.3* 7.5*  MG  --   --   --   --  2.2  --   --   --   PHOS 6.5*  --   --  5.6*  --   --   --   --    < > = values in this interval not displayed.    Liver Function Tests: Recent Labs  Lab 07/07/23 1348 07/08/23 1632 07/10/23 0123 07/10/23 2123  AST 22  --  18  --   ALT 14  --  16  --   ALKPHOS 65  --  70  --   BILITOT 1.5*  --  1.9*  --   PROT 7.1  --  7.6  --   ALBUMIN 2.7* 2.4* 2.9* 2.4*   No results for input(s): "LIPASE", "AMYLASE" in the last 168 hours. No results for input(s): "AMMONIA" in the last 168 hours.  CBC: Recent Labs  Lab 07/07/23 1210 07/08/23 0622 07/09/23 0431 07/10/23 0123 07/10/23 2123 07/11/23 0445 07/12/23 1011  WBC 3.7*   < > 14.3* 12.6* 12.1* 10.4 6.3  NEUTROABS 3.3  --  11.2* 12.1*  --   --   --  HGB 8.2*   < > 7.9* 9.3* 7.8* 8.3* 8.6*  HCT 25.2*   < > 23.2* 29.7* 23.3* 25.2* 26.1*  MCV 91.6   < > 86.9 92.8 88.6 88.7 87.9  PLT 304   < > 269 331 260 280 259   < > = values in this interval not displayed.    Cardiac Enzymes: No results for input(s): "CKTOTAL", "CKMB", "CKMBINDEX", "TROPONINI" in the last 168 hours.  BNP: Invalid input(s): "POCBNP"  CBG: Recent Labs  Lab 07/12/23 2032 07/13/23 0018 07/13/23 0337 07/13/23 0838 07/13/23 1205  GLUCAP 127* 72 79 121* 149*    Microbiology: Results for orders placed or performed during the hospital encounter of 07/07/23  Blood Culture (routine x 2)     Status: None   Collection Time: 07/07/23 12:10 PM   Specimen: BLOOD  Result Value Ref Range Status   Specimen Description BLOOD BLOOD LEFT ARM  Final   Special Requests   Final    BOTTLES DRAWN AEROBIC AND ANAEROBIC Blood Culture adequate volume   Culture   Final    NO GROWTH 5 DAYS Performed at [facility], [address] [place], [place] [address]    Report Status 07/12/2023 FINAL  Final  SARS Coronavirus 2 by RT PCR (hospital order, performed in [facility] hospital lab) *cepheid single result test* Anterior Nasal Swab     Status: None   Collection Time: 07/07/23 12:10 PM   Specimen: Anterior Nasal Swab  Result Value Ref Range Status   SARS Coronavirus 2 by RT PCR NEGATIVE NEGATIVE Final    Comment: (NOTE) SARS-CoV-2 target nucleic acids are NOT DETECTED.  The SARS-CoV-2 RNA is generally detectable in upper and lower respiratory specimens during the acute phase of infection. The lowest concentration of SARS-CoV-2 viral copies this assay can detect is 250 copies / mL. A negative result does not preclude SARS-CoV-2 infection and should not be used as the sole basis for treatment or other patient management decisions.  A negative result may occur with improper specimen collection / handling, submission of specimen other than nasopharyngeal swab, presence of viral mutation(s) within the areas targeted by this assay, and inadequate number of viral copies (<250 copies / mL). A negative result must be combined with clinical observations, patient history, and epidemiological information.  Fact Sheet for Patients:   RoadLapTop.co.za  Fact Sheet for Healthcare Providers: http://kim-miller.com/  This test is not yet approved or  cleared by the [place] FDA and has been authorized for detection and/or diagnosis of SARS-CoV-2 by FDA under an Emergency Use Authorization (EUA).  This EUA will remain in effect (meaning this test can be used) for the duration of the COVID-19 declaration under Section 564(b)(1) of the Act, 21 U.S.C. section 360bbb-3(b)(1), unless the authorization is terminated or revoked sooner.  Performed at [facility], [address] [place], [place] [address]   Culture, blood (Routine X 2) w Reflex to ID Panel     Status: None   Collection Time: 07/07/23 10:46 PM   Specimen: BLOOD  Result Value Ref Range Status   Specimen Description BLOOD  LAC  Final    Special Requests   Final    BOTTLES DRAWN AEROBIC AND ANAEROBIC Blood Culture adequate volume   Culture   Final    NO GROWTH 5 DAYS Performed at [facility], [address], [place], [place] [address]    Report Status 07/12/2023 FINAL  Final  MRSA Next Gen by PCR, Nasal  Status: None   Collection Time: 07/07/23 11:50 PM   Specimen: Nasal Mucosa; Nasal Swab  Result Value Ref Range Status   MRSA by PCR Next Gen NOT DETECTED NOT DETECTED Final    Comment: (NOTE) The GeneXpert MRSA Assay (FDA approved for NASAL specimens only), is one component of a comprehensive MRSA colonization surveillance program. It is not intended to diagnose MRSA infection nor to guide or monitor treatment for MRSA infections. Test performance is not FDA approved in patients less than 39 years old. Performed at [facility], [address] [place], [place] [address]   Aerobic/Anaerobic Culture w Gram Stain (surgical/deep wound)     Status: None (Preliminary result)   Collection Time: 07/09/23  5:01 PM   Specimen: Foot, Left; Wound  Result Value Ref Range Status   Specimen Description WOUND FOOT  Final   Special Requests SWAB SAMPLE A  Final   Gram Stain   Final    FEW WBC PRESENT, PREDOMINANTLY PMN NO ORGANISMS SEEN Performed at [facility], [address] [place], [place] [address]    Culture   Final    RARE DIPHTHEROIDS(CORYNEBACTERIUM SPECIES) Standardized susceptibility testing for this organism is not available. RARE STREPTOCOCCUS MITIS/ORALIS NO ANAEROBES ISOLATED; CULTURE IN PROGRESS FOR 5 DAYS    Report Status PENDING  Incomplete   Organism ID, Bacteria STREPTOCOCCUS MITIS/ORALIS  Final      Susceptibility   Streptococcus mitis/oralis - MIC*    TETRACYCLINE >=16 RESISTANT Resistant     VANCOMYCIN 0.25 SENSITIVE Sensitive     CLINDAMYCIN >=1 RESISTANT Resistant     * RARE STREPTOCOCCUS MITIS/ORALIS  Aerobic/Anaerobic Culture w Gram Stain (surgical/deep wound)      Status: None   Collection Time: 07/09/23  5:17 PM   Specimen: Foot, Left; Wound  Result Value Ref Range Status   Specimen Description BONE LEFT FOOT  Final   Special Requests 1ST METATARSAL SAMPLE B  Final   Gram Stain NO WBC SEEN NO ORGANISMS SEEN   Final   Culture   Final    RARE FINEGOLDIA MAGNA Standardized susceptibility testing for this organism is not available. Performed at [facility], [address], [place], [place] [address]    Report Status 07/12/2023 FINAL  Final  Aerobic/Anaerobic Culture w Gram Stain (surgical/deep wound)     Status: None (Preliminary result)   Collection Time: 07/09/23  5:22 PM   Specimen: Foot, Left; Wound  Result Value Ref Range Status   Specimen Description BONE LEFT FOOT 1ST CUNEIFORM  Final   Special Requests NONE  Final   Gram Stain NO WBC SEEN NO ORGANISMS SEEN   Final   Culture   Final    NO GROWTH 4 DAYS NO ANAEROBES ISOLATED; CULTURE IN PROGRESS FOR 5 DAYS Performed at [facility], [address], [place], [place] [address]    Report Status PENDING  Incomplete    Coagulation Studies: No results for input(s): "LABPROT", "INR" in the last 72 hours.   Urinalysis: No results for input(s): "COLORURINE", "LABSPEC", "PHURINE", "GLUCOSEU", "HGBUR", "BILIRUBINUR", "KETONESUR", "PROTEINUR", "UROBILINOGEN", "NITRITE", "LEUKOCYTESUR" in the last 72 hours.  Invalid input(s): "APPERANCEUR"     Imaging: No results found.   Medications:    sodium chloride Stopped (07/11/23 0017)   metronidazole 500 mg (07/13/23 0542)   promethazine (PHENERGAN) injection (IM or IVPB)     [START ON 07/14/2023] vancomycin      amLODipine  10 mg Oral Daily   atorvastatin  80 mg Oral Daily   Chlorhexidine Gluconate Cloth  6 each Topical Daily   epoetin (EPOGEN/PROCRIT) injection  10,000 Units Intravenous Q M,W,F-HD   heparin injection (subcutaneous)  5,000 Units Subcutaneous Q8H   insulin aspart  0-9 Units Subcutaneous Q4H   insulin  glargine-yfgn  14 Units Subcutaneous Q24H   losartan  100 mg Oral Daily   metoCLOPramide (REGLAN) injection  10 mg Intravenous Q6H   montelukast  10 mg Oral QHS   multivitamin  1 tablet Oral QHS   norethindrone-ethinyl estradiol  1 tablet Oral Daily   polyethylene glycol  17 g Oral Daily   sevelamer carbonate  1,600 mg Oral TID with meals   sodium bicarbonate  650 mg Oral BID   Vitamin D (Ergocalciferol)  50,000 Units Oral Q7 days   sodium chloride, acetaminophen **OR** acetaminophen, dextrose, dextrose, HYDROmorphone (DILAUDID) injection, magnesium hydroxide, ondansetron **OR** ondansetron (ZOFRAN) IV, oxyCODONE-acetaminophen, promethazine (PHENERGAN) injection (IM or IVPB), sevelamer carbonate, SUMAtriptan, traZODone  Assessment/ Plan:  [name] is a 27 y.o.  female with past medical history of diabetes, anemia, and diabetes type 1, and end stage renal disease on peritoneal dialysis. Patient presents to the ED with fever and body aches. She has been admitted for DKA (diabetic ketoacidosis) (HCC) [E11.10] ESRD on hemodialysis (HCC) [N18.6, Z99.2] Diabetic ketoacidosis without coma associated with type 1 diabetes mellitus (HCC) [E10.10] Osteomyelitis of left foot, unspecified type (HCC) [M86.9] Pseudohyponatremia [R79.89]  End stage renal disease: on home hemodialysis  - Will maintain inpatient schedule of MWF.  - Current antibiotic regimen at discharge: Vancomycin 750mg  with each dialysis until 08/05/23.  - Outpatient clinic aware of antibiotics and will train patient accordingly. Labs requested on Mondays   2. Anemia of chronic kidney disease Lab Results  Component Value Date   HGB 8.6 (L) 07/12/2023    - Hgb below optimal range - Continue EPO with dialysis treatments  3. Secondary Hyperparathyroidism: with hypocalcemia and hyperphosphatemia.    Lab Results  Component Value Date   CALCIUM 7.5 (L) 07/13/2023   CAION 0.97 (L) 01/08/2023   PHOS 5.6 (H) 07/10/2023     - Prescribed ergocalciferol outpatient.  - Continue Sevelamer with meals.  - Corrected calcium 8.5  4. Diabetes mellitus type II with chronic kidney disease: insulin dependent. Most recent hemoglobin A1c is 8.5 on 06/18/23.   - with DKA on admission  - Insulin gtt stopped on 07/10/23   - Primary team to manage SSI  5. Sepsis secondary to left foot abscess. 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 I&D on 8/23 for abscess.   - Continue empiric cefepime and vancomycin  6. Hypertension: urgency on admission.   - Receiving losartan and amlodipine  - hold bumetanide.   - Blood pressure 156/99   LOS: 3   8/27/202412:20 PM

## 2023-07-13 NOTE — Progress Notes (Addendum)
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 Fax weekly lab results  promptly to [phone]  IV antibiotic discharge orders are pended. To discharging provider:  please sign these orders via discharge navigator,  Select New Orders & click on the button choice - Manage This Unsigned Work.     Thank you for allowing pharmacy to be a part of this patient's care.  Juliette Alcide, PharmD, BCPS, BCIDP Work Cell: [phone] 07/13/2023 11:35 AM

## 2023-07-13 NOTE — Discharge Instructions (Addendum)
Discontinue Losartan, which may be the cause of your tongue swelling.  Start metoprolol two times a day to help control your blood pressure.

## 2023-07-13 NOTE — ED Triage Notes (Signed)
Pt to ED for angioedema to tongue and lips started an hour ago. Denies new foods. Reports takes bp meds. Reports d/c from hospital a couple hours ago and was not having this problem.  Swelling noted to tongue, pt reports has a hard time closing mouth

## 2023-07-13 NOTE — Inpatient Diabetes Management (Signed)
Inpatient Diabetes Program Recommendations  AACE/ADA: New Consensus Statement on Inpatient Glycemic Control (2015)  Target Ranges:  Prepandial:   less than 140 mg/dL      Peak postprandial:   less than 180 mg/dL (1-2 hours)      Critically ill patients:  140 - 180 mg/dL   Lab Results  Component Value Date   GLUCAP 121 (H) 07/13/2023   HGBA1C 8.5 (H) 06/18/2023    Review of Glycemic Control  Latest Reference Range & Units 07/12/23 20:32 07/13/23 00:18 07/13/23 03:37 07/13/23 08:38  Glucose-Capillary 70 - 99 mg/dL 161 (H) 72 79 096 (H)  Admit with: DKA/ Osteomyelitis of left foot    History: Type 1 Diabetes, ESRD   Home DM Meds: Tandem Insulin Pump (see below for pump settings)   Current Orders: Semglee 14 units Q24H                            Novolog Sensitive Correction Scale/ SSI (0-9 units) Q4 hours     Please consider:   1. Reduce Semglee slightly to 12 units Q24H   2. Adjust the Novolog SSI to TID AC + HS (currently Q4 hours)    Thanks,  Beryl Meager, RN, BC-ADM Inpatient Diabetes Coordinator Pager [phone]  (8a-5p)

## 2023-07-13 NOTE — Progress Notes (Signed)
Pharmacy Antibiotic Note  Emma Thomas is a 27 y.o. female admitted on 07/10/2023 with  osteomyelitis  Patient left AMA on 8/23 because she could not get fast food here but returned for continued treatment because husband was urging her to return to [facility]. Pharmacy has been consulted for Vancomycin dosing. 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 Bone Culture from 8/3 surgery grew finegoldia and tissue culture grew finegoldia and S miti/oralis. 8/21 MRI with possible osteomyelitis of residual 1st ray.  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 Patient has been on a Tuesday/Thursday/Saturday dialysis schedule while admitted but her 8/24 dialysis dession was deferred to 8/26.  PLAN: Continue vancomycin 750 mg after every dialysis session Vancomycin last administered 8/26 Consider checking vancomycin level prior to next HD session Monitor culture results and optimize antibiotic therapy in coordination and collaboration with ID  Height: 5\' 6"  (167.6 cm) Weight: 76.5 kg (168 lb 10.4 oz) IBW/kg (Calculated) : 59.3  Temp (24hrs), Avg:98.4 F (36.9 C), Min:98.1 F (36.7 C), Max:98.6 F (37 C)  Recent Labs  Lab 07/07/23 1210 07/07/23 1348 07/07/23 1547 07/08/23 0622 07/09/23 0431 07/09/23 1043 07/10/23 0123 07/10/23 0456 07/10/23 1756 07/10/23 2123 07/11/23 0445 07/12/23 0443 07/12/23 1011 07/13/23 0353  WBC 3.7*  --   --    < > 14.3*  --  12.6*  --   --  12.1* 10.4  --  6.3  --   CREATININE  --    < >  --    < > 6.57*   < > 7.70*   < > 7.95* 8.04*  7.90* 7.97* 8.44*  --  4.95*  LATICACIDVEN 4.5*  --  1.3  --   --   --   --   --   --   --   --   --   --   --    < > = values in this interval not displayed.    Estimated Creatinine Clearance: 17.7 mL/min (A) (by C-G formula based on SCr of 4.95 mg/dL (H)).    No Known  Allergies  Antimicrobials: Cefepime 8/21 >> 8/25 Vancomycin 8/21 >> Metronidazole 8/26 >>   Dose adjustments this admission: N/A  Microbiology results: 8/23 Bone cultures x 2 samples: Rare Finegoldia magna 8/23 Wound: Rare Corynebacterium spp 8/21 BCx: NGF 8/21 MRSA PCR: negative 8/03 Wound: Rare Streptococcus mitis/oralis, Moderate Finegoldia magna  Thank you for allowing pharmacy to be a part of this patient's care.  Will M. Dareen Piano, PharmD Clinical Pharmacist 07/13/2023 9:16 AM

## 2023-07-13 NOTE — Evaluation (Signed)
Occupational Therapy Evaluation Patient Details Name: Emma Thomas MRN: [id number] DOB: 12/06/1995 Today's Date: 07/13/2023   History of Present Illness ILARIA LESIEUR is a 27 y.o. female with medical history significant for ESRD on home hemodialysis X 4 days, type 1 diabetes mellitus, hypertension, dyslipidemia, HFpEF and anemia of chronic disease, who was just admitted here for left foot osteomyelitis and underwent debridement by Dr. Excell Seltzer.  She was being managed in stepdown for DKA and left AMA. S/p left foot incision and drainage with removal of a portion of the first metatarsal and medial cuneiform with excision of wound with closure on 8/23   Clinical Impression   Pt was seen for OT evaluation this date. Prior to hospital admission, pt was IND with ADL/IADL performance, no AD. Currently has spouse available to assist (he works 3rd shift), and children will be at daycare. Pt verbalizes precautions of PWB on LLE, dons surgical shoe with supervision. Education provided to pt regarding role and purpose of OT in acute setting, precautions for LLE, and performance of ADL/IADLs with post-op shoe. Pt politely declines further OT services while in the hospital, feels like she is at her baseline. MMT BUE 5/5, no LOB while performing functional mobility, cues needed for precaution adherence while walking around room. Do not anticipate the need for follow up OT services upon acute hospital DC. Updated RN on pt's request for pain meds and decline of bed alarm. OT will complete orders, and sign off.       If plan is discharge home, recommend the following: Help with stairs or ramp for entrance;A little help with walking and/or transfers;A little help with bathing/dressing/bathroom;Assistance with cooking/housework    Functional Status Assessment  Patient has not had a recent decline in their functional status  Equipment Recommendations  None recommended by OT    Recommendations for Other Services        Precautions / Restrictions Restrictions Weight Bearing Restrictions: Yes LLE Weight Bearing: Partial weight bearing Other Position/Activity Restrictions: Heel contact in post op shoe (short distances)      Mobility Bed Mobility Overal bed mobility: Modified Independent             General bed mobility comments: Easily completes bed mobility    Transfers Overall transfer level: Needs assistance Equipment used:  (surgical shoe) Transfers: Sit to/from Stand Sit to Stand: Supervision           General transfer comment: cues for using heel to WB      Balance Overall balance assessment: Needs assistance Sitting-balance support: No upper extremity supported, Feet supported Sitting balance-Leahy Scale: Normal     Standing balance support: No upper extremity supported, During functional activity (no LOB, walks around room with OT providing education/cuing for PWB status on LLE)                               ADL either performed or assessed with clinical judgement   ADL Overall ADL's : Needs assistance/impaired                                     Functional mobility during ADLs: Supervision/safety;Cueing for safety (Cuing to adhere to [facility] precautions LLE) General ADL Comments: Pt states she is at baseline for ADL IND and has been taking herself to bathroom. Completes bout of mobility with surgical shoe donned around  room with no AD - no LOB noted, however, pt does require cuing for [facility] precautions and education.     Vision Baseline Vision/History: 0 No visual deficits              Pertinent Vitals/Pain Pain Assessment Pain Assessment: 0-10 Pain Score: 7  Pain Location: L foot Pain Descriptors / Indicators: Aching, Sore Pain Intervention(s): Limited activity within patient's tolerance, Patient requesting pain meds-RN notified     Extremity/Trunk Assessment Upper Extremity Assessment Upper Extremity Assessment: Overall WFL  for tasks assessed (5/5 strength bilaterally)   Lower Extremity Assessment Lower Extremity Assessment: Overall WFL for tasks assessed   Cervical / Trunk Assessment Cervical / Trunk Assessment: Normal   Communication Communication Communication: No apparent difficulties   Cognition Arousal: Alert Behavior During Therapy: WFL for tasks assessed/performed Overall Cognitive Status: Within Functional Limits for tasks assessed                                          Exercises Exercises: Other exercises Other Exercises Other Exercises: Education on weightbearing status, role of OT and purpose of eval. Strategies for ADL performance with surgical shoe.        Home Living Family/patient expects to be discharged to:: Private residence Living Arrangements: Spouse/significant other Available Help at Discharge: Family;Available PRN/intermittently Type of Home: Apartment Home Access: Stairs to enter Entrance Stairs-Number of Steps: 14 Entrance Stairs-Rails: Left Home Layout: One level     Bathroom Shower/Tub: Chief Strategy Officer: Standard                Prior Functioning/Environment Prior Level of Function : Independent/Modified Independent             Mobility Comments: IND, no AD ADLs Comments: IND         AM-PAC OT "6 Clicks" Daily Activity     Outcome Measure Help from another person eating meals?: None Help from another person taking care of personal grooming?: None Help from another person toileting, which includes using toliet, bedpan, or urinal?: None Help from another person bathing (including washing, rinsing, drying)?: A Little   Help from another person to put on and taking off regular lower body clothing?: None 6 Click Score: 19   End of Session Nurse Communication: Mobility status;Patient requests pain meds;Other (comment) (pt refusing bed alarm)  Activity Tolerance: Patient tolerated treatment well Patient left:  in bed;with call bell/phone within reach  OT Visit Diagnosis: Other abnormalities of gait and mobility (R26.89)                Time: 4098-1191 OT Time Calculation (min): 17 min Charges:  OT General Charges $OT Visit: 1 Visit OT Evaluation $OT Eval Low Complexity: 1 Low OT Treatments $Self Care/Home Management : 8-22 mins  Wisam Siefring L. Katelynne Revak, OTR/L  07/13/23, 10:09 AM

## 2023-07-13 NOTE — Discharge Summary (Addendum)
Physician Discharge Summary   Patient: Emma Thomas MRN: [id number] DOB: 20-Dec-1995  Admit date:     07/10/2023  Discharge date: 07/13/23  Discharge Physician: Arnetha Courser   PCP: [name]   Recommendations at discharge:  Please obtain CBC and BMP on follow-up Continue with IV vancomycin during dialysis Please ensure that she continue taking Flagyl until 08/05/2023 Follow-up with podiatry for further recommendations regarding recent amputation and wound care Follow-up with infectious disease Follow-up with primary care provider  Discharge Diagnoses: Principal Problem:   DKA (diabetic ketoacidosis) (HCC) Active Problems:   Osteomyelitis of left foot (HCC)   End-stage renal disease on hemodialysis (HCC)   Hypertensive urgency   Dyslipidemia   Migraine   ESRD on hemodialysis [facility])   Pseudohyponatremia   Hospital Course: Taken from H&P.  Emma Thomas is a 27 y.o. female with medical history significant for ESRD on home hemodialysis X 4 days, type 1 diabetes mellitus, hypertension, dyslipidemia, HFpEF and anemia of chronic disease, who was just admitted here for left foot osteomyelitis and underwent debridement by Dr. Excell Seltzer.  She was being managed in stepdown for DKA and left AMA yesterday.  Came back overnight with mild nausea and left foot pain.  On representation to ER, blood pressure was 181/109, pseudohyponatremia secondary to hyperglycemia, CO2 was 12 and anion gap of 22, BUN 49, creatinine 7.7.VBG showed pH 7.16 and bicarbonate of 8.6. Total bili was 1.9 with albumin 2.9 otherwise unremarkable LFTs. CBC showed leukocytosis 12.6 with neutrophilia and anemia better than her previous levels.   Patient was again started on DKA protocol with Endo tool along with IV antibiotics with cefepime and vancomycin.  8/24: CBG improving.  Recent wound cultures from debridement remain negative, prior wound cultures on 8/3 with Streptococcus mitis/oralis which were  only sensitive to vancomycin.  Starting on diet at her request to prevent another AMA, gap still open.  8/25: DKA resolved and patient is now on basal and short-acting insulin.  Packing was removed by podiatry yesterday.  Cultures remain negative.  Podiatry wants to continue with IV antibiotics and involve ID based on her prior cultures.  They will change another dressing tomorrow before discharge.  8/26: Cultures started growing rare similar bacteria as prior culture.  Patient will need IV vancomycin due to the resistance and Flagyl per ID.  Nephrology is trying to arrange outpatient dialysis so IV vancomycin can be given during that time.  Currently doing home hemodialysis.  8/27: Medically stable.  Patient will continue with vancomycin for at least for 4 more weeks during dialysis, she will take Flagyl until 08/05/2023 as recommended by ID.  ID will contact the dialysis if need to make changes to the duration of antibiotics based on bone culture results which are still pending.  Patient will continue on current medications need to have close follow-up with her providers for further recommendations.  Assessment and Plan: * DKA (diabetic ketoacidosis) (HCC) DKA resolved, gap closed and patient is now on basal and short-acting. -Continue with current management   Osteomyelitis of left foot (HCC) - Status post left foot debridement.  Patient did not met sepsis criteria-sepsis ruled out. Intraoperative cultures eventually started growing rare bacteria, similar to the prior cultures.  Discussed with ID and patient will need IV vancomycin based on prior resistance along with p.o. Flagyl.   IV vancomycin will be given during dialysis by her dialysis nurse.  Currently suggesting 4 weeks which can be changed once bone biopsy results are  available -P.o. Flagyl until 08/05/2023 -Follow-up podiatry recommendations. - Continue with pain management  End-stage renal disease on hemodialysis [facility] Patient  was doing hemodialysis at home, nephrology was consulted and patient will received dialysis while in hospital. - Continue Renvela. -Nephrology holding Bumex  Hypertensive urgency Blood pressure improving - Continue with home antihypertensives - We will place on as needed IV labetalol.  Dyslipidemia - We will continue statin therapy.  Migraine - We will continue her Imitrex.   Pain control - [employer] Controlled Substance Reporting System database was reviewed. and patient was instructed, not to drive, operate heavy machinery, perform activities at heights, swimming or participation in water activities or provide baby-sitting services while on Pain, Sleep and Anxiety Medications; until their outpatient Physician has advised to do so again. Also recommended to not to take more than prescribed Pain, Sleep and Anxiety Medications.  Consultants: Podiatry.  Infectious disease.  Nephrology Procedures performed: Hemodialysis Disposition: Home Diet recommendation:  Discharge Diet Orders (From admission, onward)     Start     Ordered   07/13/23 0000  Diet - low sodium heart healthy        07/13/23 1225           Cardiac and Carb modified diet DISCHARGE MEDICATION: Allergies as of 07/13/2023   No Known Allergies      Medication List     STOP taking these medications    furosemide 80 MG tablet Commonly known as: LASIX       TAKE these medications    acetaminophen 500 MG tablet Commonly known as: TYLENOL Take 2 tablets (1,000 mg total) by mouth every 6 (six) hours as needed for mild pain.   amLODipine 10 MG tablet Commonly known as: NORVASC Take 1 tablet (10 mg total) by mouth daily.   atorvastatin 80 MG tablet Commonly known as: LIPITOR Take 80 mg by mouth daily.   bumetanide 1 MG tablet Commonly known as: BUMEX Take 1 mg by mouth 2 (two) times daily.   HumaLOG 100 UNIT/ML injection Generic drug: insulin lispro Inject 0-1 mLs (0-100 Units total) into the  skin daily. Uses with Insulin Pump   losartan 100 MG tablet Commonly known as: COZAAR Take 100 mg by mouth daily.   metroNIDAZOLE 500 MG tablet Commonly known as: Flagyl Take 1 tablet (500 mg total) by mouth 2 (two) times daily for 23 days.   montelukast 10 MG tablet Commonly known as: SINGULAIR Take 10 mg by mouth at bedtime.   multivitamin Tabs tablet Take 1 tablet by mouth at bedtime.   norethindrone-ethinyl estradiol 1-20 MG-MCG tablet Commonly known as: LOESTRIN Take 1 tablet by mouth daily.   ondansetron 8 MG tablet Commonly known as: ZOFRAN Take 8 mg by mouth daily.   oxyCODONE-acetaminophen 5-325 MG tablet Commonly known as: PERCOCET/ROXICET Take 1 tablet by mouth every 6 (six) hours as needed.   polyethylene glycol 17 g packet Commonly known as: MIRALAX / GLYCOLAX Take 17 g by mouth daily. Start taking on: July 14, 2023   sevelamer carbonate 800 MG tablet Commonly known as: RENVELA Take 800-1,600 mg by mouth See admin instructions. Take 2 tablets (1600mg ) by mouth three times daily with meals and take 1 tablet (800mg ) by mouth daily with snacks   SUMAtriptan 50 MG tablet Commonly known as: IMITREX Take 50 mg by mouth daily as needed for migraine.   vancomycin IVPB Inject 750 mg into the vein every Monday, Wednesday, and Friday with hemodialysis for 21 days. To be  administered during last hour of hemodialysis (home dialysis) Indication:  Finegoldia and S. Mitis/oralis osteomyelitis of toe Last Day of Therapy:  08/05/2023 (last dose to be given 08/04/2023) Labs - Mondays:  CBC/D, CMP, ESR, CRP and pre-hemodialysis vancomycin level . Fax weekly lab results  promptly to [phone] Start taking on: July 14, 2023   Vitamin D (Ergocalciferol) 1.25 MG (50000 UNIT) Caps capsule Commonly known as: DRISDOL Take 1 capsule (50,000 Units total) by mouth every 7 (seven) days.               Home Infusion Instuctions  (From admission, onward)            Start     Ordered   07/13/23 0000  Home infusion instructions       Comments: To be given with home dialysis  Question:  Instructions  Answer:  Flushing of vascular access device: 0.9% NaCl pre/post medication administration and prn patency; Heparin 100 u/ml, 5ml for implanted ports and Heparin 10u/ml, 5ml for all other central venous catheters.   07/13/23 1225              Discharge Care Instructions  (From admission, onward)           Start     Ordered   07/13/23 0000  Leave dressing on - Keep it clean, dry, and intact until clinic visit        07/13/23 1225            Follow-up Information     Rosetta Posner, DPM. Go on 07/23/2023.   Specialty: Podiatry Why: 2:45pm  For wound re-check Contact information: [address] [place] [address] [phone]         [name]. Go on 07/20/2023.   Why: @3pm  Contact information: [address] [place] [address] [phone]                Discharge Exam: Filed Weights   07/09/23 2145 07/10/23 0800 07/12/23 0923  Weight: 68 kg 78.1 kg 76.5 kg   General.  Well-developed lady, in no acute distress. Pulmonary.  Lungs clear bilaterally, normal respiratory effort. CV.  Regular rate and rhythm, no JVD, rub or murmur. Abdomen.  Soft, nontender, nondistended, BS positive. CNS.  Alert and oriented .  No focal neurologic deficit. Extremities.  No edema, no cyanosis, pulses intact and symmetrical. Psychiatry.  Judgment and insight appears normal.   Condition at discharge: stable  The results of significant diagnostics from this hospitalization (including imaging, microbiology, ancillary and laboratory) are listed below for reference.   Imaging Studies: DG Foot Complete Left  Result Date: 07/09/2023 CLINICAL DATA:  Postop amputation EXAM: LEFT FOOT - COMPLETE 3+ VIEW COMPARISON:  07/07/2023 FINDINGS: Interval amputation of the remnant base of first metatarsal as well as the  distal medial cuneiform. Air in the soft tissues consistent with interval surgery. Chronic AVN of the second metatarsal. IMPRESSION: Interval amputation of the remnant base of first metatarsal and distal medial cuneiform. Electronically Signed   By: Jasmine Pang M.D.   On: 07/09/2023 20:45   DG MINI C-ARM IMAGE ONLY  Result Date: 07/09/2023 There is no interpretation for this exam.  This order is for images obtained during a surgical procedure.  Please See "Surgeries" Tab for more information regarding the procedure.   MR FOOT LEFT WO CONTRAST  Result Date: 07/08/2023 CLINICAL DATA:  Fever and left foot pain. Recent first ray amputation. EXAM: MRI OF THE LEFT  FOOT WITHOUT CONTRAST TECHNIQUE: Multiplanar, multisequence MR imaging of the left forefoot was performed. No intravenous contrast was administered. COMPARISON:  Left foot x-rays from same day. MRI left foot dated June 18, 2023. FINDINGS: Bones/Joint/Cartilage Prior first ray amputation with small residual first metatarsal base. New marrow edema within the residual base, as well as the distal medial cuneiform. 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 No fracture or dislocation. No joint effusion. Ligaments Second through fifth toe collateral ligaments are intact. Lisfranc ligament is intact. Muscles and Tendons Second through fifth flexor and extensor tendons are intact. No tenosynovitis. Increased T2 signal within the intrinsic muscles of the forefoot, nonspecific, but likely related to diabetic muscle changes. Soft tissue Recurrent 4.5 x 2.9 x 2.5 cm complex fluid collection in the recent first metatarsal amputation surgical bed (series 8, image 10; series 9, images 9-10). Unchanged sinus tract extending to the plantar surface. No soft tissue mass. IMPRESSION: 1. Prior first ray amputation with small residual first metatarsal base. New marrow edema within the residual base, as well as the distal medial cuneiform, concerning for  osteomyelitis. 2. Recurrent 4.5 x 2.9 x 2.5 cm abscess in the recent first metatarsal amputation surgical bed. Unchanged sinus tract extending to the plantar surface. 3. 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 Electronically Signed   By: Obie Dredge M.D.   On: 07/08/2023 08:25   DG Chest Port 1 View  Result Date: 07/07/2023 CLINICAL DATA:  Questionable sepsis - evaluate for abnormality fever and chills. EXAM: PORTABLE CHEST 1 VIEW COMPARISON:  08/10/2022. FINDINGS: Bilateral lung fields are clear. Bilateral costophrenic angles are clear. Normal cardio-mediastinal silhouette. No acute osseous abnormalities. The soft tissues are within normal limits. IMPRESSION: No active disease. Electronically Signed   By: Jules Schick M.D.   On: 07/07/2023 13:39   DG Foot 2 Views Left  Result Date: 07/07/2023 CLINICAL DATA:  L foot pain, fever.  Recent surgery-2 weeks back. EXAM: LEFT FOOT - 2 VIEW COMPARISON:  06/19/2023. FINDINGS: Redemonstration of amputation of great toe, near the proximal base of the first metatarsal. The resection margin is sharp. No focal bone erosions noted. There is interval disuse osteopenia. No evidence of focal soft tissue defect or air within the soft tissue at the surgical resection site/stump. 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 Otherwise, no acute fracture or dislocation. No aggressive osseous lesion. No significant degenerative changes of imaged joints. No focal soft tissue swelling. No radiopaque foreign bodies. IMPRESSION: 1. Redemonstration of great toe amputation. No radiographic evidence of osteomyelitis. Correlate clinically to determine the need for additional imaging with more sensitive modality such as MRI. 2. 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 Electronically Signed   By: Jules Schick M.D.   On: 07/07/2023 13:31   DG Foot Complete Left  Result Date:  06/19/2023 CLINICAL DATA:  Left foot infection. Status post left foot first metatarsal amputation. EXAM: LEFT FOOT - COMPLETE 3+ VIEW COMPARISON:  Left foot radiographs 11/20/2022, MRI left forefoot 11/20/2022, MRI left forefoot 06/18/2023 FINDINGS: Interval repeat amputation of the first ray, now to the far proximal aspect of the first metatarsal. There are antibiotic beads seen within the amputation space. Expected postoperative air within the amputation space. 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 Mild to moderate navicular-medial cuneiform joint space narrowing, subchondral sclerosis, peripheral osteophytosis. IMPRESSION: Interval repeat amputation of  the first ray, now to the far proximal aspect of the first metatarsal. Expected postoperative changes. Electronically Signed   By: Neita Garnet M.D.   On: 06/19/2023 17:20   MR FOOT LEFT WO CONTRAST  Result Date: 06/18/2023 CLINICAL DATA:  Left foot infection.  History of prior amputation. EXAM: MRI OF THE LEFT FOOT WITHOUT CONTRAST TECHNIQUE: Multiplanar, multisequence MR imaging of the left forefoot was performed. No intravenous contrast was administered. COMPARISON:  MRI left foot and left foot x-rays dated November 20, 2022. FINDINGS: Bones/Joint/Cartilage Prior first ray amputation. Abnormal marrow edema with corresponding decreased T1 marrow signal involving the distal residual first metatarsal. 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 No fracture or dislocation. No joint effusion. Ligaments Second through fifth toe collateral ligaments are intact. Lisfranc ligament is intact. Muscles and Tendons Second through fifth flexor and extensor tendons are intact. No tenosynovitis. Increased T2 signal within the intrinsic muscles of the forefoot, nonspecific, but likely related to diabetic muscle changes.  Soft tissue 4.8 x 2.1 x 2.9 cm complex fluid collection at the first ray amputation site (series 10, image 14), with two sinus tracts draining to the skin surface (series 7, images 16 and 29). No soft tissue mass. IMPRESSION: 1. 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 2. 4.8 x 2.1 x 2.9 cm abscess at the first ray amputation site. 3. 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 Electronically Signed   By: Obie Dredge M.D.   On: 06/18/2023 15:08    Microbiology: Results for orders placed or performed during the hospital encounter of 07/07/23  Blood Culture (routine x 2)     Status: None   Collection Time: 07/07/23 12:10 PM   Specimen: BLOOD  Result Value Ref Range Status   Specimen Description BLOOD BLOOD LEFT ARM  Final   Special Requests   Final    BOTTLES DRAWN AEROBIC AND ANAEROBIC Blood Culture adequate volume   Culture   Final    NO GROWTH 5 DAYS Performed at [facility], [address] [place], [place] [address]    Report Status 07/12/2023 FINAL  Final  SARS Coronavirus 2 by RT PCR (hospital order, performed in [facility] hospital lab) *cepheid single result test* Anterior Nasal Swab     Status: None   Collection Time: 07/07/23 12:10 PM   Specimen: Anterior Nasal Swab  Result Value Ref Range Status   SARS Coronavirus 2 by RT PCR NEGATIVE NEGATIVE Final    Comment: (NOTE) SARS-CoV-2 target nucleic acids are NOT DETECTED.  The SARS-CoV-2 RNA is generally detectable in upper and lower respiratory specimens during the acute phase of infection. The lowest concentration of SARS-CoV-2 viral copies this assay can detect is 250 copies / mL. A negative result does not preclude SARS-CoV-2 infection and should not be used as the sole basis for treatment or other patient management decisions.  A negative result may occur with improper specimen collection / handling, submission of specimen other than nasopharyngeal swab,  presence of viral mutation(s) within the areas targeted by this assay, and inadequate number of viral copies (<250 copies / mL). A negative result must be combined with clinical observations, patient history, and epidemiological information.  Fact Sheet for Patients:   RoadLapTop.co.za  Fact Sheet for Healthcare Providers: http://kim-miller.com/  This test is not yet approved or  cleared by the [place] FDA and has been authorized for detection and/or diagnosis of SARS-CoV-2  by FDA under an Emergency Use Authorization (EUA).  This EUA will remain in effect (meaning this test can be used) for the duration of the COVID-19 declaration under Section 564(b)(1) of the Act, 21 U.S.C. section 360bbb-3(b)(1), unless the authorization is terminated or revoked sooner.  Performed at [facility], [address] [place], [place] [address]   Culture, blood (Routine X 2) w Reflex to ID Panel     Status: None   Collection Time: 07/07/23 10:46 PM   Specimen: BLOOD  Result Value Ref Range Status   Specimen Description BLOOD  LAC  Final   Special Requests   Final    BOTTLES DRAWN AEROBIC AND ANAEROBIC Blood Culture adequate volume   Culture   Final    NO GROWTH 5 DAYS Performed at [facility], [address] [place], [place] [address]    Report Status 07/12/2023 FINAL  Final  MRSA Next Gen by PCR, Nasal     Status: None   Collection Time: 07/07/23 11:50 PM   Specimen: Nasal Mucosa; Nasal Swab  Result Value Ref Range Status   MRSA by PCR Next Gen NOT DETECTED NOT DETECTED Final    Comment: (NOTE) The GeneXpert MRSA Assay (FDA approved for NASAL specimens only), is one component of a comprehensive MRSA colonization surveillance program. It is not intended to diagnose MRSA infection nor to guide or monitor treatment for MRSA infections. Test performance is not FDA approved in patients less than 7 years old. Performed at  [facility], [address] [place], [place] [address]   Aerobic/Anaerobic Culture w Gram Stain (surgical/deep wound)     Status: None (Preliminary result)   Collection Time: 07/09/23  5:01 PM   Specimen: Foot, Left; Wound  Result Value Ref Range Status   Specimen Description WOUND FOOT  Final   Special Requests SWAB SAMPLE A  Final   Gram Stain   Final    FEW WBC PRESENT, PREDOMINANTLY PMN NO ORGANISMS SEEN Performed at [facility], [address], [place], [place] [address]    Culture   Final    RARE DIPHTHEROIDS(CORYNEBACTERIUM SPECIES) Standardized susceptibility testing for this organism is not available. RARE STREPTOCOCCUS MITIS/ORALIS NO ANAEROBES ISOLATED; CULTURE IN PROGRESS FOR 5 DAYS    Report Status PENDING  Incomplete   Organism ID, Bacteria STREPTOCOCCUS MITIS/ORALIS  Final      Susceptibility   Streptococcus mitis/oralis - MIC*    TETRACYCLINE >=16 RESISTANT Resistant     VANCOMYCIN 0.25 SENSITIVE Sensitive     CLINDAMYCIN >=1 RESISTANT Resistant     * RARE STREPTOCOCCUS MITIS/ORALIS  Aerobic/Anaerobic Culture w Gram Stain (surgical/deep wound)     Status: None   Collection Time: 07/09/23  5:17 PM   Specimen: Foot, Left; Wound  Result Value Ref Range Status   Specimen Description BONE LEFT FOOT  Final   Special Requests 1ST METATARSAL SAMPLE B  Final   Gram Stain NO WBC SEEN NO ORGANISMS SEEN   Final   Culture   Final    RARE FINEGOLDIA MAGNA Standardized susceptibility testing for this organism is not available. Performed at [facility], [address], [place], [place] [address]    Report Status 07/12/2023 FINAL  Final  Aerobic/Anaerobic Culture w Gram Stain (surgical/deep wound)     Status: None (Preliminary result)   Collection Time: 07/09/23  5:22 PM   Specimen: Foot, Left; Wound  Result Value Ref Range Status   Specimen Description BONE LEFT FOOT  1ST CUNEIFORM  Final   Special Requests NONE  Final   Gram Stain NO WBC  SEEN NO ORGANISMS SEEN   Final   Culture   Final    NO GROWTH 4 DAYS NO ANAEROBES ISOLATED; CULTURE IN PROGRESS FOR 5 DAYS Performed at [facility], [address] [place], [place] [address]    Report Status PENDING  Incomplete    Labs: CBC: Recent Labs  Lab 07/07/23 1210 07/08/23 0622 07/09/23 0431 07/10/23 0123 07/10/23 2123 07/11/23 0445 07/12/23 1011  WBC 3.7*   < > 14.3* 12.6* 12.1* 10.4 6.3  NEUTROABS 3.3  --  11.2* 12.1*  --   --   --   HGB 8.2*   < > 7.9* 9.3* 7.8* 8.3* 8.6*  HCT 25.2*   < > 23.2* 29.7* 23.3* 25.2* 26.1*  MCV 91.6   < > 86.9 92.8 88.6 88.7 87.9  PLT 304   < > 269 331 260 280 259   < > = values in this interval not displayed.   Basic Metabolic Panel: Recent Labs  Lab 07/08/23 1632 07/08/23 2055 07/10/23 1756 07/10/23 2123 07/11/23 0441 07/11/23 0445 07/12/23 0443 07/13/23 0353  NA 132*   < > 132* 132*  133*  --  133* 136 136  K 4.3   < > 3.8 3.5  3.6  --  3.3* 4.1 3.4*  CL 92*   < > 97* 99  100  --  101 102 101  CO2 14*   < > 20* 19*  21*  --  19* 18* 26  GLUCOSE 344*   < > 147* 155*  158*  --  90 134* 84  BUN 37*   < > 56* 54*  54*  --  54* 51* 22*  CREATININE 6.00*   < > 7.95* 8.04*  7.90*  --  7.97* 8.44* 4.95*  CALCIUM 7.3*   < > 6.6* 6.5*  6.5*  --  6.7* 7.3* 7.5*  MG  --   --   --   --  2.2  --   --   --   PHOS 6.5*  --   --  5.6*  --   --   --   --    < > = values in this interval not displayed.   Liver Function Tests: Recent Labs  Lab 07/07/23 1348 07/08/23 1632 07/10/23 0123 07/10/23 2123  AST 22  --  18  --   ALT 14  --  16  --   ALKPHOS 65  --  70  --   BILITOT 1.5*  --  1.9*  --   PROT 7.1  --  7.6  --   ALBUMIN 2.7* 2.4* 2.9* 2.4*   CBG: Recent Labs  Lab 07/12/23 2032 07/13/23 0018 07/13/23 0337 07/13/23 0838 07/13/23 1205  GLUCAP 127* 72 79 121* 149*    Discharge time spent: greater than 30 minutes.  This record has been created using Conservation officer, historic buildings. Errors have been  sought and corrected,but may not always be located. Such creation errors do not reflect on the standard of care.   Signed: Arnetha Courser, MD [facility] 07/13/2023

## 2023-07-13 NOTE — Plan of Care (Signed)
  Problem: Fluid Volume: Goal: Hemodynamic stability will improve Outcome: Progressing   Problem: Clinical Measurements: Goal: Diagnostic test results will improve Outcome: Progressing Goal: Signs and symptoms of infection will decrease Outcome: Progressing   Problem: Respiratory: Goal: Ability to maintain adequate ventilation will improve Outcome: Progressing   Problem: Education: Goal: Ability to describe self-care measures that may prevent or decrease complications (Diabetes Survival Skills Education) will improve Outcome: Progressing Goal: Individualized Educational Video(s) Outcome: Progressing   Problem: Cardiac: Goal: Ability to maintain an adequate cardiac output will improve Outcome: Progressing   Problem: Health Behavior/Discharge Planning: Goal: Ability to identify and utilize available resources and services will improve Outcome: Progressing Goal: Ability to manage health-related needs will improve Outcome: Progressing   Problem: Fluid Volume: Goal: Ability to achieve a balanced intake and output will improve Outcome: Progressing   Problem: Metabolic: Goal: Ability to maintain appropriate glucose levels will improve Outcome: Progressing   Problem: Nutritional: Goal: Maintenance of adequate nutrition will improve Outcome: Progressing Goal: Maintenance of adequate weight for body size and type will improve Outcome: Progressing   Problem: Respiratory: Goal: Will regain and/or maintain adequate ventilation Outcome: Progressing   Problem: Urinary Elimination: Goal: Ability to achieve and maintain adequate renal perfusion and functioning will improve Outcome: Progressing   Problem: Education: Goal: Ability to describe self-care measures that may prevent or decrease complications (Diabetes Survival Skills Education) will improve Outcome: Progressing Goal: Individualized Educational Video(s) Outcome: Progressing   Problem: Coping: Goal: Ability to adjust  to condition or change in health will improve Outcome: Progressing   Problem: Fluid Volume: Goal: Ability to maintain a balanced intake and output will improve Outcome: Progressing   Problem: Health Behavior/Discharge Planning: Goal: Ability to identify and utilize available resources and services will improve Outcome: Progressing Goal: Ability to manage health-related needs will improve Outcome: Progressing   Problem: Metabolic: Goal: Ability to maintain appropriate glucose levels will improve Outcome: Progressing   Problem: Nutritional: Goal: Maintenance of adequate nutrition will improve Outcome: Progressing Goal: Progress toward achieving an optimal weight will improve Outcome: Progressing   Problem: Skin Integrity: Goal: Risk for impaired skin integrity will decrease Outcome: Progressing   Problem: Tissue Perfusion: Goal: Adequacy of tissue perfusion will improve Outcome: Progressing   Problem: Education: Goal: Knowledge of General Education information will improve Description: Including pain rating scale, medication(s)/side effects and non-pharmacologic comfort measures Outcome: Progressing   Problem: Health Behavior/Discharge Planning: Goal: Ability to manage health-related needs will improve Outcome: Progressing   Problem: Clinical Measurements: Goal: Ability to maintain clinical measurements within normal limits will improve Outcome: Progressing Goal: Will remain free from infection Outcome: Progressing Goal: Diagnostic test results will improve Outcome: Progressing Goal: Respiratory complications will improve Outcome: Progressing Goal: Cardiovascular complication will be avoided Outcome: Progressing   Problem: Activity: Goal: Risk for activity intolerance will decrease Outcome: Progressing   Problem: Nutrition: Goal: Adequate nutrition will be maintained Outcome: Progressing   Problem: Coping: Goal: Level of anxiety will decrease Outcome:  Progressing   Problem: Elimination: Goal: Will not experience complications related to bowel motility Outcome: Progressing Goal: Will not experience complications related to urinary retention Outcome: Progressing   Problem: Pain Managment: Goal: General experience of comfort will improve Outcome: Progressing   Problem: Safety: Goal: Ability to remain free from injury will improve Outcome: Progressing   Problem: Skin Integrity: Goal: Risk for impaired skin integrity will decrease Outcome: Progressing   

## 2023-07-14 LAB — AEROBIC/ANAEROBIC CULTURE W GRAM STAIN (SURGICAL/DEEP WOUND)
Culture: NEGATIVE
Culture: NO GROWTH
Gram Stain: NONE SEEN
Gram Stain: NONE SEEN

## 2023-08-02 ENCOUNTER — Encounter: Payer: Self-pay | Admitting: Podiatry

## 2023-08-03 ENCOUNTER — Inpatient Hospital Stay: Payer: Commercial Managed Care - PPO | Admitting: Infectious Diseases

## 2023-08-05 ENCOUNTER — Inpatient Hospital Stay: Payer: Commercial Managed Care - PPO | Admitting: Infectious Diseases

## 2023-08-13 ENCOUNTER — Emergency Department
Admission: EM | Admit: 2023-08-13 | Discharge: 2023-08-13 | Disposition: A | Discharge status: Home / Self Care | Payer: 59 | Attending: Emergency Medicine | Admitting: Emergency Medicine

## 2023-08-13 ENCOUNTER — Emergency Department: Payer: 59

## 2023-08-13 ENCOUNTER — Other Ambulatory Visit: Payer: Self-pay

## 2023-08-13 DIAGNOSIS — R509 Fever, unspecified: Secondary | ICD-10-CM | POA: Diagnosis present

## 2023-08-13 DIAGNOSIS — D638 Anemia in other chronic diseases classified elsewhere: Secondary | ICD-10-CM

## 2023-08-13 DIAGNOSIS — J189 Pneumonia, unspecified organism: Secondary | ICD-10-CM | POA: Insufficient documentation

## 2023-08-13 DIAGNOSIS — Z992 Dependence on renal dialysis: Secondary | ICD-10-CM | POA: Insufficient documentation

## 2023-08-13 DIAGNOSIS — N186 End stage renal disease: Secondary | ICD-10-CM | POA: Insufficient documentation

## 2023-08-13 DIAGNOSIS — D631 Anemia in chronic kidney disease: Secondary | ICD-10-CM | POA: Diagnosis not present

## 2023-08-13 LAB — CBG MONITORING, ED: Glucose-Capillary: 212 mg/dL — ABNORMAL HIGH (ref 70–99)

## 2023-08-13 LAB — COMPREHENSIVE METABOLIC PANEL
ALT: 13 U/L (ref 0–44)
AST: 16 U/L (ref 15–41)
Albumin: 3.2 g/dL — ABNORMAL LOW (ref 3.5–5.0)
Alkaline Phosphatase: 71 U/L (ref 38–126)
Anion gap: 23 — ABNORMAL HIGH (ref 5–15)
BUN: 57 mg/dL — ABNORMAL HIGH (ref 6–20)
CO2: 18 mmol/L — ABNORMAL LOW (ref 22–32)
Calcium: 7.4 mg/dL — ABNORMAL LOW (ref 8.9–10.3)
Chloride: 90 mmol/L — ABNORMAL LOW (ref 98–111)
Creatinine, Ser: 9.07 mg/dL — ABNORMAL HIGH (ref 0.44–1.00)
GFR, Estimated: 6 mL/min — ABNORMAL LOW (ref >60)
Glucose, Bld: 234 mg/dL — ABNORMAL HIGH (ref 70–99)
Potassium: 4.1 mmol/L (ref 3.5–5.1)
Sodium: 131 mmol/L — ABNORMAL LOW (ref 135–145)
Total Bilirubin: 1.3 mg/dL — ABNORMAL HIGH (ref 0.3–1.2)
Total Protein: 7.6 g/dL (ref 6.5–8.1)

## 2023-08-13 LAB — URINALYSIS, W/ REFLEX TO CULTURE (INFECTION SUSPECTED)
Bilirubin Urine: NEGATIVE
Glucose, UA: 50 mg/dL — AB
Ketones, ur: 5 mg/dL — AB
Nitrite: NEGATIVE
Protein, ur: >=300 mg/dL — AB
Specific Gravity, Urine: 1.017 (ref 1.005–1.030)
Squamous Epithelial / HPF: >50 /[HPF] (ref 0–5)
pH: 6 (ref 5.0–8.0)

## 2023-08-13 LAB — CBC WITH DIFFERENTIAL/PLATELET
Abs Immature Granulocytes: 0.04 10*3/uL (ref 0.00–0.07)
Basophils Absolute: 0.1 10*3/uL (ref 0.0–0.1)
Basophils Relative: 1 %
Eosinophils Absolute: 0.1 10*3/uL (ref 0.0–0.5)
Eosinophils Relative: 1 %
HCT: 20.6 % — ABNORMAL LOW (ref 36.0–46.0)
Hemoglobin: 6.7 g/dL — ABNORMAL LOW (ref 12.0–15.0)
Immature Granulocytes: 0 %
Lymphocytes Relative: 13 %
Lymphs Abs: 1.4 10*3/uL (ref 0.7–4.0)
MCH: 29.4 pg (ref 26.0–34.0)
MCHC: 32.5 g/dL (ref 30.0–36.0)
MCV: 90.4 fL (ref 80.0–100.0)
Monocytes Absolute: 1 10*3/uL (ref 0.1–1.0)
Monocytes Relative: 10 %
Neutro Abs: 7.8 10*3/uL — ABNORMAL HIGH (ref 1.7–7.7)
Neutrophils Relative %: 75 %
Platelets: 318 10*3/uL (ref 150–400)
RBC: 2.28 MIL/uL — ABNORMAL LOW (ref 3.87–5.11)
RDW: 13.3 % (ref 11.5–15.5)
WBC: 10.3 10*3/uL (ref 4.0–10.5)
nRBC: 0 % (ref 0.0–0.2)

## 2023-08-13 LAB — BLOOD GAS, VENOUS
Acid-base deficit: 8.2 mmol/L — ABNORMAL HIGH (ref 0.0–2.0)
Bicarbonate: 16 mmol/L — ABNORMAL LOW (ref 20.0–28.0)
O2 Saturation: 88.7 %
Patient temperature: 37
pCO2, Ven: 29 mm[Hg] — ABNORMAL LOW (ref 44–60)
pH, Ven: 7.35 (ref 7.25–7.43)
pO2, Ven: 56 mm[Hg] — ABNORMAL HIGH (ref 32–45)

## 2023-08-13 LAB — TROPONIN I (HIGH SENSITIVITY): Troponin I (High Sensitivity): <2 ng/L (ref <18)

## 2023-08-13 LAB — PREPARE RBC (CROSSMATCH)

## 2023-08-13 LAB — LACTIC ACID, PLASMA: Lactic Acid, Venous: 1 mmol/L (ref 0.5–1.9)

## 2023-08-13 MED ORDER — AZITHROMYCIN 250 MG PO TABS: ORAL_TABLET | ORAL | 0 refills | Status: AC

## 2023-08-13 MED ORDER — SODIUM CHLORIDE 0.9 % IV SOLN
1.0000 g | Freq: Once | INTRAVENOUS | Status: AC
  Administered 2023-08-13: 1 g via INTRAVENOUS
  Filled 2023-08-13: qty 10

## 2023-08-13 MED ORDER — SODIUM CHLORIDE 0.9 % IV SOLN
500.0000 mg | Freq: Once | INTRAVENOUS | Status: AC
  Administered 2023-08-13: 500 mg via INTRAVENOUS
  Filled 2023-08-13: qty 5

## 2023-08-13 MED ORDER — SODIUM CHLORIDE 0.9 % IV SOLN
10.0000 mL/h | Freq: Once | INTRAVENOUS | Status: AC
  Administered 2023-08-13: 10 mL/h via INTRAVENOUS

## 2023-08-13 MED ORDER — HYDROCOD POLI-CHLORPHE POLI ER 10-8 MG/5ML PO SUER
5.0000 mL | Freq: Once | ORAL | Status: AC
  Administered 2023-08-13: 5 mL via ORAL
  Filled 2023-08-13: qty 5

## 2023-08-13 MED ORDER — HYDROMORPHONE HCL 1 MG/ML IJ SOLN
1.0000 mg | Freq: Once | INTRAMUSCULAR | Status: AC
  Administered 2023-08-13: 1 mg via INTRAVENOUS
  Filled 2023-08-13: qty 1

## 2023-08-13 NOTE — ED Notes (Addendum)
Pt states she has chest pain and at times difficulty breathing. States she has difficulty breathing at times as well. Had her left big toe amputated and states she has a Hx of infection in it. Foot is swollen as well. Pt is a dialysis Pt.

## 2023-08-13 NOTE — Discharge Instructions (Addendum)
Please use ibuprofen (Motrin) up to 800 mg every 8 hours, naproxen (Naprosyn) up to 500 mg every 12 hours, and/or acetaminophen (Tylenol) up to 4 g/day for any continued pain.  Please do not use this medication regimen for longer than 7 days

## 2023-08-13 NOTE — Inpatient Diabetes Management (Addendum)
Inpatient Diabetes Program Recommendations  AACE/ADA: New Consensus Statement on Inpatient Glycemic Control   Target Ranges:  Prepandial:   less than 140 mg/dL      Peak postprandial:   less than 180 mg/dL (1-2 hours)      Critically ill patients:  140 - 180 mg/dL    Latest Reference Range & Units 08/13/23 10:11  Glucose-Capillary 70 - 99 mg/dL 161 (H)    Latest Reference Range & Units 08/13/23 10:20  CO2 22 - 32 mmol/L 18 (L)  Glucose 70 - 99 mg/dL 096 (H)  BUN 6 - 20 mg/dL 57 (H)  Creatinine 0.45 - 1.00 mg/dL 4.09 (H)  Calcium 8.9 - 10.3 mg/dL 7.4 (L)  Anion gap 5 - 15  23 (H)   Review of Glycemic Control  Diabetes history: DM1 (does NOT make any insulin; requires basal, correction, and carbohydrate coverage insulin) Outpatient Diabetes medications: T-Slim insulin pump with Humalog; Dexcom G7 CGM Current orders for Inpatient glycemic control: None; in ED  Inpatient Recommendations:  Insulin: Vickii Chafe, RN asked patient about insulin pump and she does not currently have her pump on and did not bring it to the hospital. If patient is not in DKA and will be admitted, recommend ordering Semglee 10 units Q24H, CBGs AC&HS, Novolog 0-9 units TID with meals, Novolog 0-5 units at bedtime.   NOTE: Patient has DM1 and sees [facility] for DM management and is well known to inpatient diabetes team due to frequent ED visit and hospital admissions. Patient was most recently inpatient 07/10/23-07/13/23.  Patient had televisit with Demaris Callander, PA ([facility]) on 04/06/23. Per office note on 04/06/23, patient's CBGs had been running high so "HIGH" profile was created. The following should be pump settings:   Dexcom - linked to clinic Tandem - phone app - linked to clinic  Normal         Basal   Insulin Sensitivity   Carb Ratio 12a  0.5      65                            8 3a    0.5      65                            8 6a    0.6      50                            8 11a   0.6      50                            8 4p     0.8     40                            7 9p     0.6     55                             8 Total basal: 17.6 units/24 hours  HIGH         Basal    Insulin Sensitivity   Carb Ratio 12a  0.8  40                           7 8a    0.9        35                           6 4p    1.1        30                           6  9p    0.9        35                           7  Total basal: 21.8 units/24 hours  Patient is currently in ED with "feeling terrible", fever, chills, chest pain, and shortness of breath. Unsure of plan at this time.  Thanks, Orlando Penner, RN, MSN, CDCES Diabetes Coordinator Inpatient Diabetes Program [phone] (Team Pager from 8am to 5pm)

## 2023-08-13 NOTE — ED Notes (Signed)
Pt sleeping at this time, pt requesting pain medication. ERP notified of pt request.

## 2023-08-13 NOTE — ED Notes (Signed)
Pt not currently on insulin pump, T1DM

## 2023-08-13 NOTE — ED Provider Notes (Signed)
[facility] Provider Note   Event Date/Time   First MD Initiated Contact with Patient 08/13/23 1029     (approximate) History  Hyperglycemia and Fever  HPI Emma Thomas is a 27 y.o. female with a past medical history of end-stage renal disease on dialysis who presents complaining of sore throat, fatigue, and fever that have been present over the last 3 days.  Patient states that she began having chest pain with shortness of breath over the last 24 hours after coughing.  Patient states that taking a deep inhalation or coughing worsens her chest..  Patient states that she has been taking Tylenol for her fevers with good effect.  Of note, patient states she is concerned that she may have osteomyelitis in her left toe status post amputation.  Patient states that she does not have any definitive signs or symptoms of osteomyelitis but she is worried as she had no signs or symptoms before and they had to amputate her toe. ROS: Patient currently denies any vision changes, tinnitus, difficulty speaking, facial droop, abdominal pain, nausea/vomiting/diarrhea, dysuria, or weakness/numbness/paresthesias in any extremity   Physical Exam  Triage Vital Signs: ED Triage Vitals [08/13/23 1014]  Encounter Vitals Group     BP 121/76     Systolic BP Percentile      Diastolic BP Percentile      Pulse Rate 87     Resp 20     Temp 99.2 F (37.3 C)     Temp Source Oral     SpO2 100 %     Weight 160 lb (72.6 kg)     Height 5\' 6"  (1.676 m)     Head Circumference      Peak Flow      Pain Score 8     Pain Loc      Pain Education      Exclude from Growth Chart    Most recent vital signs: Vitals:   08/13/23 1430 08/13/23 1500  BP: 129/86 125/80  Pulse: 88 87  Resp: (!) 29 (!) 26  Temp:    SpO2: 97% 98%   General: Awake, oriented x4. CV:  Good peripheral perfusion.  Resp:  Normal effort.  Abd:  No distention.  Other:  Young adult well-developed, well-developed  African-American female resting comfortably in no acute distress ED Results / Procedures / Treatments  Labs (all labs ordered are listed, but only abnormal results are displayed) Labs Reviewed  COMPREHENSIVE METABOLIC PANEL - Abnormal; Notable for the following components:      Result Value   Sodium 131 (*)    Chloride 90 (*)    CO2 18 (*)    Glucose, Bld 234 (*)    BUN 57 (*)    Creatinine, Ser 9.07 (*)    Calcium 7.4 (*)    Albumin 3.2 (*)    Total Bilirubin 1.3 (*)    GFR, Estimated 6 (*)    Anion gap 23 (*)    All other components within normal limits  CBC WITH DIFFERENTIAL/PLATELET - Abnormal; Notable for the following components:   RBC 2.28 (*)    Hemoglobin 6.7 (*)    HCT 20.6 (*)    Neutro Abs 7.8 (*)    All other components within normal limits  URINALYSIS, W/ REFLEX TO CULTURE (INFECTION SUSPECTED) - Abnormal; Notable for the following components:   Color, Urine YELLOW (*)    APPearance CLOUDY (*)    Glucose, UA 50 (*)  Hgb urine dipstick SMALL (*)    Ketones, ur 5 (*)    Protein, ur >=300 (*)    Leukocytes,Ua TRACE (*)    Bacteria, UA FEW (*)    All other components within normal limits  BLOOD GAS, VENOUS - Abnormal; Notable for the following components:   pCO2, Ven 29 (*)    pO2, Ven 56 (*)    Bicarbonate 16.0 (*)    Acid-base deficit 8.2 (*)    All other components within normal limits  CBG MONITORING, ED - Abnormal; Notable for the following components:   Glucose-Capillary 212 (*)    All other components within normal limits  LACTIC ACID, PLASMA  LACTIC ACID, PLASMA  POC URINE PREG, ED  PREPARE RBC (CROSSMATCH)  TYPE AND SCREEN  TROPONIN I (HIGH SENSITIVITY)  TROPONIN I (HIGH SENSITIVITY)   EKG ED ECG REPORT I, Merwyn Katos, the attending physician, personally viewed and interpreted this ECG. Date: 08/13/2023 EKG Time: 1017 Rate: 88 Rhythm: normal sinus rhythm QRS Axis: normal Intervals: normal ST/T Wave abnormalities: normal Narrative  Interpretation: no evidence of acute ischemia RADIOLOGY ED MD interpretation: X-ray of the left foot shows small amount of bone erosion and minimal fragmentation of the distal lateral aspect of remaining portion of the medial cuneiform suspicious for possible osteomyelitis -Agree with radiology assessment Official radiology report(s): DG Foot Complete Left  Result Date: 08/13/2023 CLINICAL DATA:  Pain and fever at the site of a left great toe amputation for the past 2 days. The foot is also swollen. EXAM: LEFT FOOT - COMPLETE 3+ VIEW COMPARISON:  07/09/2023, 07/07/2023 and MRI dated 06/18/2023. FINDINGS: Diffuse soft tissue swelling, most pronounced distally. The previously demonstrated soft tissue gas is no longer seen. 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 There is a small amount of bone erosion and minimal fragmentation at the distal, lateral aspect of the remaining portion of the medial cuneiform. 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 IMPRESSION: 1. Small amount of bone erosion and minimal fragmentation at the distal, lateral aspect of the remaining portion of the medial cuneiform, suspicious for osteomyelitis. 2. Diffuse soft tissue swelling, most pronounced distally. 3. 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 Electronically Signed   By: Beckie Salts M.D.   On: 08/13/2023 13:05   DG Chest 2 View  Result Date: 08/13/2023 CLINICAL DATA:  [id number] Chest pain [id number] EXAM: CHEST - 2 VIEW COMPARISON:  07/07/2023. FINDINGS: Probable patchy atelectatic changes at the left lung base. Bilateral lung fields are otherwise clear. No acute consolidation or major lung collapse. Bilateral costophrenic angles are clear. Normal cardio-mediastinal silhouette. No acute osseous abnormalities. The soft  tissues are within normal limits. IMPRESSION: 1. Probable patchy atelectatic changes at the left lung base. Electronically Signed   By: Jules Schick M.D.   On: 08/13/2023 11:17   PROCEDURES: Critical Care performed: No .1-3 Lead EKG Interpretation  Performed by: Merwyn Katos, MD Authorized by: Merwyn Katos, MD     Interpretation: normal     ECG rate:  71   ECG rate assessment: normal     Rhythm: sinus rhythm     Ectopy: none     Conduction: normal    MEDICATIONS ORDERED IN ED: Medications  0.9 %  sodium chloride infusion (has no administration in time range)  chlorpheniramine-HYDROcodone (TUSSIONEX)  10-8 MG/5ML suspension 5 mL (5 mLs Oral Given 08/13/23 1143)  HYDROmorphone (DILAUDID) injection 1 mg (1 mg Intravenous Given 08/13/23 1307)  cefTRIAXone (ROCEPHIN) 1 g in sodium chloride 0.9 % 100 mL IVPB (0 g Intravenous Stopped 08/13/23 1605)  azithromycin (ZITHROMAX) 500 mg in sodium chloride 0.9 % 250 mL IVPB (0 mg Intravenous Stopped 08/13/23 1605)   IMPRESSION / MDM / ASSESSMENT AND PLAN / ED COURSE  I reviewed the triage vital signs and the nursing notes.                             The patient is on the cardiac monitor to evaluate for evidence of arrhythmia and/or significant heart rate changes. Patient's presentation is most consistent with acute presentation with potential threat to life or bodily function. Presents with fever, chest pain, shortness of breath, cough, and malaise concerning for pneumonia. DDx: PE, COPD exacerbation, Pneumothorax, TB, Atypical ACS, Esophageal Rupture, Toxic Exposure, Foreign Body Airway Obstruction. Workup: CXR CBC, CMP, Trop, Lactate  Given History, Exam, and Workup presentation most consistent with pneumonia.  Findings:  Single Focus Consolidation in the left lower lobe on CXR  Rx: Z-Pak  1502 Reassessment: Patient resting comfortably in no respiratory distress.  Patient not requiring any supplemental oxygen and agrees with plan for  discharge home. I discussed the patient's the results of her foot x-ray that was concerning for possible osteomyelitis.  Patient states that she has follow-up with her orthopedic surgeon and wishes to discuss it with them.  Patient has no white blood cell count at this time and no lactic acidosis that would be concerning for significant osteomyelitis. Disposition: Discharge home. Return precautions discussed at bedside and patient in agreement with plan. Prompt follow up with primary care provider advised.   FINAL CLINICAL IMPRESSION(S) / ED DIAGNOSES   Final diagnoses:  Fever, unspecified fever cause  Pulmonary infection  Anemia of chronic disease   Rx / DC Orders   ED Discharge Orders          Ordered    azithromycin (ZITHROMAX Z-PAK) 250 MG tablet        08/13/23 1547           Note:  This document was prepared using Dragon voice recognition software and may include unintentional dictation errors.   Merwyn Katos, MD 08/13/23 [phone]

## 2023-08-13 NOTE — ED Triage Notes (Signed)
Pt here POV for "feeling terrible" for the last week, highest temp 101.7 F, chills, chest pain with breathing. Thinks she is going into DKA from a foot wound that hasn't been healing. Blood sugars at home has not been over 250 per pt. CBG here 212

## 2023-08-14 LAB — TYPE AND SCREEN
ABO/RH(D): O POS
Antibody Screen: NEGATIVE
Unit division: 0

## 2023-08-14 LAB — BPAM RBC
Blood Product Expiration Date: 202410282359
ISSUE DATE / TIME: 202409271246
Unit Type and Rh: 5100

## 2023-08-17 ENCOUNTER — Inpatient Hospital Stay: Payer: Commercial Managed Care - PPO | Admitting: Infectious Diseases

## 2023-08-22 ENCOUNTER — Inpatient Hospital Stay
Admission: EM | Admit: 2023-08-22 | Discharge: 2023-08-26 | DRG: 871 | Disposition: A | Discharge status: Home / Self Care | Payer: 59 | Attending: Internal Medicine | Admitting: Internal Medicine

## 2023-08-22 ENCOUNTER — Emergency Department: Payer: 59

## 2023-08-22 ENCOUNTER — Encounter: Payer: Self-pay | Admitting: Intensive Care

## 2023-08-22 ENCOUNTER — Other Ambulatory Visit: Payer: Self-pay

## 2023-08-22 DIAGNOSIS — E872 Acidosis, unspecified: Secondary | ICD-10-CM | POA: Diagnosis present

## 2023-08-22 DIAGNOSIS — R1013 Epigastric pain: Secondary | ICD-10-CM | POA: Diagnosis not present

## 2023-08-22 DIAGNOSIS — D631 Anemia in chronic kidney disease: Secondary | ICD-10-CM | POA: Diagnosis present

## 2023-08-22 DIAGNOSIS — R54 Age-related physical debility: Secondary | ICD-10-CM | POA: Diagnosis present

## 2023-08-22 DIAGNOSIS — E876 Hypokalemia: Secondary | ICD-10-CM | POA: Diagnosis present

## 2023-08-22 DIAGNOSIS — E1065 Type 1 diabetes mellitus with hyperglycemia: Secondary | ICD-10-CM | POA: Diagnosis present

## 2023-08-22 DIAGNOSIS — Z794 Long term (current) use of insulin: Secondary | ICD-10-CM | POA: Diagnosis not present

## 2023-08-22 DIAGNOSIS — R0781 Pleurodynia: Secondary | ICD-10-CM | POA: Diagnosis not present

## 2023-08-22 DIAGNOSIS — E1069 Type 1 diabetes mellitus with other specified complication: Secondary | ICD-10-CM | POA: Diagnosis present

## 2023-08-22 DIAGNOSIS — R6520 Severe sepsis without septic shock: Secondary | ICD-10-CM | POA: Diagnosis present

## 2023-08-22 DIAGNOSIS — Z833 Family history of diabetes mellitus: Secondary | ICD-10-CM | POA: Diagnosis not present

## 2023-08-22 DIAGNOSIS — N186 End stage renal disease: Secondary | ICD-10-CM | POA: Diagnosis present

## 2023-08-22 DIAGNOSIS — E785 Hyperlipidemia, unspecified: Secondary | ICD-10-CM | POA: Diagnosis present

## 2023-08-22 DIAGNOSIS — Z803 Family history of malignant neoplasm of breast: Secondary | ICD-10-CM | POA: Diagnosis not present

## 2023-08-22 DIAGNOSIS — G43909 Migraine, unspecified, not intractable, without status migrainosus: Secondary | ICD-10-CM | POA: Diagnosis present

## 2023-08-22 DIAGNOSIS — N189 Chronic kidney disease, unspecified: Secondary | ICD-10-CM | POA: Diagnosis present

## 2023-08-22 DIAGNOSIS — Z1152 Encounter for screening for COVID-19: Secondary | ICD-10-CM | POA: Diagnosis not present

## 2023-08-22 DIAGNOSIS — Z91158 Patient's noncompliance with renal dialysis for other reason: Secondary | ICD-10-CM | POA: Diagnosis not present

## 2023-08-22 DIAGNOSIS — Z992 Dependence on renal dialysis: Secondary | ICD-10-CM | POA: Diagnosis not present

## 2023-08-22 DIAGNOSIS — R0602 Shortness of breath: Secondary | ICD-10-CM | POA: Diagnosis present

## 2023-08-22 DIAGNOSIS — J189 Pneumonia, unspecified organism: Principal | ICD-10-CM

## 2023-08-22 DIAGNOSIS — F331 Major depressive disorder, recurrent, moderate: Secondary | ICD-10-CM | POA: Diagnosis present

## 2023-08-22 DIAGNOSIS — Z801 Family history of malignant neoplasm of trachea, bronchus and lung: Secondary | ICD-10-CM

## 2023-08-22 DIAGNOSIS — E871 Hypo-osmolality and hyponatremia: Secondary | ICD-10-CM | POA: Diagnosis present

## 2023-08-22 DIAGNOSIS — Z87440 Personal history of urinary (tract) infections: Secondary | ICD-10-CM

## 2023-08-22 DIAGNOSIS — I10 Essential (primary) hypertension: Secondary | ICD-10-CM | POA: Diagnosis not present

## 2023-08-22 DIAGNOSIS — E8729 Other acidosis: Secondary | ICD-10-CM | POA: Diagnosis present

## 2023-08-22 DIAGNOSIS — A419 Sepsis, unspecified organism: Principal | ICD-10-CM | POA: Diagnosis present

## 2023-08-22 DIAGNOSIS — Z888 Allergy status to other drugs, medicaments and biological substances status: Secondary | ICD-10-CM

## 2023-08-22 DIAGNOSIS — I120 Hypertensive chronic kidney disease with stage 5 chronic kidney disease or end stage renal disease: Secondary | ICD-10-CM | POA: Diagnosis present

## 2023-08-22 DIAGNOSIS — Z79899 Other long term (current) drug therapy: Secondary | ICD-10-CM | POA: Diagnosis not present

## 2023-08-22 DIAGNOSIS — N2581 Secondary hyperparathyroidism of renal origin: Secondary | ICD-10-CM | POA: Diagnosis present

## 2023-08-22 DIAGNOSIS — R0789 Other chest pain: Secondary | ICD-10-CM | POA: Insufficient documentation

## 2023-08-22 DIAGNOSIS — E1022 Type 1 diabetes mellitus with diabetic chronic kidney disease: Secondary | ICD-10-CM | POA: Diagnosis present

## 2023-08-22 DIAGNOSIS — Z789 Other specified health status: Secondary | ICD-10-CM

## 2023-08-22 LAB — RESP PANEL BY RT-PCR (RSV, FLU A&B, COVID)  RVPGX2
Influenza A by PCR: NEGATIVE
Influenza B by PCR: NEGATIVE
Resp Syncytial Virus by PCR: NEGATIVE
SARS Coronavirus 2 by RT PCR: NEGATIVE

## 2023-08-22 LAB — CBC
HCT: 20.9 % — ABNORMAL LOW (ref 36.0–46.0)
Hemoglobin: 7.3 g/dL — ABNORMAL LOW (ref 12.0–15.0)
MCH: 29.8 pg (ref 26.0–34.0)
MCHC: 34.9 g/dL (ref 30.0–36.0)
MCV: 85.3 fL (ref 80.0–100.0)
Platelets: 523 10*3/uL — ABNORMAL HIGH (ref 150–400)
RBC: 2.45 MIL/uL — ABNORMAL LOW (ref 3.87–5.11)
RDW: 13.5 % (ref 11.5–15.5)
WBC: 17 10*3/uL — ABNORMAL HIGH (ref 4.0–10.5)
nRBC: 0 % (ref 0.0–0.2)

## 2023-08-22 LAB — BASIC METABOLIC PANEL
Anion gap: 22 — ABNORMAL HIGH (ref 5–15)
BUN: 71 mg/dL — ABNORMAL HIGH (ref 6–20)
CO2: 15 mmol/L — ABNORMAL LOW (ref 22–32)
Calcium: 7.9 mg/dL — ABNORMAL LOW (ref 8.9–10.3)
Chloride: 93 mmol/L — ABNORMAL LOW (ref 98–111)
Creatinine, Ser: 13.83 mg/dL — ABNORMAL HIGH (ref 0.44–1.00)
GFR, Estimated: 3 mL/min — ABNORMAL LOW (ref >60)
Glucose, Bld: 172 mg/dL — ABNORMAL HIGH (ref 70–99)
Potassium: 3.8 mmol/L (ref 3.5–5.1)
Sodium: 130 mmol/L — ABNORMAL LOW (ref 135–145)

## 2023-08-22 LAB — LACTIC ACID, PLASMA: Lactic Acid, Venous: 1 mmol/L (ref 0.5–1.9)

## 2023-08-22 LAB — PROCALCITONIN: Procalcitonin: 0.33 ng/mL

## 2023-08-22 LAB — CBG MONITORING, ED
Glucose-Capillary: 283 mg/dL — ABNORMAL HIGH (ref 70–99)
Glucose-Capillary: 168 mg/dL — ABNORMAL HIGH (ref 70–99)

## 2023-08-22 LAB — TROPONIN I (HIGH SENSITIVITY)
Troponin I (High Sensitivity): 7 ng/L (ref <18)
Troponin I (High Sensitivity): 5 ng/L (ref <18)

## 2023-08-22 LAB — BRAIN NATRIURETIC PEPTIDE: B Natriuretic Peptide: 925.7 pg/mL — ABNORMAL HIGH (ref 0.0–100.0)

## 2023-08-22 MED ORDER — RENA-VITE PO TABS
1.0000 | ORAL_TABLET | Freq: Every day | ORAL | Status: DC
  Administered 2023-08-25: 1 via ORAL
  Filled 2023-08-22 (×4): qty 1

## 2023-08-22 MED ORDER — SEVELAMER CARBONATE 800 MG PO TABS
800.0000 mg | ORAL_TABLET | ORAL | Status: DC
  Filled 2023-08-22: qty 1

## 2023-08-22 MED ORDER — SODIUM CHLORIDE 0.9 % IV SOLN
1.0000 g | INTRAVENOUS | Status: DC
  Filled 2023-08-22: qty 10

## 2023-08-22 MED ORDER — ACETAMINOPHEN 325 MG RE SUPP: 650.0000 mg | Freq: Four times a day (QID) | RECTAL | Status: DC | PRN

## 2023-08-22 MED ORDER — GUAIFENESIN 100 MG/5ML PO LIQD: 5.0000 mL | ORAL | Status: DC | PRN

## 2023-08-22 MED ORDER — SODIUM CHLORIDE 0.9 % IV SOLN
500.0000 mg | INTRAVENOUS | Status: DC
Start: 2023-08-22 — End: 2023-08-22

## 2023-08-22 MED ORDER — ACETAMINOPHEN 650 MG RE SUPP: 650.0000 mg | Freq: Four times a day (QID) | RECTAL | Status: AC | PRN

## 2023-08-22 MED ORDER — VANCOMYCIN HCL 1250 MG/250ML IV SOLN
1250.0000 mg | Freq: Once | INTRAVENOUS | Status: AC
  Administered 2023-08-22: 1250 mg via INTRAVENOUS
  Filled 2023-08-22: qty 250

## 2023-08-22 MED ORDER — HYDROCOD POLI-CHLORPHE POLI ER 10-8 MG/5ML PO SUER
5.0000 mL | Freq: Every evening | ORAL | Status: DC | PRN
  Administered 2023-08-23 (×2): 5 mL via ORAL
  Filled 2023-08-22 (×3): qty 5

## 2023-08-22 MED ORDER — VITAMIN D (ERGOCALCIFEROL) 1.25 MG (50000 UNIT) PO CAPS
50000.0000 [IU] | ORAL_CAPSULE | ORAL | Status: DC
  Filled 2023-08-22: qty 1

## 2023-08-22 MED ORDER — LIDOCAINE 5 % EX PTCH
1.0000 | MEDICATED_PATCH | Freq: Every day | CUTANEOUS | Status: DC | PRN
  Administered 2023-08-23: 1 via TRANSDERMAL
  Filled 2023-08-22 (×2): qty 1

## 2023-08-22 MED ORDER — ONDANSETRON HCL 4 MG/2ML IJ SOLN
4.0000 mg | Freq: Four times a day (QID) | INTRAMUSCULAR | Status: DC | PRN
  Administered 2023-08-23 – 2023-08-24 (×3): 4 mg via INTRAVENOUS
  Filled 2023-08-22 (×3): qty 2

## 2023-08-22 MED ORDER — ACETAMINOPHEN 325 MG PO TABS: 650.0000 mg | ORAL_TABLET | Freq: Four times a day (QID) | ORAL | Status: DC | PRN

## 2023-08-22 MED ORDER — LACTATED RINGERS IV SOLN: 150.0000 mL/h | INTRAVENOUS | Status: DC

## 2023-08-22 MED ORDER — ONDANSETRON HCL 4 MG/2ML IJ SOLN
4.0000 mg | Freq: Once | INTRAMUSCULAR | Status: AC
  Administered 2023-08-22: 4 mg via INTRAVENOUS
  Filled 2023-08-22: qty 2

## 2023-08-22 MED ORDER — HYDROMORPHONE HCL 1 MG/ML IJ SOLN
1.0000 mg | Freq: Once | INTRAMUSCULAR | Status: AC
  Administered 2023-08-22: 1 mg via INTRAVENOUS
  Filled 2023-08-22: qty 1

## 2023-08-22 MED ORDER — SEVELAMER CARBONATE 800 MG PO TABS
1600.0000 mg | ORAL_TABLET | Freq: Three times a day (TID) | ORAL | Status: DC
  Filled 2023-08-22: qty 2

## 2023-08-22 MED ORDER — ACETAMINOPHEN 10 MG/ML IV SOLN
1000.0000 mg | Freq: Once | INTRAVENOUS | Status: AC
  Administered 2023-08-22: 1000 mg via INTRAVENOUS
  Filled 2023-08-22: qty 100

## 2023-08-22 MED ORDER — SODIUM CHLORIDE 0.9 % IV SOLN
2.0000 g | Freq: Once | INTRAVENOUS | Status: AC
  Administered 2023-08-22: 2 g via INTRAVENOUS
  Filled 2023-08-22: qty 12.5

## 2023-08-22 MED ORDER — ATORVASTATIN CALCIUM 80 MG PO TABS
80.0000 mg | ORAL_TABLET | Freq: Every day | ORAL | Status: DC
  Administered 2023-08-23 – 2023-08-25 (×2): 80 mg via ORAL
  Filled 2023-08-22: qty 1
  Filled 2023-08-22: qty 4
  Filled 2023-08-22 (×2): qty 1

## 2023-08-22 MED ORDER — SUMATRIPTAN SUCCINATE 50 MG PO TABS: 50.0000 mg | ORAL_TABLET | Freq: Every day | ORAL | Status: DC | PRN

## 2023-08-22 MED ORDER — SODIUM CHLORIDE 0.9 % IV SOLN
12.5000 mg | Freq: Four times a day (QID) | INTRAVENOUS | Status: AC | PRN
  Administered 2023-08-22 – 2023-08-23 (×3): 12.5 mg via INTRAVENOUS
  Filled 2023-08-22 (×3): qty 12.5

## 2023-08-22 MED ORDER — CYCLOBENZAPRINE HCL 10 MG PO TABS
5.0000 mg | ORAL_TABLET | Freq: Three times a day (TID) | ORAL | Status: AC | PRN
  Administered 2023-08-23 (×2): 5 mg via ORAL
  Filled 2023-08-22 (×2): qty 1

## 2023-08-22 MED ORDER — SEVELAMER CARBONATE 800 MG PO TABS: 800.0000 mg | ORAL_TABLET | ORAL | Status: DC

## 2023-08-22 MED ORDER — ONDANSETRON HCL 4 MG PO TABS
4.0000 mg | ORAL_TABLET | Freq: Four times a day (QID) | ORAL | Status: DC | PRN
  Administered 2023-08-22: 4 mg via ORAL
  Filled 2023-08-22: qty 1

## 2023-08-22 MED ORDER — FUROSEMIDE 10 MG/ML IJ SOLN
40.0000 mg | Freq: Once | INTRAMUSCULAR | Status: AC
  Administered 2023-08-22: 40 mg via INTRAVENOUS
  Filled 2023-08-22: qty 4

## 2023-08-22 MED ORDER — ACETAMINOPHEN 500 MG PO TABS: 1000.0000 mg | ORAL_TABLET | Freq: Four times a day (QID) | ORAL | Status: DC | PRN

## 2023-08-22 MED ORDER — HYDRALAZINE HCL 20 MG/ML IJ SOLN: 5.0000 mg | Freq: Four times a day (QID) | INTRAMUSCULAR | Status: DC | PRN

## 2023-08-22 MED ORDER — VANCOMYCIN HCL 750 MG/150ML IV SOLN: 750.0000 mg | INTRAVENOUS | Status: DC | PRN

## 2023-08-22 MED ORDER — SENNOSIDES-DOCUSATE SODIUM 8.6-50 MG PO TABS: 1.0000 | ORAL_TABLET | Freq: Every evening | ORAL | Status: DC | PRN

## 2023-08-22 MED ORDER — ACETAMINOPHEN 500 MG PO TABS
1000.0000 mg | ORAL_TABLET | Freq: Four times a day (QID) | ORAL | Status: AC | PRN
  Filled 2023-08-22: qty 2

## 2023-08-22 MED ORDER — MONTELUKAST SODIUM 10 MG PO TABS
10.0000 mg | ORAL_TABLET | Freq: Every day | ORAL | Status: DC
  Administered 2023-08-22 – 2023-08-25 (×3): 10 mg via ORAL
  Filled 2023-08-22 (×4): qty 1

## 2023-08-22 MED ORDER — INSULIN ASPART 100 UNIT/ML IJ SOLN: 0.0000 [IU] | Freq: Every day | INTRAMUSCULAR | Status: DC

## 2023-08-22 MED ORDER — INSULIN ASPART 100 UNIT/ML IJ SOLN
0.0000 [IU] | Freq: Three times a day (TID) | INTRAMUSCULAR | Status: DC
  Administered 2023-08-22: 3 [IU] via SUBCUTANEOUS
  Administered 2023-08-23: 4 [IU] via SUBCUTANEOUS
  Filled 2023-08-22 (×2): qty 1

## 2023-08-22 MED ORDER — VANCOMYCIN HCL IN DEXTROSE 1-5 GM/200ML-% IV SOLN
1000.0000 mg | Freq: Once | INTRAVENOUS | Status: DC
  Filled 2023-08-22: qty 200

## 2023-08-22 MED ORDER — BUMETANIDE 1 MG PO TABS
1.0000 mg | ORAL_TABLET | Freq: Two times a day (BID) | ORAL | Status: DC
  Administered 2023-08-24 – 2023-08-26 (×4): 1 mg via ORAL
  Filled 2023-08-22 (×9): qty 1

## 2023-08-22 MED ORDER — HEPARIN SODIUM (PORCINE) 5000 UNIT/ML IJ SOLN
5000.0000 [IU] | Freq: Three times a day (TID) | INTRAMUSCULAR | Status: DC
  Administered 2023-08-24 – 2023-08-26 (×4): 5000 [IU] via SUBCUTANEOUS
  Filled 2023-08-22 (×6): qty 1

## 2023-08-22 MED ORDER — VITAMIN D (ERGOCALCIFEROL) 1.25 MG (50000 UNIT) PO CAPS: 50000.0000 [IU] | ORAL_CAPSULE | ORAL | Status: DC

## 2023-08-22 NOTE — ED Provider Notes (Signed)
[facility] Provider Note    Event Date/Time   First MD Initiated Contact with Patient 08/22/23 1157     (approximate)   History   Chief Complaint: Chest Pain and Shortness of Breath   HPI  Emma Thomas is a 27 y.o. female with a history of hypertension diabetes ESRD on hemodialysis at home who comes to the ED complaining of shortness of breath and chest tightness, gradually worsening for the past 10 days.  Reports that she recently completed a course of antibiotics for pneumonia but is not noticing any improvement.  No vomiting or diarrhea.  Has not done dialysis in the last week due to feeling sick.  She does make urine but has noticed decreased urine output.  She endorses lower extremity edema.     Physical Exam   Triage Vital Signs: ED Triage Vitals  Encounter Vitals Group     BP 08/22/23 1116 (!) 133/93     Systolic BP Percentile --      Diastolic BP Percentile --      Pulse Rate 08/22/23 1116 98     Resp 08/22/23 1116 20     Temp 08/22/23 1116 98.2 F (36.8 C)     Temp Source 08/22/23 1116 Oral     SpO2 08/22/23 1116 100 %     Weight 08/22/23 1117 160 lb (72.6 kg)     Height 08/22/23 1117 5\' 6"  (1.676 m)     Head Circumference --      Peak Flow --      Pain Score --      Pain Loc --      Pain Education --      Exclude from Growth Chart --     Most recent vital signs: Vitals:   08/22/23 1116  BP: (!) 133/93  Pulse: 98  Resp: 20  Temp: 98.2 F (36.8 C)  SpO2: 100%    General: Awake, no distress.  CV:  Good peripheral perfusion.  Resp:  Mild tachypnea.  No accessory muscle use. Abd:  No distention.  Soft nontender, had 1 episode of vomiting during my exam Other:  Right upper extremity AV fistula with strong thrill.  1+ pitting edema bilateral lower extremities, symmetric.   ED Results / Procedures / Treatments   Labs (all labs ordered are listed, but only abnormal results are displayed) Labs Reviewed  BASIC METABOLIC  PANEL - Abnormal; Notable for the following components:      Result Value   Sodium 130 (*)    Chloride 93 (*)    CO2 15 (*)    Glucose, Bld 172 (*)    BUN 71 (*)    Creatinine, Ser 13.83 (*)    Calcium 7.9 (*)    GFR, Estimated 3 (*)    Anion gap 22 (*)    All other components within normal limits  CBC - Abnormal; Notable for the following components:   WBC 17.0 (*)    RBC 2.45 (*)    Hemoglobin 7.3 (*)    HCT 20.9 (*)    Platelets 523 (*)    All other components within normal limits  BRAIN NATRIURETIC PEPTIDE - Abnormal; Notable for the following components:   B Natriuretic Peptide 925.7 (*)    All other components within normal limits  RESP PANEL BY RT-PCR (RSV, FLU A&B, COVID)  RVPGX2  CULTURE, BLOOD (ROUTINE X 2)  CULTURE, BLOOD (ROUTINE X 2)  PROCALCITONIN  POC URINE PREG, ED  TROPONIN I (HIGH SENSITIVITY)  TROPONIN I (HIGH SENSITIVITY)     EKG    RADIOLOGY Chest x-ray interpreted by me, shows bilateral basilar infiltrates, radiology report reviewed   PROCEDURES:  Procedures   MEDICATIONS ORDERED IN ED: Medications  vancomycin (VANCOREADY) IVPB 1250 mg/250 mL (has no administration in time range)  lactated ringers infusion (has no administration in time range)  acetaminophen (TYLENOL) tablet 650 mg (has no administration in time range)    Or  acetaminophen (TYLENOL) suppository 650 mg (has no administration in time range)  ondansetron (ZOFRAN) tablet 4 mg (has no administration in time range)    Or  ondansetron (ZOFRAN) injection 4 mg (has no administration in time range)  heparin injection 5,000 Units (has no administration in time range)  senna-docusate (Senokot-S) tablet 1 tablet (has no administration in time range)  HYDROmorphone (DILAUDID) injection 1 mg (1 mg Intravenous Given 08/22/23 1227)  ondansetron (ZOFRAN) injection 4 mg (4 mg Intravenous Given 08/22/23 1228)  furosemide (LASIX) injection 40 mg (40 mg Intravenous Given 08/22/23 1228)   ceFEPIme (MAXIPIME) 2 g in sodium chloride 0.9 % 100 mL IVPB (2 g Intravenous New Bag/Given 08/22/23 1332)     IMPRESSION / MDM / ASSESSMENT AND PLAN / ED COURSE  I reviewed the triage vital signs and the nursing notes.  DDx: Pneumonia with outpatient treatment failure, pulmonary edema, pleural effusion, pneumothorax, viral syndrome, hyperkalemia  Patient's presentation is most consistent with acute presentation with potential threat to life or bodily function.  Patient presents with shortness of breath, cough, vomiting, x-ray with infiltrates, concerning for pneumonia.  Recently completed outpatient antibiotic course, so with her severe chronic comorbidities and outpatient treatment failure will need to hospitalize for further management.  Discussed with hospitalist for further evaluation.       FINAL CLINICAL IMPRESSION(S) / ED DIAGNOSES   Final diagnoses:  Community acquired pneumonia, unspecified laterality  Failure of outpatient treatment  ESRD on hemodialysis (HCC)     Rx / DC Orders   ED Discharge Orders     None        Note:  This document was prepared using Dragon voice recognition software and may include unintentional dictation errors.   Sharman Cheek, MD 08/22/23 1409

## 2023-08-22 NOTE — Assessment & Plan Note (Addendum)
Present on admission.  Markedly elevated leukocytosis, increased respiration rate, source is pneumonia.  Patient received IV antibiotics here in the hospital and will give 2 more days of Augmentin upon going home and 1 more day of Zithromax.

## 2023-08-22 NOTE — Assessment & Plan Note (Addendum)
Secondary to end-stage renal disease on hemodialysis Appreciate recommendation from nephrology Recheck BMP in a.m.

## 2023-08-22 NOTE — ED Notes (Signed)
Pr requested top wait on EKG because of nausea. Pt went to x-ray. X-ray was instructed to place pt in waiting room

## 2023-08-22 NOTE — ED Notes (Signed)
ED TO INPATIENT HANDOFF REPORT  ED Nurse Name and Phone #: Raynelle Fanning, RN  S Name/Age/Gender Emma Thomas 27 y.o. female Room/Bed: ED35A/ED35A  Code Status   Code Status: Full Code  Home/SNF/Other Home Patient oriented to: self, place, time, and situation Is this baseline? Yes   Triage Complete: Triage complete  Chief Complaint Severe sepsis (HCC) [A41.9, R65.20]  Triage Note First nurse note: Pt to ED via POV from home. This RN received call from secretary stating pt drove and parked in parking lot and needs assistance out of car due to feeling too weak. Pt reports seen on 9/27 and dx with PNA. Pt reports worsening SOB and CP. Pt dialysis pt and reports doesn't know when last tx was.   Patient c/o weakness, sob and cp. Reports she was diagnosed with pneumonia 08/13/23 and feels worse.   Dialysis patient. Reports she does her own treatment at home 4X per week but has not done it this week.   A&O x4 at this time in triage   Allergies Allergies  Allergen Reactions   Losartan Swelling    Angioedema of tongue    Level of Care/Admitting Diagnosis ED Disposition     ED Disposition  Admit   Condition  --   Comment  Hospital Area: [facility] [[id number]]  Level of Care: Progressive [102]  Admit to Progressive based on following criteria: MULTISYSTEM THREATS such as stable sepsis, metabolic/electrolyte imbalance with or without encephalopathy that is responding to early treatment.  Covid Evaluation: Asymptomatic - no recent exposure (last 10 days) testing not required  Diagnosis: Severe sepsis [facility]) [[id number]]  Admitting Physician: Lovenia Kim [[id number]]  Attending Physician: COX, AMY N [id number]  Certification:: I certify this patient will need inpatient services for at least 2 midnights  Expected Medical Readiness: 08/25/2023          B Medical/Surgery History Past Medical History:  Diagnosis Date   Anemia    Diabetes mellitus without  complication (HCC)    Type 1 DM   Essential hypertension    Headache    Hypertension 03/04/2013   Neurologic disorder    Both feet   Neuromuscular disorder (HCC)    Recurrent UTI    Renal disorder    Past Surgical History:  Procedure Laterality Date   abscess removal     excision of bartholin cyst   AMPUTATION Left 11/23/2022   Procedure: LEFT GREAT TOE PARTIAL RAY AMPUTATION;  Surgeon: Linus Galas, DPM;  Location: [facility];  Service: Podiatry;  Laterality: Left;   AMPUTATION Left 07/09/2023   Procedure: AMPUTATION First Ray;  Surgeon: Rosetta Posner, DPM;  Location: [facility];  Service: Orthopedics/Podiatry;  Laterality: Left;   CAPD INSERTION N/A 06/09/2022   Procedure: LAPAROSCOPIC INSERTION CONTINUOUS AMBULATORY PERITONEAL DIALYSIS  (CAPD) CATHETER, PD rep to be present;  Surgeon: Henrene Dodge, MD;  Location: [facility];  Service: General;  Laterality: N/A;   CAPD INSERTION N/A 12/15/2022   Procedure: LAPAROSCOPIC INSERTION CONTINUOUS AMBULATORY PERITONEAL DIALYSIS  (CAPD) CATHETER;  Surgeon: Henrene Dodge, MD;  Location: [facility];  Service: General;  Laterality: N/A;   CAPD REMOVAL N/A 12/15/2022   Procedure: LAPAROSCOPIC REMOVAL CONTINUOUS AMBULATORY PERITONEAL DIALYSIS  (CAPD) CATHETER, removal of old catheter;  Surgeon: Henrene Dodge, MD;  Location: [facility];  Service: General;  Laterality: N/A;   CAPD REMOVAL N/A 01/08/2023   Procedure: LAPAROSCOPIC REMOVAL CONTINUOUS AMBULATORY PERITONEAL DIALYSIS  (CAPD) CATHETER;  Surgeon: Henrene Dodge, MD;  Location: [facility];  Service: General;  Laterality: N/A;   CAPD REVISION N/A 07/16/2022   Procedure: LAPAROSCOPIC REVISION CONTINUOUS AMBULATORY PERITONEAL DIALYSIS  (CAPD) CATHETER;  Surgeon: Henrene Dodge, MD;  Location: [facility];  Service: General;  Laterality: N/A;   CAPD REVISION N/A 11/24/2022   Procedure: LAPAROSCOPIC REVISION CONTINUOUS AMBULATORY PERITONEAL DIALYSIS  (CAPD) CATHETER;  Surgeon: Henrene Dodge, MD;  Location: [facility];   Service: General;  Laterality: N/A;   DIALYSIS/PERMA CATHETER INSERTION N/A 04/24/2022   Procedure: DIALYSIS/PERMA CATHETER INSERTION;  Surgeon: Renford Dills, MD;  Location: [facility];  Service: Cardiovascular;  Laterality: N/A;   DIALYSIS/PERMA CATHETER REMOVAL N/A 06/02/2022   Procedure: DIALYSIS/PERMA CATHETER REMOVAL;  Surgeon: Renford Dills, MD;  Location: [facility];  Service: Cardiovascular;  Laterality: N/A;   DIALYSIS/PERMA CATHETER REMOVAL N/A 12/28/2022   Procedure: DIALYSIS/PERMA CATHETER REMOVAL;  Surgeon: Annice Needy, MD;  Location: [facility];  Service: Cardiovascular;  Laterality: N/A;   INSERTION OF DIALYSIS CATHETER Right    ARM   IR FLUORO GUIDE CV LINE RIGHT  11/27/2022   IR US GUIDE VASC ACCESS RIGHT  11/27/2022   IRRIGATION AND DEBRIDEMENT FOOT Left 06/19/2023   Procedure: IRRIGATION AND DEBRIDEMENT FOOT PARTIAL 1ST RAY AMPUTATION;  Surgeon: Rosetta Posner, DPM;  Location: [facility];  Service: Orthopedics/Podiatry;  Laterality: Left;   IRRIGATION AND DEBRIDEMENT FOOT Left 07/09/2023   Procedure: IRRIGATION AND DEBRIDEMENT FOOT;  Surgeon: Rosetta Posner, DPM;  Location: [facility];  Service: Orthopedics/Podiatry;  Laterality: Left;   Nexplanon  01/2011   UMBILICAL HERNIA REPAIR N/A 06/09/2022   Procedure: HERNIA REPAIR UMBILICAL ADULT;  Surgeon: Henrene Dodge, MD;  Location: [facility];  Service: General;  Laterality: N/A;     A IV Location/Drains/Wounds Patient Lines/Drains/Airways Status     Active Line/Drains/Airways     Name Placement date Placement time Site Days   Peripheral IV 08/22/23 22 G 1" Left;Posterior Hand 08/22/23  1227  Hand  less than 1   Fistula / Graft Right Upper arm Arteriovenous fistula 03/19/22  --  Upper arm  521            Intake/Output Last 24 hours No intake or output data in the 24 hours ending 08/22/23 2142  Labs/Imaging Results for orders placed or performed during the hospital encounter of  08/22/23 (from the past 48 hour(s))  Basic metabolic panel     Status: Abnormal   Collection Time: 08/22/23 11:20 AM  Result Value Ref Range   Sodium 130 (L) 135 - 145 mmol/L    Comment: ELECTROLYTES REPEATED TO VERIFY   Potassium 3.8 3.5 - 5.1 mmol/L   Chloride 93 (L) 98 - 111 mmol/L   CO2 15 (L) 22 - 32 mmol/L   Glucose, Bld 172 (H) 70 - 99 mg/dL    Comment: Glucose reference range applies only to samples taken after fasting for at least 8 hours.   BUN 71 (H) 6 - 20 mg/dL   Creatinine, Ser 02.72 (H) 0.44 - 1.00 mg/dL   Calcium 7.9 (L) 8.9 - 10.3 mg/dL   GFR, Estimated 3 (L) >60 mL/min    Comment: (NOTE) Calculated using the CKD-EPI Creatinine Equation (2021)    Anion gap 22 (H) 5 - 15    Comment: Performed at [facility], [address] [place], [place] [address]  CBC     Status: Abnormal   Collection Time: 08/22/23 11:20 AM  Result Value Ref Range   WBC 17.0 (H) 4.0 - 10.5 K/uL  RBC 2.45 (L) 3.87 - 5.11 MIL/uL   Hemoglobin 7.3 (L) 12.0 - 15.0 g/dL   HCT 40.9 (L) 81.1 - 91.4 %   MCV 85.3 80.0 - 100.0 fL   MCH 29.8 26.0 - 34.0 pg   MCHC 34.9 30.0 - 36.0 g/dL   RDW 78.2 95.6 - 21.3 %   Platelets 523 (H) 150 - 400 K/uL   nRBC 0.0 0.0 - 0.2 %    Comment: Performed at [facility], [address], [place], [place] [address]  Troponin I (High Sensitivity)     Status: None   Collection Time: 08/22/23 11:20 AM  Result Value Ref Range   Troponin I (High Sensitivity) 7 <18 ng/L    Comment: (NOTE) Elevated high sensitivity troponin I (hsTnI) values and significant  changes across serial measurements may suggest ACS but many other  chronic and acute conditions are known to elevate hsTnI results.  Refer to the "Links" section for chest pain algorithms and additional  guidance. Performed at [facility], [address] [place], [place] [address]   Brain natriuretic peptide     Status: Abnormal   Collection Time: 08/22/23 11:20 AM  Result Value  Ref Range   B Natriuretic Peptide 925.7 (H) 0.0 - 100.0 pg/mL    Comment: Performed at [facility], [address] [place], [place] [address]  Procalcitonin     Status: None   Collection Time: 08/22/23 11:20 AM  Result Value Ref Range   Procalcitonin 0.33 ng/mL    Comment:        Interpretation: PCT (Procalcitonin) <= 0.5 ng/mL: Systemic infection (sepsis) is not likely. Local bacterial infection is possible. (NOTE)       Sepsis PCT Algorithm           Lower Respiratory Tract                                      Infection PCT Algorithm    ----------------------------     ----------------------------         PCT < 0.25 ng/mL                PCT < 0.10 ng/mL          Strongly encourage             Strongly discourage   discontinuation of antibiotics    initiation of antibiotics    ----------------------------     -----------------------------       PCT 0.25 - 0.50 ng/mL            PCT 0.10 - 0.25 ng/mL               OR       >80% decrease in PCT            Discourage initiation of                                            antibiotics      Encourage discontinuation           of antibiotics    ----------------------------     -----------------------------         PCT >= 0.50 ng/mL              PCT  0.26 - 0.50 ng/mL               AND        <80% decrease in PCT             Encourage initiation of                                             antibiotics       Encourage continuation           of antibiotics    ----------------------------     -----------------------------        PCT >= 0.50 ng/mL                  PCT > 0.50 ng/mL               AND         increase in PCT                  Strongly encourage                                      initiation of antibiotics    Strongly encourage escalation           of antibiotics                                     -----------------------------                                           PCT <= 0.25 ng/mL                                                  OR                                        > 80% decrease in PCT                                      Discontinue / Do not initiate                                             antibiotics  Performed at [facility], [address] [place], [place] [address]   Resp panel by RT-PCR (RSV, Flu A&B, Covid) Anterior Nasal Swab     Status: None   Collection Time: 08/22/23 12:38 PM   Specimen: Anterior Nasal Swab  Result Value Ref Range   SARS Coronavirus 2 by RT PCR NEGATIVE NEGATIVE    Comment: (NOTE) SARS-CoV-2 target nucleic acids are NOT DETECTED.  The SARS-CoV-2 RNA is generally detectable in upper respiratory  specimens during the acute phase of infection. The lowest concentration of SARS-CoV-2 viral copies this assay can detect is 138 copies/mL. A negative result does not preclude SARS-Cov-2 infection and should not be used as the sole basis for treatment or other patient management decisions. A negative result may occur with  improper specimen collection/handling, submission of specimen other than nasopharyngeal swab, presence of viral mutation(s) within the areas targeted by this assay, and inadequate number of viral copies(<138 copies/mL). A negative result must be combined with clinical observations, patient history, and epidemiological information. The expected result is Negative.  Fact Sheet for Patients:  BloggerCourse.com  Fact Sheet for Healthcare Providers:  SeriousBroker.it  This test is no t yet approved or cleared by the [place] FDA and  has been authorized for detection and/or diagnosis of SARS-CoV-2 by FDA under an Emergency Use Authorization (EUA). This EUA will remain  in effect (meaning this test can be used) for the duration of the COVID-19 declaration under Section 564(b)(1) of the Act, 21 U.S.C.section 360bbb-3(b)(1), unless the authorization is terminated  or  revoked sooner.       Influenza A by PCR NEGATIVE NEGATIVE   Influenza B by PCR NEGATIVE NEGATIVE    Comment: (NOTE) The Xpert Xpress SARS-CoV-2/FLU/RSV plus assay is intended as an aid in the diagnosis of influenza from Nasopharyngeal swab specimens and should not be used as a sole basis for treatment. Nasal washings and aspirates are unacceptable for Xpert Xpress SARS-CoV-2/FLU/RSV testing.  Fact Sheet for Patients: BloggerCourse.com  Fact Sheet for Healthcare Providers: SeriousBroker.it  This test is not yet approved or cleared by the [place] FDA and has been authorized for detection and/or diagnosis of SARS-CoV-2 by FDA under an Emergency Use Authorization (EUA). This EUA will remain in effect (meaning this test can be used) for the duration of the COVID-19 declaration under Section 564(b)(1) of the Act, 21 U.S.C. section 360bbb-3(b)(1), unless the authorization is terminated or revoked.     Resp Syncytial Virus by PCR NEGATIVE NEGATIVE    Comment: (NOTE) Fact Sheet for Patients: BloggerCourse.com  Fact Sheet for Healthcare Providers: SeriousBroker.it  This test is not yet approved or cleared by the [place] FDA and has been authorized for detection and/or diagnosis of SARS-CoV-2 by FDA under an Emergency Use Authorization (EUA). This EUA will remain in effect (meaning this test can be used) for the duration of the COVID-19 declaration under Section 564(b)(1) of the Act, 21 U.S.C. section 360bbb-3(b)(1), unless the authorization is terminated or revoked.  Performed at [facility], [address] [place], [place] [address]   Troponin I (High Sensitivity)     Status: None   Collection Time: 08/22/23  1:33 PM  Result Value Ref Range   Troponin I (High Sensitivity) 5 <18 ng/L    Comment: (NOTE) Elevated high sensitivity troponin I (hsTnI) values  and significant  changes across serial measurements may suggest ACS but many other  chronic and acute conditions are known to elevate hsTnI results.  Refer to the "Links" section for chest pain algorithms and additional  guidance. Performed at [facility], [address] [place], [place] [address]   Lactic acid, plasma     Status: None   Collection Time: 08/22/23  2:37 PM  Result Value Ref Range   Lactic Acid, Venous 1.0 0.5 - 1.9 mmol/L    Comment: Performed at [facility], [address] [place], [place] [address]  CBG monitoring, ED     Status:  Abnormal   Collection Time: 08/22/23  5:14 PM  Result Value Ref Range   Glucose-Capillary 283 (H) 70 - 99 mg/dL    Comment: Glucose reference range applies only to samples taken after fasting for at least 8 hours.   Comment 1 Notify RN    Comment 2 Document in Chart    DG Chest 2 View  Result Date: 08/22/2023 CLINICAL DATA:  Chest pain EXAM: CHEST - 2 VIEW COMPARISON:  08/13/2023 and older FINDINGS: Underinflation. Tiny effusions with some increasing adjacent opacities. Atelectasis favored over infiltrate recommend follow-up. No pneumothorax or edema. Normal cardiopericardial silhouette. IMPRESSION: Increasing lung base opacities with small effusions. Recommend follow-up Electronically Signed   By: Karen Kays M.D.   On: 08/22/2023 11:54    Pending Labs Unresulted Labs (From admission, onward)     Start     Ordered   08/23/23 0500  Procalcitonin  Tomorrow morning,   R       References:    Procalcitonin Lower Respiratory Tract Infection AND Sepsis Procalcitonin Algorithm   08/22/23 1407   08/23/23 0500  Basic metabolic panel  Tomorrow morning,   R        08/22/23 1407   08/23/23 0500  CBC  Tomorrow morning,   R        08/22/23 1407   08/23/23 0500  Hepatic function panel  Tomorrow morning,   R        08/22/23 1624   08/22/23 1712  Hepatitis B surface antigen  (New Admission Hemo Labs (Hepatitis B))  Once,   R         08/22/23 1713   08/22/23 1712  Hepatitis B surface antibody,quantitative  (New Admission [facility] (Hepatitis B))  Once,   R        08/22/23 1713   08/22/23 1641  Lactic acid, plasma  (Lactic Acid)  Once,   R        08/22/23 1640   08/22/23 1408  Culture, blood (Routine X 2) w Reflex to ID Panel  BLOOD CULTURE X 2,   STAT (with TIMED occurrences)      08/22/23 1407   Signed and Held  Renal function panel  Once,   R        Signed and Held   Signed and Held  CBC  Once,   R        Signed and Held            Vitals/Pain Today's Vitals   08/22/23 2045 08/22/23 2100 08/22/23 2115 08/22/23 2130  BP: (!) 166/99 (!) 152/108 (!) 143/90 (!) 145/92  Pulse: (!) 103 (!) 103 100 98  Resp:      Temp:      TempSrc:      SpO2: 100% 97% 98% 98%  Weight:      Height:      PainSc:        Isolation Precautions No active isolations  Medications Medications  ondansetron (ZOFRAN) tablet 4 mg (has no administration in time range)    Or  ondansetron (ZOFRAN) injection 4 mg (has no administration in time range)  heparin injection 5,000 Units (has no administration in time range)  senna-docusate (Senokot-S) tablet 1 tablet (has no administration in time range)  insulin aspart (novoLOG) injection 0-6 Units (3 Units Subcutaneous Given 08/22/23 1749)  insulin aspart (novoLOG) injection 0-5 Units (has no administration in time range)  promethazine (PHENERGAN) 12.5 mg in sodium chloride 0.9 % 50 mL IVPB (  0 mg Intravenous Stopped 08/22/23 1528)  vancomycin (VANCOREADY) IVPB 750 mg/150 mL (has no administration in time range)  ceFEPIme (MAXIPIME) 1 g in sodium chloride 0.9 % 100 mL IVPB (has no administration in time range)  guaiFENesin (ROBITUSSIN) 100 MG/5ML liquid 5 mL (has no administration in time range)  chlorpheniramine-HYDROcodone (TUSSIONEX) 10-8 MG/5ML suspension 5 mL (has no administration in time range)  lidocaine (LIDODERM) 5 % 1 patch (has no administration in time range)   acetaminophen (TYLENOL) tablet 1,000 mg (has no administration in time range)    Or  acetaminophen (TYLENOL) suppository 650 mg (has no administration in time range)  cyclobenzaprine (FLEXERIL) tablet 5 mg (has no administration in time range)  SUMAtriptan (IMITREX) tablet 50 mg (has no administration in time range)  atorvastatin (LIPITOR) tablet 80 mg (has no administration in time range)  bumetanide (BUMEX) tablet 1 mg (has no administration in time range)  multivitamin (RENA-VIT) tablet 1 tablet (has no administration in time range)  montelukast (SINGULAIR) tablet 10 mg (has no administration in time range)  Vitamin D (Ergocalciferol) (DRISDOL) 1.25 MG (50000 UNIT) capsule 50,000 Units (has no administration in time range)  hydrALAZINE (APRESOLINE) injection 5 mg (has no administration in time range)  sevelamer carbonate (RENVELA) tablet 1,600 mg (has no administration in time range)    And  sevelamer carbonate (RENVELA) tablet 800 mg (has no administration in time range)  HYDROmorphone (DILAUDID) injection 1 mg (1 mg Intravenous Given 08/22/23 1227)  ondansetron (ZOFRAN) injection 4 mg (4 mg Intravenous Given 08/22/23 1228)  furosemide (LASIX) injection 40 mg (40 mg Intravenous Given 08/22/23 1228)  ceFEPIme (MAXIPIME) 2 g in sodium chloride 0.9 % 100 mL IVPB (0 g Intravenous Stopped 08/22/23 1425)  vancomycin (VANCOREADY) IVPB 1250 mg/250 mL (0 mg Intravenous Stopped 08/22/23 1705)  acetaminophen (OFIRMEV) IV 1,000 mg (0 mg Intravenous Stopped 08/22/23 1730)    Mobility walks     Focused Assessments Cardiac Assessment Handoff:  Cardiac Rhythm: Sinus tachycardia Lab Results  Component Value Date   TROPONINI 0.03 (HH) 08/16/2017   No results found for: "DDIMER" Does the Patient currently have chest pain? No    R Recommendations: See Admitting Provider Note  Report given to:   Additional Notes: PT A&O x4, is getting dialysis now, Missed her dialysis last week

## 2023-08-22 NOTE — H&P (Signed)
History and Physical   Emma Thomas WJX:[id number] DOB: 03/05/1996 DOA: 08/22/2023  PCP: [name] Specialists: Dr. Myrtis Ser, [facility] Patient coming from: Home via POV  I have personally briefly reviewed patient's old medical records in [facility] EMR.  Chief Concern: Weakness, shortness of breath, chest pain  HPI: [name] is a 27 year old female with history of end-stage renal disease on hemodialysis at home, hypertension, hyperlipidemia, migraine headaches, history of osteomyelitis of the left foot, insulin-dependent diabetes mellitus type 1, who presents to emergency department for chief concerns of nose, shortness of breath, chest pain.  Vitals in the ED showed temperature of 98.2, respiration rate of 20, heart rate 90, blood pressure 133/93, SpO2 100% on room air.  Serum sodium is 130, potassium 3.8, chloride 93, bicarb 15, BUN of 71, serum creatinine of 13.83, EGFR 3, nonfasting blood glucose 172, WBC 17, hemoglobin 7.3, platelets of 523.  BNP was elevated 925.7.  High sensitive troponin is 7.  Procalcitonin is elevated at 0.33.    COVID/influenza A/influenza B/RSV PCR were negative.  Chest x-ray 2 view: Was read as increasing lung base opacity with small effusions.  ED treatment: Vancomycin, cefepime 2 g IV, furosemide 40 mg IV, Dilaudid 1 mg IV, ondansetron 4 mg IV. ---------------------------------------- At bedside, patient is awake alert and oriented x 3.  Patient reports she has been having cough, shortness of breath and chest pain since 08/13/2023.  She reports that she has not been feeling well for about 3 to 4 days before that. She denies known sick contacts.  She reports the cough is dry, nonproductive.  She endorses nausea and vomiting today.  She reports she vomited 3-4 times and has vomited up white spit.  She endorses chest pain that is persistent since Friday, 9/27. It is worse with coughing.  She denies trauma to her  person.  Social history: Patient lives at home with her husband.  She denies tobacco, EtOH, recreational drug use.  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  ROS: Constitutional: no weight change, no fever ENT/Mouth: no sore throat, no rhinorrhea Eyes: no eye pain, no vision changes Cardiovascular: + chest pain, no dyspnea,  no edema, no palpitations Respiratory: + cough, + sputum, no wheezing Gastrointestinal: no nausea, no vomiting, no diarrhea, no constipation Genitourinary: no urinary incontinence, no dysuria, no hematuria Musculoskeletal: no arthralgias, no myalgias Skin: no skin lesions, no pruritus, Neuro: + weakness, no loss of consciousness, no syncope Psych: no anxiety, no depression, + decrease appetite Heme/Lymph: no bruising, no bleeding  ED Course: Discussed with emergency medicine provider, patient requiring hospitalization for chief concerns of pneumonia, failed outpatient therapy.  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 Continue with cefepime, vancomycin per pharmacy Azithromycin has not been ordered as patient states she has completed 5-day Dosepak of  azithromycin as prescribed on 08/13/2023 No IV fluid ordered on admission as patient is maintaining appropriate MAP and has end-stage renal disease on hemodialysis has not received hemodialysis treatment in 1 week Will admit to PCU, inpatient  CAP (community acquired pneumonia) Patient is status post completion of Z-Pak, azithromycin as prescribed on 08/13/2023 Incentive spirometry, flutter valve every 2 hours while awake Vancomycin and cefepime per pharmacy PCU, inpatient  ESRD on dialysis [facility]) Nephrology has been consulted for resumption of hemodialysis while inpatient  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 twice daily as appropriate Hydralazine 5 mg IV every 6 hours as needed for SBP greater 160, 4 days ordered  High anion gap metabolic acidosis Secondary to end-stage renal disease on hemodialysis Appreciate recommendation from nephrology Recheck BMP in a.m.  Dyslipidemia Atorvastatin 80 mg daily  Muscular chest pain Secondary to persistent cough for approximately 2 weeks Lidocaine patch, daily as needed for pain, 5 days ordered Acetaminophen 1000 mg p.o./650 rectal every 6 hours as needed for mild pain, fever, 2 days ordered AM team to decrease oral acetaminophen dosing pending clinical course Cyclobenzaprine 5 mg 3 times daily as needed for muscle spasms, muscle aches, 2 days ordered  Chart reviewed.   DVT prophylaxis: Heparin subcutaneous every 8 hours starting on 08/23/2023 Code Status: Full code Diet: Renal diet Family Communication: A phone call was offered, patient  declined stating that her husband already knows she is in the hospital. Disposition Plan: Medical course Consults called: Pharmacy, nephrology service Admission status: PCU, inpatient  Past Medical History:  Diagnosis Date   Anemia    Diabetes mellitus without complication (HCC)    Type 1 DM   Essential hypertension    Headache    Hypertension 03/04/2013   Neurologic disorder    Both feet   Neuromuscular disorder (HCC)    Recurrent UTI    Renal disorder    Past Surgical History:  Procedure Laterality Date   abscess removal     excision of bartholin cyst   AMPUTATION Left 11/23/2022   Procedure: LEFT GREAT TOE PARTIAL RAY AMPUTATION;  Surgeon: Linus Galas, DPM;  Location: [facility];  Service: Podiatry;  Laterality: Left;   AMPUTATION Left 07/09/2023   Procedure: AMPUTATION First Ray;  Surgeon: Rosetta Posner, DPM;  Location: [facility];  Service: Orthopedics/Podiatry;  Laterality: Left;   CAPD INSERTION N/A 06/09/2022   Procedure: LAPAROSCOPIC INSERTION CONTINUOUS AMBULATORY PERITONEAL DIALYSIS  (CAPD) CATHETER, PD rep to be present;  Surgeon: Henrene Dodge, MD;  Location: [facility];  Service: General;  Laterality: N/A;   CAPD INSERTION N/A 12/15/2022   Procedure: LAPAROSCOPIC INSERTION CONTINUOUS AMBULATORY PERITONEAL DIALYSIS  (CAPD) CATHETER;  Surgeon: Henrene Dodge, MD;  Location: [facility];  Service: General;  Laterality: N/A;   CAPD REMOVAL N/A 12/15/2022   Procedure: LAPAROSCOPIC REMOVAL CONTINUOUS AMBULATORY PERITONEAL DIALYSIS  (CAPD) CATHETER, removal of old catheter;  Surgeon: Henrene Dodge, MD;  Location: [facility];  Service: General;  Laterality: N/A;   CAPD REMOVAL N/A 01/08/2023   Procedure: LAPAROSCOPIC REMOVAL CONTINUOUS AMBULATORY PERITONEAL  DIALYSIS  (CAPD) CATHETER;  Surgeon: Henrene Dodge, MD;  Location: [facility];  Service: General;  Laterality: N/A;   CAPD REVISION N/A 07/16/2022   Procedure: LAPAROSCOPIC REVISION CONTINUOUS AMBULATORY PERITONEAL DIALYSIS  (CAPD) CATHETER;   Surgeon: Henrene Dodge, MD;  Location: [facility];  Service: General;  Laterality: N/A;   CAPD REVISION N/A 11/24/2022   Procedure: LAPAROSCOPIC REVISION CONTINUOUS AMBULATORY PERITONEAL DIALYSIS  (CAPD) CATHETER;  Surgeon: Henrene Dodge, MD;  Location: [facility];  Service: General;  Laterality: N/A;   DIALYSIS/PERMA CATHETER INSERTION N/A 04/24/2022   Procedure: DIALYSIS/PERMA CATHETER INSERTION;  Surgeon: Renford Dills, MD;  Location: [facility];  Service: Cardiovascular;  Laterality: N/A;   DIALYSIS/PERMA CATHETER REMOVAL N/A 06/02/2022   Procedure: DIALYSIS/PERMA CATHETER REMOVAL;  Surgeon: Renford Dills, MD;  Location: [facility];  Service: Cardiovascular;  Laterality: N/A;   DIALYSIS/PERMA CATHETER REMOVAL N/A 12/28/2022   Procedure: DIALYSIS/PERMA CATHETER REMOVAL;  Surgeon: Annice Needy, MD;  Location: [facility];  Service: Cardiovascular;  Laterality: N/A;   INSERTION OF DIALYSIS CATHETER Right    ARM   IR FLUORO GUIDE CV LINE RIGHT  11/27/2022   IR US GUIDE VASC ACCESS RIGHT  11/27/2022   IRRIGATION AND DEBRIDEMENT FOOT Left 06/19/2023   Procedure: IRRIGATION AND DEBRIDEMENT FOOT PARTIAL 1ST RAY AMPUTATION;  Surgeon: Rosetta Posner, DPM;  Location: [facility];  Service: Orthopedics/Podiatry;  Laterality: Left;   IRRIGATION AND DEBRIDEMENT FOOT Left 07/09/2023   Procedure: IRRIGATION AND DEBRIDEMENT FOOT;  Surgeon: Rosetta Posner, DPM;  Location: [facility];  Service: Orthopedics/Podiatry;  Laterality: Left;   Nexplanon  01/2011   UMBILICAL HERNIA REPAIR N/A 06/09/2022   Procedure: HERNIA REPAIR UMBILICAL ADULT;  Surgeon: Henrene Dodge, MD;  Location: [facility];  Service: General;  Laterality: N/A;   Social History:  reports that she has never smoked. She has been exposed to tobacco smoke. She has never used smokeless tobacco. She reports that she does not currently use alcohol after a past usage of about 2.0 standard drinks of alcohol per week. She reports that she  does not use drugs.  Allergies  Allergen Reactions   Losartan Swelling    Angioedema of tongue   Family History  Problem Relation Age of Onset   Breast cancer Mother 10   Lung cancer Maternal Grandmother    Diabetes type II Paternal Grandmother    Family history: Family history reviewed and not pertinent.  Prior to Admission medications   Medication Sig Start Date End Date Taking? Authorizing Provider  acetaminophen (TYLENOL) 500 MG tablet Take 2 tablets (1,000 mg total) by mouth every 6 (six) hours as needed for mild pain. 01/08/23   Henrene Dodge, MD  amLODipine (NORVASC) 10 MG tablet Take 1 tablet (10 mg total) by mouth daily. 04/28/22   Lurene Shadow, MD  atorvastatin (LIPITOR) 80 MG tablet Take 80 mg by mouth daily.    [provider]  bumetanide (BUMEX) 1 MG tablet Take 1 mg by mouth 2 (two) times daily.    [provider]  HUMALOG 100 UNIT/ML injection Inject 0-1 mLs (0-100 Units total) into the skin daily. Uses with Insulin Pump 04/09/23   Kathlen Mody, MD  metoprolol tartrate (LOPRESSOR) 25 MG tablet Take 1 tablet (25 mg total) by mouth 2 (two) times daily. 07/13/23 08/12/23  Sharman Cheek, MD  montelukast (SINGULAIR) 10 MG tablet Take 10 mg by mouth at bedtime.    [provider]  multivitamin (RENA-VIT) TABS tablet Take 1  tablet by mouth at bedtime. 06/21/23 09/19/23  Gillis Santa, MD  norethindrone-ethinyl estradiol (LOESTRIN) 1-20 MG-MCG tablet Take 1 tablet by mouth daily.    [provider]  ondansetron (ZOFRAN) 8 MG tablet Take 8 mg by mouth daily.    [provider]  oxyCODONE-acetaminophen (PERCOCET/ROXICET) 5-325 MG tablet Take 1 tablet by mouth every 6 (six) hours as needed. 07/13/23   Arnetha Courser, MD  polyethylene glycol (MIRALAX / GLYCOLAX) 17 g packet Take 17 g by mouth daily. 07/14/23   Arnetha Courser, MD  sevelamer carbonate (RENVELA) 800 MG tablet Take 800-1,600 mg by mouth See admin instructions. Take 2 tablets  (1600mg ) by mouth three times daily with meals and take 1 tablet (800mg ) by mouth daily with snacks    [provider]  SUMAtriptan (IMITREX) 50 MG tablet Take 50 mg by mouth daily as needed for migraine.    [provider]  Vitamin D, Ergocalciferol, (DRISDOL) 1.25 MG (50000 UNIT) CAPS capsule Take 1 capsule (50,000 Units total) by mouth every 7 (seven) days. 06/27/23 09/25/23  Gillis Santa, MD   Physical Exam: Vitals:   08/22/23 1116 08/22/23 1117 08/22/23 1509  BP: (!) 133/93    Pulse: 98    Resp: 20    Temp: 98.2 F (36.8 C)  99.3 F (37.4 C)  TempSrc: Oral  Oral  SpO2: 100%    Weight:  72.6 kg   Height:  5\' 6"  (1.676 m)    Constitutional: appears age-appropriate, frail, acutely ill, weak appearing Eyes: PERRL, lids and conjunctivae normal ENMT: Mucous membranes are moist. Posterior pharynx clear of any exudate or lesions. Age-appropriate dentition. Hearing appropriate Neck: normal, supple, no masses, no thyromegaly Respiratory: clear to auscultation bilaterally, no wheezing, no crackles. Normal respiratory effort. No accessory muscle use.  Cardiovascular: Regular rate and rhythm, no murmurs / rubs / gallops.  Bilateral trace lower extremity edema. 2+ pedal pulses. No carotid bruits.  Abdomen: no tenderness, no masses palpated, no hepatosplenomegaly. Bowel sounds positive.  Musculoskeletal: no clubbing / cyanosis. No joint deformity upper and lower extremities. Good ROM, no contractures, no atrophy. Normal muscle tone.  Skin: no rashes, lesions, ulcers. No induration Neurologic: Sensation intact. Strength 5/5 in all 4.  Multiple skin tattoos, they appear old and well-healed. Psychiatric: Normal judgment and insight. Alert and oriented x 3.  Depressed mood.   EKG: Ordered pending completion  Chest x-ray on Admission: I personally reviewed and I agree with radiologist reading as below.  DG Chest 2 View  Result Date: 08/22/2023 CLINICAL DATA:  Chest pain EXAM:  CHEST - 2 VIEW COMPARISON:  08/13/2023 and older FINDINGS: Underinflation. Tiny effusions with some increasing adjacent opacities. Atelectasis favored over infiltrate recommend follow-up. No pneumothorax or edema. Normal cardiopericardial silhouette. IMPRESSION: Increasing lung base opacities with small effusions. Recommend follow-up Electronically Signed   By: Karen Kays M.D.   On: 08/22/2023 11:54    Labs on Admission: I have personally reviewed following labs  CBC: Recent Labs  Lab 08/22/23 1120  WBC 17.0*  HGB 7.3*  HCT 20.9*  MCV 85.3  PLT 523*   Basic Metabolic Panel: Recent Labs  Lab 08/22/23 1120  NA 130*  K 3.8  CL 93*  CO2 15*  GLUCOSE 172*  BUN 71*  CREATININE 13.83*  CALCIUM 7.9*   GFR: Estimated Creatinine Clearance: 6.2 mL/min (A) (by C-G formula based on SCr of 13.83 mg/dL (H)).  Urine analysis:    Component Value Date/Time   COLORURINE YELLOW (A) 08/13/2023 1018  APPEARANCEUR CLOUDY (A) 08/13/2023 1018   LABSPEC 1.017 08/13/2023 1018   PHURINE 6.0 08/13/2023 1018   GLUCOSEU 50 (A) 08/13/2023 1018   HGBUR SMALL (A) 08/13/2023 1018   BILIRUBINUR NEGATIVE 08/13/2023 1018   KETONESUR 5 (A) 08/13/2023 1018   PROTEINUR >=300 (A) 08/13/2023 1018   NITRITE NEGATIVE 08/13/2023 1018   LEUKOCYTESUR TRACE (A) 08/13/2023 1018   CRITICAL CARE Performed by: Dr. Sedalia Muta  Total critical care time: 32 minutes  Critical care time was exclusive of separately billable procedures and treating other patients.  Critical care was necessary to treat or prevent imminent or life-threatening deterioration.  Critical care was time spent personally by me on the following activities: development of treatment plan with patient and/or surrogate as well as nursing, discussions with consultants, evaluation of patient's response to treatment, examination of patient, obtaining history from patient or surrogate, ordering and performing treatments and interventions, ordering and review  of laboratory studies, ordering and review of radiographic studies, pulse oximetry and re-evaluation of patient's condition.  This document was prepared using Dragon Voice Recognition software and may include unintentional dictation errors.  Dr. Sedalia Muta [facility]  If 7PM-7AM, please contact overnight-coverage provider If 7AM-7PM, please contact day attending provider [email or web address]  08/22/2023, 4:29 PM

## 2023-08-22 NOTE — ED Triage Notes (Signed)
First nurse note: Pt to ED via POV from home. This RN received call from secretary stating pt drove and parked in parking lot and needs assistance out of car due to feeling too weak. Pt reports seen on 9/27 and dx with PNA. Pt reports worsening SOB and CP. Pt dialysis pt and reports doesn't know when last tx was.

## 2023-08-22 NOTE — Assessment & Plan Note (Signed)
Nephrology has been consulted for resumption of hemodialysis while inpatient

## 2023-08-22 NOTE — Progress Notes (Signed)
Pharmacy Antibiotic Note  Emma Thomas is a 27 y.o. female w/ PMH of HTN, ESRD on dialysis admitted on 08/22/2023 with sepsis.  Pharmacy has been consulted for vancomycin and cefepime dosing.  Plan:  1) start cefepime 1 gram IV every 24 hours to be timed after HD  2) start vancomycin 1250 mg IV x 1 (administered in the ED) then 750 mg w/ HD sessions ---target vancomycin level 15 - 25 mcg/mL (level prior to 3rd maintenance dose)   Height: 5\' 6"  (167.6 cm) Weight: 72.6 kg (160 lb) IBW/kg (Calculated) : 59.3  Temp (24hrs), Avg:98.2 F (36.8 C), Min:98.2 F (36.8 C), Max:98.2 F (36.8 C)  Recent Labs  Lab 08/22/23 1120  WBC 17.0*  CREATININE 13.83*    Estimated Creatinine Clearance: 6.2 mL/min (A) (by C-G formula based on SCr of 13.83 mg/dL (H)).    Allergies  Allergen Reactions   Losartan Swelling    Angioedema of tongue    Antimicrobials this admission: 10/06 vancomycin >>  10/06 cefepime >>   Microbiology results: 10/06 BCx: pending  Thank you for allowing pharmacy to be a part of this patient's care.  Lowella Bandy 08/22/2023 2:19 PM

## 2023-08-22 NOTE — ED Notes (Signed)
Pt taken to room 1 by this RN. Pt placed on cardiac monitor. Primary RN made aware.

## 2023-08-22 NOTE — Progress Notes (Signed)
Received patient in bed   Alert and oriented x4 Informed consent signed and in chart 08/22/23  TX duration:3 hours  Patient tolerated well.  Alert, without acute distress.  Hand-off given to patient's nurse.   Access used: AVF Access issues: none  Total UF removed: 1500 Medication(s) given: none Post HD VS: see table below Post HD weight: 71.9kg    08/22/23 2245  Vitals  BP (!) 146/93  MAP (mmHg) 108  BP Location Left Arm  BP Method Automatic  Patient Position (if appropriate) Lying  Pulse Rate (!) 109  Pulse Rate Source Monitor  ECG Heart Rate (!) 109  Oxygen Therapy  SpO2 97 %  O2 Device Room Air  Patient Activity (if Appropriate) In bed  Pulse Oximetry Type Continuous  During Treatment Monitoring  Blood Flow Rate (mL/min) 0 mL/min  Arterial Pressure (mmHg) -1.82 mmHg  Venous Pressure (mmHg) -0.61 mmHg  TMP (mmHg) -49.09 mmHg  Ultrafiltration Rate (mL/min) 828 mL/min  Dialysate Flow Rate (mL/min) 0 ml/min  Duration of HD Treatment -hour(s) 3 hour(s)  Cumulative Fluid Removed (mL) per Treatment  1500.17  HD Safety Checks Performed Yes  Intra-Hemodialysis Comments Tolerated well  Post Treatment  Dialyzer Clearance Clear  Hemodialysis Intake (mL) 0 mL  Liters Processed 71.9  Fluid Removed (mL) 1500 mL  Tolerated HD Treatment Yes  Post-Hemodialysis Comments goal met  AVG/AVF Arterial Site Held (minutes) 5 minutes  AVG/AVF Venous Site Held (minutes) 5 minutes  Fistula / Graft Right Upper arm Arteriovenous fistula  Placement Date: 03/19/22   Placed prior to admission: Yes  Orientation: Right  Access Location: Upper arm  Access Type: Arteriovenous fistula  Site Condition No complications  Fistula / Graft Assessment Present;Thrill;Bruit;Aneurysm present  Status Deaccessed;Flushed;Patent      [facility]

## 2023-08-22 NOTE — ED Notes (Signed)
Advised nurse that patient has ready bed 

## 2023-08-22 NOTE — Assessment & Plan Note (Signed)
Insulin-dependent diabetes mellitus type 1 Insulin SSI with at bedtime coverage, end-stage renal dialysis dosing ordered Goal inpatient blood glucose levels 140-180

## 2023-08-22 NOTE — Assessment & Plan Note (Signed)
Atorvastatin 80 mg daily

## 2023-08-22 NOTE — ED Triage Notes (Signed)
Patient c/o weakness, sob and cp. Reports she was diagnosed with pneumonia 08/13/23 and feels worse.   Dialysis patient. Reports she does her own treatment at home 4X per week but has not done it this week.   A&O x4 at this time in triage

## 2023-08-22 NOTE — ED Notes (Signed)
Called for lab at this time to collect cultures.

## 2023-08-22 NOTE — Hospital Course (Addendum)
[name] is a 27 year old female with history of end-stage renal disease on hemodialysis at home, hypertension, hyperlipidemia, migraine headaches, history of osteomyelitis of the left foot, insulin-dependent diabetes mellitus type 1, who presents to emergency department for chief concerns of nose, shortness of breath, chest pain.  Vitals in the ED showed temperature of 98.2, respiration rate of 20, heart rate 90, blood pressure 133/93, SpO2 100% on room air.  Serum sodium is 130, potassium 3.8, chloride 93, bicarb 15, BUN of 71, serum creatinine of 13.83, EGFR 3, nonfasting blood glucose 172, WBC 17, hemoglobin 7.3, platelets of 523.  BNP was elevated 925.7.  High sensitive troponin is 7.  Procalcitonin is elevated at 0.33.    COVID/influenza A/influenza B/RSV PCR were negative.  Chest x-ray 2 view: Was read as increasing lung base opacity with small effusions.  ED treatment: Vancomycin, cefepime 2 g IV, furosemide 40 mg IV, Dilaudid 1 mg IV, ondansetron 4 mg IV.

## 2023-08-22 NOTE — Assessment & Plan Note (Signed)
Will discharge on 2 more days of Augmentin and 1 more day of Zithromax.

## 2023-08-22 NOTE — Assessment & Plan Note (Signed)
Secondary to persistent cough for approximately 2 weeks Lidocaine patch, daily as needed for pain, 5 days ordered Acetaminophen 1000 mg p.o./650 rectal every 6 hours as needed for mild pain, fever, 2 days ordered AM team to decrease oral acetaminophen dosing pending clinical course Cyclobenzaprine 5 mg 3 times daily as needed for muscle spasms, muscle aches, 2 days ordered

## 2023-08-22 NOTE — Progress Notes (Signed)
PHARMACY -  BRIEF ANTIBIOTIC NOTE   Pharmacy has received consult(s) for vancomycin and cefepime from an ED provider.  The patient's profile has been reviewed for ht/wt/allergies/indication/available labs.    One time order(s) placed for   1) cefepime 2 grams IV x 1  2) vancomycin 1250 mg IV x 1  Further antibiotics/pharmacy consults should be ordered by admitting physician if indicated.                       Thank you, Lowella Bandy 08/22/2023  1:12 PM

## 2023-08-22 NOTE — Assessment & Plan Note (Signed)
Continue Norvasc and metoprolol.

## 2023-08-23 ENCOUNTER — Encounter: Payer: Self-pay | Admitting: Internal Medicine

## 2023-08-23 DIAGNOSIS — A419 Sepsis, unspecified organism: Secondary | ICD-10-CM | POA: Diagnosis not present

## 2023-08-23 DIAGNOSIS — R6520 Severe sepsis without septic shock: Secondary | ICD-10-CM | POA: Diagnosis not present

## 2023-08-23 LAB — HEPATIC FUNCTION PANEL
ALT: 22 U/L (ref 0–44)
AST: 32 U/L (ref 15–41)
Albumin: 2.8 g/dL — ABNORMAL LOW (ref 3.5–5.0)
Alkaline Phosphatase: 89 U/L (ref 38–126)
Bilirubin, Direct: 0.1 mg/dL (ref 0.0–0.2)
Indirect Bilirubin: 2.6 mg/dL — ABNORMAL HIGH (ref 0.3–0.9)
Total Bilirubin: 2.7 mg/dL — ABNORMAL HIGH (ref 0.3–1.2)
Total Protein: 7.4 g/dL (ref 6.5–8.1)

## 2023-08-23 LAB — CBC
HCT: 20.8 % — ABNORMAL LOW (ref 36.0–46.0)
Hemoglobin: 7 g/dL — ABNORMAL LOW (ref 12.0–15.0)
MCH: 30.2 pg (ref 26.0–34.0)
MCHC: 33.7 g/dL (ref 30.0–36.0)
MCV: 89.7 fL (ref 80.0–100.0)
Platelets: 417 10*3/uL — ABNORMAL HIGH (ref 150–400)
RBC: 2.32 MIL/uL — ABNORMAL LOW (ref 3.87–5.11)
RDW: 14.1 % (ref 11.5–15.5)
WBC: 18.7 10*3/uL — ABNORMAL HIGH (ref 4.0–10.5)
nRBC: 0 % (ref 0.0–0.2)

## 2023-08-23 LAB — BASIC METABOLIC PANEL
Anion gap: 32 — ABNORMAL HIGH (ref 5–15)
BUN: 39 mg/dL — ABNORMAL HIGH (ref 6–20)
CO2: 8 mmol/L — ABNORMAL LOW (ref 22–32)
Calcium: 7.9 mg/dL — ABNORMAL LOW (ref 8.9–10.3)
Chloride: 92 mmol/L — ABNORMAL LOW (ref 98–111)
Creatinine, Ser: 7.48 mg/dL — ABNORMAL HIGH (ref 0.44–1.00)
GFR, Estimated: 7 mL/min — ABNORMAL LOW (ref >60)
Glucose, Bld: 353 mg/dL — ABNORMAL HIGH (ref 70–99)
Potassium: 4.3 mmol/L (ref 3.5–5.1)
Sodium: 132 mmol/L — ABNORMAL LOW (ref 135–145)

## 2023-08-23 LAB — GLUCOSE, CAPILLARY
Glucose-Capillary: 213 mg/dL — ABNORMAL HIGH (ref 70–99)
Glucose-Capillary: 234 mg/dL — ABNORMAL HIGH (ref 70–99)
Glucose-Capillary: 243 mg/dL — ABNORMAL HIGH (ref 70–99)
Glucose-Capillary: 271 mg/dL — ABNORMAL HIGH (ref 70–99)
Glucose-Capillary: 285 mg/dL — ABNORMAL HIGH (ref 70–99)
Glucose-Capillary: 331 mg/dL — ABNORMAL HIGH (ref 70–99)
Glucose-Capillary: 336 mg/dL — ABNORMAL HIGH (ref 70–99)

## 2023-08-23 LAB — HEPATITIS B SURFACE ANTIGEN: Hepatitis B Surface Ag: NONREACTIVE

## 2023-08-23 LAB — LACTIC ACID, PLASMA: Lactic Acid, Venous: 1.8 mmol/L (ref 0.5–1.9)

## 2023-08-23 LAB — MRSA NEXT GEN BY PCR, NASAL: MRSA by PCR Next Gen: NOT DETECTED

## 2023-08-23 LAB — PROCALCITONIN: Procalcitonin: 0.59 ng/mL

## 2023-08-23 MED ORDER — NORETHINDRONE ACET-ETHINYL EST 1-20 MG-MCG PO TABS: 1.0000 | ORAL_TABLET | Freq: Every day | ORAL | Status: DC

## 2023-08-23 MED ORDER — SODIUM CHLORIDE 0.9 % IV SOLN
2.0000 g | INTRAVENOUS | Status: DC
  Administered 2023-08-23: 2 g via INTRAVENOUS
  Filled 2023-08-23 (×2): qty 20

## 2023-08-23 MED ORDER — METOCLOPRAMIDE HCL 5 MG/ML IJ SOLN
10.0000 mg | Freq: Four times a day (QID) | INTRAMUSCULAR | Status: DC | PRN
  Administered 2023-08-23 – 2023-08-25 (×5): 10 mg via INTRAVENOUS
  Filled 2023-08-23 (×5): qty 2

## 2023-08-23 MED ORDER — INSULIN ASPART 100 UNIT/ML IJ SOLN
0.0000 [IU] | INTRAMUSCULAR | Status: DC
  Administered 2023-08-23 (×2): 3 [IU] via SUBCUTANEOUS
  Administered 2023-08-23: 7 [IU] via SUBCUTANEOUS
  Administered 2023-08-23 – 2023-08-24 (×3): 3 [IU] via SUBCUTANEOUS
  Administered 2023-08-24: 5 [IU] via SUBCUTANEOUS
  Filled 2023-08-23 (×7): qty 1

## 2023-08-23 MED ORDER — INSULIN GLARGINE-YFGN 100 UNIT/ML ~~LOC~~ SOLN
10.0000 [IU] | Freq: Every day | SUBCUTANEOUS | Status: DC
  Administered 2023-08-23: 10 [IU] via SUBCUTANEOUS
  Filled 2023-08-23: qty 0.1

## 2023-08-23 MED ORDER — INSULIN ASPART 100 UNIT/ML IJ SOLN
4.0000 [IU] | Freq: Once | INTRAMUSCULAR | Status: AC
  Administered 2023-08-23: 4 [IU] via SUBCUTANEOUS
  Filled 2023-08-23: qty 1

## 2023-08-23 MED ORDER — METOPROLOL TARTRATE 25 MG PO TABS
25.0000 mg | ORAL_TABLET | Freq: Two times a day (BID) | ORAL | Status: DC
  Administered 2023-08-23 – 2023-08-26 (×5): 25 mg via ORAL
  Filled 2023-08-23 (×7): qty 1

## 2023-08-23 MED ORDER — BENZONATATE 100 MG PO CAPS: 200.0000 mg | ORAL_CAPSULE | Freq: Three times a day (TID) | ORAL | Status: DC | PRN

## 2023-08-23 MED ORDER — SODIUM CHLORIDE 0.9 % IV SOLN
12.5000 mg | Freq: Four times a day (QID) | INTRAVENOUS | Status: DC | PRN
  Administered 2023-08-24 (×2): 12.5 mg via INTRAVENOUS
  Filled 2023-08-23: qty 12.5
  Filled 2023-08-23: qty 0.5

## 2023-08-23 MED ORDER — AMLODIPINE BESYLATE 10 MG PO TABS
10.0000 mg | ORAL_TABLET | Freq: Every day | ORAL | Status: DC
  Administered 2023-08-23 – 2023-08-26 (×3): 10 mg via ORAL
  Filled 2023-08-23 (×4): qty 1

## 2023-08-23 NOTE — Progress Notes (Signed)
Continue complaint of nausea and vomiting, given zofran at 22:00 and phenergan at 23:41. Informed NP oncall

## 2023-08-23 NOTE — Progress Notes (Signed)
PROGRESS NOTE    Emma Thomas   ZOX:[id number] DOB: 1996-06-12  DOA: 08/22/2023 Date of Service: 08/23/23 which is hospital day 1  PCP: [name]    HPI: [name] is a 27 year old female with history of end-stage renal disease on hemodialysis at home, hypertension, hyperlipidemia, migraine headaches, history of osteomyelitis of the left foot, insulin-dependent diabetes mellitus type 1, who presents to emergency department for chief concerns of  shortness of breath, chest pain.  Hospital course / significant events:  10/06: Chest x-ray 2 view: Was read as increasing lung base opacity with small effusions. Admitted to hospitalist service for sepsis d/t CAP.  10/07: BCx pending, pt stable - Hgb below optimal range plan EPO with dialysis - avoiding blood products as patient is renal transplant candidate.   Consultants:  Nephrology - dialysis   Procedures/Surgeries: none      ASSESSMENT & PLAN:   Severe sepsis CAP (community acquired pneumonia) Markedly elevated leukocytosis, increased respiration rate, source of pneumonia. End organ dysfunction difficult to determine given ESRD which may also increase BNP Vancomycin and cefepime per pharmacy --> ceftriaxone  Blood cultures x 2 --> pending Check procalcitonin in the a.m. --> elevated Continue with cefepime, vancomycin per pharmacy incentive spirometry, flutter valve every 2 hours while awake Antitussive/expectorant prn    ESRD on dialysis [facility] Nephrology following    Anemia of chronic inflammation  Hgb below optimal range  plan EPO with dialysis  avoiding blood products as patient is renal transplant candidate.  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 (HCC) Insulin-dependent diabetes mellitus type 1 Basal + SSI achs with at bedtime coverage Goal inpatient blood glucose levels 140-180   Hypertension Resumed metoprolol and amlodipine    High anion gap metabolic  acidosis Secondary to end-stage renal disease on hemodialysis Nephrology following  Trend BMP   Dyslipidemia Atorvastatin 80 mg daily   Muscular chest pain Secondary to persistent cough for approximately 2 weeks Lidocaine patch, daily as needed for pain, 5 days ordered Acetaminophen 1000 mg p.o./650 rectal every 6 hours as needed for mild pain, fever, 2 days ordered Cyclobenzaprine 5 mg 3 times daily as needed for muscle spasms, muscle aches, 2 days ordered     DVT prophylaxis: SCD ~anemia IV fluids: no continuous IV fluids  Nutrition: carb modified diet  Central lines / invasive devices: none  Code Status: FULL CODE ACP documentation reviewed: 08/23/23 and none on file in [facility]  TOC needs: TBD Barriers to dispo / significant pending items: await BCx              Subjective / Brief ROS:  Patient reports feeling tired, still coughing Denies CP/SOB at rest  Pain controlled.  Denies new weakness.  Tolerating diet.  Reports no concerns w/ urination/defecation.   Family Communication: none at this time     Objective Findings:  Vitals:   08/22/23 2311 08/23/23 0357 08/23/23 0737 08/23/23 1135  BP: (!) 150/92 (!) 152/92 (!) 148/93 136/85  Pulse: (!) 108  (!) 109 96  Resp: 18 18 20 16   Temp:  99.8 F (37.7 C) 100 F (37.8 C) 99.2 F (37.3 C)  TempSrc:   Oral Oral  SpO2: 98% 99% 100% 97%  Weight:      Height:        Intake/Output Summary (Last 24 hours) at 08/23/2023 1406 Last data filed at 08/23/2023 0000 Gross per 24 hour  Intake 91.99 ml  Output 1500 ml  Net -  1408.01 ml   Filed Weights   08/22/23 1117 08/22/23 1811 08/22/23 2250  Weight: 72.6 kg 73.1 kg 71.5 kg    Examination:  Physical Exam Constitutional:      General: She is not in acute distress. Cardiovascular:     Rate and Rhythm: Normal rate and regular rhythm.  Pulmonary:     Effort: Pulmonary effort is normal.     Breath sounds: Normal breath sounds.  Abdominal:      Tenderness: There is no abdominal tenderness.  Musculoskeletal:     Right lower leg: No edema.     Left lower leg: No edema.  Skin:    General: Skin is warm and dry.  Neurological:     General: No focal deficit present.     Mental Status: She is alert and oriented to person, place, and time.  Psychiatric:        Mood and Affect: Mood normal.        Behavior: Behavior normal.          Scheduled Medications:   amLODipine  10 mg Oral Daily   atorvastatin  80 mg Oral QHS   bumetanide  1 mg Oral BID   heparin  5,000 Units Subcutaneous Q8H   insulin aspart  0-5 Units Subcutaneous QHS   insulin aspart  0-9 Units Subcutaneous Q4H   insulin glargine-yfgn  10 Units Subcutaneous QHS   metoprolol tartrate  25 mg Oral BID   montelukast  10 mg Oral QHS   multivitamin  1 tablet Oral QHS   norethindrone-ethinyl estradiol  1 tablet Oral Daily   Vitamin D (Ergocalciferol)  50,000 Units Oral Q7 days    Continuous Infusions:  cefTRIAXone (ROCEPHIN)  IV     promethazine (PHENERGAN) injection (IM or IVPB) 12.5 mg (08/22/23 2341)    PRN Medications:  acetaminophen **OR** acetaminophen, benzonatate, chlorpheniramine-HYDROcodone, cyclobenzaprine, guaiFENesin, hydrALAZINE, lidocaine, ondansetron **OR** ondansetron (ZOFRAN) IV, promethazine (PHENERGAN) injection (IM or IVPB), senna-docusate, SUMAtriptan  Antimicrobials from admission:  Anti-infectives (From admission, onward)    Start     Dose/Rate Route Frequency Ordered Stop   08/23/23 1800  ceFEPIme (MAXIPIME) 1 g in sodium chloride 0.9 % 100 mL IVPB  Status:  Discontinued        1 g 200 mL/hr over 30 Minutes Intravenous Every 24 hours 08/22/23 1431 08/23/23 1319   08/23/23 1415  cefTRIAXone (ROCEPHIN) 2 g in sodium chloride 0.9 % 100 mL IVPB        2 g 200 mL/hr over 30 Minutes Intravenous Every 24 hours 08/23/23 1319 08/27/23 1414   08/23/23 1200  vancomycin (VANCOREADY) IVPB 750 mg/150 mL  Status:  Discontinued        750 mg 150  mL/hr over 60 Minutes Intravenous Every Dialysis 08/22/23 1431 08/23/23 1319   08/22/23 1415  azithromycin (ZITHROMAX) 500 mg in sodium chloride 0.9 % 250 mL IVPB  Status:  Discontinued        500 mg 250 mL/hr over 60 Minutes Intravenous Every 24 hours 08/22/23 1407 08/22/23 1407   08/22/23 1315  vancomycin (VANCOCIN) IVPB 1000 mg/200 mL premix  Status:  Discontinued        1,000 mg 200 mL/hr over 60 Minutes Intravenous  Once 08/22/23 1308 08/22/23 1313   08/22/23 1315  ceFEPIme (MAXIPIME) 2 g in sodium chloride 0.9 % 100 mL IVPB        2 g 200 mL/hr over 30 Minutes Intravenous  Once 08/22/23 1308 08/22/23 1425   08/22/23 1315  vancomycin (VANCOREADY) IVPB 1250 mg/250 mL        1,250 mg 166.7 mL/hr over 90 Minutes Intravenous  Once 08/22/23 1313 08/22/23 1705           Data Reviewed:  I have personally reviewed the following...  CBC: Recent Labs  Lab 08/22/23 1120 08/23/23 0553  WBC 17.0* 18.7*  HGB 7.3* 7.0*  HCT 20.9* 20.8*  MCV 85.3 89.7  PLT 523* 417*   Basic Metabolic Panel: Recent Labs  Lab 08/22/23 1120 08/23/23 0553  NA 130* 132*  K 3.8 4.3  CL 93* 92*  CO2 15* 8*  GLUCOSE 172* 353*  BUN 71* 39*  CREATININE 13.83* 7.48*  CALCIUM 7.9* 7.9*   GFR: Estimated Creatinine Clearance: 11.3 mL/min (A) (by C-G formula based on SCr of 7.48 mg/dL (H)). Liver Function Tests: Recent Labs  Lab 08/23/23 0553  AST 32  ALT 22  ALKPHOS 89  BILITOT 2.7*  PROT 7.4  ALBUMIN 2.8*   No results for input(s): "LIPASE", "AMYLASE" in the last 168 hours. No results for input(s): "AMMONIA" in the last 168 hours. Coagulation Profile: No results for input(s): "INR", "PROTIME" in the last 168 hours. Cardiac Enzymes: No results for input(s): "CKTOTAL", "CKMB", "CKMBINDEX", "TROPONINI" in the last 168 hours. BNP (last 3 results) No results for input(s): "PROBNP" in the last 8760 hours. HbA1C: No results for input(s): "HGBA1C" in the last 72 hours. CBG: Recent Labs  Lab  08/22/23 1714 08/22/23 2145 08/23/23 0603 08/23/23 0734 08/23/23 1128  GLUCAP 283* 168* 336* 331* 234*   Lipid Profile: No results for input(s): "CHOL", "HDL", "LDLCALC", "TRIG", "CHOLHDL", "LDLDIRECT" in the last 72 hours. Thyroid Function Tests: No results for input(s): "TSH", "T4TOTAL", "FREET4", "T3FREE", "THYROIDAB" in the last 72 hours. Anemia Panel: No results for input(s): "VITAMINB12", "FOLATE", "FERRITIN", "TIBC", "IRON", "RETICCTPCT" in the last 72 hours. Most Recent Urinalysis On File:     Component Value Date/Time   COLORURINE YELLOW (A) 08/13/2023 1018   APPEARANCEUR CLOUDY (A) 08/13/2023 1018   LABSPEC 1.017 08/13/2023 1018   PHURINE 6.0 08/13/2023 1018   GLUCOSEU 50 (A) 08/13/2023 1018   HGBUR SMALL (A) 08/13/2023 1018   BILIRUBINUR NEGATIVE 08/13/2023 1018   KETONESUR 5 (A) 08/13/2023 1018   PROTEINUR >=300 (A) 08/13/2023 1018   NITRITE NEGATIVE 08/13/2023 1018   LEUKOCYTESUR TRACE (A) 08/13/2023 1018   Sepsis Labs: @LABRCNTIP (procalcitonin:4,lacticidven:4) Microbiology: Recent Results (from the past 240 hour(s))  Resp panel by RT-PCR (RSV, Flu A&B, Covid) Anterior Nasal Swab     Status: None   Collection Time: 08/22/23 12:38 PM   Specimen: Anterior Nasal Swab  Result Value Ref Range Status   SARS Coronavirus 2 by RT PCR NEGATIVE NEGATIVE Final    Comment: (NOTE) SARS-CoV-2 target nucleic acids are NOT DETECTED.  The SARS-CoV-2 RNA is generally detectable in upper respiratory specimens during the acute phase of infection. The lowest concentration of SARS-CoV-2 viral copies this assay can detect is 138 copies/mL. A negative result does not preclude SARS-Cov-2 infection and should not be used as the sole basis for treatment or other patient management decisions. A negative result may occur with  improper specimen collection/handling, submission of specimen other than nasopharyngeal swab, presence of viral mutation(s) within the areas targeted by this  assay, and inadequate number of viral copies(<138 copies/mL). A negative result must be combined with clinical observations, patient history, and epidemiological information. The expected result is Negative.  Fact Sheet for Patients:  BloggerCourse.com  Fact Sheet for Healthcare Providers:  SeriousBroker.it  This test is no t yet approved or cleared by the [place] and  has been authorized for detection and/or diagnosis of SARS-CoV-2 by FDA under an Emergency Use Authorization (EUA). This EUA will remain  in effect (meaning this test can be used) for the duration of the COVID-19 declaration under Section 564(b)(1) of the Act, 21 U.S.C.section 360bbb-3(b)(1), unless the authorization is terminated  or revoked sooner.       Influenza A by PCR NEGATIVE NEGATIVE Final   Influenza B by PCR NEGATIVE NEGATIVE Final    Comment: (NOTE) The Xpert Xpress SARS-CoV-2/FLU/RSV plus assay is intended as an aid in the diagnosis of influenza from Nasopharyngeal swab specimens and should not be used as a sole basis for treatment. Nasal washings and aspirates are unacceptable for Xpert Xpress SARS-CoV-2/FLU/RSV testing.  Fact Sheet for Patients: BloggerCourse.com  Fact Sheet for Healthcare Providers: SeriousBroker.it  This test is not yet approved or cleared by the [place] FDA and has been authorized for detection and/or diagnosis of SARS-CoV-2 by FDA under an Emergency Use Authorization (EUA). This EUA will remain in effect (meaning this test can be used) for the duration of the COVID-19 declaration under Section 564(b)(1) of the Act, 21 U.S.C. section 360bbb-3(b)(1), unless the authorization is terminated or revoked.     Resp Syncytial Virus by PCR NEGATIVE NEGATIVE Final    Comment: (NOTE) Fact Sheet for Patients: BloggerCourse.com  Fact Sheet for  Healthcare Providers: SeriousBroker.it  This test is not yet approved or cleared by the [place] FDA and has been authorized for detection and/or diagnosis of SARS-CoV-2 by FDA under an Emergency Use Authorization (EUA). This EUA will remain in effect (meaning this test can be used) for the duration of the COVID-19 declaration under Section 564(b)(1) of the Act, 21 U.S.C. section 360bbb-3(b)(1), unless the authorization is terminated or revoked.  Performed at [facility], [address] [place], [place] [address]   MRSA Next Gen by PCR, Nasal     Status: None   Collection Time: 08/23/23 10:26 AM   Specimen: Nasal Mucosa; Nasal Swab  Result Value Ref Range Status   MRSA by PCR Next Gen NOT DETECTED NOT DETECTED Final    Comment: (NOTE) The GeneXpert MRSA Assay (FDA approved for NASAL specimens only), is one component of a comprehensive MRSA colonization surveillance program. It is not intended to diagnose MRSA infection nor to guide or monitor treatment for MRSA infections. Test performance is not FDA approved in patients less than 30 years old. Performed at [facility], [address] [place], [place] [address]       Radiology Studies last 3 days: DG Chest 2 View  Result Date: 08/22/2023 CLINICAL DATA:  Chest pain EXAM: CHEST - 2 VIEW COMPARISON:  08/13/2023 and older FINDINGS: Underinflation. Tiny effusions with some increasing adjacent opacities. Atelectasis favored over infiltrate recommend follow-up. No pneumothorax or edema. Normal cardiopericardial silhouette. IMPRESSION: Increasing lung base opacities with small effusions. Recommend follow-up Electronically Signed   By: Karen Kays M.D.   On: 08/22/2023 11:54          Sunnie Nielsen, DO [facility] 08/23/2023, 2:06 PM    Dictation software may have been used to generate the above note. Typos may occur and escape review in typed/dictated  notes. Please contact Dr Lyn Hollingshead directly for clarity if needed.  Staff may message me via secure chat in Epic  but this may not receive an immediate response,  please page me  for urgent matters!  If 7PM-7AM, please contact night coverage [email or web address]

## 2023-08-23 NOTE — Progress Notes (Signed)
[facility]  ROUNDING NOTE   Subjective:   Emma Thomas  is a 27 y.o female with past medical history of diabetes, anemia, and diabetes type 1, and end stage renal disease on home hemodialysis dialysis. Patient presents to the ED with weakness, shortness of breath and chest pain. She has been admitted for ESRD on hemodialysis (HCC) [N18.6, Z99.2] Severe sepsis (HCC) [A41.9, R65.20] Failure of outpatient treatment [Z78.9] Community acquired pneumonia, unspecified laterality [J18.9]  Patient is known to our practice and completes home hemodialysis supervised by Dr. Cherylann Ratel.Has not completed a treatment all week due to weakness and feeling bad. Continues to have nausea with vomiting overnight. Also complains of back pain, increased with vomiting.   Labs on ED arrival significant for sodium 133, glucose 353, BUN 39, calcium 7.9, albumin 2.8, WBC 18.7 and hgb 7.0.  We have been consulted to manage dialysis needs.   Objective:  Vital signs in last 24 hours:  Temp:  [98.5 F (36.9 C)-100.8 F (38.2 C)] 99.2 F (37.3 C) (10/07 1135) Pulse Rate:  [88-110] 96 (10/07 1135) Resp:  [16-20] 16 (10/07 1135) BP: (126-166)/(84-108) 136/85 (10/07 1135) SpO2:  [92 %-100 %] 97 % (10/07 1135) Weight:  [71.5 kg-73.1 kg] 71.5 kg (10/06 2250)  Weight change:  Filed Weights   08/22/23 1117 08/22/23 1811 08/22/23 2250  Weight: 72.6 kg 73.1 kg 71.5 kg    Intake/Output: I/O last 3 completed shifts: In: 16 [IV Piggyback:92] Out: 1500 [Other:1500]   Intake/Output this shift:  No intake/output data recorded.  Physical Exam: General: Ill appearing  Head: Normocephalic, atraumatic. Moist oral mucosal membranes  Eyes: Anicteric  Lungs:  Clear to auscultation  Heart: Regular rate and rhythm  Abdomen:  Soft, nontender  Extremities:  No peripheral edema.  Neurologic: Nonfocal, moving all four extremities  Skin: No lesions  Access: Rt AVF    Basic Metabolic Panel: Recent Labs  Lab  08/22/23 1120 08/23/23 0553  NA 130* 132*  K 3.8 4.3  CL 93* 92*  CO2 15* 8*  GLUCOSE 172* 353*  BUN 71* 39*  CREATININE 13.83* 7.48*  CALCIUM 7.9* 7.9*    Liver Function Tests: Recent Labs  Lab 08/23/23 0553  AST 32  ALT 22  ALKPHOS 89  BILITOT 2.7*  PROT 7.4  ALBUMIN 2.8*   No results for input(s): "LIPASE", "AMYLASE" in the last 168 hours. No results for input(s): "AMMONIA" in the last 168 hours.  CBC: Recent Labs  Lab 08/22/23 1120 08/23/23 0553  WBC 17.0* 18.7*  HGB 7.3* 7.0*  HCT 20.9* 20.8*  MCV 85.3 89.7  PLT 523* 417*    Cardiac Enzymes: No results for input(s): "CKTOTAL", "CKMB", "CKMBINDEX", "TROPONINI" in the last 168 hours.  BNP: Invalid input(s): "POCBNP"  CBG: Recent Labs  Lab 08/22/23 1714 08/22/23 2145 08/23/23 0603 08/23/23 0734 08/23/23 1128  GLUCAP 283* 168* 336* 331* 234*    Microbiology: Results for orders placed or performed during the hospital encounter of 08/22/23  Resp panel by RT-PCR (RSV, Flu A&B, Covid) Anterior Nasal Swab     Status: None   Collection Time: 08/22/23 12:38 PM   Specimen: Anterior Nasal Swab  Result Value Ref Range Status   SARS Coronavirus 2 by RT PCR NEGATIVE NEGATIVE Final    Comment: (NOTE) SARS-CoV-2 target nucleic acids are NOT DETECTED.  The SARS-CoV-2 RNA is generally detectable in upper respiratory specimens during the acute phase of infection. The lowest concentration of SARS-CoV-2 viral copies this assay can detect is  138 copies/mL. A negative result does not preclude SARS-Cov-2 infection and should not be used as the sole basis for treatment or other patient management decisions. A negative result may occur with  improper specimen collection/handling, submission of specimen other than nasopharyngeal swab, presence of viral mutation(s) within the areas targeted by this assay, and inadequate number of viral copies(<138 copies/mL). A negative result must be combined with clinical  observations, patient history, and epidemiological information. The expected result is Negative.  Fact Sheet for Patients:  BloggerCourse.com  Fact Sheet for Healthcare Providers:  SeriousBroker.it  This test is no t yet approved or cleared by the [place] FDA and  has been authorized for detection and/or diagnosis of SARS-CoV-2 by FDA under an Emergency Use Authorization (EUA). This EUA will remain  in effect (meaning this test can be used) for the duration of the COVID-19 declaration under Section 564(b)(1) of the Act, 21 U.S.C.section 360bbb-3(b)(1), unless the authorization is terminated  or revoked sooner.       Influenza A by PCR NEGATIVE NEGATIVE Final   Influenza B by PCR NEGATIVE NEGATIVE Final    Comment: (NOTE) The Xpert Xpress SARS-CoV-2/FLU/RSV plus assay is intended as an aid in the diagnosis of influenza from Nasopharyngeal swab specimens and should not be used as a sole basis for treatment. Nasal washings and aspirates are unacceptable for Xpert Xpress SARS-CoV-2/FLU/RSV testing.  Fact Sheet for Patients: BloggerCourse.com  Fact Sheet for Healthcare Providers: SeriousBroker.it  This test is not yet approved or cleared by the [place] FDA and has been authorized for detection and/or diagnosis of SARS-CoV-2 by FDA under an Emergency Use Authorization (EUA). This EUA will remain in effect (meaning this test can be used) for the duration of the COVID-19 declaration under Section 564(b)(1) of the Act, 21 U.S.C. section 360bbb-3(b)(1), unless the authorization is terminated or revoked.     Resp Syncytial Virus by PCR NEGATIVE NEGATIVE Final    Comment: (NOTE) Fact Sheet for Patients: BloggerCourse.com  Fact Sheet for Healthcare Providers: SeriousBroker.it  This test is not yet approved or cleared by  the [place] FDA and has been authorized for detection and/or diagnosis of SARS-CoV-2 by FDA under an Emergency Use Authorization (EUA). This EUA will remain in effect (meaning this test can be used) for the duration of the COVID-19 declaration under Section 564(b)(1) of the Act, 21 U.S.C. section 360bbb-3(b)(1), unless the authorization is terminated or revoked.  Performed at [facility], [address] [place], [place] [address]   MRSA Next Gen by PCR, Nasal     Status: None   Collection Time: 08/23/23 10:26 AM   Specimen: Nasal Mucosa; Nasal Swab  Result Value Ref Range Status   MRSA by PCR Next Gen NOT DETECTED NOT DETECTED Final    Comment: (NOTE) The GeneXpert MRSA Assay (FDA approved for NASAL specimens only), is one component of a comprehensive MRSA colonization surveillance program. It is not intended to diagnose MRSA infection nor to guide or monitor treatment for MRSA infections. Test performance is not FDA approved in patients less than 69 years old. Performed at [facility], [address] [place], [place] [address]     Coagulation Studies: No results for input(s): "LABPROT", "INR" in the last 72 hours.  Urinalysis: No results for input(s): "COLORURINE", "LABSPEC", "PHURINE", "GLUCOSEU", "HGBUR", "BILIRUBINUR", "KETONESUR", "PROTEINUR", "UROBILINOGEN", "NITRITE", "LEUKOCYTESUR" in the last 72 hours.  Invalid input(s): "APPERANCEUR"    Imaging: DG Chest 2 View  Result Date: 08/22/2023 CLINICAL DATA:  Chest pain EXAM: CHEST - 2 VIEW COMPARISON:  08/13/2023 and older FINDINGS: Underinflation. Tiny effusions with some increasing adjacent opacities. Atelectasis favored over infiltrate recommend follow-up. No pneumothorax or edema. Normal cardiopericardial silhouette. IMPRESSION: Increasing lung base opacities with small effusions. Recommend follow-up Electronically Signed   By: Karen Kays M.D.   On: 08/22/2023 11:54     Medications:     ceFEPime (MAXIPIME) IV     promethazine (PHENERGAN) injection (IM or IVPB) 12.5 mg (08/22/23 2341)   vancomycin      atorvastatin  80 mg Oral QHS   bumetanide  1 mg Oral BID   heparin  5,000 Units Subcutaneous Q8H   insulin aspart  0-5 Units Subcutaneous QHS   insulin aspart  0-9 Units Subcutaneous Q4H   montelukast  10 mg Oral QHS   multivitamin  1 tablet Oral QHS   Vitamin D (Ergocalciferol)  50,000 Units Oral Q7 days   acetaminophen **OR** acetaminophen, chlorpheniramine-HYDROcodone, cyclobenzaprine, guaiFENesin, hydrALAZINE, lidocaine, ondansetron **OR** ondansetron (ZOFRAN) IV, promethazine (PHENERGAN) injection (IM or IVPB), senna-docusate, SUMAtriptan, vancomycin  Assessment/ Plan:  Emma Thomas is a 27 y.o.  female with past medical history of diabetes, anemia, and diabetes type 1, and end stage renal disease on home hemodialysis dialysis. Patient presents to the ED with weakness, shortness of breath and chest pain. She has been admitted for ESRD on hemodialysis (HCC) [N18.6, Z99.2] Severe sepsis (HCC) [A41.9, R65.20] Failure of outpatient treatment [Z78.9] Community acquired pneumonia, unspecified laterality [J18.9]  [facility]  End stage renal disease on home hemodialysis. Has not completed a treatment this week. Received treatment overnight with UF 1.5L. Next treatment scheduled for Tuesday.   2. Anemia of chronic kidney disease Lab Results  Component Value Date   HGB 7.0 (L) 08/23/2023    Hgb below optimal range. Will order EPO with dialysis. Suggest avoiding blood products as patient is renal transplant candidate.   3. Secondary Hyperparathyroidism: with outpatient labs: none available.   Lab Results  Component Value Date   CALCIUM 7.9 (L) 08/23/2023   CAION 0.97 (L) 01/08/2023   PHOS 5.6 (H) 07/10/2023    Will continue to monitor bone minerals.   4. 4. Diabetes mellitus type I with chronic kidney disease/renal manifestations: insulin  dependent. Home regimen includes humalog. Most recent hemoglobin A1c is 8.5 on 06/18/23.    LOS: 1   10/7/20241:11 PM

## 2023-08-23 NOTE — Plan of Care (Signed)
  Problem: Fluid Volume: Goal: Hemodynamic stability will improve Outcome: Progressing   Problem: Education: Goal: Ability to describe self-care measures that may prevent or decrease complications (Diabetes Survival Skills Education) will improve Outcome: Progressing   Problem: Health Behavior/Discharge Planning: Goal: Ability to manage health-related needs will improve Outcome: Progressing   Problem: Fluid Volume: Goal: Ability to achieve a balanced intake and output will improve Outcome: Progressing   Problem: Metabolic: Goal: Ability to maintain appropriate glucose levels will improve Outcome: Progressing   Problem: Nutritional: Goal: Maintenance of adequate nutrition will improve Outcome: Progressing

## 2023-08-23 NOTE — Plan of Care (Signed)
  Problem: Clinical Measurements: Goal: Diagnostic test results will improve Outcome: Progressing   Problem: Cardiac: Goal: Ability to maintain an adequate cardiac output will improve Outcome: Progressing

## 2023-08-23 NOTE — TOC Initial Note (Signed)
Transition of Care [facility]) - Progression Note    Patient Details  Name: Emma Thomas MRN: [id number] Date of Birth: 07/03/1996  Transition of Care [facility]) CM/SW Contact  Truddie Hidden, RN Phone Number: 08/23/2023, 2:58 PM  Clinical Narrative:    Risk assessment completed.   Admitted RKY:[id number] Admitted from: "Home with family"  PCP: [facility], [place]  Pharmacy:[facility]  Current home health/prior home health/DME:None Transporation: "I drive myself." Husband will transport home at discharge.     Expected Discharge Plan: Home/Self Care Barriers to Discharge: Continued Medical Work up  Expected Discharge Plan and Services       Living arrangements for the past 2 months: Single Family Home                                       Social Determinants of Health (SDOH) Interventions SDOH Screenings   Food Insecurity: No Food Insecurity (08/23/2023)  Housing: Low Risk  (08/23/2023)  Transportation Needs: No Transportation Needs (08/23/2023)  Utilities: Not At Risk (08/23/2023)  Financial Resource Strain: Low Risk  (07/15/2023)   Received from [facility]  Tobacco Use: Low Risk  (08/23/2023)    Readmission Risk Interventions    08/23/2023    2:55 PM 07/09/2023   11:05 AM 06/21/2023    1:28 PM  Readmission Risk Prevention Plan  Transportation Screening Complete Complete Complete  PCP or Specialist Appt within 3-5 Days   Complete  HRI or Home Care Consult   Patient refused  Social Work Consult for Recovery Care Planning/Counseling   Complete  Palliative Care Screening   Not Applicable  Medication Review Oceanographer) Complete Complete Complete  PCP or Specialist appointment within 3-5 days of discharge Complete Complete   HRI or Home Care Consult Complete Not Complete   HRI or Home Care Consult Pt Refusal Comments  pt stated she does not have home care at this time and did not want home care.   SW Recovery Care/Counseling Consult  Complete Complete   Palliative Care Screening Not Applicable Not Applicable   Skilled Nursing Facility Complete Not Applicable

## 2023-08-23 NOTE — Inpatient Diabetes Management (Signed)
Inpatient Diabetes Program Recommendations  AACE/ADA: New Consensus Statement on Inpatient Glycemic Control (2015)  Target Ranges:  Prepandial:   less than 140 mg/dL      Peak postprandial:   less than 180 mg/dL (1-2 hours)      Critically ill patients:  140 - 180 mg/dL   Lab Results  Component Value Date   GLUCAP 331 (H) 08/23/2023   HGBA1C 8.5 (H) 06/18/2023    Latest Reference Range & Units 08/22/23 17:14 08/22/23 21:45 08/23/23 06:03 08/23/23 07:34  Glucose-Capillary 70 - 99 mg/dL 732 (H) 202 (H) 542 (H) 331 (H)  (H): Data is abnormally high   History: Type 1 Diabetes, ESRD   Home DM Meds: Tandem Insulin Pump (see below for pump settings)   Current Orders: Scale/ SSI (0-9 units) Q4 hours  Inpatient Diabetes Program Recommendations:   Please consider: -Semglee 10 units now and Q24H -Novolog 0-9 units TID with meals, Novolog 0-5 units at bedtime.   Spoke with patient @ bedside. Patient states she needs a new tubing for her insulin pump and plans to call her family to ask if someone can bring to her. Updated Dr. Lyn Hollingshead.  NOTE: Patient has DM1 and sees [facility] for DM management and is well known to inpatient diabetes team due to frequent ED visit and hospital admissions. Patient was most recently in the ED 08/13/23.  Patient had televisit with Demaris Callander, PA ([facility]) on 04/06/23. Per office note on 04/06/23, patient's CBGs had been running high so "HIGH" profile was created. The following should be pump settings:   Thank you, Billy Fischer. Ariam Mol, RN, MSN, CDE  Diabetes Coordinator Inpatient Glycemic Control Team Team Pager [phone] (8am-5pm) 08/23/2023 10:58 AM

## 2023-08-24 DIAGNOSIS — R6520 Severe sepsis without septic shock: Secondary | ICD-10-CM | POA: Diagnosis not present

## 2023-08-24 DIAGNOSIS — A419 Sepsis, unspecified organism: Secondary | ICD-10-CM | POA: Diagnosis not present

## 2023-08-24 LAB — BASIC METABOLIC PANEL
Anion gap: 19 — ABNORMAL HIGH (ref 5–15)
BUN: 44 mg/dL — ABNORMAL HIGH (ref 6–20)
CO2: 21 mmol/L — ABNORMAL LOW (ref 22–32)
Calcium: 8.2 mg/dL — ABNORMAL LOW (ref 8.9–10.3)
Chloride: 95 mmol/L — ABNORMAL LOW (ref 98–111)
Creatinine, Ser: 8.71 mg/dL — ABNORMAL HIGH (ref 0.44–1.00)
GFR, Estimated: 6 mL/min — ABNORMAL LOW (ref >60)
Glucose, Bld: 197 mg/dL — ABNORMAL HIGH (ref 70–99)
Potassium: 3.3 mmol/L — ABNORMAL LOW (ref 3.5–5.1)
Sodium: 135 mmol/L (ref 135–145)

## 2023-08-24 LAB — GLUCOSE, CAPILLARY
Glucose-Capillary: 225 mg/dL — ABNORMAL HIGH (ref 70–99)
Glucose-Capillary: 204 mg/dL — ABNORMAL HIGH (ref 70–99)
Glucose-Capillary: 218 mg/dL — ABNORMAL HIGH (ref 70–99)
Glucose-Capillary: 129 mg/dL — ABNORMAL HIGH (ref 70–99)
Glucose-Capillary: 113 mg/dL — ABNORMAL HIGH (ref 70–99)

## 2023-08-24 LAB — CBC
HCT: 21.6 % — ABNORMAL LOW (ref 36.0–46.0)
Hemoglobin: 7.2 g/dL — ABNORMAL LOW (ref 12.0–15.0)
MCH: 29.3 pg (ref 26.0–34.0)
MCHC: 33.3 g/dL (ref 30.0–36.0)
MCV: 87.8 fL (ref 80.0–100.0)
Platelets: 458 10*3/uL — ABNORMAL HIGH (ref 150–400)
RBC: 2.46 MIL/uL — ABNORMAL LOW (ref 3.87–5.11)
RDW: 13.7 % (ref 11.5–15.5)
WBC: 14.4 10*3/uL — ABNORMAL HIGH (ref 4.0–10.5)
nRBC: 0 % (ref 0.0–0.2)

## 2023-08-24 MED ORDER — EPOETIN ALFA 10000 UNIT/ML IJ SOLN
10000.0000 [IU] | INTRAMUSCULAR | Status: DC
  Administered 2023-08-24 – 2023-08-26 (×2): 10000 [IU] via INTRAVENOUS
  Filled 2023-08-24: qty 1

## 2023-08-24 MED ORDER — INSULIN ASPART 100 UNIT/ML IJ SOLN
0.0000 [IU] | Freq: Every day | INTRAMUSCULAR | Status: DC
  Administered 2023-08-25: 2 [IU] via SUBCUTANEOUS
  Filled 2023-08-24: qty 1

## 2023-08-24 MED ORDER — INSULIN GLARGINE-YFGN 100 UNIT/ML ~~LOC~~ SOLN
14.0000 [IU] | Freq: Every day | SUBCUTANEOUS | Status: DC
  Administered 2023-08-24 – 2023-08-25 (×2): 14 [IU] via SUBCUTANEOUS
  Filled 2023-08-24 (×3): qty 0.14

## 2023-08-24 MED ORDER — EPOETIN ALFA 10000 UNIT/ML IJ SOLN
INTRAMUSCULAR | Status: AC
  Filled 2023-08-24: qty 1

## 2023-08-24 MED ORDER — AMOXICILLIN-POT CLAVULANATE 875-125 MG PO TABS
1.0000 | ORAL_TABLET | Freq: Two times a day (BID) | ORAL | Status: DC
  Administered 2023-08-24 – 2023-08-25 (×2): 1 via ORAL
  Filled 2023-08-24 (×2): qty 1

## 2023-08-24 MED ORDER — GUAIFENESIN-CODEINE 100-10 MG/5ML PO SOLN
5.0000 mL | ORAL | Status: DC | PRN
  Administered 2023-08-24 – 2023-08-25 (×3): 5 mL via ORAL
  Filled 2023-08-24 (×3): qty 5

## 2023-08-24 MED ORDER — OXYCODONE-ACETAMINOPHEN 5-325 MG PO TABS
1.0000 | ORAL_TABLET | Freq: Four times a day (QID) | ORAL | Status: DC | PRN
  Administered 2023-08-24 – 2023-08-26 (×6): 1 via ORAL
  Filled 2023-08-24 (×5): qty 1

## 2023-08-24 MED ORDER — HYDROMORPHONE HCL 1 MG/ML IJ SOLN
0.5000 mg | Freq: Once | INTRAMUSCULAR | Status: AC
  Administered 2023-08-24: 0.5 mg via INTRAVENOUS
  Filled 2023-08-24: qty 0.5

## 2023-08-24 MED ORDER — AZITHROMYCIN 250 MG PO TABS
500.0000 mg | ORAL_TABLET | Freq: Every day | ORAL | Status: DC
  Administered 2023-08-25 – 2023-08-26 (×2): 500 mg via ORAL
  Filled 2023-08-24 (×2): qty 2

## 2023-08-24 MED ORDER — ONDANSETRON HCL 4 MG/2ML IJ SOLN
INTRAMUSCULAR | Status: AC
  Filled 2023-08-24: qty 2

## 2023-08-24 MED ORDER — INSULIN ASPART 100 UNIT/ML IJ SOLN
0.0000 [IU] | Freq: Three times a day (TID) | INTRAMUSCULAR | Status: DC
  Administered 2023-08-24: 3 [IU] via SUBCUTANEOUS
  Administered 2023-08-24 – 2023-08-25 (×2): 1 [IU] via SUBCUTANEOUS
  Filled 2023-08-24 (×3): qty 1

## 2023-08-24 MED ORDER — OXYCODONE-ACETAMINOPHEN 5-325 MG PO TABS
ORAL_TABLET | ORAL | Status: AC
  Filled 2023-08-24: qty 1

## 2023-08-24 NOTE — Procedures (Signed)
Received patient in bed to unit.  Alert and oriented.  Informed consent signed and in chart.   TX duration: 2hrs and 39 mins.   Patient tolerated well. Fistula got infiltrated. HD tx ended early. Transported back to the room  Alert, without acute distress.  Hand-off given to patient's nurse.   Access used: Right upper arm fistula.  Access issues: Infiltrated  Total UF removed: 900 ml.  Medication(s) given: Zofran 4mg  IV, Percocet 5-325 mg tab, Epogen 10000 units.     [facility]

## 2023-08-24 NOTE — Progress Notes (Addendum)
PROGRESS NOTE    Emma Thomas   VHQ:[id number] DOB: 21-Jul-1996  DOA: 08/22/2023 Date of Service: 08/24/23 which is hospital day 2  [facility]P: [name]    HPI: Emma Thomas is a 27 year old female with history of end-stage renal disease on hemodialysis at home, hypertension, hyperlipidemia, migraine headaches, history of osteomyelitis of the left foot, insulin-dependent diabetes mellitus type 1, who presents to emergency department for chief concerns of  shortness of breath, chest pain.  Hospital course / significant events:  10/06: Chest x-ray 2 view: Was read as increasing lung base opacity with small effusions. Admitted to hospitalist service for sepsis d/t CAP.  10/07: BCx pending, pt stable - Hgb below optimal range plan EPO with dialysis - avoiding blood products as patient is renal transplant candidate.  10/08: BCx NGx2d. Persistent nausea, unable to tolerate po, have d/c IV abx which hopefully will help, gave single dose IV pain meds and advised NO FURTHER pain meds IV. Plan next dialysis treatment later today. If tolerating po can hopefully d/c home tomorrow, HD today  Consultants:  Nephrology - dialysis   Procedures/Surgeries: none      ASSESSMENT & PLAN:   Severe sepsis CAP (community acquired pneumonia) Markedly elevated leukocytosis, increased respiration rate, source of pneumonia. End organ dysfunction difficult to determine given ESRD which may also increase BNP. Procalcitonin was elevated.  Vancomycin and cefepime per pharmacy --> ceftriaxone IV --> augmentin + azithro po today  Blood cultures x 2 --> no growth x2 days incentive spirometry, flutter valve every 2 hours while awake Antitussive/expectorant prn    ESRD on home hemodialysis  Nephrology following    Anemia of chronic inflammation  Hgb below optimal range  plan EPO with dialysis  avoiding blood products as patient is renal transplant candidate.  Type 1 diabetes mellitus with  stage 3 chronic kidney disease (HCC) Insulin-dependent diabetes mellitus type 1 Basal + SSI achs with at bedtime coverage Goal inpatient blood glucose levels 140-180   Hypertension Resumed metoprolol and amlodipine    High anion gap metabolic acidosis Secondary to end-stage renal disease on hemodialysis Nephrology following  Trend BMP   Dyslipidemia Atorvastatin 80 mg daily   Muscular chest pain Secondary to persistent cough for approximately 2 weeks Lidocaine patch, daily as needed for pain, 5 days ordered Acetaminophen 1000 mg p.o./650 rectal every 6 hours as needed for mild pain, fever, 2 days ordered Cyclobenzaprine 5 mg 3 times daily as needed for muscle spasms, muscle aches,    DVT prophylaxis: SCD ~anemia IV fluids: no continuous IV fluids  Nutrition: carb modified diet  Central lines / invasive devices: none  Code Status: FULL CODE ACP documentation reviewed: 08/23/23 and none on file in [facility]  TOC needs: TBD Barriers to dispo / significant pending items: await BCx              Subjective / Brief ROS:  Patient reports severe nausea, "Pain all over" feeling tired, still coughing Denies CP/SOB at rest  Denies new weakness.  Not tolerating diet.    Family Communication: none at this time     Objective Findings:  Vitals:   08/24/23 1600 08/24/23 1630 08/24/23 1700 08/24/23 1730  BP: 112/74 126/72 121/76 (!) 153/91  Pulse: 84 83 81 85  Resp: 10 12 15 14   Temp:      TempSrc:      SpO2: 98% 92% 93% 96%  Weight:      Height:  Intake/Output Summary (Last 24 hours) at 08/24/2023 1736 Last data filed at 08/24/2023 1030 Gross per 24 hour  Intake 155.09 ml  Output --  Net 155.09 ml   Filed Weights   08/22/23 1811 08/22/23 2250 08/24/23 1451  Weight: 73.1 kg 71.5 kg 68.2 kg    Examination:  Physical Exam Constitutional:      General: She is not in acute distress.    Comments: She is vomiting some and dry heaving on rounds   Cardiovascular:     Rate and Rhythm: Normal rate and regular rhythm.  Pulmonary:     Effort: Pulmonary effort is normal.     Breath sounds: Normal breath sounds.  Abdominal:     Tenderness: There is no abdominal tenderness.  Musculoskeletal:     Right lower leg: No edema.     Left lower leg: No edema.  Skin:    General: Skin is warm and dry.  Neurological:     General: No focal deficit present.     Mental Status: She is alert and oriented to person, place, and time.  Psychiatric:        Mood and Affect: Mood normal.        Behavior: Behavior normal.          Scheduled Medications:   amLODipine  10 mg Oral Daily   amoxicillin-clavulanate  1 tablet Oral Q12H   atorvastatin  80 mg Oral QHS   [START ON 08/25/2023] azithromycin  500 mg Oral Daily   bumetanide  1 mg Oral BID   epoetin (EPOGEN/PROCRIT) injection  10,000 Units Intravenous Q T,Th,Sa-HD   heparin  5,000 Units Subcutaneous Q8H   insulin aspart  0-5 Units Subcutaneous QHS   insulin aspart  0-9 Units Subcutaneous TID WC   insulin glargine-yfgn  14 Units Subcutaneous QHS   metoprolol tartrate  25 mg Oral BID   montelukast  10 mg Oral QHS   multivitamin  1 tablet Oral QHS   norethindrone-ethinyl estradiol  1 tablet Oral Daily   Vitamin D (Ergocalciferol)  50,000 Units Oral Q7 days    Continuous Infusions:  promethazine (PHENERGAN) injection (IM or IVPB) 12.5 mg (08/24/23 0158)    PRN Medications:  benzonatate, guaiFENesin-codeine, hydrALAZINE, lidocaine, metoCLOPramide (REGLAN) injection, ondansetron **OR** ondansetron (ZOFRAN) IV, promethazine (PHENERGAN) injection (IM or IVPB), senna-docusate, SUMAtriptan  Antimicrobials from admission:  Anti-infectives (From admission, onward)    Start     Dose/Rate Route Frequency Ordered Stop   08/25/23 1000  azithromycin (ZITHROMAX) tablet 500 mg        500 mg Oral Daily 08/24/23 1322 08/28/23 0959   08/24/23 2000  amoxicillin-clavulanate (AUGMENTIN) 875-125 MG per  tablet 1 tablet        1 tablet Oral Every 12 hours 08/24/23 1322 08/29/23 0959   08/23/23 1800  ceFEPIme (MAXIPIME) 1 g in sodium chloride 0.9 % 100 mL IVPB  Status:  Discontinued        1 g 200 mL/hr over 30 Minutes Intravenous Every 24 hours 08/22/23 1431 08/23/23 1319   08/23/23 1415  cefTRIAXone (ROCEPHIN) 2 g in sodium chloride 0.9 % 100 mL IVPB  Status:  Discontinued        2 g 200 mL/hr over 30 Minutes Intravenous Every 24 hours 08/23/23 1319 08/24/23 1103   08/23/23 1200  vancomycin (VANCOREADY) IVPB 750 mg/150 mL  Status:  Discontinued        750 mg 150 mL/hr over 60 Minutes Intravenous Every Dialysis 08/22/23 1431 08/23/23 1319  08/22/23 1415  azithromycin (ZITHROMAX) 500 mg in sodium chloride 0.9 % 250 mL IVPB  Status:  Discontinued        500 mg 250 mL/hr over 60 Minutes Intravenous Every 24 hours 08/22/23 1407 08/22/23 1407   08/22/23 1315  vancomycin (VANCOCIN) IVPB 1000 mg/200 mL premix  Status:  Discontinued        1,000 mg 200 mL/hr over 60 Minutes Intravenous  Once 08/22/23 1308 08/22/23 1313   08/22/23 1315  ceFEPIme (MAXIPIME) 2 g in sodium chloride 0.9 % 100 mL IVPB        2 g 200 mL/hr over 30 Minutes Intravenous  Once 08/22/23 1308 08/22/23 1425   08/22/23 1315  vancomycin (VANCOREADY) IVPB 1250 mg/250 mL        1,250 mg 166.7 mL/hr over 90 Minutes Intravenous  Once 08/22/23 1313 08/22/23 1705           Data Reviewed:  I have personally reviewed the following...  CBC: Recent Labs  Lab 08/22/23 1120 08/23/23 0553 08/24/23 0531  WBC 17.0* 18.7* 14.4*  HGB 7.3* 7.0* 7.2*  HCT 20.9* 20.8* 21.6*  MCV 85.3 89.7 87.8  PLT 523* 417* 458*   Basic Metabolic Panel: Recent Labs  Lab 08/22/23 1120 08/23/23 0553 08/24/23 0531  NA 130* 132* 135  K 3.8 4.3 3.3*  CL 93* 92* 95*  CO2 15* 8* 21*  GLUCOSE 172* 353* 197*  BUN 71* 39* 44*  CREATININE 13.83* 7.48* 8.71*  CALCIUM 7.9* 7.9* 8.2*   GFR: Estimated Creatinine Clearance: 9 mL/min (A) (by C-G  formula based on SCr of 8.71 mg/dL (H)). Liver Function Tests: Recent Labs  Lab 08/23/23 0553  AST 32  ALT 22  ALKPHOS 89  BILITOT 2.7*  PROT 7.4  ALBUMIN 2.8*   No results for input(s): "LIPASE", "AMYLASE" in the last 168 hours. No results for input(s): "AMMONIA" in the last 168 hours. Coagulation Profile: No results for input(s): "INR", "PROTIME" in the last 168 hours. Cardiac Enzymes: No results for input(s): "CKTOTAL", "CKMB", "CKMBINDEX", "TROPONINI" in the last 168 hours. BNP (last 3 results) No results for input(s): "PROBNP" in the last 8760 hours. HbA1C: No results for input(s): "HGBA1C" in the last 72 hours. CBG: Recent Labs  Lab 08/23/23 1954 08/23/23 2343 08/24/23 0325 08/24/23 0813 08/24/23 1215  GLUCAP 285* 271* 225* 204* 218*   Lipid Profile: No results for input(s): "CHOL", "HDL", "LDLCALC", "TRIG", "CHOLHDL", "LDLDIRECT" in the last 72 hours. Thyroid Function Tests: No results for input(s): "TSH", "T4TOTAL", "FREET4", "T3FREE", "THYROIDAB" in the last 72 hours. Anemia Panel: No results for input(s): "VITAMINB12", "FOLATE", "FERRITIN", "TIBC", "IRON", "RETICCTPCT" in the last 72 hours. Most Recent Urinalysis On File:     Component Value Date/Time   COLORURINE YELLOW (A) 08/13/2023 1018   APPEARANCEUR CLOUDY (A) 08/13/2023 1018   LABSPEC 1.017 08/13/2023 1018   PHURINE 6.0 08/13/2023 1018   GLUCOSEU 50 (A) 08/13/2023 1018   HGBUR SMALL (A) 08/13/2023 1018   BILIRUBINUR NEGATIVE 08/13/2023 1018   KETONESUR 5 (A) 08/13/2023 1018   PROTEINUR >=300 (A) 08/13/2023 1018   NITRITE NEGATIVE 08/13/2023 1018   LEUKOCYTESUR TRACE (A) 08/13/2023 1018   Sepsis Labs: @LABRCNTIP (procalcitonin:4,lacticidven:4) Microbiology: Recent Results (from the past 240 hour(s))  Resp panel by RT-PCR (RSV, Flu A&B, Covid) Anterior Nasal Swab     Status: None   Collection Time: 08/22/23 12:38 PM   Specimen: Anterior Nasal Swab  Result Value Ref Range Status   SARS  Coronavirus 2 by RT  PCR NEGATIVE NEGATIVE Final    Comment: (NOTE) SARS-CoV-2 target nucleic acids are NOT DETECTED.  The SARS-CoV-2 RNA is generally detectable in upper respiratory specimens during the acute phase of infection. The lowest concentration of SARS-CoV-2 viral copies this assay can detect is 138 copies/mL. A negative result does not preclude SARS-Cov-2 infection and should not be used as the sole basis for treatment or other patient management decisions. A negative result may occur with  improper specimen collection/handling, submission of specimen other than nasopharyngeal swab, presence of viral mutation(s) within the areas targeted by this assay, and inadequate number of viral copies(<138 copies/mL). A negative result must be combined with clinical observations, patient history, and epidemiological information. The expected result is Negative.  Fact Sheet for Patients:  BloggerCourse.com  Fact Sheet for Healthcare Providers:  SeriousBroker.it  This test is no t yet approved or cleared by the [place] FDA and  has been authorized for detection and/or diagnosis of SARS-CoV-2 by FDA under an Emergency Use Authorization (EUA). This EUA will remain  in effect (meaning this test can be used) for the duration of the COVID-19 declaration under Section 564(b)(1) of the Act, 21 U.S.C.section 360bbb-3(b)(1), unless the authorization is terminated  or revoked sooner.       Influenza A by PCR NEGATIVE NEGATIVE Final   Influenza B by PCR NEGATIVE NEGATIVE Final    Comment: (NOTE) The Xpert Xpress SARS-CoV-2/FLU/RSV plus assay is intended as an aid in the diagnosis of influenza from Nasopharyngeal swab specimens and should not be used as a sole basis for treatment. Nasal washings and aspirates are unacceptable for Xpert Xpress SARS-CoV-2/FLU/RSV testing.  Fact Sheet for  Patients: BloggerCourse.com  Fact Sheet for Healthcare Providers: SeriousBroker.it  This test is not yet approved or cleared by the [place] FDA and has been authorized for detection and/or diagnosis of SARS-CoV-2 by FDA under an Emergency Use Authorization (EUA). This EUA will remain in effect (meaning this test can be used) for the duration of the COVID-19 declaration under Section 564(b)(1) of the Act, 21 U.S.C. section 360bbb-3(b)(1), unless the authorization is terminated or revoked.     Resp Syncytial Virus by PCR NEGATIVE NEGATIVE Final    Comment: (NOTE) Fact Sheet for Patients: BloggerCourse.com  Fact Sheet for Healthcare Providers: SeriousBroker.it  This test is not yet approved or cleared by the [place] FDA and has been authorized for detection and/or diagnosis of SARS-CoV-2 by FDA under an Emergency Use Authorization (EUA). This EUA will remain in effect (meaning this test can be used) for the duration of the COVID-19 declaration under Section 564(b)(1) of the Act, 21 U.S.C. section 360bbb-3(b)(1), unless the authorization is terminated or revoked.  Performed at [facility], [address] [place], [place] [address]   Culture, blood (Routine X 2) w Reflex to ID Panel     Status: None (Preliminary result)   Collection Time: 08/22/23  2:37 PM   Specimen: BLOOD  Result Value Ref Range Status   Specimen Description BLOOD BLOOD LEFT HAND  Final   Special Requests   Final    BOTTLES DRAWN AEROBIC AND ANAEROBIC Blood Culture adequate volume   Culture   Final    NO GROWTH 2 DAYS Performed at [facility], [address] [place], [place] [address]    Report Status PENDING  Incomplete  Culture, blood (Routine X 2) w Reflex to ID Panel     Status: None (Preliminary result)   Collection Time: 08/22/23 11:39 PM  Specimen: BLOOD LEFT ARM   Result Value Ref Range Status   Specimen Description BLOOD LEFT ARM  Final   Special Requests   Final    BOTTLES DRAWN AEROBIC AND ANAEROBIC Blood Culture adequate volume   Culture   Final    NO GROWTH 2 DAYS Performed at [facility], [address], [place], [place] [address]    Report Status PENDING  Incomplete  MRSA Next Gen by PCR, Nasal     Status: None   Collection Time: 08/23/23 10:26 AM   Specimen: Nasal Mucosa; Nasal Swab  Result Value Ref Range Status   MRSA by PCR Next Gen NOT DETECTED NOT DETECTED Final    Comment: (NOTE) The GeneXpert MRSA Assay (FDA approved for NASAL specimens only), is one component of a comprehensive MRSA colonization surveillance program. It is not intended to diagnose MRSA infection nor to guide or monitor treatment for MRSA infections. Test performance is not FDA approved in patients less than 33 years old. Performed at [facility], [address], [place], [place] [address]       Radiology Studies last 3 days: DG Chest 2 View  Result Date: 08/22/2023 CLINICAL DATA:  Chest pain EXAM: CHEST - 2 VIEW COMPARISON:  08/13/2023 and older FINDINGS: Underinflation. Tiny effusions with some increasing adjacent opacities. Atelectasis favored over infiltrate recommend follow-up. No pneumothorax or edema. Normal cardiopericardial silhouette. IMPRESSION: Increasing lung base opacities with small effusions. Recommend follow-up Electronically Signed   By: Karen Kays M.D.   On: 08/22/2023 11:54          Sunnie Nielsen, DO [facility] 08/24/2023, 5:36 PM    Dictation software may have been used to generate the above note. Typos may occur and escape review in typed/dictated notes. Please contact Dr Lyn Hollingshead directly for clarity if needed.  Staff may message me via secure chat in Epic  but this may not receive an immediate response,  please page me for urgent matters!  If 7PM-7AM, please contact night  coverage [email or web address]

## 2023-08-24 NOTE — Plan of Care (Signed)
  Problem: Fluid Volume: Goal: Hemodynamic stability will improve Outcome: Progressing   Problem: Education: Goal: Individualized Educational Video(s) Outcome: Progressing   Problem: Cardiac: Goal: Ability to maintain an adequate cardiac output will improve Outcome: Progressing   Problem: Nutritional: Goal: Maintenance of adequate weight for body size and type will improve Outcome: Progressing

## 2023-08-24 NOTE — Progress Notes (Signed)
[facility]  ROUNDING NOTE   Subjective:   Emma Thomas  is a 27 y.o female with past medical history of diabetes, anemia, and diabetes type 1, and end stage renal disease on home hemodialysis dialysis. Patient presents to the ED with weakness, shortness of breath and chest pain. She has been admitted for ESRD on hemodialysis (HCC) [N18.6, Z99.2] Severe sepsis (HCC) [A41.9, R65.20] Failure of outpatient treatment [Z78.9] Community acquired pneumonia, unspecified laterality [J18.9]  Patient is known to our practice and completes home hemodialysis supervised by Dr. Cherylann Ratel.  Continues to have nausea  Has not attempted oral intake Mild shortness of breath  Dialysis scheduled for later today  Objective:  Vital signs in last 24 hours:  Temp:  [98.3 F (36.8 C)-99.3 F (37.4 C)] 98.6 F (37 C) (10/08 1214) Pulse Rate:  [82-92] 82 (10/08 1214) Resp:  [16-22] 21 (10/08 1214) BP: (110-157)/(67-85) 130/77 (10/08 1214) SpO2:  [93 %-99 %] 95 % (10/08 1214)  Weight change:  Filed Weights   08/22/23 1117 08/22/23 1811 08/22/23 2250  Weight: 72.6 kg 73.1 kg 71.5 kg    Intake/Output: I/O last 3 completed shifts: In: 247.1 [IV Piggyback:247.1] Out: 1500 [Other:1500]   Intake/Output this shift:  No intake/output data recorded.  Physical Exam: General: Ill appearing  Head: Normocephalic, atraumatic. Moist oral mucosal membranes  Eyes: Anicteric  Lungs:  Clear to auscultation  Heart: Regular rate and rhythm  Abdomen:  Soft, nontender  Extremities:  No peripheral edema.  Neurologic: Nonfocal, moving all four extremities  Skin: No lesions  Access: Rt AVF    Basic Metabolic Panel: Recent Labs  Lab 08/22/23 1120 08/23/23 0553 08/24/23 0531  NA 130* 132* 135  K 3.8 4.3 3.3*  CL 93* 92* 95*  CO2 15* 8* 21*  GLUCOSE 172* 353* 197*  BUN 71* 39* 44*  CREATININE 13.83* 7.48* 8.71*  CALCIUM 7.9* 7.9* 8.2*    Liver Function Tests: Recent Labs  Lab  08/23/23 0553  AST 32  ALT 22  ALKPHOS 89  BILITOT 2.7*  PROT 7.4  ALBUMIN 2.8*   No results for input(s): "LIPASE", "AMYLASE" in the last 168 hours. No results for input(s): "AMMONIA" in the last 168 hours.  CBC: Recent Labs  Lab 08/22/23 1120 08/23/23 0553 08/24/23 0531  WBC 17.0* 18.7* 14.4*  HGB 7.3* 7.0* 7.2*  HCT 20.9* 20.8* 21.6*  MCV 85.3 89.7 87.8  PLT 523* 417* 458*    Cardiac Enzymes: No results for input(s): "CKTOTAL", "CKMB", "CKMBINDEX", "TROPONINI" in the last 168 hours.  BNP: Invalid input(s): "POCBNP"  CBG: Recent Labs  Lab 08/23/23 1954 08/23/23 2343 08/24/23 0325 08/24/23 0813 08/24/23 1215  GLUCAP 285* 271* 225* 204* 218*    Microbiology: Results for orders placed or performed during the hospital encounter of 08/22/23  Resp panel by RT-PCR (RSV, Flu A&B, Covid) Anterior Nasal Swab     Status: None   Collection Time: 08/22/23 12:38 PM   Specimen: Anterior Nasal Swab  Result Value Ref Range Status   SARS Coronavirus 2 by RT PCR NEGATIVE NEGATIVE Final    Comment: (NOTE) SARS-CoV-2 target nucleic acids are NOT DETECTED.  The SARS-CoV-2 RNA is generally detectable in upper respiratory specimens during the acute phase of infection. The lowest concentration of SARS-CoV-2 viral copies this assay can detect is 138 copies/mL. A negative result does not preclude SARS-Cov-2 infection and should not be used as the sole basis for treatment or other patient management decisions. A negative result may occur  with  improper specimen collection/handling, submission of specimen other than nasopharyngeal swab, presence of viral mutation(s) within the areas targeted by this assay, and inadequate number of viral copies(<138 copies/mL). A negative result must be combined with clinical observations, patient history, and epidemiological information. The expected result is Negative.  Fact Sheet for Patients:   BloggerCourse.com  Fact Sheet for Healthcare Providers:  SeriousBroker.it  This test is no t yet approved or cleared by the [place] FDA and  has been authorized for detection and/or diagnosis of SARS-CoV-2 by FDA under an Emergency Use Authorization (EUA). This EUA will remain  in effect (meaning this test can be used) for the duration of the COVID-19 declaration under Section 564(b)(1) of the Act, 21 U.S.C.section 360bbb-3(b)(1), unless the authorization is terminated  or revoked sooner.       Influenza A by PCR NEGATIVE NEGATIVE Final   Influenza B by PCR NEGATIVE NEGATIVE Final    Comment: (NOTE) The Xpert Xpress SARS-CoV-2/FLU/RSV plus assay is intended as an aid in the diagnosis of influenza from Nasopharyngeal swab specimens and should not be used as a sole basis for treatment. Nasal washings and aspirates are unacceptable for Xpert Xpress SARS-CoV-2/FLU/RSV testing.  Fact Sheet for Patients: BloggerCourse.com  Fact Sheet for Healthcare Providers: SeriousBroker.it  This test is not yet approved or cleared by the [place] FDA and has been authorized for detection and/or diagnosis of SARS-CoV-2 by FDA under an Emergency Use Authorization (EUA). This EUA will remain in effect (meaning this test can be used) for the duration of the COVID-19 declaration under Section 564(b)(1) of the Act, 21 U.S.C. section 360bbb-3(b)(1), unless the authorization is terminated or revoked.     Resp Syncytial Virus by PCR NEGATIVE NEGATIVE Final    Comment: (NOTE) Fact Sheet for Patients: BloggerCourse.com  Fact Sheet for Healthcare Providers: SeriousBroker.it  This test is not yet approved or cleared by the [place] FDA and has been authorized for detection and/or diagnosis of SARS-CoV-2 by FDA under an Emergency Use  Authorization (EUA). This EUA will remain in effect (meaning this test can be used) for the duration of the COVID-19 declaration under Section 564(b)(1) of the Act, 21 U.S.C. section 360bbb-3(b)(1), unless the authorization is terminated or revoked.  Performed at [facility], [address] [place], [place] [address]   Culture, blood (Routine X 2) w Reflex to ID Panel     Status: None (Preliminary result)   Collection Time: 08/22/23  2:37 PM   Specimen: BLOOD  Result Value Ref Range Status   Specimen Description BLOOD BLOOD LEFT HAND  Final   Special Requests   Final    BOTTLES DRAWN AEROBIC AND ANAEROBIC Blood Culture adequate volume   Culture   Final    NO GROWTH 2 DAYS Performed at [facility], [address], [place], [place] [address]    Report Status PENDING  Incomplete  Culture, blood (Routine X 2) w Reflex to ID Panel     Status: None (Preliminary result)   Collection Time: 08/22/23 11:39 PM   Specimen: BLOOD LEFT ARM  Result Value Ref Range Status   Specimen Description BLOOD LEFT ARM  Final   Special Requests   Final    BOTTLES DRAWN AEROBIC AND ANAEROBIC Blood Culture adequate volume   Culture   Final    NO GROWTH 2 DAYS Performed at [facility], [address], [place], [place] [address]    Report Status PENDING  Incomplete  MRSA Next  Gen by PCR, Nasal     Status: None   Collection Time: 08/23/23 10:26 AM   Specimen: Nasal Mucosa; Nasal Swab  Result Value Ref Range Status   MRSA by PCR Next Gen NOT DETECTED NOT DETECTED Final    Comment: (NOTE) The GeneXpert MRSA Assay (FDA approved for NASAL specimens only), is one component of a comprehensive MRSA colonization surveillance program. It is not intended to diagnose MRSA infection nor to guide or monitor treatment for MRSA infections. Test performance is not FDA approved in patients less than 9 years old. Performed at [facility], [address] [place], [place]  [address]     Coagulation Studies: No results for input(s): "LABPROT", "INR" in the last 72 hours.  Urinalysis: No results for input(s): "COLORURINE", "LABSPEC", "PHURINE", "GLUCOSEU", "HGBUR", "BILIRUBINUR", "KETONESUR", "PROTEINUR", "UROBILINOGEN", "NITRITE", "LEUKOCYTESUR" in the last 72 hours.  Invalid input(s): "APPERANCEUR"    Imaging: No results found.   Medications:    promethazine (PHENERGAN) injection (IM or IVPB) 12.5 mg (08/24/23 0158)    amLODipine  10 mg Oral Daily   atorvastatin  80 mg Oral QHS   bumetanide  1 mg Oral BID   heparin  5,000 Units Subcutaneous Q8H   insulin aspart  0-5 Units Subcutaneous QHS   insulin aspart  0-9 Units Subcutaneous TID WC   insulin glargine-yfgn  14 Units Subcutaneous QHS   metoprolol tartrate  25 mg Oral BID   montelukast  10 mg Oral QHS   multivitamin  1 tablet Oral QHS   norethindrone-ethinyl estradiol  1 tablet Oral Daily   Vitamin D (Ergocalciferol)  50,000 Units Oral Q7 days   acetaminophen **OR** acetaminophen, benzonatate, cyclobenzaprine, guaiFENesin-codeine, hydrALAZINE, lidocaine, metoCLOPramide (REGLAN) injection, ondansetron **OR** ondansetron (ZOFRAN) IV, promethazine (PHENERGAN) injection (IM or IVPB), senna-docusate, SUMAtriptan  Assessment/ Plan:  [name] is a 27 y.o.  female with past medical history of diabetes, anemia, and diabetes type 1, and end stage renal disease on home hemodialysis dialysis. Patient presents to the ED with weakness, shortness of breath and chest pain. She has been admitted for ESRD on hemodialysis (HCC) [N18.6, Z99.2] Severe sepsis (HCC) [A41.9, R65.20] Failure of outpatient treatment [Z78.9] Community acquired pneumonia, unspecified laterality [J18.9]  [facility]  End stage renal disease on home hemodialysis. Next treatment later today  2. Anemia of chronic kidney disease Lab Results  Component Value Date   HGB 7.2 (L) 08/24/2023    Hgb below desired range.  Will order EPO with dialysis. Suggest avoiding blood products as patient is renal transplant candidate.   3. Secondary Hyperparathyroidism: with outpatient labs: none available.   Lab Results  Component Value Date   CALCIUM 8.2 (L) 08/24/2023   CAION 0.97 (L) 01/08/2023   PHOS 5.6 (H) 07/10/2023    Calcium and phosphorus stable. Will continue to monitor.   4. 4. 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 Home regimen includes humalog. Most recent hemoglobin A1c is 8.5 on 06/18/23.    LOS: 2   10/8/202412:56 PM

## 2023-08-24 NOTE — Inpatient Diabetes Management (Signed)
Inpatient Diabetes Program Recommendations  AACE/ADA: New Consensus Statement on Inpatient Glycemic Control (2015)  Target Ranges:  Prepandial:   less than 140 mg/dL      Peak postprandial:   less than 180 mg/dL (1-2 hours)      Critically ill patients:  140 - 180 mg/dL   Lab Results  Component Value Date   GLUCAP 204 (H) 08/24/2023   HGBA1C 8.5 (H) 06/18/2023    Latest Reference Range & Units 08/23/23 07:34 08/23/23 11:28 08/23/23 14:17 08/23/23 15:46 08/23/23 19:54 08/23/23 23:43 08/24/23 03:25 08/24/23 08:13  Glucose-Capillary 70 - 99 mg/dL 409 (H) 811 (H) 914 (H) 243 (H) 285 (H) 271 (H) 225 (H) 204 (H)  (H): Data is abnormally high   History: Type 1 Diabetes, ESRD   Home DM Meds: Tandem Insulin Pump  Current orders for Inpatient glycemic control: Semglee 8 units hs, Novolog 0-9 units q 4 hrs.  Inpatient Diabetes Program Recommendations:   Spoke with patient regarding her insulin pump supplies and she has no one to bring her insulin pump supplies from home. Patient also said she is not eating due to she didn't feel well enough to eat.  Please consider: -Increase Semglee to 16 units daily -Add Novolog meal coverage when eating   Per [facility] "Normal         Basal   Insulin Sensitivity   Carb Ratio 12a  0.5      65                            8 3a    0.5      65                            8 6a    0.6      50                            8 11a   0.6     50                            8 4p     0.8     40                            7 9p     0.6     55                             8 Total basal: 17.6 units/24 hours  HIGH         Basal    Insulin Sensitivity   Carb Ratio 12a  0.8        40                           7 8a    0.9        35                           6 4p    1.1        30  6 9p    0.9        35                           7  Total basal: 21.8 units/24 hours"  Thank you, Billy Fischer. Bertrum Helmstetter, RN, MSN, CDE  Diabetes Coordinator Inpatient  Glycemic Control Team Team Pager [phone] (8am-5pm) 08/24/2023 9:19 AM

## 2023-08-25 DIAGNOSIS — R0781 Pleurodynia: Secondary | ICD-10-CM

## 2023-08-25 DIAGNOSIS — R1013 Epigastric pain: Secondary | ICD-10-CM | POA: Diagnosis not present

## 2023-08-25 DIAGNOSIS — J189 Pneumonia, unspecified organism: Secondary | ICD-10-CM

## 2023-08-25 DIAGNOSIS — A419 Sepsis, unspecified organism: Secondary | ICD-10-CM | POA: Diagnosis not present

## 2023-08-25 DIAGNOSIS — Z992 Dependence on renal dialysis: Secondary | ICD-10-CM

## 2023-08-25 DIAGNOSIS — E785 Hyperlipidemia, unspecified: Secondary | ICD-10-CM

## 2023-08-25 DIAGNOSIS — I10 Essential (primary) hypertension: Secondary | ICD-10-CM

## 2023-08-25 DIAGNOSIS — N186 End stage renal disease: Secondary | ICD-10-CM

## 2023-08-25 DIAGNOSIS — D631 Anemia in chronic kidney disease: Secondary | ICD-10-CM

## 2023-08-25 LAB — CBC
HCT: 25 % — ABNORMAL LOW (ref 36.0–46.0)
Hemoglobin: 8.5 g/dL — ABNORMAL LOW (ref 12.0–15.0)
MCH: 29.2 pg (ref 26.0–34.0)
MCHC: 34 g/dL (ref 30.0–36.0)
MCV: 85.9 fL (ref 80.0–100.0)
Platelets: 481 10*3/uL — ABNORMAL HIGH (ref 150–400)
RBC: 2.91 MIL/uL — ABNORMAL LOW (ref 3.87–5.11)
RDW: 13.2 % (ref 11.5–15.5)
WBC: 9.4 10*3/uL (ref 4.0–10.5)
nRBC: 0 % (ref 0.0–0.2)

## 2023-08-25 LAB — BASIC METABOLIC PANEL
Anion gap: 17 — ABNORMAL HIGH (ref 5–15)
BUN: 20 mg/dL (ref 6–20)
CO2: 24 mmol/L (ref 22–32)
Calcium: 8 mg/dL — ABNORMAL LOW (ref 8.9–10.3)
Chloride: 94 mmol/L — ABNORMAL LOW (ref 98–111)
Creatinine, Ser: 5.17 mg/dL — ABNORMAL HIGH (ref 0.44–1.00)
GFR, Estimated: 11 mL/min — ABNORMAL LOW (ref >60)
Glucose, Bld: 117 mg/dL — ABNORMAL HIGH (ref 70–99)
Potassium: 3.1 mmol/L — ABNORMAL LOW (ref 3.5–5.1)
Sodium: 135 mmol/L (ref 135–145)

## 2023-08-25 LAB — GLUCOSE, CAPILLARY
Glucose-Capillary: 120 mg/dL — ABNORMAL HIGH (ref 70–99)
Glucose-Capillary: 114 mg/dL — ABNORMAL HIGH (ref 70–99)
Glucose-Capillary: 121 mg/dL — ABNORMAL HIGH (ref 70–99)
Glucose-Capillary: 220 mg/dL — ABNORMAL HIGH (ref 70–99)

## 2023-08-25 LAB — HEPATITIS B SURFACE ANTIBODY, QUANTITATIVE: Hep B S AB Quant (Post): 86.9 m[IU]/mL

## 2023-08-25 MED ORDER — AMOXICILLIN-POT CLAVULANATE 500-125 MG PO TABS
1.0000 | ORAL_TABLET | Freq: Two times a day (BID) | ORAL | Status: DC
  Administered 2023-08-25 – 2023-08-26 (×2): 1 via ORAL
  Filled 2023-08-25 (×2): qty 1

## 2023-08-25 MED ORDER — METHYLPREDNISOLONE SODIUM SUCC 40 MG IJ SOLR
40.0000 mg | Freq: Once | INTRAMUSCULAR | Status: AC
  Administered 2023-08-25: 40 mg via INTRAVENOUS
  Filled 2023-08-25: qty 1

## 2023-08-25 MED ORDER — NITROGLYCERIN 0.4 MG SL SUBL: 0.4000 mg | SUBLINGUAL_TABLET | SUBLINGUAL | Status: DC | PRN

## 2023-08-25 MED ORDER — ONDANSETRON HCL 4 MG/2ML IJ SOLN
4.0000 mg | Freq: Four times a day (QID) | INTRAMUSCULAR | Status: DC
  Administered 2023-08-25 – 2023-08-26 (×5): 4 mg via INTRAVENOUS
  Filled 2023-08-25 (×5): qty 2

## 2023-08-25 MED ORDER — PANTOPRAZOLE SODIUM 40 MG IV SOLR
40.0000 mg | Freq: Two times a day (BID) | INTRAVENOUS | Status: DC
  Administered 2023-08-25 – 2023-08-26 (×3): 40 mg via INTRAVENOUS
  Filled 2023-08-25 (×3): qty 10

## 2023-08-25 NOTE — Assessment & Plan Note (Signed)
Last hemoglobin 8.5

## 2023-08-25 NOTE — Assessment & Plan Note (Signed)
Patient will go back on her insulin pump this evening.  She received Semglee insulin last night.

## 2023-08-25 NOTE — Progress Notes (Signed)
[facility]  ROUNDING NOTE   Subjective:   Emma Thomas  is a 27 y.o female with past medical history of diabetes, anemia, and diabetes type 1, and end stage renal disease on home hemodialysis dialysis. Patient presents to the ED with weakness, shortness of breath and chest pain. She has been admitted for ESRD on hemodialysis (HCC) [N18.6, Z99.2] Severe sepsis (HCC) [A41.9, R65.20] Failure of outpatient treatment [Z78.9] Community acquired pneumonia, unspecified laterality [J18.9]  Patient is known to our practice and completes home hemodialysis supervised by Dr. Cherylann Ratel.  Patient continues to have nausea Access infiltrated during dialysis, denies pain, no edema  Objective:  Vital signs in last 24 hours:  Temp:  [98.1 F (36.7 C)-99.5 F (37.5 C)] 99.5 F (37.5 C) (10/09 1155) Pulse Rate:  [80-89] 85 (10/09 1155) Resp:  [10-24] 16 (10/09 1155) BP: (112-153)/(72-97) 144/95 (10/09 1155) SpO2:  [92 %-99 %] 97 % (10/09 1155) Weight:  [67.3 kg-68.2 kg] 67.3 kg (10/08 1835)  Weight change:  Filed Weights   08/22/23 2250 08/24/23 1451 08/24/23 1835  Weight: 71.5 kg 68.2 kg 67.3 kg    Intake/Output: I/O last 3 completed shifts: In: 157.3 [IV Piggyback:157.3] Out: 900 [Other:900]   Intake/Output this shift:  No intake/output data recorded.  Physical Exam: General: Ill appearing  Head: Normocephalic, atraumatic. Moist oral mucosal membranes  Eyes: Anicteric  Lungs:  Clear to auscultation  Heart: Regular rate and rhythm  Abdomen:  Soft, nontender  Extremities:  No peripheral edema.  Neurologic: Nonfocal, moving all four extremities  Skin: No lesions  Access: Rt AVF    Basic Metabolic Panel: Recent Labs  Lab 08/22/23 1120 08/23/23 0553 08/24/23 0531 08/25/23 0529  NA 130* 132* 135 135  K 3.8 4.3 3.3* 3.1*  CL 93* 92* 95* 94*  CO2 15* 8* 21* 24  GLUCOSE 172* 353* 197* 117*  BUN 71* 39* 44* 20  CREATININE 13.83* 7.48* 8.71* 5.17*  CALCIUM 7.9*  7.9* 8.2* 8.0*    Liver Function Tests: Recent Labs  Lab 08/23/23 0553  AST 32  ALT 22  ALKPHOS 89  BILITOT 2.7*  PROT 7.4  ALBUMIN 2.8*   No results for input(s): "LIPASE", "AMYLASE" in the last 168 hours. No results for input(s): "AMMONIA" in the last 168 hours.  CBC: Recent Labs  Lab 08/22/23 1120 08/23/23 0553 08/24/23 0531 08/25/23 0529  WBC 17.0* 18.7* 14.4* 9.4  HGB 7.3* 7.0* 7.2* 8.5*  HCT 20.9* 20.8* 21.6* 25.0*  MCV 85.3 89.7 87.8 85.9  PLT 523* 417* 458* 481*    Cardiac Enzymes: No results for input(s): "CKTOTAL", "CKMB", "CKMBINDEX", "TROPONINI" in the last 168 hours.  BNP: Invalid input(s): "POCBNP"  CBG: Recent Labs  Lab 08/24/23 1850 08/24/23 2016 08/25/23 0341 08/25/23 0732 08/25/23 1156  GLUCAP 129* 113* 120* 114* 121*    Microbiology: Results for orders placed or performed during the hospital encounter of 08/22/23  Resp panel by RT-PCR (RSV, Flu A&B, Covid) Anterior Nasal Swab     Status: None   Collection Time: 08/22/23 12:38 PM   Specimen: Anterior Nasal Swab  Result Value Ref Range Status   SARS Coronavirus 2 by RT PCR NEGATIVE NEGATIVE Final    Comment: (NOTE) SARS-CoV-2 target nucleic acids are NOT DETECTED.  The SARS-CoV-2 RNA is generally detectable in upper respiratory specimens during the acute phase of infection. The lowest concentration of SARS-CoV-2 viral copies this assay can detect is 138 copies/mL. A negative result does not preclude SARS-Cov-2 infection and should  not be used as the sole basis for treatment or other patient management decisions. A negative result may occur with  improper specimen collection/handling, submission of specimen other than nasopharyngeal swab, presence of viral mutation(s) within the areas targeted by this assay, and inadequate number of viral copies(<138 copies/mL). A negative result must be combined with clinical observations, patient history, and epidemiological information. The  expected result is Negative.  Fact Sheet for Patients:  BloggerCourse.com  Fact Sheet for Healthcare Providers:  SeriousBroker.it  This test is no t yet approved or cleared by the [place] FDA and  has been authorized for detection and/or diagnosis of SARS-CoV-2 by FDA under an Emergency Use Authorization (EUA). This EUA will remain  in effect (meaning this test can be used) for the duration of the COVID-19 declaration under Section 564(b)(1) of the Act, 21 U.S.C.section 360bbb-3(b)(1), unless the authorization is terminated  or revoked sooner.       Influenza A by PCR NEGATIVE NEGATIVE Final   Influenza B by PCR NEGATIVE NEGATIVE Final    Comment: (NOTE) The Xpert Xpress SARS-CoV-2/FLU/RSV plus assay is intended as an aid in the diagnosis of influenza from Nasopharyngeal swab specimens and should not be used as a sole basis for treatment. Nasal washings and aspirates are unacceptable for Xpert Xpress SARS-CoV-2/FLU/RSV testing.  Fact Sheet for Patients: BloggerCourse.com  Fact Sheet for Healthcare Providers: SeriousBroker.it  This test is not yet approved or cleared by the [place] FDA and has been authorized for detection and/or diagnosis of SARS-CoV-2 by FDA under an Emergency Use Authorization (EUA). This EUA will remain in effect (meaning this test can be used) for the duration of the COVID-19 declaration under Section 564(b)(1) of the Act, 21 U.S.C. section 360bbb-3(b)(1), unless the authorization is terminated or revoked.     Resp Syncytial Virus by PCR NEGATIVE NEGATIVE Final    Comment: (NOTE) Fact Sheet for Patients: BloggerCourse.com  Fact Sheet for Healthcare Providers: SeriousBroker.it  This test is not yet approved or cleared by the [place] FDA and has been authorized for detection and/or  diagnosis of SARS-CoV-2 by FDA under an Emergency Use Authorization (EUA). This EUA will remain in effect (meaning this test can be used) for the duration of the COVID-19 declaration under Section 564(b)(1) of the Act, 21 U.S.C. section 360bbb-3(b)(1), unless the authorization is terminated or revoked.  Performed at [facility], [address] [place], [place] [address]   Culture, blood (Routine X 2) w Reflex to ID Panel     Status: None (Preliminary result)   Collection Time: 08/22/23  2:37 PM   Specimen: BLOOD  Result Value Ref Range Status   Specimen Description BLOOD BLOOD LEFT HAND  Final   Special Requests   Final    BOTTLES DRAWN AEROBIC AND ANAEROBIC Blood Culture adequate volume   Culture   Final    NO GROWTH 2 DAYS Performed at [facility], [address], [place], [place] [address]    Report Status PENDING  Incomplete  Culture, blood (Routine X 2) w Reflex to ID Panel     Status: None (Preliminary result)   Collection Time: 08/22/23 11:39 PM   Specimen: BLOOD LEFT ARM  Result Value Ref Range Status   Specimen Description BLOOD LEFT ARM  Final   Special Requests   Final    BOTTLES DRAWN AEROBIC AND ANAEROBIC Blood Culture adequate volume   Culture   Final    NO GROWTH 2 DAYS Performed at [facility], [address] [place], [place] [address]    Report Status PENDING  Incomplete  MRSA Next Gen by PCR, Nasal     Status: None   Collection Time: 08/23/23 10:26 AM   Specimen: Nasal Mucosa; Nasal Swab  Result Value Ref Range Status   MRSA by PCR Next Gen NOT DETECTED NOT DETECTED Final    Comment: (NOTE) The GeneXpert MRSA Assay (FDA approved for NASAL specimens only), is one component of a comprehensive MRSA colonization surveillance program. It is not intended to diagnose MRSA infection nor to guide or monitor treatment for MRSA infections. Test performance is not FDA approved in patients less than 76 years old. Performed at  [facility], [address] [place], [place] [address]     Coagulation Studies: No results for input(s): "LABPROT", "INR" in the last 72 hours.  Urinalysis: No results for input(s): "COLORURINE", "LABSPEC", "PHURINE", "GLUCOSEU", "HGBUR", "BILIRUBINUR", "KETONESUR", "PROTEINUR", "UROBILINOGEN", "NITRITE", "LEUKOCYTESUR" in the last 72 hours.  Invalid input(s): "APPERANCEUR"    Imaging: No results found.   Medications:    promethazine (PHENERGAN) injection (IM or IVPB) 12.5 mg (08/24/23 2141)    amLODipine  10 mg Oral Daily   amoxicillin-clavulanate  1 tablet Oral Q12H   atorvastatin  80 mg Oral QHS   azithromycin  500 mg Oral Daily   bumetanide  1 mg Oral BID   epoetin (EPOGEN/PROCRIT) injection  10,000 Units Intravenous Q T,Th,Sa-HD   heparin  5,000 Units Subcutaneous Q8H   insulin aspart  0-5 Units Subcutaneous QHS   insulin aspart  0-9 Units Subcutaneous TID WC   insulin glargine-yfgn  14 Units Subcutaneous QHS   metoprolol tartrate  25 mg Oral BID   montelukast  10 mg Oral QHS   multivitamin  1 tablet Oral QHS   norethindrone-ethinyl estradiol  1 tablet Oral Daily   ondansetron (ZOFRAN) IV  4 mg Intravenous Q6H   Vitamin D (Ergocalciferol)  50,000 Units Oral Q7 days   benzonatate, guaiFENesin-codeine, hydrALAZINE, lidocaine, metoCLOPramide (REGLAN) injection, nitroGLYCERIN, oxyCODONE-acetaminophen, promethazine (PHENERGAN) injection (IM or IVPB), senna-docusate, SUMAtriptan  Assessment/ Plan:  [name] is a 27 y.o.  female with past medical history of diabetes, anemia, and diabetes type 1, and end stage renal disease on home hemodialysis dialysis. Patient presents to the ED with weakness, shortness of breath and chest pain. She has been admitted for ESRD on hemodialysis (HCC) [N18.6, Z99.2] Severe sepsis (HCC) [A41.9, R65.20] Failure of outpatient treatment [Z78.9] Community acquired pneumonia, unspecified laterality [J18.9]  [facility]  End stage renal disease on home hemodialysis. Dialysis received yesterday, UF achieved. Access infiltrated at end of treatment. Will attempt dialysis on Thursday. Patient encouraged to improve oral intake, BRAT diet. Recommend short term scheduled antiemetic.   2. Anemia of chronic kidney disease Lab Results  Component Value Date   HGB 8.5 (L) 08/25/2023    Hgb not at goal. EPO with dialysis. Suggest avoiding blood products as patient is renal transplant candidate.   3. Secondary Hyperparathyroidism: with outpatient labs: none available.   Lab Results  Component Value Date   CALCIUM 8.0 (L) 08/25/2023   CAION 0.97 (L) 01/08/2023   PHOS 5.6 (H) 07/10/2023    Will continue to monitor bone minerals. Binders held due to nausea and lack of oral intake.   4. 4. 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 Home regimen includes humalog. Most recent hemoglobin A1c is 8.5 on 06/18/23.  LOS: 3   10/9/202412:14 PM

## 2023-08-25 NOTE — Assessment & Plan Note (Signed)
Likely secondary to pneumonia.  IV Solu-Medrol.

## 2023-08-25 NOTE — Progress Notes (Signed)
Progress Note   Patient: Emma Thomas KVQ:[id number] DOB: 1996/03/09 DOA: 08/22/2023     3 DOS: the patient was seen and examined on 08/25/2023   Brief hospital course: HPI: [name] is a 27 year old female with history of end-stage renal disease on hemodialysis at home, hypertension, hyperlipidemia, migraine headaches, history of osteomyelitis of the left foot, insulin-dependent diabetes mellitus type 1, who presents to emergency department for chief concerns of  shortness of breath, chest pain.  Hospital course / significant events:  10/06: Chest x-ray 2 view: Was read as increasing lung base opacity with small effusions. Admitted to hospitalist service for sepsis d/t CAP.  10/07: BCx pending, pt stable - Hgb below optimal range plan EPO with dialysis - avoiding blood products as patient is renal transplant candidate.  10/08: BCx NGx2d. Persistent nausea, unable to tolerate po, have d/c IV abx which hopefully will help, gave single dose IV pain meds and advised NO FURTHER pain meds IV. Plan next dialysis treatment later today. If tolerating po can hopefully d/c home tomorrow, HD today 10/9.  Patient having nausea vomiting and chest pain.  Not feeling well.  Pain also in her back.        Assessment and Plan: * Severe sepsis (HCC) Present on admission.  Markedly elevated leukocytosis, creased respiration rate, source of pneumonia.  Patient on Augmentin and Zithromax.  CAP (community acquired pneumonia) Patient now on Augmentin and Zithromax.  Pleuritic chest pain Likely secondary to pneumonia.  IV Solu-Medrol.  Epigastric pain With nausea vomiting.  Add IV Protonix.  On Reglan.  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) Continue Semglee insulin 14 units daily and sliding scale.  ESRD on dialysis [facility]) Dialysis as per nephrology.  Does dialysis at home.  Hypertension Continue Norvasc and metoprolol.  Anemia due to chronic kidney  disease Last hemoglobin 8.5  High anion gap metabolic acidosis Secondary to end-stage renal disease on hemodialysis   Dyslipidemia Atorvastatin 80 mg daily        Subjective: Patient had nausea vomiting this morning and pain in her chest worse with pressing.  Patient admitted with pneumonia.  Physical Exam: Vitals:   08/24/23 2348 08/25/23 0339 08/25/23 0731 08/25/23 1155  BP: 125/84 (!) 141/93 (!) 151/95 (!) 144/95  Pulse: 89 89 87 85  Resp: 18 18 16 16   Temp: 98.7 F (37.1 C) 98.3 F (36.8 C) 98.1 F (36.7 C) 99.5 F (37.5 C)  TempSrc:   Oral   SpO2: 99% 94% 96% 97%  Weight:      Height:       Physical Exam HENT:     Head: Normocephalic.     Mouth/Throat:     Pharynx: No oropharyngeal exudate.  Eyes:     General: Lids are normal.     Conjunctiva/sclera: Conjunctivae normal.  Cardiovascular:     Rate and Rhythm: Normal rate and regular rhythm.     Heart sounds: Normal heart sounds, S1 normal and S2 normal.  Pulmonary:     Breath sounds: No decreased breath sounds, wheezing, rhonchi or rales.  Chest:     Comments: Pain to palpation upper chest left side near sternum. Abdominal:     Palpations: Abdomen is soft.     Tenderness: There is abdominal tenderness in the epigastric area.  Musculoskeletal:     Right lower leg: No swelling.     Left lower leg: No swelling.  Skin:    General: Skin is warm.  Findings: No rash.  Neurological:     Mental Status: She is alert and oriented to person, place, and time.     Data Reviewed: EKG reviewed by me nonspecific lateral T wave changes. Hemoglobin 8.5, white blood cell count 9.4, platelet count 481, creatinine 5.17, potassium 3.1  Family Communication: Spoke with husband on the phone  Disposition: Status is: Inpatient Remains inpatient appropriate because:   Planned Discharge Destination: Home    Time spent: 28 minutes  Author: Alford Highland, MD 08/25/2023 3:04 PM  For on call review  [email or web address].

## 2023-08-25 NOTE — Assessment & Plan Note (Signed)
With nausea vomiting.  Improved with IV Protonix will prescribe Protonix upon going home.

## 2023-08-25 NOTE — Plan of Care (Signed)
Problem: Fluid Volume: Goal: Hemodynamic stability will improve Outcome: Progressing   Problem: Clinical Measurements: Goal: Diagnostic test results will improve Outcome: Progressing Goal: Signs and symptoms of infection will decrease Outcome: Progressing   Problem: Respiratory: Goal: Ability to maintain adequate ventilation will improve Outcome: Progressing   Problem: Education: Goal: Ability to describe self-care measures that may prevent or decrease complications (Diabetes Survival Skills Education) will improve Outcome: Progressing Goal: Individualized Educational Video(s) Outcome: Progressing   Problem: Cardiac: Goal: Ability to maintain an adequate cardiac output will improve Outcome: Progressing   Problem: Health Behavior/Discharge Planning: Goal: Ability to identify and utilize available resources and services will improve Outcome: Progressing Goal: Ability to manage health-related needs will improve Outcome: Progressing   Problem: Fluid Volume: Goal: Ability to achieve a balanced intake and output will improve Outcome: Progressing   Problem: Metabolic: Goal: Ability to maintain appropriate glucose levels will improve Outcome: Progressing   Problem: Nutritional: Goal: Maintenance of adequate nutrition will improve Outcome: Progressing Goal: Maintenance of adequate weight for body size and type will improve Outcome: Progressing   Problem: Respiratory: Goal: Will regain and/or maintain adequate ventilation Outcome: Progressing   Problem: Urinary Elimination: Goal: Ability to achieve and maintain adequate renal perfusion and functioning will improve Outcome: Progressing   Problem: Education: Goal: Ability to describe self-care measures that may prevent or decrease complications (Diabetes Survival Skills Education) will improve Outcome: Progressing Goal: Individualized Educational Video(s) Outcome: Progressing   Problem: Coping: Goal: Ability to adjust  to condition or change in health will improve Outcome: Progressing   Problem: Fluid Volume: Goal: Ability to maintain a balanced intake and output will improve Outcome: Progressing   Problem: Health Behavior/Discharge Planning: Goal: Ability to identify and utilize available resources and services will improve Outcome: Progressing Goal: Ability to manage health-related needs will improve Outcome: Progressing   Problem: Metabolic: Goal: Ability to maintain appropriate glucose levels will improve Outcome: Progressing   Problem: Nutritional: Goal: Maintenance of adequate nutrition will improve Outcome: Progressing Goal: Progress toward achieving an optimal weight will improve Outcome: Progressing   Problem: Skin Integrity: Goal: Risk for impaired skin integrity will decrease Outcome: Progressing   Problem: Tissue Perfusion: Goal: Adequacy of tissue perfusion will improve Outcome: Progressing   Problem: Education: Goal: Knowledge of General Education information will improve Description: Including pain rating scale, medication(s)/side effects and non-pharmacologic comfort measures Outcome: Progressing   Problem: Health Behavior/Discharge Planning: Goal: Ability to manage health-related needs will improve Outcome: Progressing   Problem: Clinical Measurements: Goal: Ability to maintain clinical measurements within normal limits will improve Outcome: Progressing Goal: Will remain free from infection Outcome: Progressing Goal: Diagnostic test results will improve Outcome: Progressing Goal: Respiratory complications will improve Outcome: Progressing Goal: Cardiovascular complication will be avoided Outcome: Progressing   Problem: Activity: Goal: Risk for activity intolerance will decrease Outcome: Progressing   Problem: Nutrition: Goal: Adequate nutrition will be maintained Outcome: Progressing   Problem: Coping: Goal: Level of anxiety will decrease Outcome:  Progressing   Problem: Elimination: Goal: Will not experience complications related to bowel motility Outcome: Progressing Goal: Will not experience complications related to urinary retention Outcome: Progressing   Problem: Pain Managment: Goal: General experience of comfort will improve Outcome: Progressing   Problem: Safety: Goal: Ability to remain free from injury will improve Outcome: Progressing   Problem: Skin Integrity: Goal: Risk for impaired skin integrity will decrease Outcome: Progressing

## 2023-08-25 NOTE — Consult Note (Signed)
PHARMACY NOTE:  ANTIMICROBIAL RENAL DOSAGE ADJUSTMENT  Current antimicrobial regimen includes a mismatch between antimicrobial dosage and estimated renal function.  As per policy approved by the Pharmacy & Therapeutics and Medical Executive Committees, the antimicrobial dosage will be adjusted accordingly.  Current antimicrobial dosage:  Augmentin 875 mg BID  Indication: PNA  Renal Function:  Estimated Creatinine Clearance: 15.2 mL/min (A) (by C-G formula based on SCr of 5.17 mg/dL (H)). [x]      On intermittent HD, scheduled: []      On CRRT    Antimicrobial dosage has been changed to:  Augmentin 500 mg BID  Additional comments:   Thank you for allowing pharmacy to be a part of this patient's care.  Ronnald Ramp, [facility] 08/25/2023 3:37 PM

## 2023-08-25 NOTE — Discharge Planning (Signed)
ESTABLISHED HOME HEMO DIALYSIS Outpatient Facility [facility] [address] [place] [address] [phone]  Confirmed patient is active and can resume upon discharge.  Dimas Chyle Dialysis Coordinator II  [facility] Cell: [phone] eFax: [phone] [email or web address]

## 2023-08-25 NOTE — TOC Progression Note (Signed)
Transition of Care [facility]) - Progression Note    Patient Details  Name: Emma Thomas MRN: [id number] Date of Birth: 1996-03-08  [facility] of [facility]) CM/SW Contact  Truddie Hidden, RN Phone Number: 08/25/2023, 11:56 AM  Clinical Narrative:    TOC continues ongoing assessments for needs that may arise.     Expected Discharge Plan: Home/Self Care Barriers to Discharge: Continued Medical Work up  Expected Discharge Plan and Services       Living arrangements for the past 2 months: Single Family Home                                       Social Determinants of Health (SDOH) Interventions SDOH Screenings   Food Insecurity: No Food Insecurity (08/23/2023)  Housing: Low Risk  (08/23/2023)  Transportation Needs: No Transportation Needs (08/23/2023)  Utilities: Not At Risk (08/23/2023)  Financial Resource Strain: Low Risk  (07/15/2023)   [facility]  Tobacco Use: Low Risk  (08/23/2023)    Readmission Risk Interventions    08/23/2023    2:55 PM 07/09/2023   11:05 AM 06/21/2023    1:28 PM  Readmission Risk Prevention Plan  Transportation Screening Complete Complete Complete  PCP or Specialist Appt within 3-5 Days   Complete  HRI or Home Care Consult   Patient refused  Social Work Consult for Recovery Care Planning/Counseling   Complete  Palliative Care Screening   Not Applicable  Medication Review Oceanographer) Complete Complete Complete  PCP or Specialist appointment within 3-5 days of discharge Complete Complete   HRI or Home Care Consult Complete Not Complete   HRI or Home Care Consult Pt Refusal Comments  pt stated she does not have home care at this time and did not want home care.   SW Recovery Care/Counseling Consult Complete Complete   Palliative Care Screening Not Applicable Not Applicable   Skilled Nursing Facility Complete Not Applicable

## 2023-08-26 DIAGNOSIS — A419 Sepsis, unspecified organism: Secondary | ICD-10-CM | POA: Diagnosis not present

## 2023-08-26 DIAGNOSIS — R1013 Epigastric pain: Secondary | ICD-10-CM | POA: Diagnosis not present

## 2023-08-26 DIAGNOSIS — E871 Hypo-osmolality and hyponatremia: Secondary | ICD-10-CM

## 2023-08-26 DIAGNOSIS — J189 Pneumonia, unspecified organism: Secondary | ICD-10-CM | POA: Diagnosis not present

## 2023-08-26 DIAGNOSIS — E1022 Type 1 diabetes mellitus with diabetic chronic kidney disease: Secondary | ICD-10-CM

## 2023-08-26 DIAGNOSIS — E876 Hypokalemia: Secondary | ICD-10-CM

## 2023-08-26 DIAGNOSIS — R0781 Pleurodynia: Secondary | ICD-10-CM | POA: Diagnosis not present

## 2023-08-26 LAB — GLUCOSE, CAPILLARY: Glucose-Capillary: 194 mg/dL — ABNORMAL HIGH (ref 70–99)

## 2023-08-26 LAB — CBC
HCT: 26 % — ABNORMAL LOW (ref 36.0–46.0)
Hemoglobin: 8.5 g/dL — ABNORMAL LOW (ref 12.0–15.0)
MCH: 29.1 pg (ref 26.0–34.0)
MCHC: 32.7 g/dL (ref 30.0–36.0)
MCV: 89 fL (ref 80.0–100.0)
Platelets: 511 10*3/uL — ABNORMAL HIGH (ref 150–400)
RBC: 2.92 MIL/uL — ABNORMAL LOW (ref 3.87–5.11)
RDW: 13.2 % (ref 11.5–15.5)
WBC: 10.3 10*3/uL (ref 4.0–10.5)
nRBC: 0 % (ref 0.0–0.2)

## 2023-08-26 LAB — RENAL FUNCTION PANEL
Albumin: 2.5 g/dL — ABNORMAL LOW (ref 3.5–5.0)
Anion gap: 19 — ABNORMAL HIGH (ref 5–15)
BUN: 33 mg/dL — ABNORMAL HIGH (ref 6–20)
CO2: 21 mmol/L — ABNORMAL LOW (ref 22–32)
Calcium: 7.9 mg/dL — ABNORMAL LOW (ref 8.9–10.3)
Chloride: 90 mmol/L — ABNORMAL LOW (ref 98–111)
Creatinine, Ser: 6.72 mg/dL — ABNORMAL HIGH (ref 0.44–1.00)
GFR, Estimated: 8 mL/min — ABNORMAL LOW (ref >60)
Glucose, Bld: 351 mg/dL — ABNORMAL HIGH (ref 70–99)
Phosphorus: 6.2 mg/dL — ABNORMAL HIGH (ref 2.5–4.6)
Potassium: 3.3 mmol/L — ABNORMAL LOW (ref 3.5–5.1)
Sodium: 130 mmol/L — ABNORMAL LOW (ref 135–145)

## 2023-08-26 MED ORDER — EPOETIN ALFA 10000 UNIT/ML IJ SOLN
INTRAMUSCULAR | Status: AC
  Filled 2023-08-26: qty 1

## 2023-08-26 MED ORDER — PANTOPRAZOLE SODIUM 40 MG PO TBEC: 40.0000 mg | DELAYED_RELEASE_TABLET | Freq: Every day | ORAL | 0 refills | Status: DC

## 2023-08-26 MED ORDER — ANTICOAGULANT SODIUM CITRATE 4% (200MG/5ML) IV SOLN: 5.0000 mL | Status: DC | PRN

## 2023-08-26 MED ORDER — AMOXICILLIN-POT CLAVULANATE 500-125 MG PO TABS: 1.0000 | ORAL_TABLET | Freq: Two times a day (BID) | ORAL | 0 refills | Status: AC

## 2023-08-26 MED ORDER — LIDOCAINE-PRILOCAINE 2.5-2.5 % EX CREA: 1.0000 | TOPICAL_CREAM | CUTANEOUS | Status: DC | PRN

## 2023-08-26 MED ORDER — NEPRO/CARBSTEADY PO LIQD: 237.0000 mL | ORAL | Status: DC | PRN

## 2023-08-26 MED ORDER — HEPARIN SODIUM (PORCINE) 1000 UNIT/ML DIALYSIS: 1000.0000 [IU] | INTRAMUSCULAR | Status: DC | PRN

## 2023-08-26 MED ORDER — LIDOCAINE HCL (PF) 1 % IJ SOLN: 5.0000 mL | INTRAMUSCULAR | Status: DC | PRN

## 2023-08-26 MED ORDER — GUAIFENESIN-CODEINE 100-10 MG/5ML PO SOLN: 5.0000 mL | Freq: Two times a day (BID) | ORAL | 0 refills | Status: DC | PRN

## 2023-08-26 MED ORDER — PENTAFLUOROPROP-TETRAFLUOROETH EX AERO: 1.0000 | INHALATION_SPRAY | CUTANEOUS | Status: DC | PRN

## 2023-08-26 MED ORDER — AZITHROMYCIN 250 MG PO TABS: ORAL_TABLET | ORAL | 0 refills | Status: DC

## 2023-08-26 MED ORDER — ONDANSETRON HCL 4 MG PO TABS: 4.0000 mg | ORAL_TABLET | Freq: Three times a day (TID) | ORAL | 0 refills | Status: DC | PRN

## 2023-08-26 MED ORDER — METOPROLOL TARTRATE 25 MG PO TABS: 25.0000 mg | ORAL_TABLET | Freq: Two times a day (BID) | ORAL | 0 refills | Status: DC

## 2023-08-26 MED ORDER — ALTEPLASE 2 MG IJ SOLR: 2.0000 mg | Freq: Once | INTRAMUSCULAR | Status: DC | PRN

## 2023-08-26 NOTE — Progress Notes (Signed)
[facility]  ROUNDING NOTE   Subjective:   Emma Thomas  is a 27 y.o female with past medical history of diabetes, anemia, and diabetes type 1, and end stage renal disease on home hemodialysis dialysis. Patient presents to the ED with weakness, shortness of breath and chest pain. She has been admitted for ESRD on hemodialysis (HCC) [N18.6, Z99.2] Severe sepsis (HCC) [A41.9, R65.20] Failure of outpatient treatment [Z78.9] Community acquired pneumonia, unspecified laterality [J18.9]  Patient is known to our practice and completes home hemodialysis supervised by Dr. Cherylann Ratel.  Patient seen and evaluated during dialysis   HEMODIALYSIS FLOWSHEET:  Blood Flow Rate (mL/min): 400 mL/min Arterial Pressure (mmHg): -175.75 mmHg Venous Pressure (mmHg): 233.93 mmHg TMP (mmHg): 5.05 mmHg Ultrafiltration Rate (mL/min): 325 mL/min Dialysate Flow Rate (mL/min): 300 ml/min  Continues to have nausea   Objective:  Vital signs in last 24 hours:  Temp:  [98.3 F (36.8 C)-99.5 F (37.5 C)] 98.4 F (36.9 C) (10/10 1035) Pulse Rate:  [80-99] 84 (10/10 1035) Resp:  [16-25] 19 (10/10 1035) BP: (128-152)/(82-96) 152/92 (10/10 1035) SpO2:  [95 %-100 %] 95 % (10/10 1035) Weight:  [67 kg] 67 kg (10/10 1035)  Weight change:  Filed Weights   08/24/23 1835 08/26/23 0801 08/26/23 1035  Weight: 67.3 kg 67 kg 67 kg    Intake/Output: I/O last 3 completed shifts: In: 100 [IV Piggyback:100] Out: -    Intake/Output this shift:  Total I/O In: -  Out: -200   Physical Exam: General: Ill appearing  Head: Normocephalic, atraumatic. Dry oral mucosal membranes  Eyes: Anicteric  Lungs:  Clear to auscultation  Heart: Regular rate and rhythm  Abdomen:  Soft, nontender  Extremities:  No peripheral edema.  Neurologic: Nonfocal, moving all four extremities  Skin: No lesions  Access: Rt AVF    Basic Metabolic Panel: Recent Labs  Lab 08/22/23 1120 08/23/23 0553 08/24/23 0531  08/25/23 0529 08/26/23 0919  NA 130* 132* 135 135 130*  K 3.8 4.3 3.3* 3.1* 3.3*  CL 93* 92* 95* 94* 90*  CO2 15* 8* 21* 24 21*  GLUCOSE 172* 353* 197* 117* 351*  BUN 71* 39* 44* 20 33*  CREATININE 13.83* 7.48* 8.71* 5.17* 6.72*  CALCIUM 7.9* 7.9* 8.2* 8.0* 7.9*  PHOS  --   --   --   --  6.2*    Liver Function Tests: Recent Labs  Lab 08/23/23 0553 08/26/23 0919  AST 32  --   ALT 22  --   ALKPHOS 89  --   BILITOT 2.7*  --   PROT 7.4  --   ALBUMIN 2.8* 2.5*   No results for input(s): "LIPASE", "AMYLASE" in the last 168 hours. No results for input(s): "AMMONIA" in the last 168 hours.  CBC: Recent Labs  Lab 08/22/23 1120 08/23/23 0553 08/24/23 0531 08/25/23 0529 08/26/23 0919  WBC 17.0* 18.7* 14.4* 9.4 10.3  HGB 7.3* 7.0* 7.2* 8.5* 8.5*  HCT 20.9* 20.8* 21.6* 25.0* 26.0*  MCV 85.3 89.7 87.8 85.9 89.0  PLT 523* 417* 458* 481* 511*    Cardiac Enzymes: No results for input(s): "CKTOTAL", "CKMB", "CKMBINDEX", "TROPONINI" in the last 168 hours.  BNP: Invalid input(s): "POCBNP"  CBG: Recent Labs  Lab 08/24/23 2016 08/25/23 0341 08/25/23 0732 08/25/23 1156 08/25/23 2206  GLUCAP 113* 120* 114* 121* 220*    Microbiology: Results for orders placed or performed during the hospital encounter of 08/22/23  Resp panel by RT-PCR (RSV, Flu A&B, Covid) Anterior Nasal Swab  Status: None   Collection Time: 08/22/23 12:38 PM   Specimen: Anterior Nasal Swab  Result Value Ref Range Status   SARS Coronavirus 2 by RT PCR NEGATIVE NEGATIVE Final    Comment: (NOTE) SARS-CoV-2 target nucleic acids are NOT DETECTED.  The SARS-CoV-2 RNA is generally detectable in upper respiratory specimens during the acute phase of infection. The lowest concentration of SARS-CoV-2 viral copies this assay can detect is 138 copies/mL. A negative result does not preclude SARS-Cov-2 infection and should not be used as the sole basis for treatment or other patient management decisions. A  negative result may occur with  improper specimen collection/handling, submission of specimen other than nasopharyngeal swab, presence of viral mutation(s) within the areas targeted by this assay, and inadequate number of viral copies(<138 copies/mL). A negative result must be combined with clinical observations, patient history, and epidemiological information. The expected result is Negative.  Fact Sheet for Patients:  BloggerCourse.com  Fact Sheet for Healthcare Providers:  SeriousBroker.it  This test is no t yet approved or cleared by the [place] FDA and  has been authorized for detection and/or diagnosis of SARS-CoV-2 by FDA under an Emergency Use Authorization (EUA). This EUA will remain  in effect (meaning this test can be used) for the duration of the COVID-19 declaration under Section 564(b)(1) of the Act, 21 U.S.C.section 360bbb-3(b)(1), unless the authorization is terminated  or revoked sooner.       Influenza A by PCR NEGATIVE NEGATIVE Final   Influenza B by PCR NEGATIVE NEGATIVE Final    Comment: (NOTE) The Xpert Xpress SARS-CoV-2/FLU/RSV plus assay is intended as an aid in the diagnosis of influenza from Nasopharyngeal swab specimens and should not be used as a sole basis for treatment. Nasal washings and aspirates are unacceptable for Xpert Xpress SARS-CoV-2/FLU/RSV testing.  Fact Sheet for Patients: BloggerCourse.com  Fact Sheet for Healthcare Providers: SeriousBroker.it  This test is not yet approved or cleared by the [place] FDA and has been authorized for detection and/or diagnosis of SARS-CoV-2 by FDA under an Emergency Use Authorization (EUA). This EUA will remain in effect (meaning this test can be used) for the duration of the COVID-19 declaration under Section 564(b)(1) of the Act, 21 U.S.C. section 360bbb-3(b)(1), unless the authorization  is terminated or revoked.     Resp Syncytial Virus by PCR NEGATIVE NEGATIVE Final    Comment: (NOTE) Fact Sheet for Patients: BloggerCourse.com  Fact Sheet for Healthcare Providers: SeriousBroker.it  This test is not yet approved or cleared by the [place] FDA and has been authorized for detection and/or diagnosis of SARS-CoV-2 by FDA under an Emergency Use Authorization (EUA). This EUA will remain in effect (meaning this test can be used) for the duration of the COVID-19 declaration under Section 564(b)(1) of the Act, 21 U.S.C. section 360bbb-3(b)(1), unless the authorization is terminated or revoked.  Performed at [facility], [address] [place], [place] [address]   Culture, blood (Routine X 2) w Reflex to ID Panel     Status: None (Preliminary result)   Collection Time: 08/22/23  2:37 PM   Specimen: BLOOD  Result Value Ref Range Status   Specimen Description BLOOD BLOOD LEFT HAND  Final   Special Requests   Final    BOTTLES DRAWN AEROBIC AND ANAEROBIC Blood Culture adequate volume   Culture   Final    NO GROWTH 4 DAYS Performed at [facility], [address], [place], [place] [address]    Report Status PENDING  Incomplete  Culture, blood (Routine X 2) w Reflex to ID Panel     Status: None (Preliminary result)   Collection Time: 08/22/23 11:39 PM   Specimen: BLOOD LEFT ARM  Result Value Ref Range Status   Specimen Description BLOOD LEFT ARM  Final   Special Requests   Final    BOTTLES DRAWN AEROBIC AND ANAEROBIC Blood Culture adequate volume   Culture   Final    NO GROWTH 4 DAYS Performed at [facility], [address] [place], [place] [address]    Report Status PENDING  Incomplete  MRSA Next Gen by PCR, Nasal     Status: None   Collection Time: 08/23/23 10:26 AM   Specimen: Nasal Mucosa; Nasal Swab  Result Value Ref Range Status   MRSA by PCR Next Gen NOT DETECTED NOT  DETECTED Final    Comment: (NOTE) The GeneXpert MRSA Assay (FDA approved for NASAL specimens only), is one component of a comprehensive MRSA colonization surveillance program. It is not intended to diagnose MRSA infection nor to guide or monitor treatment for MRSA infections. Test performance is not FDA approved in patients less than 66 years old. Performed at [facility], [address] [place], [place] [address]     Coagulation Studies: No results for input(s): "LABPROT", "INR" in the last 72 hours.  Urinalysis: No results for input(s): "COLORURINE", "LABSPEC", "PHURINE", "GLUCOSEU", "HGBUR", "BILIRUBINUR", "KETONESUR", "PROTEINUR", "UROBILINOGEN", "NITRITE", "LEUKOCYTESUR" in the last 72 hours.  Invalid input(s): "APPERANCEUR"    Imaging: No results found.   Medications:    anticoagulant sodium citrate     promethazine (PHENERGAN) injection (IM or IVPB) 12.5 mg (08/24/23 2141)    amLODipine  10 mg Oral Daily   amoxicillin-clavulanate  1 tablet Oral BID   atorvastatin  80 mg Oral QHS   azithromycin  500 mg Oral Daily   bumetanide  1 mg Oral BID   epoetin (EPOGEN/PROCRIT) injection  10,000 Units Intravenous Q T,Th,Sa-HD   heparin  5,000 Units Subcutaneous Q8H   insulin aspart  0-5 Units Subcutaneous QHS   insulin aspart  0-9 Units Subcutaneous TID WC   insulin glargine-yfgn  14 Units Subcutaneous QHS   metoprolol tartrate  25 mg Oral BID   montelukast  10 mg Oral QHS   multivitamin  1 tablet Oral QHS   ondansetron (ZOFRAN) IV  4 mg Intravenous Q6H   pantoprazole (PROTONIX) IV  40 mg Intravenous Q12H   Vitamin D (Ergocalciferol)  50,000 Units Oral Q7 days   alteplase, anticoagulant sodium citrate, benzonatate, feeding supplement (NEPRO CARB STEADY), guaiFENesin-codeine, heparin, hydrALAZINE, lidocaine, lidocaine (PF), lidocaine-prilocaine, lidocaine-prilocaine, metoCLOPramide (REGLAN) injection, nitroGLYCERIN, oxyCODONE-acetaminophen,  pentafluoroprop-tetrafluoroeth, promethazine (PHENERGAN) injection (IM or IVPB), senna-docusate, SUMAtriptan  Assessment/ Plan:  Ms. AAYANA REINERTSEN is a 27 y.o.  female with past medical history of diabetes, anemia, and diabetes type 1, and end stage renal disease on home hemodialysis dialysis. Patient presents to the ED with weakness, shortness of breath and chest pain. She has been admitted for ESRD on hemodialysis (HCC) [N18.6, Z99.2] Severe sepsis (HCC) [A41.9, R65.20] Failure of outpatient treatment [Z78.9] Community acquired pneumonia, unspecified laterality [J18.9]  [facility]  End stage renal disease on home hemodialysis. Dialysis scheduled today, UF 1L reduced to 0 per patient request. Due to abdominal discomfort, patient terminated treatment 1.5hr eatly. Next treatment scheduled for Saturday  2. Anemia of chronic kidney disease Lab Results  Component Value Date   HGB 8.5 (L) 08/26/2023    Continue  EPO with dialysis. Suggest avoiding blood products as patient is renal transplant candidate.   3. Secondary Hyperparathyroidism: with outpatient labs: none available.   Lab Results  Component Value Date   CALCIUM 7.9 (L) 08/26/2023   CAION 0.97 (L) 01/08/2023   PHOS 6.2 (H) 08/26/2023    Will continue to monitor bone minerals. Oral intake reduced due to nausea  4. 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 Home regimen includes humalog. Most recent hemoglobin A1c is 8.5 on 06/18/23.    LOS: 4   10/10/202411:06 AM

## 2023-08-26 NOTE — Assessment & Plan Note (Signed)
Will not replace in a dialysis patient

## 2023-08-26 NOTE — Plan of Care (Signed)
  Problem: Pain Managment: Goal: General experience of comfort will improve Outcome: Progressing   Problem: Coping: Goal: Level of anxiety will decrease Outcome: Progressing   Problem: Nutrition: Goal: Adequate nutrition will be maintained Outcome: Progressing   Problem: Clinical Measurements: Goal: Ability to maintain clinical measurements within normal limits will improve Outcome: Progressing

## 2023-08-26 NOTE — Progress Notes (Signed)
Patient provided discharge instructions. IV removed. All questions answered.

## 2023-08-26 NOTE — Plan of Care (Signed)
Problem: Fluid Volume: Goal: Hemodynamic stability will improve Outcome: Adequate for Discharge   Problem: Clinical Measurements: Goal: Diagnostic test results will improve Outcome: Adequate for Discharge Goal: Signs and symptoms of infection will decrease Outcome: Adequate for Discharge   Problem: Respiratory: Goal: Ability to maintain adequate ventilation will improve Outcome: Adequate for Discharge   Problem: Education: Goal: Ability to describe self-care measures that may prevent or decrease complications (Diabetes Survival Skills Education) will improve Outcome: Adequate for Discharge Goal: Individualized Educational Video(s) Outcome: Adequate for Discharge   Problem: Cardiac: Goal: Ability to maintain an adequate cardiac output will improve Outcome: Adequate for Discharge   Problem: Health Behavior/Discharge Planning: Goal: Ability to identify and utilize available resources and services will improve Outcome: Adequate for Discharge Goal: Ability to manage health-related needs will improve Outcome: Adequate for Discharge   Problem: Fluid Volume: Goal: Ability to achieve a balanced intake and output will improve Outcome: Adequate for Discharge   Problem: Metabolic: Goal: Ability to maintain appropriate glucose levels will improve Outcome: Adequate for Discharge   Problem: Nutritional: Goal: Maintenance of adequate nutrition will improve Outcome: Adequate for Discharge Goal: Maintenance of adequate weight for body size and type will improve Outcome: Adequate for Discharge   Problem: Respiratory: Goal: Will regain and/or maintain adequate ventilation Outcome: Adequate for Discharge   Problem: Urinary Elimination: Goal: Ability to achieve and maintain adequate renal perfusion and functioning will improve Outcome: Adequate for Discharge   Problem: Education: Goal: Ability to describe self-care measures that may prevent or decrease complications (Diabetes  Survival Skills Education) will improve Outcome: Adequate for Discharge Goal: Individualized Educational Video(s) Outcome: Adequate for Discharge   Problem: Coping: Goal: Ability to adjust to condition or change in health will improve Outcome: Adequate for Discharge   Problem: Fluid Volume: Goal: Ability to maintain a balanced intake and output will improve Outcome: Adequate for Discharge   Problem: Health Behavior/Discharge Planning: Goal: Ability to identify and utilize available resources and services will improve Outcome: Adequate for Discharge Goal: Ability to manage health-related needs will improve Outcome: Adequate for Discharge   Problem: Metabolic: Goal: Ability to maintain appropriate glucose levels will improve Outcome: Adequate for Discharge   Problem: Nutritional: Goal: Maintenance of adequate nutrition will improve Outcome: Adequate for Discharge Goal: Progress toward achieving an optimal weight will improve Outcome: Adequate for Discharge   Problem: Skin Integrity: Goal: Risk for impaired skin integrity will decrease Outcome: Adequate for Discharge   Problem: Tissue Perfusion: Goal: Adequacy of tissue perfusion will improve Outcome: Adequate for Discharge   Problem: Education: Goal: Knowledge of General Education information will improve Description: Including pain rating scale, medication(s)/side effects and non-pharmacologic comfort measures Outcome: Adequate for Discharge   Problem: Health Behavior/Discharge Planning: Goal: Ability to manage health-related needs will improve Outcome: Adequate for Discharge   Problem: Clinical Measurements: Goal: Ability to maintain clinical measurements within normal limits will improve Outcome: Adequate for Discharge Goal: Will remain free from infection Outcome: Adequate for Discharge Goal: Diagnostic test results will improve Outcome: Adequate for Discharge Goal: Respiratory complications will  improve Outcome: Adequate for Discharge Goal: Cardiovascular complication will be avoided Outcome: Adequate for Discharge   Problem: Activity: Goal: Risk for activity intolerance will decrease Outcome: Adequate for Discharge   Problem: Nutrition: Goal: Adequate nutrition will be maintained Outcome: Adequate for Discharge   Problem: Coping: Goal: Level of anxiety will decrease Outcome: Adequate for Discharge   Problem: Elimination: Goal: Will not experience complications related to bowel motility Outcome: Adequate for Discharge Goal: Will  not experience complications related to urinary retention Outcome: Adequate for Discharge   Problem: Pain Managment: Goal: General experience of comfort will improve Outcome: Adequate for Discharge   Problem: Safety: Goal: Ability to remain free from injury will improve Outcome: Adequate for Discharge   Problem: Skin Integrity: Goal: Risk for impaired skin integrity will decrease Outcome: Adequate for Discharge

## 2023-08-26 NOTE — Assessment & Plan Note (Signed)
Dialysis to manage

## 2023-08-26 NOTE — Progress Notes (Signed)
Received patient in bed to unit.    Informed consent signed and in chart.    TX duration: 2 hrs     Transported back to floor  Hand-off given to patient's nurse.   Access used:  rt avf Access issues: n/a  Total UF removed:  Medication(s) given: Epoetin 10,000 units Post HD weight: 67.0kg     Maple Hudson, RN Dialysis Unit

## 2023-08-26 NOTE — Discharge Summary (Signed)
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 [name] is a 27 year old female with history of end-stage renal disease on hemodialysis at home, hypertension, hyperlipidemia, migraine headaches, history of osteomyelitis of the left foot, insulin-dependent diabetes mellitus type 1, who presents to emergency department for chief concerns of  shortness of breath, chest pain.  Hospital course / significant events:  10/06: Chest x-ray 2 view: Was read as increasing lung base opacity with small effusions. Admitted to hospitalist service for sepsis d/t CAP.  10/07: BCx pending, pt stable - Hgb below optimal range plan EPO with dialysis - avoiding blood products as patient is renal transplant candidate.  10/08: BCx NGx2d. Persistent nausea, unable to tolerate po, have d/c IV abx which hopefully will help, gave single dose IV pain meds and advised NO FURTHER pain meds IV. Plan next dialysis treatment later today. If tolerating po can hopefully d/c home tomorrow, HD today 10/9.  Patient having nausea vomiting and chest pain.  Not feeling well.  Pain also in her back. 10/10.  Nursing staff called me today that patient was feeling much better and wanted to go home.  No further nausea or vomiting.  No further  abdominal pain or chest pain.        Assessment and Plan: * Severe sepsis (HCC) Present on admission.  Markedly elevated leukocytosis, increased respiration rate, source is pneumonia.  Patient received IV antibiotics here in the hospital and will give 2 more days of Augmentin upon going home and 1 more day of Zithromax.  CAP (community acquired pneumonia) Will discharge on 2 more days of Augmentin and 1 more day of Zithromax.  Pleuritic chest pain Likely secondary to pneumonia.  Given 1 dose of Solu-Medrol.  Pain is gone today.  If pain returns can take aspirin.  Epigastric pain With nausea vomiting.  Improved with IV Protonix will prescribe Protonix upon going home.  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  She received Semglee insulin last night.  ESRD on dialysis [facility]) Dialysis done this morning..  Patient does dialysis at home.  Hypertension Continue Norvasc and metoprolol.  Anemia due to chronic kidney disease Last hemoglobin 8.5  High anion gap metabolic acidosis Secondary to end-stage renal disease on hemodialysis   Dyslipidemia Atorvastatin 80 mg daily  Hypokalemia Will not replace in a dialysis patient  Hyponatremia Dialysis to manage         Consultants: Nephrology Procedures performed: Dialysis Disposition: Home Diet recommendation:  Cardiac and Carb modified diet DISCHARGE MEDICATION: Allergies as of 08/26/2023       Reactions   Losartan Swelling   Angioedema of tongue        Medication List  STOP taking these medications    benzonatate 200 MG capsule Commonly known as: TESSALON   oxyCODONE-acetaminophen 5-325 MG tablet Commonly known as: PERCOCET/ROXICET   polyethylene glycol 17 g packet Commonly known as: MIRALAX / GLYCOLAX   promethazine-dextromethorphan 6.25-15 MG/5ML syrup Commonly known as: PROMETHAZINE-DM   sevelamer carbonate 800  MG tablet Commonly known as: RENVELA       TAKE these medications    acetaminophen 500 MG tablet Commonly known as: TYLENOL Take 2 tablets (1,000 mg total) by mouth every 6 (six) hours as needed for mild pain.   amLODipine 10 MG tablet Commonly known as: NORVASC Take 1 tablet (10 mg total) by mouth daily.   amoxicillin-clavulanate 500-125 MG tablet Commonly known as: AUGMENTIN Take 1 tablet by mouth 2 (two) times daily for 3 days. What changed:  when to take this reasons to take this   atorvastatin 80 MG tablet Commonly known as: LIPITOR Take 80 mg by mouth daily.   azithromycin 250 MG tablet Commonly known as: ZITHROMAX One tab po daily for one more day Start taking on: August 27, 2023   bumetanide 1 MG tablet Commonly known as: BUMEX Take 1 mg by mouth 2 (two) times daily.   guaiFENesin-codeine 100-10 MG/5ML syrup Take 5 mLs by mouth every 12 (twelve) hours as needed for cough (cough not responding to tessalon).   HumaLOG 100 UNIT/ML injection Generic drug: insulin lispro Inject 0-1 mLs (0-100 Units total) into the skin daily. Uses with Insulin Pump   metoprolol tartrate 25 MG tablet Commonly known as: LOPRESSOR Take 1 tablet (25 mg total) by mouth 2 (two) times daily.   montelukast 10 MG tablet Commonly known as: SINGULAIR Take 10 mg by mouth at bedtime.   multivitamin Tabs tablet Take 1 tablet by mouth at bedtime.   norethindrone-ethinyl estradiol 1-20 MG-MCG tablet Commonly known as: LOESTRIN Take 1 tablet by mouth daily.   ondansetron 4 MG tablet Commonly known as: ZOFRAN Take 1 tablet (4 mg total) by mouth every 8 (eight) hours as needed for nausea or vomiting. What changed:  medication strength how much to take when to take this reasons to take this   pantoprazole 40 MG tablet Commonly known as: Protonix Take 1 tablet (40 mg total) by mouth daily.   SUMAtriptan 50 MG tablet Commonly known as: IMITREX Take 50 mg by mouth daily as needed  for migraine.   Vitamin D (Ergocalciferol) 1.25 MG (50000 UNIT) Caps capsule Commonly known as: DRISDOL Take 1 capsule (50,000 Units total) by mouth every 7 (seven) days.        Discharge Exam: Filed Weights   08/24/23 1835 08/26/23 0801 08/26/23 1035  Weight: 67.3 kg 67 kg 67 kg   Physical Exam HENT:     Head: Normocephalic.     Mouth/Throat:     Pharynx: No oropharyngeal exudate.  Eyes:     General: Lids are normal.     Conjunctiva/sclera: Conjunctivae normal.  Cardiovascular:     Rate and Rhythm: Normal rate and regular rhythm.     Heart sounds: Normal heart sounds, S1 normal and S2 normal.  Pulmonary:     Breath sounds: No decreased breath sounds, wheezing, rhonchi or rales.  Chest:     Comments: No pain to chest on palpation Abdominal:     Palpations: Abdomen is soft.     Tenderness: There is no abdominal tenderness.  Musculoskeletal:     Right lower leg: No swelling.     Left lower leg: No  swelling.  Skin:    General: Skin is warm.     Findings: No rash.  Neurological:     Mental Status: She is alert and oriented to person, place, and time.      Condition at discharge: stable  The results of significant diagnostics from this hospitalization (including imaging, microbiology, ancillary and laboratory) are listed below for reference.   Imaging Studies: DG Chest 2 View  Result Date: 08/22/2023 CLINICAL DATA:  Chest pain EXAM: CHEST - 2 VIEW COMPARISON:  08/13/2023 and older FINDINGS: Underinflation. Tiny effusions with some increasing adjacent opacities. Atelectasis favored over infiltrate recommend follow-up. No pneumothorax or edema. Normal cardiopericardial silhouette. IMPRESSION: Increasing lung base opacities with small effusions. Recommend follow-up Electronically Signed   By: Karen Kays M.D.   On: 08/22/2023 11:54   DG Foot Complete Left  Result Date: 08/13/2023 CLINICAL DATA:  Pain and fever at the site of a left great toe amputation for the past 2  days. The foot is also swollen. EXAM: LEFT FOOT - COMPLETE 3+ VIEW COMPARISON:  07/09/2023, 07/07/2023 and MRI dated 06/18/2023. FINDINGS: Diffuse soft tissue swelling, most pronounced distally. The previously demonstrated soft tissue gas is no longer seen. 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 There is a small amount of bone erosion and minimal fragmentation at the distal, lateral aspect of the remaining portion of the medial cuneiform. 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 IMPRESSION: 1. Small amount of bone erosion and minimal fragmentation at the distal, lateral aspect of the remaining portion of the medial cuneiform, suspicious for osteomyelitis. 2. Diffuse soft tissue swelling, most pronounced distally. 3. 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 Electronically Signed   By: Beckie Salts M.D.   On: 08/13/2023 13:05   DG Chest 2 View  Result Date: 08/13/2023 CLINICAL DATA:  [id number] Chest pain [id number] EXAM: CHEST - 2 VIEW COMPARISON:  07/07/2023. FINDINGS: Probable patchy atelectatic changes at the left lung base. Bilateral lung fields are otherwise clear. No acute consolidation or major lung collapse. Bilateral costophrenic angles are clear. Normal cardio-mediastinal silhouette. No acute osseous abnormalities. The soft tissues are within normal limits. IMPRESSION: 1. Probable patchy atelectatic changes at the left lung base. Electronically Signed   By: Jules Schick M.D.   On: 08/13/2023 11:17    Microbiology: Results for orders placed or performed during the hospital encounter of 08/22/23  Resp panel by RT-PCR (RSV, Flu A&B, Covid) Anterior Nasal Swab     Status: None   Collection Time: 08/22/23 12:38 PM   Specimen: Anterior Nasal Swab  Result Value Ref Range Status   SARS Coronavirus  2 by RT PCR NEGATIVE NEGATIVE Final    Comment: (NOTE) SARS-CoV-2 target nucleic acids are NOT DETECTED.  The SARS-CoV-2 RNA is generally detectable in upper respiratory specimens during the acute phase of infection. The lowest concentration of SARS-CoV-2 viral copies this assay can detect is 138 copies/mL. A negative result does not preclude SARS-Cov-2 infection and should not be used as the sole basis for treatment or other patient management decisions. A negative result may occur with  improper specimen collection/handling, submission of specimen other than nasopharyngeal swab, presence of viral mutation(s) within the areas targeted by this assay, and inadequate number of viral copies(<138 copies/mL). A negative result must be combined with clinical observations, patient history, and  epidemiological information. The expected result is Negative.  Fact Sheet for Patients:  BloggerCourse.com  Fact Sheet for Healthcare Providers:  SeriousBroker.it  This test is no t yet approved or cleared by the [place] FDA and  has been authorized for detection and/or diagnosis of SARS-CoV-2 by FDA under an Emergency Use Authorization (EUA). This EUA will remain  in effect (meaning this test can be used) for the duration of the COVID-19 declaration under Section 564(b)(1) of the Act, 21 U.S.C.section 360bbb-3(b)(1), unless the authorization is terminated  or revoked sooner.       Influenza A by PCR NEGATIVE NEGATIVE Final   Influenza B by PCR NEGATIVE NEGATIVE Final    Comment: (NOTE) The Xpert Xpress SARS-CoV-2/FLU/RSV plus assay is intended as an aid in the diagnosis of influenza from Nasopharyngeal swab specimens and should not be used as a sole basis for treatment. Nasal washings and aspirates are unacceptable for Xpert Xpress SARS-CoV-2/FLU/RSV testing.  Fact Sheet for Patients: BloggerCourse.com  Fact  Sheet for Healthcare Providers: SeriousBroker.it  This test is not yet approved or cleared by the [place] FDA and has been authorized for detection and/or diagnosis of SARS-CoV-2 by FDA under an Emergency Use Authorization (EUA). This EUA will remain in effect (meaning this test can be used) for the duration of the COVID-19 declaration under Section 564(b)(1) of the Act, 21 U.S.C. section 360bbb-3(b)(1), unless the authorization is terminated or revoked.     Resp Syncytial Virus by PCR NEGATIVE NEGATIVE Final    Comment: (NOTE) Fact Sheet for Patients: BloggerCourse.com  Fact Sheet for Healthcare Providers: SeriousBroker.it  This test is not yet approved or cleared by the [place] FDA and has been authorized for detection and/or diagnosis of SARS-CoV-2 by FDA under an Emergency Use Authorization (EUA). This EUA will remain in effect (meaning this test can be used) for the duration of the COVID-19 declaration under Section 564(b)(1) of the Act, 21 U.S.C. section 360bbb-3(b)(1), unless the authorization is terminated or revoked.  Performed at [facility], [address] [place], [place] [address]   Culture, blood (Routine X 2) w Reflex to ID Panel     Status: None (Preliminary result)   Collection Time: 08/22/23  2:37 PM   Specimen: BLOOD  Result Value Ref Range Status   Specimen Description BLOOD BLOOD LEFT HAND  Final   Special Requests   Final    BOTTLES DRAWN AEROBIC AND ANAEROBIC Blood Culture adequate volume   Culture   Final    NO GROWTH 4 DAYS Performed at [facility], [address] [place], [place] [address]    Report Status PENDING  Incomplete  Culture, blood (Routine X 2) w Reflex to ID Panel     Status: None (Preliminary result)   Collection Time: 08/22/23 11:39 PM   Specimen: BLOOD LEFT ARM  Result Value Ref Range Status   Specimen Description BLOOD  LEFT ARM  Final   Special Requests   Final    BOTTLES DRAWN AEROBIC AND ANAEROBIC Blood Culture adequate volume   Culture   Final    NO GROWTH 4 DAYS Performed at [facility], [address], [place], [place] [address]    Report Status PENDING  Incomplete  MRSA Next Gen by PCR, Nasal     Status: None   Collection Time: 08/23/23 10:26 AM   Specimen: Nasal Mucosa; Nasal Swab  Result Value Ref Range Status   MRSA by PCR Next Gen NOT DETECTED NOT DETECTED Final  Comment: (NOTE) The GeneXpert MRSA Assay (FDA approved for NASAL specimens only), is one component of a comprehensive MRSA colonization surveillance program. It is not intended to diagnose MRSA infection nor to guide or monitor treatment for MRSA infections. Test performance is not FDA approved in patients less than 75 years old. Performed at [facility], [address] [place], [place] [address]     Labs: CBC: Recent Labs  Lab 08/22/23 1120 08/23/23 0553 08/24/23 0531 08/25/23 0529 08/26/23 0919  WBC 17.0* 18.7* 14.4* 9.4 10.3  HGB 7.3* 7.0* 7.2* 8.5* 8.5*  HCT 20.9* 20.8* 21.6* 25.0* 26.0*  MCV 85.3 89.7 87.8 85.9 89.0  PLT 523* 417* 458* 481* 511*   Basic Metabolic Panel: Recent Labs  Lab 08/22/23 1120 08/23/23 0553 08/24/23 0531 08/25/23 0529 08/26/23 0919  NA 130* 132* 135 135 130*  K 3.8 4.3 3.3* 3.1* 3.3*  CL 93* 92* 95* 94* 90*  CO2 15* 8* 21* 24 21*  GLUCOSE 172* 353* 197* 117* 351*  BUN 71* 39* 44* 20 33*  CREATININE 13.83* 7.48* 8.71* 5.17* 6.72*  CALCIUM 7.9* 7.9* 8.2* 8.0* 7.9*  PHOS  --   --   --   --  6.2*   Liver Function Tests: Recent Labs  Lab 08/23/23 0553 08/26/23 0919  AST 32  --   ALT 22  --   ALKPHOS 89  --   BILITOT 2.7*  --   PROT 7.4  --   ALBUMIN 2.8* 2.5*   CBG: Recent Labs  Lab 08/25/23 0341 08/25/23 0732 08/25/23 1156 08/25/23 2206 08/26/23 1149  GLUCAP 120* 114* 121* 220* 194*    Discharge time spent: greater than 30  minutes.  Signed: Alford Highland, MD [facility] 08/26/2023

## 2023-08-27 ENCOUNTER — Other Ambulatory Visit: Payer: Self-pay

## 2023-08-27 ENCOUNTER — Emergency Department: Payer: 59

## 2023-08-27 ENCOUNTER — Emergency Department
Admission: EM | Admit: 2023-08-27 | Discharge: 2023-08-27 | Disposition: A | Discharge status: Home / Self Care | Payer: 59 | Attending: Student in an Organized Health Care Education/Training Program | Admitting: Student in an Organized Health Care Education/Training Program

## 2023-08-27 ENCOUNTER — Encounter: Payer: Self-pay | Admitting: Emergency Medicine

## 2023-08-27 DIAGNOSIS — E1022 Type 1 diabetes mellitus with diabetic chronic kidney disease: Secondary | ICD-10-CM | POA: Insufficient documentation

## 2023-08-27 DIAGNOSIS — E1065 Type 1 diabetes mellitus with hyperglycemia: Secondary | ICD-10-CM | POA: Insufficient documentation

## 2023-08-27 DIAGNOSIS — R112 Nausea with vomiting, unspecified: Secondary | ICD-10-CM

## 2023-08-27 DIAGNOSIS — R5381 Other malaise: Secondary | ICD-10-CM | POA: Diagnosis not present

## 2023-08-27 DIAGNOSIS — Z992 Dependence on renal dialysis: Secondary | ICD-10-CM | POA: Insufficient documentation

## 2023-08-27 DIAGNOSIS — R079 Chest pain, unspecified: Secondary | ICD-10-CM

## 2023-08-27 DIAGNOSIS — N186 End stage renal disease: Secondary | ICD-10-CM | POA: Diagnosis not present

## 2023-08-27 DIAGNOSIS — R110 Nausea: Secondary | ICD-10-CM | POA: Diagnosis present

## 2023-08-27 LAB — BASIC METABOLIC PANEL
Anion gap: 19 — ABNORMAL HIGH (ref 5–15)
BUN: 22 mg/dL — ABNORMAL HIGH (ref 6–20)
CO2: 22 mmol/L (ref 22–32)
Calcium: 8.4 mg/dL — ABNORMAL LOW (ref 8.9–10.3)
Chloride: 93 mmol/L — ABNORMAL LOW (ref 98–111)
Creatinine, Ser: 5.37 mg/dL — ABNORMAL HIGH (ref 0.44–1.00)
GFR, Estimated: 10 mL/min — ABNORMAL LOW (ref >60)
Glucose, Bld: 304 mg/dL — ABNORMAL HIGH (ref 70–99)
Potassium: 3.1 mmol/L — ABNORMAL LOW (ref 3.5–5.1)
Sodium: 134 mmol/L — ABNORMAL LOW (ref 135–145)

## 2023-08-27 LAB — CULTURE, BLOOD (ROUTINE X 2)
Culture: NO GROWTH
Culture: NO GROWTH
Special Requests: ADEQUATE
Special Requests: ADEQUATE

## 2023-08-27 LAB — CBC
HCT: 30.5 % — ABNORMAL LOW (ref 36.0–46.0)
Hemoglobin: 9.9 g/dL — ABNORMAL LOW (ref 12.0–15.0)
MCH: 28.9 pg (ref 26.0–34.0)
MCHC: 32.5 g/dL (ref 30.0–36.0)
MCV: 89.2 fL (ref 80.0–100.0)
Platelets: 486 10*3/uL — ABNORMAL HIGH (ref 150–400)
RBC: 3.42 MIL/uL — ABNORMAL LOW (ref 3.87–5.11)
RDW: 13.1 % (ref 11.5–15.5)
WBC: 10.3 10*3/uL (ref 4.0–10.5)
nRBC: 0 % (ref 0.0–0.2)

## 2023-08-27 LAB — TROPONIN I (HIGH SENSITIVITY)
Troponin I (High Sensitivity): 5 ng/L (ref <18)
Troponin I (High Sensitivity): 5 ng/L (ref <18)

## 2023-08-27 LAB — LACTIC ACID, PLASMA: Lactic Acid, Venous: 0.9 mmol/L (ref 0.5–1.9)

## 2023-08-27 MED ORDER — OXYCODONE-ACETAMINOPHEN 5-325 MG PO TABS
1.0000 | ORAL_TABLET | Freq: Once | ORAL | Status: AC
  Administered 2023-08-27: 1 via ORAL
  Filled 2023-08-27: qty 1

## 2023-08-27 MED ORDER — OXYCODONE HCL 5 MG PO TABS
5.0000 mg | ORAL_TABLET | ORAL | Status: DC | PRN
  Administered 2023-08-27: 5 mg via ORAL
  Filled 2023-08-27: qty 1

## 2023-08-27 MED ORDER — OXYCODONE-ACETAMINOPHEN 5-325 MG PO TABS
1.0000 | ORAL_TABLET | ORAL | 0 refills | Status: DC | PRN
Start: 2023-08-27 — End: 2024-03-07

## 2023-08-27 MED ORDER — PROMETHAZINE HCL 12.5 MG PO TABS: 12.5000 mg | ORAL_TABLET | Freq: Four times a day (QID) | ORAL | 0 refills | Status: DC | PRN

## 2023-08-27 MED ORDER — INSULIN ASPART 100 UNIT/ML IJ SOLN
8.0000 [IU] | Freq: Once | INTRAMUSCULAR | Status: AC
  Administered 2023-08-27: 8 [IU] via SUBCUTANEOUS
  Filled 2023-08-27: qty 1

## 2023-08-27 MED ORDER — PROMETHAZINE HCL 25 MG PO TABS
25.0000 mg | ORAL_TABLET | Freq: Once | ORAL | Status: AC
  Administered 2023-08-27: 25 mg via ORAL
  Filled 2023-08-27: qty 1

## 2023-08-27 MED ORDER — AMOXICILLIN-POT CLAVULANATE 875-125 MG PO TABS
1.0000 | ORAL_TABLET | Freq: Once | ORAL | Status: AC
  Administered 2023-08-27: 1 via ORAL
  Filled 2023-08-27: qty 1

## 2023-08-27 MED ORDER — SODIUM CHLORIDE 0.9 % IV BOLUS
1000.0000 mL | Freq: Once | INTRAVENOUS | Status: AC
  Administered 2023-08-27: 1000 mL via INTRAVENOUS

## 2023-08-27 NOTE — ED Provider Notes (Signed)
[facility] Provider Note    Event Date/Time   First MD Initiated Contact with Patient 08/27/23 [phone]     (approximate)   History   Emesis   HPI  Emma Thomas is a 27 y.o. female presents to the ER for evaluation of nausea and general malaise chest discomfort.  Just admitted for sepsis in the setting of end-stage renal disease on dialysis as well as type 1 diabetes.  Was able to tolerate her antibiotics last night has not taken anything this morning.  Primary concern is pain and discomfort which is consistent with pain and discomfort she was having here in the hospital for which she was receiving narcotic medication she was not given any medications for pain or treatment plan upon discharge.  She denies any abdominal pain.  Primary concern is the pain keeping her from sleeping.     Physical Exam   Triage Vital Signs: ED Triage Vitals  Encounter Vitals Group     BP 08/27/23 0808 (!) 156/101     Systolic BP Percentile --      Diastolic BP Percentile --      Pulse Rate 08/27/23 0808 95     Resp 08/27/23 0808 18     Temp 08/27/23 0808 98.1 F (36.7 C)     Temp Source 08/27/23 0808 Oral     SpO2 08/27/23 0808 100 %     Weight 08/27/23 0807 150 lb (68 kg)     Height 08/27/23 0807 5\' 6"  (1.676 m)     Head Circumference --      Peak Flow --      Pain Score 08/27/23 0807 7     Pain Loc --      Pain Education --      Exclude from Growth Chart --     Most recent vital signs: Vitals:   08/27/23 0808  BP: (!) 156/101  Pulse: 95  Resp: 18  Temp: 98.1 F (36.7 C)  SpO2: 100%     Constitutional: Alert  Eyes: Conjunctivae are normal.  Head: Atraumatic. Nose: No congestion/rhinnorhea. Mouth/Throat: Mucous membranes are moist.   Neck: Painless ROM.  Cardiovascular:   Good peripheral circulation. Respiratory: Normal respiratory effort.  No retractions.  Gastrointestinal: Soft and nontender.  Musculoskeletal:  no deformity Neurologic:  MAE  spontaneously. No gross focal neurologic deficits are appreciated.  Skin:  Skin is warm, dry and intact. No rash noted. Psychiatric: Mood and affect are normal. Speech and behavior are normal.    ED Results / Procedures / Treatments   Labs (all labs ordered are listed, but only abnormal results are displayed) Labs Reviewed  BASIC METABOLIC PANEL - Abnormal; Notable for the following components:      Result Value   Sodium 134 (*)    Potassium 3.1 (*)    Chloride 93 (*)    Glucose, Bld 304 (*)    BUN 22 (*)    Creatinine, Ser 5.37 (*)    Calcium 8.4 (*)    GFR, Estimated 10 (*)    Anion gap 19 (*)    All other components within normal limits  CBC - Abnormal; Notable for the following components:   RBC 3.42 (*)    Hemoglobin 9.9 (*)    HCT 30.5 (*)    Platelets 486 (*)    All other components within normal limits  BLOOD GAS, VENOUS - Abnormal; Notable for the following components:   pH, Ven 7.45 (*)  pCO2, Ven 39 (*)    Acid-Base Excess 3.0 (*)    All other components within normal limits  LACTIC ACID, PLASMA  POC URINE PREG, ED  TROPONIN I (HIGH SENSITIVITY)  TROPONIN I (HIGH SENSITIVITY)     EKG  ED ECG REPORT I, Willy Eddy, the attending physician, personally viewed and interpreted this ECG.   Date: 08/27/2023  EKG Time: 8:12  Rate: 95  Rhythm: sinus  Axis: normal  Intervals: normal  ST&T Change: no stemi, no depressions    RADIOLOGY Please see ED Course for my review and interpretation.  I personally reviewed all radiographic images ordered to evaluate for the above acute complaints and reviewed radiology reports and findings.  These findings were personally discussed with the patient.  Please see medical record for radiology report.    PROCEDURES:  Critical Care performed: No  Procedures   MEDICATIONS ORDERED IN ED: Medications  oxyCODONE (Oxy IR/ROXICODONE) immediate release tablet 5 mg (has no administration in time range)   promethazine (PHENERGAN) tablet 25 mg (25 mg Oral Given 08/27/23 0909)  oxyCODONE-acetaminophen (PERCOCET/ROXICET) 5-325 MG per tablet 1 tablet (1 tablet Oral Given 08/27/23 0909)  amoxicillin-clavulanate (AUGMENTIN) 875-125 MG per tablet 1 tablet (1 tablet Oral Given 08/27/23 0910)  insulin aspart (novoLOG) injection 8 Units (8 Units Subcutaneous Given 08/27/23 1016)  sodium chloride 0.9 % bolus 1,000 mL (1,000 mLs Intravenous New Bag/Given 08/27/23 1015)     IMPRESSION / MDM / ASSESSMENT AND PLAN / ED COURSE  I reviewed the triage vital signs and the nursing notes.                              Differential diagnosis includes, but is not limited to, pneumonia, DKA, electrolyte abnormality, dehydration, gastroenteritis, medication intolerance, ACS, PE  Patient presenting to the ER for evaluation of symptoms as described above.  Based on symptoms, risk factors and considered above differential, this presenting complaint could reflect a potentially life-threatening illness therefore the patient will be placed on continuous pulse oximetry and telemetry for monitoring.  Laboratory evaluation will be sent to evaluate for the above complaints.  Patient's x-ray on my review and interpretation without evidence of pneumothorax.  Does show interval improvement.  Blood work without leukocytosis.  She is hyperglycemic and has elevated anion gap but normal bicarb will check a beta hydroxy and VBG.  Will give IV fluids.  Will give IV insulin will p.o. challenge.   Clinical Course as of 08/27/23 1106  Fri Aug 27, 2023  1042 VBG without acidosis.  Patient receiving IV fluids she is not having any persistent vomiting.  Do anticipate patient is can be okay for continued outpatient management. [PR]    Clinical Course User Index [PR] Willy Eddy, MD   Patient's presentation is not consistent with DKA.  She is not septic.  She is tolerating p.o.  Symptoms resolved with oral pain medication.  Will give  short prescription for pain medication and antiemetic.  This is not consistent with PE given no tachycardia or hypoxia.  Had extensive symptoms of pleuritic chest pain while in the hospital likely secondary to recent pneumonia.  Do not feel that further diagnostic imaging clinically indicated she does appear stable and appropriate for outpatient follow-up.  FINAL CLINICAL IMPRESSION(S) / ED DIAGNOSES   Final diagnoses:  Nausea and vomiting, unspecified vomiting type  Chest pain, unspecified type     Rx / DC Orders   ED Discharge  Orders          Ordered    oxyCODONE-acetaminophen (PERCOCET) 5-325 MG tablet  Every 4 hours PRN        08/27/23 1104    promethazine (PHENERGAN) 12.5 MG tablet  Every 6 hours PRN        08/27/23 1104             Note:  This document was prepared using Dragon voice recognition software and may include unintentional dictation errors.    Willy Eddy, MD 08/27/23 1106

## 2023-08-27 NOTE — ED Triage Notes (Signed)
Pt via POV from home. Pt was admitted and discharged for the hospital yesterday for PNA/sepsis. States she felt like she was ready for d/c but when she got home, she was vomiting, SOB, and having CP. Pt c/o mid-CP at this time. States that she was prescribed abx but unable to keep them down. Pt is A&OX4 and NAD ambulatory to triage.

## 2023-08-27 NOTE — ED Notes (Signed)
Pt A&O x4, no obvious distress noted, respirations regular/unlabored. Pt verbalizes understanding of discharge instructions. Pt able to ambulate from ED independently.   

## 2023-08-27 NOTE — Inpatient Diabetes Management (Signed)
Inpatient Diabetes Program Recommendations  AACE/ADA: New Consensus Statement on Inpatient Glycemic Control (2015)  Target Ranges:  Prepandial:   less than 140 mg/dL      Peak postprandial:   less than 180 mg/dL (1-2 hours)      Critically ill patients:  140 - 180 mg/dL   Lab Results  Component Value Date   GLUCAP 194 (H) 08/26/2023   HGBA1C 8.5 (H) 06/18/2023    Review of Glycemic Control  History: Type 1 Diabetes, ESRD   Home DM Meds: Tandem Insulin Pump  Current orders for Inpatient glycemic control: None  Inpatient Diabetes Program Recommendations:   Patient currently in ED and does not have her insulin pump on and is not with her according to RN Gelene Mink. Please consider: -Semglee 14 units now and qday -Novolog 0-9 units tid, 0-5 units hs correction  Thank you, Darel Hong E. Raul Winterhalter, RN, MSN, CDE  Diabetes Coordinator Inpatient Glycemic Control Team Team Pager [phone] (8am-5pm) 08/27/2023 10:44 AM

## 2023-08-28 LAB — BLOOD GAS, VENOUS
Acid-Base Excess: 3 mmol/L — ABNORMAL HIGH (ref 0.0–2.0)
Bicarbonate: 27.1 mmol/L (ref 20.0–28.0)
O2 Saturation: 73.1 %
Patient temperature: 37
pCO2, Ven: 39 mm[Hg] — ABNORMAL LOW (ref 44–60)
pH, Ven: 7.45 — ABNORMAL HIGH (ref 7.25–7.43)
pO2, Ven: 42 mm[Hg] (ref 32–45)

## 2023-09-23 ENCOUNTER — Encounter: Payer: Self-pay | Admitting: Medical Oncology

## 2023-09-23 ENCOUNTER — Emergency Department
Admission: EM | Admit: 2023-09-23 | Discharge: 2023-09-23 | Disposition: A | Discharge status: Home / Self Care | Payer: 59 | Attending: Student in an Organized Health Care Education/Training Program | Admitting: Student in an Organized Health Care Education/Training Program

## 2023-09-23 ENCOUNTER — Emergency Department: Payer: 59

## 2023-09-23 ENCOUNTER — Other Ambulatory Visit: Payer: Self-pay

## 2023-09-23 DIAGNOSIS — N186 End stage renal disease: Secondary | ICD-10-CM | POA: Diagnosis not present

## 2023-09-23 DIAGNOSIS — D649 Anemia, unspecified: Secondary | ICD-10-CM | POA: Insufficient documentation

## 2023-09-23 DIAGNOSIS — Z992 Dependence on renal dialysis: Secondary | ICD-10-CM | POA: Insufficient documentation

## 2023-09-23 DIAGNOSIS — R5383 Other fatigue: Secondary | ICD-10-CM | POA: Diagnosis present

## 2023-09-23 LAB — BASIC METABOLIC PANEL
Anion gap: 14 (ref 5–15)
BUN: 38 mg/dL — ABNORMAL HIGH (ref 6–20)
CO2: 25 mmol/L (ref 22–32)
Calcium: 7.6 mg/dL — ABNORMAL LOW (ref 8.9–10.3)
Chloride: 96 mmol/L — ABNORMAL LOW (ref 98–111)
Creatinine, Ser: 5.15 mg/dL — ABNORMAL HIGH (ref 0.44–1.00)
GFR, Estimated: 11 mL/min — ABNORMAL LOW (ref >60)
Glucose, Bld: 185 mg/dL — ABNORMAL HIGH (ref 70–99)
Potassium: 3.3 mmol/L — ABNORMAL LOW (ref 3.5–5.1)
Sodium: 135 mmol/L (ref 135–145)

## 2023-09-23 LAB — PREPARE RBC (CROSSMATCH)

## 2023-09-23 LAB — CBC
HCT: 16.9 % — ABNORMAL LOW (ref 36.0–46.0)
Hemoglobin: 5.4 g/dL — ABNORMAL LOW (ref 12.0–15.0)
MCH: 30.2 pg (ref 26.0–34.0)
MCHC: 32 g/dL (ref 30.0–36.0)
MCV: 94.4 fL (ref 80.0–100.0)
Platelets: 340 10*3/uL (ref 150–400)
RBC: 1.79 MIL/uL — ABNORMAL LOW (ref 3.87–5.11)
RDW: 15.8 % — ABNORMAL HIGH (ref 11.5–15.5)
WBC: 10.8 10*3/uL — ABNORMAL HIGH (ref 4.0–10.5)
nRBC: 0 % (ref 0.0–0.2)

## 2023-09-23 MED ORDER — SODIUM CHLORIDE 0.9% FLUSH
10.0000 mL | Freq: Two times a day (BID) | INTRAVENOUS | Status: DC
  Administered 2023-09-23: 10 mL via INTRAVENOUS

## 2023-09-23 NOTE — ED Triage Notes (Signed)
Pt reports she had her HD treatment yesterday and her nurse told her that her HGB was low at a 5 pt was instructed to come to ED for transfusion. Pt reports that she is not having any bleeding or any issues.

## 2023-09-23 NOTE — ED Provider Notes (Signed)
[facility] Provider Note    Event Date/Time   First MD Initiated Contact with Patient 09/23/23 1112     (approximate)   History   Abnormal Lab   HPI  Emma Thomas is a 27 y.o. female presents to the ER for few weeks of progressive worsening fatigue malaise and dyspnea.  Patient does do home dialysis has not had any complications or issues with.  Does take supplemental iron.  Denies any melena or hematochezia.  Was sent to the ER due to blood work showing acute on chronic anemia.  Denies any nosebleeds no injury     Physical Exam   Triage Vital Signs: ED Triage Vitals  Encounter Vitals Group     BP 09/23/23 0927 118/79     Systolic BP Percentile --      Diastolic BP Percentile --      Pulse Rate 09/23/23 0927 86     Resp 09/23/23 0927 18     Temp 09/23/23 0927 99.2 F (37.3 C)     Temp Source 09/23/23 0927 Oral     SpO2 09/23/23 0927 100 %     Weight 09/23/23 0930 150 lb (68 kg)     Height 09/23/23 0930 5\' 6"  (1.676 m)     Head Circumference --      Peak Flow --      Pain Score 09/23/23 0930 0     Pain Loc --      Pain Education --      Exclude from Growth Chart --     Most recent vital signs: Vitals:   09/23/23 1249 09/23/23 1422  BP: 126/87 135/89  Pulse: 80 83  Resp: 14 16  Temp: 98.4 F (36.9 C) 98.9 F (37.2 C)  SpO2: 100% 100%     Constitutional: Alert  Eyes: Conjunctivae are normal.  Head: Atraumatic. Nose: No congestion/rhinnorhea. Mouth/Throat: Mucous membranes are moist.   Neck: Painless ROM.  Cardiovascular:   Good peripheral circulation. Respiratory: Normal respiratory effort.  No retractions.  Gastrointestinal: Soft and nontender.  Musculoskeletal:  no deformity Neurologic:  MAE spontaneously. No gross focal neurologic deficits are appreciated.  Skin:  Skin is warm, dry and intact. No rash noted. Psychiatric: Mood and affect are normal. Speech and behavior are normal.    ED Results / Procedures /  Treatments   Labs (all labs ordered are listed, but only abnormal results are displayed) Labs Reviewed  BASIC METABOLIC PANEL - Abnormal; Notable for the following components:      Result Value   Potassium 3.3 (*)    Chloride 96 (*)    Glucose, Bld 185 (*)    BUN 38 (*)    Creatinine, Ser 5.15 (*)    Calcium 7.6 (*)    GFR, Estimated 11 (*)    All other components within normal limits  CBC - Abnormal; Notable for the following components:   WBC 10.8 (*)    RBC 1.79 (*)    Hemoglobin 5.4 (*)    HCT 16.9 (*)    RDW 15.8 (*)    All other components within normal limits  TYPE AND SCREEN  PREPARE RBC (CROSSMATCH)        RADIOLOGY Please see ED Course for my review and interpretation.  I personally reviewed all radiographic images ordered to evaluate for the above acute complaints and reviewed radiology reports and findings.  These findings were personally discussed with the patient.  Please see medical record for radiology  report.    PROCEDURES:  Critical Care performed: No  Procedures   MEDICATIONS ORDERED IN ED: Medications  sodium chloride flush (NS) 0.9 % injection 10 mL (10 mLs Intravenous Given 09/23/23 1241)     IMPRESSION / MDM / ASSESSMENT AND PLAN / ED COURSE  I reviewed the triage vital signs and the nursing notes.                              Differential diagnosis includes, but is not limited to, symptomatic anemia, CHF, sepsis, pneumonia  Patient presenting to the ER for evaluation of symptoms as described above.  Based on symptoms, risk factors and considered above differential, this presenting complaint could reflect a potentially life-threatening illness therefore the patient will be placed on continuous pulse oximetry and telemetry for monitoring.  Laboratory evaluation will be sent to evaluate for the above complaints.  Based on acute anemia do suspect that she is symptomatic from this she is not febrile not hypoxic have a low suspicion for  pneumonia.  Will order blood transfusion.  Likely secondary to anemia of chronic disease.   Clinical Course as of 09/23/23 1425  Thu Sep 23, 2023  1302 Patient remains hemodynamically stable.  No sign of bleeding.  Have ordered 2 units transfusion.  Will observe in the ER if remains hemodynamically stable without hypoxia will be appropriate for discharge with continued outpatient hemodialysis. [PR]    Clinical Course User Index [PR] Willy Eddy, MD     FINAL CLINICAL IMPRESSION(S) / ED DIAGNOSES   Final diagnoses:  Symptomatic anemia     Rx / DC Orders   ED Discharge Orders     None        Note:  This document was prepared using Dragon voice recognition software and may include unintentional dictation errors.    Willy Eddy, MD 09/23/23 1426

## 2023-09-23 NOTE — ED Provider Notes (Signed)
Care assumed of patient from outgoing provider.  See their note for initial history, exam and plan.  Clinical Course as of 09/23/23 1647  Thu Sep 23, 2023  1302 Patient remains hemodynamically stable.  No sign of bleeding.  Have ordered 2 units transfusion.  Will observe in the ER if remains hemodynamically stable without hypoxia will be appropriate for discharge with continued outpatient hemodialysis. [PR]  1504 ESRD on HD - no symptoms of GIB or vaginal bleeding. 2U PRBC.  Home HD. Discussed with nephrology - plan to dc after 2U.  [SM]    Clinical Course User Index [PR] Willy Eddy, MD [SM] Corena Herter, MD   Infusion complete.  Discharged in stable condition with plan to follow-up as an outpatient with nephrology for repeat labs   Corena Herter, MD 09/23/23 1647

## 2023-09-24 LAB — BPAM RBC
Blood Product Expiration Date: 202411172359
Blood Product Expiration Date: 202412022359
ISSUE DATE / TIME: 202411071228
ISSUE DATE / TIME: 202411071434
Unit Type and Rh: 5100
Unit Type and Rh: 5100

## 2023-09-24 LAB — TYPE AND SCREEN
ABO/RH(D): O POS
Antibody Screen: NEGATIVE
Unit division: 0
Unit division: 0

## 2023-10-06 ENCOUNTER — Ambulatory Visit: Payer: Medicaid Other | Admitting: Dermatology

## 2023-10-10 ENCOUNTER — Emergency Department: Payer: 59

## 2023-10-10 ENCOUNTER — Inpatient Hospital Stay
Admission: EM | Admit: 2023-10-10 | Discharge: 2023-10-12 | DRG: 871 | Disposition: A | Discharge status: Home / Self Care | Payer: 59 | Attending: Osteopathic Medicine | Admitting: Osteopathic Medicine

## 2023-10-10 ENCOUNTER — Other Ambulatory Visit: Payer: Self-pay

## 2023-10-10 DIAGNOSIS — Z1152 Encounter for screening for COVID-19: Secondary | ICD-10-CM

## 2023-10-10 DIAGNOSIS — Z992 Dependence on renal dialysis: Secondary | ICD-10-CM | POA: Diagnosis not present

## 2023-10-10 DIAGNOSIS — E1065 Type 1 diabetes mellitus with hyperglycemia: Secondary | ICD-10-CM | POA: Diagnosis present

## 2023-10-10 DIAGNOSIS — J9601 Acute respiratory failure with hypoxia: Secondary | ICD-10-CM

## 2023-10-10 DIAGNOSIS — Z888 Allergy status to other drugs, medicaments and biological substances status: Secondary | ICD-10-CM | POA: Diagnosis not present

## 2023-10-10 DIAGNOSIS — R636 Underweight: Secondary | ICD-10-CM | POA: Diagnosis present

## 2023-10-10 DIAGNOSIS — Z794 Long term (current) use of insulin: Secondary | ICD-10-CM | POA: Diagnosis not present

## 2023-10-10 DIAGNOSIS — I10 Essential (primary) hypertension: Secondary | ICD-10-CM | POA: Diagnosis present

## 2023-10-10 DIAGNOSIS — E1040 Type 1 diabetes mellitus with diabetic neuropathy, unspecified: Secondary | ICD-10-CM | POA: Diagnosis present

## 2023-10-10 DIAGNOSIS — R6520 Severe sepsis without septic shock: Secondary | ICD-10-CM | POA: Diagnosis present

## 2023-10-10 DIAGNOSIS — J189 Pneumonia, unspecified organism: Secondary | ICD-10-CM | POA: Diagnosis present

## 2023-10-10 DIAGNOSIS — I132 Hypertensive heart and chronic kidney disease with heart failure and with stage 5 chronic kidney disease, or end stage renal disease: Secondary | ICD-10-CM | POA: Diagnosis present

## 2023-10-10 DIAGNOSIS — Z87440 Personal history of urinary (tract) infections: Secondary | ICD-10-CM

## 2023-10-10 DIAGNOSIS — Z803 Family history of malignant neoplasm of breast: Secondary | ICD-10-CM | POA: Diagnosis not present

## 2023-10-10 DIAGNOSIS — R0602 Shortness of breath: Secondary | ICD-10-CM | POA: Diagnosis not present

## 2023-10-10 DIAGNOSIS — Z833 Family history of diabetes mellitus: Secondary | ICD-10-CM | POA: Diagnosis not present

## 2023-10-10 DIAGNOSIS — D631 Anemia in chronic kidney disease: Secondary | ICD-10-CM | POA: Diagnosis present

## 2023-10-10 DIAGNOSIS — A419 Sepsis, unspecified organism: Secondary | ICD-10-CM | POA: Diagnosis present

## 2023-10-10 DIAGNOSIS — E1022 Type 1 diabetes mellitus with diabetic chronic kidney disease: Secondary | ICD-10-CM | POA: Diagnosis present

## 2023-10-10 DIAGNOSIS — Z6822 Body mass index (BMI) 22.0-22.9, adult: Secondary | ICD-10-CM

## 2023-10-10 DIAGNOSIS — N2581 Secondary hyperparathyroidism of renal origin: Secondary | ICD-10-CM | POA: Diagnosis present

## 2023-10-10 DIAGNOSIS — Z801 Family history of malignant neoplasm of trachea, bronchus and lung: Secondary | ICD-10-CM | POA: Diagnosis not present

## 2023-10-10 DIAGNOSIS — Y95 Nosocomial condition: Secondary | ICD-10-CM | POA: Diagnosis present

## 2023-10-10 DIAGNOSIS — N186 End stage renal disease: Secondary | ICD-10-CM

## 2023-10-10 DIAGNOSIS — Z79899 Other long term (current) drug therapy: Secondary | ICD-10-CM

## 2023-10-10 DIAGNOSIS — E1140 Type 2 diabetes mellitus with diabetic neuropathy, unspecified: Secondary | ICD-10-CM | POA: Diagnosis present

## 2023-10-10 DIAGNOSIS — I5032 Chronic diastolic (congestive) heart failure: Secondary | ICD-10-CM | POA: Insufficient documentation

## 2023-10-10 DIAGNOSIS — E785 Hyperlipidemia, unspecified: Secondary | ICD-10-CM | POA: Diagnosis present

## 2023-10-10 DIAGNOSIS — N189 Chronic kidney disease, unspecified: Secondary | ICD-10-CM | POA: Diagnosis present

## 2023-10-10 LAB — CBC WITH DIFFERENTIAL/PLATELET
Abs Immature Granulocytes: 0.19 10*3/uL — ABNORMAL HIGH (ref 0.00–0.07)
Basophils Absolute: 0.1 10*3/uL (ref 0.0–0.1)
Basophils Relative: 0 %
Eosinophils Absolute: 0 10*3/uL (ref 0.0–0.5)
Eosinophils Relative: 0 %
HCT: 28.1 % — ABNORMAL LOW (ref 36.0–46.0)
Hemoglobin: 9.1 g/dL — ABNORMAL LOW (ref 12.0–15.0)
Immature Granulocytes: 1 %
Lymphocytes Relative: 4 %
Lymphs Abs: 1.1 10*3/uL (ref 0.7–4.0)
MCH: 29.7 pg (ref 26.0–34.0)
MCHC: 32.4 g/dL (ref 30.0–36.0)
MCV: 91.8 fL (ref 80.0–100.0)
Monocytes Absolute: 0.8 10*3/uL (ref 0.1–1.0)
Monocytes Relative: 3 %
Neutro Abs: 27.3 10*3/uL — ABNORMAL HIGH (ref 1.7–7.7)
Neutrophils Relative %: 92 %
Platelets: 305 10*3/uL (ref 150–400)
RBC: 3.06 MIL/uL — ABNORMAL LOW (ref 3.87–5.11)
RDW: 15.3 % (ref 11.5–15.5)
Smear Review: NORMAL
WBC: 29.4 10*3/uL — ABNORMAL HIGH (ref 4.0–10.5)
nRBC: 0 % (ref 0.0–0.2)

## 2023-10-10 LAB — COMPREHENSIVE METABOLIC PANEL
ALT: 18 U/L (ref 0–44)
AST: 27 U/L (ref 15–41)
Albumin: 3.3 g/dL — ABNORMAL LOW (ref 3.5–5.0)
Alkaline Phosphatase: 105 U/L (ref 38–126)
Anion gap: 20 — ABNORMAL HIGH (ref 5–15)
BUN: 54 mg/dL — ABNORMAL HIGH (ref 6–20)
CO2: 18 mmol/L — ABNORMAL LOW (ref 22–32)
Calcium: 8.4 mg/dL — ABNORMAL LOW (ref 8.9–10.3)
Chloride: 95 mmol/L — ABNORMAL LOW (ref 98–111)
Creatinine, Ser: 7.76 mg/dL — ABNORMAL HIGH (ref 0.44–1.00)
GFR, Estimated: 7 mL/min — ABNORMAL LOW (ref >60)
Glucose, Bld: 222 mg/dL — ABNORMAL HIGH (ref 70–99)
Potassium: 3.7 mmol/L (ref 3.5–5.1)
Sodium: 133 mmol/L — ABNORMAL LOW (ref 135–145)
Total Bilirubin: 1.6 mg/dL — ABNORMAL HIGH (ref <1.2)
Total Protein: 8.7 g/dL — ABNORMAL HIGH (ref 6.5–8.1)

## 2023-10-10 LAB — LACTIC ACID, PLASMA
Lactic Acid, Venous: 2.2 mmol/L (ref 0.5–1.9)
Lactic Acid, Venous: 1.5 mmol/L (ref 0.5–1.9)

## 2023-10-10 LAB — PROCALCITONIN: Procalcitonin: 1.78 ng/mL

## 2023-10-10 LAB — RESP PANEL BY RT-PCR (RSV, FLU A&B, COVID)  RVPGX2
Influenza A by PCR: NEGATIVE
Influenza B by PCR: NEGATIVE
Resp Syncytial Virus by PCR: NEGATIVE
SARS Coronavirus 2 by RT PCR: NEGATIVE

## 2023-10-10 LAB — PROTIME-INR
INR: 1.2 (ref 0.8–1.2)
Prothrombin Time: 15.5 s — ABNORMAL HIGH (ref 11.4–15.2)

## 2023-10-10 MED ORDER — ONDANSETRON HCL 4 MG/2ML IJ SOLN
4.0000 mg | Freq: Four times a day (QID) | INTRAMUSCULAR | Status: DC | PRN
  Administered 2023-10-12: 4 mg via INTRAVENOUS
  Filled 2023-10-10: qty 2

## 2023-10-10 MED ORDER — HEPARIN SODIUM (PORCINE) 5000 UNIT/ML IJ SOLN
5000.0000 [IU] | Freq: Three times a day (TID) | INTRAMUSCULAR | Status: DC
  Administered 2023-10-11 – 2023-10-12 (×3): 5000 [IU] via SUBCUTANEOUS
  Filled 2023-10-10 (×4): qty 1

## 2023-10-10 MED ORDER — HYDROCODONE-ACETAMINOPHEN 5-325 MG PO TABS
1.0000 | ORAL_TABLET | ORAL | Status: DC | PRN
  Administered 2023-10-11: 2 via ORAL
  Filled 2023-10-10: qty 2

## 2023-10-10 MED ORDER — GUAIFENESIN-CODEINE 100-10 MG/5ML PO SOLN: 5.0000 mL | Freq: Two times a day (BID) | ORAL | Status: DC | PRN

## 2023-10-10 MED ORDER — ONDANSETRON HCL 4 MG/2ML IJ SOLN
4.0000 mg | Freq: Once | INTRAMUSCULAR | Status: AC
  Administered 2023-10-10: 4 mg via INTRAVENOUS
  Filled 2023-10-10: qty 2

## 2023-10-10 MED ORDER — SODIUM CHLORIDE 0.9 % IV BOLUS (SEPSIS)
500.0000 mL | Freq: Once | INTRAVENOUS | Status: AC
  Administered 2023-10-10: 500 mL via INTRAVENOUS

## 2023-10-10 MED ORDER — LACTATED RINGERS IV SOLN: INTRAVENOUS | Status: DC

## 2023-10-10 MED ORDER — ACETAMINOPHEN 325 MG PO TABS: 650.0000 mg | ORAL_TABLET | Freq: Four times a day (QID) | ORAL | Status: DC | PRN

## 2023-10-10 MED ORDER — GABAPENTIN 300 MG PO CAPS: 300.0000 mg | ORAL_CAPSULE | Freq: Three times a day (TID) | ORAL | Status: DC | PRN

## 2023-10-10 MED ORDER — VANCOMYCIN HCL 1250 MG/250ML IV SOLN
1250.0000 mg | Freq: Once | INTRAVENOUS | Status: DC
  Filled 2023-10-10: qty 250

## 2023-10-10 MED ORDER — INSULIN ASPART 100 UNIT/ML IJ SOLN
0.0000 [IU] | Freq: Every day | INTRAMUSCULAR | Status: DC
  Administered 2023-10-11: 2 [IU] via SUBCUTANEOUS
  Filled 2023-10-10 (×2): qty 1

## 2023-10-10 MED ORDER — METRONIDAZOLE 500 MG/100ML IV SOLN
500.0000 mg | Freq: Once | INTRAVENOUS | Status: AC
  Administered 2023-10-10: 500 mg via INTRAVENOUS
  Filled 2023-10-10: qty 100

## 2023-10-10 MED ORDER — MORPHINE SULFATE (PF) 4 MG/ML IV SOLN
4.0000 mg | Freq: Once | INTRAVENOUS | Status: AC
  Administered 2023-10-10: 4 mg via INTRAVENOUS
  Filled 2023-10-10: qty 1

## 2023-10-10 MED ORDER — INSULIN ASPART 100 UNIT/ML IJ SOLN
0.0000 [IU] | Freq: Three times a day (TID) | INTRAMUSCULAR | Status: DC
  Administered 2023-10-12: 5 [IU] via SUBCUTANEOUS
  Administered 2023-10-12: 3 [IU] via SUBCUTANEOUS
  Filled 2023-10-10 (×2): qty 1

## 2023-10-10 MED ORDER — VANCOMYCIN HCL IN DEXTROSE 1-5 GM/200ML-% IV SOLN
1000.0000 mg | Freq: Once | INTRAVENOUS | Status: DC
  Filled 2023-10-10: qty 200

## 2023-10-10 MED ORDER — ACETAMINOPHEN 650 MG RE SUPP: 650.0000 mg | Freq: Four times a day (QID) | RECTAL | Status: DC | PRN

## 2023-10-10 MED ORDER — VANCOMYCIN HCL 1500 MG/300ML IV SOLN
1500.0000 mg | Freq: Once | INTRAVENOUS | Status: AC
Start: 2023-10-11 — End: 2023-10-11
  Administered 2023-10-11: 1500 mg via INTRAVENOUS
  Filled 2023-10-10: qty 300

## 2023-10-10 MED ORDER — METOPROLOL TARTRATE 25 MG PO TABS
25.0000 mg | ORAL_TABLET | Freq: Two times a day (BID) | ORAL | Status: DC
  Filled 2023-10-10: qty 1

## 2023-10-10 MED ORDER — SODIUM CHLORIDE 0.9 % IV SOLN
2.0000 g | Freq: Once | INTRAVENOUS | Status: AC
  Administered 2023-10-10: 2 g via INTRAVENOUS
  Filled 2023-10-10: qty 12.5

## 2023-10-10 MED ORDER — INSULIN GLARGINE-YFGN 100 UNIT/ML ~~LOC~~ SOLN
6.0000 [IU] | Freq: Every day | SUBCUTANEOUS | Status: DC
  Administered 2023-10-11 – 2023-10-12 (×2): 6 [IU] via SUBCUTANEOUS
  Filled 2023-10-10 (×2): qty 0.06

## 2023-10-10 MED ORDER — ATORVASTATIN CALCIUM 20 MG PO TABS
80.0000 mg | ORAL_TABLET | Freq: Every day | ORAL | Status: DC
  Administered 2023-10-11 – 2023-10-12 (×2): 80 mg via ORAL
  Filled 2023-10-10 (×2): qty 4

## 2023-10-10 MED ORDER — ONDANSETRON HCL 4 MG PO TABS: 4.0000 mg | ORAL_TABLET | Freq: Four times a day (QID) | ORAL | Status: DC | PRN

## 2023-10-10 NOTE — ED Notes (Signed)
Patient aware that we need a urine sample.

## 2023-10-10 NOTE — Assessment & Plan Note (Signed)
Diabetic neuropathy Blood sugar elevated over 200 Basal insulin with sliding scale coverage Gabapentin if needed for neuropathic pain

## 2023-10-10 NOTE — Assessment & Plan Note (Signed)
Clinically euvolemic Continue metoprolol.  Will hold Bumex in the setting of sepsis Daily weights

## 2023-10-10 NOTE — ED Provider Notes (Signed)
[facility] Provider Note   Event Date/Time   First MD Initiated Contact with Patient 10/10/23 2037     (approximate) History  Shortness of Breath  HPI Emma Thomas is a 27 y.o. female with a past medical history of end-stage renal disease on dialysis Monday/Wednesday/Friday who presents for worsening cough, fever, and generalized pain over the last 3 days.  Patient states that she began having a cough 3 days prior to arrival, fever today, and vomiting that started 2 days ago.  ROS: Patient currently denies any vision changes, tinnitus, difficulty speaking, facial droop, sore throat, diarrhea, dysuria, or weakness/numbness/paresthesias in any extremity   Physical Exam  Triage Vital Signs: ED Triage Vitals  Encounter Vitals Group     BP 10/10/23 2030 (!) 156/97     Systolic BP Percentile --      Diastolic BP Percentile --      Pulse Rate 10/10/23 2030 (!) 107     Resp 10/10/23 2030 19     Temp 10/10/23 2030 (!) 100.4 F (38 C)     Temp Source 10/10/23 2030 Oral     SpO2 10/10/23 2030 92 %     Weight 10/10/23 2028 140 lb (63.5 kg)     Height 10/10/23 2028 5\' 6"  (1.676 m)     Head Circumference --      Peak Flow --      Pain Score 10/10/23 2028 9     Pain Loc --      Pain Education --      Exclude from Growth Chart --    Most recent vital signs: Vitals:   10/10/23 2230 10/10/23 2300  BP: (!) 155/97 (!) 150/94  Pulse: 97 95  Resp: (!) 29 (!) 27  Temp:    SpO2: 90% (!) 89%   General: Awake, oriented x4. CV:  Good peripheral perfusion.  Resp:  Increased effort.  Rhonchi over bilateral lung fields Abd:  No distention.  Other:  Middle-aged well-developed, well-nourished African-American female laying in stretcher in mild respiratory distress ED Results / Procedures / Treatments  Labs (all labs ordered are listed, but only abnormal results are displayed) Labs Reviewed  COMPREHENSIVE METABOLIC PANEL - Abnormal; Notable for the following  components:      Result Value   Sodium 133 (*)    Chloride 95 (*)    CO2 18 (*)    Glucose, Bld 222 (*)    BUN 54 (*)    Creatinine, Ser 7.76 (*)    Calcium 8.4 (*)    Total Protein 8.7 (*)    Albumin 3.3 (*)    Total Bilirubin 1.6 (*)    GFR, Estimated 7 (*)    Anion gap 20 (*)    All other components within normal limits  LACTIC ACID, PLASMA - Abnormal; Notable for the following components:   Lactic Acid, Venous 2.2 (*)    All other components within normal limits  CBC WITH DIFFERENTIAL/PLATELET - Abnormal; Notable for the following components:   WBC 29.4 (*)    RBC 3.06 (*)    Hemoglobin 9.1 (*)    HCT 28.1 (*)    Neutro Abs 27.3 (*)    Abs Immature Granulocytes 0.19 (*)    All other components within normal limits  PROTIME-INR - Abnormal; Notable for the following components:   Prothrombin Time 15.5 (*)    All other components within normal limits  RESP PANEL BY RT-PCR (RSV, FLU A&B, COVID)  RVPGX2  CULTURE, BLOOD (ROUTINE X 2)  CULTURE, BLOOD (ROUTINE X 2)  LACTIC ACID, PLASMA  PROCALCITONIN  URINALYSIS, W/ REFLEX TO CULTURE (INFECTION SUSPECTED)  CBC  BASIC METABOLIC PANEL  HCG, QUANTITATIVE, PREGNANCY  POC URINE PREG, ED   EKG ED ECG REPORT I, Merwyn Katos, the attending physician, personally viewed and interpreted this ECG. Date: 10/10/2023 EKG Time: 2034 Rate: 106 Rhythm: Tachycardic sinus rhythm QRS Axis: normal Intervals: normal ST/T Wave abnormalities: normal Narrative Interpretation: Tachycardic sinus rhythm.  No evidence of acute ischemia RADIOLOGY ED MD interpretation: 2 view chest x-ray interpreted independently shows bilateral airspace opacities likely reflecting multifocal pneumonia -Agree with radiology assessment Official radiology report(s): DG Chest 2 View  Result Date: 10/10/2023 CLINICAL DATA:  Shortness of breath. EXAM: CHEST - 2 VIEW COMPARISON:  Chest radiograph dated 09/23/2023. FINDINGS: The heart size and mediastinal contours  are within normal limits. There are patchy bilateral airspace opacities, most significant in the right lower lung and left mid lung. Trace pleural effusions may contribute. No pneumothorax. The visualized skeletal structures are unremarkable. IMPRESSION: Patchy bilateral airspace opacities may reflect multifocal pneumonia. Electronically Signed   By: Romona Curls M.D.   On: 10/10/2023 21:26   PROCEDURES: Critical Care performed: Yes, see critical care procedure note(s) Procedures MEDICATIONS ORDERED IN ED: Medications  lactated ringers infusion ( Intravenous New Bag/Given 10/10/23 2225)  vancomycin (VANCOREADY) IVPB 1250 mg/250 mL (has no administration in time range)  atorvastatin (LIPITOR) tablet 80 mg (has no administration in time range)  metoprolol tartrate (LOPRESSOR) tablet 25 mg (has no administration in time range)  guaiFENesin-codeine 100-10 MG/5ML solution 5 mL (has no administration in time range)  insulin glargine-yfgn (SEMGLEE) injection 6 Units (has no administration in time range)  insulin aspart (novoLOG) injection 0-6 Units (has no administration in time range)  insulin aspart (novoLOG) injection 0-5 Units (has no administration in time range)  heparin injection 5,000 Units (has no administration in time range)  acetaminophen (TYLENOL) tablet 650 mg (has no administration in time range)    Or  acetaminophen (TYLENOL) suppository 650 mg (has no administration in time range)  HYDROcodone-acetaminophen (NORCO/VICODIN) 5-325 MG per tablet 1-2 tablet (has no administration in time range)  ondansetron (ZOFRAN) tablet 4 mg (has no administration in time range)    Or  ondansetron (ZOFRAN) injection 4 mg (has no administration in time range)  gabapentin (NEURONTIN) capsule 300 mg (has no administration in time range)  sodium chloride 0.9 % bolus 500 mL (0 mLs Intravenous Stopped 10/10/23 2216)  ceFEPIme (MAXIPIME) 2 g in sodium chloride 0.9 % 100 mL IVPB (0 g Intravenous Stopped  10/10/23 2209)  metroNIDAZOLE (FLAGYL) IVPB 500 mg (0 mg Intravenous Stopped 10/10/23 2335)  ondansetron (ZOFRAN) injection 4 mg (4 mg Intravenous Given 10/10/23 2144)  morphine (PF) 4 MG/ML injection 4 mg (4 mg Intravenous Given 10/10/23 2144)   IMPRESSION / MDM / ASSESSMENT AND PLAN / ED COURSE  I reviewed the triage vital signs and the nursing notes.                             The patient is on the cardiac monitor to evaluate for evidence of arrhythmia and/or significant heart rate changes. Patient's presentation is most consistent with acute presentation with potential threat to life or bodily function. The Pt presents with fever and tachypnea highly concerning for sepsis (suspected pulmonary source). At this time, the Pt is satting well on room air, normotensive,  and appears HDS.  Will start empiric antibiotics and fluids.  Due to ESRD on dialysis, will administer fluids gradually with frequent reassessment. Have low suspicion for a GI, skin/soft tissue, or CNS source at this time, but will reconsider if initial workup is unremarkable.  - CBC, BMP, LFTs - VBG - UA - BCx x2, Lactate - EKG - CXR - Empiric Abx: Cefepime and vancomycin - Fluids: 500 cc NS  Dispo: Admit to medicine   FINAL CLINICAL IMPRESSION(S) / ED DIAGNOSES   Final diagnoses:  Multifocal pneumonia  Sepsis with acute hypoxic respiratory failure without septic shock, due to unspecified organism [facility])   Rx / DC Orders   ED Discharge Orders     None      Note:  This document was prepared using Dragon voice recognition software and may include unintentional dictation errors.   Merwyn Katos, MD 10/10/23 [phone]

## 2023-10-10 NOTE — Sepsis Progress Note (Signed)
Elink following this code sepsis.

## 2023-10-10 NOTE — ED Notes (Signed)
Pt desating @ 89% in 2L . Pt oxygen turned up to 3L. Pt sating @ 100% ON 3L .

## 2023-10-10 NOTE — ED Notes (Signed)
Lab called at this time for Procalcitonin. Lab stated that they were waiting for sample to come off machine to see if they had enough to run it.

## 2023-10-10 NOTE — ED Notes (Signed)
Asked Luisa Hart RN to help with an IV.

## 2023-10-10 NOTE — Assessment & Plan Note (Signed)
Continue metoprolol and amlodipine

## 2023-10-10 NOTE — Assessment & Plan Note (Signed)
Patient does home hemodialysis Nephrology consulted for continuation of dialysis

## 2023-10-10 NOTE — Assessment & Plan Note (Signed)
Hemoglobin at baseline

## 2023-10-10 NOTE — H&P (Signed)
History and Physical    Patient: Emma Thomas:[id number] DOB: [birth year] DOA: 10/10/2023 DOS: the patient was seen and examined on 10/10/2023 PCP: [name]  Patient coming from: Home  Chief Complaint:  Chief Complaint  Patient presents with   Shortness of Breath    HPI: Emma Thomas is a 27 y.o. female with medical history significant for ESRD on home hemodialysis, anemia of CKD, HTN, type 1 diabetes, HLD, prior history of osteomyelitis, hospitalized a month ago, 10/6 - 08/26/2023 with severe sepsis secondary to CAP, who presents to the ED with cough, shortness of breath and fevers.  Her last hemodialysis session was 2 days ago on 11/22.  She denies chest pain or lower extremity pain or swelling. ED course and data reviewed: On arrival temperature 100.4, tachycardic to 107, BP 156/97 and O2 sat down to 89% requiring 42L. Labs: WBC 29,000 with lactic acid 1.5 and procalcitonin 1.78 Respiratory viral panel negative for COVID, flu and RSV Hemoglobin at baseline at 9.1 Glucose 222, bicarb 18, total bili 1.6 EKG, personally viewed and interpreted showing sinus at 97 with no acute ST-T wave changes Chest x-ray consistent with multifocal pneumonia Patient given a 500 NS bolus, started on cefepime and vancomycin and Flagyl Hospitalist consulted for admission for sepsis secondary to pneumonia   Review of Systems: As mentioned in the history of present illness. All other systems reviewed and are negative.  Past Medical History:  Diagnosis Date   Anemia    Diabetes mellitus without complication (HCC)    Type 1 DM   Essential hypertension    Headache    Hypertension 03/04/2013   Neurologic disorder    Both feet   Neuromuscular disorder (HCC)    Recurrent UTI    Renal disorder    Past Surgical History:  Procedure Laterality Date   abscess removal     excision of bartholin cyst   AMPUTATION Left 11/23/2022   Procedure: LEFT GREAT TOE PARTIAL RAY  AMPUTATION;  Surgeon: Linus Galas, DPM;  Location: [facility];  Service: Podiatry;  Laterality: Left;   AMPUTATION Left 07/09/2023   Procedure: AMPUTATION [place];  Surgeon: Rosetta Posner, DPM;  Location: [facility];  Service: Orthopedics/Podiatry;  Laterality: Left;   CAPD INSERTION N/A 06/09/2022   Procedure: LAPAROSCOPIC INSERTION CONTINUOUS AMBULATORY PERITONEAL DIALYSIS  (CAPD) CATHETER, PD rep to be present;  Surgeon: Henrene Dodge, MD;  Location: [facility];  Service: General;  Laterality: N/A;   CAPD INSERTION N/A 12/15/2022   Procedure: LAPAROSCOPIC INSERTION CONTINUOUS AMBULATORY PERITONEAL DIALYSIS  (CAPD) CATHETER;  Surgeon: Henrene Dodge, MD;  Location: [facility];  Service: General;  Laterality: N/A;   CAPD REMOVAL N/A 12/15/2022   Procedure: LAPAROSCOPIC REMOVAL CONTINUOUS AMBULATORY PERITONEAL DIALYSIS  (CAPD) CATHETER, removal of old catheter;  Surgeon: Henrene Dodge, MD;  Location: [facility];  Service: General;  Laterality: N/A;   CAPD REMOVAL N/A 01/08/2023   Procedure: LAPAROSCOPIC REMOVAL CONTINUOUS AMBULATORY PERITONEAL DIALYSIS  (CAPD) CATHETER;  Surgeon: Henrene Dodge, MD;  Location: [facility];  Service: General;  Laterality: N/A;   CAPD REVISION N/A 07/16/2022   Procedure: LAPAROSCOPIC REVISION CONTINUOUS AMBULATORY PERITONEAL DIALYSIS  (CAPD) CATHETER;  Surgeon: Henrene Dodge, MD;  Location: [facility];  Service: General;  Laterality: N/A;   CAPD REVISION N/A 11/24/2022   Procedure: LAPAROSCOPIC REVISION CONTINUOUS AMBULATORY PERITONEAL DIALYSIS  (CAPD) CATHETER;  Surgeon: Henrene Dodge, MD;  Location: [facility];  Service: General;  Laterality: N/A;   DIALYSIS/PERMA CATHETER INSERTION N/A 04/24/2022  Procedure: DIALYSIS/PERMA CATHETER INSERTION;  Surgeon: Renford Dills, MD;  Location: [facility];  Service: Cardiovascular;  Laterality: N/A;   DIALYSIS/PERMA CATHETER REMOVAL N/A 06/02/2022   Procedure: DIALYSIS/PERMA CATHETER REMOVAL;  Surgeon: Renford Dills, MD;   Location: [facility];  Service: Cardiovascular;  Laterality: N/A;   DIALYSIS/PERMA CATHETER REMOVAL N/A 12/28/2022   Procedure: DIALYSIS/PERMA CATHETER REMOVAL;  Surgeon: Annice Needy, MD;  Location: [facility];  Service: Cardiovascular;  Laterality: N/A;   INSERTION OF DIALYSIS CATHETER Right    ARM   IR FLUORO GUIDE CV LINE RIGHT  11/27/2022   IR US GUIDE VASC ACCESS RIGHT  11/27/2022   IRRIGATION AND DEBRIDEMENT FOOT Left 06/19/2023   Procedure: IRRIGATION AND DEBRIDEMENT FOOT PARTIAL 1ST RAY AMPUTATION;  Surgeon: Rosetta Posner, DPM;  Location: [facility];  Service: Orthopedics/Podiatry;  Laterality: Left;   IRRIGATION AND DEBRIDEMENT FOOT Left 07/09/2023   Procedure: IRRIGATION AND DEBRIDEMENT FOOT;  Surgeon: Rosetta Posner, DPM;  Location: [facility];  Service: Orthopedics/Podiatry;  Laterality: Left;   Nexplanon  01/2011   UMBILICAL HERNIA REPAIR N/A 06/09/2022   Procedure: HERNIA REPAIR UMBILICAL ADULT;  Surgeon: Henrene Dodge, MD;  Location: [facility];  Service: General;  Laterality: N/A;   Social History:  reports that she has never smoked. She has been exposed to tobacco smoke. She has never used smokeless tobacco. She reports that she does not currently use alcohol after a past usage of about 2.0 standard drinks of alcohol per week. She reports that she does not use drugs.  Allergies  Allergen Reactions   Losartan Swelling    Angioedema of tongue    Family History  Problem Relation Age of Onset   Breast cancer Mother 61   Lung cancer Maternal Grandmother    Diabetes type II Paternal Grandmother     Prior to Admission medications   Medication Sig Start Date End Date Taking? Authorizing Provider  acetaminophen (TYLENOL) 500 MG tablet Take 2 tablets (1,000 mg total) by mouth every 6 (six) hours as needed for mild pain. 01/08/23   Henrene Dodge, MD  amLODipine (NORVASC) 10 MG tablet Take 1 tablet (10 mg total) by mouth daily. 04/28/22   Lurene Shadow, MD  atorvastatin  (LIPITOR) 80 MG tablet Take 80 mg by mouth daily.    [provider]  azithromycin (ZITHROMAX) 250 MG tablet One tab po daily for one more day 08/27/23   Alford Highland, MD  bumetanide (BUMEX) 1 MG tablet Take 1 mg by mouth 2 (two) times daily.    [provider]  guaiFENesin-codeine 100-10 MG/5ML syrup Take 5 mLs by mouth every 12 (twelve) hours as needed for cough (cough not responding to tessalon). 08/26/23   Alford Highland, MD  HUMALOG 100 UNIT/ML injection Inject 0-1 mLs (0-100 Units total) into the skin daily. Uses with Insulin Pump 04/09/23   Kathlen Mody, MD  metoprolol tartrate (LOPRESSOR) 25 MG tablet Take 1 tablet (25 mg total) by mouth 2 (two) times daily. 08/26/23 09/25/23  Alford Highland, MD  montelukast (SINGULAIR) 10 MG tablet Take 10 mg by mouth at bedtime.    [provider]  norethindrone-ethinyl estradiol (LOESTRIN) 1-20 MG-MCG tablet Take 1 tablet by mouth daily.    [provider]  ondansetron (ZOFRAN) 4 MG tablet Take 1 tablet (4 mg total) by mouth every 8 (eight) hours as needed for nausea or vomiting. 08/26/23   Alford Highland, MD  oxyCODONE-acetaminophen (PERCOCET) 5-325 MG tablet Take 1 tablet by  mouth every 4 (four) hours as needed. 08/27/23 08/26/24  Willy Eddy, MD  pantoprazole (PROTONIX) 40 MG tablet Take 1 tablet (40 mg total) by mouth daily. 08/26/23 09/25/23  Alford Highland, MD  promethazine (PHENERGAN) 12.5 MG tablet Take 1 tablet (12.5 mg total) by mouth every 6 (six) hours as needed for nausea or vomiting. 08/27/23   Willy Eddy, MD  SUMAtriptan (IMITREX) 50 MG tablet Take 50 mg by mouth daily as needed for migraine.    [provider]    Physical Exam: Vitals:   10/10/23 2121 10/10/23 2140 10/10/23 2230 10/10/23 2300  BP: (!) 163/106  (!) 155/97 (!) 150/94  Pulse:   97 95  Resp:   (!) 29 (!) 27  Temp:  99.4 F (37.4 C)    TempSrc:  Oral    SpO2:   90% (!) 89%  Weight:      Height:        Physical Exam Vitals and nursing note reviewed.  Constitutional:      General: She is not in acute distress.    Interventions: Nasal cannula in place.     Comments: Conversational dyspnea  HENT:     Head: Normocephalic and atraumatic.  Cardiovascular:     Rate and Rhythm: Regular rhythm. Tachycardia present.     Heart sounds: Normal heart sounds.  Pulmonary:     Effort: Tachypnea present.     Breath sounds: Wheezing present.  Abdominal:     Palpations: Abdomen is soft.     Tenderness: There is no abdominal tenderness.  Neurological:     Mental Status: Mental status is at baseline.     Labs on Admission: I have personally reviewed following labs and imaging studies  CBC: Recent Labs  Lab 10/10/23 2057  WBC 29.4*  NEUTROABS 27.3*  HGB 9.1*  HCT 28.1*  MCV 91.8  PLT 305   Basic Metabolic Panel: Recent Labs  Lab 10/10/23 2057  NA 133*  K 3.7  CL 95*  CO2 18*  GLUCOSE 222*  BUN 54*  CREATININE 7.76*  CALCIUM 8.4*   GFR: Estimated Creatinine Clearance: 10 mL/min (A) (by C-G formula based on SCr of 7.76 mg/dL (H)). Liver Function Tests: Recent Labs  Lab 10/10/23 2057  AST 27  ALT 18  ALKPHOS 105  BILITOT 1.6*  PROT 8.7*  ALBUMIN 3.3*   No results for input(s): "LIPASE", "AMYLASE" in the last 168 hours. No results for input(s): "AMMONIA" in the last 168 hours. Coagulation Profile: Recent Labs  Lab 10/10/23 2057  INR 1.2   Cardiac Enzymes: No results for input(s): "CKTOTAL", "CKMB", "CKMBINDEX", "TROPONINI" in the last 168 hours. BNP (last 3 results) No results for input(s): "PROBNP" in the last 8760 hours. HbA1C: No results for input(s): "HGBA1C" in the last 72 hours. CBG: No results for input(s): "GLUCAP" in the last 168 hours. Lipid Profile: No results for input(s): "CHOL", "HDL", "LDLCALC", "TRIG", "CHOLHDL", "LDLDIRECT" in the last 72 hours. Thyroid Function Tests: No results for input(s): "TSH", "T4TOTAL", "FREET4", "T3FREE",  "THYROIDAB" in the last 72 hours. Anemia Panel: No results for input(s): "VITAMINB12", "FOLATE", "FERRITIN", "TIBC", "IRON", "RETICCTPCT" in the last 72 hours. Urine analysis:    Component Value Date/Time   COLORURINE YELLOW (A) 08/13/2023 1018   APPEARANCEUR CLOUDY (A) 08/13/2023 1018   LABSPEC 1.017 08/13/2023 1018   PHURINE 6.0 08/13/2023 1018   GLUCOSEU 50 (A) 08/13/2023 1018   HGBUR SMALL (A) 08/13/2023 1018   BILIRUBINUR NEGATIVE 08/13/2023 1018   KETONESUR 5 (  A) 08/13/2023 1018   PROTEINUR >=300 (A) 08/13/2023 1018   NITRITE NEGATIVE 08/13/2023 1018   LEUKOCYTESUR TRACE (A) 08/13/2023 1018    Radiological Exams on Admission: DG Chest 2 View  Result Date: 10/10/2023 CLINICAL DATA:  Shortness of breath. EXAM: CHEST - 2 VIEW COMPARISON:  Chest radiograph dated 09/23/2023. FINDINGS: The heart size and mediastinal contours are within normal limits. There are patchy bilateral airspace opacities, most significant in the right lower lung and left mid lung. Trace pleural effusions may contribute. No pneumothorax. The visualized skeletal structures are unremarkable. IMPRESSION: Patchy bilateral airspace opacities may reflect multifocal pneumonia. Electronically Signed   By: Romona Curls M.D.   On: 10/10/2023 21:26     Data Reviewed: Relevant notes from primary care and specialist visits, past discharge summaries as available in EHR, including Care Everywhere. Prior diagnostic testing as pertinent to current admission diagnoses Updated medications and problem lists for reconciliation ED course, including vitals, labs, imaging, treatment and response to treatment Triage notes, nursing and pharmacy notes and ED provider's notes Notable results as noted in HPI   Assessment and Plan: HCAP (healthcare-associated pneumonia) Sepsis Acute respiratory failure with hypoxia Patient hospitalized a month ago with sepsis secondary to CAP, now presenting cough shortness of breath and  fever Sepsis criteria include fever, tachycardia, hypoxia, leukocytosis, pneumonia on chest x-ray Was hypoxic to 89 requiring 2 L to maintain sats in the mid 90s Vancomycin and cefepime Cautious sepsis fluids given dialysis status Antitussives, albuterol as needed Incentive spirometer Continue supplemental oxygen and wean as tolerated Follow blood cultures-at risk for bacteremia as patient is on home hemodialysis  Uncontrolled type 1 diabetes mellitus with hyperglycemia, with long-term current use of insulin (HCC) Diabetic neuropathy Blood sugar elevated over 200 Basal insulin with sliding scale coverage Gabapentin if needed for neuropathic pain  Primary hypertension Continue metoprolol and amlodipine  ESRD on home hemodialysis [facility]) Patient does home hemodialysis Nephrology consulted for continuation of dialysis  Anemia due to chronic kidney disease Hemoglobin at baseline  Chronic diastolic CHF (congestive heart failure) (HCC) Clinically euvolemic Continue metoprolol.  Will hold Bumex in the setting of sepsis Daily weights    DVT prophylaxis: heparin  Consults: renal  Advance Care Planning:   Code Status: Prior   Family Communication: none  Disposition Plan: Back to previous home environment  Severity of Illness: The appropriate patient status for this patient is INPATIENT. Inpatient status is judged to be reasonable and necessary in order to provide the required intensity of service to ensure the patient's safety. The patient's presenting symptoms, physical exam findings, and initial radiographic and laboratory data in the context of their chronic comorbidities is felt to place them at high risk for further clinical deterioration. Furthermore, it is not anticipated that the patient will be medically stable for discharge from the hospital within 2 midnights of admission.   * I certify that at the point of admission it is my clinical judgment that the patient will  require inpatient hospital care spanning beyond 2 midnights from the point of admission due to high intensity of service, high risk for further deterioration and high frequency of surveillance required.*  Author: Andris Baumann, MD 10/10/2023 11:31 PM  For on call review [email or web address].

## 2023-10-10 NOTE — ED Triage Notes (Signed)
Pt sts that she does HD at home on Friday and since than she has been SOB. Pt sts that she also has been having cough and fevers with it also.

## 2023-10-10 NOTE — ED Notes (Addendum)
Attempted IV x1 unable to advance catheter. Able to get blood work (lactic, lav, blue, lt green, and 1st set of [facility]) but could not keep as a functioning IV. Pt is a hard stick. Has been stuck x2. Only left arm can be used.

## 2023-10-10 NOTE — Consult Note (Signed)
ED Pharmacy Antibiotic Sign Off An antibiotic consult was received from an ED provider for vancomycin, cefepime per pharmacy dosing for sepsis. A chart review was completed to assess appropriateness.   The following one time order(s) were placed: cefepime 2g, vancomycin 1250 mg  Further antibiotic and/or antibiotic pharmacy consults should be ordered by the admitting provider if indicated.   Thank you for allowing pharmacy to be a part of this patient's care.   Celene Squibb, PharmD Clinical Pharmacist 10/10/2023 9:19 PM

## 2023-10-10 NOTE — Assessment & Plan Note (Addendum)
Sepsis Acute respiratory failure with hypoxia Patient hospitalized a month ago with sepsis secondary to CAP, now presenting cough shortness of breath and fever Sepsis criteria include fever, tachycardia, hypoxia, leukocytosis, pneumonia on chest x-ray Was hypoxic to 89 requiring 2 L to maintain sats in the mid 90s Vancomycin and cefepime Cautious sepsis fluids given dialysis status Antitussives, albuterol as needed Incentive spirometer Continue supplemental oxygen and wean as tolerated Follow blood cultures-at risk for bacteremia as patient is on home hemodialysis

## 2023-10-10 NOTE — Consult Note (Signed)
CODE SEPSIS - PHARMACY COMMUNICATION  **Broad Spectrum Antibiotics should be administered within 1 hour of Sepsis diagnosis**  Time Code Sepsis Called/Page Received: 2116  Antibiotics Ordered: vancomycin, cefepime, metronidazole  Time of 1st antibiotic administration: 2131  Additional action taken by pharmacy: N/A  Celene Squibb, PharmD Clinical Pharmacist 10/10/2023 9:20 PM

## 2023-10-10 NOTE — Progress Notes (Signed)
CODE SEPSIS - PHARMACY COMMUNICATION  **Broad Spectrum Antibiotics should be administered within 1 hour of Sepsis diagnosis**  Time Code Sepsis Called/Page Received: 11/24 @ 2115   Antibiotics Ordered: Cefepime , Vanc   Time of 1st antibiotic administration: Cefepime 2 gm IV X 1 on 11/24 @ 2131  Additional action taken by pharmacy:   If necessary, Name of Provider/Nurse Contacted:     Jye Fariss D ,PharmD Clinical Pharmacist  10/10/2023  9:37 PM

## 2023-10-11 ENCOUNTER — Encounter: Payer: Self-pay | Admitting: Osteopathic Medicine

## 2023-10-11 DIAGNOSIS — R0602 Shortness of breath: Secondary | ICD-10-CM | POA: Diagnosis not present

## 2023-10-11 DIAGNOSIS — J189 Pneumonia, unspecified organism: Secondary | ICD-10-CM | POA: Diagnosis not present

## 2023-10-11 LAB — URINALYSIS, W/ REFLEX TO CULTURE (INFECTION SUSPECTED)
Bilirubin Urine: NEGATIVE
Glucose, UA: 150 mg/dL — AB
Ketones, ur: 5 mg/dL — AB
Leukocytes,Ua: NEGATIVE
Nitrite: NEGATIVE
Protein, ur: >=300 mg/dL — AB
Specific Gravity, Urine: 1.016 (ref 1.005–1.030)
pH: 6 (ref 5.0–8.0)

## 2023-10-11 LAB — CBC
HCT: 21.2 % — ABNORMAL LOW (ref 36.0–46.0)
Hemoglobin: 6.9 g/dL — ABNORMAL LOW (ref 12.0–15.0)
MCH: 30.3 pg (ref 26.0–34.0)
MCHC: 32.5 g/dL (ref 30.0–36.0)
MCV: 93 fL (ref 80.0–100.0)
Platelets: 271 10*3/uL (ref 150–400)
RBC: 2.28 MIL/uL — ABNORMAL LOW (ref 3.87–5.11)
RDW: 15.7 % — ABNORMAL HIGH (ref 11.5–15.5)
WBC: 21.4 10*3/uL — ABNORMAL HIGH (ref 4.0–10.5)
nRBC: 0.1 % (ref 0.0–0.2)

## 2023-10-11 LAB — CBG MONITORING, ED
Glucose-Capillary: 156 mg/dL — ABNORMAL HIGH (ref 70–99)
Glucose-Capillary: 167 mg/dL — ABNORMAL HIGH (ref 70–99)
Glucose-Capillary: 45 mg/dL — ABNORMAL LOW (ref 70–99)
Glucose-Capillary: 80 mg/dL (ref 70–99)

## 2023-10-11 LAB — BASIC METABOLIC PANEL
Anion gap: 13 (ref 5–15)
BUN: 55 mg/dL — ABNORMAL HIGH (ref 6–20)
CO2: 21 mmol/L — ABNORMAL LOW (ref 22–32)
Calcium: 7.6 mg/dL — ABNORMAL LOW (ref 8.9–10.3)
Chloride: 99 mmol/L (ref 98–111)
Creatinine, Ser: 7.7 mg/dL — ABNORMAL HIGH (ref 0.44–1.00)
GFR, Estimated: 7 mL/min — ABNORMAL LOW (ref >60)
Glucose, Bld: 125 mg/dL — ABNORMAL HIGH (ref 70–99)
Potassium: 3.6 mmol/L (ref 3.5–5.1)
Sodium: 133 mmol/L — ABNORMAL LOW (ref 135–145)

## 2023-10-11 LAB — HCG, QUANTITATIVE, PREGNANCY: hCG, Beta Chain, Quant, S: 2 m[IU]/mL (ref <5)

## 2023-10-11 LAB — GLUCOSE, CAPILLARY
Glucose-Capillary: 184 mg/dL — ABNORMAL HIGH (ref 70–99)
Glucose-Capillary: 219 mg/dL — ABNORMAL HIGH (ref 70–99)

## 2023-10-11 LAB — MRSA NEXT GEN BY PCR, NASAL: MRSA by PCR Next Gen: NOT DETECTED

## 2023-10-11 MED ORDER — HYDROCODONE-ACETAMINOPHEN 5-325 MG PO TABS
1.0000 | ORAL_TABLET | Freq: Four times a day (QID) | ORAL | Status: DC | PRN
  Administered 2023-10-11 – 2023-10-12 (×3): 1 via ORAL
  Filled 2023-10-11 (×3): qty 1

## 2023-10-11 MED ORDER — VANCOMYCIN HCL 750 MG/150ML IV SOLN
750.0000 mg | INTRAVENOUS | Status: DC
  Administered 2023-10-11: 750 mg via INTRAVENOUS
  Filled 2023-10-11 (×2): qty 150

## 2023-10-11 MED ORDER — DEXTROSE 50 % IV SOLN: 1.0000 | Freq: Two times a day (BID) | INTRAVENOUS | Status: DC | PRN

## 2023-10-11 MED ORDER — CALCITRIOL 0.25 MCG PO CAPS
0.2500 ug | ORAL_CAPSULE | Freq: Every day | ORAL | Status: DC
  Administered 2023-10-11 – 2023-10-12 (×2): 0.25 ug via ORAL
  Filled 2023-10-11 (×2): qty 1

## 2023-10-11 MED ORDER — CARVEDILOL 6.25 MG PO TABS: 6.2500 mg | ORAL_TABLET | Freq: Two times a day (BID) | ORAL | Status: DC

## 2023-10-11 MED ORDER — DEXTROSE 50 % IV SOLN
1.0000 | Freq: Once | INTRAVENOUS | Status: AC
Start: 2023-10-11 — End: 2023-10-11
  Administered 2023-10-11: 50 mL via INTRAVENOUS
  Filled 2023-10-11: qty 50

## 2023-10-11 MED ORDER — OXYCODONE HCL 5 MG PO TABS
5.0000 mg | ORAL_TABLET | Freq: Once | ORAL | Status: AC
  Administered 2023-10-11: 5 mg via ORAL

## 2023-10-11 MED ORDER — AMLODIPINE BESYLATE 10 MG PO TABS
10.0000 mg | ORAL_TABLET | Freq: Every day | ORAL | Status: DC
  Administered 2023-10-11 – 2023-10-12 (×2): 10 mg via ORAL
  Filled 2023-10-11: qty 1
  Filled 2023-10-11 (×2): qty 2

## 2023-10-11 MED ORDER — DIPHENHYDRAMINE HCL 50 MG/ML IJ SOLN
INTRAMUSCULAR | Status: AC
  Filled 2023-10-11: qty 1

## 2023-10-11 MED ORDER — CITALOPRAM HYDROBROMIDE 20 MG PO TABS
10.0000 mg | ORAL_TABLET | Freq: Every day | ORAL | Status: DC
  Administered 2023-10-11 – 2023-10-12 (×2): 10 mg via ORAL
  Filled 2023-10-11 (×2): qty 1

## 2023-10-11 MED ORDER — LIDOCAINE-PRILOCAINE 2.5-2.5 % EX CREA: 1.0000 | TOPICAL_CREAM | CUTANEOUS | Status: DC | PRN

## 2023-10-11 MED ORDER — CARVEDILOL 6.25 MG PO TABS
6.2500 mg | ORAL_TABLET | Freq: Two times a day (BID) | ORAL | Status: DC
  Administered 2023-10-11 – 2023-10-12 (×4): 6.25 mg via ORAL
  Filled 2023-10-11 (×4): qty 1

## 2023-10-11 MED ORDER — MONTELUKAST SODIUM 10 MG PO TABS
10.0000 mg | ORAL_TABLET | Freq: Every day | ORAL | Status: DC
  Administered 2023-10-11: 10 mg via ORAL
  Filled 2023-10-11: qty 1

## 2023-10-11 MED ORDER — SEVELAMER CARBONATE 800 MG PO TABS
1600.0000 mg | ORAL_TABLET | Freq: Three times a day (TID) | ORAL | Status: DC
  Administered 2023-10-11 – 2023-10-12 (×2): 1600 mg via ORAL
  Filled 2023-10-11 (×4): qty 2

## 2023-10-11 MED ORDER — HEPARIN SODIUM (PORCINE) 1000 UNIT/ML DIALYSIS
1000.0000 [IU] | INTRAMUSCULAR | Status: DC | PRN
  Filled 2023-10-11: qty 1

## 2023-10-11 MED ORDER — DIPHENHYDRAMINE HCL 50 MG/ML IJ SOLN: 12.5000 mg | Freq: Once | INTRAMUSCULAR | Status: DC

## 2023-10-11 MED ORDER — OXYCODONE HCL 5 MG PO TABS
ORAL_TABLET | ORAL | Status: AC
  Filled 2023-10-11: qty 1

## 2023-10-11 MED ORDER — SODIUM CHLORIDE 0.9 % IV SOLN
1.0000 g | INTRAVENOUS | Status: DC
  Administered 2023-10-11: 1 g via INTRAVENOUS
  Filled 2023-10-11 (×2): qty 10

## 2023-10-11 MED ORDER — NORETHINDRONE ACET-ETHINYL EST 1-20 MG-MCG PO TABS: 1.0000 | ORAL_TABLET | Freq: Every day | ORAL | Status: DC

## 2023-10-11 MED ORDER — DIPHENHYDRAMINE HCL 50 MG/ML IJ SOLN
25.0000 mg | Freq: Once | INTRAMUSCULAR | Status: AC
  Administered 2023-10-11: 25 mg via INTRAVENOUS
  Filled 2023-10-11: qty 1

## 2023-10-11 MED ORDER — DIPHENHYDRAMINE HCL 50 MG/ML IJ SOLN
25.0000 mg | Freq: Once | INTRAMUSCULAR | Status: AC
  Administered 2023-10-11: 25 mg via INTRAVENOUS

## 2023-10-11 MED ORDER — VANCOMYCIN HCL 750 MG/150ML IV SOLN
750.0000 mg | INTRAVENOUS | Status: DC
  Filled 2023-10-11: qty 150

## 2023-10-11 MED ORDER — PENTAFLUOROPROP-TETRAFLUOROETH EX AERO
1.0000 | INHALATION_SPRAY | CUTANEOUS | Status: DC | PRN
  Filled 2023-10-11: qty 30

## 2023-10-11 MED ORDER — PANTOPRAZOLE SODIUM 40 MG PO TBEC
40.0000 mg | DELAYED_RELEASE_TABLET | Freq: Every day | ORAL | Status: DC
  Administered 2023-10-11 – 2023-10-12 (×2): 40 mg via ORAL
  Filled 2023-10-11 (×3): qty 1

## 2023-10-11 MED ORDER — CHLORHEXIDINE GLUCONATE CLOTH 2 % EX PADS
6.0000 | MEDICATED_PAD | Freq: Every day | CUTANEOUS | Status: DC
  Filled 2023-10-11: qty 6

## 2023-10-11 NOTE — Progress Notes (Signed)
Patient just came back from dialysis. itching all over. she got a once of benadryl in HD but she said she didn't feel she got the full affect because it was dialyzed out" MD Lyn Hollingshead notified and ordered a once repeat on the beneadryl

## 2023-10-11 NOTE — Progress Notes (Signed)
Pharmacy Antibiotic Note  Emma Thomas is a 27 y.o. female admitted on 10/10/2023 with pneumonia.  Pharmacy has been consulted for Vanc, Cefepime dosing.  Pt on HD Q MWF.   Plan: Cefepime 2 gm IV X 1 given in ED on 11/24 @ 2131. Cefepime 1 gm IV Q24H ordered to start on 11/25 @ 2100.  Vancomycin 1500 mg IV X 1 ordered for 11/25 @ ~ 0000. Vancomycin 750 mg IV Q MWF-HD ordered tos tart on 11/25 @ ~ 1200.    Height: 5\' 6"  (167.6 cm) Weight: 63.5 kg (140 lb) IBW/kg (Calculated) : 59.3  Temp (24hrs), Avg:99.9 F (37.7 C), Min:99.4 F (37.4 C), Max:100.4 F (38 C)  Recent Labs  Lab 10/10/23 2057 10/10/23 2223  WBC 29.4*  --   CREATININE 7.76*  --   LATICACIDVEN 2.2* 1.5    Estimated Creatinine Clearance: 10 mL/min (A) (by C-G formula based on SCr of 7.76 mg/dL (H)).    Allergies  Allergen Reactions   Losartan Swelling    Angioedema of tongue    Antimicrobials this admission:   >>    >>   Dose adjustments this admission:   Microbiology results:  BCx:   UCx:    Sputum:    MRSA PCR:  Thank you for allowing pharmacy to be a part of this patient's care.  Leticia Mcdiarmid D 10/11/2023 12:06 AM

## 2023-10-11 NOTE — ED Notes (Signed)
This RN was notified of pt's low BG of 45. Pt alert and oriented and denying all symptoms. Immediately gave patient orange juice and attempted to give patient peanut butter and graham crackers. Pt refused. This RN asked patient what she would like to eat. Pt refused to eat anything. MD was notified of patient's BG. MD was rounding in the area and verbalized order for an amp of D50.

## 2023-10-11 NOTE — Progress Notes (Signed)
Received patient in bed to unit.    Informed consent signed and in chart.    TX duration: 3.5 hrs     Transported back to floor  Hand-off given to patient's nurse.   Access used:  rt arm avf Access issues: n/a  Total UF removed: 1000 mls Medication(s) given: Benadryl 25 mg IV, Vancomycin 750 mg, Oxycodone 5mg  po      Maple Hudson, RN Dialysis Unit

## 2023-10-11 NOTE — Hospital Course (Signed)
HPI: Emma Thomas is a 27 y.o. female with medical history significant for ESRD on home hemodialysis, anemia of CKD, HTN, type 1 diabetes, HLD, prior history of osteomyelitis, hospitalized a month ago, 10/6 - 08/26/2023 with severe sepsis secondary to CAP, who presents to the ED with cough, shortness of breath and fevers.   Hospital course / significant events:  11/24: admitted to hospitalist service for sepsis d/t pneumonia HCAP. Started on vancomycin, cefepime. Requiring 4L O2 [place] 11/25: on room air this morning. BCx in process. 11/26: feeling better today, some cough, ambulating well without SOB, occasional nausea but tolerating diet. BCx NGx2d.     Consultants:  Nephrology  Procedures/Surgeries: none      ASSESSMENT & PLAN:   HCAP (healthcare-associated pneumonia) Severe Sepsis w/ acute hypoxic respiratory failure  Vancomycin and cefepime --> levaquin on dc to cover pseudomonas  Symptomatic treatment: antitussives, albuterol as needed   Uncontrolled type 1 diabetes mellitus with hyperglycemia, with long-term current use of insulin (HCC) Diabetic neuropathy Resume home regimen    Primary hypertension Continue carvedilol and amlodipine Resume bumex on discharge    ESRD on home hemodialysis [facility]) Patient does home hemodialysis Nephrology follow up for continuation of dialysis   Anemia due to chronic kidney disease Hemoglobin at baseline  monitor CBC Avoiding transfusion if possible as sh eis renal transplant candidate   Chronic diastolic CHF (congestive heart failure)  Continue beta blocker.   Restarting bumex on discharge  Daily weights

## 2023-10-11 NOTE — Plan of Care (Signed)
  Problem: Education: Goal: Ability to describe self-care measures that may prevent or decrease complications (Diabetes Survival Skills Education) will improve Outcome: Progressing Goal: Individualized Educational Video(s) Outcome: Progressing   Problem: Coping: Goal: Ability to adjust to condition or change in health will improve Outcome: Progressing   

## 2023-10-11 NOTE — Progress Notes (Signed)
PROGRESS NOTE    Emma Thomas   EGB:[id number] DOB: [birth year]  DOA: 10/10/2023 Date of Service: 10/11/23 which is hospital day 1  PCP: [name]    HPI: Emma Thomas is a 27 y.o. female with medical history significant for ESRD on home hemodialysis, anemia of CKD, HTN, type 1 diabetes, HLD, prior history of osteomyelitis, hospitalized a month ago, 10/6 - 08/26/2023 with severe sepsis secondary to CAP, who presents to the ED with cough, shortness of breath and fevers.   Hospital course / significant events:  11/24: admitted to hospitalist service for sepsis d/t pneumonia HCAP. Started on vancomycin, cefepime. Requiring 4L O2 [place] 11/25: on room air this morning. BCx in process.    Consultants:  Nephrology  Procedures/Surgeries: none      ASSESSMENT & PLAN:   HCAP (healthcare-associated pneumonia) Severe Sepsis w/ acute hypoxic respiratory failure  Vancomycin and cefepime Symptomatic treatment: antitussives, albuterol as needed Incentive spirometer Continue supplemental oxygen as needed  Follow blood cultures-at risk for bacteremia as patient is on home hemodialysis   Uncontrolled type 1 diabetes mellitus with hyperglycemia, with long-term current use of insulin (HCC) Diabetic neuropathy Basal insulin with sliding scale coverage Gabapentin if needed for neuropathic pain   Primary hypertension Continue carvedilol and amlodipine   ESRD on home hemodialysis [facility]) Patient does home hemodialysis Nephrology consulted for continuation of dialysis   Anemia due to chronic kidney disease Hemoglobin at baseline  monitor CBC  Chronic diastolic CHF (congestive heart failure)  Continue beta blocker.   hold Bumex for now in setting of sepsis, likely restart tomorrow / sooner if fluid overload  Daily weights      Normal weight based on BMI: Body mass index is 22.6 kg/m.  Underweight - under 18.5  normal weight - 18.5 to 24.9 overweight - 25 to  29.9 obese - 30 or more   DVT prophylaxis: heparin IV fluids: stopped continuous IV fluids this morning Nutrition: cardiac/carb diet  Central lines / invasive devices: none new  Code Status: FULL CODE ACP documentation reviewed: none on file in [facility]  TOC needs: none at this time Barriers to dispo / significant pending items: pend blood cultures             Subjective / Brief ROS:  Patient reports tired, no appetite, Glc low but declining juice/food d/t no appetite, denies abd pain or nausea  Denies CP/SOB.  Denies focal weakness.    Family Communication: none at this time     Objective Findings:  Vitals:   10/11/23 0600 10/11/23 0630 10/11/23 0700 10/11/23 0715  BP: (!) 128/95 (!) 130/101 (!) 127/95   Pulse: 81 75 77 76  Resp: (!) 21 18 17  (!) 27  Temp:      TempSrc:      SpO2: 94% 99% 93% 90%  Weight:      Height:       No intake or output data in the 24 hours ending 10/11/23 0730 Filed Weights   10/10/23 2028  Weight: 63.5 kg    Examination:  Physical Exam Constitutional:      General: She is not in acute distress. Cardiovascular:     Rate and Rhythm: Normal rate and regular rhythm.  Pulmonary:     Effort: Pulmonary effort is normal.     Breath sounds: Examination of the right-lower field reveals rales. Examination of the left-lower field reveals rales. Rales present.  Musculoskeletal:     Right lower leg: No edema.  Left lower leg: No edema.  Skin:    General: Skin is warm and dry.  Neurological:     General: No focal deficit present.     Mental Status: She is alert and oriented to person, place, and time.  Psychiatric:        Mood and Affect: Mood normal.        Behavior: Behavior normal.          Scheduled Medications:   amLODipine  10 mg Oral Daily   atorvastatin  80 mg Oral Daily   calcitRIOL  0.25 mcg Oral Daily   carvedilol  6.25 mg Oral BID WC   citalopram  10 mg Oral Daily   heparin  5,000 Units Subcutaneous Q8H    insulin aspart  0-5 Units Subcutaneous QHS   insulin aspart  0-6 Units Subcutaneous TID WC   insulin glargine-yfgn  6 Units Subcutaneous Daily   montelukast  10 mg Oral QHS   norethindrone-ethinyl estradiol  1 tablet Oral Daily   pantoprazole  40 mg Oral Daily   sevelamer carbonate  800 mg Oral TID WC    Continuous Infusions:  ceFEPime (MAXIPIME) IV     vancomycin      PRN Medications:  acetaminophen **OR** acetaminophen, gabapentin, guaiFENesin-codeine, HYDROcodone-acetaminophen, ondansetron **OR** ondansetron (ZOFRAN) IV  Antimicrobials from admission:  Anti-infectives (From admission, onward)    Start     Dose/Rate Route Frequency Ordered Stop   10/11/23 2100  ceFEPIme (MAXIPIME) 1 g in sodium chloride 0.9 % 100 mL IVPB        1 g 200 mL/hr over 30 Minutes Intravenous Every 24 hours 10/11/23 0004     10/11/23 1200  vancomycin (VANCOREADY) IVPB 750 mg/150 mL        750 mg 150 mL/hr over 60 Minutes Intravenous Every M-W-F (Hemodialysis) 10/11/23 0005     10/11/23 0000  vancomycin (VANCOREADY) IVPB 1500 mg/300 mL        1,500 mg 150 mL/hr over 120 Minutes Intravenous  Once 10/10/23 2345 10/11/23 0244   10/10/23 2130  ceFEPIme (MAXIPIME) 2 g in sodium chloride 0.9 % 100 mL IVPB        2 g 200 mL/hr over 30 Minutes Intravenous  Once 10/10/23 2115 10/10/23 2209   10/10/23 2130  metroNIDAZOLE (FLAGYL) IVPB 500 mg        500 mg 100 mL/hr over 60 Minutes Intravenous  Once 10/10/23 2115 10/10/23 2335   10/10/23 2130  vancomycin (VANCOCIN) IVPB 1000 mg/200 mL premix  Status:  Discontinued        1,000 mg 200 mL/hr over 60 Minutes Intravenous  Once 10/10/23 2115 10/10/23 2119   10/10/23 2130  vancomycin (VANCOREADY) IVPB 1250 mg/250 mL  Status:  Discontinued        1,250 mg 166.7 mL/hr over 90 Minutes Intravenous  Once 10/10/23 2119 10/10/23 2344           Data Reviewed:  I have personally reviewed the following...  CBC: Recent Labs  Lab 10/10/23 2057 10/11/23 0505   WBC 29.4* 21.4*  NEUTROABS 27.3*  --   HGB 9.1* 6.9*  HCT 28.1* 21.2*  MCV 91.8 93.0  PLT 305 271   Basic Metabolic Panel: Recent Labs  Lab 10/10/23 2057 10/11/23 0505  NA 133* 133*  K 3.7 3.6  CL 95* 99  CO2 18* 21*  GLUCOSE 222* 125*  BUN 54* 55*  CREATININE 7.76* 7.70*  CALCIUM 8.4* 7.6*   GFR: Estimated Creatinine Clearance: 10.1  mL/min (A) (by C-G formula based on SCr of 7.7 mg/dL (H)). Liver Function Tests: Recent Labs  Lab 10/10/23 2057  AST 27  ALT 18  ALKPHOS 105  BILITOT 1.6*  PROT 8.7*  ALBUMIN 3.3*   No results for input(s): "LIPASE", "AMYLASE" in the last 168 hours. No results for input(s): "AMMONIA" in the last 168 hours. Coagulation Profile: Recent Labs  Lab 10/10/23 2057  INR 1.2   Cardiac Enzymes: No results for input(s): "CKTOTAL", "CKMB", "CKMBINDEX", "TROPONINI" in the last 168 hours. BNP (last 3 results) No results for input(s): "PROBNP" in the last 8760 hours. HbA1C: No results for input(s): "HGBA1C" in the last 72 hours. CBG: Recent Labs  Lab 10/11/23 0036  GLUCAP 156*   Lipid Profile: No results for input(s): "CHOL", "HDL", "LDLCALC", "TRIG", "CHOLHDL", "LDLDIRECT" in the last 72 hours. Thyroid Function Tests: No results for input(s): "TSH", "T4TOTAL", "FREET4", "T3FREE", "THYROIDAB" in the last 72 hours. Anemia Panel: No results for input(s): "VITAMINB12", "FOLATE", "FERRITIN", "TIBC", "IRON", "RETICCTPCT" in the last 72 hours. Most Recent Urinalysis On File:     Component Value Date/Time   COLORURINE YELLOW (A) 10/10/2023 2057   APPEARANCEUR HAZY (A) 10/10/2023 2057   LABSPEC 1.016 10/10/2023 2057   PHURINE 6.0 10/10/2023 2057   GLUCOSEU 150 (A) 10/10/2023 2057   HGBUR MODERATE (A) 10/10/2023 2057   BILIRUBINUR NEGATIVE 10/10/2023 2057   KETONESUR 5 (A) 10/10/2023 2057   PROTEINUR >=300 (A) 10/10/2023 2057   NITRITE NEGATIVE 10/10/2023 2057   LEUKOCYTESUR NEGATIVE 10/10/2023 2057   Sepsis  Labs: @LABRCNTIP (procalcitonin:4,lacticidven:4) Microbiology: Recent Results (from the past 240 hour(s))  Resp panel by RT-PCR (RSV, Flu A&B, Covid) Anterior Nasal Swab     Status: None   Collection Time: 10/10/23  9:33 PM   Specimen: Anterior Nasal Swab  Result Value Ref Range Status   SARS Coronavirus 2 by RT PCR NEGATIVE NEGATIVE Final    Comment: (NOTE) SARS-CoV-2 target nucleic acids are NOT DETECTED.  The SARS-CoV-2 RNA is generally detectable in upper respiratory specimens during the acute phase of infection. The lowest concentration of SARS-CoV-2 viral copies this assay can detect is 138 copies/mL. A negative result does not preclude SARS-Cov-2 infection and should not be used as the sole basis for treatment or other patient management decisions. A negative result may occur with  improper specimen collection/handling, submission of specimen other than nasopharyngeal swab, presence of viral mutation(s) within the areas targeted by this assay, and inadequate number of viral copies(<138 copies/mL). A negative result must be combined with clinical observations, patient history, and epidemiological information. The expected result is Negative.  Fact Sheet for Patients:  BloggerCourse.com  Fact Sheet for Healthcare Providers:  SeriousBroker.it  This test is no t yet approved or cleared by the [place] FDA and  has been authorized for detection and/or diagnosis of SARS-CoV-2 by FDA under an Emergency Use Authorization (EUA). This EUA will remain  in effect (meaning this test can be used) for the duration of the COVID-19 declaration under Section 564(b)(1) of the Act, 21 U.S.C.section 360bbb-3(b)(1), unless the authorization is terminated  or revoked sooner.       Influenza A by PCR NEGATIVE NEGATIVE Final   Influenza B by PCR NEGATIVE NEGATIVE Final    Comment: (NOTE) The Xpert Xpress SARS-CoV-2/FLU/RSV plus assay is  intended as an aid in the diagnosis of influenza from Nasopharyngeal swab specimens and should not be used as a sole basis for treatment. Nasal washings and aspirates are unacceptable for Xpert  Xpress SARS-CoV-2/FLU/RSV testing.  Fact Sheet for Patients: BloggerCourse.com  Fact Sheet for Healthcare Providers: SeriousBroker.it  This test is not yet approved or cleared by the [place] FDA and has been authorized for detection and/or diagnosis of SARS-CoV-2 by FDA under an Emergency Use Authorization (EUA). This EUA will remain in effect (meaning this test can be used) for the duration of the COVID-19 declaration under Section 564(b)(1) of the Act, 21 U.S.C. section 360bbb-3(b)(1), unless the authorization is terminated or revoked.     Resp Syncytial Virus by PCR NEGATIVE NEGATIVE Final    Comment: (NOTE) Fact Sheet for Patients: BloggerCourse.com  Fact Sheet for Healthcare Providers: SeriousBroker.it  This test is not yet approved or cleared by the [place] FDA and has been authorized for detection and/or diagnosis of SARS-CoV-2 by FDA under an Emergency Use Authorization (EUA). This EUA will remain in effect (meaning this test can be used) for the duration of the COVID-19 declaration under Section 564(b)(1) of the Act, 21 U.S.C. section 360bbb-3(b)(1), unless the authorization is terminated or revoked.  Performed at [facility], [address], [place], [place] [address]       Radiology Studies last 3 days: DG Chest 2 View  Result Date: 10/10/2023 CLINICAL DATA:  Shortness of breath. EXAM: CHEST - 2 VIEW COMPARISON:  Chest radiograph dated 09/23/2023. FINDINGS: The heart size and mediastinal contours are within normal limits. There are patchy bilateral airspace opacities, most significant in the right lower lung and left mid lung. Trace pleural effusions  may contribute. No pneumothorax. The visualized skeletal structures are unremarkable. IMPRESSION: Patchy bilateral airspace opacities may reflect multifocal pneumonia. Electronically Signed   By: Romona Curls M.D.   On: 10/10/2023 21:26        Sunnie Nielsen, DO [facility] 10/11/2023, 7:30 AM    Dictation software may have been used to generate the above note. Typos may occur and escape review in typed/dictated notes. Please contact Dr Lyn Hollingshead directly for clarity if needed.  Staff may message me via secure chat in Epic  but this may not receive an immediate response,  please page me for urgent matters!  If 7PM-7AM, please contact night coverage [email or web address]

## 2023-10-11 NOTE — Inpatient Diabetes Management (Signed)
Inpatient Diabetes Program Recommendations  AACE/ADA: New Consensus Statement on Inpatient Glycemic Control (2015)  Target Ranges:  Prepandial:   less than 140 mg/dL      Peak postprandial:   less than 180 mg/dL (1-2 hours)      Critically ill patients:  140 - 180 mg/dL    Latest Reference Range & Units 11/20/22 19:49 06/18/23 12:35  Hemoglobin A1C 4.8 - 5.6 % 7.9 (H) 8.5 (H)  (H): Data is abnormally high  Latest Reference Range & Units 10/11/23 00:36 10/11/23 07:59  Glucose-Capillary 70 - 99 mg/dL 782 (H) 80  (H): Data is abnormally high   Admit with:  HCAP (healthcare-associated pneumonia) Sepsis Acute respiratory failure with hypoxia  History: Type 1 Diabetes, ESRD  Home DM Meds: Tandem Insulin Pump  Current Orders: Semglee 6 units daily      Novolog 0-6 units TID AC + HS   ENDO: Glean Hess, PA with [facility] Last seen by Telemedicine Visit 04/06/2023 Insulin Pump Settings: Tandem Insulin Pump--14.8 units total basal per 24 hours Normal  12a 0.5, 65, 8 3a 0.5, 65, 8 6a 0.6, 50, 8 11a 0.6, 50, 8 4p 0.8,40, 7 9p 0.6, 55, 8  HIGH 12a 0.8, 40, 7 8a 0.9, 35, 6 4p 1.1, 30, 6 9p 0.9, 35, 7     --Will follow patient during hospitalization--  Ambrose Finland RN, MSN, CDCES Diabetes Coordinator Inpatient Glycemic Control Team Team Pager: [phone] (8a-5p)

## 2023-10-11 NOTE — ED Notes (Addendum)
Repeat BG obtained. MD verbalized that she would place an order for PRN dextrose since patient is refusing to eat

## 2023-10-11 NOTE — ED Notes (Signed)
Breakfast tray sat at bedside. Pt declining to eat at this time

## 2023-10-11 NOTE — Progress Notes (Signed)
Patient arrived from the ED via wheelchair.

## 2023-10-11 NOTE — Progress Notes (Signed)
[facility]  ROUNDING NOTE   Subjective:   Emma Thomas is a 27 y.o female with past medical history of diabetes, anemia, and diabetes type 1, and end stage renal disease on home hemodialysis dialysis. Patient presents to the ED with cough and fever. She has been admitted for HCAP (healthcare-associated pneumonia) [J18.9] Sepsis (HCC) [A41.9] Multifocal pneumonia [J18.9] Sepsis with acute hypoxic respiratory failure without septic shock, due to unspecified organism (HCC) [A41.9, R65.20, J96.01]   Patient is known to our practice and completes home hemodialysis supervised by Dr. Cherylann Ratel. Last treatment completed on Friday. Patient states she hasn't felt well for awhile. Appetite has decrease slightly. Has not missed any treatments.    Objective:  Vital signs in last 24 hours:  Temp:  [97.9 F (36.6 C)-100.4 F (38 C)] 97.9 F (36.6 C) (11/25 1333) Pulse Rate:  [74-107] 79 (11/25 1600) Resp:  [0-32] 21 (11/25 1600) BP: (104-163)/(67-106) 104/67 (11/25 1600) SpO2:  [87 %-100 %] 92 % (11/25 1600) Weight:  [63.5 kg-71.9 kg] 71.9 kg (11/25 1349)  Weight change:  Filed Weights   10/10/23 2028 10/11/23 1349  Weight: 63.5 kg 71.9 kg    Intake/Output: No intake/output data recorded.   Intake/Output this shift:  Total I/O In: 15 [IV Piggyback:15] Out: -   Physical Exam: General: NAD,   Head: Normocephalic, atraumatic. Moist oral mucosal membranes  Eyes: Anicteric  Lungs:  Clear to auscultation  Heart: Regular rate and rhythm  Abdomen:  Soft, nontender  Extremities:  No peripheral edema.  Neurologic: Alert and oriented, moving all four extremities  Skin: No lesions  Access: Rt AVF     Basic Metabolic Panel: Recent Labs  Lab 10/10/23 2057 10/11/23 0505  NA 133* 133*  K 3.7 3.6  CL 95* 99  CO2 18* 21*  GLUCOSE 222* 125*  BUN 54* 55*  CREATININE 7.76* 7.70*  CALCIUM 8.4* 7.6*    Liver Function Tests: Recent Labs  Lab 10/10/23 2057  AST 27   ALT 18  ALKPHOS 105  BILITOT 1.6*  PROT 8.7*  ALBUMIN 3.3*   No results for input(s): "LIPASE", "AMYLASE" in the last 168 hours. No results for input(s): "AMMONIA" in the last 168 hours.  CBC: Recent Labs  Lab 10/10/23 2057 10/11/23 0505  WBC 29.4* 21.4*  NEUTROABS 27.3*  --   HGB 9.1* 6.9*  HCT 28.1* 21.2*  MCV 91.8 93.0  PLT 305 271    Cardiac Enzymes: No results for input(s): "CKTOTAL", "CKMB", "CKMBINDEX", "TROPONINI" in the last 168 hours.  BNP: Invalid input(s): "POCBNP"  CBG: Recent Labs  Lab 10/11/23 0036 10/11/23 0759 10/11/23 1137 10/11/23 1156  GLUCAP 156* 80 45* 167*    Microbiology: Results for orders placed or performed during the hospital encounter of 10/10/23  Culture, blood (Routine x 2)     Status: None (Preliminary result)   Collection Time: 10/10/23  8:57 PM   Specimen: Left Antecubital; Blood  Result Value Ref Range Status   Specimen Description LEFT ANTECUBITAL  Final   Special Requests   Final    BOTTLES DRAWN AEROBIC AND ANAEROBIC Blood Culture adequate volume   Culture   Final    NO GROWTH < 12 HOURS Performed at [facility], [address] [place], [place] [address]    Report Status PENDING  Incomplete  Culture, blood (Routine x 2)     Status: None (Preliminary result)   Collection Time: 10/10/23  8:57 PM   Specimen: BLOOD LEFT FOREARM  Result  Value Ref Range Status   Specimen Description BLOOD LEFT FOREARM  Final   Special Requests   Final    BOTTLES DRAWN AEROBIC AND ANAEROBIC Blood Culture results may not be optimal due to an inadequate volume of blood received in culture bottles   Culture   Final    NO GROWTH < 12 HOURS Performed at [facility], [address], [place], [place] [address]    Report Status PENDING  Incomplete  Resp panel by RT-PCR (RSV, Flu A&B, Covid) Anterior Nasal Swab     Status: None   Collection Time: 10/10/23  9:33 PM   Specimen: Anterior Nasal Swab  Result Value Ref Range  Status   SARS Coronavirus 2 by RT PCR NEGATIVE NEGATIVE Final    Comment: (NOTE) SARS-CoV-2 target nucleic acids are NOT DETECTED.  The SARS-CoV-2 RNA is generally detectable in upper respiratory specimens during the acute phase of infection. The lowest concentration of SARS-CoV-2 viral copies this assay can detect is 138 copies/mL. A negative result does not preclude SARS-Cov-2 infection and should not be used as the sole basis for treatment or other patient management decisions. A negative result may occur with  improper specimen collection/handling, submission of specimen other than nasopharyngeal swab, presence of viral mutation(s) within the areas targeted by this assay, and inadequate number of viral copies(<138 copies/mL). A negative result must be combined with clinical observations, patient history, and epidemiological information. The expected result is Negative.  Fact Sheet for Patients:  BloggerCourse.com  Fact Sheet for Healthcare Providers:  SeriousBroker.it  This test is no t yet approved or cleared by the [place] FDA and  has been authorized for detection and/or diagnosis of SARS-CoV-2 by FDA under an Emergency Use Authorization (EUA). This EUA will remain  in effect (meaning this test can be used) for the duration of the COVID-19 declaration under Section 564(b)(1) of the Act, 21 U.S.C.section 360bbb-3(b)(1), unless the authorization is terminated  or revoked sooner.       Influenza A by PCR NEGATIVE NEGATIVE Final   Influenza B by PCR NEGATIVE NEGATIVE Final    Comment: (NOTE) The Xpert Xpress SARS-CoV-2/FLU/RSV plus assay is intended as an aid in the diagnosis of influenza from Nasopharyngeal swab specimens and should not be used as a sole basis for treatment. Nasal washings and aspirates are unacceptable for Xpert Xpress SARS-CoV-2/FLU/RSV testing.  Fact Sheet for  Patients: BloggerCourse.com  Fact Sheet for Healthcare Providers: SeriousBroker.it  This test is not yet approved or cleared by the [place] FDA and has been authorized for detection and/or diagnosis of SARS-CoV-2 by FDA under an Emergency Use Authorization (EUA). This EUA will remain in effect (meaning this test can be used) for the duration of the COVID-19 declaration under Section 564(b)(1) of the Act, 21 U.S.C. section 360bbb-3(b)(1), unless the authorization is terminated or revoked.     Resp Syncytial Virus by PCR NEGATIVE NEGATIVE Final    Comment: (NOTE) Fact Sheet for Patients: BloggerCourse.com  Fact Sheet for Healthcare Providers: SeriousBroker.it  This test is not yet approved or cleared by the [place] FDA and has been authorized for detection and/or diagnosis of SARS-CoV-2 by FDA under an Emergency Use Authorization (EUA). This EUA will remain in effect (meaning this test can be used) for the duration of the COVID-19 declaration under Section 564(b)(1) of the Act, 21 U.S.C. section 360bbb-3(b)(1), unless the authorization is terminated or revoked.  Performed at [facility], [address], [place], [place] [address]  MRSA Next Gen by PCR, Nasal     Status: None   Collection Time: 10/11/23  1:14 PM   Specimen: Nasal Mucosa; Nasal Swab  Result Value Ref Range Status   MRSA by PCR Next Gen NOT DETECTED NOT DETECTED Final    Comment: (NOTE) The GeneXpert MRSA Assay (FDA approved for NASAL specimens only), is one component of a comprehensive MRSA colonization surveillance program. It is not intended to diagnose MRSA infection nor to guide or monitor treatment for MRSA infections. Test performance is not FDA approved in patients less than 63 years old. Performed at [facility], [address] [place], [place] [address]      Coagulation Studies: Recent Labs    10/10/23 10-24-2056  LABPROT 15.5*  INR 1.2    Urinalysis: Recent Labs    10/10/23 2057  COLORURINE YELLOW*  LABSPEC 1.016  PHURINE 6.0  GLUCOSEU 150*  HGBUR MODERATE*  BILIRUBINUR NEGATIVE  KETONESUR 5*  PROTEINUR >=300*  NITRITE NEGATIVE  LEUKOCYTESUR NEGATIVE      Imaging: DG Chest 2 View  Result Date: 10/10/2023 CLINICAL DATA:  Shortness of breath. EXAM: CHEST - 2 VIEW COMPARISON:  Chest radiograph dated 09/23/2023. FINDINGS: The heart size and mediastinal contours are within normal limits. There are patchy bilateral airspace opacities, most significant in the right lower lung and left mid lung. Trace pleural effusions may contribute. No pneumothorax. The visualized skeletal structures are unremarkable. IMPRESSION: Patchy bilateral airspace opacities may reflect multifocal pneumonia. Electronically Signed   By: Romona Curls M.D.   On: 10/10/2023 21:26     Medications:    ceFEPime (MAXIPIME) IV     vancomycin 150 mL/hr at 10/11/23 1635    amLODipine  10 mg Oral Daily   atorvastatin  80 mg Oral Daily   calcitRIOL  0.25 mcg Oral Daily   carvedilol  6.25 mg Oral BID WC   Chlorhexidine Gluconate Cloth  6 each Topical Q0600   citalopram  10 mg Oral Daily   diphenhydrAMINE  25 mg Intravenous Once   heparin  5,000 Units Subcutaneous Q8H   insulin aspart  0-5 Units Subcutaneous QHS   insulin aspart  0-6 Units Subcutaneous TID WC   insulin glargine-yfgn  6 Units Subcutaneous Daily   montelukast  10 mg Oral QHS   pantoprazole  40 mg Oral Daily   sevelamer carbonate  1,600 mg Oral TID WC   acetaminophen **OR** acetaminophen, dextrose, gabapentin, guaiFENesin-codeine, heparin, HYDROcodone-acetaminophen, lidocaine-prilocaine, ondansetron **OR** ondansetron (ZOFRAN) IV, pentafluoroprop-tetrafluoroeth  Assessment/ Plan:  Ms. TAUSHA SHIFF is a 27 y.o.  female with past medical history of diabetes, anemia, and diabetes type 1, and end  stage renal disease on home hemodialysis dialysis. Patient presents to the ED with cough and fever.   End stage renal disease on home hemodialysis. Last treatment completed on 10/08/23. Will provide dialysis treatment today. Next treatment scheduled for Wednesday.   2. Anemia of chronic kidney disease Lab Results  Component Value Date   HGB 6.9 (L) 10/11/2023    Hgb decreased, 6.9. Will defer blood transfusion to primary team. Will order EPO with dialysis.   3. Secondary Hyperparathyroidism:   Lab Results  Component Value Date   CALCIUM 7.6 (L) 10/11/2023   CAION 0.97 (L) 01/08/2023   PHOS 6.2 (H) 08/26/2023    Calcium slightly decreased with elevated phosphorus. Prescribed calcitriol and sevelamer outpatient.   4. Hypertension with chronic kidney disease. Home regimen consist of amlodipine, bumetamide, carvedilol and metoprolol.  Only receiving amlodipine  and carvedilol.    LOS: 1 Raeshawn Vo 11/25/20244:37 PM

## 2023-10-12 DIAGNOSIS — J189 Pneumonia, unspecified organism: Secondary | ICD-10-CM | POA: Diagnosis not present

## 2023-10-12 LAB — HEPATITIS B SURFACE ANTIGEN: Hepatitis B Surface Ag: NONREACTIVE

## 2023-10-12 LAB — HEPATITIS B SURFACE ANTIBODY, QUANTITATIVE: Hep B S AB Quant (Post): 259 m[IU]/mL

## 2023-10-12 LAB — GLUCOSE, CAPILLARY
Glucose-Capillary: 374 mg/dL — ABNORMAL HIGH (ref 70–99)
Glucose-Capillary: 299 mg/dL — ABNORMAL HIGH (ref 70–99)

## 2023-10-12 LAB — HEMOGLOBIN AND HEMATOCRIT, BLOOD
HCT: 22.8 % — ABNORMAL LOW (ref 36.0–46.0)
Hemoglobin: 7.4 g/dL — ABNORMAL LOW (ref 12.0–15.0)

## 2023-10-12 MED ORDER — ALBUTEROL SULFATE HFA 108 (90 BASE) MCG/ACT IN AERS: 2.0000 | INHALATION_SPRAY | Freq: Four times a day (QID) | RESPIRATORY_TRACT | 20 refills | Status: DC | PRN

## 2023-10-12 MED ORDER — LEVOFLOXACIN 250 MG PO TABS: 250.0000 mg | ORAL_TABLET | Freq: Every day | ORAL | 0 refills | Status: AC

## 2023-10-12 MED ORDER — GUAIFENESIN-CODEINE 100-10 MG/5ML PO SOLN: 5.0000 mL | Freq: Two times a day (BID) | ORAL | 0 refills | Status: DC | PRN

## 2023-10-12 MED ORDER — PROMETHAZINE HCL 12.5 MG PO TABS: 12.5000 mg | ORAL_TABLET | Freq: Three times a day (TID) | ORAL | 0 refills | Status: AC | PRN

## 2023-10-12 MED ORDER — LEVOFLOXACIN 750 MG PO TABS
750.0000 mg | ORAL_TABLET | Freq: Once | ORAL | Status: AC
  Administered 2023-10-12: 750 mg via ORAL
  Filled 2023-10-12: qty 1

## 2023-10-12 NOTE — Progress Notes (Signed)
[facility]  ROUNDING NOTE   Subjective:   Emma Thomas is a 27 y.o female with past medical history of diabetes, anemia, and diabetes type 1, and end stage renal disease on home hemodialysis dialysis. Patient presents to the ED with cough and fever. She has been admitted for HCAP (healthcare-associated pneumonia) [J18.9] Sepsis (HCC) [A41.9] Multifocal pneumonia [J18.9] Sepsis with acute hypoxic respiratory failure without septic shock, due to unspecified organism (HCC) [A41.9, R65.20, J96.01]   Patient is known to our practice and completes home hemodialysis supervised by Dr. Cherylann Ratel.   Patient seen laying in bed States she still feels bad No concerns with dialysis yesterday Room air   Objective:  Vital signs in last 24 hours:  Temp:  [97.9 F (36.6 C)-99.3 F (37.4 C)] 99.3 F (37.4 C) (11/26 0818) Pulse Rate:  [79-91] 91 (11/26 0818) Resp:  [15-25] 16 (11/26 0818) BP: (104-154)/(67-100) 154/100 (11/26 0818) SpO2:  [92 %-100 %] 98 % (11/26 0818)  Weight change: 8.396 kg Filed Weights   10/10/23 2028 10/11/23 1349  Weight: 63.5 kg 71.9 kg    Intake/Output: I/O last 3 completed shifts: In: 15 [IV Piggyback:15] Out: 1000 [Other:1000]   Intake/Output this shift:  No intake/output data recorded.  Physical Exam: General: NAD,   Head: Normocephalic, atraumatic. Moist oral mucosal membranes  Eyes: Anicteric  Lungs:  Clear to auscultation, normal effort  Heart: Regular rate and rhythm  Abdomen:  Soft, nontender  Extremities:  No peripheral edema.  Neurologic: Alert and oriented, moving all four extremities  Skin: No lesions  Access: Rt AVF     Basic Metabolic Panel: Recent Labs  Lab 10/10/23 2057 10/11/23 0505  NA 133* 133*  K 3.7 3.6  CL 95* 99  CO2 18* 21*  GLUCOSE 222* 125*  BUN 54* 55*  CREATININE 7.76* 7.70*  CALCIUM 8.4* 7.6*    Liver Function Tests: Recent Labs  Lab 10/10/23 2057  AST 27  ALT 18  ALKPHOS 105  BILITOT  1.6*  PROT 8.7*  ALBUMIN 3.3*   No results for input(s): "LIPASE", "AMYLASE" in the last 168 hours. No results for input(s): "AMMONIA" in the last 168 hours.  CBC: Recent Labs  Lab 10/10/23 2057 10/11/23 0505 10/12/23 0758  WBC 29.4* 21.4*  --   NEUTROABS 27.3*  --   --   HGB 9.1* 6.9* 7.4*  HCT 28.1* 21.2* 22.8*  MCV 91.8 93.0  --   PLT 305 271  --     Cardiac Enzymes: No results for input(s): "CKTOTAL", "CKMB", "CKMBINDEX", "TROPONINI" in the last 168 hours.  BNP: Invalid input(s): "POCBNP"  CBG: Recent Labs  Lab 10/11/23 1156 10/11/23 1832 10/11/23 2110 10/12/23 0818 10/12/23 1110  GLUCAP 167* 184* 219* 374* 299*    Microbiology: Results for orders placed or performed during the hospital encounter of 10/10/23  Culture, blood (Routine x 2)     Status: None (Preliminary result)   Collection Time: 10/10/23  8:57 PM   Specimen: Left Antecubital; Blood  Result Value Ref Range Status   Specimen Description LEFT ANTECUBITAL  Final   Special Requests   Final    BOTTLES DRAWN AEROBIC AND ANAEROBIC Blood Culture adequate volume   Culture   Final    NO GROWTH 2 DAYS Performed at [facility], [address] [place], [place] [address]    Report Status PENDING  Incomplete  Culture, blood (Routine x 2)     Status: None (Preliminary result)   Collection Time: 10/10/23  8:57 PM   Specimen: BLOOD LEFT FOREARM  Result Value Ref Range Status   Specimen Description BLOOD LEFT FOREARM  Final   Special Requests   Final    BOTTLES DRAWN AEROBIC AND ANAEROBIC Blood Culture results may not be optimal due to an inadequate volume of blood received in culture bottles   Culture   Final    NO GROWTH 2 DAYS Performed at [facility], [address], [place], [place] [address]    Report Status PENDING  Incomplete  Resp panel by RT-PCR (RSV, Flu A&B, Covid) Anterior Nasal Swab     Status: None   Collection Time: 10/10/23  9:33 PM   Specimen: Anterior Nasal  Swab  Result Value Ref Range Status   SARS Coronavirus 2 by RT PCR NEGATIVE NEGATIVE Final    Comment: (NOTE) SARS-CoV-2 target nucleic acids are NOT DETECTED.  The SARS-CoV-2 RNA is generally detectable in upper respiratory specimens during the acute phase of infection. The lowest concentration of SARS-CoV-2 viral copies this assay can detect is 138 copies/mL. A negative result does not preclude SARS-Cov-2 infection and should not be used as the sole basis for treatment or other patient management decisions. A negative result may occur with  improper specimen collection/handling, submission of specimen other than nasopharyngeal swab, presence of viral mutation(s) within the areas targeted by this assay, and inadequate number of viral copies(<138 copies/mL). A negative result must be combined with clinical observations, patient history, and epidemiological information. The expected result is Negative.  Fact Sheet for Patients:  BloggerCourse.com  Fact Sheet for Healthcare Providers:  SeriousBroker.it  This test is no t yet approved or cleared by the [place] FDA and  has been authorized for detection and/or diagnosis of SARS-CoV-2 by FDA under an Emergency Use Authorization (EUA). This EUA will remain  in effect (meaning this test can be used) for the duration of the COVID-19 declaration under Section 564(b)(1) of the Act, 21 U.S.C.section 360bbb-3(b)(1), unless the authorization is terminated  or revoked sooner.       Influenza A by PCR NEGATIVE NEGATIVE Final   Influenza B by PCR NEGATIVE NEGATIVE Final    Comment: (NOTE) The Xpert Xpress SARS-CoV-2/FLU/RSV plus assay is intended as an aid in the diagnosis of influenza from Nasopharyngeal swab specimens and should not be used as a sole basis for treatment. Nasal washings and aspirates are unacceptable for Xpert Xpress SARS-CoV-2/FLU/RSV testing.  Fact Sheet for  Patients: BloggerCourse.com  Fact Sheet for Healthcare Providers: SeriousBroker.it  This test is not yet approved or cleared by the [place] FDA and has been authorized for detection and/or diagnosis of SARS-CoV-2 by FDA under an Emergency Use Authorization (EUA). This EUA will remain in effect (meaning this test can be used) for the duration of the COVID-19 declaration under Section 564(b)(1) of the Act, 21 U.S.C. section 360bbb-3(b)(1), unless the authorization is terminated or revoked.     Resp Syncytial Virus by PCR NEGATIVE NEGATIVE Final    Comment: (NOTE) Fact Sheet for Patients: BloggerCourse.com  Fact Sheet for Healthcare Providers: SeriousBroker.it  This test is not yet approved or cleared by the [place] FDA and has been authorized for detection and/or diagnosis of SARS-CoV-2 by FDA under an Emergency Use Authorization (EUA). This EUA will remain in effect (meaning this test can be used) for the duration of the COVID-19 declaration under Section 564(b)(1) of the Act, 21 U.S.C. section 360bbb-3(b)(1), unless the authorization is terminated or revoked.  Performed at [facility], [address] [place], [place] [address]   MRSA Next Gen by PCR, Nasal     Status: None   Collection Time: 10/11/23  1:14 PM   Specimen: Nasal Mucosa; Nasal Swab  Result Value Ref Range Status   MRSA by PCR Next Gen NOT DETECTED NOT DETECTED Final    Comment: (NOTE) The GeneXpert MRSA Assay (FDA approved for NASAL specimens only), is one component of a comprehensive MRSA colonization surveillance program. It is not intended to diagnose MRSA infection nor to guide or monitor treatment for MRSA infections. Test performance is not FDA approved in patients less than 64 years old. Performed at [facility], [address] [place], [place] [address]      Coagulation Studies: Recent Labs    10/10/23 11-05-2056  LABPROT 15.5*  INR 1.2    Urinalysis: Recent Labs    10/10/23 2057  COLORURINE YELLOW*  LABSPEC 1.016  PHURINE 6.0  GLUCOSEU 150*  HGBUR MODERATE*  BILIRUBINUR NEGATIVE  KETONESUR 5*  PROTEINUR >=300*  NITRITE NEGATIVE  LEUKOCYTESUR NEGATIVE      Imaging: DG Chest 2 View  Result Date: 10/10/2023 CLINICAL DATA:  Shortness of breath. EXAM: CHEST - 2 VIEW COMPARISON:  Chest radiograph dated 09/23/2023. FINDINGS: The heart size and mediastinal contours are within normal limits. There are patchy bilateral airspace opacities, most significant in the right lower lung and left mid lung. Trace pleural effusions may contribute. No pneumothorax. The visualized skeletal structures are unremarkable. IMPRESSION: Patchy bilateral airspace opacities may reflect multifocal pneumonia. Electronically Signed   By: Romona Curls M.D.   On: 10/10/2023 21:26     Medications:      amLODipine  10 mg Oral Daily   atorvastatin  80 mg Oral Daily   calcitRIOL  0.25 mcg Oral Daily   carvedilol  6.25 mg Oral BID WC   Chlorhexidine Gluconate Cloth  6 each Topical Q0600   citalopram  10 mg Oral Daily   heparin  5,000 Units Subcutaneous Q8H   insulin aspart  0-5 Units Subcutaneous QHS   insulin aspart  0-6 Units Subcutaneous TID WC   insulin glargine-yfgn  6 Units Subcutaneous Daily   montelukast  10 mg Oral QHS   pantoprazole  40 mg Oral Daily   sevelamer carbonate  1,600 mg Oral TID WC   acetaminophen **OR** acetaminophen, dextrose, gabapentin, guaiFENesin-codeine, HYDROcodone-acetaminophen, ondansetron **OR** ondansetron (ZOFRAN) IV  Assessment/ Plan:  Emma Thomas is a 26 y.o.  female with past medical history of diabetes, anemia, and diabetes type 1, and end stage renal disease on home hemodialysis dialysis. Patient presents to the ED with cough and fever.   End stage renal disease on home hemodialysis. Last treatment  completed on 10/08/23. Dialysis received yesterday with UF 1L. Next treatment scheduled for Wednesday.  2. Anemia of chronic kidney disease Lab Results  Component Value Date   HGB 7.4 (L) 10/12/2023    Hgb 7.4 today. Will defer blood transfusion to primary team.   3. Secondary Hyperparathyroidism:   Lab Results  Component Value Date   CALCIUM 7.6 (L) 10/11/2023   CAION 0.97 (L) 01/08/2023   PHOS 6.2 (H) 08/26/2023    Prescribed calcitriol and sevelamer outpatient. Will continue to monitor bone minerals.   4. Hypertension with chronic kidney disease. Home regimen consist of amlodipine, bumetamide, carvedilol and metoprolol.  Only receiving amlodipine and carvedilol.    LOS: 2 Taiya Nutting 11/26/20243:39 PM

## 2023-10-12 NOTE — Discharge Summary (Signed)
Physician Discharge Summary   Patient: Emma Thomas MRN: [id number]  DOB: 1996-10-13   Admit:     Date of Admission: 10/10/2023 Admitted from: home   Discharge: Date of discharge: 10/12/23 Disposition: Home Condition at discharge: good  CODE STATUS: FULL CODE     Discharge Physician: Sunnie Nielsen, DO [facility]     PCP: [name]  Recommendations for Outpatient Follow-up:  Follow up with PCP [place] in 1-2 weeks Please obtain labs/tests: CBC, BMP in 1-2 weeks Nephrology as directed for dialysis Endocrinology as directed for diabetes management   Discharge Instructions     Diet - low sodium heart healthy   Complete by: As directed    Diet Carb Modified   Complete by: As directed    Increase activity slowly   Complete by: As directed          Discharge Diagnoses: Principal Problem:   Sepsis (HCC) Active Problems:   HCAP (healthcare-associated pneumonia)   Acute respiratory failure with hypoxia (HCC)   Uncontrolled type 1 diabetes mellitus with hyperglycemia, with long-term current use of insulin (HCC)   Chronic painful diabetic neuropathy (HCC)   ESRD on home hemodialysis (HCC)   Primary hypertension   Chronic home hemodialysis status (HCC)   Anemia due to chronic kidney disease   Chronic diastolic CHF (congestive heart failure) [facility])       Hospital Course: HPI: Emma Thomas is a 27 y.o. female with medical history significant for ESRD on home hemodialysis, anemia of CKD, HTN, type 1 diabetes, HLD, prior history of osteomyelitis, hospitalized a month ago, 10/6 - 08/26/2023 with severe sepsis secondary to CAP, who presents to the ED with cough, shortness of breath and fevers.   Hospital course / significant events:  11/24: admitted to hospitalist service for sepsis d/t pneumonia HCAP. Started on vancomycin, cefepime. Requiring 4L O2 [place] 11/25: on room air this morning. BCx in process. 11/26:  feeling better today, some cough, ambulating well without SOB, occasional nausea but tolerating diet. BCx NGx2d.     Consultants:  Nephrology  Procedures/Surgeries: none      ASSESSMENT & PLAN:   HCAP (healthcare-associated pneumonia) Severe Sepsis w/ acute hypoxic respiratory failure  Vancomycin and cefepime --> levaquin on dc to cover pseudomonas  Symptomatic treatment: antitussives, albuterol as needed   Uncontrolled type 1 diabetes mellitus with hyperglycemia, with long-term current use of insulin (HCC) Diabetic neuropathy Resume home regimen    Primary hypertension Continue carvedilol and amlodipine Resume bumex on discharge    ESRD on home hemodialysis [facility]) Patient does home hemodialysis Nephrology follow up for continuation of dialysis   Anemia due to chronic kidney disease Hemoglobin at baseline  monitor CBC Avoiding transfusion if possible as sh eis renal transplant candidate   Chronic diastolic CHF (congestive heart failure)  Continue beta blocker.   Restarting bumex on discharge  Daily weights                Discharge Instructions  Allergies as of 10/12/2023       Reactions   Losartan Swelling   Angioedema of tongue        Medication List     STOP taking these medications    metoprolol tartrate 25 MG tablet Commonly known as: LOPRESSOR   ondansetron 4 MG tablet Commonly known as: ZOFRAN   promethazine-dextromethorphan 6.25-15 MG/5ML syrup Commonly known as: PROMETHAZINE-DM       TAKE these medications  acetaminophen 500 MG tablet Commonly known as: TYLENOL Take 2 tablets (1,000 mg total) by mouth every 6 (six) hours as needed for mild pain.   albuterol 108 (90 Base) MCG/ACT inhaler Commonly known as: VENTOLIN HFA Inhale 2 puffs into the lungs every 6 (six) hours as needed for wheezing or shortness of breath.   amLODipine 10 MG tablet Commonly known as: NORVASC Take 1 tablet (10 mg total) by mouth daily.    atorvastatin 80 MG tablet Commonly known as: LIPITOR Take 80 mg by mouth daily.   bumetanide 1 MG tablet Commonly known as: BUMEX Take 1 mg by mouth 2 (two) times daily.   calcitRIOL 0.25 MCG capsule Commonly known as: ROCALTROL Take 0.25 mcg by mouth daily.   carvedilol 6.25 MG tablet Commonly known as: COREG Take 6.25 mg by mouth 2 (two) times daily.   citalopram 10 MG tablet Commonly known as: CELEXA Take 10 mg by mouth daily.   guaiFENesin-codeine 100-10 MG/5ML syrup Take 5 mLs by mouth every 12 (twelve) hours as needed for cough (cough not responding to tessalon).   HumaLOG 100 UNIT/ML injection Generic drug: insulin lispro Inject 0-1 mLs (0-100 Units total) into the skin daily. Uses with Insulin Pump   levofloxacin 250 MG tablet Commonly known as: LEVAQUIN Take 1 tablet (250 mg total) by mouth daily for 5 days. Start taking on: October 13, 2023   montelukast 10 MG tablet Commonly known as: SINGULAIR Take 10 mg by mouth at bedtime.   norethindrone-ethinyl estradiol 1-20 MG-MCG tablet Commonly known as: LOESTRIN Take 1 tablet by mouth daily.   oxyCODONE-acetaminophen 5-325 MG tablet Commonly known as: Percocet Take 1 tablet by mouth every 4 (four) hours as needed.   pantoprazole 40 MG tablet Commonly known as: Protonix Take 1 tablet (40 mg total) by mouth daily.   promethazine 12.5 MG tablet Commonly known as: PHENERGAN Take 1 tablet (12.5 mg total) by mouth every 8 (eight) hours as needed for nausea or vomiting. What changed: when to take this   sevelamer carbonate 800 MG tablet Commonly known as: RENVELA Take 800 mg by mouth 3 (three) times daily with meals.   SUMAtriptan 50 MG tablet Commonly known as: IMITREX Take 50 mg by mouth daily as needed for migraine.          Allergies  Allergen Reactions   Losartan Swelling    Angioedema of tongue     Subjective: pt reports some nausea and cough, is ambulating well, tolerating  diet   Discharge Exam: BP (!) 154/100 (BP Location: Left Arm)   Pulse 91   Temp 99.3 F (37.4 C) (Oral)   Resp 16   Ht 5\' 6"  (1.676 m)   Wt 71.9 kg   LMP 08/23/2023 (Approximate)   SpO2 98%   BMI 25.58 kg/m  General: Pt is alert, awake, not in acute distress Cardiovascular: RRR, S1/S2 +, no rubs, no gallops Respiratory: CTA bilaterally, no wheezing, no rhonchi Abdominal: Soft, NT, ND, bowel sounds + Extremities: no edema, no cyanosis     The results of significant diagnostics from this hospitalization (including imaging, microbiology, ancillary and laboratory) are listed below for reference.     Microbiology: Recent Results (from the past 240 hour(s))  Culture, blood (Routine x 2)     Status: None (Preliminary result)   Collection Time: 10/10/23  8:57 PM   Specimen: Left Antecubital; Blood  Result Value Ref Range Status   Specimen Description LEFT ANTECUBITAL  Final   Special Requests  Final    BOTTLES DRAWN AEROBIC AND ANAEROBIC Blood Culture adequate volume   Culture   Final    NO GROWTH 2 DAYS Performed at [facility], [address] [place], [place] [address]    Report Status PENDING  Incomplete  Culture, blood (Routine x 2)     Status: None (Preliminary result)   Collection Time: 10/10/23  8:57 PM   Specimen: BLOOD LEFT FOREARM  Result Value Ref Range Status   Specimen Description BLOOD LEFT FOREARM  Final   Special Requests   Final    BOTTLES DRAWN AEROBIC AND ANAEROBIC Blood Culture results may not be optimal due to an inadequate volume of blood received in culture bottles   Culture   Final    NO GROWTH 2 DAYS Performed at [facility], [address] [place], [place] [address]    Report Status PENDING  Incomplete  Resp panel by RT-PCR (RSV, Flu A&B, Covid) Anterior Nasal Swab     Status: None   Collection Time: 10/10/23  9:33 PM   Specimen: Anterior Nasal Swab  Result Value Ref Range Status   SARS Coronavirus 2 by RT PCR  NEGATIVE NEGATIVE Final    Comment: (NOTE) SARS-CoV-2 target nucleic acids are NOT DETECTED.  The SARS-CoV-2 RNA is generally detectable in upper respiratory specimens during the acute phase of infection. The lowest concentration of SARS-CoV-2 viral copies this assay can detect is 138 copies/mL. A negative result does not preclude SARS-Cov-2 infection and should not be used as the sole basis for treatment or other patient management decisions. A negative result may occur with  improper specimen collection/handling, submission of specimen other than nasopharyngeal swab, presence of viral mutation(s) within the areas targeted by this assay, and inadequate number of viral copies(<138 copies/mL). A negative result must be combined with clinical observations, patient history, and epidemiological information. The expected result is Negative.  Fact Sheet for Patients:  BloggerCourse.com  Fact Sheet for Healthcare Providers:  SeriousBroker.it  This test is no t yet approved or cleared by the [place] FDA and  has been authorized for detection and/or diagnosis of SARS-CoV-2 by FDA under an Emergency Use Authorization (EUA). This EUA will remain  in effect (meaning this test can be used) for the duration of the COVID-19 declaration under Section 564(b)(1) of the Act, 21 U.S.C.section 360bbb-3(b)(1), unless the authorization is terminated  or revoked sooner.       Influenza A by PCR NEGATIVE NEGATIVE Final   Influenza B by PCR NEGATIVE NEGATIVE Final    Comment: (NOTE) The Xpert Xpress SARS-CoV-2/FLU/RSV plus assay is intended as an aid in the diagnosis of influenza from Nasopharyngeal swab specimens and should not be used as a sole basis for treatment. Nasal washings and aspirates are unacceptable for Xpert Xpress SARS-CoV-2/FLU/RSV testing.  Fact Sheet for Patients: BloggerCourse.com  Fact Sheet for  Healthcare Providers: SeriousBroker.it  This test is not yet approved or cleared by the [place] FDA and has been authorized for detection and/or diagnosis of SARS-CoV-2 by FDA under an Emergency Use Authorization (EUA). This EUA will remain in effect (meaning this test can be used) for the duration of the COVID-19 declaration under Section 564(b)(1) of the Act, 21 U.S.C. section 360bbb-3(b)(1), unless the authorization is terminated or revoked.     Resp Syncytial Virus by PCR NEGATIVE NEGATIVE Final    Comment: (NOTE) Fact Sheet for Patients: BloggerCourse.com  Fact Sheet for Healthcare Providers: SeriousBroker.it  This test is not yet approved  or cleared by the [place] and has been authorized for detection and/or diagnosis of SARS-CoV-2 by FDA under an Emergency Use Authorization (EUA). This EUA will remain in effect (meaning this test can be used) for the duration of the COVID-19 declaration under Section 564(b)(1) of the Act, 21 U.S.C. section 360bbb-3(b)(1), unless the authorization is terminated or revoked.  Performed at [facility], [address] [place], [place] [address]   MRSA Next Gen by PCR, Nasal     Status: None   Collection Time: 10/11/23  1:14 PM   Specimen: Nasal Mucosa; Nasal Swab  Result Value Ref Range Status   MRSA by PCR Next Gen NOT DETECTED NOT DETECTED Final    Comment: (NOTE) The GeneXpert MRSA Assay (FDA approved for NASAL specimens only), is one component of a comprehensive MRSA colonization surveillance program. It is not intended to diagnose MRSA infection nor to guide or monitor treatment for MRSA infections. Test performance is not FDA approved in patients less than 27 years old. Performed at [facility], [address] [place], [place] [address]      Labs: BNP (last 3 results) Recent Labs    08/22/23 1120  BNP 925.7*    Basic Metabolic Panel: Recent Labs  Lab 10/10/23 2057 10/11/23 0505  NA 133* 133*  K 3.7 3.6  CL 95* 99  CO2 18* 21*  GLUCOSE 222* 125*  BUN 54* 55*  CREATININE 7.76* 7.70*  CALCIUM 8.4* 7.6*   Liver Function Tests: Recent Labs  Lab 10/10/23 2057  AST 27  ALT 18  ALKPHOS 105  BILITOT 1.6*  PROT 8.7*  ALBUMIN 3.3*   No results for input(s): "LIPASE", "AMYLASE" in the last 168 hours. No results for input(s): "AMMONIA" in the last 168 hours. CBC: Recent Labs  Lab 10/10/23 2057 10/11/23 0505 10/12/23 0758  WBC 29.4* 21.4*  --   NEUTROABS 27.3*  --   --   HGB 9.1* 6.9* 7.4*  HCT 28.1* 21.2* 22.8*  MCV 91.8 93.0  --   PLT 305 271  --    Cardiac Enzymes: No results for input(s): "CKTOTAL", "CKMB", "CKMBINDEX", "TROPONINI" in the last 168 hours. BNP: Invalid input(s): "POCBNP" CBG: Recent Labs  Lab 10/11/23 1137 10/11/23 1156 10/11/23 1832 10/11/23 2110 10/12/23 0818  GLUCAP 45* 167* 184* 219* 374*   D-Dimer No results for input(s): "DDIMER" in the last 72 hours. Hgb A1c No results for input(s): "HGBA1C" in the last 72 hours. Lipid Profile No results for input(s): "CHOL", "HDL", "LDLCALC", "TRIG", "CHOLHDL", "LDLDIRECT" in the last 72 hours. Thyroid function studies No results for input(s): "TSH", "T4TOTAL", "T3FREE", "THYROIDAB" in the last 72 hours.  Invalid input(s): "FREET3" Anemia work up No results for input(s): "VITAMINB12", "FOLATE", "FERRITIN", "TIBC", "IRON", "RETICCTPCT" in the last 72 hours. Urinalysis    Component Value Date/Time   COLORURINE YELLOW (A) 10/10/2023 2057   APPEARANCEUR HAZY (A) 10/10/2023 2057   LABSPEC 1.016 10/10/2023 2057   PHURINE 6.0 10/10/2023 2057   GLUCOSEU 150 (A) 10/10/2023 2057   HGBUR MODERATE (A) 10/10/2023 2057   BILIRUBINUR NEGATIVE 10/10/2023 2057   KETONESUR 5 (A) 10/10/2023 2057   PROTEINUR >=300 (A) 10/10/2023 2057   NITRITE NEGATIVE 10/10/2023 2057   LEUKOCYTESUR NEGATIVE 10/10/2023 2057    Sepsis Labs Recent Labs  Lab 10/10/23 2057 10/11/23 0505  WBC 29.4* 21.4*   Microbiology Recent Results (from the past 240 hour(s))  Culture, blood (Routine x 2)     Status: None (Preliminary result)  Collection Time: 10/10/23  8:57 PM   Specimen: Left Antecubital; Blood  Result Value Ref Range Status   Specimen Description LEFT ANTECUBITAL  Final   Special Requests   Final    BOTTLES DRAWN AEROBIC AND ANAEROBIC Blood Culture adequate volume   Culture   Final    NO GROWTH 2 DAYS Performed at [facility], [address], [place], [place] [address]    Report Status PENDING  Incomplete  Culture, blood (Routine x 2)     Status: None (Preliminary result)   Collection Time: 10/10/23  8:57 PM   Specimen: BLOOD LEFT FOREARM  Result Value Ref Range Status   Specimen Description BLOOD LEFT FOREARM  Final   Special Requests   Final    BOTTLES DRAWN AEROBIC AND ANAEROBIC Blood Culture results may not be optimal due to an inadequate volume of blood received in culture bottles   Culture   Final    NO GROWTH 2 DAYS Performed at [facility], [address], [place], [place] [address]    Report Status PENDING  Incomplete  Resp panel by RT-PCR (RSV, Flu A&B, Covid) Anterior Nasal Swab     Status: None   Collection Time: 10/10/23  9:33 PM   Specimen: Anterior Nasal Swab  Result Value Ref Range Status   SARS Coronavirus 2 by RT PCR NEGATIVE NEGATIVE Final    Comment: (NOTE) SARS-CoV-2 target nucleic acids are NOT DETECTED.  The SARS-CoV-2 RNA is generally detectable in upper respiratory specimens during the acute phase of infection. The lowest concentration of SARS-CoV-2 viral copies this assay can detect is 138 copies/mL. A negative result does not preclude SARS-Cov-2 infection and should not be used as the sole basis for treatment or other patient management decisions. A negative result may occur with  improper specimen collection/handling, submission of  specimen other than nasopharyngeal swab, presence of viral mutation(s) within the areas targeted by this assay, and inadequate number of viral copies(<138 copies/mL). A negative result must be combined with clinical observations, patient history, and epidemiological information. The expected result is Negative.  Fact Sheet for Patients:  BloggerCourse.com  Fact Sheet for Healthcare Providers:  SeriousBroker.it  This test is no t yet approved or cleared by the [place] FDA and  has been authorized for detection and/or diagnosis of SARS-CoV-2 by FDA under an Emergency Use Authorization (EUA). This EUA will remain  in effect (meaning this test can be used) for the duration of the COVID-19 declaration under Section 564(b)(1) of the Act, 21 U.S.C.section 360bbb-3(b)(1), unless the authorization is terminated  or revoked sooner.       Influenza A by PCR NEGATIVE NEGATIVE Final   Influenza B by PCR NEGATIVE NEGATIVE Final    Comment: (NOTE) The Xpert Xpress SARS-CoV-2/FLU/RSV plus assay is intended as an aid in the diagnosis of influenza from Nasopharyngeal swab specimens and should not be used as a sole basis for treatment. Nasal washings and aspirates are unacceptable for Xpert Xpress SARS-CoV-2/FLU/RSV testing.  Fact Sheet for Patients: BloggerCourse.com  Fact Sheet for Healthcare Providers: SeriousBroker.it  This test is not yet approved or cleared by the [place] FDA and has been authorized for detection and/or diagnosis of SARS-CoV-2 by FDA under an Emergency Use Authorization (EUA). This EUA will remain in effect (meaning this test can be used) for the duration of the COVID-19 declaration under Section 564(b)(1) of the Act, 21 U.S.C. section 360bbb-3(b)(1), unless the authorization is terminated or revoked.     Resp  Syncytial Virus by PCR NEGATIVE NEGATIVE Final     Comment: (NOTE) Fact Sheet for Patients: BloggerCourse.com  Fact Sheet for Healthcare Providers: SeriousBroker.it  This test is not yet approved or cleared by the [place] FDA and has been authorized for detection and/or diagnosis of SARS-CoV-2 by FDA under an Emergency Use Authorization (EUA). This EUA will remain in effect (meaning this test can be used) for the duration of the COVID-19 declaration under Section 564(b)(1) of the Act, 21 U.S.C. section 360bbb-3(b)(1), unless the authorization is terminated or revoked.  Performed at [facility], [address] [place], [place] [address]   MRSA Next Gen by PCR, Nasal     Status: None   Collection Time: 10/11/23  1:14 PM   Specimen: Nasal Mucosa; Nasal Swab  Result Value Ref Range Status   MRSA by PCR Next Gen NOT DETECTED NOT DETECTED Final    Comment: (NOTE) The GeneXpert MRSA Assay (FDA approved for NASAL specimens only), is one component of a comprehensive MRSA colonization surveillance program. It is not intended to diagnose MRSA infection nor to guide or monitor treatment for MRSA infections. Test performance is not FDA approved in patients less than 56 years old. Performed at [facility], [address] [place], [place] [address]    Imaging DG Chest 2 View  Result Date: 10/10/2023 CLINICAL DATA:  Shortness of breath. EXAM: CHEST - 2 VIEW COMPARISON:  Chest radiograph dated 09/23/2023. FINDINGS: The heart size and mediastinal contours are within normal limits. There are patchy bilateral airspace opacities, most significant in the right lower lung and left mid lung. Trace pleural effusions may contribute. No pneumothorax. The visualized skeletal structures are unremarkable. IMPRESSION: Patchy bilateral airspace opacities may reflect multifocal pneumonia. Electronically Signed   By: Romona Curls M.D.   On: 10/10/2023 21:26      Time  coordinating discharge: over 30 minutes  SIGNED:  Sunnie Nielsen DO [facility]

## 2023-10-12 NOTE — Inpatient Diabetes Management (Signed)
Inpatient Diabetes Program Recommendations  AACE/ADA: New Consensus Statement on Inpatient Glycemic Control (2015)  Target Ranges:  Prepandial:   less than 140 mg/dL      Peak postprandial:   less than 180 mg/dL (1-2 hours)      Critically ill patients:  140 - 180 mg/dL    Latest Reference Range & Units 10/11/23 00:36 10/11/23 07:59 10/11/23 11:37 10/11/23 11:56 10/11/23 18:32 10/11/23 21:10  Glucose-Capillary 70 - 99 mg/dL 829 (H) 80  6 units Semglee @1049  45 (L) 167 (H) 184 (H) 219 (H)  2 units Novolog   (H): Data is abnormally high (L): Data is abnormally low  Latest Reference Range & Units 10/12/23 08:18  Glucose-Capillary 70 - 99 mg/dL 562 (H)  5 units Novolog   (H): Data is abnormally high  History: Type 1 Diabetes, ESRD   Home DM Meds: Tandem Insulin Pump   Current Orders: Semglee 6 units daily                            Novolog 0-6 units TID AC + HS     MD- Note Hypoglycemia yesterday--Now CBG 374 this AM  May consider slight increase of the Semglee to 8 units Daily (pt gets 14.8 units basal insulin on her home pump)     ENDO: Glean Hess, PA with [facility] Last seen by Telemedicine Visit 04/06/2023 Insulin Pump Settings: Tandem Insulin Pump--14.8 units total basal per 24 hours Normal  12a 0.5, 65, 8 3a 0.5, 65, 8 6a 0.6, 50, 8 11a 0.6, 50, 8 4p 0.8,40, 7 9p 0.6, 55, 8  HIGH 12a 0.8, 40, 7 8a 0.9, 35, 6 4p 1.1, 30, 6 9p 0.9, 35, 7     --Will follow patient during hospitalization--  Ambrose Finland RN, MSN, CDCES Diabetes Coordinator Inpatient Glycemic Control Team Team Pager: [phone] (8a-5p)

## 2023-10-15 LAB — CULTURE, BLOOD (ROUTINE X 2)
Culture: NO GROWTH
Culture: NO GROWTH
Special Requests: ADEQUATE

## 2023-10-18 ENCOUNTER — Other Ambulatory Visit: Payer: Self-pay

## 2023-10-18 ENCOUNTER — Emergency Department
Admission: EM | Admit: 2023-10-18 | Discharge: 2023-10-19 | Disposition: A | Discharge status: Home / Self Care | Payer: 59 | Attending: Emergency Medicine | Admitting: Emergency Medicine

## 2023-10-18 DIAGNOSIS — E1022 Type 1 diabetes mellitus with diabetic chronic kidney disease: Secondary | ICD-10-CM | POA: Insufficient documentation

## 2023-10-18 DIAGNOSIS — E162 Hypoglycemia, unspecified: Secondary | ICD-10-CM

## 2023-10-18 DIAGNOSIS — Z992 Dependence on renal dialysis: Secondary | ICD-10-CM | POA: Diagnosis not present

## 2023-10-18 DIAGNOSIS — I120 Hypertensive chronic kidney disease with stage 5 chronic kidney disease or end stage renal disease: Secondary | ICD-10-CM | POA: Insufficient documentation

## 2023-10-18 DIAGNOSIS — E10649 Type 1 diabetes mellitus with hypoglycemia without coma: Secondary | ICD-10-CM | POA: Insufficient documentation

## 2023-10-18 DIAGNOSIS — N186 End stage renal disease: Secondary | ICD-10-CM | POA: Insufficient documentation

## 2023-10-18 LAB — URINALYSIS, ROUTINE W REFLEX MICROSCOPIC
Bacteria, UA: NONE SEEN
Bilirubin Urine: NEGATIVE
Glucose, UA: 50 mg/dL — AB
Hgb urine dipstick: NEGATIVE
Ketones, ur: NEGATIVE mg/dL
Leukocytes,Ua: NEGATIVE
Nitrite: NEGATIVE
Protein, ur: 100 mg/dL — AB
Specific Gravity, Urine: 1.01 (ref 1.005–1.030)
pH: 8 (ref 5.0–8.0)

## 2023-10-18 LAB — CBC WITH DIFFERENTIAL/PLATELET
Abs Immature Granulocytes: 0.05 10*3/uL (ref 0.00–0.07)
Basophils Absolute: 0.1 10*3/uL (ref 0.0–0.1)
Basophils Relative: 1 %
Eosinophils Absolute: 0.2 10*3/uL (ref 0.0–0.5)
Eosinophils Relative: 2 %
HCT: 28.4 % — ABNORMAL LOW (ref 36.0–46.0)
Hemoglobin: 8.8 g/dL — ABNORMAL LOW (ref 12.0–15.0)
Immature Granulocytes: 1 %
Lymphocytes Relative: 16 %
Lymphs Abs: 1.8 10*3/uL (ref 0.7–4.0)
MCH: 29.2 pg (ref 26.0–34.0)
MCHC: 31 g/dL (ref 30.0–36.0)
MCV: 94.4 fL (ref 80.0–100.0)
Monocytes Absolute: 0.8 10*3/uL (ref 0.1–1.0)
Monocytes Relative: 7 %
Neutro Abs: 8 10*3/uL — ABNORMAL HIGH (ref 1.7–7.7)
Neutrophils Relative %: 73 %
Platelets: 356 10*3/uL (ref 150–400)
RBC: 3.01 MIL/uL — ABNORMAL LOW (ref 3.87–5.11)
RDW: 15.3 % (ref 11.5–15.5)
WBC: 10.9 10*3/uL — ABNORMAL HIGH (ref 4.0–10.5)
nRBC: 0 % (ref 0.0–0.2)

## 2023-10-18 LAB — BASIC METABOLIC PANEL
Anion gap: 13 (ref 5–15)
BUN: 37 mg/dL — ABNORMAL HIGH (ref 6–20)
CO2: 22 mmol/L (ref 22–32)
Calcium: 8.2 mg/dL — ABNORMAL LOW (ref 8.9–10.3)
Chloride: 101 mmol/L (ref 98–111)
Creatinine, Ser: 7.13 mg/dL — ABNORMAL HIGH (ref 0.44–1.00)
GFR, Estimated: 7 mL/min — ABNORMAL LOW (ref >60)
Glucose, Bld: 116 mg/dL — ABNORMAL HIGH (ref 70–99)
Potassium: 3.8 mmol/L (ref 3.5–5.1)
Sodium: 136 mmol/L (ref 135–145)

## 2023-10-18 LAB — CBG MONITORING, ED
Glucose-Capillary: 31 mg/dL — CL (ref 70–99)
Glucose-Capillary: 153 mg/dL — ABNORMAL HIGH (ref 70–99)
Glucose-Capillary: 255 mg/dL — ABNORMAL HIGH (ref 70–99)

## 2023-10-18 MED ORDER — DEXTROSE 50 % IV SOLN
INTRAVENOUS | Status: AC
  Filled 2023-10-18: qty 50

## 2023-10-18 MED ORDER — DEXTROSE 50 % IV SOLN
1.0000 | Freq: Once | INTRAVENOUS | Status: AC
  Administered 2023-10-18: 50 mL via INTRAVENOUS

## 2023-10-18 MED ORDER — OXYCODONE-ACETAMINOPHEN 5-325 MG PO TABS
1.0000 | ORAL_TABLET | Freq: Once | ORAL | Status: AC
  Administered 2023-10-18: 1 via ORAL
  Filled 2023-10-18: qty 1

## 2023-10-18 NOTE — ED Notes (Signed)
 CCMD contacted for cardiac monitoring.

## 2023-10-18 NOTE — ED Triage Notes (Addendum)
Pt arrives via EMS from home for a hypoglycemia episode. Husband found her unresponsive. Blood sugar was 54 for EMS. Given 200 mL D10 by EMS and 1 cracker. Pt alert and oriented upon arrival.

## 2023-10-18 NOTE — Discharge Instructions (Signed)
Continue your normal insulin regimen.  Monitor your glucose closely over the next few days.  Make sure you are eating regularly.  Follow-up with your primary care provider.  Return to the ER for new, worsening, or persistent severe low blood glucose readings, lightheadedness, feel like you are going to pass out, or any other new or worsening symptoms that concern you.

## 2023-10-18 NOTE — ED Notes (Signed)
Pt given turkey sandwich tray 

## 2023-10-18 NOTE — ED Provider Notes (Signed)
[facility] Provider Note    Event Date/Time   First MD Initiated Contact with Patient 10/18/23 1910     (approximate)   History   Hypoglycemia   HPI  Emma Thomas is a 27 y.o. female with a history of ESRD on home hemodialysis, anemia, hypertension, type 1 diabetes, and hyperlipidemia who presents with hyperglycemia.  The patient herself states that she just remembers waking up with EMS attending to her.  She states she is feeling fine now.  Per EMS, the patient's husband found her unresponsive.  Blood sugar in the field was 54.  She received D10 and a cracker.  The patient states that she is on an insulin pump and also sometimes uses a long-acting insulin at night if needed.  She denies any change to her insulin regimen or routine today and states she did not take any extra insulin this morning and ate normally.  I reviewed the past medical records.  The patient was most recently admitted to the hospital service at the end of last month, discharged on 11/26.  Per the hospitalist notes she presented with sepsis due to a CAP.   Physical Exam   Triage Vital Signs: ED Triage Vitals  Encounter Vitals Group     BP 10/18/23 1857 (!) 145/99     Systolic BP Percentile --      Diastolic BP Percentile --      Pulse Rate 10/18/23 1857 84     Resp 10/18/23 1857 14     Temp --      Temp src --      SpO2 10/18/23 1857 100 %     Weight 10/18/23 1905 140 lb (63.5 kg)     Height 10/18/23 1905 5\' 6"  (1.676 m)     Head Circumference --      Peak Flow --      Pain Score 10/18/23 1905 0     Pain Loc --      Pain Education --      Exclude from Growth Chart --     Most recent vital signs: Vitals:   10/18/23 2230 10/18/23 2300  BP: 116/75 120/78  Pulse: 95 95  Resp: 17 15  Temp:    SpO2: 100% 100%     General: And oriented, no distress.  CV:  Good peripheral perfusion.  Resp:  Normal effort.  Abd:  No distention.  Other:  Jaundice or scleral icterus.   Moist mucous membranes.   ED Results / Procedures / Treatments   Labs (all labs ordered are listed, but only abnormal results are displayed) Labs Reviewed  BASIC METABOLIC PANEL - Abnormal; Notable for the following components:      Result Value   Glucose, Bld 116 (*)    BUN 37 (*)    Creatinine, Ser 7.13 (*)    Calcium 8.2 (*)    GFR, Estimated 7 (*)    All other components within normal limits  CBC WITH DIFFERENTIAL/PLATELET - Abnormal; Notable for the following components:   WBC 10.9 (*)    RBC 3.01 (*)    Hemoglobin 8.8 (*)    HCT 28.4 (*)    Neutro Abs 8.0 (*)    All other components within normal limits  URINALYSIS, ROUTINE W REFLEX MICROSCOPIC - Abnormal; Notable for the following components:   Color, Urine YELLOW (*)    APPearance CLEAR (*)    Glucose, UA 50 (*)    Protein, ur 100 (*)  All other components within normal limits  CBG MONITORING, ED - Abnormal; Notable for the following components:   Glucose-Capillary 31 (*)    All other components within normal limits  CBG MONITORING, ED - Abnormal; Notable for the following components:   Glucose-Capillary 153 (*)    All other components within normal limits  CBG MONITORING, ED - Abnormal; Notable for the following components:   Glucose-Capillary 255 (*)    All other components within normal limits     EKG  ED ECG REPORT I, Dionne Bucy, the attending physician, personally viewed and interpreted this ECG.  Date: 10/18/2023 EKG Time: 1905 Rate: 83 Rhythm: normal sinus rhythm QRS Axis: normal Intervals: Prolonged QTc ST/T Wave abnormalities: normal Narrative Interpretation: no evidence of acute ischemia    RADIOLOGY    PROCEDURES:  Critical Care performed: No  Procedures   MEDICATIONS ORDERED IN ED: Medications  dextrose 50 % solution 50 mL (50 mLs Intravenous Given 10/18/23 1916)  oxyCODONE-acetaminophen (PERCOCET/ROXICET) 5-325 MG per tablet 1 tablet (1 tablet Oral Given 10/18/23  2111)     IMPRESSION / MDM / ASSESSMENT AND PLAN / ED COURSE  I reviewed the triage vital signs and the nursing notes.  27 year old female with PMH as noted above presents with acute onset of unresponsiveness, found to be hypoglycemic.  She had a recurrent episode of hypoglycemia in the ED although was alert at that time.  Differential diagnosis includes, but is not limited to, hypoglycemia related to her insulin, AKI, electrolyte abnormality, vasovagal syncope, dehydration, UTI or other infection.  We will obtain lab workup, give the patient food, and observe for that for the next few hours.  If she has recurrent hypoglycemia she may need admission for further monitoring.  Patient's presentation is most consistent with acute presentation with potential threat to life or bodily function.  The patient is on the cardiac monitor to evaluate for evidence of arrhythmia and/or significant heart rate changes.  ----------------------------------------- 11:10 PM on 10/18/2023 -----------------------------------------  Lab workup is reassuring.  CBC shows chronic anemia.  BMP shows creatinine of 7 which is baseline.  Electrolytes are unremarkable.  Glucose has remained in a normal range for the last 4 hours.  I did consider whether the patient may benefit from inpatient admission given the hypoglycemia earlier especially since the etiology is unclear.  I offered this to the patient, however she would prefer to go home which I feel is also reasonable.  I counseled her on the results of the workup and follow-up plan.  I gave strict return precautions and she expressed understanding.   FINAL CLINICAL IMPRESSION(S) / ED DIAGNOSES   Final diagnoses:  Hypoglycemia     Rx / DC Orders   ED Discharge Orders     None        Note:  This document was prepared using Dragon voice recognition software and may include unintentional dictation errors.    Dionne Bucy, MD 10/18/23 2311

## 2024-03-05 ENCOUNTER — Other Ambulatory Visit: Payer: Self-pay

## 2024-03-05 ENCOUNTER — Emergency Department
Admission: EM | Admit: 2024-03-05 | Discharge: 2024-03-06 | Disposition: A | Discharge status: Home / Self Care | Attending: Emergency Medicine | Admitting: Emergency Medicine

## 2024-03-05 DIAGNOSIS — D649 Anemia, unspecified: Secondary | ICD-10-CM

## 2024-03-05 DIAGNOSIS — R739 Hyperglycemia, unspecified: Secondary | ICD-10-CM

## 2024-03-05 DIAGNOSIS — E1022 Type 1 diabetes mellitus with diabetic chronic kidney disease: Secondary | ICD-10-CM | POA: Diagnosis not present

## 2024-03-05 DIAGNOSIS — D631 Anemia in chronic kidney disease: Secondary | ICD-10-CM | POA: Insufficient documentation

## 2024-03-05 DIAGNOSIS — X58XXXA Exposure to other specified factors, initial encounter: Secondary | ICD-10-CM | POA: Insufficient documentation

## 2024-03-05 DIAGNOSIS — N186 End stage renal disease: Secondary | ICD-10-CM | POA: Diagnosis not present

## 2024-03-05 DIAGNOSIS — Z992 Dependence on renal dialysis: Secondary | ICD-10-CM | POA: Insufficient documentation

## 2024-03-05 DIAGNOSIS — S91301A Unspecified open wound, right foot, initial encounter: Secondary | ICD-10-CM | POA: Insufficient documentation

## 2024-03-05 LAB — CBC WITH DIFFERENTIAL/PLATELET
Abs Immature Granulocytes: 0.03 10*3/uL (ref 0.00–0.07)
Basophils Absolute: 0.1 10*3/uL (ref 0.0–0.1)
Basophils Relative: 1 %
Eosinophils Absolute: 0.6 10*3/uL — ABNORMAL HIGH (ref 0.0–0.5)
Eosinophils Relative: 6 %
HCT: 17.8 % — ABNORMAL LOW (ref 36.0–46.0)
Hemoglobin: 5.7 g/dL — ABNORMAL LOW (ref 12.0–15.0)
Immature Granulocytes: 0 %
Lymphocytes Relative: 15 %
Lymphs Abs: 1.5 10*3/uL (ref 0.7–4.0)
MCH: 27.7 pg (ref 26.0–34.0)
MCHC: 32 g/dL (ref 30.0–36.0)
MCV: 86.4 fL (ref 80.0–100.0)
Monocytes Absolute: 0.7 10*3/uL (ref 0.1–1.0)
Monocytes Relative: 7 %
Neutro Abs: 7.2 10*3/uL (ref 1.7–7.7)
Neutrophils Relative %: 71 %
Platelets: 343 10*3/uL (ref 150–400)
RBC: 2.06 MIL/uL — ABNORMAL LOW (ref 3.87–5.11)
RDW: 14.6 % (ref 11.5–15.5)
WBC: 10.1 10*3/uL (ref 4.0–10.5)
nRBC: 0 % (ref 0.0–0.2)

## 2024-03-05 LAB — BASIC METABOLIC PANEL WITH GFR
Anion gap: 14 (ref 5–15)
BUN: 44 mg/dL — ABNORMAL HIGH (ref 6–20)
CO2: 24 mmol/L (ref 22–32)
Calcium: 8.3 mg/dL — ABNORMAL LOW (ref 8.9–10.3)
Chloride: 93 mmol/L — ABNORMAL LOW (ref 98–111)
Creatinine, Ser: 8.05 mg/dL — ABNORMAL HIGH (ref 0.44–1.00)
GFR, Estimated: 6 mL/min — ABNORMAL LOW (ref >60)
Glucose, Bld: 303 mg/dL — ABNORMAL HIGH (ref 70–99)
Potassium: 4.7 mmol/L (ref 3.5–5.1)
Sodium: 131 mmol/L — ABNORMAL LOW (ref 135–145)

## 2024-03-05 LAB — RESP PANEL BY RT-PCR (RSV, FLU A&B, COVID)  RVPGX2
Influenza A by PCR: NEGATIVE
Influenza B by PCR: NEGATIVE
Resp Syncytial Virus by PCR: NEGATIVE
SARS Coronavirus 2 by RT PCR: NEGATIVE

## 2024-03-05 MED ORDER — CEPHALEXIN 500 MG PO CAPS
500.0000 mg | ORAL_CAPSULE | Freq: Once | ORAL | Status: AC
  Administered 2024-03-06: 500 mg via ORAL
  Filled 2024-03-05: qty 1

## 2024-03-05 MED ORDER — ACETAMINOPHEN 500 MG PO TABS
1000.0000 mg | ORAL_TABLET | Freq: Once | ORAL | Status: AC
  Administered 2024-03-06: 1000 mg via ORAL
  Filled 2024-03-05: qty 2

## 2024-03-05 MED ORDER — OXYCODONE HCL 5 MG PO TABS
5.0000 mg | ORAL_TABLET | Freq: Once | ORAL | Status: AC
  Administered 2024-03-06: 5 mg via ORAL
  Filled 2024-03-05: qty 1

## 2024-03-05 MED ORDER — SODIUM CHLORIDE 0.9 % IV SOLN: 10.0000 mL/h | Freq: Once | INTRAVENOUS | Status: DC

## 2024-03-05 NOTE — ED Notes (Signed)
 [facility] [employer] [facility] sent down.

## 2024-03-05 NOTE — ED Provider Notes (Signed)
 [facility] Provider Note    Event Date/Time   First MD Initiated Contact with Patient 03/05/24 2338     (approximate)   History   flu like symptoms   HPI  Emma Thomas is a 29 y.o. female   Past medical history of end-stage renal disease on dialysis still makes urine, diabetes type 1, prior osteomyelitis of foot requiring amputation, who presents to the Emergency Department with profound fatigue progressively worsening reminiscent of the times in the past where her blood counts were low requiring transfusion.  She denies any GI bleeding.  She states that she had some catheter dysfunctions about the bleeding that have now resolved, which may have led to lower blood levels as well.  She has had transfusions in the past, approximately once or twice a year, previously attributed to her kidney dysfunction.  She has a sore to her right foot and she has been picking at it and losing skin in the area.  She had some subjective fever a few days ago but none today.  He denies any chest pain or shortness of breath.  She denies any respiratory infectious symptoms or GI or GU complaints.  She does have diffuse muscle soreness throughout her whole body.   External Medical Documents Reviewed: Hospital notes from admission in August 2024 when she was admitted for sepsis/DKA      Physical Exam   Triage Vital Signs: ED Triage Vitals  Encounter Vitals Group     BP 03/05/24 2213 130/80     Systolic BP Percentile --      Diastolic BP Percentile --      Pulse Rate 03/05/24 2213 95     Resp 03/05/24 2213 16     Temp 03/05/24 2213 99.5 F (37.5 C)     Temp Source 03/05/24 2213 Oral     SpO2 03/05/24 2213 98 %     Weight --      Height 03/05/24 2214 5\' 6"  (1.676 m)     Head Circumference --      Peak Flow --      Pain Score 03/05/24 2214 0     Pain Loc --      Pain Education --      Exclude from Growth Chart --     Most recent vital signs: Vitals:    03/05/24 2213  BP: 130/80  Pulse: 95  Resp: 16  Temp: 99.5 F (37.5 C)  SpO2: 98%    General: Awake, no distress.  CV:  Good peripheral perfusion.  Resp:  Normal effort.  Abd:  No distention.  Other:  She appears pale.  Her lungs are clear and her abdomen is soft and nontender.  She does have this wound to her right great toe and to the sole of her left foot without any surrounding cellulitic changes and she does have good pulses to both extremities.  Both lower extremities appear edematous.         ED Results / Procedures / Treatments   Labs (all labs ordered are listed, but only abnormal results are displayed) Labs Reviewed  CBC WITH DIFFERENTIAL/PLATELET - Abnormal; Notable for the following components:      Result Value   RBC 2.06 (*)    Hemoglobin 5.7 (*)    HCT 17.8 (*)    Eosinophils Absolute 0.6 (*)    All other components within normal limits  BASIC METABOLIC PANEL WITH GFR - Abnormal; Notable for the following components:  Sodium 131 (*)    Chloride 93 (*)    Glucose, Bld 303 (*)    BUN 44 (*)    Creatinine, Ser 8.05 (*)    Calcium  8.3 (*)    GFR, Estimated 6 (*)    All other components within normal limits  RESP PANEL BY RT-PCR (RSV, FLU A&B, COVID)  RVPGX2  LACTIC ACID, PLASMA  LACTIC ACID, PLASMA  URINALYSIS, W/ REFLEX TO CULTURE (INFECTION SUSPECTED)  TYPE AND SCREEN  PREPARE RBC (CROSSMATCH)     I ordered and reviewed the above labs they are notable for her hemoglobin is 5.7.   RADIOLOGY I independently reviewed and interpreted x-ray of the foot and see no obvious subcutaneous air I also reviewed radiologist's formal read.   PROCEDURES:  Critical Care performed: Yes, see critical care procedure note(s)  .Critical Care  Performed by: Buell Carmin, MD Authorized by: Buell Carmin, MD   Critical care provider statement:    Critical care time (minutes):  30   Critical care was time spent personally by me on the following activities:   Development of treatment plan with patient or surrogate, discussions with consultants, evaluation of patient's response to treatment, examination of patient, ordering and review of laboratory studies, ordering and review of radiographic studies, ordering and performing treatments and interventions, pulse oximetry, re-evaluation of patient's condition and review of old charts    MEDICATIONS ORDERED IN ED: Medications  0.9 %  sodium chloride  infusion (has no administration in time range)  cephALEXin  (KEFLEX ) capsule 500 mg (500 mg Oral Given 03/06/24 0007)  acetaminophen  (TYLENOL ) tablet 1,000 mg (1,000 mg Oral Given 03/06/24 0007)  oxyCODONE  (Oxy IR/ROXICODONE ) immediate release tablet 5 mg (5 mg Oral Given 03/06/24 0018)    IMPRESSION / MDM / ASSESSMENT AND PLAN / ED COURSE  I reviewed the triage vital signs and the nursing notes.                                Patient's presentation is most consistent with acute presentation with potential threat to life or bodily function.  Differential diagnosis includes, but is not limited to, anemia due to her renal dysfunction, symptomatic anemia requiring transfusion, acute blood loss anemia less likely, cellulitis, osteomyelitis, skin infection   The patient is on the cardiac monitor to evaluate for evidence of arrhythmia and/or significant heart rate changes.  MDM:     Regarding her profound fatigue I think is likely due to her low hemoglobin which is in the fives today requiring blood transfusion.  I have ordered for blood transfusion type and screen etc.  She has had a history of skin infections leading to sepsis, history of amputations, and she has these skin changes to her right great toe that albeit do not look overtly infected, are at high risk given her medical comorbidities, and her reports of subjective fever over the last several days I strongly encouraged admission to address both her potential skin infection and her anemia.  She  refused, and she says that she has a podiatrist outpatient visit tomorrow already.  She has no fever currently and no leukocytosis, and despite my offer and encouragement for admission she would instead like to be discharged and follow-up with her podiatrist in the morning for the skin changes and potential infection and has full decision-making capacity.  She is amenable for staying for the blood transfusion but would like to be discharged afterwards.  I  will write her prescription for Keflex  in the meantime.        FINAL CLINICAL IMPRESSION(S) / ED DIAGNOSES   Final diagnoses:  Anemia due to chronic kidney disease, unspecified CKD stage  Symptomatic anemia  Open wound of right foot, initial encounter     Rx / DC Orders   ED Discharge Orders          Ordered    cephALEXin  (KEFLEX ) 500 MG capsule  4 times daily        03/06/24 0054             Note:  This document was prepared using Dragon voice recognition software and may include unintentional dictation errors.    Buell Carmin, MD 03/06/24 [phone]

## 2024-03-05 NOTE — ED Notes (Signed)
 Pink and grey sent down

## 2024-03-05 NOTE — ED Provider Notes (Incomplete)
 [facility] Provider Note    Event Date/Time   First MD Initiated Contact with Patient 03/05/24 2338     (approximate)   History   flu like symptoms   HPI  Emma Thomas is a 28 y.o. female   Past medical history of ***    Independent Historian contributed to assessment above: ***  External Medical Documents Reviewed: ***      Physical Exam   Triage Vital Signs: ED Triage Vitals  Encounter Vitals Group     BP 03/05/24 2213 130/80     Systolic BP Percentile --      Diastolic BP Percentile --      Pulse Rate 03/05/24 2213 95     Resp 03/05/24 2213 16     Temp 03/05/24 2213 99.5 F (37.5 C)     Temp Source 03/05/24 2213 Oral     SpO2 03/05/24 2213 98 %     Weight --      Height 03/05/24 2214 5\' 6"  (1.676 m)     Head Circumference --      Peak Flow --      Pain Score 03/05/24 2214 0     Pain Loc --      Pain Education --      Exclude from Growth Chart --     Most recent vital signs: Vitals:   03/05/24 2213  BP: 130/80  Pulse: 95  Resp: 16  Temp: 99.5 F (37.5 C)  SpO2: 98%    General: Awake, no distress. *** CV:  Good peripheral perfusion. *** Resp:  Normal effort. *** Abd:  No distention. *** Other:  ***   ED Results / Procedures / Treatments   Labs (all labs ordered are listed, but only abnormal results are displayed) Labs Reviewed  CBC WITH DIFFERENTIAL/PLATELET - Abnormal; Notable for the following components:      Result Value   RBC 2.06 (*)    Hemoglobin 5.7 (*)    HCT 17.8 (*)    Eosinophils Absolute 0.6 (*)    All other components within normal limits  BASIC METABOLIC PANEL WITH GFR - Abnormal; Notable for the following components:   Sodium 131 (*)    Chloride 93 (*)    Glucose, Bld 303 (*)    BUN 44 (*)    Creatinine, Ser 8.05 (*)    Calcium  8.3 (*)    GFR, Estimated 6 (*)    All other components within normal limits  RESP PANEL BY RT-PCR (RSV, FLU A&B, COVID)  RVPGX2  TYPE AND SCREEN     I  ordered and reviewed the above labs they are notable for ***  EKG  ED ECG REPORT I, Buell Carmin, the attending physician, personally viewed and interpreted this ECG.   Date: 03/05/2024  EKG Time: ***  Rate: ***  Rhythm: {ekg findings:315101}  Axis: ***  Intervals:{conduction defects:17367}  ST&T Change: ***    RADIOLOGY I independently reviewed and interpreted *** I also reviewed radiologist's formal read.   PROCEDURES:  Critical Care performed: {CriticalCareYesNo:19197::"Yes, see critical care procedure note(s)","No"}  Procedures   MEDICATIONS ORDERED IN ED: Medications - No data to display  External physician / consultants:  I spoke with *** regarding care plan for this patient.   IMPRESSION / MDM / ASSESSMENT AND PLAN / ED COURSE  I reviewed the triage vital signs and the nursing notes.  Patient's presentation is most consistent with {EM COPA:27473}  Differential diagnosis includes, but is not limited to, ***   ***The patient is on the cardiac monitor to evaluate for evidence of arrhythmia and/or significant heart rate changes.  MDM:  ***  I considered hospitalization for admission or observation ***        FINAL CLINICAL IMPRESSION(S) / ED DIAGNOSES   Final diagnoses:  None     Rx / DC Orders   ED Discharge Orders     None        Note:  This document was prepared using Dragon voice recognition software and may include unintentional dictation errors.

## 2024-03-05 NOTE — ED Triage Notes (Addendum)
 Pt to ed from home via POV for flu like symptoms since Monday. Pt had high fever this week. Pt is a home dialysis pt. Pt also feels her "blood count is low". Pt has had to have a blood transfusion in the past for same. Pt is caox4, in no acute distress. Pt has only missed one treatment this week due to having her fistula worked on ,

## 2024-03-06 ENCOUNTER — Emergency Department

## 2024-03-06 LAB — URINALYSIS, W/ REFLEX TO CULTURE (INFECTION SUSPECTED)
Bilirubin Urine: NEGATIVE
Glucose, UA: >=500 mg/dL — AB
Hgb urine dipstick: NEGATIVE
Ketones, ur: NEGATIVE mg/dL
Leukocytes,Ua: NEGATIVE
Nitrite: NEGATIVE
Protein, ur: >=300 mg/dL — AB
Specific Gravity, Urine: 1.008 (ref 1.005–1.030)
pH: 9 — ABNORMAL HIGH (ref 5.0–8.0)

## 2024-03-06 LAB — LACTIC ACID, PLASMA: Lactic Acid, Venous: 1.2 mmol/L (ref 0.5–1.9)

## 2024-03-06 LAB — PREPARE RBC (CROSSMATCH)

## 2024-03-06 MED ORDER — CEPHALEXIN 500 MG PO CAPS
500.0000 mg | ORAL_CAPSULE | Freq: Four times a day (QID) | ORAL | 0 refills | Status: AC
Start: 2024-03-06 — End: 2024-03-11

## 2024-03-06 NOTE — Discharge Instructions (Addendum)
 It is very important that you follow-up with your podiatrist as scheduled tomorrow morning to check out the foot wounds.  In the meantime continue taking antibiotic I have prescribed.  You had a blood transfusion for low hemoglobin.  Follow-up with your primary doctor to recheck these levels within the next 1 to 2 weeks.   Thank you for choosing us  for your health care today!  Please see your primary doctor this week for a follow up appointment.   If you have any new, worsening, or unexpected symptoms call your doctor right away or come back to the emergency department for reevaluation.  It was my pleasure to care for you today.   Arron Large Margery Sheets, MD

## 2024-03-06 NOTE — ED Notes (Signed)
 Pt big R toe wrapped in xeroform gauze and rolled gauze per MD Margery Sheets.

## 2024-03-07 ENCOUNTER — Ambulatory Visit
Attending: Student in an Organized Health Care Education/Training Program | Admitting: Student in an Organized Health Care Education/Training Program

## 2024-03-07 ENCOUNTER — Encounter: Payer: Self-pay | Admitting: Student in an Organized Health Care Education/Training Program

## 2024-03-07 VITALS — BP 127/85 | HR 93 | Temp 97.3°F | Resp 16 | Ht 66.0 in | Wt 163.1 lb

## 2024-03-07 DIAGNOSIS — G5791 Unspecified mononeuropathy of right lower limb: Secondary | ICD-10-CM | POA: Diagnosis present

## 2024-03-07 DIAGNOSIS — G894 Chronic pain syndrome: Secondary | ICD-10-CM | POA: Diagnosis not present

## 2024-03-07 DIAGNOSIS — G8929 Other chronic pain: Secondary | ICD-10-CM

## 2024-03-07 DIAGNOSIS — M792 Neuralgia and neuritis, unspecified: Secondary | ICD-10-CM | POA: Diagnosis present

## 2024-03-07 DIAGNOSIS — M25562 Pain in left knee: Secondary | ICD-10-CM

## 2024-03-07 DIAGNOSIS — Z794 Long term (current) use of insulin: Secondary | ICD-10-CM

## 2024-03-07 DIAGNOSIS — E1140 Type 2 diabetes mellitus with diabetic neuropathy, unspecified: Secondary | ICD-10-CM

## 2024-03-07 LAB — TYPE AND SCREEN: Unit division: 0

## 2024-03-07 LAB — BPAM RBC
Blood Product Expiration Date: 202505222359
ISSUE DATE / TIME: 202504210217
Unit Type and Rh: 5100

## 2024-03-07 MED ORDER — PREGABALIN 50 MG PO CAPS: 50.0000 mg | ORAL_CAPSULE | Freq: Three times a day (TID) | ORAL | 1 refills | Status: DC

## 2024-03-07 NOTE — Progress Notes (Signed)
 Safety precautions to be maintained throughout the outpatient stay will include: orient to surroundings, keep bed in low position, maintain call bell within reach at all times, provide assistance with transfer out of bed and ambulation.

## 2024-03-07 NOTE — Progress Notes (Signed)
 PROVIDER NOTE: Interpretation of information contained herein should be left to medically-trained personnel. Specific patient instructions are provided elsewhere under "Patient Instructions" section of medical record. This document was created in part using AI and STT-dictation technology, any transcriptional errors that may result from this process are unintentional.  Patient: Emma Thomas  Service: E/M   PCP: [facility]  DOB: 02-05-1996  DOS: 03/07/2024  Provider: Cephus Collin, MD  MRN: [id number]  Delivery: Face-to-face  Specialty: Interventional Pain Management  Type: Established Patient  Setting: Ambulatory outpatient facility  Specialty designation: 09  Referring Prov.: [place]  Location: Outpatient office facility       HPI  [name], a 28 y.o. 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 Emma Thomas primary complain today is Foot Pain  Pain Assessment: Severity of Neuropathic pain is reported as a 7 /10. Location: Foot Right, Left/Denies. Onset: More than a month ago. Quality: Burning, Constant, Tightness, Tingling, Numbness. Timing: Constant. Modifying factor(s): Denies. Vitals:  height is 5\' 6"  (1.676 m) and weight is 163 lb 2.3 oz (74 kg). Her temporal temperature is 97.3 F (36.3 C) (abnormal). Her blood pressure is 127/85 and her pulse is 93. Her respiration is 16 and oxygen saturation is 100%.  BMI: Estimated body mass index is 26.33 kg/m as calculated from the following:   Height as of this encounter: 5\' 6"  (1.676 m).   Weight as of this encounter: 163 lb 2.3 oz (74 kg). Last encounter: 04/22/2023. Last procedure: Visit date not found.  Reason for encounter: Patient follows up today.  Her last visit in her initial visit with me was on 04/22/2023.  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  She has significant swelling of her left foot and is dealing with fairly severe  chronic pain.  At her last visit we had discussed Qutenza and get started her on Lyrica .  She has not noticed any significant pain relief with Lyrica  so we discussed increasing her dose to 50 mg 3 times a day and try Qutenza for chronic painful diabetic neuropathy.  She is also found benefit with oxycodone  in the past.  She states that she would only take it at night to help with sleep as that is when her pain is most severe.   HPI from initial clinic visit: [name] is being evaluated for possible interventional pain management therapies for the treatment of her chronic pain.    Emma Thomas is a 28 year old female with end-stage renal disease currently on dialysis who presents with a chief complaint of bilateral foot pain which she describes as burning and tingling and throbbing along with left knee pain.  Her bilateral foot pain is related to diabetic neuropathy.  She has tried gabapentin  in the past with limited response.  She has tried a home directed exercise program which was not helpful.  She has not tried Lyrica .   She also endorses left knee pain that is chronic in nature.  It is worse with weightbearing.  No trauma noted.  She has tried acetaminophen  for her left knee with limited response.    ROS  Constitutional: Denies any fever or chills Gastrointestinal: No reported hemesis, hematochezia, vomiting, or acute GI distress Musculoskeletal:  Bilateral foot pain Neurological:  Bilateral foot paresthesias, left worse than right  Medication Review  SUMAtriptan , acetaminophen , albuterol , amLODipine , atorvastatin , bumetanide , calcitRIOL , carvedilol , cephALEXin , citalopram , guaiFENesin -codeine , insulin  lispro, montelukast , norethindrone -ethinyl estradiol ,  pregabalin , promethazine , and sevelamer  carbonate  History Review  Allergy: Emma Thomas is allergic to losartan . Drug: Emma Thomas  reports no history of drug use. Alcohol:  reports that she does not currently use alcohol after a past usage  of about 2.0 standard drinks of alcohol per week. Tobacco:  reports that she has never smoked. She has been exposed to tobacco smoke. She has never used smokeless tobacco. Social: Emma Thomas  reports that she has never smoked. She has been exposed to tobacco smoke. She has never used smokeless tobacco. She reports that she does not currently use alcohol after a past usage of about 2.0 standard drinks of alcohol per week. She reports that she does not use drugs. Medical:  has a past medical history of Anemia, Diabetes mellitus without complication (HCC), Essential hypertension, Headache, Hypertension (03/04/2013), Neurologic disorder, Neuromuscular disorder (HCC), Recurrent UTI, and Renal disorder. Surgical: Emma Thomas  has a past surgical history that includes abscess removal; Nexplanon (01/2011); DIALYSIS/PERMA CATHETER INSERTION (N/A, 04/24/2022); DIALYSIS/PERMA CATHETER REMOVAL (N/A, 06/02/2022); Insertion of dialysis catheter (Right); CAPD insertion (N/A, 06/09/2022); Umbilical hernia repair (N/A, 06/09/2022); CAPD revision (N/A, 07/16/2022); Amputation (Left, 11/23/2022); CAPD revision (N/A, 11/24/2022); IR Fluoro Guide CV Line Right (11/27/2022); IR US  Guide Vasc Access Right (11/27/2022); CAPD insertion (N/A, 12/15/2022); CAPD removal (N/A, 12/15/2022); DIALYSIS/PERMA CATHETER REMOVAL (N/A, 12/28/2022); CAPD removal (N/A, 01/08/2023); Irrigation and debridement foot (Left, 06/19/2023); Irrigation and debridement foot (Left, 07/09/2023); and Amputation (Left, 07/09/2023). Family: family history includes Breast cancer (age of onset: 52) in her mother; Diabetes type II in her paternal grandmother; Lung cancer in her maternal grandmother.  Laboratory Chemistry Profile   Renal Lab Results  Component Value Date   BUN 44 (H) 03/05/2024   CREATININE 8.05 (H) 03/05/2024   LABCREA 58 02/13/2020   BCR 15 10/25/2019   GFRAA 55 (L) 02/13/2020   GFRNONAA 6 (L) 03/05/2024    Hepatic Lab Results  Component Value Date    AST 27 10/10/2023   ALT 18 10/10/2023   ALBUMIN 3.3 (L) 10/10/2023   ALKPHOS 105 10/10/2023   LIPASE 21 04/19/2022    Electrolytes Lab Results  Component Value Date   NA 131 (L) 03/05/2024   K 4.7 03/05/2024   CL 93 (L) 03/05/2024   CALCIUM  8.3 (L) 03/05/2024   MG 2.2 07/11/2023   PHOS 6.2 (H) 08/26/2023    Bone Lab Results  Component Value Date   VD25OH 20.67 (L) 06/19/2023    Inflammation (CRP: Acute Phase) (ESR: Chronic Phase) Lab Results  Component Value Date   CRP 12.9 (H) 06/18/2023   ESRSEDRATE 118 (H) 06/18/2023   LATICACIDVEN 1.2 03/06/2024         Note: Above Lab results reviewed.  Recent Imaging Review  DG Toe Great Right CLINICAL DATA:  Pain and infection.  High fevers.  EXAM: RIGHT GREAT TOE  COMPARISON:  None Available.  FINDINGS: Posttraumatic or surgical change about the first toe distal phalanx. No radiographic evidence of osteomyelitis. No acute fracture or dislocation. Adjacent soft tissue swelling. Vascular calcifications.  IMPRESSION: Posttraumatic or surgical change about the first toe distal phalanx. Soft tissue swelling about the first toe without radiographic evidence of osteomyelitis.  Electronically Signed   By: Rozell Cornet M.D.   On: 03/06/2024 01:00 Note: Reviewed        Physical Exam  General appearance: Well nourished, well developed, and well hydrated. In no apparent acute distress Mental status: Alert, oriented x 3 (person, place, & time)  Respiratory: No evidence of acute respiratory distress Eyes: PERLA Vitals: BP 127/85 (Patient Position: Sitting, Cuff Size: Normal)   Pulse 93   Temp (!) 97.3 F (36.3 C) (Temporal)   Resp 16   Ht 5\' 6"  (1.676 m)   Wt 163 lb 2.3 oz (74 kg)   SpO2 100%   BMI 26.33 kg/m  BMI: Estimated body mass index is 26.33 kg/m as calculated from the following:   Height as of this encounter: 5\' 6"  (1.676 m).   Weight as of this encounter: 163 lb 2.3 oz (74 kg). Ideal: Ideal body  weight: 59.3 kg (130 lb 11.7 oz) Adjusted ideal body weight: 65.2 kg (143 lb 11.1 oz)    Amputation of left big toe.  Swelling of left foot.  Pain with left foot movement including plantarflexion, dorsiflexion, inversion, eversion  Assessment   Diagnosis  1. Chronic painful diabetic neuropathy (HCC)   2. Neuropathic pain of foot, left   3. Neuropathy of foot, right   4. Chronic pain of left knee   5. Chronic pain syndrome       Plan of Care  1. Chronic painful diabetic neuropathy (HCC) (Primary) - pregabalin  (LYRICA ) 50 MG capsule; Take 1 capsule (50 mg total) by mouth 3 (three) times daily.  Dispense: 90 capsule; Refill: 1 - NEUROLYSIS; Future  2. Neuropathic pain of foot, left - pregabalin  (LYRICA ) 50 MG capsule; Take 1 capsule (50 mg total) by mouth 3 (three) times daily.  Dispense: 90 capsule; Refill: 1  3. Neuropathy of foot, right - pregabalin  (LYRICA ) 50 MG capsule; Take 1 capsule (50 mg total) by mouth 3 (three) times daily.  Dispense: 90 capsule; Refill: 1  4. Chronic pain of left knee - pregabalin  (LYRICA ) 50 MG capsule; Take 1 capsule (50 mg total) by mouth 3 (three) times daily.  Dispense: 90 capsule; Refill: 1  5. Chronic pain syndrome - Compliance Drug Analysis, Ur - pregabalin  (LYRICA ) 50 MG capsule; Take 1 capsule (50 mg total) by mouth 3 (three) times daily.  Dispense: 90 capsule; Refill: 1     Pharmacotherapy (Medications Ordered): Meds ordered this encounter  Medications   pregabalin  (LYRICA ) 50 MG capsule    Sig: Take 1 capsule (50 mg total) by mouth 3 (three) times daily.    Dispense:  90 capsule    Refill:  1    Fill one day early if pharmacy is closed on scheduled refill date. May substitute for generic if available.   Orders:  Orders Placed This Encounter  Procedures   NEUROLYSIS    Please order Qutenza patches    Standing Status:   Future    Expiration Date:   06/06/2024    Where will this procedure be performed?:   [facility]    Compliance Drug Analysis, Ur    Volume: 30 ml(s). Minimum 3 ml of urine is needed. Document temperature of fresh sample. Indications: Long term (current) use of opiate analgesic (U98.119) Test#: [phone] (Comprehensive Profile)    Release to patient:   Immediate   Follow-up plan:   Return in about 3 weeks (around 03/28/2024) for Qutenza .    Recent Visits No visits were found meeting these conditions. Showing recent visits within past 90 days and meeting all other requirements Today's Visits Date Type Provider Dept  03/07/24 Office Visit Cephus Collin, MD [facility]  Showing today's visits and meeting all other requirements Future Appointments No visits were found meeting these conditions. Showing future appointments within next  90 days and meeting all other requirements  I discussed the assessment and treatment plan with the patient. The patient was provided an opportunity to ask questions and all were answered. The patient agreed with the plan and demonstrated an understanding of the instructions.  Patient advised to call back or seek an in-person evaluation if the symptoms or condition worsens.  Duration of encounter: .  Total time on encounter, as per AMA guidelines included both the face-to-face and non-face-to-face time personally spent by the physician and/or other qualified health care professional(s) on the day of the encounter (includes time in activities that require the physician or other qualified health care professional and does not include time in activities normally performed by clinical staff). Physician's time may include the following activities when performed: Preparing to see the patient (e.g., pre-charting review of records, searching for previously ordered imaging, lab work, and nerve conduction tests) Review of prior analgesic pharmacotherapies. Reviewing PMP Interpreting ordered tests (e.g., lab work, imaging, nerve conduction  tests) Performing post-procedure evaluations, including interpretation of diagnostic procedures Obtaining and/or reviewing separately obtained history Performing a medically appropriate examination and/or evaluation Counseling and educating the patient/family/caregiver Ordering medications, tests, or procedures Referring and communicating with other health care professionals (when not separately reported) Documenting clinical information in the electronic or other health record Independently interpreting results (not separately reported) and communicating results to the patient/ family/caregiver Care coordination (not separately reported)  Note by: Cephus Collin, MD (TTS and AI technology used. I apologize for any typographical errors that were not detected and corrected.) Date: 03/07/2024; Time: 2:28 PM

## 2024-03-10 LAB — COMPLIANCE DRUG ANALYSIS, UR

## 2024-03-27 ENCOUNTER — Encounter: Payer: Self-pay | Admitting: Student in an Organized Health Care Education/Training Program

## 2024-03-27 ENCOUNTER — Ambulatory Visit
Attending: Student in an Organized Health Care Education/Training Program | Admitting: Student in an Organized Health Care Education/Training Program

## 2024-03-27 DIAGNOSIS — E1140 Type 2 diabetes mellitus with diabetic neuropathy, unspecified: Secondary | ICD-10-CM | POA: Diagnosis present

## 2024-03-27 MED ORDER — OXYCODONE HCL 5 MG PO TABS: 5.0000 mg | ORAL_TABLET | Freq: Every day | ORAL | 0 refills | Status: AC | PRN

## 2024-03-27 MED ORDER — CAPSAICIN-CLEANSING GEL 8 % EX KIT
4.0000 | PACK | Freq: Once | CUTANEOUS | Status: AC
  Administered 2024-03-27: 4 via TOPICAL
  Filled 2024-03-27: qty 4

## 2024-03-27 NOTE — Progress Notes (Signed)
 Safety precautions to be maintained throughout the outpatient stay will include: orient to surroundings, keep bed in low position, maintain call bell within reach at all times, provide assistance with transfer out of bed and ambulation.

## 2024-03-27 NOTE — Patient Instructions (Signed)

## 2024-03-27 NOTE — Progress Notes (Signed)
 PROVIDER NOTE: Interpretation of information contained herein should be left to medically-trained personnel. Specific patient instructions are provided elsewhere under "Patient Instructions" section of medical record. This document was created in part using STT-dictation technology, any transcriptional errors that may result from this process are unintentional.  Patient: Emma Thomas Type: Established DOB: 07/16/1996 MRN: [id number] PCP: [facility]  Service: Procedure DOS: 03/27/2024 Setting: Ambulatory Location: Ambulatory outpatient facility Delivery: Face-to-face Provider: Cephus Collin, MD Specialty: Interventional Pain Management Specialty designation: 09 Location: Outpatient facility Ref. Prov.: Cephus Collin, MD       Interventional Therapy   Type: Qutenza Neurolysis #1  Laterality:  Bilateral Area treated: Feet Imaging Guidance: None Anesthesia/analgesia/anxiolysis/sedation: None required Medication (Right): Qutenza (capsaicin 8%) topical system Medication (Left): Qutenza (capsaicin 8%) topical system Date: 03/27/2024 Performed by: Cephus Collin, MD Rationale (medical necessity): procedure needed and proper for the treatment of Emma Thomas's medical symptoms and needs. Indication: Painful diabetic peripheral neuralgia (DPN) (ICD-10-CM:E11.40) severe enough to impact quality of life or function. 1. Chronic painful diabetic neuropathy (HCC)    NAS-11 Pain score:   Pre-procedure: 6 /10   Post-procedure: 6 /10     Position / Prep / Materials:  Position: Supine  Materials: Qutenza Kit (4 patches)  H&P (Pre-op Assessment):  Ms. Stankovic is a 28 y.o. (year old), female patient, seen today for interventional treatment. 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 Emma Thomas has a current medication list which includes the following prescription(s): acetaminophen , albuterol , amlodipine , atorvastatin , bumetanide , calcitriol , carvedilol , citalopram , guaifenesin -codeine , humalog , montelukast , norethindrone -ethinyl estradiol , oxycodone , pregabalin , promethazine , sevelamer  carbonate, and sumatriptan , and the following Facility-Administered Medications: capsaicin topical system. Her primarily concern today is the Foot Pain (Bilateral )  Initial Vital Signs:  Pulse/HCG Rate: 86  Temp: (!) 97.3 F (36.3 C) Resp: 16 BP: 129/85 SpO2: 100 %  BMI: Estimated body mass index is 24.21 kg/m as calculated from the following:   Height as of this encounter: 5\' 6"  (1.676 m).   Weight as of this encounter: 150 lb (68 kg).  Risk Assessment: Allergies: Reviewed. She is allergic to losartan .  Allergy Precautions: None required Coagulopathies: Reviewed. None identified.  Blood-thinner therapy: None at this time Active Infection(s): Reviewed. None identified. Emma Thomas is afebrile  Site Confirmation: Emma Thomas was asked to confirm the procedure and laterality before marking the site Procedure checklist: Completed Consent: Before the procedure and under the influence of no sedative(s), amnesic(s), or anxiolytics, the patient was informed of the treatment options, risks and possible complications. To fulfill our ethical and legal obligations, as recommended by the American  Medical Association's Code of Ethics, I have informed the patient of my clinical impression; the nature and purpose of the treatment or procedure; the risks, benefits, and possible complications of the intervention; the alternatives, including doing nothing; the risk(s) and benefit(s) of the alternative treatment(s) or procedure(s); and the risk(s) and benefit(s) of doing nothing. The patient was provided information about the general risks and possible complications  associated with the procedure. These may include, but are not limited to: failure to achieve desired goals, infection, bleeding, organ or nerve damage, allergic reactions, paralysis, and death. In addition, the patient was informed of those risks and complications associated to the procedure, such as failure to decrease pain; infection; bleeding; organ or nerve damage with subsequent damage to sensory, motor, and/or autonomic systems, resulting in permanent pain, numbness, and/or weakness of one or several areas of the body; allergic reactions; (i.e.: anaphylactic reaction); and/or death. Furthermore, the patient was informed of those risks and complications associated with the medications. These include, but are not limited to: allergic reactions (i.e.: anaphylactic or anaphylactoid reaction(s)); adrenal axis suppression; blood sugar elevation that in diabetics may result in ketoacidosis or comma; water retention that in patients with history of congestive heart failure may result in shortness of breath, pulmonary edema, and decompensation with resultant heart failure; weight gain; swelling or edema; medication-induced neural toxicity; particulate matter embolism and blood vessel occlusion with resultant organ, and/or nervous system infarction; and/or aseptic necrosis of one or more joints. Finally, the patient was informed that Medicine is not an exact science; therefore, there is also the possibility of unforeseen or unpredictable risks and/or  possible complications that may result in a catastrophic outcome. The patient indicated having understood very clearly. We have given the patient no guarantees and we have made no promises. Enough time was given to the patient to ask questions, all of which were answered to the patient's satisfaction. Ms. Rosenburg has indicated that she wanted to continue with the procedure. Attestation: I, the ordering provider, attest that I have discussed with the patient the benefits, risks, side-effects, alternatives, likelihood of achieving goals, and potential problems during recovery for the procedure that I have provided informed consent. Date  Time: 03/27/2024  9:09 AM  Pre-Procedure Preparation:  Monitoring: As per clinic protocol. Respiration, ETCO2, SpO2, BP, heart rate and rhythm monitor placed and checked for adequate function Safety Precautions: Patient was assessed for positional comfort and pressure points before starting the procedure. Time-out: I initiated and conducted the "Time-out" before starting the procedure, as per protocol. The patient was asked to participate by confirming the accuracy of the "Time Out" information. Verification of the correct person, site, and procedure were performed and confirmed by me, the nursing staff, and the patient. "Time-out" conducted as per Joint Commission's Universal Protocol (UP.01.01.01). Time: 0923 Start Time: 0931 hrs.  Description/Narrative of Procedure:          Region: Distal lower extremities Target Area: Sensory peripheral nerves affected by diabetic peripheral neuropathy Site: Feet Approach: Percutaneous  No./Series: Not applicable  Type: Percutaneous  Purpose: Therapeutic  Region: Distal lower extremities  Start Time: 0931 hrs.  Description of the Procedure: Protocol guidelines were followed. The patient was assisted into a comfortable position.  Informed consent was obtained in the patient monitored in the usual manner.  All questions were  answered prior to the procedure.  They Qutenza patches were applied to the affected area and then covered with the wrap.  The Patient was kept under observation until the treatment was completed.  The patches were removed and the treated area was inspected.  Vitals:   03/27/24 0911  BP: 129/85  Pulse: 86  Resp: 16  Temp: (!) 97.3 F (36.3 C)  TempSrc: Temporal  SpO2: 100%  Weight: 150 lb (68 kg)  Height: 5\' 6"  (1.676 m)     End Time: 1021 (patient able to tolerate x 30 minutes) hrs.  Type of Imaging Technique: None used Indication(s): N/A Exposure Time: No patient exposure Contrast: None used. Fluoroscopic Guidance: N/A Ultrasound Guidance: N/A Interpretation: N/A  Post-operative Assessment:  Post-procedure Vital Signs:  Pulse/HCG Rate: 86  Temp: (!) 97.3 F (36.3 C) Resp: 16 BP: 129/85 SpO2: 100 %  EBL: None  Complications: No immediate post-treatment complications observed by team, or reported by patient.  Note: The patient tolerated the entire procedure well. A repeat set of vitals were taken after the procedure and the patient was kept under observation following institutional policy, for this type of procedure. Post-procedural neurological assessment was performed, showing return to baseline, prior to discharge. The patient was provided with post-procedure discharge instructions, including a section on how to identify potential problems. Should any problems arise concerning this procedure, the patient was given instructions to immediately contact us , at any time, without hesitation. In any case, we plan to contact the patient by telephone for a follow-up status report regarding this interventional procedure.  Comments:  No additional relevant information.  Plan of Care (POC)  Orders:  No orders of the defined types were placed in this encounter.  PMP was checked and reviewed, prescription for oxycodone  5 mg daily as needed  Medications ordered for procedure: Meds  ordered this encounter  Medications   capsaicin topical system 8 % patch 4 patch    Disregard order if transmitted to pharmacy. Qutenza Kit to be used from [facility].   oxyCODONE  (OXY IR/ROXICODONE ) 5 MG immediate release tablet    Sig: Take 1 tablet (5 mg total) by mouth daily as needed for severe pain (pain score 7-10). Must last 30 days.    Dispense:  30 tablet    Refill:  0    Chronic Pain: STOP Act (Not applicable) Fill 1 day early if closed on refill date. Avoid benzodiazepines within 8 hours of opioids   Medications administered: Roderick A. Montalvo had no medications administered during this visit.  See the medical record for exact dosing, route, and time of administration.  Follow-up plan:   Return in about 8 weeks (around 05/22/2024) for PPE, F2F.       Qutenza 03/27/24    Recent Visits Date Type Provider Dept  03/07/24 Office Visit Cephus Collin, MD [facility]  Showing recent visits within past 90 days and meeting all other requirements Today's Visits Date Type Provider Dept  03/27/24 Procedure visit Cephus Collin, MD [facility]  Showing today's visits and meeting all other requirements Future Appointments No visits were found meeting these conditions. Showing future appointments within next 90 days and meeting all other requirements  Disposition: Discharge home  Discharge (Date  Time): 03/27/2024; 1023 hrs.   Primary Care Physician: [facility] Location: [facility] Note by: Cephus Collin, MD (TTS technology used. I apologize for any typographical errors that were not detected and corrected.) Date: 03/27/2024; Time: 10:28 AM  Disclaimer:  Medicine is not an Visual merchandiser. The only guarantee in medicine is that nothing is guaranteed. It is important to note that the decision to proceed with this intervention was based on the information collected from the patient. The Data and conclusions  were drawn from the patient's questionnaire, the interview, and the physical examination. Because the information was provided in large part by the patient, it cannot be guaranteed that it has not been purposely or unconsciously manipulated. Every effort has been made to obtain as much relevant data as possible for  this evaluation. It is important to note that the conclusions that lead to this procedure are derived in large part from the available data. Always take into account that the treatment will also be dependent on availability of resources and existing treatment guidelines, considered by other Pain Management Practitioners as being common knowledge and practice, at the time of the intervention. For Medico-Legal purposes, it is also important to point out that variation in procedural techniques and pharmacological choices are the acceptable norm. The indications, contraindications, technique, and results of the above procedure should only be interpreted and judged by a Board-Certified Interventional Pain Specialist with extensive familiarity and expertise in the same exact procedure and technique.

## 2024-03-27 NOTE — Telephone Encounter (Signed)
 Spoke with a person at [facility], they received a rejection from [facility], stating Dr. Rhesa Celeste needs to "sign up with Medicaid.' I informed them that he prescribes this medication frequently without this issue. They gave me a number to Medicaid. I will call.

## 2024-03-28 ENCOUNTER — Telehealth: Payer: Self-pay | Admitting: *Deleted

## 2024-03-28 NOTE — Telephone Encounter (Signed)
 No problems post Qutenza.

## 2024-04-04 ENCOUNTER — Ambulatory Visit
Admission: RE | Admit: 2024-04-04 | Discharge: 2024-04-04 | Disposition: A | Discharge status: Home / Self Care | Source: Ambulatory Visit | Attending: Nephrology | Admitting: Nephrology

## 2024-04-04 DIAGNOSIS — N186 End stage renal disease: Secondary | ICD-10-CM

## 2024-04-04 MED ORDER — SODIUM CHLORIDE 0.9% IV SOLUTION: Freq: Once | INTRAVENOUS | Status: DC

## 2024-04-05 ENCOUNTER — Ambulatory Visit
Admission: RE | Admit: 2024-04-05 | Discharge: 2024-04-05 | Disposition: A | Discharge status: Home / Self Care | Source: Ambulatory Visit | Attending: Nephrology | Admitting: Nephrology

## 2024-04-05 DIAGNOSIS — N186 End stage renal disease: Secondary | ICD-10-CM | POA: Diagnosis present

## 2024-04-05 DIAGNOSIS — D631 Anemia in chronic kidney disease: Secondary | ICD-10-CM | POA: Diagnosis not present

## 2024-04-05 LAB — PREPARE RBC (CROSSMATCH)

## 2024-04-05 LAB — HEMOGLOBIN: Hemoglobin: 5.6 g/dL — ABNORMAL LOW (ref 12.0–15.0)

## 2024-04-05 MED ORDER — SODIUM CHLORIDE 0.9% IV SOLUTION
Freq: Once | INTRAVENOUS | Status: AC
Start: 2024-04-05 — End: 2024-04-05

## 2024-04-06 LAB — BPAM RBC
Blood Product Expiration Date: 202506172359
ISSUE DATE / TIME: 202505211106
Unit Type and Rh: 5100

## 2024-04-06 LAB — TYPE AND SCREEN
ABO/RH(D): O POS
Antibody Screen: NEGATIVE
Unit division: 0

## 2024-05-17 ENCOUNTER — Other Ambulatory Visit: Payer: Self-pay | Admitting: Student in an Organized Health Care Education/Training Program

## 2024-05-17 DIAGNOSIS — G8929 Other chronic pain: Secondary | ICD-10-CM

## 2024-05-17 DIAGNOSIS — G5791 Unspecified mononeuropathy of right lower limb: Secondary | ICD-10-CM

## 2024-05-17 DIAGNOSIS — E1140 Type 2 diabetes mellitus with diabetic neuropathy, unspecified: Secondary | ICD-10-CM

## 2024-05-17 DIAGNOSIS — M792 Neuralgia and neuritis, unspecified: Secondary | ICD-10-CM

## 2024-05-17 DIAGNOSIS — G894 Chronic pain syndrome: Secondary | ICD-10-CM

## 2024-05-22 ENCOUNTER — Ambulatory Visit: Admitting: Student in an Organized Health Care Education/Training Program

## 2024-05-23 ENCOUNTER — Encounter: Payer: Self-pay | Admitting: Student in an Organized Health Care Education/Training Program

## 2024-05-23 ENCOUNTER — Ambulatory Visit
Attending: Student in an Organized Health Care Education/Training Program | Admitting: Student in an Organized Health Care Education/Training Program

## 2024-05-23 VITALS — BP 116/78 | HR 93 | Temp 97.3°F | Resp 18 | Ht 66.0 in | Wt 167.5 lb

## 2024-05-23 DIAGNOSIS — G894 Chronic pain syndrome: Secondary | ICD-10-CM | POA: Insufficient documentation

## 2024-05-23 DIAGNOSIS — G8929 Other chronic pain: Secondary | ICD-10-CM | POA: Diagnosis present

## 2024-05-23 DIAGNOSIS — M25562 Pain in left knee: Secondary | ICD-10-CM | POA: Insufficient documentation

## 2024-05-23 DIAGNOSIS — M792 Neuralgia and neuritis, unspecified: Secondary | ICD-10-CM | POA: Insufficient documentation

## 2024-05-23 DIAGNOSIS — E1140 Type 2 diabetes mellitus with diabetic neuropathy, unspecified: Secondary | ICD-10-CM | POA: Insufficient documentation

## 2024-05-23 DIAGNOSIS — G5791 Unspecified mononeuropathy of right lower limb: Secondary | ICD-10-CM | POA: Insufficient documentation

## 2024-05-23 DIAGNOSIS — Z794 Long term (current) use of insulin: Secondary | ICD-10-CM

## 2024-05-23 MED ORDER — OXYCODONE HCL 5 MG PO TABS: 5.0000 mg | ORAL_TABLET | Freq: Every day | ORAL | 0 refills | Status: AC | PRN

## 2024-05-23 MED ORDER — PREGABALIN 50 MG PO CAPS: 50.0000 mg | ORAL_CAPSULE | Freq: Two times a day (BID) | ORAL | 2 refills | Status: DC

## 2024-05-23 NOTE — Progress Notes (Signed)
 PROVIDER NOTE: Interpretation of information contained herein should be left to medically-trained personnel. Specific patient instructions are provided elsewhere under Patient Instructions section of medical record. This document was created in part using AI and STT-dictation technology, any transcriptional errors that may result from this process are unintentional.  Patient: Emma Thomas  Service: E/M   PCP: [facility]  DOB: 09-18-1996  DOS: 05/23/2024  Provider: Wallie Sherry, MD  MRN: [id number]  Delivery: Face-to-face  Specialty: Interventional Pain Management  Type: Established Patient  Setting: Ambulatory outpatient facility  Specialty designation: 09  Referring Prov.: [place]  Location: Outpatient office facility       History of present illness (HPI) Emma Thomas, a 28 y.o. 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 [name] primary complain today is Foot Pain (Numbness and tingling, burning, in Left foot )   Pain Assessment: Severity of Chronic pain is reported as a 4 /10. Location: Foot Left/Denies. Onset: More than a month ago. Quality: Burning, Numbness, Tingling. Timing: Intermittent. Modifying factor(s): Procedure. Vitals:  height is 5' 6 (1.676 m) and weight is 167 lb 8.8 oz (76 kg). Her temperature is 97.3 F (36.3 C) (abnormal). Her blood pressure is 116/78 and her pulse is 93. Her respiration is 18 and oxygen saturation is 100%.  BMI: Estimated body mass index is 27.04 kg/m as calculated from the following:   Height as of this encounter: 5' 6 (1.676 m).   Weight as of this encounter: 167 lb 8.8 oz (76 kg).  Last encounter: 03/07/2024. Last procedure: 03/27/2024.  Reason for encounter:   History of Present Illness   Emma Thomas is a 28 year old female who presents with chronic pain management issues.  She has been experiencing chronic pain and previously tried Qutenza   for pain management. However, she experienced significant burning, describing it as intolerable, and is not interested in repeating this treatment due to the discomfort it caused.  She is currently taking oxycodone , initially prescribed at three capsules a day, but found this dosage made her excessively sleepy. She has adjusted the dosage to 50 mg twice a day, which she finds more manageable. She requires a refill for this medication.  Additionally, she takes opium  at night as her pain tends to worsen during this time. Over the past two weeks, she has been taking slightly more than usual at night to manage her symptoms.       Pharmacotherapy Assessment   Oxycodone  5 mg daily as needed; primarily takes it at night Monitoring: [place] PMP: PDMP reviewed during this encounter.       Pharmacotherapy: No side-effects or adverse reactions reported. Compliance: No problems identified. Effectiveness: Clinically acceptable.  No notes on file  UDS:  Summary  Date Value Ref Range Status  03/07/2024 FINAL  Final    Comment:    ==================================================================== Compliance Drug Analysis, Ur ==================================================================== Specimen Alert Not Detected result may be consistent with the time of last use noted for this medication. AS NEEDED. (Codeine ) ==================================================================== Test                             Result       Flag       Units  Drug Present and Declared for Prescription Verification   Acetaminophen   PRESENT      EXPECTED  Drug Present not Declared for Prescription Verification   Alpha-hydroxymidazolam         98           UNEXPECTED ng/mg creat    Alpha-hydroxymidazolam is an expected metabolite of midazolam .    Source of midazolam  is a scheduled prescription medication.    Noroxycodone                   233          UNEXPECTED ng/mg creat    Noroxycodone is  an expected metabolite of oxycodone . Sources of    oxycodone  include scheduled prescription medications.    Diphenhydramine                 PRESENT      UNEXPECTED  Drug Absent but Declared for Prescription Verification   Codeine                         Not Detected UNEXPECTED ng/mg creat   Pregabalin                      Not Detected UNEXPECTED   Citalopram                      Not Detected UNEXPECTED   Promethazine                    Not Detected UNEXPECTED   Guaifenesin                     Not Detected UNEXPECTED ==================================================================== Test                      Result    Flag   Units      Ref Range   Creatinine              63               mg/dL      >=79 ==================================================================== Declared Medications:  The flagging and interpretation on this report are based on the  following declared medications.  Unexpected results may arise from  inaccuracies in the declared medications.   **Note: The testing scope of this panel includes these medications:   Citalopram  (Celexa )  Codeine   Guaifenesin   Pregabalin  (Lyrica )  Promethazine  (Phenergan )   **Note: The testing scope of this panel does not include small to  moderate amounts of these reported medications:   Acetaminophen  (Tylenol )   **Note: The testing scope of this panel does not include the  following reported medications:   Albuterol  (Ventolin  HFA)  Amlodipine  (Norvasc )  Atorvastatin  (Lipitor )  Bumetanide  (Bumex )  Calcitriol  (Rocaltrol )  Carvedilol  (Coreg )  Cephalexin  (Keflex )  Ethinyl Estradiol   Insulin  (Humalog )  Montelukast  (Singulair )  Norethindrone   Sevelamer  (Renvela )  Sumatriptan  (Imitrex ) ==================================================================== For clinical consultation, please call [phone]. ====================================================================     No results found for: CBDTHCR No  results found for: D8THCCBX No results found for: D9THCCBX  ROS  Constitutional: Denies any fever or chills Gastrointestinal: No reported hemesis, hematochezia, vomiting, or acute GI distress Musculoskeletal: Denies any acute onset joint swelling, redness, loss of ROM, or weakness Neurological: Paresthesias bilateral feet  Medication Review  SUMAtriptan , acetaminophen , amLODipine , atorvastatin , bumetanide , calcitRIOL , carvedilol , citalopram , insulin  lispro, montelukast , norethindrone -ethinyl estradiol , oxyCODONE , pregabalin , promethazine , and sevelamer  carbonate  History Review  Allergy: Ms. Trego is allergic to losartan . Drug: Ms. Urbach  reports no history of drug use. Alcohol:  reports that she does not currently use alcohol after a past usage of about 2.0 standard drinks of alcohol per week. Tobacco:  reports that she has never smoked. She has been exposed to tobacco smoke. She has never used smokeless tobacco. Social: Ms. Offer  reports that she has never smoked. She has been exposed to tobacco smoke. She has never used smokeless tobacco. She reports that she does not currently use alcohol after a past usage of about 2.0 standard drinks of alcohol per week. She reports that she does not use drugs. Medical:  has a past medical history of Anemia, Diabetes mellitus without complication (HCC), Essential hypertension, Headache, Hypertension (03/04/2013), Neurologic disorder, Neuromuscular disorder (HCC), Recurrent UTI, and Renal disorder. Surgical: Ms. 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 Family: family history includes Breast cancer (age of onset: 63) in her mother; Diabetes type II in her paternal grandmother; Lung cancer in her maternal grandmother.  Laboratory Chemistry Profile   Renal Lab Results  Component Value Date   BUN 44 (H) 03/05/2024   CREATININE 8.05 (H) 03/05/2024   LABCREA 58 02/13/2020   BCR 15 10/25/2019   GFRAA 55 (L) 02/13/2020   GFRNONAA 6 (L) 03/05/2024    Hepatic Lab Results  Component Value Date   AST 27 10/10/2023   ALT 18 10/10/2023   ALBUMIN 3.3 (L) 10/10/2023   ALKPHOS 105 10/10/2023   LIPASE 21 04/19/2022    Electrolytes Lab Results  Component Value Date   NA 131 (L) 03/05/2024   K 4.7 03/05/2024   CL 93 (L) 03/05/2024   CALCIUM  8.3 (L) 03/05/2024   MG 2.2 07/11/2023   PHOS 6.2 (H) 08/26/2023    Bone Lab Results  Component Value Date   VD25OH 20.67 (L) 06/19/2023    Inflammation (CRP: Acute Phase) (ESR: Chronic Phase) Lab Results  Component Value Date   CRP 12.9 (H) 06/18/2023   ESRSEDRATE 118 (H) 06/18/2023   LATICACIDVEN 1.2 03/06/2024         Note: Above Lab results reviewed.  Recent Imaging Review  DG Toe Great Right CLINICAL DATA:  Pain and infection.  High fevers.  EXAM: RIGHT GREAT TOE  COMPARISON:  None Available.  FINDINGS: Posttraumatic or surgical change about the first toe distal phalanx. No radiographic evidence of osteomyelitis. No acute fracture or dislocation. Adjacent soft tissue swelling. Vascular calcifications.  IMPRESSION: Posttraumatic or surgical change about the first toe distal phalanx. Soft tissue swelling about the first toe without radiographic evidence of  osteomyelitis.  Electronically Signed   By: Norman Gatlin M.D.   On: 03/06/2024 01:00 Note: Reviewed        Physical Exam  Vitals: BP 116/78   Pulse 93   Temp (!) 97.3 F (36.3 C)   Resp 18   Ht 5' 6 (1.676 m)   Wt 167 lb 8.8 oz (76 kg)   SpO2 100%   BMI 27.04 kg/m  BMI: Estimated  body mass index is 27.04 kg/m as calculated from the following:   Height as of this encounter: 5' 6 (1.676 m).   Weight as of this encounter: 167 lb 8.8 oz (76 kg). Ideal: Ideal body weight: 59.3 kg (130 lb 11.7 oz) Adjusted ideal body weight: 66 kg (145 lb 7.4 oz) General appearance: Well nourished, well developed, and well hydrated. In no apparent acute distress Mental status: Alert, oriented x 3 (person, place, & time)       Respiratory: No evidence of acute respiratory distress Eyes: PERLA    Assessment   Diagnosis Status  1. Chronic painful diabetic neuropathy (HCC)   2. Neuropathic pain of foot, left   3. Neuropathy of foot, right   4. Chronic pain of left knee   5. Chronic pain syndrome    Controlled Controlled Controlled   Updated Problems: No problems updated.  Plan of Care  Problem-specific:  Assessment and Plan    Chronic pain   Chronic pain is managed with Qutenza  patches and oral medications. She experiences a significant burning sensation with Qutenza , a topical capsaicin  treatment, and is hesitant to continue. Advised that repeated exposure might reduce this sensation. Currently on oxycodone  and PRN for pain management. Oxycodone  is used primarily at night for increased pain, with a limit of 30 tablets per month. Discussed the importance of managing medication use to avoid running out before the end of the month. Prescribe oxycodone  with a quantity of 30 for the next two months, with one prescription available for pickup today and another next month. Schedule follow-up appointment in three months for Qutenza  application and medication review. Complete controlled  substance agreement today.      Plan: Repeat Qutenza  every 3 months with Dr Marcelino, follow-up with Seema for medication management Ms. NEFERTARI REBMAN has a current medication list which includes the following long-term medication(s): amlodipine , atorvastatin , carvedilol , citalopram , humalog , montelukast , norethindrone -ethinyl estradiol , promethazine , and pregabalin .  Pharmacotherapy (Medications Ordered): Meds ordered this encounter  Medications   pregabalin  (LYRICA ) 50 MG capsule    Sig: Take 1 capsule (50 mg total) by mouth 2 (two) times daily.    Dispense:  60 capsule    Refill:  2    Fill one day early if pharmacy is closed on scheduled refill date. May substitute for generic if available.   oxyCODONE  (OXY IR/ROXICODONE ) 5 MG immediate release tablet    Sig: Take 1 tablet (5 mg total) by mouth daily as needed for severe pain (pain score 7-10). Must last 30 days.    Dispense:  30 tablet    Refill:  0    Chronic Pain: STOP Act (Not applicable) Fill 1 day early if closed on refill date. Avoid benzodiazepines within 8 hours of opioids   oxyCODONE  (OXY IR/ROXICODONE ) 5 MG immediate release tablet    Sig: Take 1 tablet (5 mg total) by mouth daily as needed for severe pain (pain score 7-10). Must last 30 days.    Dispense:  30 tablet    Refill:  0    Chronic Pain: STOP Act (Not applicable) Fill 1 day early if closed on refill date. Avoid benzodiazepines within 8 hours of opioids   Orders:  Orders Placed This Encounter  Procedures   NEUROLYSIS    Please order Qutenza  patches    Standing Status:   Future    Expiration Date:   08/23/2024    Where will this procedure be performed?:   [facility]     Qutenza   03/27/24   Return in about 5 weeks (around 06/28/2024) for Qutenza  #2.    Recent Visits Date Type Provider Dept  03/27/24 Procedure visit Marcelino Nurse, MD [facility]  03/07/24 Office Visit Marcelino Nurse, MD [facility]  Showing recent visits within  past 90 days and meeting all other requirements Today's Visits Date Type Provider Dept  05/23/24 Office Visit Marcelino Nurse, MD [facility]  Showing today's visits and meeting all other requirements Future Appointments Date Type Provider Dept  07/03/24 Appointment Marcelino Nurse, MD [facility]  07/26/24 Appointment Patel, Seema K, NP [facility]  Showing future appointments within next 90 days and meeting all other requirements  I discussed the assessment and treatment plan with the patient. The patient was provided an opportunity to ask questions and all were answered. The patient agreed with the plan and demonstrated an understanding of the instructions.  Patient advised to call back or seek an in-person evaluation if the symptoms or condition worsens.  Duration of encounter: .  Total time on encounter, as per AMA guidelines included both the face-to-face and non-face-to-face time personally spent by the physician and/or other qualified health care professional(s) on the day of the encounter (includes time in activities that require the physician or other qualified health care professional and does not include time in activities normally performed by clinical staff). Physician's time may include the following activities when performed: Preparing to see the patient (e.g., pre-charting review of records, searching for previously ordered imaging, lab work, and nerve conduction tests) Review of prior analgesic pharmacotherapies. Reviewing PMP Interpreting ordered tests (e.g., lab work, imaging, nerve conduction tests) Performing post-procedure evaluations, including interpretation of diagnostic procedures Obtaining and/or reviewing separately obtained history Performing a medically appropriate examination and/or evaluation Counseling and educating the patient/family/caregiver Ordering medications, tests, or procedures Referring and communicating with  other health care professionals (when not separately reported) Documenting clinical information in the electronic or other health record Independently interpreting results (not separately reported) and communicating results to the patient/ family/caregiver Care coordination (not separately reported)  Note by: Nurse Marcelino, MD (TTS and AI technology used. I apologize for any typographical errors that were not detected and corrected.) Date: 05/23/2024; Time: 4:06 PM

## 2024-05-24 ENCOUNTER — Telehealth: Payer: Self-pay | Admitting: Student in an Organized Health Care Education/Training Program

## 2024-05-24 NOTE — Telephone Encounter (Signed)
 I called [employer], they said patient's coverage ended 04-15-24. Attempted to notify patient, message left.

## 2024-05-24 NOTE — Telephone Encounter (Signed)
 PT called stated that pharmacy needs a PA for oxycodone . TY

## 2024-05-25 ENCOUNTER — Telehealth: Payer: Self-pay

## 2024-05-25 NOTE — Telephone Encounter (Signed)
 Patient states she no longer has [employer], she now has Medicaid.  The Oxycodone  prescription is locked into a different provider, so it is not covered by a provider at our office. She states she is trying to get it worked out. She said she brought her new insurance card at her last appt, but it is not in our system.

## 2024-05-25 NOTE — Telephone Encounter (Signed)
 Telephone call from patient about her Oxycodone  Rx. She reports that her [facility] is not active. However she does have Medicaid. PA completed via CMM Key: [id number]) Will await decision from Insurance.   Emma Thomas (Key: [id number]) PA Case ID #: [id number]

## 2024-05-25 NOTE — Telephone Encounter (Signed)
 Emma Thomas (Key: [id number]) PA Case ID #: [id number]  Approved today by [employer] MCD 2017 Approved. Approved for OXYCODONE  HCL Tablet 5MG , quantity up to 30 per 30 days, under the pharmacy benefit. The drug has been approved from 05/25/2024 to 11/21/2024. Generic or biosimilar substitution may be required when available and preferred on the formulary. Please note that dispensing of non-maintenance and specialty medications may be limited to a monthly supply. Effective Date: 05/25/2024 Authorization Expiration Date: 11/21/2024 Drug oxyCODONE  HCl 5MG  tablets  [employer] [employer] Medicaid Electronic Prior Authorization Request Form  Patient notified of approval.

## 2024-07-03 ENCOUNTER — Encounter: Payer: Self-pay | Admitting: Student in an Organized Health Care Education/Training Program

## 2024-07-03 ENCOUNTER — Ambulatory Visit
Attending: Student in an Organized Health Care Education/Training Program | Admitting: Student in an Organized Health Care Education/Training Program

## 2024-07-03 VITALS — BP 122/86 | HR 93 | Temp 97.3°F | Resp 14 | Ht 66.0 in | Wt 176.4 lb

## 2024-07-03 DIAGNOSIS — E1140 Type 2 diabetes mellitus with diabetic neuropathy, unspecified: Secondary | ICD-10-CM | POA: Insufficient documentation

## 2024-07-03 DIAGNOSIS — G5791 Unspecified mononeuropathy of right lower limb: Secondary | ICD-10-CM | POA: Insufficient documentation

## 2024-07-03 DIAGNOSIS — M792 Neuralgia and neuritis, unspecified: Secondary | ICD-10-CM | POA: Diagnosis present

## 2024-07-03 MED ORDER — CAPSAICIN-CLEANSING GEL 8 % EX KIT
4.0000 | PACK | Freq: Once | CUTANEOUS | Status: AC
  Administered 2024-07-03: 4 via TOPICAL
  Filled 2024-07-03: qty 4

## 2024-07-03 NOTE — Progress Notes (Signed)
 PROVIDER NOTE: Interpretation of information contained herein should be left to medically-trained personnel. Specific patient instructions are provided elsewhere under Patient Instructions section of medical record. This document was created in part using STT-dictation technology, any transcriptional errors that may result from this process are unintentional.  Patient: Emma Thomas Type: Established DOB: 1996/04/07 MRN: [id number] PCP: [facility]  Service: Procedure DOS: 07/03/2024 Setting: Ambulatory Location: Ambulatory outpatient facility Delivery: Face-to-face Provider: Wallie Sherry, MD Specialty: Interventional Pain Management Specialty designation: 09 Location: Outpatient facility Ref. Prov.: [place]       Interventional Therapy   Type: Qutenza  Neurolysis #2  Laterality:  Bilateral Area treated: Feet Imaging Guidance: None Anesthesia/analgesia/anxiolysis/sedation: None required Medication (Right): Qutenza  (capsaicin  8%) topical system Medication (Left): Qutenza  (capsaicin  8%) topical system Date: 07/03/2024 Performed by: Wallie Sherry, MD Rationale (medical necessity): procedure needed and proper for the treatment of Emma Thomas's medical symptoms and needs. Indication: Painful diabetic peripheral neuralgia (DPN) (ICD-10-CM:E11.40) severe enough to impact quality of life or function. 1. Chronic painful diabetic neuropathy (HCC)   2. Neuropathic pain of foot, left   3. Neuropathy of foot, right     NAS-11 Pain score:   Pre-procedure: 4 /10   Post-procedure: 4 /10     Position / Prep / Materials:  Position: Supine  Materials: Qutenza  Kit (4 patches)  H&P (Pre-op Assessment):  Emma Thomas is a 28 y.o. (year old), female patient, seen today for interventional treatment. She  has a past surgical history that includes abscess removal; Nexplanon (01/2011); DIALYSIS/PERMA CATHETER INSERTION (N/A, 04/24/2022); DIALYSIS/PERMA CATHETER REMOVAL  (N/A, 06/02/2022); Insertion of dialysis catheter (Right); CAPD insertion (N/A, 06/09/2022); Umbilical hernia repair (N/A, 06/09/2022); CAPD revision (N/A, 07/16/2022); Amputation (Left, 11/23/2022); CAPD revision (N/A, 11/24/2022); IR Fluoro Guide CV Line Right (11/27/2022); IR US  Guide Vasc Access Right (11/27/2022); CAPD insertion (N/A, 12/15/2022); CAPD removal (N/A, 12/15/2022); DIALYSIS/PERMA CATHETER REMOVAL (N/A, 12/28/2022); CAPD removal (N/A, 01/08/2023); Irrigation and debridement foot (Left, 06/19/2023); Irrigation and debridement foot (Left, 07/09/2023); and Amputation (Left, 07/09/2023). Emma Thomas has a current medication list which includes the following prescription(s): acetaminophen , amlodipine , atorvastatin , bumetanide , calcitriol , carvedilol , citalopram , humalog , montelukast , norethindrone -ethinyl estradiol , oxycodone , pregabalin , promethazine , sevelamer  carbonate, and sumatriptan . Her primarily concern today is the Foot Pain (bilateral)  Initial Vital Signs:  Pulse/HCG Rate: 93  Temp: (!) 97.3 F (36.3 C) Resp: 14 BP: 122/86 SpO2: 100 %  BMI: Estimated body mass index is 28.47 kg/m as calculated from the following:   Height as of this encounter: 5' 6 (1.676 m).   Weight as of this encounter: 176 lb 5.9 oz (80 kg).  Risk Assessment: Allergies: Reviewed. She is allergic to losartan .  Allergy Precautions: None required Coagulopathies: Reviewed. None identified.  Blood-thinner therapy: None at this time Active Infection(s): Reviewed. None identified. Emma Thomas is afebrile  Site Confirmation: Emma Thomas was asked to confirm the procedure and laterality before marking the site Procedure checklist: Completed Consent: Before the procedure and under the influence of no sedative(s), amnesic(s), or anxiolytics, the patient was informed of the treatment options, risks and possible complications. To fulfill our ethical and legal obligations, as recommended by the American Medical Association's Code  of Ethics, I have informed the patient of my clinical impression; the nature and purpose of the treatment or procedure; the risks, benefits, and possible complications of the intervention; the alternatives, including doing nothing; the risk(s) and benefit(s) of the alternative treatment(s) or procedure(s); and the risk(s) and benefit(s) of doing nothing. The patient was provided  information about the general risks and possible complications associated with the procedure. These may include, but are not limited to: failure to achieve desired goals, infection, bleeding, organ or nerve damage, allergic reactions, paralysis, and death. In addition, the patient was informed of those risks and complications associated to the procedure, such as failure to decrease pain; infection; bleeding; organ or nerve damage with subsequent damage to sensory, motor, and/or autonomic systems, resulting in permanent pain, numbness, and/or weakness of one or several areas of the body; allergic reactions; (i.e.: anaphylactic reaction); and/or death. Furthermore, the patient was informed of those risks and complications associated with the medications. These include, but are not limited to: allergic reactions (i.e.: anaphylactic or anaphylactoid reaction(s)); adrenal axis suppression; blood sugar elevation that in diabetics may result in ketoacidosis or comma; water retention that in patients with history of congestive heart failure may result in shortness of breath, pulmonary edema, and decompensation with resultant heart failure; weight gain; swelling or edema; medication-induced neural toxicity; particulate matter embolism and blood vessel occlusion with resultant organ, and/or nervous system infarction; and/or aseptic necrosis of one or more joints. Finally, the patient was informed that Medicine is not an exact science; therefore, there is also the possibility of unforeseen or unpredictable risks and/or possible complications that  may result in a catastrophic outcome. The patient indicated having understood very clearly. We have given the patient no guarantees and we have made no promises. Enough time was given to the patient to ask questions, all of which were answered to the patient's satisfaction. Emma Thomas has indicated that she wanted to continue with the procedure. Attestation: I, the ordering provider, attest that I have discussed with the patient the benefits, risks, side-effects, alternatives, likelihood of achieving goals, and potential problems during recovery for the procedure that I have provided informed consent. Date  Time: 07/03/2024 10:07 AM  Pre-Procedure Preparation:  Monitoring: As per clinic protocol. Respiration, ETCO2, SpO2, BP, heart rate and rhythm monitor placed and checked for adequate function Safety Precautions: Patient was assessed for positional comfort and pressure points before starting the procedure. Time-out: I initiated and conducted the Time-out before starting the procedure, as per protocol. The patient was asked to participate by confirming the accuracy of the Time Out information. Verification of the correct person, site, and procedure were performed and confirmed by me, the nursing staff, and the patient. Time-out conducted as per Joint Commission's Universal Protocol (UP.01.01.01). Time: 1040 Start Time: 1046 hrs.  Description/Narrative of Procedure:          Region: Distal lower extremities Target Area: Sensory peripheral nerves affected by diabetic peripheral neuropathy Site: Feet Approach: Percutaneous  No./Series: Not applicable  Type: Percutaneous  Purpose: Therapeutic  Region: Distal lower extremities  Start Time: 1046 hrs.  Description of the Procedure: Protocol guidelines were followed. The patient was assisted into a comfortable position.  Informed consent was obtained in the patient monitored in the usual manner.  All questions were answered prior to the  procedure.  They Qutenza  patches were applied to the affected area and then covered with the wrap.  The Patient was kept under observation until the treatment was completed.  The patches were removed and the treated area was inspected.  Vitals:   07/03/24 1028  BP: 122/86  Pulse: 93  Resp: 14  Temp: (!) 97.3 F (36.3 C)  TempSrc: Temporal  SpO2: 100%  Weight: 176 lb 5.9 oz (80 kg)  Height: 5' 6 (1.676 m)      End  Time: 1126 hrs.  Type of Imaging Technique: None used Indication(s): N/A Exposure Time: No patient exposure Contrast: None used. Fluoroscopic Guidance: N/A Ultrasound Guidance: N/A Interpretation: N/A  Post-operative Assessment:  Post-procedure Vital Signs:  Pulse/HCG Rate: 93  Temp: (!) 97.3 F (36.3 C) Resp: 14 BP: 122/86 SpO2: 100 %  EBL: None  Complications: No immediate post-treatment complications observed by team, or reported by patient.  Note: The patient tolerated the entire procedure well. A repeat set of vitals were taken after the procedure and the patient was kept under observation following institutional policy, for this type of procedure. Post-procedural neurological assessment was performed, showing return to baseline, prior to discharge. The patient was provided with post-procedure discharge instructions, including a section on how to identify potential problems. Should any problems arise concerning this procedure, the patient was given instructions to immediately contact us , at any time, without hesitation. In any case, we plan to contact the patient by telephone for a follow-up status report regarding this interventional procedure.  Comments:  No additional relevant information.  Plan of Care (POC)  Orders:  No orders of the defined types were placed in this encounter.  PMP was checked and reviewed, prescription for oxycodone  5 mg daily as needed  Medications ordered for procedure: Meds ordered this encounter  Medications   capsaicin   topical system 8 % patch 4 patch    Disregard order if transmitted to pharmacy. Qutenza  Kit to be used from [facility].   Medications administered: We administered capsaicin  topical system.  See the medical record for exact dosing, route, and time of administration.  Follow-up plan:   Return for Keep sch. appt.       Qutenza  03/27/24, 07/03/24    Recent Visits Date Type Provider Dept  05/23/24 Office Visit Marcelino Nurse, MD [facility]  Showing recent visits within past 90 days and meeting all other requirements Today's Visits Date Type Provider Dept  07/03/24 Procedure visit Marcelino Nurse, MD [facility]  Showing today's visits and meeting all other requirements Future Appointments Date Type Provider Dept  07/26/24 Appointment Patel, Seema K, NP [facility]  Showing future appointments within next 90 days and meeting all other requirements  Disposition: Discharge home  Discharge (Date  Time): 07/03/2024; 1135 hrs.   Primary Care Physician: [facility] Location: [facility] Note by: Nurse Marcelino, MD (TTS technology used. I apologize for any typographical errors that were not detected and corrected.) Date: 07/03/2024; Time: 12:02 PM  Disclaimer:  Medicine is not an Visual merchandiser. The only guarantee in medicine is that nothing is guaranteed. It is important to note that the decision to proceed with this intervention was based on the information collected from the patient. The Data and conclusions were drawn from the patient's questionnaire, the interview, and the physical examination. Because the information was provided in large part by the patient, it cannot be guaranteed that it has not been purposely or unconsciously manipulated. Every effort has been made to obtain as much relevant data as possible for this evaluation. It is important to note that the conclusions that lead to this procedure  are derived in large part from the available data. Always take into account that the treatment will also be dependent on availability of resources and existing treatment guidelines, considered by other Pain Management Practitioners as being common knowledge and practice, at the time of the intervention. For Medico-Legal purposes, it is also important to point  out that variation in procedural techniques and pharmacological choices are the acceptable norm. The indications, contraindications, technique, and results of the above procedure should only be interpreted and judged by a Board-Certified Interventional Pain Specialist with extensive familiarity and expertise in the same exact procedure and technique.

## 2024-07-04 ENCOUNTER — Telehealth: Payer: Self-pay

## 2024-07-04 NOTE — Telephone Encounter (Signed)
 Post procedure follow up. Patient states she is doing fine.

## 2024-07-26 ENCOUNTER — Encounter: Payer: Self-pay | Admitting: Nurse Practitioner

## 2024-07-26 ENCOUNTER — Ambulatory Visit: Attending: Nurse Practitioner | Admitting: Nurse Practitioner

## 2024-07-26 VITALS — BP 146/103 | HR 101 | Temp 97.3°F | Resp 18 | Ht 66.0 in | Wt 165.0 lb

## 2024-07-26 DIAGNOSIS — G5791 Unspecified mononeuropathy of right lower limb: Secondary | ICD-10-CM | POA: Diagnosis not present

## 2024-07-26 DIAGNOSIS — Z794 Long term (current) use of insulin: Secondary | ICD-10-CM

## 2024-07-26 DIAGNOSIS — M25562 Pain in left knee: Secondary | ICD-10-CM | POA: Diagnosis not present

## 2024-07-26 DIAGNOSIS — M792 Neuralgia and neuritis, unspecified: Secondary | ICD-10-CM | POA: Insufficient documentation

## 2024-07-26 DIAGNOSIS — G8929 Other chronic pain: Secondary | ICD-10-CM | POA: Diagnosis present

## 2024-07-26 DIAGNOSIS — E1140 Type 2 diabetes mellitus with diabetic neuropathy, unspecified: Secondary | ICD-10-CM | POA: Diagnosis not present

## 2024-07-26 DIAGNOSIS — Z79899 Other long term (current) drug therapy: Secondary | ICD-10-CM | POA: Insufficient documentation

## 2024-07-26 DIAGNOSIS — G894 Chronic pain syndrome: Secondary | ICD-10-CM | POA: Insufficient documentation

## 2024-07-26 MED ORDER — OXYCODONE HCL 5 MG PO TABS: 5.0000 mg | ORAL_TABLET | Freq: Every day | ORAL | 0 refills | Status: DC

## 2024-07-26 MED ORDER — PREGABALIN 50 MG PO CAPS: 50.0000 mg | ORAL_CAPSULE | Freq: Three times a day (TID) | ORAL | 2 refills | Status: DC

## 2024-07-26 NOTE — Progress Notes (Signed)
 Nursing Pain Medication Assessment:  Safety precautions to be maintained throughout the outpatient stay will include: orient to surroundings, keep bed in low position, maintain call bell within reach at all times, provide assistance with transfer out of bed and ambulation.  Medication Inspection Compliance: Pill count conducted under aseptic conditions, in front of the patient. Neither the pills nor the bottle was removed from the patient's sight at any time. Once count was completed pills were immediately returned to the patient in their original bottle.  Medication: Oxycodone  IR Pill/Patch Count: 0 of 30 pills/patches remain Pill/Patch Appearance: Markings consistent with prescribed medication Bottle Appearance: Standard pharmacy container. Clearly labeled. Filled Date: 08 / 11 / 2025 Last Medication intake:  Ran out of medicine more than 48 hours ago

## 2024-07-26 NOTE — Progress Notes (Signed)
 PROVIDER NOTE: Interpretation of information contained herein should be left to medically-trained personnel. Specific patient instructions are provided elsewhere under Patient Instructions section of medical record. This document was created in part using AI and STT-dictation technology, any transcriptional errors that may result from this process are unintentional.  Patient: Emma Thomas  Service: E/M   PCP: [facility]  DOB: [birth year]  DOS: 07/26/2024  Provider: Emmy MARLA Blanch, NP  MRN: [id number]  Delivery: Face-to-face  Specialty: Interventional Pain Management  Type: Established Patient  Setting: Ambulatory outpatient facility  Specialty designation: 09  Referring Prov.: [place]  Location: Outpatient office facility       History of present illness (HPI) [name], a 28 y.o. year old female, is here today because of her Medication management [Z79.899]. [name] primary complain today is Foot Pain  Pain Assessment: Severity of Chronic pain is reported as a 0-No pain/10. Location: Foot Right, Left/Denies. Onset: More than a month ago. Quality: Burning, Numbness, Tingling. Timing: Intermittent. Modifying factor(s): Medication. Vitals:  height is 5' 6 (1.676 m) and weight is 165 lb (74.8 kg). Her temporal temperature is 97.3 F (36.3 C) (abnormal). Her blood pressure is 146/103 (abnormal) and her pulse is 101 (abnormal). Her respiration is 18 and oxygen saturation is 100%.  BMI: Estimated body mass index is 26.63 kg/m as calculated from the following:   Height as of this encounter: 5' 6 (1.676 m).   Weight as of this encounter: 165 lb (74.8 kg).  Last encounter: 05/23/2024 Last procedure: 07/03/2024  Reason for encounter: both, medication management and post-procedure evaluation and assessment. No change in medical history since last visit.  Patient's pain is at baseline.  Patient continues multimodal pain regimen as prescribed.  States that it  provides pain relief and improvement in functional status.   The patient requested to resume Lyrica  3 times daily as she reports that it provides better pain control.  We discussed an alternative dosing regimen of taking 2 doses at night and 1 during the daytime to help minimize daytime drowsiness while still maintaining effectiveness.  [name] received a Qutenza  treatment on July 03, 2024. She report 100 % improvement in her numbness and tingling with ongoing good relief for about first week and half since the procedure; however pain returned to baseline.   Procedure Type: Qutenza  Neurolysis #2  Laterality:  Bilateral Area treated: Feet Imaging Guidance: None Anesthesia/analgesia/anxiolysis/sedation: None required Medication (Right): Qutenza  (capsaicin  8%) topical system Medication (Left): Qutenza  (capsaicin  8%) topical system Date: 07/03/2024 Performed by: Wallie Sherry, MD Rationale (medical necessity): procedure needed and proper for the treatment of [name]'s medical symptoms and needs. Indication: Painful diabetic peripheral neuralgia (DPN) (ICD-10-CM:E11.40) severe enough to impact quality of life or function. 1. Chronic painful diabetic neuropathy (HCC)   2. Neuropathic pain of foot, left   3. Neuropathy of foot, right       NAS-11 Pain score:        Pre-procedure: 4 /10        Post-procedure: 4 /10   Post-Procedure Evaluation    Effectiveness:  Initial hour after procedure: 100 % . Subsequent 4-6 hours post-procedure: 100 % . Analgesia past initial 6 hours: 30 % (Done well the first week, noticed no benefit after week and a half.  Pain came back, started in toes 6/10 now 8/10 pain most days.) . Ongoing improvement:  Analgesic:  [name] received a Qutenza  treatment on July 03, 2024. She report  100 % improvement in her numbness and tingling with ongoing good relief for about first week and half since the procedure; however pain returned to baseline.  Function:  Somewhat improved  Pharmacotherapy Assessment   Oxycodone  (oxy IR/Roxicodone ) 5 mg immediate release daily for pain. Pregabalin  (Lyrica ) 50 mg capsule 3 times daily for neuropathic pain. Monitoring: [place] PMP: PDMP reviewed during this encounter.       Pharmacotherapy: No side-effects or adverse reactions reported. Compliance: No problems identified. Effectiveness: Clinically acceptable.  Erlene Emma Thomas [place], [place]  07/26/2024  1:49 PM  Sign when Signing Visit Nursing Pain Medication Assessment:  Safety precautions to be maintained throughout the outpatient stay will include: orient to surroundings, keep bed in low position, maintain call bell within reach at all times, provide assistance with transfer out of bed and ambulation.  Medication Inspection Compliance: Pill count conducted under aseptic conditions, in front of the patient. Neither the pills nor the bottle was removed from the patient's sight at any time. Once count was completed pills were immediately returned to the patient in their original bottle.  Medication: Oxycodone  IR Pill/Patch Count: 0 of 30 pills/patches remain Pill/Patch Appearance: Markings consistent with prescribed medication Bottle Appearance: Standard pharmacy container. Clearly labeled. Filled Date: 08 / 11 / 2025 Last Medication intake:  Ran out of medicine more than 48 hours ago    UDS:  Summary  Date Value Ref Range Status  03/07/2024 FINAL  Final    Comment:    ==================================================================== Compliance Drug Analysis, Ur ==================================================================== Specimen Alert Not Detected result may be consistent with the time of last use noted for this medication. AS NEEDED. (Codeine ) ==================================================================== Test                             Result       Flag       Units  Drug Present and Declared for Prescription Verification   Acetaminophen                    PRESENT      EXPECTED  Drug Present not Declared for Prescription Verification   Alpha-hydroxymidazolam         98           UNEXPECTED ng/mg creat    Alpha-hydroxymidazolam is an expected metabolite of midazolam .    Source of midazolam  is a scheduled prescription medication.    Noroxycodone                   233          UNEXPECTED ng/mg creat    Noroxycodone is an expected metabolite of oxycodone . Sources of    oxycodone  include scheduled prescription medications.    Diphenhydramine                 PRESENT      UNEXPECTED  Drug Absent but Declared for Prescription Verification   Codeine                         Not Detected UNEXPECTED ng/mg creat   Pregabalin                      Not Detected UNEXPECTED   Citalopram                      Not Detected UNEXPECTED   Promethazine   Not Detected UNEXPECTED   Guaifenesin                     Not Detected UNEXPECTED ==================================================================== Test                      Result    Flag   Units      Ref Range   Creatinine              63               mg/dL      >=79 ==================================================================== Declared Medications:  The flagging and interpretation on this report are based on the  following declared medications.  Unexpected results may arise from  inaccuracies in the declared medications.   **Note: The testing scope of this panel includes these medications:   Citalopram  (Celexa )  Codeine   Guaifenesin   Pregabalin  (Lyrica )  Promethazine  (Phenergan )   **Note: The testing scope of this panel does not include small to  moderate amounts of these reported medications:   Acetaminophen  (Tylenol )   **Note: The testing scope of this panel does not include the  following reported medications:   Albuterol  (Ventolin  HFA)  Amlodipine  (Norvasc )  Atorvastatin  (Lipitor )  Bumetanide  (Bumex )  Calcitriol  (Rocaltrol )  Carvedilol  (Coreg )   Cephalexin  (Keflex )  Ethinyl Estradiol   Insulin  (Humalog )  Montelukast  (Singulair )  Norethindrone   Sevelamer  (Renvela )  Sumatriptan  (Imitrex ) ==================================================================== For clinical consultation, please call [phone]. ====================================================================     No results found for: CBDTHCR No results found for: D8THCCBX No results found for: D9THCCBX  ROS  Constitutional: Denies any fever or chills Gastrointestinal: No reported hemesis, hematochezia, vomiting, or acute GI distress Musculoskeletal: Denies any acute onset joint swelling, redness, loss of ROM, or weakness Neurological: No reported episodes of acute onset apraxia, aphasia, dysarthria, agnosia, amnesia, paralysis, loss of coordination, or loss of consciousness  Medication Review  SUMAtriptan , acetaminophen , amLODipine , atorvastatin , bumetanide , calcitRIOL , carvedilol , citalopram , insulin  lispro, montelukast , norethindrone -ethinyl estradiol , oxyCODONE , pregabalin , promethazine , and sevelamer  carbonate  History Review  Allergy: Ms. Farewell is allergic to losartan . Drug: Ms. Grasse  reports no history of drug use. Alcohol:  reports that she does not currently use alcohol after a past usage of about 2.0 standard drinks of alcohol per week. Tobacco:  reports that she has never smoked. She has been exposed to tobacco smoke. She has never used smokeless tobacco. Social: Ms. Willmott  reports that she has never smoked. She has been exposed to tobacco smoke. She has never used smokeless tobacco. She reports that she does not currently use alcohol after a past usage of about 2.0 standard drinks of alcohol per week. She reports that she does not use drugs. Medical:  has a past medical history of Anemia, Diabetes mellitus without complication (HCC), Essential hypertension, Headache, Hypertension (03/04/2013), Neurologic disorder, Neuromuscular disorder  (HCC), Recurrent UTI, and Renal disorder. Surgical: Ms. 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 Family: family history includes Breast cancer (age of  onset: 34) in her mother; Diabetes type II in her paternal grandmother; Lung cancer in her maternal grandmother.  Laboratory Chemistry Profile   Renal Lab Results  Component Value Date   BUN 44 (H) 03/05/2024   CREATININE 8.05 (H) 03/05/2024   LABCREA 58 02/13/2020   BCR 15 10/25/2019   GFRAA 55 (L) 02/13/2020   GFRNONAA 6 (L) 03/05/2024    Hepatic Lab Results  Component Value Date   AST 27 10/10/2023   ALT 18 10/10/2023   ALBUMIN 3.3 (L) 10/10/2023   ALKPHOS 105 10/10/2023   LIPASE 21 04/19/2022    Electrolytes Lab Results  Component Value Date   NA 131 (L) 03/05/2024   K 4.7 03/05/2024   CL 93 (L) 03/05/2024   CALCIUM  8.3 (L) 03/05/2024   MG 2.2 07/11/2023   PHOS 6.2 (H) 08/26/2023    Bone Lab Results  Component Value Date   VD25OH 20.67 (L) 06/19/2023    Inflammation (CRP: Acute Phase) (ESR: Chronic Phase) Lab Results  Component Value Date   CRP 12.9 (H) 06/18/2023   ESRSEDRATE 118 (H) 06/18/2023   LATICACIDVEN 1.2 03/06/2024         Note: Above Lab results reviewed.  Recent Imaging Review  DG Toe Great  Right CLINICAL DATA:  Pain and infection.  High fevers.  EXAM: RIGHT GREAT TOE  COMPARISON:  None Available.  FINDINGS: Posttraumatic or surgical change about the first toe distal phalanx. No radiographic evidence of osteomyelitis. No acute fracture or dislocation. Adjacent soft tissue swelling. Vascular calcifications.  IMPRESSION: Posttraumatic or surgical change about the first toe distal phalanx. Soft tissue swelling about the first toe without radiographic evidence of osteomyelitis.  Electronically Signed   By: Norman Gatlin M.D.   On: 03/06/2024 01:00 Note: Reviewed        Physical Exam  Vitals: BP (!) 146/103   Pulse (!) 101   Temp (!) 97.3 F (36.3 C) (Temporal)   Resp 18   Ht 5' 6 (1.676 m)   Wt 165 lb (74.8 kg)   SpO2 100%   BMI 26.63 kg/m  BMI: Estimated body mass index is 26.63 kg/m as calculated from the following:   Height as of this encounter: 5' 6 (1.676 m).   Weight as of this encounter: 165 lb (74.8 kg). Ideal: Ideal body weight: 59.3 kg (130 lb 11.7 oz) Adjusted ideal body weight: 65.5 kg (144 lb 7 oz) General appearance: Well nourished, well developed, and well hydrated. In no apparent acute distress Mental status: Alert, oriented x 3 (person, place, & time)       Respiratory: No evidence of acute respiratory distress Eyes: PERLA   Assessment   Diagnosis Status  1. Medication management   2. Chronic painful diabetic neuropathy (HCC)   3. Neuropathic pain of foot, left   4. Neuropathy of foot, right   5. Chronic pain of left knee   6. Chronic pain syndrome    Controlled Controlled Controlled   Updated Problems: No problems updated.  Plan of Care  Problem-specific:  Assessment and Plan  Started on Pregabalin  (Lyrica ) 50 mg capsule 3 times daily for neuropathic pain. We will continue on current medication regimen.  Prescription drug monitoring (PDMP) reviewed; findings consistent with the use of prescribed medication no evidence  of narcotic misuse or abuse.  Urine drug screen (UDS) up-to-date.  No other new issues or problems reported at this visit.  Schedule follow-up in 30 days for medication management.  Ms. NIKHITA MENTZEL  has a current medication list which includes the following long-term medication(s): amlodipine , atorvastatin , carvedilol , citalopram , humalog , montelukast , norethindrone -ethinyl estradiol , promethazine , and pregabalin .  Pharmacotherapy (Medications Ordered): Meds ordered this encounter  Medications   oxyCODONE  (OXY IR/ROXICODONE ) 5 MG immediate release tablet    Sig: Take 1 tablet (5 mg total) by mouth daily. Must last 30 days.    Dispense:  30 tablet    Refill:  0    Chronic Pain: STOP Act (Not applicable) Fill 1 day early if closed on refill date. Avoid benzodiazepines within 8 hours of opioids   pregabalin  (LYRICA ) 50 MG capsule    Sig: Take 1 capsule (50 mg total) by mouth 3 (three) times daily.    Dispense:  90 capsule    Refill:  2    Fill one day early if pharmacy is closed on scheduled refill date. May substitute for generic if available.   Orders:  No orders of the defined types were placed in this encounter.       Return in about 1 month (around 08/25/2024) for (F2F), (MM), Emmy Blanch NP.    Recent Visits Date Type Provider Dept  07/03/24 Procedure visit Marcelino Nurse, MD [facility]  05/23/24 Office Visit Marcelino Nurse, MD [facility]  Showing recent visits within past 90 days and meeting all other requirements Today's Visits Date Type Provider Dept  07/26/24 Office Visit Tamryn Popko K, NP [facility]  Showing today's visits and meeting all other requirements Future Appointments No visits were found meeting these conditions. Showing future appointments within next 90 days and meeting all other requirements  I discussed the assessment and treatment plan with the patient. The patient was provided an opportunity to ask questions and all  were answered. The patient agreed with the plan and demonstrated an understanding of the instructions.  Patient advised to call back or seek an in-person evaluation if the symptoms or condition worsens.  Duration of encounter: 30 minutes.  Total time on encounter, as per AMA guidelines included both the face-to-face and non-face-to-face time personally spent by the physician and/or other qualified health care professional(s) on the day of the encounter (includes time in activities that require the physician or other qualified health care professional and does not include time in activities normally performed by clinical staff). Physician's time may include the following activities when performed: Preparing to see the patient (e.g., pre-charting review of records, searching for previously ordered imaging, lab work, and nerve conduction tests) Review of prior analgesic pharmacotherapies. Reviewing PMP Interpreting ordered tests (e.g., lab work, imaging, nerve conduction tests) Performing post-procedure evaluations, including interpretation of diagnostic procedures Obtaining and/or reviewing separately obtained history Performing a medically appropriate examination and/or evaluation Counseling and educating the patient/family/caregiver Ordering medications, tests, or procedures Referring and communicating with other health care professionals (when not separately reported) Documenting clinical information in the electronic or other health record Independently interpreting results (not separately reported) and communicating results to the patient/ family/caregiver Care coordination (not separately reported)  Note by: Marta Bouie K Kamaree Berkel, NP (TTS and AI technology used. I apologize for any typographical errors that were not detected and corrected.) Date: 07/26/2024; Time: 2:11 PM

## 2024-07-27 ENCOUNTER — Other Ambulatory Visit: Payer: Self-pay | Admitting: *Deleted

## 2024-07-27 ENCOUNTER — Telehealth: Payer: Self-pay | Admitting: Student in an Organized Health Care Education/Training Program

## 2024-07-27 MED ORDER — OXYCODONE HCL 5 MG PO TABS: 5.0000 mg | ORAL_TABLET | Freq: Every day | ORAL | 0 refills | Status: DC

## 2024-07-27 NOTE — Telephone Encounter (Signed)
 Medication sent in by [facility], patient aware.

## 2024-07-27 NOTE — Telephone Encounter (Signed)
 Patient is unable to get medications filled because insurance will not cover 2 physicians writing scripts. States it has to come from Dr Marcelino. Please advise patient

## 2024-08-22 ENCOUNTER — Encounter: Admitting: Nurse Practitioner

## 2024-08-23 ENCOUNTER — Ambulatory Visit: Attending: Nurse Practitioner | Admitting: Nurse Practitioner

## 2024-08-23 ENCOUNTER — Encounter: Payer: Self-pay | Admitting: Nurse Practitioner

## 2024-08-23 VITALS — BP 118/77 | HR 85 | Temp 97.3°F | Resp 18 | Ht 66.0 in | Wt 172.0 lb

## 2024-08-23 DIAGNOSIS — Z79899 Other long term (current) drug therapy: Secondary | ICD-10-CM | POA: Diagnosis not present

## 2024-08-23 DIAGNOSIS — G894 Chronic pain syndrome: Secondary | ICD-10-CM | POA: Insufficient documentation

## 2024-08-23 DIAGNOSIS — M792 Neuralgia and neuritis, unspecified: Secondary | ICD-10-CM | POA: Insufficient documentation

## 2024-08-23 DIAGNOSIS — G5791 Unspecified mononeuropathy of right lower limb: Secondary | ICD-10-CM | POA: Diagnosis not present

## 2024-08-23 DIAGNOSIS — Z794 Long term (current) use of insulin: Secondary | ICD-10-CM

## 2024-08-23 DIAGNOSIS — E1140 Type 2 diabetes mellitus with diabetic neuropathy, unspecified: Secondary | ICD-10-CM | POA: Diagnosis not present

## 2024-08-23 MED ORDER — OXYCODONE HCL 5 MG PO TABS: 5.0000 mg | ORAL_TABLET | Freq: Two times a day (BID) | ORAL | 0 refills | Status: DC | PRN

## 2024-08-23 NOTE — Progress Notes (Signed)
 PROVIDER NOTE: Interpretation of information contained herein should be left to medically-trained personnel. Specific patient instructions are provided elsewhere under Patient Instructions section of medical record. This document was created in part using AI and STT-dictation technology, any transcriptional errors that may result from this process are unintentional.  Patient: Emma Thomas  Service: E/M   PCP: [facility]  DOB: [birth year]  DOS: 08/23/2024  Provider: Emmy MARLA Blanch, NP  MRN: [id number]  Delivery: Face-to-face  Specialty: Interventional Pain Management  Type: Established Patient  Setting: Ambulatory outpatient facility  Specialty designation: 09  Referring Prov.: [place]  Location: Outpatient office facility       History of present illness (HPI) [name], a 28 y.o. 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 [name] primary complain today is Foot Pain and Hand Pain  Pertinent problems: 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  Pain Assessment: Severity of Chronic pain is reported as a 8 /10. Location: Foot Right, Left/Denies. Onset: More than a month ago. Quality: Cramping, Burning, Numbness, Tingling. Timing: Intermittent. Modifying factor(s): Medication. Vitals:  height is 5' 6 (1.676 m) and weight is 171 lb 15.3 oz (78 kg). Her temporal temperature is 97.3 F (36.3 C) (abnormal). Her blood pressure is 118/77 and her pulse is 85. Her respiration is 18 and oxygen saturation is 100%.  BMI: Estimated body mass index is 27.75 kg/m as  calculated from the following:   Height as of this encounter: 5' 6 (1.676 m).   Weight as of this encounter: 171 lb 15.3 oz (78 kg).  Last encounter: 07/26/2024. Last procedure: 07/03/2024  Reason for encounter: medication management. No change in medical history since last visit.  Patient's pain is at baseline.  Patient continues multimodal pain regimen as prescribed.  States that it provides pain relief and improvement in functional status.  The patient continues experiencing bilateral foot pain due to severe diabetic neuropathy.  She has been receiving Qutenza  treatments every 3 months, which have provided significant pain relief and improvement in numbness and tingling.  Currently, she reports reoccurrence of numbness and pain in her feet and request repeat Qutenza  treatment.  However, I explained that it is still too early, and she needs to wait 1 more month before the next Qutenza  session can be performed. Pharmacotherapy Assessment   Oxycodone  (oxy IR/Roxicodone ) 5 mg immediate release every 12 hours as needed for pain Pregabalin  (Lyrica ) 50 mg capsule 3 times daily for neuropathic pain. Monitoring: [place] PMP: PDMP reviewed during this encounter.       Pharmacotherapy: No side-effects or adverse reactions reported. Compliance: No problems identified. Effectiveness: Clinically acceptable.  Erlene Doyal Thomas, [place]  08/23/2024 10:30 AM  Sign when Signing Visit Nursing Pain Medication Assessment:  Safety precautions to be maintained throughout the outpatient stay will include: orient to surroundings, keep bed in low position, maintain call bell within reach at all times, provide assistance with transfer out of bed and ambulation.  Medication Inspection Compliance: Pill count conducted under aseptic conditions, in front of the patient. Neither the pills nor the bottle was removed from the patient's sight at any time. Once count  was completed pills were immediately returned to the patient in their  original bottle.  Medication: Oxycodone  IR Pill/Patch Count: 0 of 30 pills/patches remain Pill/Patch Appearance: Markings consistent with prescribed medication Bottle Appearance: Standard pharmacy container. Clearly labeled. Filled Date: 09 / 11 / 2025 Last Medication intake:  Ran out of medicine more than 48 hours ago 08/17/24    UDS:  Summary  Date Value Ref Range Status  03/07/2024 FINAL  Final    Comment:    ==================================================================== Compliance Drug Analysis, Ur ==================================================================== Specimen Alert Not Detected result may be consistent with the time of last use noted for this medication. AS NEEDED. (Codeine ) ==================================================================== Test                             Result       Flag       Units  Drug Present and Declared for Prescription Verification   Acetaminophen                   PRESENT      EXPECTED  Drug Present not Declared for Prescription Verification   Alpha-hydroxymidazolam         98           UNEXPECTED ng/mg creat    Alpha-hydroxymidazolam is an expected metabolite of midazolam .    Source of midazolam  is a scheduled prescription medication.    Noroxycodone                   233          UNEXPECTED ng/mg creat    Noroxycodone is an expected metabolite of oxycodone . Sources of    oxycodone  include scheduled prescription medications.    Diphenhydramine                 PRESENT      UNEXPECTED  Drug Absent but Declared for Prescription Verification   Codeine                         Not Detected UNEXPECTED ng/mg creat   Pregabalin                      Not Detected UNEXPECTED   Citalopram                      Not Detected UNEXPECTED   Promethazine                    Not Detected UNEXPECTED   Guaifenesin                     Not Detected UNEXPECTED ==================================================================== Test                       Result    Flag   Units      Ref Range   Creatinine              63               mg/dL      >=79 ==================================================================== Declared Medications:  The flagging and interpretation on this report are based on the  following declared medications.  Unexpected results may arise from  inaccuracies in the declared medications.   **Note: The testing scope of this panel includes these medications:   Citalopram  (Celexa )  Codeine   Guaifenesin   Pregabalin  (Lyrica )  Promethazine  (Phenergan )   **  Note: The testing scope of this panel does not include small to  moderate amounts of these reported medications:   Acetaminophen  (Tylenol )   **Note: The testing scope of this panel does not include the  following reported medications:   Albuterol  (Ventolin  HFA)  Amlodipine  (Norvasc )  Atorvastatin  (Lipitor )  Bumetanide  (Bumex )  Calcitriol  (Rocaltrol )  Carvedilol  (Coreg )  Cephalexin  (Keflex )  Ethinyl Estradiol   Insulin  (Humalog )  Montelukast  (Singulair )  Norethindrone   Sevelamer  (Renvela )  Sumatriptan  (Imitrex ) ==================================================================== For clinical consultation, please call [phone]. ====================================================================     No results found for: CBDTHCR No results found for: D8THCCBX No results found for: D9THCCBX  ROS  Constitutional: Denies any fever or chills Gastrointestinal: No reported hemesis, hematochezia, vomiting, or acute GI distress Musculoskeletal : Foot Pain and Hand Pain Neurological: No reported episodes of acute onset apraxia, aphasia, dysarthria, agnosia, amnesia, paralysis, loss of coordination, or loss of consciousness  Medication Review  SUMAtriptan , acetaminophen , amLODipine , atorvastatin , bumetanide , calcitRIOL , carvedilol , citalopram , insulin  lispro, montelukast , norethindrone -ethinyl estradiol , oxyCODONE , pregabalin , promethazine ,  and sevelamer  carbonate  History Review  Allergy: Ms. Boran is allergic to losartan . Drug: Ms. Sparger  reports no history of drug use. Alcohol:  reports that she does not currently use alcohol after a past usage of about 2.0 standard drinks of alcohol per week. Tobacco:  reports that she has never smoked. She has been exposed to tobacco smoke. She has never used smokeless tobacco. Social: Ms. Coltrain  reports that she has never smoked. She has been exposed to tobacco smoke. She has never used smokeless tobacco. She reports that she does not currently use alcohol after a past usage of about 2.0 standard drinks of alcohol per week. She reports that she does not use drugs. Medical:  has a past medical history of Anemia, Diabetes mellitus without complication (HCC), Essential hypertension, Headache, Hypertension (03/04/2013), Neurologic disorder, Neuromuscular disorder (HCC), Recurrent UTI, and Renal disorder. Surgical: Ms. 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 Family: family history includes Breast cancer (age of onset: 103) in her mother; Diabetes type II in her paternal grandmother; Lung cancer in her maternal grandmother.  Laboratory Chemistry Profile   Renal Lab Results  Component Value  Date   BUN 44 (H) 03/05/2024   CREATININE 8.05 (H) 03/05/2024   LABCREA 58 02/13/2020   BCR 15 10/25/2019   GFRAA 55 (L) 02/13/2020   GFRNONAA 6 (L) 03/05/2024    Hepatic Lab Results  Component Value Date   AST 27 10/10/2023   ALT 18 10/10/2023   ALBUMIN 3.3 (L) 10/10/2023   ALKPHOS 105 10/10/2023   LIPASE 21 04/19/2022    Electrolytes Lab Results  Component Value Date   NA 131 (L) 03/05/2024   K 4.7 03/05/2024   CL 93 (L) 03/05/2024   CALCIUM  8.3 (L) 03/05/2024   MG 2.2 07/11/2023   PHOS 6.2 (H) 08/26/2023    Bone Lab Results  Component Value Date   VD25OH 20.67 (L) 06/19/2023    Inflammation (CRP: Acute Phase) (ESR:  Chronic Phase) Lab Results  Component Value Date   CRP 12.9 (H) 06/18/2023   ESRSEDRATE 118 (H) 06/18/2023   LATICACIDVEN 1.2 03/06/2024         Note: Above Lab results reviewed.  Recent Imaging Review  DG Toe Great Right CLINICAL DATA:  Pain and infection.  High fevers.  EXAM: RIGHT GREAT TOE  COMPARISON:  None Available.  FINDINGS: Posttraumatic or surgical change about the first toe distal phalanx. No radiographic evidence of osteomyelitis. No acute fracture or dislocation. Adjacent soft tissue swelling. Vascular calcifications.  IMPRESSION: Posttraumatic or surgical change about the first toe distal phalanx. Soft tissue swelling about the first toe without radiographic evidence of osteomyelitis.  Electronically Signed   By: Norman Gatlin M.D.   On: 03/06/2024 01:00 Note: Reviewed        Physical Exam  Vitals: BP 118/77 (BP Location: Left Arm, Patient Position: Sitting, Cuff Size: Normal)   Pulse 85   Temp (!) 97.3 F (36.3 C) (Temporal)   Resp 18   Ht 5' 6 (1.676 m)   Wt 171 lb 15.3 oz (78 kg)   SpO2 100%   BMI 27.75 kg/m  BMI: Estimated body mass index is 27.75 kg/m as calculated from the following:   Height as of this encounter: 5' 6 (1.676 m).   Weight as of this encounter: 171 lb 15.3 oz (78 kg). Ideal: Ideal  body weight: 59.3 kg (130 lb 11.7 oz) Adjusted ideal body weight: 66.8 kg (147 lb 3.6 oz) General appearance: Well nourished, well developed, and well hydrated. In no apparent acute distress Mental status: Alert, oriented x 3 (person, place, & time)       Respiratory: No evidence of acute respiratory distress Eyes: PERLA  Musculoskeletal: + foot pain due to chronic painful diabetic neuropathy Assessment   Diagnosis Status  1. Chronic painful diabetic neuropathy (HCC)   2. Neuropathic pain of foot, left   3. Neuropathy of foot, right   4. Medication management   5. Chronic pain syndrome    Controlled Controlled Controlled   Updated Problems: No problems updated.  Plan of Care  Problem-specific:  Assessment and Plan Chronic painful diabetic neuropathy: The patient continues experiencing bilateral foot pain due to severe diabetic neuropathy.  She has been receiving Qutenza  treatments every 3 months, which have provided significant pain relief and improvement in numbness and tingling.  Qutenza  is a topical medication used to treat neuropathic pain, which is chronic pain caused by damage to nerves.  It contains capsaicin  8% topical patch target and treats at the source of pain.  Chronic pain syndrome: Patient's pain is well-controlled with oxycodone , will continue on current medication regimen. Prescription drug monitoring (PDMP) reviewed; findings consistent with the use of prescribed medication no evidence of narcotic misuse or abuse.  Urine drug screen (UDS) up-to-date.  Schedule follow-up in 90 days for medication management.   Plan: (Clinic): (B) Quentza treatment with Dr. Marcelino   Ms. SHANORA CHRISTENSEN has a current medication list which includes the following long-term medication(s): amlodipine , atorvastatin , carvedilol , citalopram , humalog , montelukast , norethindrone -ethinyl estradiol , pregabalin , and promethazine .  Pharmacotherapy (Medications Ordered): Meds ordered this  encounter  Medications   oxyCODONE  (OXY IR/ROXICODONE ) 5 MG immediate release tablet    Sig: Take 1 tablet (5 mg total) by mouth every 12 (twelve) hours as needed for severe pain (pain score 7-10). Must last 30 days.    Dispense:  60 tablet    Refill:  0    Chronic Pain: STOP  Act (Not applicable) Fill 1 day early if closed on refill date. Avoid benzodiazepines within 8 hours of opioids   oxyCODONE  (OXY IR/ROXICODONE ) 5 MG immediate release tablet    Sig: Take 1 tablet (5 mg total) by mouth every 12 (twelve) hours as needed for severe pain (pain score 7-10). Must last 30 days.    Dispense:  60 tablet    Refill:  0    Chronic Pain: STOP Act (Not applicable) Fill 1 day early if closed on refill date. Avoid benzodiazepines within 8 hours of opioids   oxyCODONE  (OXY IR/ROXICODONE ) 5 MG immediate release tablet    Sig: Take 1 tablet (5 mg total) by mouth every 12 (twelve) hours as needed for severe pain (pain score 7-10). Must last 30 days.    Dispense:  60 tablet    Refill:  0    Chronic Pain: STOP Act (Not applicable) Fill 1 day early if closed on refill date. Avoid benzodiazepines within 8 hours of opioids   Orders:  Orders Placed This Encounter  Procedures   NEUROLYSIS    Please order Qutenza  patches    Standing Status:   Future    Expiration Date:   11/23/2024    Where will this procedure be performed?:   [facility]        Return in about 6 weeks (around 10/04/2024) for (Clinic): (B) Quentza treatment with Dr. Marcelino .    Recent Visits Date Type Provider Dept  07/26/24 Office Visit Ngan Qualls K, NP [facility]  07/03/24 Procedure visit Marcelino Nurse, MD [facility]  Showing recent visits within past 90 days and meeting all other requirements Today's Visits Date Type Provider Dept  08/23/24 Office Visit Kelly Eisler K, NP [facility]  Showing today's visits and meeting all other requirements Future Appointments Date Type Provider Dept   11/14/24 Appointment Keyion Knack K, NP [facility]  Showing future appointments within next 90 days and meeting all other requirements  I discussed the assessment and treatment plan with the patient. The patient was provided an opportunity to ask questions and all were answered. The patient agreed with the plan and demonstrated an understanding of the instructions.  Patient advised to call back or seek an in-person evaluation if the symptoms or condition worsens.  Duration of encounter: 30 minutes.  Total time on encounter, as per AMA guidelines included both the face-to-face and non-face-to-face time personally spent by the physician and/or other qualified health care professional(s) on the day of the encounter (includes time in activities that require the physician or other qualified health care professional and does not include time in activities normally performed by clinical staff). Physician's time may include the following activities when performed: Preparing to see the patient (e.g., pre-charting review of records, searching for previously ordered imaging, lab work, and nerve conduction tests) Review of prior analgesic pharmacotherapies. Reviewing PMP Interpreting ordered tests (e.g., lab work, imaging, nerve conduction tests) Performing post-procedure evaluations, including interpretation of diagnostic procedures Obtaining and/or reviewing separately obtained history Performing a medically appropriate examination and/or evaluation Counseling and educating the patient/family/caregiver Ordering medications, tests, or procedures Referring and communicating with other health care professionals (when not separately reported) Documenting clinical information in the electronic or other health record Independently interpreting results (not separately reported) and communicating results to the patient/ family/caregiver Care coordination (not separately reported)  Note by: Brynli Ollis K  Fe Okubo, NP (TTS and AI technology used. I apologize for any typographical errors that  were not detected and corrected.) Date: 08/23/2024; Time: 12:21 PM

## 2024-08-23 NOTE — Progress Notes (Signed)
 Nursing Pain Medication Assessment:  Safety precautions to be maintained throughout the outpatient stay will include: orient to surroundings, keep bed in low position, maintain call bell within reach at all times, provide assistance with transfer out of bed and ambulation.  Medication Inspection Compliance: Pill count conducted under aseptic conditions, in front of the patient. Neither the pills nor the bottle was removed from the patient's sight at any time. Once count was completed pills were immediately returned to the patient in their original bottle.  Medication: Oxycodone  IR Pill/Patch Count: 0 of 30 pills/patches remain Pill/Patch Appearance: Markings consistent with prescribed medication Bottle Appearance: Standard pharmacy container. Clearly labeled. Filled Date: 09 / 11 / 2025 Last Medication intake:  Ran out of medicine more than 48 hours ago 08/17/24

## 2024-08-28 ENCOUNTER — Telehealth: Payer: Self-pay | Admitting: Student in an Organized Health Care Education/Training Program

## 2024-08-28 MED ORDER — OXYCODONE HCL 5 MG PO TABS: 5.0000 mg | ORAL_TABLET | Freq: Two times a day (BID) | ORAL | 0 refills | Status: AC | PRN

## 2024-08-28 MED ORDER — OXYCODONE HCL 5 MG PO TABS: 5.0000 mg | ORAL_TABLET | Freq: Two times a day (BID) | ORAL | 0 refills | Status: DC | PRN

## 2024-08-28 NOTE — Telephone Encounter (Signed)
 Please have this revised going forward. I have never heard of this. Future Rx will be coming from [facility].

## 2024-08-28 NOTE — Telephone Encounter (Signed)
 Spoke with patient, she will continue to stay on top of this with Medicaid.

## 2024-08-28 NOTE — Telephone Encounter (Signed)
 Patient's insurance will not pay for scripts written by anyone but Dr Marcelino. Please let patient know when new scripts are sent in by Dr Marcelino

## 2024-08-28 NOTE — Telephone Encounter (Signed)
 Telephone call from patient stating that Medicaid will not cover the pain medication written by anyone other than Dr. Marcelino. She states that she has spoke with the Medicaid representative, and put a ticket in but it may take some time before the change takes place to change over to [facility]. Can Dr. Marcelino resend her scripts for the time being?

## 2024-08-30 NOTE — Progress Notes (Signed)
 CC: Chief Complaint  Patient presents with  . Follow-up    Emma Thomas presents for follow up of ulcerations on both feet.  Continuing to bandage and does still have some drainage.   Objective: There is still some drainage noted on the bandaging.  Upon removal continued superficial ulcerations noted beneath the forefoot bilateral as well as the left heel and a couple of fissured ulceration these beneath the right 2nd and 3rd toes.  No signs of any ulceration.  These have significantly granulated in since her last visit.  Assessment: Encounter Diagnoses  Name Primary?  Emma Thomas Foot ulcer, left, limited to breakdown of skin (CMS/HHS-HCC) Yes  . Foot ulcer, right, limited to breakdown of skin (CMS/HHS-HCC)   . Type 1 DM with polyneuropathy (CMS/HHS-HCC)     Plan: Mild debridement of the devitalized tissue around the periphery of the ulcerative areas.  Bacitracin and gauze dressings reapplied to all of the ulcerative areas.  Continue with her current local wound care.  Patient will return to clinic in 6 weeks for follow-up.  No orders of the defined types were placed in this encounter.

## 2024-08-31 ENCOUNTER — Other Ambulatory Visit
Admission: RE | Admit: 2024-08-31 | Discharge: 2024-08-31 | Disposition: A | Discharge status: Home / Self Care | Source: Ambulatory Visit | Attending: Nephrology | Admitting: Nephrology

## 2024-08-31 LAB — HEMOGLOBIN AND HEMATOCRIT, BLOOD
HCT: 19.4 % — ABNORMAL LOW (ref 36.0–46.0)
Hemoglobin: 6.4 g/dL — ABNORMAL LOW (ref 12.0–15.0)

## 2024-09-04 ENCOUNTER — Emergency Department: Admission: EM | Admit: 2024-09-04 | Discharge: 2024-09-04 | Disposition: A | Discharge status: Home / Self Care

## 2024-09-04 ENCOUNTER — Other Ambulatory Visit: Payer: Self-pay

## 2024-09-04 DIAGNOSIS — Z992 Dependence on renal dialysis: Secondary | ICD-10-CM | POA: Insufficient documentation

## 2024-09-04 DIAGNOSIS — R7989 Other specified abnormal findings of blood chemistry: Secondary | ICD-10-CM | POA: Diagnosis present

## 2024-09-04 DIAGNOSIS — N186 End stage renal disease: Secondary | ICD-10-CM | POA: Insufficient documentation

## 2024-09-04 DIAGNOSIS — D631 Anemia in chronic kidney disease: Secondary | ICD-10-CM | POA: Insufficient documentation

## 2024-09-04 DIAGNOSIS — E1022 Type 1 diabetes mellitus with diabetic chronic kidney disease: Secondary | ICD-10-CM | POA: Insufficient documentation

## 2024-09-04 LAB — COMPREHENSIVE METABOLIC PANEL WITH GFR
ALT: 18 U/L (ref 0–44)
AST: 20 U/L (ref 15–41)
Albumin: 2.8 g/dL — ABNORMAL LOW (ref 3.5–5.0)
Alkaline Phosphatase: 61 U/L (ref 38–126)
Anion gap: 17 — ABNORMAL HIGH (ref 5–15)
BUN: 63 mg/dL — ABNORMAL HIGH (ref 6–20)
CO2: 23 mmol/L (ref 22–32)
Calcium: 7.4 mg/dL — ABNORMAL LOW (ref 8.9–10.3)
Chloride: 96 mmol/L — ABNORMAL LOW (ref 98–111)
Creatinine, Ser: 11.6 mg/dL — ABNORMAL HIGH (ref 0.44–1.00)
GFR, Estimated: 4 mL/min — ABNORMAL LOW (ref >60)
Glucose, Bld: 123 mg/dL — ABNORMAL HIGH (ref 70–99)
Potassium: 4.3 mmol/L (ref 3.5–5.1)
Sodium: 136 mmol/L (ref 135–145)
Total Bilirubin: 0.4 mg/dL (ref 0.0–1.2)
Total Protein: 7.9 g/dL (ref 6.5–8.1)

## 2024-09-04 LAB — CBC WITH DIFFERENTIAL/PLATELET
Abs Immature Granulocytes: 0.03 K/uL (ref 0.00–0.07)
Basophils Absolute: 0.1 K/uL (ref 0.0–0.1)
Basophils Relative: 1 %
Eosinophils Absolute: 0.4 K/uL (ref 0.0–0.5)
Eosinophils Relative: 4 %
HCT: 20.4 % — ABNORMAL LOW (ref 36.0–46.0)
Hemoglobin: 6.6 g/dL — ABNORMAL LOW (ref 12.0–15.0)
Immature Granulocytes: 0 %
Lymphocytes Relative: 14 %
Lymphs Abs: 1.5 K/uL (ref 0.7–4.0)
MCH: 28.9 pg (ref 26.0–34.0)
MCHC: 32.4 g/dL (ref 30.0–36.0)
MCV: 89.5 fL (ref 80.0–100.0)
Monocytes Absolute: 0.6 K/uL (ref 0.1–1.0)
Monocytes Relative: 5 %
Neutro Abs: 8.3 K/uL — ABNORMAL HIGH (ref 1.7–7.7)
Neutrophils Relative %: 76 %
Platelets: 331 K/uL (ref 150–400)
RBC: 2.28 MIL/uL — ABNORMAL LOW (ref 3.87–5.11)
RDW: 13.3 % (ref 11.5–15.5)
WBC: 10.9 K/uL — ABNORMAL HIGH (ref 4.0–10.5)
nRBC: 0 % (ref 0.0–0.2)

## 2024-09-04 LAB — PREPARE RBC (CROSSMATCH)

## 2024-09-04 LAB — HCG, QUANTITATIVE, PREGNANCY: hCG, Beta Chain, Quant, S: 1 m[IU]/mL (ref <5)

## 2024-09-04 MED ORDER — SODIUM CHLORIDE 0.9 % IV SOLN
10.0000 mL/h | Freq: Once | INTRAVENOUS | Status: AC
  Administered 2024-09-04: 10 mL/h via INTRAVENOUS

## 2024-09-04 NOTE — ED Triage Notes (Signed)
 Pt presented to ED with c/o Hgb of 6.4. Was told to come to hospital Friday morning but states I wasn't having any symptoms so I didn't want to come and wait. Denies lightheadedness, weakness. States does hemodialysis at home, last done Thursday. States is not on a set schedule and just does 3 days a week.

## 2024-09-04 NOTE — ED Provider Notes (Signed)
 [facility] Provider Note    Event Date/Time   First MD Initiated Contact with Patient 09/04/24 [phone]     (approximate)   History   Abnormal Lab   HPI  Emma Thomas is a 28 y.o. female   of end-stage renal disease on dialysis still makes urine, diabetes type 1 with insulin  pump, prior osteomyelitis of foot requiring amputation who presents to the emergency department with abnormal lab.  Patient reports that she does dialysis at home typically Monday Wednesday Friday but goes to a dialysis center to obtain blood work.  She reports that on her last blood draw which was Friday, she was told that she had a hemoglobin of 6.4 and was told to come to the ED for transfusion.  Patient states that she did not come immediately as she was asymptomatic and thought it would be busy on Friday and wanted to wait for a slower day.  She denies any chest pain lightheadedness syncopal episodes abdominal pain changes in bowel habits.  She feels comfortable following up with her primary care physician and nephrologist      Physical Exam   Triage Vital Signs: ED Triage Vitals  Encounter Vitals Group     BP 09/04/24 0256 126/84     Girls Systolic BP Percentile --      Girls Diastolic BP Percentile --      Boys Systolic BP Percentile --      Boys Diastolic BP Percentile --      Pulse Rate 09/04/24 0256 89     Resp 09/04/24 0256 18     Temp 09/04/24 0256 98.4 F (36.9 C)     Temp Source 09/04/24 0256 Oral     SpO2 09/04/24 0256 100 %     Weight 09/04/24 0257 171 lb 15.3 oz (78 kg)     Height 09/04/24 0257 5' 6 (1.676 m)     Head Circumference --      Peak Flow --      Pain Score 09/04/24 0257 6     Pain Loc --      Pain Education --      Exclude from Growth Chart --     Most recent vital signs: Vitals:   09/04/24 0700 09/04/24 0715  BP: 129/80   Pulse: 80 80  Resp:    Temp:    SpO2: 100% 100%    Nursing Triage Note reviewed. Vital signs reviewed and  patients oxygen saturation is normoxic  General: Patient is well nourished, well developed, awake and alert, resting comfortably in no acute distress Head: Normocephalic and atraumatic Eyes: Normal inspection, extraocular muscles intact, no conjunctival pallor Ear, nose, throat: Normal external exam Neck: Normal range of motion Respiratory: Patient is in no respiratory distress, lungs CTAB Cardiovascular: Patient is not tachycardic, RRR  Fistula in place with palpable thrill GI: Abd SNT with no guarding or rebound  Back: Normal inspection of the back with good strength and range of motion throughout all ext Extremities: pulses intact with good cap refills, no LE pitting edema or calf tenderness Neuro: The patient is alert and oriented to person, place, and time, appropriately conversive, with 5/5 bilat UE/LE strength, no gross motor or sensory defects noted. Coordination appears to be adequate. Skin: Warm, dry, and intact Psych: normal mood and affect, no SI or HI  ED Results / Procedures / Treatments   Labs (all labs ordered are listed, but only abnormal results are displayed) Labs Reviewed  CBC WITH DIFFERENTIAL/PLATELET - Abnormal; Notable for the following components:      Result Value   WBC 10.9 (*)    RBC 2.28 (*)    Hemoglobin 6.6 (*)    HCT 20.4 (*)    Neutro Abs 8.3 (*)    All other components within normal limits  COMPREHENSIVE METABOLIC PANEL WITH GFR - Abnormal; Notable for the following components:   Chloride 96 (*)    Glucose, Bld 123 (*)    BUN 63 (*)    Creatinine, Ser 11.60 (*)    Calcium  7.4 (*)    Albumin 2.8 (*)    GFR, Estimated 4 (*)    Anion gap 17 (*)    All other components within normal limits  HCG, QUANTITATIVE, PREGNANCY  TYPE AND SCREEN  PREPARE RBC (CROSSMATCH)     EKG None  RADIOLOGY None    PROCEDURES:  Critical Care performed: No  Procedures   MEDICATIONS ORDERED IN ED: Medications  0.9 %  sodium chloride  infusion (10  mL/hr Intravenous New Bag/Given 09/04/24 0456)     IMPRESSION / MDM / ASSESSMENT AND PLAN / ED COURSE                                Differential diagnosis includes, but is not limited to, acute on chronic anemia, electrolyte derangement, pregnancy  ED course: Patient presents for asymptomatic anemia.  She has a history of such previously that has been secondary to her renal disease.  Abdomen demonstrates no evidence of peritonitis and I do not think she needs further workup including imaging.  She had no profound electrolyte derangements today.  Patient does have an insulin  pump attached and does voice that she feels comfortable managing this while she is in the emergency department.  Her anemia was confirmed and 1 unit transfusion has been ordered.  Patient feels comfortable returning home after this has been completed   Clinical Course as of 09/04/24 0746  Mon Sep 04, 2024  0339 Hemoglobin(!): 6.6 Consistent with acute anemia.  The indications benefits risks and alternatives of transfusion were discussed with the patient who voiced understanding and opted to proceed [HD]  0742 Potassium: 4.3 No potassium elevation [HD]  0744 Creatinine(!): 11.60 C/w renal failure [HD]  0745 HCG, Beta Chain, Quant, S: 1 Not elevated [HD]    Clinical Course User Index [HD] [name], MD   At time of discharge there is no evidence of acute life, limb, vision, or fertility threat. Patient has stable vital signs, pain is well controlled, patient is ambulatory and p.o. tolerant.  Discharge instructions were completed using the EPIC system. I would refer you to those at this time. All warnings prescriptions follow-up etc. were discussed in detail with the patient. Patient indicates understanding and is agreeable with this plan. All questions answered.  Patient is made aware that they may return to the emergency department for any worsening or new condition or for any other emergency.   -- Risk:  5 This patient has a high risk of morbidity due to further diagnostic testing or treatment. Rationale: This patient's evaluation and management involve a high risk of morbidity due to the potential severity of presenting symptoms, need for diagnostic testing, and/or initiation of treatment that may require close monitoring. The differential includes conditions with potential for significant deterioration or requiring escalation of care. Treatment decisions in the ED, including medication administration, procedural interventions, or  disposition planning, reflect this level of risk. COPA: 5 The patient has the following acute or chronic illness/injury that poses a possible threat to life or bodily function: [X] : The patient has a potentially serious acute condition or an acute exacerbation of a chronic illness requiring urgent evaluation and management in the Emergency Department. The clinical presentation necessitates immediate consideration of life-threatening or function-threatening diagnoses, even if they are ultimately ruled out.   FINAL CLINICAL IMPRESSION(S) / ED DIAGNOSES   Final diagnoses:  Anemia due to chronic kidney disease, unspecified CKD stage     Rx / DC Orders   ED Discharge Orders     None        Note:  This document was prepared using Dragon voice recognition software and may include unintentional dictation errors.   [name], MD 09/04/24 [phone]

## 2024-09-04 NOTE — Discharge Instructions (Signed)

## 2024-09-05 LAB — TYPE AND SCREEN
ABO/RH(D): O POS
Antibody Screen: NEGATIVE
Unit division: 0

## 2024-09-05 LAB — BPAM RBC
Blood Product Expiration Date: 202511172359
ISSUE DATE / TIME: 202510200443
Unit Type and Rh: 5100

## 2024-10-04 ENCOUNTER — Ambulatory Visit: Admitting: Student in an Organized Health Care Education/Training Program

## 2024-11-10 ENCOUNTER — Other Ambulatory Visit: Payer: Self-pay | Admitting: Nephrology

## 2024-11-10 DIAGNOSIS — N186 End stage renal disease: Secondary | ICD-10-CM

## 2024-11-12 DIAGNOSIS — G894 Chronic pain syndrome: Secondary | ICD-10-CM | POA: Insufficient documentation

## 2024-11-12 DIAGNOSIS — Z79899 Other long term (current) drug therapy: Secondary | ICD-10-CM | POA: Insufficient documentation

## 2024-11-12 NOTE — Progress Notes (Deleted)
 PROVIDER NOTE: Interpretation of information contained herein should be left to medically-trained personnel. Specific patient instructions are provided elsewhere under Patient Instructions section of medical record. This document was created in part using AI and STT-dictation technology, any transcriptional errors that may result from this process are unintentional.  Patient: Emma Thomas  Service: E/M   PCP: [facility]  DOB: 1995-12-08  DOS: 11/14/2024  Provider: Emmy MARLA Blanch, NP  MRN: [id number]  Delivery: Face-to-face  Specialty: Interventional Pain Management  Type: Established Patient  Setting: Ambulatory outpatient facility  Specialty designation: 09  Referring Prov.: [place], [facility]  Location: Outpatient office facility       History of present illness (HPI) [name], a 28 y.o. 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 Ms. Godden primary complain today is No chief complaint on file.  Pertinent problems: Ms. Caradonna has Diabetic ketoacidosis without coma associated with type 1 diabetes mellitus (HCC); Neuropathy; Osteomyelitis of left foot (HCC); CKD (chronic kidney disease) stage V requiring chronic dialysis (HCC); Chronic painful diabetic neuropathy (HCC); Neuropathic pain of foot, left; Neuropathy of foot, right; and Chronic pain of left knee on their pertinent problem list.  Pain Assessment: Severity of   is reported as a  /10. Location:    / . Onset:  . Quality:  . Timing:  . Modifying factor(s):  SABRA Vitals:  vitals were not taken for this visit.  BMI: Estimated body mass index is 27.75 kg/m as calculated from the following:   Height as of 09/04/24: 5' 6 (1.676 m).   Weight as of 09/04/24: 171 lb 15.3 oz (78 kg).  Last encounter: 08/23/2024. Last procedure: Visit date not found.  Reason for encounter:  *** .   Discussed the use of AI scribe software for clinical note transcription  with the patient, who gave verbal consent to proceed.  History of Present Illness           Pharmacotherapy Assessment   Oxycodone  (oxy IR/Roxicodone ) 5 mg immediate release every 12 hours as needed for pain Pregabalin  (Lyrica ) 50 mg capsule 3 times daily for neuropathic pain. Monitoring: [place] PMP: PDMP reviewed during this encounter.       Pharmacotherapy: No side-effects or adverse reactions reported. Compliance: No problems identified. Effectiveness: Clinically acceptable.  [name], [place]  08/23/2024 10:30 AM  Sign when Signing Visit Nursing Pain Medication Assessment:  Safety precautions to be maintained throughout the outpatient stay will include: orient to surroundings, keep bed in low position, maintain call bell within reach at all times, provide assistance with transfer out of bed and ambulation.  Medication Inspection Compliance: Pill count conducted under aseptic conditions, in front of the patient. Neither the pills nor the bottle was removed from the patient's sight at any time. Once count was completed pills were immediately returned to the patient in their original bottle.  Medication: Oxycodone  IR Pill/Patch Count: 0 of 30 pills/patches remain Pill/Patch Appearance: Markings consistent with prescribed medication Bottle Appearance: Standard pharmacy container. Clearly labeled. Filled Date: 09 / 11 / 2025 Last Medication intake:  Ran out of medicine more than 48 hours ago 08/17/24    UDS:  Summary  Date Value Ref Range Status  03/07/2024 FINAL  Final    Comment:    ==================================================================== Compliance Drug Analysis, Ur ==================================================================== Specimen Alert Not Detected result may be consistent with the time of last use noted for this medication. AS NEEDED. (Codeine ) ====================================================================  Test                              Result       Flag       Units  Drug Present and Declared for Prescription Verification   Acetaminophen                   PRESENT      EXPECTED  Drug Present not Declared for Prescription Verification   Alpha-hydroxymidazolam         98           UNEXPECTED ng/mg creat    Alpha-hydroxymidazolam is an expected metabolite of midazolam .    Source of midazolam  is a scheduled prescription medication.    Noroxycodone                   233          UNEXPECTED ng/mg creat    Noroxycodone is an expected metabolite of oxycodone . Sources of    oxycodone  include scheduled prescription medications.    Diphenhydramine                 PRESENT      UNEXPECTED  Drug Absent but Declared for Prescription Verification   Codeine                         Not Detected UNEXPECTED ng/mg creat   Pregabalin                      Not Detected UNEXPECTED   Citalopram                      Not Detected UNEXPECTED   Promethazine                    Not Detected UNEXPECTED   Guaifenesin                     Not Detected UNEXPECTED ==================================================================== Test                      Result    Flag   Units      Ref Range   Creatinine              63               mg/dL      >=79 ==================================================================== Declared Medications:  The flagging and interpretation on this report are based on the  following declared medications.  Unexpected results may arise from  inaccuracies in the declared medications.   **Note: The testing scope of this panel includes these medications:   Citalopram  (Celexa )  Codeine   Guaifenesin   Pregabalin  (Lyrica )  Promethazine  (Phenergan )   **Note: The testing scope of this panel does not include small to  moderate amounts of these reported medications:   Acetaminophen  (Tylenol )   **Note: The testing scope of this panel does not include the  following reported medications:   Albuterol  (Ventolin  HFA)  Amlodipine   (Norvasc )  Atorvastatin  (Lipitor )  Bumetanide  (Bumex )  Calcitriol  (Rocaltrol )  Carvedilol  (Coreg )  Cephalexin  (Keflex )  Ethinyl Estradiol   Insulin  (Humalog )  Montelukast  (Singulair )  Norethindrone   Sevelamer  (Renvela )  Sumatriptan  (Imitrex ) ==================================================================== For clinical consultation, please call [phone]. ====================================================================     No results found for: CBDTHCR No results found for: D8THCCBX No results found for: D9THCCBX  ROS  Constitutional: Denies any fever or chills Gastrointestinal: No reported hemesis, hematochezia, vomiting, or acute GI distress Musculoskeletal : Foot Pain and Hand Pain Neurological: No reported episodes of acute onset apraxia, aphasia, dysarthria, agnosia, amnesia, paralysis, loss of coordination, or loss of consciousness  Medication Review  SUMAtriptan , acetaminophen , amLODipine , atorvastatin , bumetanide , calcitRIOL , carvedilol , citalopram , insulin  lispro, montelukast , norethindrone -ethinyl estradiol , oxyCODONE , pregabalin , promethazine , and sevelamer  carbonate  History Review  Allergy: Ms. Atilano is allergic to losartan . Drug: Ms. Grunden  reports no history of drug use. Alcohol:  reports that she does not currently use alcohol after a past usage of about 2.0 standard drinks of alcohol per week. Tobacco:  reports that she has never smoked. She has been exposed to tobacco smoke. She has never used smokeless tobacco. Social: Ms. Defrancesco  reports that she has never smoked. She has been exposed to tobacco smoke. She has never used smokeless tobacco. She reports that she does not currently use alcohol after a past usage of about 2.0 standard drinks of alcohol per week. She reports that she does not use drugs. Medical:  has a past medical history of Anemia, Diabetes mellitus without complication (HCC), Essential hypertension, Headache, Hypertension  (03/04/2013), Neurologic disorder, Neuromuscular disorder (HCC), Recurrent UTI, and Renal disorder. Surgical: Ms. Hritz  has a past surgical history that includes abscess removal; Nexplanon (01/2011); DIALYSIS/PERMA CATHETER INSERTION (N/A, 04/24/2022); DIALYSIS/PERMA CATHETER REMOVAL (N/A, 06/02/2022); Insertion of dialysis catheter (Right); CAPD insertion (N/A, 06/09/2022); Umbilical hernia repair (N/A, 06/09/2022); CAPD revision (N/A, 07/16/2022); Amputation (Left, 11/23/2022); CAPD revision (N/A, 11/24/2022); IR Fluoro Guide CV Line Right (11/27/2022); IR US  Guide Vasc Access Right (11/27/2022); CAPD insertion (N/A, 12/15/2022); CAPD removal (N/A, 12/15/2022); DIALYSIS/PERMA CATHETER REMOVAL (N/A, 12/28/2022); CAPD removal (N/A, 01/08/2023); Irrigation and debridement foot (Left, 06/19/2023); Irrigation and debridement foot (Left, 07/09/2023); and Amputation (Left, 07/09/2023). Family: family history includes Breast cancer (age of onset: 43) in her mother; Diabetes type II in her paternal grandmother; Lung cancer in her maternal grandmother.  Laboratory Chemistry Profile   Renal Lab Results  Component Value Date   BUN 44 (H) 03/05/2024   CREATININE 8.05 (H) 03/05/2024   LABCREA 58 02/13/2020   BCR 15 10/25/2019   GFRAA 55 (L) 02/13/2020   GFRNONAA 6 (L) 03/05/2024    Hepatic Lab Results  Component Value Date   AST 27 10/10/2023   ALT 18 10/10/2023   ALBUMIN 3.3 (L) 10/10/2023   ALKPHOS 105 10/10/2023   LIPASE 21 04/19/2022    Electrolytes Lab Results  Component Value Date   NA 131 (L) 03/05/2024   K 4.7 03/05/2024   CL 93 (L) 03/05/2024   CALCIUM  8.3 (L) 03/05/2024   MG 2.2 07/11/2023   PHOS 6.2 (H) 08/26/2023    Bone Lab Results  Component Value Date   VD25OH 20.67 (L) 06/19/2023    Inflammation (CRP: Acute Phase) (ESR: Chronic Phase) Lab Results  Component Value Date   CRP 12.9 (H) 06/18/2023   ESRSEDRATE 118 (H) 06/18/2023   LATICACIDVEN 1.2 03/06/2024         Note: Above Lab  results reviewed.  Recent Imaging Review  DG Toe Great Right CLINICAL DATA:  Pain and infection.  High fevers.  EXAM: RIGHT GREAT TOE  COMPARISON:  None Available.  FINDINGS: Posttraumatic or surgical change about the first toe distal phalanx. No radiographic evidence of osteomyelitis. No acute fracture or dislocation. Adjacent soft tissue swelling. Vascular calcifications.  IMPRESSION: Posttraumatic or surgical change about the first toe distal phalanx. Soft tissue swelling  about the first toe without radiographic evidence of osteomyelitis.  Electronically Signed   By: Norman Gatlin M.D.   On: 03/06/2024 01:00 Note: Reviewed        Physical Exam  Vitals: BP 118/77 (BP Location: Left Arm, Patient Position: Sitting, Cuff Size: Normal)   Pulse 85   Temp (!) 97.3 F (36.3 C) (Temporal)   Resp 18   Ht 5' 6 (1.676 m)   Wt 171 lb 15.3 oz (78 kg)   SpO2 100%   BMI 27.75 kg/m  BMI: Estimated body mass index is 27.75 kg/m as calculated from the following:   Height as of this encounter: 5' 6 (1.676 m).   Weight as of this encounter: 171 lb 15.3 oz (78 kg). Ideal: Ideal body weight: 59.3 kg (130 lb 11.7 oz) Adjusted ideal body weight: 66.8 kg (147 lb 3.6 oz) General appearance: Well nourished, well developed, and well hydrated. In no apparent acute distress Mental status: Alert, oriented x 3 (person, place, & time)       Respiratory: No evidence of acute respiratory distress Eyes: PERLA  Musculoskeletal: + foot pain due to chronic painful diabetic neuropathy Assessment   Diagnosis Status  1. Chronic painful diabetic neuropathy (HCC)   2. Neuropathic pain of foot, left   3. Neuropathy of foot, right   4. Medication management   5. Chronic pain syndrome    Controlled Controlled Controlled   Updated Problems: No problems updated.  Plan of Care  Problem-specific:  Assessment and Plan Chronic painful diabetic neuropathy: The patient continues experiencing  bilateral foot pain due to severe diabetic neuropathy.  She has been receiving Qutenza  treatments every 3 months, which have provided significant pain relief and improvement in numbness and tingling.  Qutenza  is a topical medication used to treat neuropathic pain, which is chronic pain caused by damage to nerves.  It contains capsaicin  8% topical patch target and treats at the source of pain.  Chronic pain syndrome: Patient's pain is well-controlled with oxycodone , will continue on current medication regimen. Prescription drug monitoring (PDMP) reviewed; findings consistent with the use of prescribed medication no evidence of narcotic misuse or abuse.  Urine drug screen (UDS) up-to-date.  Schedule follow-up in 90 days for medication management.   Plan: (Clinic): (B) Quentza treatment with Dr. Marcelino   Ms. SAORY CARRIERO has a current medication list which includes the following long-term medication(s): amlodipine , atorvastatin , carvedilol , citalopram , humalog , montelukast , norethindrone -ethinyl estradiol , pregabalin , and promethazine .  Pharmacotherapy (Medications Ordered): Meds ordered this encounter  Medications   oxyCODONE  (OXY IR/ROXICODONE ) 5 MG immediate release tablet    Sig: Take 1 tablet (5 mg total) by mouth every 12 (twelve) hours as needed for severe pain (pain score 7-10). Must last 30 days.    Dispense:  60 tablet    Refill:  0    Chronic Pain: STOP Act (Not applicable) Fill 1 day early if closed on refill date. Avoid benzodiazepines within 8 hours of opioids   oxyCODONE  (OXY IR/ROXICODONE ) 5 MG immediate release tablet    Sig: Take 1 tablet (5 mg total) by mouth every 12 (twelve) hours as needed for severe pain (pain score 7-10). Must last 30 days.    Dispense:  60 tablet    Refill:  0    Chronic Pain: STOP Act (Not applicable) Fill 1 day early if closed on refill date. Avoid benzodiazepines within 8 hours of opioids   oxyCODONE  (OXY IR/ROXICODONE ) 5 MG immediate release  tablet    Sig:  Take 1 tablet (5 mg total) by mouth every 12 (twelve) hours as needed for severe pain (pain score 7-10). Must last 30 days.    Dispense:  60 tablet    Refill:  0    Chronic Pain: STOP Act (Not applicable) Fill 1 day early if closed on refill date. Avoid benzodiazepines within 8 hours of opioids   Orders:  Orders Placed This Encounter  Procedures   NEUROLYSIS    Please order Qutenza  patches    Standing Status:   Future    Expiration Date:   11/23/2024    Where will this procedure be performed?:   [facility]    Monitoring: [place] PMP: PDMP reviewed during this encounter.       Pharmacotherapy: No side-effects or adverse reactions reported. Compliance: No problems identified. Effectiveness: Clinically acceptable.  No notes on file  UDS:  Summary  Date Value Ref Range Status  03/07/2024 FINAL  Final    Comment:    ==================================================================== Compliance Drug Analysis, Ur ==================================================================== Specimen Alert Not Detected result may be consistent with the time of last use noted for this medication. AS NEEDED. (Codeine ) ==================================================================== Test                             Result       Flag       Units  Drug Present and Declared for Prescription Verification   Acetaminophen                   PRESENT      EXPECTED  Drug Present not Declared for Prescription Verification   Alpha-hydroxymidazolam         98           UNEXPECTED ng/mg creat    Alpha-hydroxymidazolam is an expected metabolite of midazolam .    Source of midazolam  is a scheduled prescription medication.    Noroxycodone                   233          UNEXPECTED ng/mg creat    Noroxycodone is an expected metabolite of oxycodone . Sources of    oxycodone  include scheduled prescription medications.    Diphenhydramine                 PRESENT      UNEXPECTED  Drug Absent  but Declared for Prescription Verification   Codeine                         Not Detected UNEXPECTED ng/mg creat   Pregabalin                      Not Detected UNEXPECTED   Citalopram                      Not Detected UNEXPECTED   Promethazine                    Not Detected UNEXPECTED   Guaifenesin                     Not Detected UNEXPECTED ==================================================================== Test                      Result    Flag   Units      Ref Range   Creatinine  63               mg/dL      >=79 ==================================================================== Declared Medications:  The flagging and interpretation on this report are based on the  following declared medications.  Unexpected results may arise from  inaccuracies in the declared medications.   **Note: The testing scope of this panel includes these medications:   Citalopram  (Celexa )  Codeine   Guaifenesin   Pregabalin  (Lyrica )  Promethazine  (Phenergan )   **Note: The testing scope of this panel does not include small to  moderate amounts of these reported medications:   Acetaminophen  (Tylenol )   **Note: The testing scope of this panel does not include the  following reported medications:   Albuterol  (Ventolin  HFA)  Amlodipine  (Norvasc )  Atorvastatin  (Lipitor )  Bumetanide  (Bumex )  Calcitriol  (Rocaltrol )  Carvedilol  (Coreg )  Cephalexin  (Keflex )  Ethinyl Estradiol   Insulin  (Humalog )  Montelukast  (Singulair )  Norethindrone   Sevelamer  (Renvela )  Sumatriptan  (Imitrex ) ==================================================================== For clinical consultation, please call [phone]. ====================================================================     No results found for: CBDTHCR No results found for: D8THCCBX No results found for: D9THCCBX  ROS  Constitutional: Denies any fever or chills Gastrointestinal: No reported hemesis, hematochezia, vomiting, or  acute GI distress Musculoskeletal: Denies any acute onset joint swelling, redness, loss of ROM, or weakness Neurological: No reported episodes of acute onset apraxia, aphasia, dysarthria, agnosia, amnesia, paralysis, loss of coordination, or loss of consciousness  Medication Review  SUMAtriptan , amLODipine , atorvastatin , bumetanide , calcitRIOL , carvedilol , citalopram , insulin  lispro, montelukast , norethindrone -ethinyl estradiol , oxyCODONE , pregabalin , promethazine , and sevelamer  carbonate  History Review  Allergy: Ms. Whisonant is allergic to losartan . Drug: Ms. White  reports no history of drug use. Alcohol:  reports that she does not currently use alcohol after a past usage of about 2.0 standard drinks of alcohol per week. Tobacco:  reports that she has never smoked. She has been exposed to tobacco smoke. She has never used smokeless tobacco. Social: Ms. Koper  reports that she has never smoked. She has been exposed to tobacco smoke. She has never used smokeless tobacco. She reports that she does not currently use alcohol after a past usage of about 2.0 standard drinks of alcohol per week. She reports that she does not use drugs. Medical:  has a past medical history of Anemia, Diabetes mellitus without complication (HCC), Essential hypertension, Headache, Hypertension (03/04/2013), Neurologic disorder, Neuromuscular disorder (HCC), Recurrent UTI, and Renal disorder. Surgical: Ms. Simmerman  has a past surgical history that includes abscess removal; Nexplanon (01/2011); DIALYSIS/PERMA CATHETER INSERTION (N/A, 04/24/2022); DIALYSIS/PERMA CATHETER REMOVAL (N/A, 06/02/2022); Insertion of dialysis catheter (Right); CAPD insertion (N/A, 06/09/2022); Umbilical hernia repair (N/A, 06/09/2022); CAPD revision (N/A, 07/16/2022); Amputation (Left, 11/23/2022); CAPD revision (N/A, 11/24/2022); IR Fluoro Guide CV Line Right (11/27/2022); IR US  Guide Vasc Access Right (11/27/2022); CAPD insertion (N/A, 12/15/2022); CAPD removal  (N/A, 12/15/2022); DIALYSIS/PERMA CATHETER REMOVAL (N/A, 12/28/2022); CAPD removal (N/A, 01/08/2023); Irrigation and debridement foot (Left, 06/19/2023); Irrigation and debridement foot (Left, 07/09/2023); and Amputation (Left, 07/09/2023). Family: family history includes Breast cancer (age of onset: 7) in her mother; Diabetes type II in her paternal grandmother; Lung cancer in her maternal grandmother.  Laboratory Chemistry Profile   Renal Lab Results  Component Value Date   BUN 63 (H) 09/04/2024   CREATININE 11.60 (H) 09/04/2024   LABCREA 58 02/13/2020   BCR 15 10/25/2019   GFRAA 55 (L) 02/13/2020   GFRNONAA 4 (L) 09/04/2024    Hepatic Lab Results  Component Value Date   AST 20 09/04/2024   ALT  18 09/04/2024   ALBUMIN 2.8 (L) 09/04/2024   ALKPHOS 61 09/04/2024   LIPASE 21 04/19/2022    Electrolytes Lab Results  Component Value Date   NA 136 09/04/2024   K 4.3 09/04/2024   CL 96 (L) 09/04/2024   CALCIUM  7.4 (L) 09/04/2024   MG 2.2 07/11/2023   PHOS 6.2 (H) 08/26/2023    Bone Lab Results  Component Value Date   VD25OH 20.67 (L) 06/19/2023    Inflammation (CRP: Acute Phase) (ESR: Chronic Phase) Lab Results  Component Value Date   CRP 12.9 (H) 06/18/2023   ESRSEDRATE 118 (H) 06/18/2023   LATICACIDVEN 1.2 03/06/2024         Note: Above Lab results reviewed.  Recent Imaging Review  DG Toe Great Right CLINICAL DATA:  Pain and infection.  High fevers.  EXAM: RIGHT GREAT TOE  COMPARISON:  None Available.  FINDINGS: Posttraumatic or surgical change about the first toe distal phalanx. No radiographic evidence of osteomyelitis. No acute fracture or dislocation. Adjacent soft tissue swelling. Vascular calcifications.  IMPRESSION: Posttraumatic or surgical change about the first toe distal phalanx. Soft tissue swelling about the first toe without radiographic evidence of osteomyelitis.  Electronically Signed   By: Norman Gatlin M.D.   On: 03/06/2024 01:00 Note:  Reviewed        Physical Exam  Vitals: There were no vitals taken for this visit. BMI: Estimated body mass index is 27.75 kg/m as calculated from the following:   Height as of 09/04/24: 5' 6 (1.676 m).   Weight as of 09/04/24: 171 lb 15.3 oz (78 kg). Ideal: Patient weight not recorded General appearance: Well nourished, well developed, and well hydrated. In no apparent acute distress Mental status: Alert, oriented x 3 (person, place, & time)       Respiratory: No evidence of acute respiratory distress Eyes: PERLA   Assessment   Diagnosis Status  1. Chronic painful diabetic neuropathy (HCC)   2. Neuropathic pain of foot, left   3. Neuropathy of foot, right   4. Medication management   5. Chronic pain syndrome   6. 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 PATTIANN SOLANKI has a current medication list which includes the following long-term medication(s): amlodipine , atorvastatin , carvedilol , citalopram , humalog , montelukast , norethindrone -ethinyl estradiol , pregabalin , and promethazine .  Pharmacotherapy (Medications Ordered): No orders of the defined types were placed in this encounter.  Orders:  No orders of the defined types were placed in this encounter.    {There is no content from the last Plan section.}   No follow-ups on file.    Recent Visits Date Type Provider Dept  08/23/24 Office Visit Pattie Flaharty K, NP [facility]  Showing recent visits within past 90 days and meeting all other requirements Future Appointments Date Type Provider Dept  11/14/24 Appointment Roylene Heaton K, NP [facility]  Showing future appointments within next 90  days and meeting all other requirements  I discussed the assessment and treatment plan with the patient. The patient was provided an opportunity to ask questions and all were answered. The patient agreed with the plan and demonstrated an understanding of the instructions.  Patient  advised to call back or seek an in-person evaluation if the symptoms or condition worsens.  Duration of encounter: *** minutes.  Total time on encounter, as per AMA guidelines included both the face-to-face and non-face-to-face time personally spent by the physician and/or other qualified health care professional(s) on the day of the encounter (includes time in activities that require the physician or other qualified health care professional and does not include time in activities normally performed by clinical staff). Physician's time may include the following activities when performed: Preparing to see the patient (e.g., pre-charting review of records, searching for previously ordered imaging, lab work, and nerve conduction tests) Review of prior analgesic pharmacotherapies. Reviewing PMP Interpreting ordered tests (e.g., lab work, imaging, nerve conduction tests) Performing post-procedure evaluations, including interpretation of diagnostic procedures Obtaining and/or reviewing separately obtained history Performing a medically appropriate examination and/or evaluation Counseling and educating the patient/family/caregiver Ordering medications, tests, or procedures Referring and communicating with other health care professionals (when not separately reported) Documenting clinical information in the electronic or other health record Independently interpreting results (not separately reported) and communicating results to the patient/ family/caregiver Care coordination (not separately reported)  Note by: Charvis Lightner K Deni Berti, NP (TTS and AI technology used. I apologize for any typographical errors that were not detected and  corrected.) Date: 11/14/2024; Time: 1:43 PM

## 2024-11-14 ENCOUNTER — Encounter: Admitting: Nurse Practitioner

## 2024-11-14 DIAGNOSIS — G894 Chronic pain syndrome: Secondary | ICD-10-CM

## 2024-11-14 DIAGNOSIS — M792 Neuralgia and neuritis, unspecified: Secondary | ICD-10-CM

## 2024-11-14 DIAGNOSIS — Z79899 Other long term (current) drug therapy: Secondary | ICD-10-CM

## 2024-11-14 DIAGNOSIS — E1140 Type 2 diabetes mellitus with diabetic neuropathy, unspecified: Secondary | ICD-10-CM

## 2024-11-14 DIAGNOSIS — G8929 Other chronic pain: Secondary | ICD-10-CM

## 2024-11-14 DIAGNOSIS — G5791 Unspecified mononeuropathy of right lower limb: Secondary | ICD-10-CM

## 2024-11-17 NOTE — Progress Notes (Signed)
 11/20/2024-No show

## 2024-11-19 ENCOUNTER — Emergency Department: Payer: Self-pay

## 2024-11-19 ENCOUNTER — Inpatient Hospital Stay
Admission: EM | Admit: 2024-11-19 | Discharge: 2024-11-22 | DRG: 637 | Discharge status: Against Medical Advice | Payer: Self-pay | Attending: Student | Admitting: Student

## 2024-11-19 DIAGNOSIS — Z1152 Encounter for screening for COVID-19: Secondary | ICD-10-CM

## 2024-11-19 DIAGNOSIS — J111 Influenza due to unidentified influenza virus with other respiratory manifestations: Secondary | ICD-10-CM

## 2024-11-19 DIAGNOSIS — J101 Influenza due to other identified influenza virus with other respiratory manifestations: Secondary | ICD-10-CM | POA: Diagnosis present

## 2024-11-19 DIAGNOSIS — N186 End stage renal disease: Secondary | ICD-10-CM | POA: Diagnosis present

## 2024-11-19 DIAGNOSIS — Z89412 Acquired absence of left great toe: Secondary | ICD-10-CM

## 2024-11-19 DIAGNOSIS — Z801 Family history of malignant neoplasm of trachea, bronchus and lung: Secondary | ICD-10-CM

## 2024-11-19 DIAGNOSIS — Z992 Dependence on renal dialysis: Secondary | ICD-10-CM

## 2024-11-19 DIAGNOSIS — L97529 Non-pressure chronic ulcer of other part of left foot with unspecified severity: Secondary | ICD-10-CM | POA: Diagnosis present

## 2024-11-19 DIAGNOSIS — E1022 Type 1 diabetes mellitus with diabetic chronic kidney disease: Secondary | ICD-10-CM | POA: Diagnosis present

## 2024-11-19 DIAGNOSIS — E10621 Type 1 diabetes mellitus with foot ulcer: Secondary | ICD-10-CM | POA: Diagnosis present

## 2024-11-19 DIAGNOSIS — Z5329 Procedure and treatment not carried out because of patient's decision for other reasons: Secondary | ICD-10-CM | POA: Diagnosis not present

## 2024-11-19 DIAGNOSIS — G894 Chronic pain syndrome: Secondary | ICD-10-CM | POA: Diagnosis present

## 2024-11-19 DIAGNOSIS — I10 Essential (primary) hypertension: Secondary | ICD-10-CM | POA: Diagnosis present

## 2024-11-19 DIAGNOSIS — Z803 Family history of malignant neoplasm of breast: Secondary | ICD-10-CM

## 2024-11-19 DIAGNOSIS — Z87440 Personal history of urinary (tract) infections: Secondary | ICD-10-CM

## 2024-11-19 DIAGNOSIS — D631 Anemia in chronic kidney disease: Secondary | ICD-10-CM | POA: Diagnosis present

## 2024-11-19 DIAGNOSIS — Z79899 Other long term (current) drug therapy: Secondary | ICD-10-CM

## 2024-11-19 DIAGNOSIS — F331 Major depressive disorder, recurrent, moderate: Secondary | ICD-10-CM | POA: Diagnosis present

## 2024-11-19 DIAGNOSIS — E1069 Type 1 diabetes mellitus with other specified complication: Secondary | ICD-10-CM | POA: Diagnosis present

## 2024-11-19 DIAGNOSIS — E1010 Type 1 diabetes mellitus with ketoacidosis without coma: Principal | ICD-10-CM | POA: Diagnosis present

## 2024-11-19 DIAGNOSIS — Z888 Allergy status to other drugs, medicaments and biological substances status: Secondary | ICD-10-CM

## 2024-11-19 DIAGNOSIS — Z793 Long term (current) use of hormonal contraceptives: Secondary | ICD-10-CM

## 2024-11-19 DIAGNOSIS — F411 Generalized anxiety disorder: Secondary | ICD-10-CM | POA: Diagnosis present

## 2024-11-19 DIAGNOSIS — G629 Polyneuropathy, unspecified: Secondary | ICD-10-CM

## 2024-11-19 DIAGNOSIS — E871 Hypo-osmolality and hyponatremia: Secondary | ICD-10-CM | POA: Diagnosis present

## 2024-11-19 DIAGNOSIS — E1040 Type 1 diabetes mellitus with diabetic neuropathy, unspecified: Secondary | ICD-10-CM | POA: Diagnosis present

## 2024-11-19 DIAGNOSIS — Z833 Family history of diabetes mellitus: Secondary | ICD-10-CM

## 2024-11-19 DIAGNOSIS — Z794 Long term (current) use of insulin: Secondary | ICD-10-CM

## 2024-11-19 DIAGNOSIS — E782 Mixed hyperlipidemia: Secondary | ICD-10-CM | POA: Diagnosis present

## 2024-11-19 DIAGNOSIS — Z91158 Patient's noncompliance with renal dialysis for other reason: Secondary | ICD-10-CM

## 2024-11-19 DIAGNOSIS — E1065 Type 1 diabetes mellitus with hyperglycemia: Secondary | ICD-10-CM | POA: Diagnosis present

## 2024-11-19 DIAGNOSIS — I120 Hypertensive chronic kidney disease with stage 5 chronic kidney disease or end stage renal disease: Secondary | ICD-10-CM | POA: Diagnosis present

## 2024-11-19 DIAGNOSIS — N2581 Secondary hyperparathyroidism of renal origin: Secondary | ICD-10-CM | POA: Diagnosis present

## 2024-11-19 LAB — COMPREHENSIVE METABOLIC PANEL WITH GFR
ALT: 8 U/L (ref 0–44)
AST: 20 U/L (ref 15–41)
Albumin: 4.2 g/dL (ref 3.5–5.0)
Alkaline Phosphatase: 99 U/L (ref 38–126)
BUN: 81 mg/dL — ABNORMAL HIGH (ref 6–20)
CO2: <7 mmol/L — ABNORMAL LOW (ref 22–32)
Calcium: 7.6 mg/dL — ABNORMAL LOW (ref 8.9–10.3)
Chloride: 80 mmol/L — ABNORMAL LOW (ref 98–111)
Creatinine, Ser: 12.9 mg/dL — ABNORMAL HIGH (ref 0.44–1.00)
GFR, Estimated: 4 mL/min — ABNORMAL LOW
Glucose, Bld: 443 mg/dL — ABNORMAL HIGH (ref 70–99)
Potassium: 5 mmol/L (ref 3.5–5.1)
Sodium: 131 mmol/L — ABNORMAL LOW (ref 135–145)
Total Bilirubin: 0.4 mg/dL (ref 0.0–1.2)
Total Protein: 9.2 g/dL — ABNORMAL HIGH (ref 6.5–8.1)

## 2024-11-19 LAB — CBG MONITORING, ED
Glucose-Capillary: 255 mg/dL — ABNORMAL HIGH (ref 70–99)
Glucose-Capillary: 296 mg/dL — ABNORMAL HIGH (ref 70–99)
Glucose-Capillary: 365 mg/dL — ABNORMAL HIGH (ref 70–99)
Glucose-Capillary: 389 mg/dL — ABNORMAL HIGH (ref 70–99)
Glucose-Capillary: 398 mg/dL — ABNORMAL HIGH (ref 70–99)
Glucose-Capillary: 405 mg/dL — ABNORMAL HIGH (ref 70–99)
Glucose-Capillary: 409 mg/dL — ABNORMAL HIGH (ref 70–99)
Glucose-Capillary: 419 mg/dL — ABNORMAL HIGH (ref 70–99)
Glucose-Capillary: 453 mg/dL — ABNORMAL HIGH (ref 70–99)

## 2024-11-19 LAB — BASIC METABOLIC PANEL WITH GFR
BUN: 81 mg/dL — ABNORMAL HIGH (ref 6–20)
CO2: <7 mmol/L — ABNORMAL LOW (ref 22–32)
Calcium: 6.6 mg/dL — ABNORMAL LOW (ref 8.9–10.3)
Chloride: 86 mmol/L — ABNORMAL LOW (ref 98–111)
Creatinine, Ser: 12 mg/dL — ABNORMAL HIGH (ref 0.44–1.00)
GFR, Estimated: 4 mL/min — ABNORMAL LOW
Glucose, Bld: 464 mg/dL — ABNORMAL HIGH (ref 70–99)
Potassium: 4.8 mmol/L (ref 3.5–5.1)
Sodium: 135 mmol/L (ref 135–145)

## 2024-11-19 LAB — RESP PANEL BY RT-PCR (RSV, FLU A&B, COVID)  RVPGX2
Influenza A by PCR: NEGATIVE
Influenza B by PCR: POSITIVE — AB
Resp Syncytial Virus by PCR: NEGATIVE
SARS Coronavirus 2 by RT PCR: NEGATIVE

## 2024-11-19 LAB — CBC
HCT: 43.8 % (ref 36.0–46.0)
Hemoglobin: 13.5 g/dL (ref 12.0–15.0)
MCH: 27.7 pg (ref 26.0–34.0)
MCHC: 30.8 g/dL (ref 30.0–36.0)
MCV: 89.9 fL (ref 80.0–100.0)
Platelets: 347 K/uL (ref 150–400)
RBC: 4.87 MIL/uL (ref 3.87–5.11)
RDW: 13.3 % (ref 11.5–15.5)
WBC: 10.1 K/uL (ref 4.0–10.5)
nRBC: 0 % (ref 0.0–0.2)

## 2024-11-19 LAB — LACTIC ACID, PLASMA: Lactic Acid, Venous: 1.4 mmol/L (ref 0.5–1.9)

## 2024-11-19 LAB — HCG, QUANTITATIVE, PREGNANCY: hCG, Beta Chain, Quant, S: 2 m[IU]/mL

## 2024-11-19 LAB — BETA-HYDROXYBUTYRIC ACID
Beta-Hydroxybutyric Acid: >8 mmol/L — ABNORMAL HIGH (ref 0.05–0.27)
Beta-Hydroxybutyric Acid: >8 mmol/L — ABNORMAL HIGH (ref 0.05–0.27)

## 2024-11-19 LAB — LIPASE, BLOOD: Lipase: 21 U/L (ref 11–51)

## 2024-11-19 LAB — BLOOD GAS, VENOUS

## 2024-11-19 MED ORDER — MORPHINE SULFATE (PF) 4 MG/ML IV SOLN
4.0000 mg | Freq: Once | INTRAVENOUS | Status: AC
  Administered 2024-11-19: 4 mg via INTRAVENOUS
  Filled 2024-11-19: qty 1

## 2024-11-19 MED ORDER — CARVEDILOL 6.25 MG PO TABS
6.2500 mg | ORAL_TABLET | Freq: Two times a day (BID) | ORAL | Status: DC
  Administered 2024-11-20 – 2024-11-21 (×3): 6.25 mg via ORAL
  Filled 2024-11-19 (×3): qty 1

## 2024-11-19 MED ORDER — SODIUM CHLORIDE 0.9 % IV BOLUS
1000.0000 mL | Freq: Once | INTRAVENOUS | Status: AC
  Administered 2024-11-19: 1000 mL via INTRAVENOUS

## 2024-11-19 MED ORDER — ONDANSETRON HCL 4 MG/2ML IJ SOLN
4.0000 mg | Freq: Four times a day (QID) | INTRAMUSCULAR | Status: DC | PRN
  Administered 2024-11-19 – 2024-11-20 (×3): 4 mg via INTRAVENOUS
  Filled 2024-11-19 (×3): qty 2

## 2024-11-19 MED ORDER — AMLODIPINE BESYLATE 5 MG PO TABS
10.0000 mg | ORAL_TABLET | Freq: Every day | ORAL | Status: DC
  Administered 2024-11-21: 10 mg via ORAL
  Filled 2024-11-19 (×2): qty 2

## 2024-11-19 MED ORDER — INSULIN REGULAR(HUMAN) IN NACL 100-0.9 UT/100ML-% IV SOLN
INTRAVENOUS | Status: DC
  Administered 2024-11-19: 7 [IU]/h via INTRAVENOUS
  Filled 2024-11-19: qty 100

## 2024-11-19 MED ORDER — ATORVASTATIN CALCIUM 20 MG PO TABS
80.0000 mg | ORAL_TABLET | Freq: Every day | ORAL | Status: DC
  Administered 2024-11-21: 80 mg via ORAL
  Filled 2024-11-19 (×2): qty 4

## 2024-11-19 MED ORDER — LABETALOL HCL 5 MG/ML IV SOLN
10.0000 mg | INTRAVENOUS | Status: DC | PRN
  Administered 2024-11-19 – 2024-11-21 (×10): 10 mg via INTRAVENOUS
  Filled 2024-11-19 (×9): qty 4

## 2024-11-19 MED ORDER — ONDANSETRON HCL 4 MG/2ML IJ SOLN
4.0000 mg | Freq: Once | INTRAMUSCULAR | Status: AC
  Administered 2024-11-19: 4 mg via INTRAVENOUS
  Filled 2024-11-19: qty 2

## 2024-11-19 MED ORDER — HEPARIN SODIUM (PORCINE) 5000 UNIT/ML IJ SOLN
5000.0000 [IU] | Freq: Three times a day (TID) | INTRAMUSCULAR | Status: DC
  Administered 2024-11-20 – 2024-11-21 (×3): 5000 [IU] via SUBCUTANEOUS
  Filled 2024-11-19 (×4): qty 1

## 2024-11-19 MED ORDER — CITALOPRAM HYDROBROMIDE 20 MG PO TABS: 10.0000 mg | ORAL_TABLET | Freq: Every day | ORAL | Status: DC

## 2024-11-19 MED ORDER — DEXTROSE IN LACTATED RINGERS 5 % IV SOLN: INTRAVENOUS | Status: DC

## 2024-11-19 MED ORDER — OXYCODONE HCL 5 MG PO TABS
5.0000 mg | ORAL_TABLET | Freq: Two times a day (BID) | ORAL | Status: DC | PRN
  Administered 2024-11-20 – 2024-11-21 (×3): 5 mg via ORAL
  Filled 2024-11-19 (×3): qty 1

## 2024-11-19 MED ORDER — LACTATED RINGERS IV SOLN: INTRAVENOUS | Status: AC

## 2024-11-19 MED ORDER — SODIUM BICARBONATE 8.4 % IV SOLN
50.0000 meq | Freq: Once | INTRAVENOUS | Status: AC
  Administered 2024-11-19: 50 meq via INTRAVENOUS
  Filled 2024-11-19: qty 50

## 2024-11-19 MED ORDER — DEXTROSE 50 % IV SOLN: 0.0000 mL | INTRAVENOUS | Status: DC | PRN

## 2024-11-19 MED ORDER — PREGABALIN 50 MG PO CAPS
50.0000 mg | ORAL_CAPSULE | Freq: Three times a day (TID) | ORAL | Status: DC
  Administered 2024-11-20 – 2024-11-21 (×2): 50 mg via ORAL
  Filled 2024-11-19 (×4): qty 1

## 2024-11-19 MED ORDER — LACTATED RINGERS IV SOLN: INTRAVENOUS | Status: DC

## 2024-11-19 MED ORDER — LACTATED RINGERS IV BOLUS
20.0000 mL/kg | Freq: Once | INTRAVENOUS | Status: AC
  Administered 2024-11-19: 1560 mL via INTRAVENOUS

## 2024-11-19 MED ORDER — SODIUM CHLORIDE 0.9 % IV SOLN
12.5000 mg | Freq: Four times a day (QID) | INTRAVENOUS | Status: DC | PRN
  Administered 2024-11-20: 12.5 mg via INTRAVENOUS
  Filled 2024-11-19: qty 12.5

## 2024-11-19 MED ORDER — DEXTROSE IN LACTATED RINGERS 5 % IV SOLN: INTRAVENOUS | Status: AC

## 2024-11-19 MED ORDER — INSULIN REGULAR(HUMAN) IN NACL 100-0.9 UT/100ML-% IV SOLN
INTRAVENOUS | Status: DC
  Administered 2024-11-19: 7 [IU]/h via INTRAVENOUS
  Administered 2024-11-20: 1.4 [IU]/h via INTRAVENOUS
  Administered 2024-11-22: 0.8 [IU]/h via INTRAVENOUS
  Filled 2024-11-19 (×2): qty 100

## 2024-11-19 NOTE — ED Triage Notes (Signed)
 Refer to first nurse note. Pt reports N/V for 2 days. Pt reports generalized body pain. Pt has a hx of dialysis and diabetes. CBG at time of triage 398.

## 2024-11-19 NOTE — ED Provider Notes (Signed)
 "  [facility] Provider Note    Event Date/Time   First MD Initiated Contact with Patient 11/19/24 1601     (approximate)   History   Emesis   HPI  Emma Thomas is a 29 y.o. female with concern of cough congestion nausea vomiting diarrhea abdominal discomfort.  Seems symptoms ongoing for about 2 to 3 days now.  Daughter was sick with similar symptoms but she is getting much better now.  Patient states that has not been able to hold anything down, and persistently having cough.  She was given 4 mg of IM Zofran  and route by EMS but this has not helped much with her symptoms.  She does get dialysis at home last session was on Thursday, she tells me is not peritoneal dialysis but she gets it at home so she can get it whenever.  States she has been compliant with medication for her diabetes.     Physical Exam   Triage Vital Signs: ED Triage Vitals  Encounter Vitals Group     BP 11/19/24 1345 (!) 181/109     Girls Systolic BP Percentile --      Girls Diastolic BP Percentile --      Boys Systolic BP Percentile --      Boys Diastolic BP Percentile --      Pulse Rate 11/19/24 1345 99     Resp 11/19/24 1345 18     Temp 11/19/24 1345 98.3 F (36.8 C)     Temp Source 11/19/24 1345 Oral     SpO2 11/19/24 1345 100 %     Weight 11/19/24 1346 171 lb 15.3 oz (78 kg)     Height 11/19/24 1346 5' 6 (1.676 m)     Head Circumference --      Peak Flow --      Pain Score 11/19/24 1346 9     Pain Loc --      Pain Education --      Exclude from Growth Chart --     Most recent vital signs: Vitals:   11/19/24 2200 11/19/24 2230  BP: (!) 174/118 (!) 174/107  Pulse: 87 78  Resp: (!) 26 (!) 23  Temp:    SpO2: 97% 98%     General: Awake, appears uncomfortable and tearful in exam CV:  Good peripheral perfusion.  Resp:  Normal effort.  Able to speak full sentences, tachypneic Abd:  No distention.  Soft with some mild discomfort to palpation along the left upper  quadrant of the abdomen Other:     ED Results / Procedures / Treatments   Labs (all labs ordered are listed, but only abnormal results are displayed) Labs Reviewed  RESP PANEL BY RT-PCR (RSV, FLU A&B, COVID)  RVPGX2 - Abnormal; Notable for the following components:      Result Value   Influenza B by PCR POSITIVE (*)    All other components within normal limits  COMPREHENSIVE METABOLIC PANEL WITH GFR - Abnormal; Notable for the following components:   Sodium 131 (*)    Chloride 80 (*)    CO2 <7 (*)    Glucose, Bld 443 (*)    BUN 81 (*)    Creatinine, Ser 12.90 (*)    Calcium  7.6 (*)    Total Protein 9.2 (*)    GFR, Estimated 4 (*)    All other components within normal limits  BETA-HYDROXYBUTYRIC ACID - Abnormal; Notable for the following components:   Beta-Hydroxybutyric Acid >8.00 (*)  All other components within normal limits  BLOOD GAS, VENOUS - Abnormal; Notable for the following components:   pCO2, Ven 19 (*)    Bicarbonate 6.8 (*)    Acid-base deficit 19.8 (*)    All other components within normal limits  BASIC METABOLIC PANEL WITH GFR - Abnormal; Notable for the following components:   Chloride 86 (*)    CO2 <7 (*)    Glucose, Bld 464 (*)    BUN 81 (*)    Creatinine, Ser 12.00 (*)    Calcium  6.6 (*)    GFR, Estimated 4 (*)    All other components within normal limits  BETA-HYDROXYBUTYRIC ACID - Abnormal; Notable for the following components:   Beta-Hydroxybutyric Acid >8.00 (*)    All other components within normal limits  CBG MONITORING, ED - Abnormal; Notable for the following components:   Glucose-Capillary 398 (*)    All other components within normal limits  CBG MONITORING, ED - Abnormal; Notable for the following components:   Glucose-Capillary 453 (*)    All other components within normal limits  CBG MONITORING, ED - Abnormal; Notable for the following components:   Glucose-Capillary 419 (*)    All other components within normal limits  CBG  MONITORING, ED - Abnormal; Notable for the following components:   Glucose-Capillary 405 (*)    All other components within normal limits  CBG MONITORING, ED - Abnormal; Notable for the following components:   Glucose-Capillary 409 (*)    All other components within normal limits  CBG MONITORING, ED - Abnormal; Notable for the following components:   Glucose-Capillary 389 (*)    All other components within normal limits  CBG MONITORING, ED - Abnormal; Notable for the following components:   Glucose-Capillary 365 (*)    All other components within normal limits  CBG MONITORING, ED - Abnormal; Notable for the following components:   Glucose-Capillary 296 (*)    All other components within normal limits  CBG MONITORING, ED - Abnormal; Notable for the following components:   Glucose-Capillary 255 (*)    All other components within normal limits  LIPASE, BLOOD  CBC  LACTIC ACID, PLASMA  HCG, QUANTITATIVE, PREGNANCY  URINALYSIS, ROUTINE W REFLEX MICROSCOPIC  BASIC METABOLIC PANEL WITH GFR  BASIC METABOLIC PANEL WITH GFR  HIV ANTIBODY (ROUTINE TESTING W REFLEX)  BETA-HYDROXYBUTYRIC ACID  BETA-HYDROXYBUTYRIC ACID  BETA-HYDROXYBUTYRIC ACID  HEMOGLOBIN A1C  BASIC METABOLIC PANEL WITH GFR  BETA-HYDROXYBUTYRIC ACID  BASIC METABOLIC PANEL WITH GFR  BASIC METABOLIC PANEL WITH GFR  POC URINE PREG, ED     EKG     RADIOLOGY No acute findings on my independent interpretation of this chest x-ray  PROCEDURES:  Critical Care performed: Yes, see critical care procedure note(s)  .Critical Care  Performed by: Fernand Rossie HERO, MD Authorized by: Fernand Rossie HERO, MD   Critical care provider statement:    Critical care time (minutes):  30   Critical care was time spent personally by me on the following activities:  Development of treatment plan with patient or surrogate, discussions with consultants, evaluation of patient's response to treatment, examination of patient, ordering and  review of laboratory studies, ordering and review of radiographic studies, ordering and performing treatments and interventions, pulse oximetry, re-evaluation of patient's condition and review of old charts    MEDICATIONS ORDERED IN ED: Medications  heparin  injection 5,000 Units (has no administration in time range)  insulin  regular, human (MYXREDLIN ) 100 units/ 100 mL infusion (6.5 Units/hr Intravenous Rate/Dose  Change 11/19/24 2342)  lactated ringers  infusion ( Intravenous Bolus from Bag 11/19/24 1851)  dextrose  5 % in lactated ringers  infusion (0 mLs Intravenous Hold 11/19/24 1949)  dextrose  50 % solution 0-50 mL (has no administration in time range)  labetalol  (NORMODYNE ) injection 10 mg (10 mg Intravenous Given 11/19/24 2118)  atorvastatin  (LIPITOR ) tablet 80 mg (0 mg Oral Hold 11/19/24 2222)  amLODipine  (NORVASC ) tablet 10 mg (0 mg Oral Hold 11/19/24 2222)  carvedilol  (COREG ) tablet 6.25 mg (has no administration in time range)  pregabalin  (LYRICA ) capsule 50 mg (0 mg Oral Hold 11/19/24 2223)  oxyCODONE  (Oxy IR/ROXICODONE ) immediate release tablet 5 mg (has no administration in time range)  ondansetron  (ZOFRAN ) injection 4 mg (4 mg Intravenous Given 11/19/24 2224)  promethazine  (PHENERGAN ) 12.5 mg in sodium chloride  0.9 % 50 mL IVPB (has no administration in time range)  sodium chloride  0.9 % bolus 1,000 mL (0 mLs Intravenous Stopped 11/19/24 1757)  ondansetron  (ZOFRAN ) injection 4 mg (4 mg Intravenous Given 11/19/24 1643)  morphine  (PF) 4 MG/ML injection 4 mg (4 mg Intravenous Given 11/19/24 1643)  lactated ringers  bolus 1,560 mL (0 mLs Intravenous Stopped 11/19/24 1843)  sodium bicarbonate  injection 50 mEq (50 mEq Intravenous Given 11/19/24 1843)  sodium bicarbonate  injection 50 mEq (50 mEq Intravenous Given 11/19/24 2224)     IMPRESSION / MDM / ASSESSMENT AND PLAN / ED COURSE  I reviewed the triage vital signs and the nursing notes.                               Patient's presentation is most  consistent with acute presentation with potential threat to life or bodily function.  29 year old female presents today with concern of flulike symptoms in setting of daughter who was recently ill with similar symptoms.  She is hyperglycemic here, with a significantly low CO2 level, also sodium and chloride are low likely in the setting of patient's poor p.o. tolerance over the last few days but with elevated glucose, concerning for DKA.  I am adding on beta hydroxybutyrate as well as a VBG, unclear how soon urinalysis will be available given patient's dialysis status.  Will follow-up labs including a flu panel anticipate likely admission for underlying DKA.  Will continue fluid resuscitation and provide symptomatic management.   Clinical Course as of 11/19/24 2346  Sun Nov 19, 2024  1652 pH of 7.1 per respiratory therapy, still documented as pending for some reason on the VBG.  Regardless we will start DKA workup and talk with medicine for admission at this time. [SK]  1738 Spoke with Dr. Fernand, significantly elevated beta hydroxybutyrate, plan for admission to medicine service. [SK]    Clinical Course User Index [SK] Fernand Rossie HERO, MD     FINAL CLINICAL IMPRESSION(S) / ED DIAGNOSES   Final diagnoses:  Diabetic ketoacidosis without coma associated with type 1 diabetes mellitus (HCC)  Influenza  ESRD (end stage renal disease) (HCC)     Rx / DC Orders   ED Discharge Orders     None        Note:  This document was prepared using Dragon voice recognition software and may include unintentional dictation errors.   Fernand Rossie HERO, MD 11/19/24 [phone]  "

## 2024-11-19 NOTE — ED Notes (Signed)
EDP into room, at BS.  ?

## 2024-11-19 NOTE — ED Triage Notes (Signed)
 First Nurse Note:  Pt via [facility] from home. Pt c/o flu like symptoms for the past 2 days, pt is dialysis pt. Last treatment Thursday, denies missed treatments. Pt is A&Ox4 and NAD  EMS reports:  EMS gave 4mg  of Zofran  IM  180/104 BP 90 HR  394 CBG  100% on RA

## 2024-11-19 NOTE — H&P (Signed)
 " History and Physical    Emma Thomas FMW:[id number] DOB: 11-12-1996 DOA: 11/19/2024  DOS: the patient was seen and examined on 11/19/2024  PCP: [facility]   Patient coming from: Home  I have personally briefly reviewed patient's old medical records in [facility] and CareEverywhere  HPI:   Emma Thomas is a 29 y.o. year old female with medical history of hypertension, hyperlipidemia, type 1 diabetes with history of DKA presenting to the ED with nausea/vomiting and failure to tolerate p.o. intake.  She reports having URI symptoms for the last 3 days.  States her daughter had similar symptoms but she is improving.  Reports good glycemic control except today. She performs home HD and last HD session was Thursday. On arrival to the ED patient was noted to be HDS stable.  Lab work and imaging obtained.  CBC unremarkable.  CMP with mild hyponatremia, severe acidosis with significant hyperglycemia and ESRD changes.  VBG showed bicarb 19.  BHB greater than 8.  Lactic acid normal.  Respiratory panel pending.  Chest x-ray without any acute findings.  Patient started on insulin  drip and given need for continued care, [facility] contacted for admission.  Review of Systems: As mentioned in the history of present illness. All other systems reviewed and are negative.   Past Medical History:  Diagnosis Date   Anemia    Diabetes mellitus without complication (HCC)    Type 1 DM   Essential hypertension    Headache    Hypertension 03/04/2013   Neurologic disorder    Both feet   Neuromuscular disorder (HCC)    Recurrent UTI    Renal disorder     Past Surgical History:  Procedure Laterality Date   abscess removal     excision of bartholin cyst   AMPUTATION Left 11/23/2022   Procedure: LEFT GREAT TOE PARTIAL RAY AMPUTATION;  Surgeon: Neill Boas, DPM;  Location: [facility];  Service: Podiatry;  Laterality: Left;   AMPUTATION Left 07/09/2023   Procedure: AMPUTATION First Ray;   Surgeon: Lennie Barter, DPM;  Location: [facility];  Service: Orthopedics/Podiatry;  Laterality: Left;   CAPD INSERTION N/A 06/09/2022   Procedure: LAPAROSCOPIC INSERTION CONTINUOUS AMBULATORY PERITONEAL DIALYSIS  (CAPD) CATHETER, PD rep to be present;  Surgeon: Desiderio Schanz, MD;  Location: [facility];  Service: General;  Laterality: N/A;   CAPD INSERTION N/A 12/15/2022   Procedure: LAPAROSCOPIC INSERTION CONTINUOUS AMBULATORY PERITONEAL DIALYSIS  (CAPD) CATHETER;  Surgeon: Desiderio Schanz, MD;  Location: [facility];  Service: General;  Laterality: N/A;   CAPD REMOVAL N/A 12/15/2022   Procedure: LAPAROSCOPIC REMOVAL CONTINUOUS AMBULATORY PERITONEAL DIALYSIS  (CAPD) CATHETER, removal of old catheter;  Surgeon: Desiderio Schanz, MD;  Location: [facility];  Service: General;  Laterality: N/A;   CAPD REMOVAL N/A 01/08/2023   Procedure: LAPAROSCOPIC REMOVAL CONTINUOUS AMBULATORY PERITONEAL DIALYSIS  (CAPD) CATHETER;  Surgeon: Desiderio Schanz, MD;  Location: [facility];  Service: General;  Laterality: N/A;   CAPD REVISION N/A 07/16/2022   Procedure: LAPAROSCOPIC REVISION CONTINUOUS AMBULATORY PERITONEAL DIALYSIS  (CAPD) CATHETER;  Surgeon: Desiderio Schanz, MD;  Location: [facility];  Service: General;  Laterality: N/A;   CAPD REVISION N/A 11/24/2022   Procedure: LAPAROSCOPIC REVISION CONTINUOUS AMBULATORY PERITONEAL DIALYSIS  (CAPD) CATHETER;  Surgeon: Desiderio Schanz, MD;  Location: [facility];  Service: General;  Laterality: N/A;   DIALYSIS/PERMA CATHETER INSERTION N/A 04/24/2022   Procedure: DIALYSIS/PERMA CATHETER INSERTION;  Surgeon: Jama Cordella MATSU, MD;  Location: [facility];  Service:  Cardiovascular;  Laterality: N/A;   DIALYSIS/PERMA CATHETER REMOVAL N/A 06/02/2022   Procedure: DIALYSIS/PERMA CATHETER REMOVAL;  Surgeon: Jama Cordella MATSU, MD;  Location: [facility];  Service: Cardiovascular;  Laterality: N/A;   DIALYSIS/PERMA CATHETER REMOVAL N/A 12/28/2022   Procedure: DIALYSIS/PERMA CATHETER REMOVAL;   Surgeon: Marea Selinda RAMAN, MD;  Location: [facility];  Service: Cardiovascular;  Laterality: N/A;   INSERTION OF DIALYSIS CATHETER Right    ARM   IR FLUORO GUIDE CV LINE RIGHT  11/27/2022   IR US  GUIDE VASC ACCESS RIGHT  11/27/2022   IRRIGATION AND DEBRIDEMENT FOOT Left 06/19/2023   Procedure: IRRIGATION AND DEBRIDEMENT FOOT PARTIAL 1ST RAY AMPUTATION;  Surgeon: Lennie Barter, DPM;  Location: [facility];  Service: Orthopedics/Podiatry;  Laterality: Left;   IRRIGATION AND DEBRIDEMENT FOOT Left 07/09/2023   Procedure: IRRIGATION AND DEBRIDEMENT FOOT;  Surgeon: Lennie Barter, DPM;  Location: [facility];  Service: Orthopedics/Podiatry;  Laterality: Left;   Nexplanon  01/2011   UMBILICAL HERNIA REPAIR N/A 06/09/2022   Procedure: HERNIA REPAIR UMBILICAL ADULT;  Surgeon: Desiderio Schanz, MD;  Location: [facility];  Service: General;  Laterality: N/A;     Allergies[1]  Family History  Problem Relation Age of Onset   Breast cancer Mother 34   Lung cancer Maternal Grandmother    Diabetes type II Paternal Grandmother     Prior to Admission medications  Medication Sig Start Date End Date Taking? Authorizing Provider  amLODipine  (NORVASC ) 10 MG tablet Take 1 tablet (10 mg total) by mouth daily. 04/28/22   Jens Durand, MD  atorvastatin  (LIPITOR ) 80 MG tablet Take 80 mg by mouth daily.    [provider]  bumetanide  (BUMEX ) 1 MG tablet Take 1 mg by mouth 2 (two) times daily.    [provider]  calcitRIOL  (ROCALTROL ) 0.25 MCG capsule Take 0.25 mcg by mouth daily. 09/22/23   [provider]  carvedilol  (COREG ) 6.25 MG tablet Take 6.25 mg by mouth 2 (two) times daily. 10/05/23   [provider]  citalopram  (CELEXA ) 10 MG tablet Take 10 mg by mouth daily. 09/20/23   [provider]  HUMALOG  100 UNIT/ML injection Inject 0-1 mLs (0-100 Units total) into the skin daily. Uses with Insulin  Pump 04/09/23   Cherlyn Labella, MD  montelukast  (SINGULAIR ) 10 MG tablet Take 10 mg  by mouth at bedtime.    [provider]  norethindrone -ethinyl estradiol  (LOESTRIN ) 1-20 MG-MCG tablet Take 1 tablet by mouth daily.    [provider]  oxyCODONE  (OXY IR/ROXICODONE ) 5 MG immediate release tablet Take 1 tablet (5 mg total) by mouth every 12 (twelve) hours as needed for severe pain (pain score 7-10). Must last 30 days. 10/27/24 11/26/24  Marcelino Nurse, MD  pregabalin  (LYRICA ) 50 MG capsule Take 1 capsule (50 mg total) by mouth 3 (three) times daily. 07/26/24   Patel, Seema K, NP  promethazine  (PHENERGAN ) 12.5 MG tablet Take 1 tablet (12.5 mg total) by mouth every 8 (eight) hours as needed for nausea or vomiting. 10/12/23   Alexander, Natalie, DO  sevelamer  carbonate (RENVELA ) 800 MG tablet Take 800 mg by mouth 3 (three) times daily with meals. 10/09/23   [provider]  SUMAtriptan  (IMITREX ) 50 MG tablet Take 50 mg by mouth daily as needed for migraine.    [provider]    Social History:  reports that she has never smoked. She has been exposed to tobacco smoke. She has never used smokeless tobacco. She reports that she does not  currently use alcohol after a past usage of about 2.0 standard drinks of alcohol per week. She reports that she does not use drugs.    Physical Exam: Vitals:   11/19/24 1715 11/19/24 1730 11/19/24 1745 11/19/24 1800  BP: (!) 184/122 (!) 183/113 (!) 191/117 (!) 186/122  Pulse: 84 86 86 86  Resp:      Temp:      TempSrc:      SpO2: 100% 100% 100% 99%  Weight:      Height:        Gen: NAD HENT: NCAT CV: normal heart sounds Lung: CTAB Abd: No TTP, normal bowel sounds MSK: No asymmetry, good bulk and tone, Fistula on LUE.  Neuro: alert and oriented   Labs on Admission: I have personally reviewed following labs and imaging studies  CBC: Recent Labs  Lab 11/19/24 1348  WBC 10.1  HGB 13.5  HCT 43.8  MCV 89.9  PLT 347   Basic Metabolic Panel: Recent Labs  Lab 11/19/24 1348  NA 131*  K 5.0  CL  80*  CO2 <7*  GLUCOSE 443*  BUN 81*  CREATININE 12.90*  CALCIUM  7.6*   GFR: Estimated Creatinine Clearance: 6.7 mL/min (A) (by C-G formula based on SCr of 12.9 mg/dL (H)). Liver Function Tests: Recent Labs  Lab 11/19/24 1348  AST 20  ALT 8  ALKPHOS 99  BILITOT 0.4  PROT 9.2*  ALBUMIN 4.2   Recent Labs  Lab 11/19/24 1348  LIPASE 21   No results for input(s): AMMONIA in the last 168 hours. Coagulation Profile: No results for input(s): INR, PROTIME in the last 168 hours. Cardiac Enzymes: No results for input(s): CKTOTAL, CKMB, CKMBINDEX, TROPONINI, TROPONINIHS in the last 168 hours. BNP (last 3 results) No results for input(s): BNP in the last 8760 hours. HbA1C: No results for input(s): HGBA1C in the last 72 hours. CBG: Recent Labs  Lab 11/19/24 1348  GLUCAP 398*   Lipid Profile: No results for input(s): CHOL, HDL, LDLCALC, TRIG, CHOLHDL, LDLDIRECT in the last 72 hours. Thyroid Function Tests: No results for input(s): TSH, T4TOTAL, FREET4, T3FREE, THYROIDAB in the last 72 hours. Anemia Panel: No results for input(s): VITAMINB12, FOLATE, FERRITIN, TIBC, IRON , RETICCTPCT in the last 72 hours. Urine analysis:    Component Value Date/Time   COLORURINE YELLOW (A) 03/06/2024 0120   APPEARANCEUR CLEAR (A) 03/06/2024 0120   LABSPEC 1.008 03/06/2024 0120   PHURINE 9.0 (H) 03/06/2024 0120   GLUCOSEU >=500 (A) 03/06/2024 0120   HGBUR NEGATIVE 03/06/2024 0120   BILIRUBINUR NEGATIVE 03/06/2024 0120   KETONESUR NEGATIVE 03/06/2024 0120   PROTEINUR >=300 (A) 03/06/2024 0120   NITRITE NEGATIVE 03/06/2024 0120   LEUKOCYTESUR NEGATIVE 03/06/2024 0120    Radiological Exams on Admission: I have personally reviewed images DG Chest Portable 1 View Result Date: 11/19/2024 CLINICAL DATA:  Cough, shortness of breath EXAM: PORTABLE CHEST 1 VIEW COMPARISON:  October 10, 2023 FINDINGS: The heart size and mediastinal contours are  within normal limits. Both lungs are clear. The visualized skeletal structures are unremarkable. IMPRESSION: No active disease. Electronically Signed   By: Lynwood Landy Raddle M.D.   On: 11/19/2024 16:35    EKG: My personal interpretation of EKG shows: Pending   Assessment/Plan Principal Problem:   Diabetic ketoacidosis without coma associated with type 1 diabetes mellitus (HCC) Active Problems:   Uncontrolled type 1 diabetes mellitus with hyperglycemia, with long-term current use of insulin  (HCC)   End-stage renal disease on hemodialysis (HCC)   Hypertension  Mixed diabetic hyperlipidemia associated with type 1 diabetes mellitus (HCC)   Depression, major, recurrent, moderate (HCC)   Neuropathy   Hyponatremia   DKA, Type 1 DM Pt presented with GI symptoms of nausea, vomiting and found to be in DKA. Secondary to URI. Admit to step down.  - Start on insulin  drip per protocol - potassium supplementation as needed to keep K>4 - Normal saline/LR at 75 mL/hr until CBG less than 250 - Switch to D5-1/2 NS when 1 CBG less than 250 - Nothing by mouth  - BMET every 4 hours - CBG Q1H - Once anion gap closed 2, start diet and transition off endo-tool using protocol.  - A1c pending, diabetes coordinator consulted for re-enforcement.   Hypertension: Home medicine is amlodipine  10 mg, Coreg  6.25 mg twice daily, and volume management via HD.  Will continue home medicines, and use IV labetalol  as needed.  ESRD: Gets home HD.  Last HD session was Thursday.  Will discuss with nephrology as she will need inpatient HD but no urgent needs.  Her acidosis secondary to DKA and she is getting treatment for that.  Continue home sevelamer .  Hyperlipidemia: Continue home Lipitor .  MDD/GAD: Continue home SSRI  Diabetic neuropathy: Continue home pregabalin  50 mg  Chronic pain syndrome: Continue oxycodone  5 mg. States pain pain generator is neuropathy.   VTE prophylaxis:  SQ Heparin   Diet: N.p.o. Code  Status:  Full Code Telemetry:  Admission status: Inpatient, Step Down Unit Patient is from: Home Anticipated d/c is to: Home Anticipated d/c is in: 2-3 days   Family Communication: Updated at bedside  Consults called: None   Severity of Illness: The appropriate patient status for this patient is INPATIENT. Inpatient status is judged to be reasonable and necessary in order to provide the required intensity of service to ensure the patient's safety. The patient's presenting symptoms, physical exam findings, and initial radiographic and laboratory data in the context of their chronic comorbidities is felt to place them at high risk for further clinical deterioration. Furthermore, it is not anticipated that the patient will be medically stable for discharge from the hospital within 2 midnights of admission.   * I certify that at the point of admission it is my clinical judgment that the patient will require inpatient hospital care spanning beyond 2 midnights from the point of admission due to high intensity of service, high risk for further deterioration and high frequency of surveillance required.DEWAINE Morene Bathe, MD [facility]. [facility]      [1]  Allergies Allergen Reactions   Losartan  Swelling    Angioedema of tongue   "

## 2024-11-20 ENCOUNTER — Ambulatory Visit (HOSPITAL_BASED_OUTPATIENT_CLINIC_OR_DEPARTMENT_OTHER): Payer: Self-pay | Admitting: Nurse Practitioner

## 2024-11-20 DIAGNOSIS — G894 Chronic pain syndrome: Secondary | ICD-10-CM

## 2024-11-20 DIAGNOSIS — N186 End stage renal disease: Secondary | ICD-10-CM

## 2024-11-20 DIAGNOSIS — E1010 Type 1 diabetes mellitus with ketoacidosis without coma: Principal | ICD-10-CM

## 2024-11-20 DIAGNOSIS — Z91199 Patient's noncompliance with other medical treatment and regimen due to unspecified reason: Secondary | ICD-10-CM

## 2024-11-20 LAB — CBG MONITORING, ED
Glucose-Capillary: 124 mg/dL — ABNORMAL HIGH (ref 70–99)
Glucose-Capillary: 126 mg/dL — ABNORMAL HIGH (ref 70–99)
Glucose-Capillary: 156 mg/dL — ABNORMAL HIGH (ref 70–99)
Glucose-Capillary: 159 mg/dL — ABNORMAL HIGH (ref 70–99)
Glucose-Capillary: 163 mg/dL — ABNORMAL HIGH (ref 70–99)
Glucose-Capillary: 163 mg/dL — ABNORMAL HIGH (ref 70–99)
Glucose-Capillary: 165 mg/dL — ABNORMAL HIGH (ref 70–99)
Glucose-Capillary: 166 mg/dL — ABNORMAL HIGH (ref 70–99)
Glucose-Capillary: 178 mg/dL — ABNORMAL HIGH (ref 70–99)
Glucose-Capillary: 182 mg/dL — ABNORMAL HIGH (ref 70–99)
Glucose-Capillary: 184 mg/dL — ABNORMAL HIGH (ref 70–99)
Glucose-Capillary: 193 mg/dL — ABNORMAL HIGH (ref 70–99)
Glucose-Capillary: 199 mg/dL — ABNORMAL HIGH (ref 70–99)
Glucose-Capillary: 206 mg/dL — ABNORMAL HIGH (ref 70–99)
Glucose-Capillary: 216 mg/dL — ABNORMAL HIGH (ref 70–99)
Glucose-Capillary: 227 mg/dL — ABNORMAL HIGH (ref 70–99)
Glucose-Capillary: 235 mg/dL — ABNORMAL HIGH (ref 70–99)
Glucose-Capillary: 248 mg/dL — ABNORMAL HIGH (ref 70–99)
Glucose-Capillary: 270 mg/dL — ABNORMAL HIGH (ref 70–99)

## 2024-11-20 LAB — BASIC METABOLIC PANEL WITH GFR
Anion gap: 27 — ABNORMAL HIGH (ref 5–15)
Anion gap: 29 — ABNORMAL HIGH (ref 5–15)
Anion gap: 35 — ABNORMAL HIGH (ref 5–15)
BUN: 82 mg/dL — ABNORMAL HIGH (ref 6–20)
BUN: 83 mg/dL — ABNORMAL HIGH (ref 6–20)
BUN: 84 mg/dL — ABNORMAL HIGH (ref 6–20)
CO2: 16 mmol/L — ABNORMAL LOW (ref 22–32)
CO2: 18 mmol/L — ABNORMAL LOW (ref 22–32)
CO2: 20 mmol/L — ABNORMAL LOW (ref 22–32)
Calcium: 6.8 mg/dL — ABNORMAL LOW (ref 8.9–10.3)
Calcium: 6.9 mg/dL — ABNORMAL LOW (ref 8.9–10.3)
Calcium: 6.9 mg/dL — ABNORMAL LOW (ref 8.9–10.3)
Chloride: 88 mmol/L — ABNORMAL LOW (ref 98–111)
Chloride: 92 mmol/L — ABNORMAL LOW (ref 98–111)
Chloride: 93 mmol/L — ABNORMAL LOW (ref 98–111)
Creatinine, Ser: 13 mg/dL — ABNORMAL HIGH (ref 0.44–1.00)
Creatinine, Ser: 13.1 mg/dL — ABNORMAL HIGH (ref 0.44–1.00)
Creatinine, Ser: 13.2 mg/dL — ABNORMAL HIGH (ref 0.44–1.00)
GFR, Estimated: 3 mL/min — ABNORMAL LOW
GFR, Estimated: 4 mL/min — ABNORMAL LOW
GFR, Estimated: 4 mL/min — ABNORMAL LOW
Glucose, Bld: 183 mg/dL — ABNORMAL HIGH (ref 70–99)
Glucose, Bld: 225 mg/dL — ABNORMAL HIGH (ref 70–99)
Glucose, Bld: 271 mg/dL — ABNORMAL HIGH (ref 70–99)
Potassium: 4.1 mmol/L (ref 3.5–5.1)
Potassium: 4.2 mmol/L (ref 3.5–5.1)
Potassium: 4.4 mmol/L (ref 3.5–5.1)
Sodium: 139 mmol/L (ref 135–145)
Sodium: 139 mmol/L (ref 135–145)
Sodium: 140 mmol/L (ref 135–145)

## 2024-11-20 LAB — CBC
HCT: 36.2 % (ref 36.0–46.0)
Hemoglobin: 11.8 g/dL — ABNORMAL LOW (ref 12.0–15.0)
MCH: 28.4 pg (ref 26.0–34.0)
MCHC: 32.6 g/dL (ref 30.0–36.0)
MCV: 87 fL (ref 80.0–100.0)
Platelets: 269 K/uL (ref 150–400)
RBC: 4.16 MIL/uL (ref 3.87–5.11)
RDW: 13.5 % (ref 11.5–15.5)
WBC: 11.9 K/uL — ABNORMAL HIGH (ref 4.0–10.5)
nRBC: 0 % (ref 0.0–0.2)

## 2024-11-20 LAB — RENAL FUNCTION PANEL
Albumin: 3.5 g/dL (ref 3.5–5.0)
Anion gap: 28 — ABNORMAL HIGH (ref 5–15)
BUN: 83 mg/dL — ABNORMAL HIGH (ref 6–20)
CO2: 18 mmol/L — ABNORMAL LOW (ref 22–32)
Calcium: 6.9 mg/dL — ABNORMAL LOW (ref 8.9–10.3)
Chloride: 93 mmol/L — ABNORMAL LOW (ref 98–111)
Creatinine, Ser: 13.3 mg/dL — ABNORMAL HIGH (ref 0.44–1.00)
GFR, Estimated: 3 mL/min — ABNORMAL LOW
Glucose, Bld: 212 mg/dL — ABNORMAL HIGH (ref 70–99)
Phosphorus: 9.8 mg/dL — ABNORMAL HIGH (ref 2.5–4.6)
Potassium: 4 mmol/L (ref 3.5–5.1)
Sodium: 139 mmol/L (ref 135–145)

## 2024-11-20 LAB — HEPATITIS B SURFACE ANTIGEN: Hepatitis B Surface Ag: NONREACTIVE

## 2024-11-20 LAB — HIV ANTIBODY (ROUTINE TESTING W REFLEX): HIV Screen 4th Generation wRfx: NONREACTIVE

## 2024-11-20 LAB — BETA-HYDROXYBUTYRIC ACID
Beta-Hydroxybutyric Acid: 1.04 mmol/L — ABNORMAL HIGH (ref 0.05–0.27)
Beta-Hydroxybutyric Acid: 2.82 mmol/L — ABNORMAL HIGH (ref 0.05–0.27)
Beta-Hydroxybutyric Acid: 3.95 mmol/L — ABNORMAL HIGH (ref 0.05–0.27)

## 2024-11-20 LAB — HEMOGLOBIN A1C
Hgb A1c MFr Bld: 7.1 % — ABNORMAL HIGH (ref 4.8–5.6)
Mean Plasma Glucose: 157.07 mg/dL

## 2024-11-20 MED ORDER — PENTAFLUOROPROP-TETRAFLUOROETH EX AERO: 1.0000 | INHALATION_SPRAY | CUTANEOUS | Status: DC | PRN

## 2024-11-20 MED ORDER — SODIUM CHLORIDE 0.9 % IV SOLN
12.5000 mg | Freq: Four times a day (QID) | INTRAVENOUS | Status: DC | PRN
  Administered 2024-11-21: 12.5 mg via INTRAVENOUS
  Filled 2024-11-20: qty 12.5

## 2024-11-20 MED ORDER — HEPARIN SODIUM (PORCINE) 1000 UNIT/ML DIALYSIS: 1000.0000 [IU] | INTRAMUSCULAR | Status: DC | PRN

## 2024-11-20 MED ORDER — HYDRALAZINE HCL 50 MG PO TABS
100.0000 mg | ORAL_TABLET | Freq: Three times a day (TID) | ORAL | Status: DC
  Administered 2024-11-20 – 2024-11-21 (×3): 100 mg via ORAL
  Filled 2024-11-20 (×3): qty 2

## 2024-11-20 MED ORDER — CHLORHEXIDINE GLUCONATE CLOTH 2 % EX PADS
6.0000 | MEDICATED_PAD | Freq: Every day | CUTANEOUS | Status: DC
  Filled 2024-11-20 (×3): qty 6

## 2024-11-20 MED ORDER — OSELTAMIVIR PHOSPHATE 30 MG PO CAPS
30.0000 mg | ORAL_CAPSULE | ORAL | Status: DC
  Administered 2024-11-20: 30 mg via ORAL
  Filled 2024-11-20: qty 1

## 2024-11-20 MED ORDER — METOCLOPRAMIDE HCL 5 MG/ML IJ SOLN
10.0000 mg | Freq: Four times a day (QID) | INTRAMUSCULAR | Status: DC | PRN
  Administered 2024-11-20 – 2024-11-21 (×3): 10 mg via INTRAVENOUS
  Filled 2024-11-20 (×3): qty 2

## 2024-11-20 MED ORDER — LIDOCAINE-PRILOCAINE 2.5-2.5 % EX CREA: 1.0000 | TOPICAL_CREAM | CUTANEOUS | Status: DC | PRN

## 2024-11-20 MED ORDER — HYDRALAZINE HCL 20 MG/ML IJ SOLN
50.0000 mg | Freq: Once | INTRAMUSCULAR | Status: AC
  Administered 2024-11-20: 50 mg via INTRAVENOUS
  Filled 2024-11-20: qty 3

## 2024-11-20 MED ORDER — OSELTAMIVIR PHOSPHATE 30 MG PO CAPS
30.0000 mg | ORAL_CAPSULE | ORAL | Status: DC
  Filled 2024-11-20: qty 1

## 2024-11-20 MED ORDER — ONDANSETRON HCL 4 MG/2ML IJ SOLN: 4.0000 mg | Freq: Four times a day (QID) | INTRAMUSCULAR | Status: DC | PRN

## 2024-11-20 MED ORDER — HYDRALAZINE HCL 50 MG PO TABS
25.0000 mg | ORAL_TABLET | Freq: Three times a day (TID) | ORAL | Status: DC
  Administered 2024-11-20: 25 mg via ORAL
  Filled 2024-11-20: qty 1

## 2024-11-20 NOTE — Progress Notes (Signed)
 HHD pt with outpt at [facility] on TTS. Navigator following to assist with any HD needs.  Suzen Satchel Dialysis Navigator [phone]

## 2024-11-20 NOTE — Progress Notes (Signed)
" °   11/20/24 1741  Vitals  Temp 98.6 F (37 C)  Temp Source Oral  BP (!) 220/110  MAP (mmHg) 137  BP Location Left Arm  BP Method Automatic  Patient Position (if appropriate) Lying  Pulse Rate 89  Pulse Rate Source Monitor  ECG Heart Rate 92  Resp (!) 21  Oxygen Therapy  SpO2 100 %  O2 Device Room Air  Patient Activity (if Appropriate) In bed  Pulse Oximetry Type Continuous  Oximetry Probe Site Changed No  During Treatment Monitoring  Blood Flow Rate (mL/min) 0 mL/min  Arterial Pressure (mmHg) -25.45 mmHg  Venous Pressure (mmHg) 318.97 mmHg  TMP (mmHg) 5.66 mmHg  Ultrafiltration Rate (mL/min) 398 mL/min  Dialysate Flow Rate (mL/min) 300 ml/min  Dialysate Potassium Concentration 3  Dialysate Calcium  Concentration 2.5  Duration of HD Treatment -hour(s) 3 hour(s)  Cumulative Fluid Removed (mL) per Treatment  0.09  HD Safety Checks Performed Yes  Intra-Hemodialysis Comments Tx completed  Post Treatment  Dialyzer Clearance Lightly streaked  Hemodialysis Intake (mL) 0 mL  Liters Processed 65.5  Fluid Removed (mL) 0 mL  Tolerated HD Treatment Yes  Fistula / Graft Right Upper arm Arteriovenous fistula  Placement Date: 03/19/22   Placed prior to admission: Yes  Orientation: Right  Access Location: Upper arm  Access Type: Arteriovenous fistula  Site Condition No complications  Fistula / Graft Assessment Present;Thrill;Bruit  Status Deaccessed;Flushed;Patent  Drainage Description None   Pt tolerated tx. No fluid removal today. Med given by ED nurse Hydralazine  for bp. Made NP Breeze aware of bp.Report given to Nurse. "

## 2024-11-20 NOTE — ED Notes (Signed)
 Fsbs 156.

## 2024-11-20 NOTE — ED Notes (Signed)
 Requested lab to draw pt due labs.

## 2024-11-20 NOTE — Progress Notes (Signed)
 " PROGRESS NOTE Emma Thomas    DOB: 1996-04-05, 28 y.o.  FMW:[id number]    Code Status: Full Code   DOA: 11/19/2024   LOS: 1  Brief hospital course  Emma Thomas is a 29 y.o. female with a PMH significant for hypertension, hyperlipidemia, type 1 diabetes with history of DKA presenting to the ED with nausea/vomiting and failure to tolerate p.o. intake.  She reports having URI symptoms for the last 3 days. Last did HD on Thursday.   ED course: CBC unremarkable.  CMP with mild hyponatremia, severe acidosis with significant hyperglycemia and ESRD changes.  VBG showed bicarb 19.  BHB greater than 8.  Lactic acid normal.  Respiratory panel positive for influenza B.  Chest x-ray without any acute findings.  Patient started on insulin  drip.  Nephrology consulted for HD.   Patient was admitted to medicine service for further workup and management of influenza, DKA, ESRD missing HD as outlined in detail below.  11/20/2024 -refractory nausea and vomiting.   Assessment & Plan  Principal Problem:   Diabetic ketoacidosis without coma associated with type 1 diabetes mellitus (HCC) Active Problems:   Uncontrolled type 1 diabetes mellitus with hyperglycemia, with long-term current use of insulin  (HCC)   End-stage renal disease on hemodialysis (HCC)   Hypertension   Mixed diabetic hyperlipidemia associated with type 1 diabetes mellitus (HCC)   Depression, major, recurrent, moderate (HCC)   Neuropathy   Hyponatremia  DKA, Type 1 DM-  - continue insulin  gtt and d5 fluids concurrently until anion gap closed. - monitor K+ and supplementation as needed to keep K>4 - transition to PO when can tolerate PO but currently keep NPO due to significant nausea and acidosis.  - BMET every 4 hours - CBG Q1H - diabetes coordinator consulted  Influenza B- stable ORA. Significant N/V.  - tamiflu  ordered - supportive care  Intractable N/V Emesis appears to be dark brown. Concern for blood. Hgb has mild  decrease today but has been getting excessive IV fluids so component of hemodilution.  - hemoccult gastric testing ordered - consider GI consult and EGD for evaluation when stabilized - anti-emetics PRN - CBC am   Acute on chronic anemia- of chronic disease. Possible blood loss in emesis.  - no nsaids - CBC am    Hypertension: Home medicine is amlodipine  10 mg, Coreg  6.25 mg twice daily, and volume management via HD.   - added scheduled hydralazine  with significantly elevated pressures    ESRD: Gets home HD.  Last HD session was Thursday.   - Nephrology managing - next HD today - Continue home sevelamer .   Hyperlipidemia: Continue home Lipitor .   MDD/GAD: Continue home SSRI   Diabetic neuropathy: Continue home pregabalin  50 mg   Chronic pain syndrome: Continue oxycodone  5 mg.  Body mass index is 27.75 kg/m.  VTE ppx: heparin  injection 5,000 Units Start: 11/20/24 0200  Diet:     Diet   Diet NPO time specified   Consultants: Nephrology   Subjective 11/20/2024    Pt reports significant nausea and vomiting. Denies SOB. No abdominal pain.    Objective  Blood pressure (!) 184/108, pulse 81, temperature (!) 97.4 F (36.3 C), temperature source Axillary, resp. rate 17, height 5' 6 (1.676 m), weight 78 kg, SpO2 95%.  Intake/Output Summary (Last 24 hours) at 11/20/2024 0806 Last data filed at 11/20/2024 0701 Gross per 24 hour  Intake 3610 ml  Output --  Net 3610 ml   [employer]  11/19/24 1346  Weight: 78 kg    Physical Exam:  General: awake, alert, in acute distress following vomit Respiratory: normal respiratory effort. Cardiovascular: extremities well perfused, quick capillary refill, normal S1/S2, RRR, no JVD, murmurs Gastrointestinal: soft, NT, ND Nervous: A&O x3. no gross focal neurologic deficits, normal speech Extremities: moves all equally, no edema, normal tone Skin: dry, intact, normal temperature, normal color. No rashes, lesions or ulcers on exposed  skin  Labs   I have personally reviewed the following labs and imaging studies CBC    Component Value Date/Time   WBC 10.1 11/19/2024 1348   RBC 4.87 11/19/2024 1348   HGB 13.5 11/19/2024 1348   HGB 8.7 (L) 10/25/2019 1022   HCT 43.8 11/19/2024 1348   HCT 26.0 (L) 10/25/2019 1022   PLT 347 11/19/2024 1348   PLT 358 10/25/2019 1022   MCV 89.9 11/19/2024 1348   MCV 89 10/25/2019 1022   MCH 27.7 11/19/2024 1348   MCHC 30.8 11/19/2024 1348   RDW 13.3 11/19/2024 1348   RDW 11.8 10/25/2019 1022   LYMPHSABS 1.5 09/04/2024 0300   MONOABS 0.6 09/04/2024 0300   EOSABS 0.4 09/04/2024 0300   BASOSABS 0.1 09/04/2024 0300      Latest Ref Rng & Units 11/20/2024    4:49 AM 11/19/2024   11:58 PM 11/19/2024    7:00 PM  BMP  Glucose 70 - 99 mg/dL 816  728  535   BUN 6 - 20 mg/dL 84  83  81   Creatinine 0.44 - 1.00 mg/dL 86.79  86.89  87.99   Sodium 135 - 145 mmol/L 140  139  135   Potassium 3.5 - 5.1 mmol/L 4.1  4.4  4.8   Chloride 98 - 111 mmol/L 93  88  86   CO2 22 - 32 mmol/L 20  16  <7   Calcium  8.9 - 10.3 mg/dL 6.9  6.9  6.6    DG Chest Portable 1 View Result Date: 11/19/2024 CLINICAL DATA:  Cough, shortness of breath EXAM: PORTABLE CHEST 1 VIEW COMPARISON:  October 10, 2023 FINDINGS: The heart size and mediastinal contours are within normal limits. Both lungs are clear. The visualized skeletal structures are unremarkable. IMPRESSION: No active disease. Electronically Signed   By: Lynwood Landy Raddle M.D.   On: 11/19/2024 16:35   Disposition Plan & Communication  Patient status: Inpatient  Admitted From: Home Planned disposition location: Home Anticipated discharge date: TBD pending clinical course   Family Communication: none at bedside     Author: Marien LITTIE Piety, DO [facility] 11/20/2024, 8:06 AM   Available by Epic secure chat 7AM-7PM. If 7PM-7AM, please contact night-coverage.  [facility] contact information found on [email or web address].  "

## 2024-11-20 NOTE — Inpatient Diabetes Management (Addendum)
 Inpatient Diabetes Program Recommendations  AACE/ADA: New Consensus Statement on Inpatient Glycemic Control   Target Ranges:  Prepandial:   less than 140 mg/dL      Peak postprandial:   less than 180 mg/dL (1-2 hours)      Critically ill patients:  140 - 180 mg/dL    Latest Reference Range & Units 11/19/24 20:17 11/19/24 21:21 11/19/24 22:27 11/19/24 23:40 11/20/24 00:41 11/20/24 04:13 11/20/24 05:44 11/20/24 06:37 11/20/24 07:34  Glucose-Capillary 70 - 99 mg/dL 610 (H) 634 (H) 703 (H) 255 (H) 248 (H) 178 (H) 163 (H) 166 (H) 126 (H)    Latest Reference Range & Units 11/19/24 13:48 11/19/24 19:00 11/19/24 23:58 11/20/24 04:49  CO2 22 - 32 mmol/L <7 (L) <7 (L) 16 (L) 20 (L)  Glucose 70 - 99 mg/dL 556 (H) 535 (H) 728 (H) 183 (H)  Mean Plasma Glucose mg/dL  842.92    BUN 6 - 20 mg/dL 81 (H) 81 (H) 83 (H) 84 (H)  Creatinine 0.44 - 1.00 mg/dL 87.09 (H) 87.99 (H) 86.89 (H) 13.20 (H)  Calcium  8.9 - 10.3 mg/dL 7.6 (L) 6.6 (L) 6.9 (L) 6.9 (L)  Anion gap 5 - 15  NOT CALCULATED NOT CALCULATED 35 (H) 27 (H)    Latest Reference Range & Units 11/19/24 16:22 11/19/24 19:00 11/19/24 23:58 11/20/24 04:49  Beta-Hydroxybutyric Acid 0.05 - 0.27 mmol/L >8.00 (H) >8.00 (H) 3.95 (H) 1.04 (H)   Review of Glycemic Control  Diabetes history: DM1 Outpatient Diabetes medications: T-Slim pump with Humalog  insulin  Current orders for Inpatient glycemic control: IV insulin   Inpatient Diabetes Program Recommendations:    Insulin : At time of transition from IV to SQ insulin , please consider ordering insulin  glargine 14 units Q24H, CBGs Q4H, Novolog  0-6 units Q4H, and if eating Novolog  2 units TID with meals if patient eats at least 50% of meals.  NOTE: Patient admitted with DKA with initial lab glucose 443 mg/dl on 06/20/72 at 86:53.  Per chart review, patient has DM1 and sees [facility]. Patient had televisit with Jenna Nicole Brothers, PA on 06/27/24 and per office note the following should be patient's insulin   pump settings:  Pump Profile settings - Profile 1 ACTIVE  Time Basal Rate (units/hr) 12:00 AM 0.500 3:00 AM 0.4 6:00 AM 0.600 11:00 AM 0.600 4:00 PM 0.800 9:00 PM 0.600 Total Daily Basal: 14.800 units  Time Correction Factor (units:mg/dL) 87:99 AM 8:34 6:99 AM 1:80 6:00 AM 1:50 11:00 AM 1:50 4:00 PM 1:40 9:00 PM 1:55  Time Carb Ratio (units:grams) 12:00 AM 1:8.0 3:00 AM 1:8.0 6:00 AM 1:8.0 11:00 AM 1:8.0 4:00 PM 1:10 9:00 PM 1:8.0  Time Target BG (mg/dL) 87:99 AM 889 6:99 AM 889 6:00 AM 110 11:00 AM 110 4:00 PM 110 9:00 PM 110   Addendum 11/20/24@13 :48-Spoke with patient at bedside in ED. Patient kept eyes closed but answered questions with short answers. Patient confirms that she took of her insulin  pump at home, she does not recall when she actually removed it.  Patient states she uses the T-Slim insulin  pump along with Dexcom G7. Patient reports that she does not currently have on a Dexcom G7 sensor. Patient does not have any pump supplies at the hospital and prefers to use SQ insulin  when transitioned off IV insulin  until she is discharged. She will plan to resume her insulin  pump at home. Patient states that she has plenty of insulin  pump supplies, Dexcom G7 sensors, and Humalog  insulin  at home. Encouraged patient to follow up with Endocrinology  regarding DM control. Patient verbalized understanding and has no questions at this time.  Thanks, Earnie Gainer, RN, MSN, CDCES Diabetes Coordinator Inpatient Diabetes Program [phone] (Team Pager from 8am to 5pm)

## 2024-11-20 NOTE — Progress Notes (Signed)
 " [facility]  ROUNDING NOTE   Subjective:   Emma Thomas is a 29 y.o female with past medical history of diabetes, anemia, and diabetes type 1, and end stage renal disease on home hemodialysis dialysis. Patient presents to the ED with flu like symptoms. She has been admitted for DKA (diabetic ketoacidosis) (HCC) [E11.10]  Patient is known to our practice and is followed outpatient by [facility] for home HD. She reports her last treatment was on Thursday. Reprts nausea started this past weekend. No known sick contacts. Also has body aches. Remains nauseous during visit, dry heaves. Home clinic states patient received 2 respite treatments last week due to not feeling well.   Labs on ED arrival concerning for sodium 131, S bicarb <7, BUN 81, glucose 443, creatinine 12.9 with GFR 4. Patient positive for Flu B. Placed insulin  drip.  We have been consulted to manage dialysis needs.    Objective:  Vital signs in last 24 hours:  Temp:  [97.4 F (36.3 C)-98.9 F (37.2 C)] 98.9 F (37.2 C) (01/05 1400) Pulse Rate:  [76-95] 81 (01/05 1400) Resp:  [6-27] 26 (01/05 1400) BP: (169-224)/(100-133) 219/116 (01/05 1400) SpO2:  [92 %-100 %] 97 % (01/05 1400)  Weight change:  Filed Weights   11/19/24 1346  Weight: 78 kg    Intake/Output: I/O last 3 completed shifts: In: 3560 [I.V.:1000; IV Piggyback:2560] Out: -    Intake/Output this shift:  Total I/O In: 50 [IV Piggyback:50] Out: -   Physical Exam: General: Ill appearing  Head: Normocephalic, atraumatic. Moist oral mucosal membranes  Eyes: Anicteric  Lungs:  Clear to auscultation, normal effort  Heart: Regular rate and rhythm  Abdomen:  Soft, nontender  Extremities:  No peripheral edema.  Neurologic: Awake, alert, conversant  Skin: Warm,dry, no rash  Access: Lt AVF    Basic Metabolic Panel: Recent Labs  Lab 11/19/24 1348 11/19/24 1900 11/19/24 2358 11/20/24 0449 11/20/24 0947  NA 131* 135 139 140 139   K 5.0 4.8 4.4 4.1 4.2  CL 80* 86* 88* 93* 92*  CO2 <7* <7* 16* 20* 18*  GLUCOSE 443* 464* 271* 183* 225*  BUN 81* 81* 83* 84* 82*  CREATININE 12.90* 12.00* 13.10* 13.20* 13.00*  CALCIUM  7.6* 6.6* 6.9* 6.9* 6.8*    Liver Function Tests: Recent Labs  Lab 11/19/24 1348  AST 20  ALT 8  ALKPHOS 99  BILITOT 0.4  PROT 9.2*  ALBUMIN 4.2   Recent Labs  Lab 11/19/24 1348  LIPASE 21   No results for input(s): AMMONIA in the last 168 hours.  CBC: Recent Labs  Lab 11/19/24 1348  WBC 10.1  HGB 13.5  HCT 43.8  MCV 89.9  PLT 347    Cardiac Enzymes: No results for input(s): CKTOTAL, CKMB, CKMBINDEX, TROPONINI in the last 168 hours.  BNP: Invalid input(s): POCBNP  CBG: Recent Labs  Lab 11/20/24 0938 11/20/24 1034 11/20/24 1146 11/20/24 1241 11/20/24 1354  GLUCAP 206* 216* 163* 184* 199*    Microbiology: Results for orders placed or performed during the hospital encounter of 11/19/24  Resp panel by RT-PCR (RSV, Flu A&B, Covid) Anterior Nasal Swab     Status: Abnormal   Collection Time: 11/19/24  4:07 PM   Specimen: Anterior Nasal Swab  Result Value Ref Range Status   SARS Coronavirus 2 by RT PCR NEGATIVE NEGATIVE Final    Comment: (NOTE) SARS-CoV-2 target nucleic acids are NOT DETECTED.  The SARS-CoV-2 RNA is generally detectable in upper respiratory  specimens during the acute phase of infection. The lowest concentration of SARS-CoV-2 viral copies this assay can detect is 138 copies/mL. A negative result does not preclude SARS-Cov-2 infection and should not be used as the sole basis for treatment or other patient management decisions. A negative result may occur with  improper specimen collection/handling, submission of specimen other than nasopharyngeal swab, presence of viral mutation(s) within the areas targeted by this assay, and inadequate number of viral copies(<138 copies/mL). A negative result must be combined with clinical observations,  patient history, and epidemiological information. The expected result is Negative.  Fact Sheet for Patients:  bloggercourse.com  Fact Sheet for Healthcare Providers:  seriousbroker.it  This test is no t yet approved or cleared by the [place]  FDA and  has been authorized for detection and/or diagnosis of SARS-CoV-2 by FDA under an Emergency Use Authorization (EUA). This EUA will remain  in effect (meaning this test can be used) for the duration of the COVID-19 declaration under Section 564(b)(1) of the Act, 21 U.S.C.section 360bbb-3(b)(1), unless the authorization is terminated  or revoked sooner.       Influenza A by PCR NEGATIVE NEGATIVE Final   Influenza B by PCR POSITIVE (A) NEGATIVE Final    Comment: (NOTE) The Xpert Xpress SARS-CoV-2/FLU/RSV plus assay is intended as an aid in the diagnosis of influenza from Nasopharyngeal swab specimens and should not be used as a sole basis for treatment. Nasal washings and aspirates are unacceptable for Xpert Xpress SARS-CoV-2/FLU/RSV testing.  Fact Sheet for Patients: bloggercourse.com  Fact Sheet for Healthcare Providers: seriousbroker.it  This test is not yet approved or cleared by the [place]  FDA and has been authorized for detection and/or diagnosis of SARS-CoV-2 by FDA under an Emergency Use Authorization (EUA). This EUA will remain in effect (meaning this test can be used) for the duration of the COVID-19 declaration under Section 564(b)(1) of the Act, 21 U.S.C. section 360bbb-3(b)(1), unless the authorization is terminated or revoked.     Resp Syncytial Virus by PCR NEGATIVE NEGATIVE Final    Comment: (NOTE) Fact Sheet for Patients: bloggercourse.com  Fact Sheet for Healthcare Providers: seriousbroker.it  This test is not yet approved or cleared by the [place]  [place] FDA and has been authorized for detection and/or diagnosis of SARS-CoV-2 by FDA under an Emergency Use Authorization (EUA). This EUA will remain in effect (meaning this test can be used) for the duration of the COVID-19 declaration under Section 564(b)(1) of the Act, 21 U.S.C. section 360bbb-3(b)(1), unless the authorization is terminated or revoked.  Performed at [facility] [address] [place], [place] [address]     Coagulation Studies: No results for input(s): LABPROT, INR in the last 72 hours.  Urinalysis: No results for input(s): COLORURINE, LABSPEC, PHURINE, GLUCOSEU, HGBUR, BILIRUBINUR, KETONESUR, PROTEINUR, UROBILINOGEN, NITRITE, LEUKOCYTESUR in the last 72 hours.  Invalid input(s): APPERANCEUR    Imaging: DG Chest Portable 1 View Result Date: 11/19/2024 CLINICAL DATA:  Cough, shortness of breath EXAM: PORTABLE CHEST 1 VIEW COMPARISON:  October 10, 2023 FINDINGS: The heart size and mediastinal contours are within normal limits. Both lungs are clear. The visualized skeletal structures are unremarkable. IMPRESSION: No active disease. Electronically Signed   By: Lynwood Landy Raddle M.D.   On: 11/19/2024 16:35     Medications:    dextrose  5% lactated ringers  50 mL/hr at 11/20/24 0811   insulin  2.8 Units/hr (11/20/24 1356)   lactated ringers  Stopped (11/20/24 0817)   promethazine  (PHENERGAN ) injection (IM or IVPB)  amLODipine   10 mg Oral Daily   atorvastatin   80 mg Oral Daily   carvedilol   6.25 mg Oral BID WC   Chlorhexidine  Gluconate Cloth  6 each Topical Q0600   heparin   5,000 Units Subcutaneous Q8H   hydrALAZINE   25 mg Oral Q8H   pregabalin   50 mg Oral TID   dextrose , heparin , labetalol , lidocaine -prilocaine , metoCLOPramide  (REGLAN ) injection, ondansetron  (ZOFRAN ) IV, oxyCODONE , pentafluoroprop-tetrafluoroeth, promethazine  (PHENERGAN ) injection (IM or IVPB)  Assessment/ Plan:  Emma Thomas is a 29 y.o.   female with past medical history of diabetes, anemia, and diabetes type 1, and end stage renal disease on home hemodialysis dialysis. Patient presents to the ED with flu like symptoms. She has been admitted for DKA (diabetic ketoacidosis) (HCC) [E11.10]  [facility]  End stage renal disease on home hemodialysis. Last treatment received on Thursday. Will perform scheduled dialysis today. No UF d/t GI losses.   2. Acute metabolic acidosis, S bicarb <7 on admission. Give IV bicarb supplementation and will continue to correct with dialysis.   3. DKA with diabetes mellitus type I with chronic kidney disease/renal manifestations: insulin  dependent. Home regimen includes Humalog . Most recent hemoglobin A1c is 7.1 on 11/19/24. Glucose 443 on admission. Placed on insulin  drip and management per primary team.   4. Secondary Hyperparathyroidism: with outpatient labs:None available  Lab Results  Component Value Date   CALCIUM  6.8 (L) 11/20/2024   CAION 0.97 (L) 01/08/2023   PHOS 6.2 (H) 08/26/2023    Prescribed calcitriol , calcium  acetate and sevelamer  outpatient. Will continue to monitor.      LOS: 1 [place] Kirchner 1/5/20262:31 PM   "

## 2024-11-20 NOTE — ED Notes (Addendum)
 SABRA

## 2024-11-20 NOTE — ED Notes (Signed)
 Report given to dialysis. Per dialysis treatment will be given when pt gets a room on the floor.

## 2024-11-20 NOTE — ED Notes (Signed)
 Dialysis at bedside at this time for pt's treatment.

## 2024-11-21 LAB — CBC
HCT: 38.8 % (ref 36.0–46.0)
Hemoglobin: 12.4 g/dL (ref 12.0–15.0)
MCH: 27.6 pg (ref 26.0–34.0)
MCHC: 32 g/dL (ref 30.0–36.0)
MCV: 86.4 fL (ref 80.0–100.0)
Platelets: 282 K/uL (ref 150–400)
RBC: 4.49 MIL/uL (ref 3.87–5.11)
RDW: 13.5 % (ref 11.5–15.5)
WBC: 10.7 K/uL — ABNORMAL HIGH (ref 4.0–10.5)
nRBC: 0 % (ref 0.0–0.2)

## 2024-11-21 LAB — BASIC METABOLIC PANEL WITH GFR
Anion gap: 22 — ABNORMAL HIGH (ref 5–15)
Anion gap: 18 — ABNORMAL HIGH (ref 5–15)
Anion gap: 17 — ABNORMAL HIGH (ref 5–15)
Anion gap: 18 — ABNORMAL HIGH (ref 5–15)
BUN: 37 mg/dL — ABNORMAL HIGH (ref 6–20)
BUN: 38 mg/dL — ABNORMAL HIGH (ref 6–20)
BUN: 39 mg/dL — ABNORMAL HIGH (ref 6–20)
BUN: 41 mg/dL — ABNORMAL HIGH (ref 6–20)
CO2: 22 mmol/L (ref 22–32)
CO2: 25 mmol/L (ref 22–32)
CO2: 25 mmol/L (ref 22–32)
CO2: 23 mmol/L (ref 22–32)
Calcium: 7.6 mg/dL — ABNORMAL LOW (ref 8.9–10.3)
Calcium: 7.1 mg/dL — ABNORMAL LOW (ref 8.9–10.3)
Calcium: 7.2 mg/dL — ABNORMAL LOW (ref 8.9–10.3)
Calcium: 7.1 mg/dL — ABNORMAL LOW (ref 8.9–10.3)
Chloride: 91 mmol/L — ABNORMAL LOW (ref 98–111)
Chloride: 92 mmol/L — ABNORMAL LOW (ref 98–111)
Chloride: 91 mmol/L — ABNORMAL LOW (ref 98–111)
Chloride: 90 mmol/L — ABNORMAL LOW (ref 98–111)
Creatinine, Ser: 7.58 mg/dL — ABNORMAL HIGH (ref 0.44–1.00)
Creatinine, Ser: 8.29 mg/dL — ABNORMAL HIGH (ref 0.44–1.00)
Creatinine, Ser: 8.52 mg/dL — ABNORMAL HIGH (ref 0.44–1.00)
Creatinine, Ser: 8.76 mg/dL — ABNORMAL HIGH (ref 0.44–1.00)
GFR, Estimated: 7 mL/min — ABNORMAL LOW
GFR, Estimated: 6 mL/min — ABNORMAL LOW
GFR, Estimated: 6 mL/min — ABNORMAL LOW
GFR, Estimated: 6 mL/min — ABNORMAL LOW
Glucose, Bld: 184 mg/dL — ABNORMAL HIGH (ref 70–99)
Glucose, Bld: 152 mg/dL — ABNORMAL HIGH (ref 70–99)
Glucose, Bld: 137 mg/dL — ABNORMAL HIGH (ref 70–99)
Glucose, Bld: 205 mg/dL — ABNORMAL HIGH (ref 70–99)
Potassium: 3.4 mmol/L — ABNORMAL LOW (ref 3.5–5.1)
Potassium: 3.7 mmol/L (ref 3.5–5.1)
Potassium: 3.7 mmol/L (ref 3.5–5.1)
Potassium: 3.9 mmol/L (ref 3.5–5.1)
Sodium: 134 mmol/L — ABNORMAL LOW (ref 135–145)
Sodium: 134 mmol/L — ABNORMAL LOW (ref 135–145)
Sodium: 132 mmol/L — ABNORMAL LOW (ref 135–145)
Sodium: 131 mmol/L — ABNORMAL LOW (ref 135–145)

## 2024-11-21 LAB — CBG MONITORING, ED
Glucose-Capillary: 129 mg/dL — ABNORMAL HIGH (ref 70–99)
Glucose-Capillary: 139 mg/dL — ABNORMAL HIGH (ref 70–99)
Glucose-Capillary: 143 mg/dL — ABNORMAL HIGH (ref 70–99)
Glucose-Capillary: 147 mg/dL — ABNORMAL HIGH (ref 70–99)
Glucose-Capillary: 150 mg/dL — ABNORMAL HIGH (ref 70–99)
Glucose-Capillary: 152 mg/dL — ABNORMAL HIGH (ref 70–99)
Glucose-Capillary: 154 mg/dL — ABNORMAL HIGH (ref 70–99)
Glucose-Capillary: 162 mg/dL — ABNORMAL HIGH (ref 70–99)
Glucose-Capillary: 167 mg/dL — ABNORMAL HIGH (ref 70–99)
Glucose-Capillary: 175 mg/dL — ABNORMAL HIGH (ref 70–99)
Glucose-Capillary: 176 mg/dL — ABNORMAL HIGH (ref 70–99)
Glucose-Capillary: 179 mg/dL — ABNORMAL HIGH (ref 70–99)
Glucose-Capillary: 191 mg/dL — ABNORMAL HIGH (ref 70–99)
Glucose-Capillary: 197 mg/dL — ABNORMAL HIGH (ref 70–99)
Glucose-Capillary: 206 mg/dL — ABNORMAL HIGH (ref 70–99)
Glucose-Capillary: 211 mg/dL — ABNORMAL HIGH (ref 70–99)
Glucose-Capillary: 223 mg/dL — ABNORMAL HIGH (ref 70–99)
Glucose-Capillary: 233 mg/dL — ABNORMAL HIGH (ref 70–99)
Glucose-Capillary: 260 mg/dL — ABNORMAL HIGH (ref 70–99)

## 2024-11-21 LAB — BLOOD GAS, VENOUS
Bicarbonate: 19.8 mmol/L — ABNORMAL LOW (ref 20.0–28.0)
O2 Saturation: 90.2 % — AB (ref 0.0–2.0)
Patient temperature: 37
Patient temperature: 37 %
pCO2, Ven: 19 mmHg — CL (ref 44–60)
pH, Ven: 7.18 — AB (ref 7.25–7.43)
pO2, Ven: 6.8 mmHg — AB (ref 32–45)

## 2024-11-21 LAB — URINALYSIS, ROUTINE W REFLEX MICROSCOPIC
Bacteria, UA: NONE SEEN
RBC / HPF: >50 RBC/hpf (ref 0–5)
WBC, UA: >50 WBC/hpf (ref 0–5)

## 2024-11-21 LAB — HEPATITIS B SURFACE ANTIBODY, QUANTITATIVE: Hep B S AB Quant (Post): 21.4 m[IU]/mL

## 2024-11-21 MED ORDER — POTASSIUM CHLORIDE 10 MEQ/100ML IV SOLN
10.0000 meq | INTRAVENOUS | Status: AC
  Administered 2024-11-21 (×2): 10 meq via INTRAVENOUS
  Filled 2024-11-21 (×2): qty 100

## 2024-11-21 MED ORDER — HYDRALAZINE HCL 20 MG/ML IJ SOLN: 5.0000 mg | INTRAMUSCULAR | Status: DC | PRN

## 2024-11-21 MED ORDER — HYDRALAZINE HCL 20 MG/ML IJ SOLN: 10.0000 mg | INTRAMUSCULAR | Status: DC | PRN

## 2024-11-21 MED ORDER — HYDRALAZINE HCL 20 MG/ML IJ SOLN
5.0000 mg | INTRAMUSCULAR | Status: DC | PRN
  Administered 2024-11-21: 5 mg via INTRAVENOUS
  Filled 2024-11-21: qty 1

## 2024-11-21 MED ORDER — LABETALOL HCL 5 MG/ML IV SOLN
20.0000 mg | Freq: Once | INTRAVENOUS | Status: AC
  Administered 2024-11-21: 20 mg via INTRAVENOUS
  Filled 2024-11-21: qty 4

## 2024-11-21 MED ORDER — DEXTROSE IN LACTATED RINGERS 5 % IV SOLN: INTRAVENOUS | Status: DC

## 2024-11-21 NOTE — ED Notes (Signed)
 Notified NP Donati-Garmon and MD Cleatus of pt BP readings not improved with ordered PRN Labetolol Q2hrs.  No new orders at this time.

## 2024-11-21 NOTE — Progress Notes (Signed)
 " [facility]  ROUNDING NOTE   Subjective:   Emma Thomas is a 29 y.o female with past medical history of diabetes, anemia, and diabetes type 1, and end stage renal disease on home hemodialysis dialysis. Patient presents to the ED with flu like symptoms. She has been admitted for DKA (diabetic ketoacidosis) (HCC) [E11.10]  Patient is known to our practice and is followed outpatient by [facility] for home HD. She reports her last treatment was on Thursday.   Update: Sitting up in bed Alert Nausea remains but has improved since yesterday.  Remains on insulin  drip BP elevated  Objective:  Vital signs in last 24 hours:  Temp:  [97.9 F (36.6 C)-98.9 F (37.2 C)] 97.9 F (36.6 C) (01/06 0319) Pulse Rate:  [77-93] 85 (01/06 1030) Resp:  [13-26] 22 (01/06 1030) BP: (175-238)/(98-127) 176/114 (01/06 1030) SpO2:  [94 %-100 %] 96 % (01/06 1030) Weight:  [75.5 kg] 75.5 kg (01/05 1741)  Weight change: -2.5 kg Filed Weights   11/19/24 1346 11/20/24 1400 11/20/24 1741  Weight: 78 kg 75.5 kg 75.5 kg    Intake/Output: I/O last 3 completed shifts: In: 2053.4 [I.V.:1903.4; IV Piggyback:150] Out: 0    Intake/Output this shift:  Total I/O In: 50 [IV Piggyback:50] Out: -   Physical Exam: General: Ill appearing  Head: Normocephalic, atraumatic.   Eyes: Anicteric  Lungs:  Clear to auscultation, normal effort  Heart: Regular rate and rhythm  Abdomen:  Soft, nontender  Extremities:  No peripheral edema.  Neurologic: Awake, alert, conversant  Skin: Warm,dry, no rash  Access: Lt AVF    Basic Metabolic Panel: Recent Labs  Lab 11/20/24 0449 11/20/24 0947 11/20/24 1439 11/21/24 0013 11/21/24 0543  NA 140 139 139 134* 134*  K 4.1 4.2 4.0 3.4* 3.7  CL 93* 92* 93* 91* 92*  CO2 20* 18* 18* 22 25  GLUCOSE 183* 225* 212* 184* 152*  BUN 84* 82* 83* 37* 38*  CREATININE 13.20* 13.00* 13.30* 7.58* 8.29*  CALCIUM  6.9* 6.8* 6.9* 7.6* 7.1*  PHOS  --   --  9.8*  --    --     Liver Function Tests: Recent Labs  Lab 11/19/24 1348 11/20/24 1439  AST 20  --   ALT 8  --   ALKPHOS 99  --   BILITOT 0.4  --   PROT 9.2*  --   ALBUMIN 4.2 3.5   Recent Labs  Lab 11/19/24 1348  LIPASE 21   No results for input(s): AMMONIA in the last 168 hours.  CBC: Recent Labs  Lab 11/19/24 1348 11/20/24 1439 11/21/24 0013  WBC 10.1 11.9* 10.7*  HGB 13.5 11.8* 12.4  HCT 43.8 36.2 38.8  MCV 89.9 87.0 86.4  PLT 347 269 282    Cardiac Enzymes: No results for input(s): CKTOTAL, CKMB, CKMBINDEX, TROPONINI in the last 168 hours.  BNP: Invalid input(s): POCBNP  CBG: Recent Labs  Lab 11/21/24 0636 11/21/24 0729 11/21/24 0844 11/21/24 0946 11/21/24 1047  GLUCAP 150* 175* 154* 147* 129*    Microbiology: Results for orders placed or performed during the hospital encounter of 11/19/24  Resp panel by RT-PCR (RSV, Flu A&B, Covid) Anterior Nasal Swab     Status: Abnormal   Collection Time: 11/19/24  4:07 PM   Specimen: Anterior Nasal Swab  Result Value Ref Range Status   SARS Coronavirus 2 by RT PCR NEGATIVE NEGATIVE Final    Comment: (NOTE) SARS-CoV-2 target nucleic acids are NOT DETECTED.  The SARS-CoV-2  RNA is generally detectable in upper respiratory specimens during the acute phase of infection. The lowest concentration of SARS-CoV-2 viral copies this assay can detect is 138 copies/mL. A negative result does not preclude SARS-Cov-2 infection and should not be used as the sole basis for treatment or other patient management decisions. A negative result may occur with  improper specimen collection/handling, submission of specimen other than nasopharyngeal swab, presence of viral mutation(s) within the areas targeted by this assay, and inadequate number of viral copies(<138 copies/mL). A negative result must be combined with clinical observations, patient history, and epidemiological information. The expected result is Negative.  Fact  Sheet for Patients:  bloggercourse.com  Fact Sheet for Healthcare Providers:  seriousbroker.it  This test is no t yet approved or cleared by the [place]  FDA and  has been authorized for detection and/or diagnosis of SARS-CoV-2 by FDA under an Emergency Use Authorization (EUA). This EUA will remain  in effect (meaning this test can be used) for the duration of the COVID-19 declaration under Section 564(b)(1) of the Act, 21 U.S.C.section 360bbb-3(b)(1), unless the authorization is terminated  or revoked sooner.       Influenza A by PCR NEGATIVE NEGATIVE Final   Influenza B by PCR POSITIVE (A) NEGATIVE Final    Comment: (NOTE) The Xpert Xpress SARS-CoV-2/FLU/RSV plus assay is intended as an aid in the diagnosis of influenza from Nasopharyngeal swab specimens and should not be used as a sole basis for treatment. Nasal washings and aspirates are unacceptable for Xpert Xpress SARS-CoV-2/FLU/RSV testing.  Fact Sheet for Patients: bloggercourse.com  Fact Sheet for Healthcare Providers: seriousbroker.it  This test is not yet approved or cleared by the [place]  FDA and has been authorized for detection and/or diagnosis of SARS-CoV-2 by FDA under an Emergency Use Authorization (EUA). This EUA will remain in effect (meaning this test can be used) for the duration of the COVID-19 declaration under Section 564(b)(1) of the Act, 21 U.S.C. section 360bbb-3(b)(1), unless the authorization is terminated or revoked.     Resp Syncytial Virus by PCR NEGATIVE NEGATIVE Final    Comment: (NOTE) Fact Sheet for Patients: bloggercourse.com  Fact Sheet for Healthcare Providers: seriousbroker.it  This test is not yet approved or cleared by the [place]  FDA and has been authorized for detection and/or diagnosis of SARS-CoV-2 by FDA  under an Emergency Use Authorization (EUA). This EUA will remain in effect (meaning this test can be used) for the duration of the COVID-19 declaration under Section 564(b)(1) of the Act, 21 U.S.C. section 360bbb-3(b)(1), unless the authorization is terminated or revoked.  Performed at [facility], [address] [place], [place] [address]     Coagulation Studies: No results for input(s): LABPROT, INR in the last 72 hours.  Urinalysis: No results for input(s): COLORURINE, LABSPEC, PHURINE, GLUCOSEU, HGBUR, BILIRUBINUR, KETONESUR, PROTEINUR, UROBILINOGEN, NITRITE, LEUKOCYTESUR in the last 72 hours.  Invalid input(s): APPERANCEUR    Imaging: DG Chest Portable 1 View Result Date: 11/19/2024 CLINICAL DATA:  Cough, shortness of breath EXAM: PORTABLE CHEST 1 VIEW COMPARISON:  October 10, 2023 FINDINGS: The heart size and mediastinal contours are within normal limits. Both lungs are clear. The visualized skeletal structures are unremarkable. IMPRESSION: No active disease. Electronically Signed   By: Lynwood Landy Raddle M.D.   On: 11/19/2024 16:35     Medications:    insulin  0.5 Units/hr (11/21/24 1049)   promethazine  (PHENERGAN ) injection (IM or IVPB) Stopped (11/21/24 1008)    amLODipine   10 mg Oral  Daily   atorvastatin   80 mg Oral Daily   carvedilol   6.25 mg Oral BID WC   Chlorhexidine  Gluconate Cloth  6 each Topical Q0600   heparin   5,000 Units Subcutaneous Q8H   hydrALAZINE   100 mg Oral Q8H   oseltamivir   30 mg Oral QODAY   pregabalin   50 mg Oral TID   dextrose , heparin , hydrALAZINE , labetalol , lidocaine -prilocaine , metoCLOPramide  (REGLAN ) injection, ondansetron  (ZOFRAN ) IV, oxyCODONE , pentafluoroprop-tetrafluoroeth, promethazine  (PHENERGAN ) injection (IM or IVPB)  Assessment/ Plan:  Emma Thomas is a 29 y.o.  female with past medical history of diabetes, anemia, and diabetes type 1, and end stage renal disease on home hemodialysis  dialysis. Patient presents to the ED with flu like symptoms. She has been admitted for DKA (diabetic ketoacidosis) (HCC) [E11.10]  [facility] HHD  End stage renal disease on home hemodialysis. Received dialysis yesterday, no UF due to vomiting. Next treatment scheduled for Wednesday  2. Acute metabolic acidosis, S bicarb <7 on admission. Corrected with supplementation and dialysis. Will continue to monitor.   3. DKA with diabetes mellitus type I with chronic kidney disease/renal manifestations: insulin  dependent. Home regimen includes Humalog . Most recent hemoglobin A1c is 7.1 on 11/19/24. Glucose 443 on admission.   Glucose stable on insulin  drip. Primary team to continue management.   4. Secondary Hyperparathyroidism: with outpatient labs:None available  Lab Results  Component Value Date   CALCIUM  7.1 (L) 11/21/2024   CAION 0.97 (L) 01/08/2023   PHOS 9.8 (H) 11/20/2024    Prescribed calcitriol , calcium  acetate and sevelamer  outpatient.  Calcium  and phosphorus not within optimal range. Will continue to hold binders due to GI symptoms. Phosphorus should improve with decreased oral intake. Will monitor for now.     LOS: 2 Koy Lamp 1/6/202612:14 PM   "

## 2024-11-21 NOTE — ED Notes (Signed)
 Requested lab to draw pt BMP.

## 2024-11-21 NOTE — ED Notes (Signed)
 Called lab for [facility] collection

## 2024-11-21 NOTE — ED Notes (Signed)
 Pt offered clear liquids but refuses saying it does not sound good. She does not feel able to tolerate at this time. Took a couple sips of ginger ale with her meds.

## 2024-11-21 NOTE — Progress Notes (Signed)
 " PROGRESS NOTE Emma Thomas    DOB: [birth year], 29 y.o.  FMW:[id number]    Code Status: Full Code   DOA: 11/19/2024   LOS: 2  Brief hospital course  Emma Thomas is a 29 y.o. female with a PMH significant for hypertension, hyperlipidemia, type 1 diabetes with history of DKA presenting to the ED with nausea/vomiting and failure to tolerate p.o. intake.  She reports having URI symptoms for 3 days. Last did HD on Thursday.   ED course: CBC unremarkable.  CMP with mild hyponatremia, severe acidosis with significant hyperglycemia and ESRD changes.  VBG showed bicarb 19.  BHB greater than 8.  Lactic acid normal.  Respiratory panel positive for influenza B.  Chest x-ray without any acute findings.  Patient started on insulin  drip.  Nephrology consulted for HD.   Patient was admitted to medicine service for further workup and management of influenza, DKA, ESRD missing HD as outlined in detail below.  11/21/2024 -refractory nausea and vomiting. Still unable to tolerate PO due to nausea and still anion gap.   Assessment & Plan  Principal Problem:   Diabetic ketoacidosis without coma associated with type 1 diabetes mellitus (HCC) Active Problems:   Uncontrolled type 1 diabetes mellitus with hyperglycemia, with long-term current use of insulin  (HCC)   End-stage renal disease on hemodialysis (HCC)   Hypertension   Mixed diabetic hyperlipidemia associated with type 1 diabetes mellitus (HCC)   Depression, major, recurrent, moderate (HCC)   Neuropathy   Hyponatremia  DKA, Type 1 DM-  - continue insulin  gtt and d5 fluids concurrently until anion gap closed and able to tolerate PO - monitor K+ and supplementation as needed to keep K>4 - trial of CLD.  - BMET every 4 hours while on drip - CBG Q1H while on drip - diabetes coordinator consulted - lower volume fluids given ESRD on HD. Continues on D5LR while on insulin  drip and sugars <250  Influenza B- stable ORA. Significant N/V.  - tamiflu   ordered - supportive care  Intractable N/V Emesis appeared dark brown yesterday. Concern for blood. Hgb has increased today. - hemoccult gastric testing ordered but has not had repeat vomiting to run test - consider GI consult and EGD for evaluation when stabilized - anti-emetics PRN - CBC am   Acute on chronic anemia- of chronic disease. Possible blood loss in emesis. Hgb has normalized today - no nsaids - CBC am    Hypertension: Home medicine is amlodipine  10 mg, Coreg  6.25 mg twice daily, and volume management via HD.  She has significantly elevated BP and appears she was unable to take her PO meds due to vomiting.  - added scheduled hydralazine  with significantly elevated pressures - PRN labetalol  with limits   ESRD: Gets home HD.  Last HD session was yesterday. Missed last outpatient session.   - Nephrology managing - next HD today - Continue home sevelamer .   Hyperlipidemia: Continue home Lipitor .   MDD/GAD: Continue home SSRI   Diabetic neuropathy: Continue home pregabalin  50 mg   Chronic pain syndrome: Continue oxycodone  5 mg.  Body mass index is 26.87 kg/m.  VTE ppx: heparin  injection 5,000 Units Start: 11/20/24 0200  Diet:     Diet   Diet NPO time specified   Consultants: Nephrology   Subjective 11/21/2024    Pt reports significant nausea still which is improved. No vomiting today. Does not feel that she could tolerate anything PO yet. She confirms that she got HD yesterday.   Objective  Blood pressure (!) 184/108, pulse 81, temperature (!) 97.4 F (36.3 C), temperature source Axillary, resp. rate 17, height 5' 6 (1.676 m), weight 78 kg, SpO2 95%.  Intake/Output Summary (Last 24 hours) at 11/21/2024 0749 Last data filed at 11/20/2024 1741 Gross per 24 hour  Intake 903.42 ml  Output 0 ml  Net 903.42 ml   Filed Weights   11/19/24 1346 11/20/24 1400 11/20/24 1741  Weight: 78 kg 75.5 kg 75.5 kg    Physical Exam:  General: awake, alert,ill  appearing Respiratory: normal respiratory effort. Cardiovascular: extremities well perfused, quick capillary refill, normal S1/S2, RRR, no JVD, murmurs Gastrointestinal: soft, NT, ND Nervous: A&O x3. no gross focal neurologic deficits, normal speech Extremities: moves all equally, no edema, normal tone Skin: dry, intact, normal temperature, normal color. No rashes, lesions or ulcers on exposed skin  Labs   I have personally reviewed the following labs and imaging studies CBC    Component Value Date/Time   WBC 10.7 (H) 11/21/2024 0013   RBC 4.49 11/21/2024 0013   HGB 12.4 11/21/2024 0013   HGB 8.7 (L) 10/25/2019 1022   HCT 38.8 11/21/2024 0013   HCT 26.0 (L) 10/25/2019 1022   PLT 282 11/21/2024 0013   PLT 358 10/25/2019 1022   MCV 86.4 11/21/2024 0013   MCV 89 10/25/2019 1022   MCH 27.6 11/21/2024 0013   MCHC 32.0 11/21/2024 0013   RDW 13.5 11/21/2024 0013   RDW 11.8 10/25/2019 1022   LYMPHSABS 1.5 09/04/2024 0300   MONOABS 0.6 09/04/2024 0300   EOSABS 0.4 09/04/2024 0300   BASOSABS 0.1 09/04/2024 0300      Latest Ref Rng & Units 11/21/2024    5:43 AM 11/21/2024   12:13 AM 11/20/2024    2:39 PM  BMP  Glucose 70 - 99 mg/dL 847  815  787   BUN 6 - 20 mg/dL 38  37  83   Creatinine 0.44 - 1.00 mg/dL 1.70  2.41  86.69   Sodium 135 - 145 mmol/L 134  134  139   Potassium 3.5 - 5.1 mmol/L 3.7  3.4  4.0   Chloride 98 - 111 mmol/L 92  91  93   CO2 22 - 32 mmol/L 25  22  18    Calcium  8.9 - 10.3 mg/dL 7.1  7.6  6.9    DG Chest Portable 1 View Result Date: 11/19/2024 CLINICAL DATA:  Cough, shortness of breath EXAM: PORTABLE CHEST 1 VIEW COMPARISON:  October 10, 2023 FINDINGS: The heart size and mediastinal contours are within normal limits. Both lungs are clear. The visualized skeletal structures are unremarkable. IMPRESSION: No active disease. Electronically Signed   By: Lynwood Landy Raddle M.D.   On: 11/19/2024 16:35   Disposition Plan & Communication  Patient status: Inpatient   Admitted From: Home Planned disposition location: Home Anticipated discharge date: TBD pending clinical course   Family Communication: none at bedside     Author: Marien LITTIE Piety, DO [facility] 11/21/2024, 7:49 AM   Available by Epic secure chat 7AM-7PM. If 7PM-7AM, please contact night-coverage.  [facility] contact information found on [email or web address].  "

## 2024-11-21 NOTE — Inpatient Diabetes Management (Signed)
 Inpatient Diabetes Program Recommendations  AACE/ADA: New Consensus Statement on Inpatient Glycemic Control   Target Ranges:  Prepandial:   less than 140 mg/dL      Peak postprandial:   less than 180 mg/dL (1-2 hours)      Critically ill patients:  140 - 180 mg/dL    Latest Reference Range & Units 11/21/24 00:20 11/21/24 01:33 11/21/24 03:15 11/21/24 05:10 11/21/24 06:36 11/21/24 07:29  Glucose-Capillary 70 - 99 mg/dL 820 (H) 823 (H) 847 (H) 167 (H) 150 (H) 175 (H)    Latest Reference Range & Units 11/20/24 13:54 11/20/24 14:56 11/20/24 16:22 11/20/24 17:31 11/20/24 18:30 11/20/24 19:39 11/20/24 20:48 11/20/24 21:59 11/20/24 23:13  Glucose-Capillary 70 - 99 mg/dL 800 (H) 840 (H) 843 (H) 124 (H) 182 (H) 235 (H) 270 (H) 227 (H) 193 (H)    Latest Reference Range & Units 11/19/24 13:48 11/19/24 19:00 11/19/24 23:58 11/20/24 04:49 11/20/24 09:47 11/20/24 14:39 11/21/24 00:13 11/21/24 05:43  CO2 22 - 32 mmol/L <7 (L) <7 (L) 16 (L) 20 (L) 18 (L) 18 (L) 22 25  Glucose 70 - 99 mg/dL 556 (H) 535 (H) 728 (H) 183 (H) 225 (H) 212 (H) 184 (H) 152 (H)  Anion gap 5 - 15  NOT CALCULATED NOT CALCULATED 35 (H) 27 (H) 29 (H) 28 (H) 22 (H) 18 (H)   Review of Glycemic Control  Diabetes history: DM1 Outpatient Diabetes medications: T-Slim pump with Humalog  insulin  Current orders for Inpatient glycemic control: IV insulin    Inpatient Diabetes Program Recommendations:     Insulin : At time of transition from IV to SQ insulin , please consider ordering insulin  glargine 14 units Q24H, CBGs Q4H, Novolog  0-6 units Q4H, and if eating Novolog  2 units TID with meals if patient eats at least 50% of meals.   NOTE: Patient admitted with DKA with initial lab glucose 443 mg/dl on 06/20/72 at 86:53. Per chart review, patient has DM1 and sees [facility]. Diabetes coordinator spoke with patient on 11/20/24 and she does not have any pump supplies here at the hospital.  Once patient is transitioned off IV insulin , she would  like to use SQ insulin  regimen while inpatient.  Thanks, Earnie Gainer, RN, MSN, CDCES Diabetes Coordinator Inpatient Diabetes Program [phone] (Team Pager from 8am to 5pm)

## 2024-11-21 NOTE — ED Notes (Signed)
 Per Dr Lenon, pt is to remain on insulin  gtt until anion gap is closer to closing and pt can tolerate PO. At this time, pt is unable to tolerate PO.

## 2024-11-22 ENCOUNTER — Ambulatory Visit: Admission: RE | Admit: 2024-11-22 | Payer: Self-pay | Source: Ambulatory Visit

## 2024-11-22 LAB — BASIC METABOLIC PANEL WITH GFR
Anion gap: 15 (ref 5–15)
BUN: 44 mg/dL — ABNORMAL HIGH (ref 6–20)
CO2: 25 mmol/L (ref 22–32)
Calcium: 6.8 mg/dL — ABNORMAL LOW (ref 8.9–10.3)
Chloride: 93 mmol/L — ABNORMAL LOW (ref 98–111)
Creatinine, Ser: 9.71 mg/dL — ABNORMAL HIGH (ref 0.44–1.00)
GFR, Estimated: 5 mL/min — ABNORMAL LOW
Glucose, Bld: 124 mg/dL — ABNORMAL HIGH (ref 70–99)
Potassium: 3.7 mmol/L (ref 3.5–5.1)
Sodium: 133 mmol/L — ABNORMAL LOW (ref 135–145)

## 2024-11-22 LAB — CBG MONITORING, ED
Glucose-Capillary: 131 mg/dL — ABNORMAL HIGH (ref 70–99)
Glucose-Capillary: 151 mg/dL — ABNORMAL HIGH (ref 70–99)
Glucose-Capillary: 154 mg/dL — ABNORMAL HIGH (ref 70–99)
Glucose-Capillary: 90 mg/dL (ref 70–99)
Glucose-Capillary: 92 mg/dL (ref 70–99)
Glucose-Capillary: 95 mg/dL (ref 70–99)

## 2024-11-22 MED ORDER — PENTAFLUOROPROP-TETRAFLUOROETH EX AERO: 1.0000 | INHALATION_SPRAY | CUTANEOUS | Status: DC | PRN

## 2024-11-22 MED ORDER — HEPARIN SODIUM (PORCINE) 1000 UNIT/ML DIALYSIS: 1000.0000 [IU] | INTRAMUSCULAR | Status: DC | PRN

## 2024-11-22 MED ORDER — INSULIN GLARGINE-YFGN 100 UNIT/ML ~~LOC~~ SOLN
14.0000 [IU] | Freq: Every day | SUBCUTANEOUS | Status: DC
  Filled 2024-11-22: qty 0.14

## 2024-11-22 MED ORDER — INSULIN ASPART 100 UNIT/ML IJ SOLN: 0.0000 [IU] | Freq: Three times a day (TID) | INTRAMUSCULAR | Status: DC

## 2024-11-22 MED ORDER — LIDOCAINE-PRILOCAINE 2.5-2.5 % EX CREA: 1.0000 | TOPICAL_CREAM | CUTANEOUS | Status: DC | PRN

## 2024-11-22 MED ORDER — INSULIN ASPART 100 UNIT/ML IJ SOLN: 2.0000 [IU] | Freq: Three times a day (TID) | INTRAMUSCULAR | Status: DC

## 2024-11-22 MED ORDER — LACTATED RINGERS IV SOLN: INTRAVENOUS | Status: DC

## 2024-11-22 MED ORDER — INSULIN ASPART 100 UNIT/ML IJ SOLN: 0.0000 [IU] | Freq: Every day | INTRAMUSCULAR | Status: DC

## 2024-11-22 NOTE — ED Notes (Signed)
 Pt provided meal box and diet soda

## 2024-11-22 NOTE — ED Notes (Addendum)
 At 0220, Gannon, NP and Cleatus HERO.D. were sent a secure chat stating Pt is DM1, admitted for DKA. Has been on Insulin  drip for 50+ hours. [facility] has had her insulin  drip turned off for the last 2 checks,, she did request some PO intake. She hasn't consumed much, but it is an improvement. I'm looking for BMP to result to see what her gap is, but she has expressed that I'm going to leave in the am. Pt has been stuck down here in a stretcher, not C pod appropriate r/t this drip. Can we transition her? Response from [facility] was lets see what her gap is .  As of this writing BMP has been in process for over 3 hours. I will have Joleen, paramedic contact them again.

## 2024-11-22 NOTE — Discharge Summary (Signed)
 " Physician Discharge Summary   Patient: Emma Thomas MRN: [id number] DOB: February 15, 1996  Admit date:     11/19/2024  Discharge date: 11/22/2024  Discharge Physician: Alban Pepper   PCP: [facility]   Recommendations at discharge:    F/u reg insulin  regimen   Discharge Diagnoses: Principal Problem:   Diabetic ketoacidosis without coma associated with type 1 diabetes mellitus (HCC) Active Problems:   Uncontrolled type 1 diabetes mellitus with hyperglycemia, with long-term current use of insulin  (HCC)   End-stage renal disease on hemodialysis (HCC)   Hypertension   Mixed diabetic hyperlipidemia associated with type 1 diabetes mellitus (HCC)   Depression, major, recurrent, moderate (HCC)   Neuropathy   Hyponatremia  Resolved Problems:   * No resolved hospital problems. *  Hospital Course: [name] is a 29 y.o. female with a PMH significant for hypertension, hyperlipidemia, type 1 diabetes with history of DKA presenting to the ED with nausea/vomiting and failure to tolerate p.o. intake.  She reports having URI symptoms for 3 days. Last did HD on Thursday.    ED course: CBC unremarkable.  CMP with mild hyponatremia, severe acidosis with significant hyperglycemia and ESRD changes.  VBG showed bicarb 19.  BHB greater than 8.  Lactic acid normal.  Respiratory panel positive for influenza B.  Chest x-ray without any acute findings.  Patient started on insulin  drip.  Nephrology consulted for HD.    Patient was admitted to medicine service for further workup and management of influenza, DKA, ESRD missing HD as outlined in detail below.   11/21/2024 -refractory nausea and vomiting. Still unable to tolerate PO due to nausea and still anion gap.   11/22/2024 N/V subsided patient insulin  drip stopped but did not receive subcutaneous insulin . Patient would like to leave against medical advice.   Assessment and Plan:  DKA Type 1 Diabetes mellitus c/b diabetic ulcer of L  foot, diabetic neuropathy and ESRD   Patient presented in DKA likely secondary to influenza infection. Insulin  drip was stopped 11/22/2024 AM but patient did not receive subcutaneous insulin  and did not wish to receive this prior to leaving AMA> Discussed with patient that she risks developing N/V, DKA, and up to death without having enough insulin . She understood the risks and still wished to be discharged from the hospital. She stated that she will resume her home insulin  pump and check her urine ketones.   N/V  Resolved. Likely in setting of DKA and flu infection.  Patient will follow up with her PCP.   Influenza B  Stable on room air. She received one dose of tamiflu .   ESRD  Patient refused HD 1/7. She will resume her home HD schedule.   HTN BP remained elevated.  She will resume her home BP meds as able. And continue HD.    Chronic medical problems:  Noted  HLD  MDD/GAD  Chornic pain syndrome           Pain control - [place]  Controlled Substance Reporting System database was reviewed. and patient was instructed, not to drive, operate heavy machinery, perform activities at heights, swimming or participation in water activities or provide baby-sitting services while on Pain, Sleep and Anxiety Medications; until their outpatient Physician has advised to do so again. Also recommended to not to take more than prescribed Pain, Sleep and Anxiety Medications.  Consultants: Nephrology  Procedures performed: NOne  Disposition: Leaving against medical advice.  Diet recommendation:  Carb modified diet DISCHARGE MEDICATION: Allergies as  of 11/22/2024       Reactions   Losartan  Swelling   Angioedema of tongue        Medication List     PAUSE taking these medications    citalopram  10 MG tablet Wait to take this until your doctor or other care provider tells you to start again. Commonly known as: CELEXA  Take 10 mg by mouth daily.       TAKE these medications     amLODipine  10 MG tablet Commonly known as: NORVASC  Take 1 tablet (10 mg total) by mouth daily.   atorvastatin  80 MG tablet Commonly known as: LIPITOR  Take 80 mg by mouth daily.   Baqsimi Two Pack 3 MG/DOSE Powd Generic drug: Glucagon Place 1 spray into both nostrils once.   bumetanide  1 MG tablet Commonly known as: BUMEX  Take 1 mg by mouth 2 (two) times daily.   calcitRIOL  0.25 MCG capsule Commonly known as: ROCALTROL  Take 0.25 mcg by mouth daily.   calcium  acetate 667 MG capsule Commonly known as: PHOSLO  Take 1,334 mg by mouth 3 (three) times daily.   carvedilol  6.25 MG tablet Commonly known as: COREG  Take 6.25 mg by mouth 2 (two) times daily.   HumaLOG  100 UNIT/ML injection Generic drug: insulin  lispro Inject 0-1 mLs (0-100 Units total) into the skin daily. Uses with Insulin  Pump   montelukast  10 MG tablet Commonly known as: SINGULAIR  Take 10 mg by mouth at bedtime.   norethindrone -ethinyl estradiol  1-20 MG-MCG tablet Commonly known as: LOESTRIN  Take 1 tablet by mouth daily.   oxyCODONE  5 MG immediate release tablet Commonly known as: Oxy IR/ROXICODONE  Take 1 tablet (5 mg total) by mouth every 12 (twelve) hours as needed for severe pain (pain score 7-10). Must last 30 days.   pregabalin  50 MG capsule Commonly known as: Lyrica  Take 1 capsule (50 mg total) by mouth 3 (three) times daily.   promethazine  12.5 MG tablet Commonly known as: PHENERGAN  Take 1 tablet (12.5 mg total) by mouth every 8 (eight) hours as needed for nausea or vomiting.   sertraline 50 MG tablet Commonly known as: ZOLOFT Take 50 mg by mouth daily.   sevelamer  carbonate 800 MG tablet Commonly known as: RENVELA  Take 800 mg by mouth 3 (three) times daily with meals.   SUMAtriptan  50 MG tablet Commonly known as: IMITREX  Take 50 mg by mouth daily as needed for migraine.               Discharge Care Instructions  (From admission, onward)           Start     Ordered    11/22/24 0000  Discharge wound care:       Comments: Bottom of Left foot, cleanse daily with soap and water.   11/22/24 0825            Discharge Exam: Filed Weights   11/19/24 1346 11/20/24 1400 11/20/24 1741  Weight: 78 kg 75.5 kg 75.5 kg   Physical Exam  Constitutional: In no distress.  Cardiovascular: Normal rate, regular rhythm. No lower extremity edema  Pulmonary: Non labored breathing on room air, no wheezing or rales.   Abdominal: Soft. Non distended and non tender Musculoskeletal: L plantar foot ulcer, no drainage  Neurological: Alert and oriented to person, place, and time. Non focal  Skin: Skin is warm and dry.    Condition at discharge: improving  The results of significant diagnostics from this hospitalization (including imaging, microbiology, ancillary and laboratory) are listed below for reference.  Imaging Studies: DG Chest Portable 1 View Result Date: 11/19/2024 CLINICAL DATA:  Cough, shortness of breath EXAM: PORTABLE CHEST 1 VIEW COMPARISON:  October 10, 2023 FINDINGS: The heart size and mediastinal contours are within normal limits. Both lungs are clear. The visualized skeletal structures are unremarkable. IMPRESSION: No active disease. Electronically Signed   By: Lynwood Landy Raddle M.D.   On: 11/19/2024 16:35    Microbiology: Results for orders placed or performed during the hospital encounter of 11/19/24  Resp panel by RT-PCR (RSV, Flu A&B, Covid) Anterior Nasal Swab     Status: Abnormal   Collection Time: 11/19/24  4:07 PM   Specimen: Anterior Nasal Swab  Result Value Ref Range Status   SARS Coronavirus 2 by RT PCR NEGATIVE NEGATIVE Final    Comment: (NOTE) SARS-CoV-2 target nucleic acids are NOT DETECTED.  The SARS-CoV-2 RNA is generally detectable in upper respiratory specimens during the acute phase of infection. The lowest concentration of SARS-CoV-2 viral copies this assay can detect is 138 copies/mL. A negative result does not preclude  SARS-Cov-2 infection and should not be used as the sole basis for treatment or other patient management decisions. A negative result may occur with  improper specimen collection/handling, submission of specimen other than nasopharyngeal swab, presence of viral mutation(s) within the areas targeted by this assay, and inadequate number of viral copies(<138 copies/mL). A negative result must be combined with clinical observations, patient history, and epidemiological information. The expected result is Negative.  Fact Sheet for Patients:  bloggercourse.com  Fact Sheet for Healthcare Providers:  seriousbroker.it  This test is no t yet approved or cleared by the [place]  FDA and  has been authorized for detection and/or diagnosis of SARS-CoV-2 by FDA under an Emergency Use Authorization (EUA). This EUA will remain  in effect (meaning this test can be used) for the duration of the COVID-19 declaration under Section 564(b)(1) of the Act, 21 U.S.C.section 360bbb-3(b)(1), unless the authorization is terminated  or revoked sooner.       Influenza A by PCR NEGATIVE NEGATIVE Final   Influenza B by PCR POSITIVE (A) NEGATIVE Final    Comment: (NOTE) The Xpert Xpress SARS-CoV-2/FLU/RSV plus assay is intended as an aid in the diagnosis of influenza from Nasopharyngeal swab specimens and should not be used as a sole basis for treatment. Nasal washings and aspirates are unacceptable for Xpert Xpress SARS-CoV-2/FLU/RSV testing.  Fact Sheet for Patients: bloggercourse.com  Fact Sheet for Healthcare Providers: seriousbroker.it  This test is not yet approved or cleared by the [place]  FDA and has been authorized for detection and/or diagnosis of SARS-CoV-2 by FDA under an Emergency Use Authorization (EUA). This EUA will remain in effect (meaning this test can be used) for the duration of  the COVID-19 declaration under Section 564(b)(1) of the Act, 21 U.S.C. section 360bbb-3(b)(1), unless the authorization is terminated or revoked.     Resp Syncytial Virus by PCR NEGATIVE NEGATIVE Final    Comment: (NOTE) Fact Sheet for Patients: bloggercourse.com  Fact Sheet for Healthcare Providers: seriousbroker.it  This test is not yet approved or cleared by the [place]  FDA and has been authorized for detection and/or diagnosis of SARS-CoV-2 by FDA under an Emergency Use Authorization (EUA). This EUA will remain in effect (meaning this test can be used) for the duration of the COVID-19 declaration under Section 564(b)(1) of the Act, 21 U.S.C. section 360bbb-3(b)(1), unless the authorization is terminated or revoked.  Performed at [facility], [address]  [place], [place] [address]     Labs: CBC: Recent Labs  Lab 11/19/24 1348 11/20/24 1439 11/21/24 0013  WBC 10.1 11.9* 10.7*  HGB 13.5 11.8* 12.4  HCT 43.8 36.2 38.8  MCV 89.9 87.0 86.4  PLT 347 269 282   Basic Metabolic Panel: Recent Labs  Lab 11/20/24 1439 11/21/24 0013 11/21/24 0543 11/21/24 1234 11/21/24 1705 11/22/24 0517  NA 139 134* 134* 132* 131* 133*  K 4.0 3.4* 3.7 3.7 3.9 3.7  CL 93* 91* 92* 91* 90* 93*  CO2 18* 22 25 25 23 25   GLUCOSE 212* 184* 152* 137* 205* 124*  BUN 83* 37* 38* 39* 41* 44*  CREATININE 13.30* 7.58* 8.29* 8.52* 8.76* 9.71*  CALCIUM  6.9* 7.6* 7.1* 7.2* 7.1* 6.8*  PHOS 9.8*  --   --   --   --   --    Liver Function Tests: Recent Labs  Lab 11/19/24 1348 11/20/24 1439  AST 20  --   ALT 8  --   ALKPHOS 99  --   BILITOT 0.4  --   PROT 9.2*  --   ALBUMIN 4.2 3.5   CBG: Recent Labs  Lab 11/22/24 0137 11/22/24 0244 11/22/24 0400 11/22/24 0500 11/22/24 0648  GLUCAP 95 151* 154* 131* 90    Discharge time spent: less than 30 minutes.  Signed: Alban Pepper, MD [facility] 11/22/2024 "

## 2024-11-22 NOTE — ED Notes (Signed)
 Patient refusing Dialysis @ this stating she wants to go home; MD messaged and paged @ this time

## 2024-11-22 NOTE — Progress Notes (Signed)
 Late entry: Pt refused Dialysis this morning. Made NP Druscilla and ED nurse aware pt decision. Pt stated she is leaving this morning and she can do dialysis at home.

## 2024-11-22 NOTE — ED Notes (Signed)
 MD @ bedside, Patient continues to want to leave; AMA explained by MD and RN; wound to left foot cleaned and wrapped; IV access removed and Patient DC to vehilcle by wheelchair with Significant Other

## 2024-11-26 NOTE — Progress Notes (Unsigned)
 PROVIDER NOTE: Interpretation of information contained herein should be left to medically-trained personnel. Specific patient instructions are provided elsewhere under Patient Instructions section of medical record. This document was created in part using AI and STT-dictation technology, any transcriptional errors that may result from this process are unintentional.  Patient: Emma Thomas  Service: E/M   PCP: [facility]  DOB: 08/17/1996  DOS: 11/27/2024  Provider: Emmy MARLA Blanch, NP  MRN: [id number]  Delivery: Face-to-face  Specialty: Interventional Pain Management  Type: Established Patient  Setting: Ambulatory outpatient facility  Specialty designation: 09  Referring Prov.: [place], [facility]  Location: Outpatient office facility       History of present illness (HPI) [name], a 29 y.o. year old female, is here today because of her left foot pain. [name] primary complain today is Foot Pain (Left Foot )  Pertinent problems: [name] has Diabetic ketoacidosis without coma associated with type 1 diabetes mellitus (HCC); Neuropathy; Osteomyelitis of left foot (HCC); CKD (chronic kidney disease) stage V requiring chronic dialysis (HCC); Chronic painful diabetic neuropathy (HCC); Neuropathic pain of foot, left; Neuropathy of foot, right; and Chronic pain of left knee on their pertinent problem list.  Pain Assessment: Severity of Chronic pain is reported as a 8 /10. Location: Foot Left/Denies. Onset: More than a month ago. Quality: Burning. Timing: Intermittent. Modifying factor(s): Pain medication, rest, elevating the foot. Vitals:  height is 5' 6 (1.676 m) and weight is 185 lb 3 oz (84 kg). Her temporal temperature is 97.5 F (36.4 C) (abnormal). Her blood pressure is 155/101 (abnormal) and her pulse is 101 (abnormal). Her respiration is 18 and oxygen saturation is 100%.  BMI: Estimated body mass index is 29.89 kg/m as calculated from the following:    Height as of this encounter: 5' 6 (1.676 m).   Weight as of this encounter: 185 lb 3 oz (84 kg).  Last encounter: 08/23/2024. Last procedure: Visit date not found.  Reason for encounter: medication management. No change in medical history since last visit.  Patient's pain is at baseline.  Patient continues multimodal pain regimen as prescribed.  States that it provides pain relief and improvement in functional status.   Discussed the use of AI scribe software for clinical note transcription with the patient, who gave verbal consent to proceed.  History of Present Illness   Emma Thomas is a 29 year old female with Charcot foot and diabetes who presents with left foot pain.  She experiences severe burning pain on both sides of her left foot, which intensifies with pressure, making ambulation difficult.  She has a history of diabetes. Her current medications include oxycodone  5 mg every twelve hours and Lyrica  (pregabalin ) three times a day. She has previously tried Qutenza  treatment, which did not provide significant relief. Lyrica  causes her body to 'jerk' but she denies dizziness. Oxycodone  causes constipation, for which she uses stool softeners and increases her water and fiber intake.     Pharmacotherapy Assessment   Oxycodone  (oxy IR/Roxicodone ) 5 mg immediate release every 12 hours as needed for pain Pregabalin  (Lyrica ) 50 mg capsule 3 times daily for neuropathic pain. Monitoring: [place] PMP: PDMP reviewed during this encounter.       Pharmacotherapy: No side-effects or adverse reactions reported. Compliance: No problems identified. Effectiveness: Clinically acceptable.  Emma Thomas, [place]  11/27/2024  2:32 PM  Sign when Signing Visit Nursing Pain Medication Assessment:  Safety precautions to be maintained throughout the outpatient  stay will include: orient to surroundings, keep bed in low position, maintain call bell within reach at all times, provide assistance with transfer out  of bed and ambulation.  Medication Inspection Compliance: Pill count conducted under aseptic conditions, in front of the patient. Neither the pills nor the bottle was removed from the patient's sight at any time. Once count was completed pills were immediately returned to the patient in their original bottle.  Medication: Oxycodone  IR Pill/Patch Count: 52 of 60 pills/patches remain Pill/Patch Appearance: Markings consistent with prescribed medication Bottle Appearance: Standard pharmacy container. Clearly labeled. Filled Date: 1 / 10 / 2026 Last Medication intake:  Yesterday    UDS:  Summary  Date Value Ref Range Status  03/07/2024 FINAL  Final    Comment:    ==================================================================== Compliance Drug Analysis, Ur ==================================================================== Specimen Alert Not Detected result may be consistent with the time of last use noted for this medication. AS NEEDED. (Codeine ) ==================================================================== Test                             Result       Flag       Units  Drug Present and Declared for Prescription Verification   Acetaminophen                   PRESENT      EXPECTED  Drug Present not Declared for Prescription Verification   Alpha-hydroxymidazolam         98           UNEXPECTED ng/mg creat    Alpha-hydroxymidazolam is an expected metabolite of midazolam .    Source of midazolam  is a scheduled prescription medication.    Noroxycodone                   233          UNEXPECTED ng/mg creat    Noroxycodone is an expected metabolite of oxycodone . Sources of    oxycodone  include scheduled prescription medications.    Diphenhydramine                 PRESENT      UNEXPECTED  Drug Absent but Declared for Prescription Verification   Codeine                         Not Detected UNEXPECTED ng/mg creat   Pregabalin                      Not Detected UNEXPECTED   Citalopram                       Not Detected UNEXPECTED   Promethazine                    Not Detected UNEXPECTED   Guaifenesin                     Not Detected UNEXPECTED ==================================================================== Test                      Result    Flag   Units      Ref Range   Creatinine              63               mg/dL      >=79 ==================================================================== Declared Medications:  The flagging  and interpretation on this report are based on the  following declared medications.  Unexpected results may arise from  inaccuracies in the declared medications.   **Note: The testing scope of this panel includes these medications:   Citalopram  (Celexa )  Codeine   Guaifenesin   Pregabalin  (Lyrica )  Promethazine  (Phenergan )   **Note: The testing scope of this panel does not include small to  moderate amounts of these reported medications:   Acetaminophen  (Tylenol )   **Note: The testing scope of this panel does not include the  following reported medications:   Albuterol  (Ventolin  HFA)  Amlodipine  (Norvasc )  Atorvastatin  (Lipitor )  Bumetanide  (Bumex )  Calcitriol  (Rocaltrol )  Carvedilol  (Coreg )  Cephalexin  (Keflex )  Ethinyl Estradiol   Insulin  (Humalog )  Montelukast  (Singulair )  Norethindrone   Sevelamer  (Renvela )  Sumatriptan  (Imitrex ) ==================================================================== For clinical consultation, please call [phone]. ====================================================================     No results found for: CBDTHCR No results found for: D8THCCBX No results found for: D9THCCBX  ROS  Constitutional: Denies any fever or chills Gastrointestinal: No reported hemesis, hematochezia, vomiting, or acute GI distress Musculoskeletal: Left foot pain Neurological: No reported episodes of acute onset apraxia, aphasia, dysarthria, agnosia, amnesia, paralysis, loss of coordination, or  loss of consciousness  Medication Review  Glucagon, SUMAtriptan , amLODipine , atorvastatin , bumetanide , calcitRIOL , calcium  acetate, carvedilol , citalopram , insulin  lispro, montelukast , norethindrone -ethinyl estradiol , oxyCODONE , pregabalin , promethazine , sertraline, and sevelamer  carbonate  History Review  Allergy: Ms. Gutmann is allergic to losartan . Drug: Ms. Sadowski  reports no history of drug use. Alcohol:  reports that she does not currently use alcohol after a past usage of about 2.0 standard drinks of alcohol per week. Tobacco:  reports that she has never smoked. She has been exposed to tobacco smoke. She has never used smokeless tobacco. Social: Ms. Brahmbhatt  reports that she has never smoked. She has been exposed to tobacco smoke. She has never used smokeless tobacco. She reports that she does not currently use alcohol after a past usage of about 2.0 standard drinks of alcohol per week. She reports that she does not use drugs. Medical:  has a past medical history of Anemia, Diabetes mellitus without complication (HCC), Essential hypertension, Headache, Hypertension (03/04/2013), Neurologic disorder, Neuromuscular disorder (HCC), Recurrent UTI, and Renal disorder. Surgical: Ms. Drum  has a past surgical history that includes abscess removal; Nexplanon (01/2011); DIALYSIS/PERMA CATHETER INSERTION (N/A, 04/24/2022); DIALYSIS/PERMA CATHETER REMOVAL (N/A, 06/02/2022); Insertion of dialysis catheter (Right); CAPD insertion (N/A, 06/09/2022); Umbilical hernia repair (N/A, 06/09/2022); CAPD revision (N/A, 07/16/2022); Amputation (Left, 11/23/2022); CAPD revision (N/A, 11/24/2022); IR Fluoro Guide CV Line Right (11/27/2022); IR US  Guide Vasc Access Right (11/27/2022); CAPD insertion (N/A, 12/15/2022); CAPD removal (N/A, 12/15/2022); DIALYSIS/PERMA CATHETER REMOVAL (N/A, 12/28/2022); CAPD removal (N/A, 01/08/2023); Irrigation and debridement foot (Left, 06/19/2023); Irrigation and debridement foot (Left, 07/09/2023); and  Amputation (Left, 07/09/2023). Family: family history includes Breast cancer (age of onset: 34) in her mother; Diabetes type II in her paternal grandmother; Lung cancer in her maternal grandmother.  Laboratory Chemistry Profile   Renal Lab Results  Component Value Date   BUN 44 (H) 11/22/2024   CREATININE 9.71 (H) 11/22/2024   LABCREA 58 02/13/2020   BCR 15 10/25/2019   GFRAA 55 (L) 02/13/2020   GFRNONAA 5 (L) 11/22/2024    Hepatic Lab Results  Component Value Date   AST 20 11/19/2024   ALT 8 11/19/2024   ALBUMIN 3.5 11/20/2024   ALKPHOS 99 11/19/2024   LIPASE 21 11/19/2024    Electrolytes Lab Results  Component Value Date   NA 133 (L) 11/22/2024  K 3.7 11/22/2024   CL 93 (L) 11/22/2024   CALCIUM  6.8 (L) 11/22/2024   MG 2.2 07/11/2023   PHOS 9.8 (H) 11/20/2024    Bone Lab Results  Component Value Date   VD25OH 20.67 (L) 06/19/2023    Inflammation (CRP: Acute Phase) (ESR: Chronic Phase) Lab Results  Component Value Date   CRP 12.9 (H) 06/18/2023   ESRSEDRATE 118 (H) 06/18/2023   LATICACIDVEN 1.4 11/19/2024         Note: Above Lab results reviewed.  Recent Imaging Review  DG Chest Portable 1 View CLINICAL DATA:  Cough, shortness of breath  EXAM: PORTABLE CHEST 1 VIEW  COMPARISON:  October 10, 2023  FINDINGS: The heart size and mediastinal contours are within normal limits. Both lungs are clear. The visualized skeletal structures are unremarkable.  IMPRESSION: No active disease.  Electronically Signed   By: Lynwood Landy Raddle M.D.   On: 11/19/2024 16:35 Note: Reviewed        Physical Exam  Vitals: BP (!) 155/101 (BP Location: Right Arm, Patient Position: Sitting, Cuff Size: Normal)   Pulse (!) 101   Temp (!) 97.5 F (36.4 C) (Temporal)   Resp 18   Ht 5' 6 (1.676 m)   Wt 185 lb 3 oz (84 kg)   SpO2 100%   BMI 29.89 kg/m  BMI: Estimated body mass index is 29.89 kg/m as calculated from the following:   Height as of this encounter: 5' 6  (1.676 m).   Weight as of this encounter: 185 lb 3 oz (84 kg). Ideal: Ideal body weight: 59.3 kg (130 lb 11.7 oz) Adjusted ideal body weight: 69.2 kg (152 lb 8.2 oz) General appearance: Well nourished, well developed, and well hydrated. In no apparent acute distress Mental status: Alert, oriented x 3 (person, place, & time)       Respiratory: No evidence of acute respiratory distress Eyes: PERLA  Musculoskeletal: Left foot pain Assessment   Diagnosis Status  1. Neuropathic pain of foot, left   2. Medication management   3. Chronic painful diabetic neuropathy (HCC)   4. Neuropathy of foot, right   5. Chronic pain of left knee   6. Chronic pain syndrome     Controlled Controlled Controlled   Updated Problems: No problems updated.   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 Previous Qutenza  ineffective. Managed with oxycodone  and pregabalin . - Continue boot for three months as per podiatrist. - Continue pregabalin  three times a day. - Manage constipation with increased water, fiber, and OTC stool softeners.  Chronic pain syndrome Managed with oxycodone  and pregabalin . Constipation from oxycodone , jerking from pregabalin . - Continue oxycodone  and pregabalin .   Medication management: Patient's pain is controlled with oxycodone , will continue on current medication regimen.  Prescribing drug monitoring (PDMP) reviewed, findings consistent with the use of prescribed medication and no evidence of narcotic misuse or abuse.  Urine drug screening (UDS) up to date.  The patient was advised to continue with over-the-counter stool softener or increase water intake and fiber consumption to prevent opioid-induced constipation.  No side effects or adverse reaction reported to medication for Lyrica .  Schedule follow-up in 90 days for medication  management.      Emma Thomas has a current medication list which includes the following long-term medication(s): amlodipine , atorvastatin , carvedilol , humalog , montelukast , norethindrone -ethinyl estradiol , promethazine , sertraline, [  Paused] citalopram , and pregabalin .  Pharmacotherapy (Medications Ordered): Meds ordered this encounter  Medications   oxyCODONE  (OXY IR/ROXICODONE ) 5 MG immediate release tablet    Sig: Take 1 tablet (5 mg total) by mouth every 12 (twelve) hours as needed for severe pain (pain score 7-10). Must last 30 days.    Dispense:  60 tablet    Refill:  0    Chronic Pain: STOP Act (Not applicable) Fill 1 day early if closed on refill date. Avoid benzodiazepines within 8 hours of opioids   oxyCODONE  (OXY IR/ROXICODONE ) 5 MG immediate release tablet    Sig: Take 1 tablet (5 mg total) by mouth every 12 (twelve) hours as needed for severe pain (pain score 7-10). Must last 30 days.    Dispense:  60 tablet    Refill:  0    Chronic Pain: STOP Act (Not applicable) Fill 1 day early if closed on refill date. Avoid benzodiazepines within 8 hours of opioids   oxyCODONE  (OXY IR/ROXICODONE ) 5 MG immediate release tablet    Sig: Take 1 tablet (5 mg total) by mouth every 12 (twelve) hours as needed for severe pain (pain score 7-10). Must last 30 days.    Dispense:  60 tablet    Refill:  0    Chronic Pain: STOP Act (Not applicable) Fill 1 day early if closed on refill date. Avoid benzodiazepines within 8 hours of opioids   pregabalin  (LYRICA ) 50 MG capsule    Sig: Take 1 capsule (50 mg total) by mouth 3 (three) times daily.    Dispense:  90 capsule    Refill:  2    Fill one day early if pharmacy is closed on scheduled refill date. May substitute for generic if available.   Orders:  No orders of the defined types were placed in this encounter.      Return in about 3 months (around 02/25/2025) for (F2F), (MM), Emmy Blanch NP.    Recent Visits No visits were found  meeting these conditions. Showing recent visits within past 90 days and meeting all other requirements Today's Visits Date Type Provider Dept  11/27/24 Office Visit Abagail Limb K, NP [facility]  Showing today's visits and meeting all other requirements Future Appointments Date Type Provider Dept  02/19/25 Appointment Cambry Spampinato K, NP [facility]  Showing future appointments within next 90 days and meeting all other requirements  I discussed the assessment and treatment plan with the patient. The patient was provided an opportunity to ask questions and all were answered. The patient agreed with the plan and demonstrated an understanding of the instructions.  Patient advised to call back or seek an in-person evaluation if the symptoms or condition worsens.  I personally spent a total of 30 minutes in the care of the patient today including preparing to see the patient, getting/reviewing separately obtained history, performing a medically appropriate exam/evaluation, counseling and educating, placing orders, referring and communicating with other health care professionals, documenting clinical information in the EHR, independently interpreting results, communicating results, and coordinating care.   Note by: Bunyan Brier K Almer Littleton, NP (TTS and AI technology used. I apologize for any typographical errors that were not detected and corrected.) Date: 11/27/2024; Time: 2:46 PM

## 2024-11-27 ENCOUNTER — Encounter: Payer: Self-pay | Admitting: Nurse Practitioner

## 2024-11-27 ENCOUNTER — Ambulatory Visit: Payer: Self-pay | Attending: Nurse Practitioner | Admitting: Nurse Practitioner

## 2024-11-27 VITALS — BP 155/101 | HR 101 | Temp 97.5°F | Resp 18 | Ht 66.0 in | Wt 185.2 lb

## 2024-11-27 DIAGNOSIS — G8929 Other chronic pain: Secondary | ICD-10-CM | POA: Insufficient documentation

## 2024-11-27 DIAGNOSIS — M792 Neuralgia and neuritis, unspecified: Secondary | ICD-10-CM | POA: Insufficient documentation

## 2024-11-27 DIAGNOSIS — E1140 Type 2 diabetes mellitus with diabetic neuropathy, unspecified: Secondary | ICD-10-CM | POA: Insufficient documentation

## 2024-11-27 DIAGNOSIS — G894 Chronic pain syndrome: Secondary | ICD-10-CM | POA: Insufficient documentation

## 2024-11-27 DIAGNOSIS — Z794 Long term (current) use of insulin: Secondary | ICD-10-CM

## 2024-11-27 DIAGNOSIS — M25562 Pain in left knee: Secondary | ICD-10-CM | POA: Insufficient documentation

## 2024-11-27 DIAGNOSIS — G5791 Unspecified mononeuropathy of right lower limb: Secondary | ICD-10-CM | POA: Insufficient documentation

## 2024-11-27 DIAGNOSIS — Z79899 Other long term (current) drug therapy: Secondary | ICD-10-CM | POA: Insufficient documentation

## 2024-11-27 MED ORDER — PREGABALIN 50 MG PO CAPS: 50.0000 mg | ORAL_CAPSULE | Freq: Three times a day (TID) | ORAL | 2 refills | Status: AC

## 2024-11-27 MED ORDER — OXYCODONE HCL 5 MG PO TABS: 5.0000 mg | ORAL_TABLET | Freq: Two times a day (BID) | ORAL | 0 refills | Status: DC | PRN

## 2024-11-27 MED ORDER — OXYCODONE HCL 5 MG PO TABS: 5.0000 mg | ORAL_TABLET | Freq: Two times a day (BID) | ORAL | 0 refills | Status: AC | PRN

## 2024-11-27 NOTE — Patient Instructions (Signed)

## 2024-11-27 NOTE — Progress Notes (Signed)
 Nursing Pain Medication Assessment:  Safety precautions to be maintained throughout the outpatient stay will include: orient to surroundings, keep bed in low position, maintain call bell within reach at all times, provide assistance with transfer out of bed and ambulation.  Medication Inspection Compliance: Pill count conducted under aseptic conditions, in front of the patient. Neither the pills nor the bottle was removed from the patient's sight at any time. Once count was completed pills were immediately returned to the patient in their original bottle.  Medication: Oxycodone  IR Pill/Patch Count: 52 of 60 pills/patches remain Pill/Patch Appearance: Markings consistent with prescribed medication Bottle Appearance: Standard pharmacy container. Clearly labeled. Filled Date: 1 / 10 / 2026 Last Medication intake:  Yesterday

## 2024-12-15 ENCOUNTER — Other Ambulatory Visit: Payer: Self-pay

## 2024-12-15 ENCOUNTER — Inpatient Hospital Stay
Admission: EM | Admit: 2024-12-15 | Discharge: 2024-12-18 | DRG: 637 | Disposition: A | Payer: Self-pay | Attending: Student | Admitting: Student

## 2024-12-15 DIAGNOSIS — G8929 Other chronic pain: Secondary | ICD-10-CM

## 2024-12-15 DIAGNOSIS — N2581 Secondary hyperparathyroidism of renal origin: Secondary | ICD-10-CM | POA: Diagnosis present

## 2024-12-15 DIAGNOSIS — I5032 Chronic diastolic (congestive) heart failure: Secondary | ICD-10-CM | POA: Diagnosis present

## 2024-12-15 DIAGNOSIS — E1022 Type 1 diabetes mellitus with diabetic chronic kidney disease: Secondary | ICD-10-CM | POA: Diagnosis present

## 2024-12-15 DIAGNOSIS — Z992 Dependence on renal dialysis: Secondary | ICD-10-CM

## 2024-12-15 DIAGNOSIS — Z833 Family history of diabetes mellitus: Secondary | ICD-10-CM

## 2024-12-15 DIAGNOSIS — I160 Hypertensive urgency: Secondary | ICD-10-CM | POA: Diagnosis present

## 2024-12-15 DIAGNOSIS — Z89412 Acquired absence of left great toe: Secondary | ICD-10-CM

## 2024-12-15 DIAGNOSIS — E1140 Type 2 diabetes mellitus with diabetic neuropathy, unspecified: Secondary | ICD-10-CM | POA: Diagnosis present

## 2024-12-15 DIAGNOSIS — G5791 Unspecified mononeuropathy of right lower limb: Secondary | ICD-10-CM

## 2024-12-15 DIAGNOSIS — Z79890 Hormone replacement therapy (postmenopausal): Secondary | ICD-10-CM

## 2024-12-15 DIAGNOSIS — M792 Neuralgia and neuritis, unspecified: Secondary | ICD-10-CM

## 2024-12-15 DIAGNOSIS — Z79899 Other long term (current) drug therapy: Secondary | ICD-10-CM

## 2024-12-15 DIAGNOSIS — D72829 Elevated white blood cell count, unspecified: Secondary | ICD-10-CM

## 2024-12-15 DIAGNOSIS — Z888 Allergy status to other drugs, medicaments and biological substances status: Secondary | ICD-10-CM

## 2024-12-15 DIAGNOSIS — F32A Depression, unspecified: Secondary | ICD-10-CM | POA: Diagnosis present

## 2024-12-15 DIAGNOSIS — Z794 Long term (current) use of insulin: Secondary | ICD-10-CM

## 2024-12-15 DIAGNOSIS — E875 Hyperkalemia: Secondary | ICD-10-CM | POA: Diagnosis present

## 2024-12-15 DIAGNOSIS — E1010 Type 1 diabetes mellitus with ketoacidosis without coma: Principal | ICD-10-CM | POA: Diagnosis present

## 2024-12-15 DIAGNOSIS — G894 Chronic pain syndrome: Secondary | ICD-10-CM | POA: Diagnosis present

## 2024-12-15 DIAGNOSIS — Z91158 Patient's noncompliance with renal dialysis for other reason: Secondary | ICD-10-CM

## 2024-12-15 DIAGNOSIS — M549 Dorsalgia, unspecified: Secondary | ICD-10-CM

## 2024-12-15 DIAGNOSIS — I132 Hypertensive heart and chronic kidney disease with heart failure and with stage 5 chronic kidney disease, or end stage renal disease: Secondary | ICD-10-CM | POA: Diagnosis present

## 2024-12-15 DIAGNOSIS — D649 Anemia, unspecified: Secondary | ICD-10-CM

## 2024-12-15 DIAGNOSIS — N186 End stage renal disease: Secondary | ICD-10-CM | POA: Diagnosis present

## 2024-12-15 DIAGNOSIS — D638 Anemia in other chronic diseases classified elsewhere: Secondary | ICD-10-CM | POA: Diagnosis present

## 2024-12-15 DIAGNOSIS — E1042 Type 1 diabetes mellitus with diabetic polyneuropathy: Secondary | ICD-10-CM | POA: Diagnosis present

## 2024-12-15 DIAGNOSIS — Z89422 Acquired absence of other left toe(s): Secondary | ICD-10-CM

## 2024-12-15 DIAGNOSIS — D631 Anemia in chronic kidney disease: Secondary | ICD-10-CM | POA: Diagnosis present

## 2024-12-15 DIAGNOSIS — R1110 Vomiting, unspecified: Secondary | ICD-10-CM | POA: Diagnosis present

## 2024-12-15 DIAGNOSIS — E559 Vitamin D deficiency, unspecified: Secondary | ICD-10-CM | POA: Diagnosis present

## 2024-12-15 LAB — COMPREHENSIVE METABOLIC PANEL WITH GFR
ALT: 8 U/L (ref 0–44)
AST: 14 U/L — ABNORMAL LOW (ref 15–41)
Albumin: 4 g/dL (ref 3.5–5.0)
Alkaline Phosphatase: 86 U/L (ref 38–126)
Anion gap: 30 — ABNORMAL HIGH (ref 5–15)
BUN: 79 mg/dL — ABNORMAL HIGH (ref 6–20)
CO2: 12 mmol/L — ABNORMAL LOW (ref 22–32)
Calcium: 8.1 mg/dL — ABNORMAL LOW (ref 8.9–10.3)
Chloride: 93 mmol/L — ABNORMAL LOW (ref 98–111)
Creatinine, Ser: 13.3 mg/dL — ABNORMAL HIGH (ref 0.44–1.00)
GFR, Estimated: 3 mL/min — ABNORMAL LOW
Glucose, Bld: 219 mg/dL — ABNORMAL HIGH (ref 70–99)
Potassium: 5.8 mmol/L — ABNORMAL HIGH (ref 3.5–5.1)
Sodium: 135 mmol/L (ref 135–145)
Total Bilirubin: 0.5 mg/dL (ref 0.0–1.2)
Total Protein: 8.6 g/dL — ABNORMAL HIGH (ref 6.5–8.1)

## 2024-12-15 LAB — CBC WITH DIFFERENTIAL/PLATELET
Abs Immature Granulocytes: 0.06 10*3/uL (ref 0.00–0.07)
Basophils Absolute: 0 10*3/uL (ref 0.0–0.1)
Basophils Relative: 0 %
Eosinophils Absolute: 0 10*3/uL (ref 0.0–0.5)
Eosinophils Relative: 0 %
HCT: 30.5 % — ABNORMAL LOW (ref 36.0–46.0)
Hemoglobin: 9.8 g/dL — ABNORMAL LOW (ref 12.0–15.0)
Immature Granulocytes: 1 %
Lymphocytes Relative: 10 %
Lymphs Abs: 1 10*3/uL (ref 0.7–4.0)
MCH: 27.5 pg (ref 26.0–34.0)
MCHC: 32.1 g/dL (ref 30.0–36.0)
MCV: 85.4 fL (ref 80.0–100.0)
Monocytes Absolute: 0.5 10*3/uL (ref 0.1–1.0)
Monocytes Relative: 4 %
Neutro Abs: 9.1 10*3/uL — ABNORMAL HIGH (ref 1.7–7.7)
Neutrophils Relative %: 85 %
Platelets: 359 10*3/uL (ref 150–400)
RBC: 3.57 MIL/uL — ABNORMAL LOW (ref 3.87–5.11)
RDW: 13.4 % (ref 11.5–15.5)
WBC: 10.7 10*3/uL — ABNORMAL HIGH (ref 4.0–10.5)
nRBC: 0 % (ref 0.0–0.2)

## 2024-12-15 LAB — BLOOD GAS, VENOUS
Acid-base deficit: 11.4 mmol/L — ABNORMAL HIGH (ref 0.0–2.0)
Bicarbonate: 12.1 mmol/L — ABNORMAL LOW (ref 20.0–28.0)
O2 Saturation: 76.2 %
Patient temperature: 37
pCO2, Ven: 22 mmHg — ABNORMAL LOW (ref 44–60)
pH, Ven: 7.35 (ref 7.25–7.43)
pO2, Ven: 46 mmHg — ABNORMAL HIGH (ref 32–45)

## 2024-12-15 LAB — CBG MONITORING, ED
Glucose-Capillary: 261 mg/dL — ABNORMAL HIGH (ref 70–99)
Glucose-Capillary: 233 mg/dL — ABNORMAL HIGH (ref 70–99)
Glucose-Capillary: 200 mg/dL — ABNORMAL HIGH (ref 70–99)

## 2024-12-15 LAB — LIPASE, BLOOD: Lipase: <10 U/L — ABNORMAL LOW (ref 11–51)

## 2024-12-15 LAB — BETA-HYDROXYBUTYRIC ACID: Beta-Hydroxybutyric Acid: 3.11 mmol/L — ABNORMAL HIGH (ref 0.05–0.27)

## 2024-12-15 LAB — MAGNESIUM: Magnesium: 2.6 mg/dL — ABNORMAL HIGH (ref 1.7–2.4)

## 2024-12-15 MED ORDER — PROCHLORPERAZINE EDISYLATE 10 MG/2ML IJ SOLN
10.0000 mg | INTRAMUSCULAR | Status: DC | PRN
  Administered 2024-12-15 – 2024-12-16 (×2): 10 mg via INTRAVENOUS
  Filled 2024-12-15 (×2): qty 2

## 2024-12-15 MED ORDER — HEPARIN SODIUM (PORCINE) 5000 UNIT/ML IJ SOLN
5000.0000 [IU] | Freq: Three times a day (TID) | INTRAMUSCULAR | Status: DC
  Administered 2024-12-15 – 2024-12-18 (×7): 5000 [IU] via SUBCUTANEOUS
  Filled 2024-12-15 (×10): qty 1

## 2024-12-15 MED ORDER — LACTATED RINGERS IV BOLUS
1000.0000 mL | Freq: Once | INTRAVENOUS | Status: AC
  Administered 2024-12-15: 1000 mL via INTRAVENOUS

## 2024-12-15 MED ORDER — INSULIN REGULAR(HUMAN) IN NACL 100-0.9 UT/100ML-% IV SOLN
INTRAVENOUS | Status: DC
  Administered 2024-12-15: 5.5 [IU]/h via INTRAVENOUS
  Filled 2024-12-15 (×2): qty 100

## 2024-12-15 MED ORDER — NORETHINDRONE ACET-ETHINYL EST 1-20 MG-MCG PO TABS: 1.0000 | ORAL_TABLET | Freq: Every day | ORAL | Status: DC

## 2024-12-15 MED ORDER — SODIUM ZIRCONIUM CYCLOSILICATE 10 G PO PACK
10.0000 g | PACK | Freq: Once | ORAL | Status: DC
  Filled 2024-12-15: qty 1

## 2024-12-15 MED ORDER — ACETAMINOPHEN 325 MG PO TABS: 650.0000 mg | ORAL_TABLET | Freq: Four times a day (QID) | ORAL | Status: DC | PRN

## 2024-12-15 MED ORDER — MONTELUKAST SODIUM 10 MG PO TABS
10.0000 mg | ORAL_TABLET | Freq: Every day | ORAL | Status: DC
  Administered 2024-12-16 – 2024-12-17 (×2): 10 mg via ORAL
  Filled 2024-12-15 (×2): qty 1

## 2024-12-15 MED ORDER — ACETAMINOPHEN 650 MG RE SUPP: 650.0000 mg | Freq: Four times a day (QID) | RECTAL | Status: DC | PRN

## 2024-12-15 MED ORDER — DEXTROSE 50 % IV SOLN: 0.0000 mL | INTRAVENOUS | Status: DC | PRN

## 2024-12-15 MED ORDER — OXYCODONE HCL 5 MG PO TABS
5.0000 mg | ORAL_TABLET | ORAL | Status: DC | PRN
  Administered 2024-12-16 – 2024-12-18 (×2): 5 mg via ORAL
  Filled 2024-12-15 (×2): qty 1

## 2024-12-15 MED ORDER — PREGABALIN 50 MG PO CAPS
50.0000 mg | ORAL_CAPSULE | Freq: Three times a day (TID) | ORAL | Status: DC
  Administered 2024-12-16 – 2024-12-18 (×3): 50 mg via ORAL
  Filled 2024-12-15 (×4): qty 1

## 2024-12-15 MED ORDER — AMLODIPINE BESYLATE 5 MG PO TABS
10.0000 mg | ORAL_TABLET | Freq: Every day | ORAL | Status: DC
  Administered 2024-12-18: 10 mg via ORAL
  Filled 2024-12-15 (×2): qty 2

## 2024-12-15 MED ORDER — DEXTROSE IN LACTATED RINGERS 5 % IV SOLN: INTRAVENOUS | Status: AC

## 2024-12-15 MED ORDER — SERTRALINE HCL 50 MG PO TABS
50.0000 mg | ORAL_TABLET | Freq: Every day | ORAL | Status: DC
  Administered 2024-12-18: 50 mg via ORAL
  Filled 2024-12-15 (×2): qty 1

## 2024-12-15 MED ORDER — PANTOPRAZOLE SODIUM 40 MG IV SOLR
40.0000 mg | INTRAVENOUS | Status: DC
  Administered 2024-12-15: 40 mg via INTRAVENOUS
  Filled 2024-12-15: qty 10

## 2024-12-15 MED ORDER — CARVEDILOL 6.25 MG PO TABS
6.2500 mg | ORAL_TABLET | Freq: Two times a day (BID) | ORAL | Status: DC
  Administered 2024-12-16 – 2024-12-18 (×3): 6.25 mg via ORAL
  Filled 2024-12-15 (×3): qty 1

## 2024-12-15 MED ORDER — HYDROMORPHONE HCL 1 MG/ML IJ SOLN
0.5000 mg | INTRAMUSCULAR | Status: DC | PRN
  Administered 2024-12-16 – 2024-12-18 (×12): 1 mg via INTRAVENOUS
  Filled 2024-12-15 (×13): qty 1

## 2024-12-15 MED ORDER — ONDANSETRON HCL 4 MG/2ML IJ SOLN
4.0000 mg | Freq: Once | INTRAMUSCULAR | Status: AC
  Administered 2024-12-15: 4 mg via INTRAVENOUS
  Filled 2024-12-15: qty 2

## 2024-12-15 MED ORDER — CALCIUM ACETATE (PHOS BINDER) 667 MG PO CAPS
1334.0000 mg | ORAL_CAPSULE | Freq: Three times a day (TID) | ORAL | Status: DC
  Administered 2024-12-16 – 2024-12-18 (×3): 1334 mg via ORAL
  Filled 2024-12-15 (×9): qty 2

## 2024-12-15 MED ORDER — CALCIUM GLUCONATE 10 % IV SOLN
1.0000 g | Freq: Once | INTRAVENOUS | Status: AC
  Administered 2024-12-15: 1 g via INTRAVENOUS
  Filled 2024-12-15: qty 10

## 2024-12-15 MED ORDER — HYDROMORPHONE HCL 1 MG/ML IJ SOLN
1.0000 mg | Freq: Once | INTRAMUSCULAR | Status: AC
  Administered 2024-12-15: 1 mg via INTRAVENOUS
  Filled 2024-12-15: qty 1

## 2024-12-15 MED ORDER — LACTATED RINGERS IV SOLN: INTRAVENOUS | Status: AC

## 2024-12-15 MED ORDER — ATORVASTATIN CALCIUM 20 MG PO TABS
80.0000 mg | ORAL_TABLET | Freq: Every day | ORAL | Status: DC
  Administered 2024-12-18: 80 mg via ORAL
  Filled 2024-12-15 (×2): qty 4

## 2024-12-15 NOTE — Assessment & Plan Note (Addendum)
 Leukocytosis / SIRS Presents with several hour history of intractable vomiting associated with back pain WBC 11.7.  Lipase and LFTs WNL Suspect all related to DKA and expecting resolution with treatment of DKA Hydration, IV antiemetics and IV Protonix  Will get urinalysis to evaluate for UTI

## 2024-12-15 NOTE — Assessment & Plan Note (Addendum)
 Chronic pain syndrome on chronic opiates Depression Continue oxycodone , pregabalin  and sertraline  Received a dose of Dilaudid  in the ED-Will continue as needed if not tolerating orally

## 2024-12-15 NOTE — ED Triage Notes (Signed)
 BIBEMS from home. Pt c/o of N/V that has been going on since this morning. Hx of type 1 diabetes and dialysis. Last dialysis treatment was past Sunday. Also hx of HTN.   EMS VS: 261 CBG 181/108 BP 97% on RA 107 HR

## 2024-12-15 NOTE — Assessment & Plan Note (Signed)
 BP 177/108, If able to tolerate, continue home amlodipine  and carvedilol  Hydralazine  IV as needed for additional control

## 2024-12-15 NOTE — Assessment & Plan Note (Addendum)
 Baseline anemia of chronic kidney disease Hemoglobin 9.8, down from 12.4 just 3 weeks prior Noted hematuria on UA from 3 weeks prior on 1/6 No reports of coffee-ground or bloody emesis, or black or bloody stool Will get serial H&H Given low back pain, will get CT abdomen and pelvis

## 2024-12-15 NOTE — Assessment & Plan Note (Signed)
 Presents with several hour onset of intractable vomiting Diabetes generally well-controlled with A1c of 7.1 about 3 weeks ago Glucose 219 with anion gap of 30, bicarb 12, venous pH 7.35 and beta hydroxybutyric acid 3.11 IV fluids, IV insulin  and electrolyte monitoring with potassium repletion per Endo tool Transition to subcu when gap is closed and less acidotic

## 2024-12-15 NOTE — Hospital Course (Signed)
 SABRA

## 2024-12-15 NOTE — Assessment & Plan Note (Signed)
 Expecting improvement as DKA is treated Received calcium  gluconate and Lokelma  in the ED, due to concerns for peaked T waves on EKG Monitor for hypokalemia

## 2024-12-15 NOTE — Assessment & Plan Note (Signed)
 Chronic home hemodialysis Patient dialyzes 3 times a week-no specific days, last session was Sunday 1/25 Nephrology consulted for continuation of dialysis

## 2024-12-15 NOTE — Assessment & Plan Note (Signed)
 SIRS WBC 11.7, tachycardic to 102 Suspect secondary to DKA rather than acute infection Ordered urinalysis-not yet done Also having back pain low back, not costovertebral

## 2024-12-15 NOTE — Assessment & Plan Note (Addendum)
 Clinically euvolemic to dry Monitor for fluid overload given IV fluids for the treatment of DKA Daily weights

## 2024-12-15 NOTE — H&P (Incomplete)
 " History and Physical    Patient: Emma Thomas FMW:[id number] DOB: 1996/03/17 DOA: 12/15/2024 DOS: the patient was seen and examined on 12/15/2024 PCP: [facility]  Patient coming from: Home  Chief Complaint:  Chief Complaint  Patient presents with   Nausea   Emesis    HPI: Emma Thomas is a 29 y.o. female with medical history significant for ESRD on home hemodialysis 3 times a week, chronic anemia , HTN, type 1 diabetes with painful polyneuropathy, diastolic CHF, being admitted in DKA.  She presented with a several hour  history of intractable nonbloody, nonbilious vomiting associated with back pain due to retching.  No significant abdominal pain denies denies fever, cough, change in bowel habits. On arrival, hypertensive to 177/108, tachycardic at 104 and tachypneic at 22, afebrile. Labs notable for the following: - Glucose 219 with anion gap of 30, bicarb 12, venous pH 7.35 and beta hydroxybutyric acid 3.11 - Lipase less than 10 and normal LFTs - WBC 10.7 - Hemoglobin 9.8, down from 12.4 just 3 weeks prior - Potassium 5.8 and magnesium  2.6  EKG, personally interpreted showing sinus tachycardia at 102 with mildly prolonged QT interval of 552  No imaging done  Patient was treated with:  -LR bolus and started on insulin  infusion per Endo tool -Calcium  gluconate and Lokelma  for hyperkalemia, due to concerns for peaked T waves on EKG -Dilaudid  for back pain  Admission requested     Past Medical History:  Diagnosis Date   Anemia    Diabetes mellitus without complication (HCC)    Type 1 DM   Essential hypertension    Headache    Hypertension 03/04/2013   Neurologic disorder    Both feet   Neuromuscular disorder (HCC)    Recurrent UTI    Renal disorder    Past Surgical History:  Procedure Laterality Date   abscess removal     excision of bartholin cyst   AMPUTATION Left 11/23/2022   Procedure: LEFT GREAT TOE PARTIAL RAY AMPUTATION;  Surgeon:  Neill Boas, DPM;  Location: [facility];  Service: Podiatry;  Laterality: Left;   AMPUTATION Left 07/09/2023   Procedure: AMPUTATION First Ray;  Surgeon: Lennie Barter, DPM;  Location: [facility];  Service: Orthopedics/Podiatry;  Laterality: Left;   CAPD INSERTION N/A 06/09/2022   Procedure: LAPAROSCOPIC INSERTION CONTINUOUS AMBULATORY PERITONEAL DIALYSIS  (CAPD) CATHETER, PD rep to be present;  Surgeon: Desiderio Schanz, MD;  Location: [facility];  Service: General;  Laterality: N/A;   CAPD INSERTION N/A 12/15/2022   Procedure: LAPAROSCOPIC INSERTION CONTINUOUS AMBULATORY PERITONEAL DIALYSIS  (CAPD) CATHETER;  Surgeon: Desiderio Schanz, MD;  Location: [facility];  Service: General;  Laterality: N/A;   CAPD REMOVAL N/A 12/15/2022   Procedure: LAPAROSCOPIC REMOVAL CONTINUOUS AMBULATORY PERITONEAL DIALYSIS  (CAPD) CATHETER, removal of old catheter;  Surgeon: Desiderio Schanz, MD;  Location: [facility];  Service: General;  Laterality: N/A;   CAPD REMOVAL N/A 01/08/2023   Procedure: LAPAROSCOPIC REMOVAL CONTINUOUS AMBULATORY PERITONEAL DIALYSIS  (CAPD) CATHETER;  Surgeon: Desiderio Schanz, MD;  Location: [facility];  Service: General;  Laterality: N/A;   CAPD REVISION N/A 07/16/2022   Procedure: LAPAROSCOPIC REVISION CONTINUOUS AMBULATORY PERITONEAL DIALYSIS  (CAPD) CATHETER;  Surgeon: Desiderio Schanz, MD;  Location: [facility];  Service: General;  Laterality: N/A;   CAPD REVISION N/A 11/24/2022   Procedure: LAPAROSCOPIC REVISION CONTINUOUS AMBULATORY PERITONEAL DIALYSIS  (CAPD) CATHETER;  Surgeon: Desiderio Schanz, MD;  Location: [facility];  Service: General;  Laterality: N/A;   DIALYSIS/PERMA CATHETER  INSERTION N/A 04/24/2022   Procedure: DIALYSIS/PERMA CATHETER INSERTION;  Surgeon: Jama Cordella MATSU, MD;  Location: [facility];  Service: Cardiovascular;  Laterality: N/A;   DIALYSIS/PERMA CATHETER REMOVAL N/A 06/02/2022   Procedure: DIALYSIS/PERMA CATHETER REMOVAL;  Surgeon: Jama Cordella MATSU, MD;  Location: [facility];  Service: Cardiovascular;  Laterality: N/A;   DIALYSIS/PERMA CATHETER REMOVAL N/A 12/28/2022   Procedure: DIALYSIS/PERMA CATHETER REMOVAL;  Surgeon: Marea Selinda RAMAN, MD;  Location: [facility];  Service: Cardiovascular;  Laterality: N/A;   INSERTION OF DIALYSIS CATHETER Right    ARM   IR FLUORO GUIDE CV LINE RIGHT  11/27/2022   IR US  GUIDE VASC ACCESS RIGHT  11/27/2022   IRRIGATION AND DEBRIDEMENT FOOT Left 06/19/2023   Procedure: IRRIGATION AND DEBRIDEMENT FOOT PARTIAL 1ST RAY AMPUTATION;  Surgeon: Lennie Barter, DPM;  Location: [facility];  Service: Orthopedics/Podiatry;  Laterality: Left;   IRRIGATION AND DEBRIDEMENT FOOT Left 07/09/2023   Procedure: IRRIGATION AND DEBRIDEMENT FOOT;  Surgeon: Lennie Barter, DPM;  Location: [facility];  Service: Orthopedics/Podiatry;  Laterality: Left;   Nexplanon  01/2011   UMBILICAL HERNIA REPAIR N/A 06/09/2022   Procedure: HERNIA REPAIR UMBILICAL ADULT;  Surgeon: Desiderio Schanz, MD;  Location: [facility];  Service: General;  Laterality: N/A;   Social History:  reports that she has never smoked. She has been exposed to tobacco smoke. She has never used smokeless tobacco. She reports that she does not currently use alcohol after a past usage of about 2.0 standard drinks of alcohol per week. She reports that she does not use drugs.  Allergies[1]  Family History  Problem Relation Age of Onset   Breast cancer Mother 13   Lung cancer Maternal Grandmother    Diabetes type II Paternal Grandmother     Prior to Admission medications  Medication Sig Start Date End Date Taking? Authorizing Provider  amLODipine  (NORVASC ) 10 MG tablet Take 1 tablet (10 mg total) by mouth daily. 04/28/22   Jens Durand, MD  atorvastatin  (LIPITOR ) 80 MG tablet Take 80 mg by mouth daily.    [provider]  BAQSIMI TWO PACK 3 MG/DOSE POWD Place 1 spray into both nostrils once. 09/23/24   [provider]  calcitRIOL  (ROCALTROL ) 0.25 MCG capsule Take 0.25 mcg by mouth  daily. 09/22/23   [provider]  calcium  acetate (PHOSLO ) 667 MG capsule Take 1,334 mg by mouth 3 (three) times daily. 09/02/24   [provider]  carvedilol  (COREG ) 6.25 MG tablet Take 6.25 mg by mouth 2 (two) times daily. 10/05/23   [provider]  [Paused] citalopram  (CELEXA ) 10 MG tablet Take 10 mg by mouth daily. Patient not taking: Reported on 11/27/2024 Wait to take this until your doctor or other care provider tells you to start again. 09/20/23   [provider]  HUMALOG  100 UNIT/ML injection Inject 0-1 mLs (0-100 Units total) into the skin daily. Uses with Insulin  Pump 04/09/23   Cherlyn Labella, MD  montelukast  (SINGULAIR ) 10 MG tablet Take 10 mg by mouth at bedtime.    [provider]  norethindrone -ethinyl estradiol  (LOESTRIN ) 1-20 MG-MCG tablet Take 1 tablet by mouth daily.    [provider]  oxyCODONE  (OXY IR/ROXICODONE ) 5 MG immediate release tablet Take 1 tablet (5 mg total) by mouth every 12 (twelve) hours as needed for severe pain (pain score 7-10). Must last 30 days. 12/25/24 01/24/25  Patel, Seema K, NP  oxyCODONE  (OXY IR/ROXICODONE ) 5 MG immediate release tablet Take 1 tablet (5  mg total) by mouth every 12 (twelve) hours as needed for severe pain (pain score 7-10). Must last 30 days. 01/24/25 02/23/25  Patel, Seema K, NP  oxyCODONE  (OXY IR/ROXICODONE ) 5 MG immediate release tablet Take 1 tablet (5 mg total) by mouth every 12 (twelve) hours as needed for severe pain (pain score 7-10). Must last 30 days. 02/23/25 03/25/25  Patel, Seema K, NP  pregabalin  (LYRICA ) 50 MG capsule Take 1 capsule (50 mg total) by mouth 3 (three) times daily. 11/27/24   Patel, Seema K, NP  promethazine  (PHENERGAN ) 12.5 MG tablet Take 1 tablet (12.5 mg total) by mouth every 8 (eight) hours as needed for nausea or vomiting. 10/12/23   Alexander, Natalie, DO  sertraline  (ZOLOFT ) 50 MG tablet Take 50 mg by mouth daily. 09/23/24   [provider]  SUMAtriptan   (IMITREX ) 50 MG tablet Take 50 mg by mouth daily as needed for migraine.    [provider]    Physical Exam: Vitals:   12/15/24 1842 12/15/24 1846 12/15/24 1957 12/15/24 2000  BP: (!) 177/108  (!) 169/102 (!) 164/100  Pulse: (!) 104  (!) 104 (!) 101  Resp: (!) 22  (!) 32 (!) 25  Temp: 98.8 F (37.1 C)     TempSrc: Oral     SpO2: 98%  98% 94%  Weight:  80 kg    Height:  5' 6 (1.676 m)     Physical Exam Vitals and nursing note reviewed.  Constitutional:      General: She is not in acute distress.    Appearance: She is ill-appearing.     Comments: Chronically ill-appearing  HENT:     Head: Normocephalic and atraumatic.  Cardiovascular:     Rate and Rhythm: Regular rhythm. Tachycardia present.     Heart sounds: Normal heart sounds.  Pulmonary:     Effort: Tachypnea present.     Breath sounds: Normal breath sounds.  Abdominal:     Palpations: Abdomen is soft.     Tenderness: There is no abdominal tenderness.  Musculoskeletal:     Right lower leg: Edema present.     Left lower leg: Edema present.  Neurological:     Mental Status: Mental status is at baseline.     Labs on Admission: I have personally reviewed following labs and imaging studies  CBC: Recent Labs  Lab 12/15/24 1849  WBC 10.7*  NEUTROABS 9.1*  HGB 9.8*  HCT 30.5*  MCV 85.4  PLT 359   Basic Metabolic Panel: Recent Labs  Lab 12/15/24 1849  NA 135  K 5.8*  CL 93*  CO2 12*  GLUCOSE 219*  BUN 79*  CREATININE 13.30*  CALCIUM  8.1*  MG 2.6*   GFR: Estimated Creatinine Clearance: 6.6 mL/min (A) (by C-G formula based on SCr of 13.3 mg/dL (H)). Liver Function Tests: Recent Labs  Lab 12/15/24 1849  AST 14*  ALT 8  ALKPHOS 86  BILITOT 0.5  PROT 8.6*  ALBUMIN 4.0   Recent Labs  Lab 12/15/24 1849  LIPASE <10*   No results for input(s): AMMONIA in the last 168 hours. Coagulation Profile: No results for input(s): INR, PROTIME in the last 168 hours. Cardiac Enzymes: No  results for input(s): CKTOTAL, CKMB, CKMBINDEX, TROPONINI in the last 168 hours. BNP (last 3 results) No results for input(s): PROBNP in the last 8760 hours. HbA1C: No results for input(s): HGBA1C in the last 72 hours. CBG: No results for input(s): GLUCAP in the last 168 hours. Lipid Profile: No  results for input(s): CHOL, HDL, LDLCALC, TRIG, CHOLHDL, LDLDIRECT in the last 72 hours. Thyroid Function Tests: No results for input(s): TSH, T4TOTAL, FREET4, T3FREE, THYROIDAB in the last 72 hours. Anemia Panel: No results for input(s): VITAMINB12, FOLATE, FERRITIN, TIBC, IRON , RETICCTPCT in the last 72 hours. Urine analysis:    Component Value Date/Time   COLORURINE RED (A) 11/21/2024 0457   APPEARANCEUR CLOUDY (A) 11/21/2024 0457   LABSPEC  11/21/2024 0457    TEST NOT REPORTED DUE TO COLOR INTERFERENCE OF URINE PIGMENT   PHURINE  11/21/2024 0457    TEST NOT REPORTED DUE TO COLOR INTERFERENCE OF URINE PIGMENT   GLUCOSEU (A) 11/21/2024 0457    TEST NOT REPORTED DUE TO COLOR INTERFERENCE OF URINE PIGMENT   HGBUR (A) 11/21/2024 0457    TEST NOT REPORTED DUE TO COLOR INTERFERENCE OF URINE PIGMENT   BILIRUBINUR (A) 11/21/2024 0457    TEST NOT REPORTED DUE TO COLOR INTERFERENCE OF URINE PIGMENT   KETONESUR (A) 11/21/2024 0457    TEST NOT REPORTED DUE TO COLOR INTERFERENCE OF URINE PIGMENT   PROTEINUR (A) 11/21/2024 0457    TEST NOT REPORTED DUE TO COLOR INTERFERENCE OF URINE PIGMENT   NITRITE (A) 11/21/2024 0457    TEST NOT REPORTED DUE TO COLOR INTERFERENCE OF URINE PIGMENT   LEUKOCYTESUR (A) 11/21/2024 0457    TEST NOT REPORTED DUE TO COLOR INTERFERENCE OF URINE PIGMENT    Radiological Exams on Admission: No results found. Data Reviewed for HPI: Relevant notes from primary care and specialist visits, past discharge summaries as available in EHR, including Care Everywhere. Prior diagnostic testing as pertinent to current admission  diagnoses Updated medications and problem lists for reconciliation ED course, including vitals, labs, imaging, treatment and response to treatment Triage notes, nursing and pharmacy notes and ED provider's notes Notable results as noted above in HPI      Assessment and Plan: * Diabetic ketoacidosis associated with type 1 diabetes mellitus (HCC) Presents with several hour onset of intractable vomiting Diabetes generally well-controlled with A1c of 7.1 about 3 weeks ago Glucose 219 with anion gap of 30, bicarb 12, venous pH 7.35 and beta hydroxybutyric acid 3.11 IV fluids, IV insulin  and electrolyte monitoring with potassium repletion per Endo tool Transition to subcu when gap is closed and less acidotic  Intractable vomiting Leukocytosis / SIRS Presents with several hour history of intractable vomiting associated with back pain WBC 11.7.  Lipase and LFTs WNL Suspect all related to DKA and expecting resolution with treatment of DKA Hydration, IV antiemetics and IV Protonix  Will get urinalysis to evaluate for UTI  Leukocytosis SIRS WBC 11.7, tachycardic to 102 Suspect secondary to DKA rather than acute infection Ordered urinalysis-not yet done Also having back pain low back, not costovertebral  Hyperkalemia Expecting improvement as DKA is treated Received calcium  gluconate and Lokelma  in the ED, due to concerns for peaked T waves on EKG Monitor for hypokalemia  Acute back pain Patient states she started having low back pain around the same time the vomiting started No prior history of back pain Chronically takes opioids but states it is for the neuropathy in her feet Will get x-ray of lumbar spine Pain control Can consider abdominal imaging in view of vomiting however patient denies abdominal pain Follow-up UA  Acute on chronic anemia Baseline anemia of chronic kidney disease Hemoglobin 9.8, down from 12.4 just 3 weeks prior Noted hematuria on UA from 3 weeks prior on  1/6 No reports of coffee-ground or bloody emesis,  or black or bloody stool Will get serial H&H Given low back pain, will get CT abdomen and pelvis  Chronic diastolic CHF (congestive heart failure) (HCC) Clinically euvolemic to dry Monitor for fluid overload given IV fluids for the treatment of DKA Daily weights  Hypertensive urgency BP 177/108, If able to tolerate, continue home amlodipine  and carvedilol  Hydralazine  IV as needed for additional control  ESRD on home hemodialysis (HCC) Chronic home hemodialysis Patient dialyzes 3 times a week-no specific days, last session was Sunday 1/25 Nephrology consulted for continuation of dialysis  Chronic painful diabetic neuropathy (HCC) Chronic pain syndrome on chronic opiates Depression Continue oxycodone , pregabalin  and sertraline  Received a dose of Dilaudid  in the ED-Will continue as needed if not tolerating orally     DVT prophylaxis: heparin   Consults: renal  Advance Care Planning:   Code Status: Prior   Family Communication: none  Disposition Plan: Back to previous home environment  Severity of Illness: The appropriate patient status for this patient is INPATIENT. Inpatient status is judged to be reasonable and necessary in order to provide the required intensity of service to ensure the patient's safety. The patient's presenting symptoms, physical exam findings, and initial radiographic and laboratory data in the context of their chronic comorbidities is felt to place them at high risk for further clinical deterioration. Furthermore, it is not anticipated that the patient will be medically stable for discharge from the hospital within 2 midnights of admission.   * I certify that at the point of admission it is my clinical judgment that the patient will require inpatient hospital care spanning beyond 2 midnights from the point of admission due to high intensity of service, high risk for further deterioration and high frequency  of surveillance required.*  Author: Delayne LULLA Solian, MD 12/15/2024 8:48 PM  For on call review [email or web address].      [1]  Allergies Allergen Reactions   Losartan  Swelling    Angioedema of tongue   "

## 2024-12-16 ENCOUNTER — Inpatient Hospital Stay: Payer: Self-pay

## 2024-12-16 DIAGNOSIS — M549 Dorsalgia, unspecified: Secondary | ICD-10-CM

## 2024-12-16 DIAGNOSIS — N186 End stage renal disease: Secondary | ICD-10-CM

## 2024-12-16 DIAGNOSIS — Z992 Dependence on renal dialysis: Secondary | ICD-10-CM

## 2024-12-16 LAB — BASIC METABOLIC PANEL WITH GFR
Anion gap: 16 — ABNORMAL HIGH (ref 5–15)
Anion gap: 18 — ABNORMAL HIGH (ref 5–15)
Anion gap: 22 — ABNORMAL HIGH (ref 5–15)
Anion gap: 22 — ABNORMAL HIGH (ref 5–15)
Anion gap: 27 — ABNORMAL HIGH (ref 5–15)
BUN: 31 mg/dL — ABNORMAL HIGH (ref 6–20)
BUN: 35 mg/dL — ABNORMAL HIGH (ref 6–20)
BUN: 66 mg/dL — ABNORMAL HIGH (ref 6–20)
BUN: 78 mg/dL — ABNORMAL HIGH (ref 6–20)
BUN: 80 mg/dL — ABNORMAL HIGH (ref 6–20)
CO2: 12 mmol/L — ABNORMAL LOW (ref 22–32)
CO2: 14 mmol/L — ABNORMAL LOW (ref 22–32)
CO2: 16 mmol/L — ABNORMAL LOW (ref 22–32)
CO2: 21 mmol/L — ABNORMAL LOW (ref 22–32)
CO2: 24 mmol/L (ref 22–32)
Calcium: 7.2 mg/dL — ABNORMAL LOW (ref 8.9–10.3)
Calcium: 7.8 mg/dL — ABNORMAL LOW (ref 8.9–10.3)
Calcium: 7.8 mg/dL — ABNORMAL LOW (ref 8.9–10.3)
Calcium: 7.8 mg/dL — ABNORMAL LOW (ref 8.9–10.3)
Calcium: 8 mg/dL — ABNORMAL LOW (ref 8.9–10.3)
Chloride: 95 mmol/L — ABNORMAL LOW (ref 98–111)
Chloride: 95 mmol/L — ABNORMAL LOW (ref 98–111)
Chloride: 97 mmol/L — ABNORMAL LOW (ref 98–111)
Chloride: 97 mmol/L — ABNORMAL LOW (ref 98–111)
Chloride: 99 mmol/L (ref 98–111)
Creatinine, Ser: 10.8 mg/dL — ABNORMAL HIGH (ref 0.44–1.00)
Creatinine, Ser: 13 mg/dL — ABNORMAL HIGH (ref 0.44–1.00)
Creatinine, Ser: 13.3 mg/dL — ABNORMAL HIGH (ref 0.44–1.00)
Creatinine, Ser: 5.98 mg/dL — ABNORMAL HIGH (ref 0.44–1.00)
Creatinine, Ser: 7.71 mg/dL — ABNORMAL HIGH (ref 0.44–1.00)
GFR, Estimated: 3 mL/min — ABNORMAL LOW
GFR, Estimated: 4 mL/min — ABNORMAL LOW
GFR, Estimated: 4 mL/min — ABNORMAL LOW
GFR, Estimated: 7 mL/min — ABNORMAL LOW
GFR, Estimated: 9 mL/min — ABNORMAL LOW
Glucose, Bld: 187 mg/dL — ABNORMAL HIGH (ref 70–99)
Glucose, Bld: 196 mg/dL — ABNORMAL HIGH (ref 70–99)
Glucose, Bld: 256 mg/dL — ABNORMAL HIGH (ref 70–99)
Glucose, Bld: 513 mg/dL (ref 70–99)
Glucose, Bld: 856 mg/dL (ref 70–99)
Potassium: 3.6 mmol/L (ref 3.5–5.1)
Potassium: 4.1 mmol/L (ref 3.5–5.1)
Potassium: 5.2 mmol/L — ABNORMAL HIGH (ref 3.5–5.1)
Potassium: 5.3 mmol/L — ABNORMAL HIGH (ref 3.5–5.1)
Potassium: 5.9 mmol/L — ABNORMAL HIGH (ref 3.5–5.1)
Sodium: 134 mmol/L — ABNORMAL LOW (ref 135–145)
Sodium: 135 mmol/L (ref 135–145)
Sodium: 135 mmol/L (ref 135–145)
Sodium: 135 mmol/L (ref 135–145)
Sodium: 136 mmol/L (ref 135–145)

## 2024-12-16 LAB — URINALYSIS, COMPLETE (UACMP) WITH MICROSCOPIC
Bilirubin Urine: NEGATIVE
Glucose, UA: >=500 mg/dL — AB
Ketones, ur: 5 mg/dL — AB
Leukocytes,Ua: NEGATIVE
Nitrite: NEGATIVE
Protein, ur: >=300 mg/dL — AB
RBC / HPF: >50 RBC/hpf (ref 0–5)
Specific Gravity, Urine: 1.024 (ref 1.005–1.030)
WBC, UA: >50 WBC/hpf (ref 0–5)
pH: 7 (ref 5.0–8.0)

## 2024-12-16 LAB — CBG MONITORING, ED
Glucose-Capillary: 122 mg/dL — ABNORMAL HIGH (ref 70–99)
Glucose-Capillary: 131 mg/dL — ABNORMAL HIGH (ref 70–99)
Glucose-Capillary: 137 mg/dL — ABNORMAL HIGH (ref 70–99)
Glucose-Capillary: 138 mg/dL — ABNORMAL HIGH (ref 70–99)
Glucose-Capillary: 147 mg/dL — ABNORMAL HIGH (ref 70–99)
Glucose-Capillary: 148 mg/dL — ABNORMAL HIGH (ref 70–99)
Glucose-Capillary: 151 mg/dL — ABNORMAL HIGH (ref 70–99)
Glucose-Capillary: 154 mg/dL — ABNORMAL HIGH (ref 70–99)
Glucose-Capillary: 157 mg/dL — ABNORMAL HIGH (ref 70–99)
Glucose-Capillary: 158 mg/dL — ABNORMAL HIGH (ref 70–99)
Glucose-Capillary: 164 mg/dL — ABNORMAL HIGH (ref 70–99)
Glucose-Capillary: 168 mg/dL — ABNORMAL HIGH (ref 70–99)
Glucose-Capillary: 168 mg/dL — ABNORMAL HIGH (ref 70–99)
Glucose-Capillary: 170 mg/dL — ABNORMAL HIGH (ref 70–99)
Glucose-Capillary: 170 mg/dL — ABNORMAL HIGH (ref 70–99)
Glucose-Capillary: 181 mg/dL — ABNORMAL HIGH (ref 70–99)
Glucose-Capillary: 185 mg/dL — ABNORMAL HIGH (ref 70–99)
Glucose-Capillary: 185 mg/dL — ABNORMAL HIGH (ref 70–99)
Glucose-Capillary: 186 mg/dL — ABNORMAL HIGH (ref 70–99)
Glucose-Capillary: 198 mg/dL — ABNORMAL HIGH (ref 70–99)
Glucose-Capillary: 203 mg/dL — ABNORMAL HIGH (ref 70–99)

## 2024-12-16 LAB — CBC
HCT: 21.4 % — ABNORMAL LOW (ref 36.0–46.0)
HCT: 23.5 % — ABNORMAL LOW (ref 36.0–46.0)
Hemoglobin: 6.9 g/dL — ABNORMAL LOW (ref 12.0–15.0)
Hemoglobin: 7.5 g/dL — ABNORMAL LOW (ref 12.0–15.0)
MCH: 27.8 pg (ref 26.0–34.0)
MCH: 27.8 pg (ref 26.0–34.0)
MCHC: 32.2 g/dL (ref 30.0–36.0)
MCHC: 31.9 g/dL (ref 30.0–36.0)
MCV: 86.3 fL (ref 80.0–100.0)
MCV: 87 fL (ref 80.0–100.0)
Platelets: 267 10*3/uL (ref 150–400)
Platelets: 322 10*3/uL (ref 150–400)
RBC: 2.48 MIL/uL — ABNORMAL LOW (ref 3.87–5.11)
RBC: 2.7 MIL/uL — ABNORMAL LOW (ref 3.87–5.11)
RDW: 13.6 % (ref 11.5–15.5)
RDW: 13.8 % (ref 11.5–15.5)
WBC: 9.1 10*3/uL (ref 4.0–10.5)
WBC: 9.8 10*3/uL (ref 4.0–10.5)
nRBC: 0 % (ref 0.0–0.2)
nRBC: 0 % (ref 0.0–0.2)

## 2024-12-16 LAB — PROCALCITONIN: Procalcitonin: 0.58 ng/mL

## 2024-12-16 LAB — BETA-HYDROXYBUTYRIC ACID
Beta-Hydroxybutyric Acid: 0.09 mmol/L (ref 0.05–0.27)
Beta-Hydroxybutyric Acid: 0.09 mmol/L (ref 0.05–0.27)
Beta-Hydroxybutyric Acid: 0.19 mmol/L (ref 0.05–0.27)
Beta-Hydroxybutyric Acid: 1.33 mmol/L — ABNORMAL HIGH (ref 0.05–0.27)

## 2024-12-16 LAB — POC URINE PREG, ED: Preg Test, Ur: NEGATIVE

## 2024-12-16 MED ORDER — EPOETIN ALFA-EPBX 10000 UNIT/ML IJ SOLN
4000.0000 [IU] | INTRAMUSCULAR | Status: DC
  Filled 2024-12-16 (×2): qty 1

## 2024-12-16 MED ORDER — ONDANSETRON HCL 4 MG/2ML IJ SOLN
4.0000 mg | Freq: Four times a day (QID) | INTRAMUSCULAR | Status: DC | PRN
  Administered 2024-12-16 – 2024-12-18 (×4): 4 mg via INTRAVENOUS
  Filled 2024-12-16 (×5): qty 2

## 2024-12-16 MED ORDER — PATIROMER SORBITEX CALCIUM 8.4 G PO PACK
16.8000 g | PACK | Freq: Once | ORAL | Status: AC
  Administered 2024-12-16: 16.8 g via ORAL
  Filled 2024-12-16 (×2): qty 2

## 2024-12-16 MED ORDER — PROCHLORPERAZINE EDISYLATE 10 MG/2ML IJ SOLN
10.0000 mg | INTRAMUSCULAR | Status: DC | PRN
  Administered 2024-12-16 – 2024-12-17 (×4): 10 mg via INTRAVENOUS
  Filled 2024-12-16 (×4): qty 2

## 2024-12-16 MED ORDER — LABETALOL HCL 5 MG/ML IV SOLN
10.0000 mg | Freq: Four times a day (QID) | INTRAVENOUS | Status: DC | PRN
  Administered 2024-12-16 – 2024-12-18 (×2): 10 mg via INTRAVENOUS
  Filled 2024-12-16 (×2): qty 4

## 2024-12-16 MED ORDER — CALCIUM GLUCONATE-NACL 1-0.675 GM/50ML-% IV SOLN
1.0000 g | Freq: Once | INTRAVENOUS | Status: AC
  Administered 2024-12-16: 1000 mg via INTRAVENOUS
  Filled 2024-12-16: qty 50

## 2024-12-16 MED ORDER — PANTOPRAZOLE SODIUM 40 MG IV SOLR
40.0000 mg | Freq: Two times a day (BID) | INTRAVENOUS | Status: DC
  Administered 2024-12-16 – 2024-12-18 (×5): 40 mg via INTRAVENOUS
  Filled 2024-12-16 (×5): qty 10

## 2024-12-16 MED ORDER — DEXTROSE IN LACTATED RINGERS 5 % IV SOLN: INTRAVENOUS | Status: AC

## 2024-12-16 NOTE — Progress Notes (Signed)
 [facility], [place] 12/16/24  Subjective:   LOS: 1  Patient known to our practice from outpatient dialysis.  She presents for intractable nausea and vomiting and back pain due to the retching.  She has not had her dialysis for several days now.    According to outpatient records, her last dialysis treatment was on 12/12/2024.  Her target weight is 76.5 kg.  Upon presentation, her blood pressure was elevated.  Glucose was also elevated and she was started on insulin  infusion nephrology consult has been quested to arrange inpatient hemodialysis.  Review of labs indicate her potassium on presentation was 5.9, subsequent potassium 5.3.  Objective:  Vital signs in last 24 hours:  Temp:  [98.7 F (37.1 C)-98.9 F (37.2 C)] 98.7 F (37.1 C) (01/31 1059) Pulse Rate:  [87-104] 87 (01/31 1230) Resp:  [17-37] 22 (01/31 1230) BP: (149-177)/(91-110) 161/98 (01/31 1230) SpO2:  [87 %-98 %] 92 % (01/31 1230) Weight:  [80 kg] 80 kg (01/30 1846)  Weight change:  Filed Weights   12/15/24 1846  Weight: 80 kg    Intake/Output:    Intake/Output Summary (Last 24 hours) at 12/16/2024 1251 Last data filed at 12/15/2024 2100 Gross per 24 hour  Intake 1000 ml  Output --  Net 1000 ml     Physical Exam: General: Chronically ill-appearing, sitting up in the bed  HEENT Dry oral mucous membranes  Pulm/lungs Normal breathing effort  CVS/Heart Tachycardic  Abdomen:  Nondistended, nontender  Extremities: No significant peripheral edema  Neurologic: Alert, able to answer simple questions and follow commands  Skin: No acute rashes  Access: Right upper arm AV fistula       Basic Metabolic Panel:  Recent Labs  Lab 12/15/24 1849 12/15/24 2341 12/16/24 0518 12/16/24 0728  NA 135 136 135 135  K 5.8* 5.2* 5.9* 5.3*  CL 93* 97* 97* 99  CO2 12* 12* 16* 14*  GLUCOSE 219* 256* 187* 856*  BUN 79* 78* 80* 66*  CREATININE 13.30* 13.00* 13.30* 10.80*  CALCIUM  8.1* 7.8* 7.8*  7.2*  MG 2.6*  --   --   --      CBC: Recent Labs  Lab 12/15/24 1849 12/16/24 0244 12/16/24 0518  WBC 10.7* 9.1 9.8  NEUTROABS 9.1*  --   --   HGB 9.8* 6.9* 7.5*  HCT 30.5* 21.4* 23.5*  MCV 85.4 86.3 87.0  PLT 359 267 322      Lab Results  Component Value Date   HEPBSAG NON REACTIVE 11/20/2024      Microbiology:  No results found for this or any previous visit (from the past 240 hours).  Coagulation Studies: No results for input(s): LABPROT, INR in the last 72 hours.  Urinalysis: Recent Labs    12/16/24 0550  COLORURINE AMBER*  LABSPEC 1.024  PHURINE 7.0  GLUCOSEU >=500*  HGBUR MODERATE*  BILIRUBINUR NEGATIVE  KETONESUR 5*  PROTEINUR >=300*  NITRITE NEGATIVE  LEUKOCYTESUR NEGATIVE      Imaging: CT ABDOMEN PELVIS WO CONTRAST Result Date: 12/16/2024 EXAM: CT ABDOMEN AND PELVIS WITHOUT CONTRAST 12/16/2024 06:34:48 AM TECHNIQUE: CT of the abdomen and pelvis was performed without the administration of intravenous contrast. Multiplanar reformatted images are provided for review. Automated exposure control, iterative reconstruction, and/or weight-based adjustment of the mA/kV was utilized to reduce the radiation dose to as low as reasonably achievable. COMPARISON: 04/19/2022 CLINICAL HISTORY: Abdominal/flank pain, stone suspected. FINDINGS: LOWER CHEST: Small bilateral pleural effusions. Extensive ground glass and airspace densities are noted  throughout the imaged portions of the right middle lobe and right lower lobe, and to a lesser degree, the left lower lobe and lingula. LIVER: The liver is unremarkable. GALLBLADDER AND BILE DUCTS: Gallbladder is unremarkable. No biliary ductal dilatation. SPLEEN: No acute abnormality. PANCREAS: No acute abnormality. ADRENAL GLANDS: Normal appearance of the adrenal glands. KIDNEYS, URETERS AND BLADDER: Bilateral perinephric soft tissue stranding is identified, nonspecific. No kidney stones or hydronephrosis identified. No stones  are identified along the course of either ureter. The bladder is unremarkable. GI AND BOWEL: Circumferential wall thickening involving the distal esophagus is noted. The stomach appears normal. No pathologic dilatation of the bowel loops. The appendix is not confidently identified. No secondary signs of acute appendicitis within the pericecal region. There is mild wall thickening involving the ascending and transverse colon without significant pericolonic soft tissue stranding. Moderate formed stool noted within the rectum. PERITONEUM AND RETROPERITONEUM: No free fluid or fluid collections identified. No free air. VASCULATURE: Aorta is normal in caliber. LYMPH NODES: No lymphadenopathy. REPRODUCTIVE ORGANS: The uterus and adnexal structures are unremarkable. BONES AND SOFT TISSUES: Small fat-containing left paramidline ventral abdominal wall hernia. No acute osseous abnormality. No focal soft tissue abnormality. IMPRESSION: 1. Extensive multifocal ground-glass and airspace densities in both lungs. Imaging findings may reflect multifocal infection, ARDS and/or alveolar edema or hemorrhage. Clinical correlation advised. 2. Small bilateral pleural effusions with interstitial edema. 3. No urolithiasis or hydronephrosis. 4. Bilateral perinephric fat stranding is nonspecific but may be seen in the setting of acute pyelonephritis. 5. Mild wall thickening of the ascending and transverse colon, this may reflect incomplete distention or uncomplicated colitis. Electronically signed by: Waddell Calk MD 12/16/2024 08:14 AM EST RP Workstation: [id number]     Medications:    dextrose  5% lactated ringers  125 mL/hr at 12/16/24 1218   insulin  0.7 Units/hr (12/16/24 1223)   lactated ringers  Stopped (12/15/24 2123)    amLODipine   10 mg Oral Daily   atorvastatin   80 mg Oral Daily   calcium  acetate  1,334 mg Oral TID WC   carvedilol   6.25 mg Oral BID   heparin   5,000 Units Subcutaneous Q8H   montelukast   10 mg Oral QHS    norethindrone -ethinyl estradiol   1 tablet Oral Daily   pantoprazole  (PROTONIX ) IV  40 mg Intravenous Q12H   pregabalin   50 mg Oral TID   sertraline   50 mg Oral Daily   sodium zirconium cyclosilicate   10 g Oral Once   acetaminophen  **OR** acetaminophen , dextrose , HYDROmorphone  (DILAUDID ) injection, labetalol , ondansetron  (ZOFRAN ) IV, oxyCODONE , prochlorperazine   Assessment/ Plan:  29 y.o. female with past medical history of diabetes type 1, polyneuropathy, diastolic CHF and end stage renal disease on home hemodialysis dialysis, previous admissions for DKA was admitted on 12/15/2024 for  Principal Problem:   Diabetic ketoacidosis associated with type 1 diabetes mellitus (HCC) Active Problems:   Depression   Intractable vomiting   ESRD on home hemodialysis (HCC)   Anemia of chronic disease   Chronic painful diabetic neuropathy (HCC)   Hypertensive urgency   Chronic home hemodialysis status   Chronic diastolic CHF (congestive heart failure) (HCC)   Chronic pain syndrome   Acute on chronic anemia   Hyperkalemia   Leukocytosis   Acute back pain  Diabetic ketoacidosis associated with type 1 diabetes mellitus (HCC) [E10.10]  #.  Intractable nausea, vomiting, hyperkalemia in the setting of ESRD Outpatient dialysis: Home hemodialysis/[place] at home/MWFS/target weight 76.5 kg/Right upper arm AV fistula Patient will get bedside hemodialysis as she is  requiring insulin  drip for DKA management.  No fluid removal is ordered because of ongoing nausea/vomiting.  #. Anemia of CKD  Lab Results  Component Value Date   HGB 7.5 (L) 12/16/2024   Low dose EPO with HD  #. Secondary hyperparathyroidism of renal origin N 25.81   No results found for: PTH Lab Results  Component Value Date   PHOS 9.8 (H) 11/20/2024   Monitor calcium  and phos level during this admission   #. Diabetes type 2 with CKD Hgb A1c MFr Bld (%)  Date Value  11/19/2024 7.1 (H)  Currently on insulin  drip.   Management as per hospitalist team.   LOS: 1 Sherese Heyward Dennise 1/31/202612:51 PM  [facility] [address] [place], [place] [phone]

## 2024-12-16 NOTE — Assessment & Plan Note (Signed)
 Patient states she started having low back pain around the same time the vomiting started No prior history of back pain Chronically takes opioids but states it is for the neuropathy in her feet Will get x-ray of lumbar spine Pain control Can consider abdominal imaging in view of vomiting however patient denies abdominal pain Follow-up UA

## 2024-12-16 NOTE — ED Notes (Signed)
 Pt educated on the need for urine multiple times. Pt not amenable to in/out cath until she tries to urinate first. Pts BUN/Creatinine noted to be worse than 3 weeks ago. Pt attempted to use the bathroom at this time.

## 2024-12-16 NOTE — ED Notes (Signed)
 Patient's O2 dropped after Dilaudid  was administered. 2L [place] was applied. Patients O2 responded well.

## 2024-12-16 NOTE — Progress Notes (Signed)
 [facility] Progress Note  Patient: Emma Thomas    FMW:[id number]  DOA: 12/15/2024     Date of Service: the patient was seen and examined on 12/16/2024  Chief Complaint  Patient presents with   Nausea   Emesis   Brief hospital course:  Emma Thomas is a 28 y.o. female with medical history significant for ESRD on home hemodialysis 3 times a week, chronic anemia , HTN, type 1 diabetes with painful polyneuropathy, diastolic CHF, being admitted in DKA.  She presented with a several hour  history of intractable nonbloody, nonbilious vomiting associated with back pain due to retching.  No significant abdominal pain denies denies fever, cough, change in bowel habits. On arrival, hypertensive to 177/108, tachycardic at 104 and tachypneic at 22, afebrile. Labs notable for the following: - Glucose 219 with anion gap of 30, bicarb 12, venous pH 7.35 and beta hydroxybutyric acid 3.11 - Lipase less than 10 and normal LFTs - WBC 10.7 - Hemoglobin 9.8, down from 12.4 just 3 weeks prior - Potassium 5.8 and magnesium  2.6   EKG, personally interpreted showing sinus tachycardia at 102 with mildly prolonged QT interval of 552   No imaging done   Patient was treated with:  -LR bolus and started on insulin  infusion per Endo tool -Calcium  gluconate and Lokelma  for hyperkalemia, due to concerns for peaked T waves on EKG -Dilaudid  for back pain   Admission requested    Assessment and Plan:   # Diabetic ketoacidosis associated with type 1 diabetes mellitus  Presents with several hour onset of intractable vomiting Diabetes generally well-controlled with A1c of 7.1 about 3 weeks ago Glucose 219 with anion gap of 30, bicarb 12, venous pH 7.35 and beta hydroxybutyric acid 3.11 IV fluids, IV insulin  and electrolyte monitoring with potassium repletion per Endo tool Transition to subcu when gap is closed and less acidotic   # Intractable Nausea and vomiting Leukocytosis / SIRS Presents with  several hour history of intractable vomiting associated with back pain WBC 11.7.  Lipase and LFTs WNL Suspect all related to DKA and expecting resolution with treatment of DKA Hydration, IV antiemetics and IV Protonix  UA, negative LE and nitrite, bacteria many, WBC >50.  Pyuria most likely contamination versus colonization Urine culture was not processed Trend WBC count Monitor off antibiotics for now    # Hyperkalemia Expecting improvement as DKA is treated Received calcium  gluconate and Lokelma  in the ED, due to concerns for peaked T waves on EKG Monitor for hypokalemia   # Acute back pain Patient states she started having low back pain around the same time the vomiting started. No prior history of back pain Chronically takes opioids but states it is for the neuropathy in her feet Pain control Can consider abdominal imaging in view of vomiting however patient denies abdominal pain    # Acute on chronic anemia due to CKD/ESRD Hemoglobin 9.8, down from 12.4 just 3 weeks prior Noted hematuria on UA from 3 weeks prior on 1/6 No reports of coffee-ground or bloody emesis, or black or bloody stool Will get serial H&H Given low back pain, will get CT abdomen and pelvis   Chronic diastolic CHF (congestive heart failure) Clinically euvolemic to dry Monitor for fluid overload given IV fluids for the treatment of DKA Daily weights   Hypertensive urgency BP 177/108, If able to tolerate, continue home amlodipine  and carvedilol  Hydralazine  IV as needed for additional control   # ESRD on home hemodialysis (HCC) Chronic home  hemodialysis Patient dialyzes 3 times a week-no specific days, last session was Sunday 1/25, patient seems noncompliance with hemodialysis Nephrology consulted for continuation of dialysis   # Chronic painful diabetic neuropathy (HCC) Chronic pain syndrome on chronic opiates Depression Continue oxycodone , pregabalin  and sertraline  Received a dose of Dilaudid  in  the ED-Will continue as needed if not tolerating orally    Body mass index is 28.47 kg/m.  Interventions:   Diet: NPO DVT Prophylaxis: Subcutaneous Heparin     Advance goals of care discussion: Full code  Family Communication: family was not present at bedside, at the time of interview.  The pt provided permission to discuss medical plan with the family. Opportunity was given to ask question and all questions were answered satisfactorily.   Disposition:  Pt is from Home, admitted with DKA and missed HD, still getting treatment for DKA, which precludes a safe discharge. Discharge to home, when stable, may need few days to improve.  Subjective: No significant events overnight.  Patient was still complaining of backache 8/10 secondary to constant vomiting, given patient had nausea, vomiting was 1 hour ago, denies any abdominal pain.  Breathing is getting better.  Physical Exam: General: NAD, lying comfortably Appear in no distress, affect appropriate Eyes: PERRLA ENT: Oral Mucosa Clear, moist  Neck: no JVD,  Cardiovascular: S1 and S2 Present, no Murmur,  Respiratory: good respiratory effort, Bilateral Air entry equal and Decreased, no Crackles, no wheezes Abdomen: Bowel Sound present, Soft and no tenderness,  Skin: no rashes Extremities: no Pedal edema, no calf tenderness Neurologic: without any new focal findings Gait not checked due to patient safety concerns  Vitals:   12/16/24 1145 12/16/24 1200 12/16/24 1215 12/16/24 1230  BP: (!) 170/105 (!) 172/107 (!) 172/106 (!) 161/98  Pulse: 91 93 93 87  Resp: 17 (!) 23 17 (!) 22  Temp:      TempSrc:      SpO2:  93% 95% 92%  Weight:      Height:        Intake/Output Summary (Last 24 hours) at 12/16/2024 1240 Last data filed at 12/15/2024 2100 Gross per 24 hour  Intake 1000 ml  Output --  Net 1000 ml   Filed Weights   12/15/24 1846  Weight: 80 kg    Data Reviewed: I have personally reviewed and interpreted daily labs,  tele strips, imagings as discussed above. I reviewed all nursing notes, pharmacy notes, vitals, pertinent old records I have discussed plan of care as described above with RN and patient/family.  CBC: Recent Labs  Lab 12/15/24 1849 12/16/24 0244 12/16/24 0518  WBC 10.7* 9.1 9.8  NEUTROABS 9.1*  --   --   HGB 9.8* 6.9* 7.5*  HCT 30.5* 21.4* 23.5*  MCV 85.4 86.3 87.0  PLT 359 267 322   Basic Metabolic Panel: Recent Labs  Lab 12/15/24 1849 12/15/24 2341 12/16/24 0518 12/16/24 0728  NA 135 136 135 135  K 5.8* 5.2* 5.9* 5.3*  CL 93* 97* 97* 99  CO2 12* 12* 16* 14*  GLUCOSE 219* 256* 187* 856*  BUN 79* 78* 80* 66*  CREATININE 13.30* 13.00* 13.30* 10.80*  CALCIUM  8.1* 7.8* 7.8* 7.2*  MG 2.6*  --   --   --     Studies: CT ABDOMEN PELVIS WO CONTRAST Result Date: 12/16/2024 EXAM: CT ABDOMEN AND PELVIS WITHOUT CONTRAST 12/16/2024 06:34:48 AM TECHNIQUE: CT of the abdomen and pelvis was performed without the administration of intravenous contrast. Multiplanar reformatted images are provided for review.  Automated exposure control, iterative reconstruction, and/or weight-based adjustment of the mA/kV was utilized to reduce the radiation dose to as low as reasonably achievable. COMPARISON: 04/19/2022 CLINICAL HISTORY: Abdominal/flank pain, stone suspected. FINDINGS: LOWER CHEST: Small bilateral pleural effusions. Extensive ground glass and airspace densities are noted throughout the imaged portions of the right middle lobe and right lower lobe, and to a lesser degree, the left lower lobe and lingula. LIVER: The liver is unremarkable. GALLBLADDER AND BILE DUCTS: Gallbladder is unremarkable. No biliary ductal dilatation. SPLEEN: No acute abnormality. PANCREAS: No acute abnormality. ADRENAL GLANDS: Normal appearance of the adrenal glands. KIDNEYS, URETERS AND BLADDER: Bilateral perinephric soft tissue stranding is identified, nonspecific. No kidney stones or hydronephrosis identified. No stones are  identified along the course of either ureter. The bladder is unremarkable. GI AND BOWEL: Circumferential wall thickening involving the distal esophagus is noted. The stomach appears normal. No pathologic dilatation of the bowel loops. The appendix is not confidently identified. No secondary signs of acute appendicitis within the pericecal region. There is mild wall thickening involving the ascending and transverse colon without significant pericolonic soft tissue stranding. Moderate formed stool noted within the rectum. PERITONEUM AND RETROPERITONEUM: No free fluid or fluid collections identified. No free air. VASCULATURE: Aorta is normal in caliber. LYMPH NODES: No lymphadenopathy. REPRODUCTIVE ORGANS: The uterus and adnexal structures are unremarkable. BONES AND SOFT TISSUES: Small fat-containing left paramidline ventral abdominal wall hernia. No acute osseous abnormality. No focal soft tissue abnormality. IMPRESSION: 1. Extensive multifocal ground-glass and airspace densities in both lungs. Imaging findings may reflect multifocal infection, ARDS and/or alveolar edema or hemorrhage. Clinical correlation advised. 2. Small bilateral pleural effusions with interstitial edema. 3. No urolithiasis or hydronephrosis. 4. Bilateral perinephric fat stranding is nonspecific but may be seen in the setting of acute pyelonephritis. 5. Mild wall thickening of the ascending and transverse colon, this may reflect incomplete distention or uncomplicated colitis. Electronically signed by: Taylor Stroud MD 12/16/2024 08:14 AM EST RP Workstation: [id number]    Scheduled Meds:  amLODipine   10 mg Oral Daily   atorvastatin   80 mg Oral Daily   calcium  acetate  1,334 mg Oral TID WC   carvedilol   6.25 mg Oral BID   heparin   5,000 Units Subcutaneous Q8H   montelukast   10 mg Oral QHS   norethindrone -ethinyl estradiol   1 tablet Oral Daily   pantoprazole  (PROTONIX ) IV  40 mg Intravenous Q12H   pregabalin   50 mg Oral TID   sertraline    50 mg Oral Daily   sodium zirconium cyclosilicate   10 g Oral Once   Continuous Infusions:  dextrose  5% lactated ringers  125 mL/hr at 12/16/24 1218   insulin  0.7 Units/hr (12/16/24 1223)   lactated ringers  Stopped (12/15/24 2123)   PRN Meds: acetaminophen  **OR** acetaminophen , dextrose , HYDROmorphone  (DILAUDID ) injection, labetalol , ondansetron  (ZOFRAN ) IV, oxyCODONE , prochlorperazine   Time spent: 55 minutes  Author: ELVAN SOR. MD [facility] 12/16/2024 12:40 PM  To reach On-call, see care teams to locate the attending and reach out to them via [email or web address]. If 7PM-7AM, please contact night-coverage If you still have difficulty reaching the attending provider, please page the [facility] (Director on Call) for [facility] on amion for assistance.

## 2024-12-16 NOTE — ED Notes (Signed)
 Placed patient on 2L [place] during dialysis.

## 2024-12-16 NOTE — Inpatient Diabetes Management (Signed)
 Inpatient Diabetes Program Recommendations  AACE/ADA: New Consensus Statement on Inpatient Glycemic Control (2015)  Target Ranges:  Prepandial:   less than 140 mg/dL      Peak postprandial:   less than 180 mg/dL (1-2 hours)      Critically ill patients:  140 - 180 mg/dL   Lab Results  Component Value Date   GLUCAP 170 (H) 12/16/2024   HGBA1C 7.1 (H) 11/19/2024    Review of Glycemic Control  Latest Reference Range & Units 12/16/24 08:06 12/16/24 09:06  Glucose-Capillary 70 - 99 mg/dL 845 (H) 829 (H)  (H): Data is abnormally high Diabetes history: DM1 Outpatient Diabetes medications: T-Slim pump with Humalog  insulin  Time Basal Rate (units/hr) 12:00 AM 0.5 3:00 AM 0.4 6:00 AM 0.6 11:00 AM 0.6 4:00 PM 0.8 9:00 PM 0.6 Total Daily Basal: 14.5 units  Time Correction Factor (units:mg/dL) 87:99 AM 8:34 6:99 AM 1:80 6:00 AM 1:50 11:00 AM 1:50 4:00 PM 1:40 9:00 PM 1:55  Time Carb Ratio (units:grams) 12:00 AM 1:8 3:00 AM 1:8 6:00 AM 1:8 11:00 AM 1:8 4:00 PM 1:7 9:00 PM 1:8  Time Target BG (mg/dL) 87:99 AM 889 6:99 AM 889 6:00 AM 110 11:00 AM 110 4:00 PM 110 9:00 PM 110  Current orders for Inpatient glycemic control: IV insulin    Inpatient Diabetes Program Recommendations:    At this time, continue with IV insulin .   When ready to transition could consider ordering insulin  glargine 12 units Q24H, CBGs Q4H, Novolog  0-6 units Q4H, and if eating Novolog  2 units TID with meals if patient eats at least 50% of meals.   NOTE: Per chart review, patient has DM1 and sees [facility]. Diabetes coordinator spoke with patient on 11/20/24. Based on previous admission, did not bring pump supplies. Attempted to call to verify; no answer.  Thanks, Tinnie Minus, MSN, RNC-OB Diabetes Coordinator [phone] (8a-5p)

## 2024-12-16 NOTE — ED Notes (Signed)
 Pt provided urine specimen at this time. MD aware.

## 2024-12-16 NOTE — Consult Note (Signed)
" ° °  NAME:  Emma Thomas, MRN:  [id number], DOB:  June 18, 1996, LOS: 1 ADMISSION DATE:  12/15/2024, CONSULTATION DATE:  12/16/2024 REFERRING MD:  Elvan Sor MD, CHIEF COMPLAINT:  Shotness of breath   History of Present Illness:  Emma Thomas is a 29 year old female patient with a past medical history of end-stage renal disease on home hemodialysis 3 times a week, hypertension, chronic anemia and type 1 diabetes mellitus with polyneuropathy recent admission to [facility] in January 2026 for influenza B, DKA and end-stage renal disease missing HD presenting now with similar presentation.   Pulmonary team is being consulted for CT abdomen pelvis findings of extensive ground glass and airspace densities and small bilateral pleural effusions.  She reports 1 week of generally feeling unwell with nausea and vomiting.  She has missed 2 or 3 sessions of HD.  Her last dialysis session was Sunday.  She also has missed few doses of insulin .  On presentation, she was found to be in high anion gap metabolic acidosis, hyperglycemia and concern of DKA.  Respiratory status is stable on 2 L nasal cannula saturating 9495%.  I personally reviewed her CT abdomen pelvis which showed bronchovascular ground glass and airspace densities in the batwing pattern with small bilateral pleural effusion.  This is most consistent with pulmonary edema.  Pertinent  Medical History  As above.   Objective    Blood pressure (!) 161/98, pulse 87, temperature 98.7 F (37.1 C), temperature source Oral, resp. rate (!) 22, height 5' 6 (1.676 m), weight 80 kg, SpO2 92%.        Intake/Output Summary (Last 24 hours) at 12/16/2024 1259 Last data filed at 12/15/2024 2100 Gross per 24 hour  Intake 1000 ml  Output --  Net 1000 ml   Filed Weights   12/15/24 1846  Weight: 80 kg    Examination: General: Acutely ill complaining of nausea HENT: Supple neck, reactive pupils Lungs: Bibasilar crackles noted Cardiovascular: Normal S1, normal  S2, regular rate and rhythm Abdomen: Soft nontender nondistended positive bowel sound Extremities: Warm well-perfused no edema  Labs and imaging were reviewed Assessment and Plan  [name] is a 29 year old female patient with a past medical history of end-stage renal disease on home hemodialysis 3 times a week, hypertension, chronic anemia and type 1 diabetes mellitus with polyneuropathy recent admission to [facility] in January 2026 for influenza B, DKA and end-stage renal disease missing HD presenting now with similar presentation.   -I personally reviewed her CT abdomen and pelvis and looked at the lower part of the lungs that show bilateral ground glass opacities diffuse distributed around the bronchovascular bundle.  It is associated with small bilateral pleural effusions.  This is most consistent with pulmonary edema in the setting of missing multiple HD sessions.   I would recommend obtaining a CT chest without contrast in the morning.  Continue with DKA management per primary team.  I personally spent a total of 80 minutes in the care of the patient today including preparing to see the patient, getting/reviewing separately obtained history, performing a medically appropriate exam/evaluation, counseling and educating, placing orders, documenting clinical information in the EHR, independently interpreting results, and communicating results.   Darrin Barn, MD [facility] 12/16/2024 1:02 PM   "

## 2024-12-16 NOTE — ED Notes (Signed)
 Messaged provider Von, MD for zofran  order due to patient having intermittent nausea and vomiting this morning since AM nurse came onto shift.

## 2024-12-17 ENCOUNTER — Inpatient Hospital Stay: Payer: Self-pay

## 2024-12-17 LAB — PREPARE RBC (CROSSMATCH)

## 2024-12-17 LAB — BASIC METABOLIC PANEL WITH GFR
Anion gap: 14 (ref 5–15)
BUN: 36 mg/dL — ABNORMAL HIGH (ref 6–20)
CO2: 24 mmol/L (ref 22–32)
Calcium: 7.4 mg/dL — ABNORMAL LOW (ref 8.9–10.3)
Chloride: 96 mmol/L — ABNORMAL LOW (ref 98–111)
Creatinine, Ser: 8.14 mg/dL — ABNORMAL HIGH (ref 0.44–1.00)
GFR, Estimated: 6 mL/min — ABNORMAL LOW
Glucose, Bld: 182 mg/dL — ABNORMAL HIGH (ref 70–99)
Potassium: 4.5 mmol/L (ref 3.5–5.1)
Sodium: 134 mmol/L — ABNORMAL LOW (ref 135–145)

## 2024-12-17 LAB — CBG MONITORING, ED
Glucose-Capillary: 166 mg/dL — ABNORMAL HIGH (ref 70–99)
Glucose-Capillary: 131 mg/dL — ABNORMAL HIGH (ref 70–99)
Glucose-Capillary: 182 mg/dL — ABNORMAL HIGH (ref 70–99)
Glucose-Capillary: 148 mg/dL — ABNORMAL HIGH (ref 70–99)
Glucose-Capillary: 142 mg/dL — ABNORMAL HIGH (ref 70–99)
Glucose-Capillary: 134 mg/dL — ABNORMAL HIGH (ref 70–99)
Glucose-Capillary: 123 mg/dL — ABNORMAL HIGH (ref 70–99)
Glucose-Capillary: 138 mg/dL — ABNORMAL HIGH (ref 70–99)

## 2024-12-17 LAB — PHOSPHORUS: Phosphorus: 5.6 mg/dL — ABNORMAL HIGH (ref 2.5–4.6)

## 2024-12-17 LAB — CBC
HCT: 22.5 % — ABNORMAL LOW (ref 36.0–46.0)
Hemoglobin: 7.2 g/dL — ABNORMAL LOW (ref 12.0–15.0)
MCH: 27.9 pg (ref 26.0–34.0)
MCHC: 32 g/dL (ref 30.0–36.0)
MCV: 87.2 fL (ref 80.0–100.0)
Platelets: 291 10*3/uL (ref 150–400)
RBC: 2.58 MIL/uL — ABNORMAL LOW (ref 3.87–5.11)
RDW: 14.1 % (ref 11.5–15.5)
WBC: 5.4 10*3/uL (ref 4.0–10.5)
nRBC: 0.6 % — ABNORMAL HIGH (ref 0.0–0.2)

## 2024-12-17 LAB — MAGNESIUM: Magnesium: 2.1 mg/dL (ref 1.7–2.4)

## 2024-12-17 LAB — VITAMIN D 25 HYDROXY (VIT D DEFICIENCY, FRACTURES): Vit D, 25-Hydroxy: 11.1 ng/mL — ABNORMAL LOW (ref 30–100)

## 2024-12-17 MED ORDER — INSULIN ASPART 100 UNIT/ML IJ SOLN: 0.0000 [IU] | INTRAMUSCULAR | Status: DC

## 2024-12-17 MED ORDER — INSULIN ASPART 100 UNIT/ML IJ SOLN
2.0000 [IU] | Freq: Three times a day (TID) | INTRAMUSCULAR | Status: DC
  Administered 2024-12-18: 2 [IU] via SUBCUTANEOUS
  Filled 2024-12-17: qty 2

## 2024-12-17 MED ORDER — INSULIN GLARGINE-YFGN 100 UNIT/ML ~~LOC~~ SOLN
12.0000 [IU] | SUBCUTANEOUS | Status: DC
  Administered 2024-12-17 – 2024-12-18 (×2): 12 [IU] via SUBCUTANEOUS
  Filled 2024-12-17 (×2): qty 0.12

## 2024-12-17 MED ORDER — SODIUM CHLORIDE 0.9% IV SOLUTION
Freq: Once | INTRAVENOUS | Status: AC
  Filled 2024-12-17: qty 250

## 2024-12-17 MED ORDER — CALCITRIOL 0.25 MCG PO CAPS
0.5000 ug | ORAL_CAPSULE | Freq: Every day | ORAL | Status: DC
  Administered 2024-12-18: 0.5 ug via ORAL
  Filled 2024-12-17 (×2): qty 2

## 2024-12-17 MED ORDER — INSULIN ASPART 100 UNIT/ML IJ SOLN: 0.0000 [IU] | Freq: Every day | INTRAMUSCULAR | Status: DC

## 2024-12-17 NOTE — ED Notes (Signed)
 Pt sleeping. Respirations even and unlabored. Insulin  titrated to 0.6 units/hr. Should come off insulin  drip with next CBG check. Lantus  was already given by previous shift [facility].

## 2024-12-17 NOTE — ED Notes (Addendum)
 Hospitalist NP contacted about BMP lab values and Endotool recommendation via secure chat stating okay, I was going by the DKA protocol, so we are not initiating transition orders until the next BMP in 4 hours? . At this time no new transition orders received.

## 2024-12-17 NOTE — Progress Notes (Signed)
 [facility] Progress Note  Patient: Emma Thomas    FMW:[id number]  DOA: 12/15/2024     Date of Service: the patient was seen and examined on 12/17/2024  Chief Complaint  Patient presents with   Nausea   Emesis   Brief hospital course:  Emma Thomas is a 29 y.o. female with medical history significant for ESRD on home hemodialysis 3 times a week, chronic anemia , HTN, type 1 diabetes with painful polyneuropathy, diastolic CHF, being admitted in DKA.  She presented with a several hour  history of intractable nonbloody, nonbilious vomiting associated with back pain due to retching.  No significant abdominal pain denies denies fever, cough, change in bowel habits. On arrival, hypertensive to 177/108, tachycardic at 104 and tachypneic at 22, afebrile. Labs notable for the following: - Glucose 219 with anion gap of 30, bicarb 12, venous pH 7.35 and beta hydroxybutyric acid 3.11 - Lipase less than 10 and normal LFTs - WBC 10.7 - Hemoglobin 9.8, down from 12.4 just 3 weeks prior - Potassium 5.8 and magnesium  2.6   EKG, personally interpreted showing sinus tachycardia at 102 with mildly prolonged QT interval of 552   No imaging done   Patient was treated with:  -LR bolus and started on insulin  infusion per Endo tool -Calcium  gluconate and Lokelma  for hyperkalemia, due to concerns for peaked T waves on EKG -Dilaudid  for back pain   Admission requested    Assessment and Plan:   # Diabetic ketoacidosis associated with type 1 diabetes mellitus  Presents with several hour onset of intractable vomiting Diabetes generally well-controlled with A1c of 7.1 about 3 weeks ago Glucose 219 with anion gap of 30, bicarb 12, venous pH 7.35 and beta hydroxybutyric acid 3.11 S/p IV fluids, IV insulin  and electrolyte monitoring with potassium repletion per Endo tool 2/1 AG closed, transition to Semglee  20 units subcu daily, NovoLog  2 units 3 times daily and NovoLog  sliding scale Continue  diabetic diet, monitor CBG  # Intractable Nausea and vomiting Leukocytosis / SIRS Presents with several hour history of intractable vomiting associated with back pain WBC 11.7.  Lipase and LFTs WNL Suspect all related to DKA and expecting resolution with treatment of DKA S/p IV Hydration, IV antiemetics and IV Protonix  UA, negative LE and nitrite, bacteria many, WBC >50.  Pyuria most likely contamination versus colonization Urine culture was not processed Trend WBC count Monitor off antibiotics for now    # Hyperkalemia Expecting improvement as DKA is treated Received calcium  gluconate and Lokelma  in the ED, due to concerns for peaked T waves on EKG Monitor for hypokalemia   # Acute back pain Patient states she started having low back pain around the same time the vomiting started. No prior history of back pain Chronically takes opioids but states it is for the neuropathy in her feet Pain control Can consider abdominal imaging in view of vomiting however patient denies abdominal pain    # Acute on chronic anemia due to CKD/ESRD Hemoglobin 9.8, down from 12.4 just 3 weeks prior Noted hematuria on UA from 3 weeks prior on 1/6 No reports of coffee-ground or bloody emesis, or black or bloody stool Will get serial H&H Given low back pain, CT a/p done, showed pyelonephritis and colitis. Clinically no symptoms of infection, will monitor off antibiotics 2/1 Hb 7.2, low 1 unit PRBC transfusion ordered today   # Chronic diastolic CHF (congestive heart failure) Clinically euvolemic to dry Monitor for fluid overload given IV fluids for  the treatment of DKA Daily weights   # Hypertensive urgency BP 177/108, If able to tolerate, continue home amlodipine  and carvedilol  Hydralazine  IV as needed for additional control   # ESRD on home hemodialysis [facility]) Chronic home hemodialysis Patient dialyzes 3 times a week-no specific days, last session was Sunday 1/25, patient seems noncompliance  with hemodialysis Nephrology consulted for continuation of dialysis   # Chronic painful diabetic neuropathy (HCC) Chronic pain syndrome on chronic opiates Depression Continue oxycodone , pregabalin  and sertraline  Received a dose of Dilaudid  in the ED-Will continue as needed if not tolerating orally   Vitamin D  deficiency: Vit D level 11.1, started Calcitrol 0.5 mcg p.o. daily, follow with PCP to repeat vitamin D  level after 3 to 6 months.   Body mass index is 28.47 kg/m.  Interventions:   Diet: Carb modified diet DVT Prophylaxis: Subcutaneous Heparin     Advance goals of care discussion: Full code  Family Communication: family was not present at bedside, at the time of interview.  The pt provided permission to discuss medical plan with the family. Opportunity was given to ask question and all questions were answered satisfactorily.   Disposition:  Pt is from Home, admitted with DKA and missed HD, s/p Endo tool, still has some electrolyte imbalance and needs PRBC transfusion today, which precludes a safe discharge. Discharge to home, when stable, most likely tomorrow a.m.   Subjective: No significant events overnight.  Patient is complaining of backache and she was concerned about chronic wound on the left foot, which is dry, no tenderness. Due to chronic anemia hemoglobin is low, patient agreed with PRBC transfusion 1 unit today.   Physical Exam: General: NAD, lying comfortably Appear in no distress, affect appropriate Eyes: PERRLA ENT: Oral Mucosa Clear, moist  Neck: no JVD,  Cardiovascular: S1 and S2 Present, no Murmur,  Respiratory: good respiratory effort, Bilateral Air entry equal and Decreased, no Crackles, no wheezes Abdomen: Bowel Sound present, Soft and no tenderness,  Skin: no rashes Extremities: no Pedal edema, no calf tenderness Neurologic: without any new focal findings Gait not checked due to patient safety concerns  Vitals:   12/17/24 0950 12/17/24 0956  12/17/24 1200 12/17/24 1534  BP: (!) 155/97 (!) 155/97 (!) 159/98   Pulse: 88 88 88   Resp:  16 18   Temp: 99.7 F (37.6 C) 99.7 F (37.6 C)  98.6 F (37 C)  TempSrc: Oral Oral  Oral  SpO2: 95% 95% 96%   Weight:      Height:        Intake/Output Summary (Last 24 hours) at 12/17/2024 1556 Last data filed at 12/16/2024 1728 Gross per 24 hour  Intake 35.3 ml  Output --  Net 35.3 ml   Filed Weights   12/15/24 1846  Weight: 80 kg    Data Reviewed: I have personally reviewed and interpreted daily labs, tele strips, imagings as discussed above. I reviewed all nursing notes, pharmacy notes, vitals, pertinent old records I have discussed plan of care as described above with RN and patient/family.  CBC: Recent Labs  Lab 12/15/24 1849 12/16/24 0244 12/16/24 0518 12/17/24 0413  WBC 10.7* 9.1 9.8 5.4  NEUTROABS 9.1*  --   --   --   HGB 9.8* 6.9* 7.5* 7.2*  HCT 30.5* 21.4* 23.5* 22.5*  MCV 85.4 86.3 87.0 87.2  PLT 359 267 322 291   Basic Metabolic Panel: Recent Labs  Lab 12/15/24 1849 12/15/24 2341 12/16/24 0518 12/16/24 0728 12/16/24 1533 12/16/24 2153 12/17/24  0413  NA 135   < > 135 135 134* 135 134*  K 5.8*   < > 5.9* 5.3* 3.6 4.1 4.5  CL 93*   < > 97* 99 95* 95* 96*  CO2 12*   < > 16* 14* 21* 24 24  GLUCOSE 219*   < > 187* 856* 513* 196* 182*  BUN 79*   < > 80* 66* 31* 35* 36*  CREATININE 13.30*   < > 13.30* 10.80* 5.98* 7.71* 8.14*  CALCIUM  8.1*   < > 7.8* 7.2* 8.0* 7.8* 7.4*  MG 2.6*  --   --   --   --   --  2.1  PHOS  --   --   --   --   --   --  5.6*   < > = values in this interval not displayed.    Studies: No results found.   Scheduled Meds:  sodium chloride    Intravenous Once   amLODipine   10 mg Oral Daily   atorvastatin   80 mg Oral Daily   calcitRIOL   0.5 mcg Oral Daily   calcium  acetate  1,334 mg Oral TID WC   carvedilol   6.25 mg Oral BID   epoetin  alfa-epbx (RETACRIT ) injection  4,000 Units Intravenous Q T,Th,Sat-1800   heparin   5,000 Units  Subcutaneous Q8H   insulin  aspart  0-6 Units Subcutaneous Q4H   insulin  aspart  2 Units Subcutaneous TID WC   insulin  glargine-yfgn  12 Units Subcutaneous Q24H   montelukast   10 mg Oral QHS   norethindrone -ethinyl estradiol   1 tablet Oral Daily   pantoprazole  (PROTONIX ) IV  40 mg Intravenous Q12H   pregabalin   50 mg Oral TID   sertraline   50 mg Oral Daily   sodium zirconium cyclosilicate   10 g Oral Once   Continuous Infusions:  insulin  Stopped (12/17/24 0826)   PRN Meds: acetaminophen  **OR** acetaminophen , dextrose , HYDROmorphone  (DILAUDID ) injection, labetalol , ondansetron  (ZOFRAN ) IV, oxyCODONE , prochlorperazine   Time spent: 55 minutes  Author: ELVAN SOR. MD [facility] 12/17/2024 3:56 PM  To reach On-call, see care teams to locate the attending and reach out to them via [email or web address]. If 7PM-7AM, please contact night-coverage If you still have difficulty reaching the attending provider, please page the [facility] (Director on Call) for [facility] on amion for assistance.

## 2024-12-17 NOTE — Progress Notes (Signed)
"  ° °      CROSS COVER NOTE  NAME: Emma Thomas MRN: [id number] DOB : 1996/05/06 ATTENDING PHYSICIAN: Von Bellis, MD    Date of Service   12/17/2024   HPI/Events of Note   Message received from RN This pt is currently on insulin  drip. Please see BMP. CO2 is 24 and Beta Hydroxy 0.09  as well as Her BG has been below 200 and her gap is 16  No communication received from RN regarding any Endotool recommendation.  Attending physician note from today with documented plan to transition when anion gap is closed. At this time Anion gap remains minimally elevated at 16. Most recent CBG's 181 and 203.  Transition Criteria:  Serum bicarbonate greater than/equal to 18 mEq/L [MET] pH greater than 7.3 [MET] Beta-hydroxybutyrate normalized [MET]  Anion gap normalized [not met] Blood glucose less than 200 with target 140-180 [not met]  Interventions   Assessment/Plan: Continue Insulin  Drip with Target blood glucose 140-180 Recheck BMP with AM labs       To reach the provider On-Call:   7AM- 7PM see care teams to locate the attending and reach out to them via [email or web address]. Password: [email or web address] 7PM-7AM contact night-coverage If you still have difficulty reaching the appropriate provider, please page the [facility] (Director on Call) for [facility] on amion for assistance  This document was prepared using Sales executive software and may include unintentional dictation errors.  Rockie Rams  FNP-BC, PMHNP-BC Nurse Practitioner [facility] [place]  "

## 2024-12-17 NOTE — ED Notes (Signed)
 Informed provider that pt refused all PO meds.

## 2024-12-17 NOTE — ED Notes (Addendum)
 Pt signed paper blood consent. Document at bedside and will be turned into medical records scanning once blood completed.

## 2024-12-17 NOTE — ED Notes (Signed)
 Phoslo  will be given after pt wakes up.

## 2024-12-17 NOTE — ED Notes (Signed)
 CCMD called for tele monitoring

## 2024-12-17 NOTE — ED Notes (Signed)
 Pt given 2 orange juices and a cup of ice.

## 2024-12-17 NOTE — Inpatient Diabetes Management (Signed)
 Inpatient Diabetes Program Recommendations  AACE/ADA: New Consensus Statement on Inpatient Glycemic Control (2015)  Target Ranges:  Prepandial:   less than 140 mg/dL      Peak postprandial:   less than 180 mg/dL (1-2 hours)      Critically ill patients:  140 - 180 mg/dL   Lab Results  Component Value Date   GLUCAP 142 (H) 12/17/2024   HGBA1C 7.1 (H) 11/19/2024    Review of Glycemic Control  Latest Reference Range & Units 12/17/24 07:29 12/17/24 08:25  Glucose-Capillary 70 - 99 mg/dL 851 (H) 857 (H)  (H): Data is abnormally high Current orders for Inpatient glycemic control: IV insulin  to transition to Novolog  0-6 units Q4H, Novolog  2 units TID, Glargine 12 units QD  Inpatient Diabetes Program Recommendations:    Noted plan for transition to SQ. In agreement. Can switch Novolog  0-6 units to TID & HS when appropriate.   Reattempted to reach patient by phone, no answer unable to leave voicemail.   Thanks, Tinnie Minus, MSN, RNC-OB Diabetes Coordinator [phone] (8a-5p)

## 2024-12-17 NOTE — Progress Notes (Signed)
"  ° °      CROSS COVER NOTE  NAME: Emma Thomas MRN: [id number] DOB : 07-29-1996 ATTENDING PHYSICIAN: Von Bellis, MD    Notified by nursing that anion gap on a.m. BMP is 14 and ENDOTOOL is recommending that patient be transitioned off of insulin  infusion.  Chart reviewed.  Anion gap was 14, last beta-hydroxybutyric acid was 0.09, last pH 7.35, CO2 24, and most recent POCT CBG 131.  Patient utilizes insulin  pump at home and has already been seen by diabetes educator who recommends 12 units of glargine daily with every 4 hours sensitive scale NovoLog  and 2 units mealtime NovoLog  with every 4 hours POCT CBG monitoring.   Carb modified diet ordered.  Insulin  infusion to be stopped 2 hrs after administration of basal insulin  administration.       To reach the provider On-Call:   7AM- 7PM see care teams to locate the attending and reach out to them via [email or web address]. Password: [email or web address] 7PM-7AM contact night-coverage If you still have difficulty reaching the appropriate provider, please page the [facility] (Director on Call) for [facility] on amion for assistance  This document was prepared using Conservation officer, historic buildings and may include unintentional dictation errors.  Rockie Rams  FNP-BC, PMHNP-BC Nurse Practitioner [facility] [place] "

## 2024-12-18 ENCOUNTER — Other Ambulatory Visit: Payer: Self-pay

## 2024-12-18 LAB — TYPE AND SCREEN
ABO/RH(D): O POS
Antibody Screen: NEGATIVE
Unit division: 0

## 2024-12-18 LAB — CBG MONITORING, ED
Glucose-Capillary: 152 mg/dL — ABNORMAL HIGH (ref 70–99)
Glucose-Capillary: 150 mg/dL — ABNORMAL HIGH (ref 70–99)
Glucose-Capillary: 128 mg/dL — ABNORMAL HIGH (ref 70–99)
Glucose-Capillary: 128 mg/dL — ABNORMAL HIGH (ref 70–99)
Glucose-Capillary: 96 mg/dL (ref 70–99)

## 2024-12-18 LAB — CBC
HCT: 28.3 % — ABNORMAL LOW (ref 36.0–46.0)
Hemoglobin: 8.9 g/dL — ABNORMAL LOW (ref 12.0–15.0)
MCH: 28.1 pg (ref 26.0–34.0)
MCHC: 31.4 g/dL (ref 30.0–36.0)
MCV: 89.3 fL (ref 80.0–100.0)
Platelets: 286 10*3/uL (ref 150–400)
RBC: 3.17 MIL/uL — ABNORMAL LOW (ref 3.87–5.11)
RDW: 14.1 % (ref 11.5–15.5)
WBC: 4.8 10*3/uL (ref 4.0–10.5)
nRBC: 0 % (ref 0.0–0.2)

## 2024-12-18 LAB — BASIC METABOLIC PANEL WITH GFR
Anion gap: 18 — ABNORMAL HIGH (ref 5–15)
BUN: 42 mg/dL — ABNORMAL HIGH (ref 6–20)
CO2: 22 mmol/L (ref 22–32)
Calcium: 7 mg/dL — ABNORMAL LOW (ref 8.9–10.3)
Chloride: 97 mmol/L — ABNORMAL LOW (ref 98–111)
Creatinine, Ser: 9.68 mg/dL — ABNORMAL HIGH (ref 0.44–1.00)
GFR, Estimated: 5 mL/min — ABNORMAL LOW
Glucose, Bld: 154 mg/dL — ABNORMAL HIGH (ref 70–99)
Potassium: 4.5 mmol/L (ref 3.5–5.1)
Sodium: 136 mmol/L (ref 135–145)

## 2024-12-18 LAB — BPAM RBC
Blood Product Expiration Date: 202603032359
ISSUE DATE / TIME: 202602011635
Unit Type and Rh: 5100

## 2024-12-18 LAB — MAGNESIUM: Magnesium: 2.2 mg/dL (ref 1.7–2.4)

## 2024-12-18 LAB — PHOSPHORUS: Phosphorus: 5.9 mg/dL — ABNORMAL HIGH (ref 2.5–4.6)

## 2024-12-18 MED ORDER — CALCITRIOL 0.5 MCG PO CAPS
0.5000 ug | ORAL_CAPSULE | Freq: Every day | ORAL | 3 refills | Status: AC
  Filled 2024-12-18: qty 90, 90d supply, fill #0

## 2024-12-18 MED ORDER — NORETHINDRONE ACET-ETHINYL EST 1-20 MG-MCG PO TABS: 1.0000 | ORAL_TABLET | Freq: Every day | ORAL | Status: DC

## 2024-12-18 NOTE — Inpatient Diabetes Management (Signed)
 Inpatient Diabetes Program Recommendations  AACE/ADA: New Consensus Statement on Inpatient Glycemic Control   Target Ranges:  Prepandial:   less than 140 mg/dL      Peak postprandial:   less than 180 mg/dL (1-2 hours)      Critically ill patients:  140 - 180 mg/dL    Latest Reference Range & Units 12/17/24 08:25 12/17/24 11:40 12/17/24 18:05 12/17/24 21:55 12/18/24 01:11 12/18/24 05:51 12/18/24 07:40  Glucose-Capillary 70 - 99 mg/dL 857 (H) 865 (H) 876 (H) 138 (H) 150 (H) 128 (H) 128 (H)   Review of Glycemic Control  Diabetes history: DM1 Outpatient Diabetes medications: T-Slim pump with Humalog  insulin  Current orders for Inpatient glycemic control: Semglee  12 units Q24H, Novlog 0-6 units Q4H, Novolog  2 units TID with meals  Inpatient Diabetes Program Recommendations:    Insulin : Please consider changing CBGs to AC&HS and Novolog  0-6 units to AC&HS.  NOTE: Patient admitted with DKA. Patient has Type 1 DM, uses an insulin  pump outpatient and is well known to inpatient DM team due to frequent hospital admissions and ED visits. Patient was most recently inpatient 11/19/24-11/22/24 and was seen by inpatient diabetes coordinator on 11/20/24 during that admission.  Would recommend to use SQ insulin  regimen as inpatient and patient can transition back to her insulin  pump after being discharged home.  Patient sees [facility]. Patient had televisit with Jenna Nicole Brothers, PA on 06/27/24 and per office note the following should be patient's insulin  pump settings:   Pump Profile settings - Profile 1 ACTIVE  Time Basal Rate (units/hr) 12:00 AM 0.500 3:00 AM 0.4 6:00 AM 0.600 11:00 AM 0.600 4:00 PM 0.800 9:00 PM 0.600 Total Daily Basal: 14.800 units  Time Correction Factor (units:mg/dL) 87:99 AM 8:34 6:99 AM 1:80 6:00 AM 1:50 11:00 AM 1:50 4:00 PM 1:40 9:00 PM 1:55  Time Carb Ratio (units:grams) 12:00 AM 1:8.0 3:00 AM 1:8.0 6:00 AM 1:8.0 11:00 AM 1:8.0 4:00 PM 1:10 9:00 PM  1:8.0  Time Target BG (mg/dL) 87:99 AM 889 6:99 AM 889 6:00 AM 110 11:00 AM 110 4:00 PM 110 9:00 PM 110   Thanks, Earnie Gainer, RN, MSN, CDCES Diabetes Coordinator Inpatient Diabetes Program [phone] (Team Pager from 8am to 5pm)

## 2024-12-18 NOTE — Progress Notes (Signed)
 [facility] [place], [place] 12/18/24  Subjective:   LOS: 3  Patient known to our practice from outpatient dialysis.  She presents for intractable nausea and vomiting and back pain due to the retching.  She has not had her dialysis for several days now.    According to outpatient records, her last dialysis treatment was on 12/12/2024.  Her target weight is 76.5 kg.  Upon presentation, her blood pressure was elevated.  Glucose was also elevated and she was started on insulin  infusion nephrology consult has been quested to arrange inpatient hemodialysis.  Review of labs indicate her potassium on presentation was 5.9, subsequent potassium 5.3.  Update: Remains ill appearing States she feels some better Room air Abd pain has improved.   Objective:  Vital signs in last 24 hours:  Temp:  [98.1 F (36.7 C)-98.7 F (37.1 C)] 98.7 F (37.1 C) (02/02 0749) Pulse Rate:  [79-91] 91 (02/02 1115) Resp:  [9-22] 22 (02/02 1115) BP: (152-176)/(92-106) 174/96 (02/02 1115) SpO2:  [22 %-100 %] 92 % (02/02 1115)  Weight change:  Filed Weights   12/15/24 1846  Weight: 80 kg    Intake/Output:    Intake/Output Summary (Last 24 hours) at 12/18/2024 1326 Last data filed at 12/17/2024 1946 Gross per 24 hour  Intake 315 ml  Output --  Net 315 ml     Physical Exam: General: Chronically ill-appearing  HEENT Dry oral mucous membranes  Pulm/lungs Normal breathing effort  CVS/Heart Tachycardic  Abdomen:  Nondistended, nontender  Extremities: No significant peripheral edema  Neurologic: Alert, able to answer simple questions and follow commands  Skin: No acute rashes  Access: Right upper arm AV fistula       Basic Metabolic Panel:  Recent Labs  Lab 12/15/24 1849 12/15/24 2341 12/16/24 0728 12/16/24 1533 12/16/24 2153 12/17/24 0413 12/18/24 0403  NA 135   < > 135 134* 135 134* 136  K 5.8*   < > 5.3* 3.6 4.1 4.5 4.5  CL 93*   < > 99 95* 95* 96* 97*  CO2 12*   < >  14* 21* 24 24 22   GLUCOSE 219*   < > 856* 513* 196* 182* 154*  BUN 79*   < > 66* 31* 35* 36* 42*  CREATININE 13.30*   < > 10.80* 5.98* 7.71* 8.14* 9.68*  CALCIUM  8.1*   < > 7.2* 8.0* 7.8* 7.4* 7.0*  MG 2.6*  --   --   --   --  2.1 2.2  PHOS  --   --   --   --   --  5.6* 5.9*   < > = values in this interval not displayed.     CBC: Recent Labs  Lab 12/15/24 1849 12/16/24 0244 12/16/24 0518 12/17/24 0413 12/18/24 0403  WBC 10.7* 9.1 9.8 5.4 4.8  NEUTROABS 9.1*  --   --   --   --   HGB 9.8* 6.9* 7.5* 7.2* 8.9*  HCT 30.5* 21.4* 23.5* 22.5* 28.3*  MCV 85.4 86.3 87.0 87.2 89.3  PLT 359 267 322 291 286      Lab Results  Component Value Date   HEPBSAG NON REACTIVE 11/20/2024      Microbiology:  No results found for this or any previous visit (from the past 240 hours).  Coagulation Studies: No results for input(s): LABPROT, INR in the last 72 hours.  Urinalysis: Recent Labs    12/16/24 0550  COLORURINE AMBER*  LABSPEC 1.024  PHURINE 7.0  GLUCOSEU >=500*  HGBUR MODERATE*  BILIRUBINUR NEGATIVE  KETONESUR 5*  PROTEINUR >=300*  NITRITE NEGATIVE  LEUKOCYTESUR NEGATIVE      Imaging: No results found.    Medications:    insulin  Stopped (12/17/24 0826)    amLODipine   10 mg Oral Daily   atorvastatin   80 mg Oral Daily   calcitRIOL   0.5 mcg Oral Daily   calcium  acetate  1,334 mg Oral TID WC   carvedilol   6.25 mg Oral BID   epoetin  alfa-epbx (RETACRIT ) injection  4,000 Units Intravenous Q T,Th,Sat-1800   heparin   5,000 Units Subcutaneous Q8H   insulin  aspart  0-6 Units Subcutaneous Q4H   insulin  aspart  2 Units Subcutaneous TID WC   insulin  glargine-yfgn  12 Units Subcutaneous Q24H   montelukast   10 mg Oral QHS   norethindrone -ethinyl estradiol   1 tablet Oral Daily   pantoprazole  (PROTONIX ) IV  40 mg Intravenous Q12H   pregabalin   50 mg Oral TID   sertraline   50 mg Oral Daily   sodium zirconium cyclosilicate   10 g Oral Once   acetaminophen  **OR**  acetaminophen , dextrose , HYDROmorphone  (DILAUDID ) injection, labetalol , ondansetron  (ZOFRAN ) IV, oxyCODONE , prochlorperazine   Assessment/ Plan:  29 y.o. female with past medical history of diabetes type 1, polyneuropathy, diastolic CHF and end stage renal disease on home hemodialysis dialysis, previous admissions for DKA was admitted on 12/15/2024 for  Principal Problem:   Diabetic ketoacidosis associated with type 1 diabetes mellitus (HCC) Active Problems:   Depression   Intractable vomiting   ESRD on home hemodialysis (HCC)   Anemia of chronic disease   Chronic painful diabetic neuropathy (HCC)   Hypertensive urgency   Chronic home hemodialysis status   Chronic diastolic CHF (congestive heart failure) (HCC)   Chronic pain syndrome   Acute on chronic anemia   Hyperkalemia   Leukocytosis   Acute back pain  Diabetic ketoacidosis associated with type 1 diabetes mellitus (HCC) [E10.10]  #.  Intractable nausea, vomiting, hyperkalemia in the setting of ESRD Outpatient dialysis: Home hemodialysis/[place] at home/MWFS/target weight 76.5 kg/Right upper arm AV fistula Denies nausea or vomiting. Scheduled for dialysis today however due to large volume, patient agreeable to discharge and perform dialysis at home. Primary team notified.   #. Anemia of CKD  Lab Results  Component Value Date   HGB 8.9 (L) 12/18/2024   Continue low dose EPO with HD  #. Secondary hyperparathyroidism of renal origin N 25.81   No results found for: PTH Lab Results  Component Value Date   PHOS 5.9 (H) 12/18/2024   Chronic hypocalcemia, receives calcitriol  and calcium  acetate with meals.    #. Diabetes type 2 with CKD Hgb A1c MFr Bld (%)  Date Value  11/19/2024 7.1 (H)  Insulin  drip stopped.    LOS: 3 Emma Thomas 2/2/20261:26 PM  [address] [place], [place] [phone]

## 2024-12-19 NOTE — Progress Notes (Signed)
 D/C order noted. Contacted [facility] to be advised of pt and d/c. Requested documents faxed to clinic for continuation of care.  Suzen Satchel Dialysis Navigator [phone]

## 2025-02-19 ENCOUNTER — Encounter: Payer: Self-pay | Admitting: Nurse Practitioner
